# Patient Record
Sex: Male | Born: 1940 | Race: White | Hispanic: No | Marital: Married | Smoking: Former smoker
Health system: Southern US, Community
[De-identification: ages and names within clinical notes are randomized; demographics above are authoritative.]

## PROBLEM LIST (undated history)

## (undated) DIAGNOSIS — I739 Peripheral vascular disease, unspecified: Secondary | ICD-10-CM

## (undated) DIAGNOSIS — G8929 Other chronic pain: Secondary | ICD-10-CM

## (undated) DIAGNOSIS — K219 Gastro-esophageal reflux disease without esophagitis: Secondary | ICD-10-CM

## (undated) DIAGNOSIS — I10 Essential (primary) hypertension: Secondary | ICD-10-CM

## (undated) DIAGNOSIS — R7303 Prediabetes: Secondary | ICD-10-CM

## (undated) DIAGNOSIS — E7800 Pure hypercholesterolemia, unspecified: Secondary | ICD-10-CM

## (undated) DIAGNOSIS — C679 Malignant neoplasm of bladder, unspecified: Secondary | ICD-10-CM

## (undated) DIAGNOSIS — T50905A Adverse effect of unspecified drugs, medicaments and biological substances, initial encounter: Secondary | ICD-10-CM

## (undated) DIAGNOSIS — K5909 Other constipation: Secondary | ICD-10-CM

## (undated) DIAGNOSIS — F419 Anxiety disorder, unspecified: Secondary | ICD-10-CM

## (undated) DIAGNOSIS — M159 Polyosteoarthritis, unspecified: Secondary | ICD-10-CM

## (undated) DIAGNOSIS — H269 Unspecified cataract: Secondary | ICD-10-CM

## (undated) DIAGNOSIS — D509 Iron deficiency anemia, unspecified: Secondary | ICD-10-CM

## (undated) DIAGNOSIS — M51369 Other intervertebral disc degeneration, lumbar region without mention of lumbar back pain or lower extremity pain: Secondary | ICD-10-CM

## (undated) DIAGNOSIS — J929 Pleural plaque without asbestos: Secondary | ICD-10-CM

## (undated) DIAGNOSIS — I2510 Atherosclerotic heart disease of native coronary artery without angina pectoris: Secondary | ICD-10-CM

## (undated) DIAGNOSIS — Z860101 Personal history of adenomatous and serrated colon polyps: Secondary | ICD-10-CM

## (undated) DIAGNOSIS — Z86010 Personal history of colonic polyps: Secondary | ICD-10-CM

## (undated) DIAGNOSIS — M5136 Other intervertebral disc degeneration, lumbar region: Secondary | ICD-10-CM

## (undated) DIAGNOSIS — M545 Low back pain, unspecified: Secondary | ICD-10-CM

## (undated) DIAGNOSIS — M79606 Pain in leg, unspecified: Secondary | ICD-10-CM

## (undated) DIAGNOSIS — R001 Bradycardia, unspecified: Secondary | ICD-10-CM

## (undated) DIAGNOSIS — R062 Wheezing: Secondary | ICD-10-CM

## (undated) DIAGNOSIS — C4490 Unspecified malignant neoplasm of skin, unspecified: Secondary | ICD-10-CM

## (undated) DIAGNOSIS — Z85828 Personal history of other malignant neoplasm of skin: Secondary | ICD-10-CM

## (undated) HISTORY — DX: Polyosteoarthritis, unspecified: M15.9

## (undated) HISTORY — DX: Anxiety disorder, unspecified: F41.9

## (undated) HISTORY — DX: Pleural plaque without asbestos: J92.9

## (undated) HISTORY — DX: Low back pain, unspecified: M54.50

## (undated) HISTORY — DX: Adverse effect of unspecified drugs, medicaments and biological substances, initial encounter: T50.905A

## (undated) HISTORY — DX: Atherosclerotic heart disease of native coronary artery without angina pectoris: I25.10

## (undated) HISTORY — DX: Personal history of other malignant neoplasm of skin: Z85.828

## (undated) HISTORY — PX: CARDIOVASCULAR STRESS TEST: SHX262

## (undated) HISTORY — PX: OTHER SURGICAL HISTORY: SHX169

## (undated) HISTORY — PX: COLONOSCOPY: SHX174

## (undated) HISTORY — DX: Other intervertebral disc degeneration, lumbar region without mention of lumbar back pain or lower extremity pain: M51.369

## (undated) HISTORY — DX: Iron deficiency anemia, unspecified: D50.9

## (undated) HISTORY — DX: Peripheral vascular disease, unspecified: I73.9

## (undated) HISTORY — DX: Wheezing: R06.2

## (undated) HISTORY — DX: Pure hypercholesterolemia, unspecified: E78.00

## (undated) HISTORY — DX: Other intervertebral disc degeneration, lumbar region: M51.36

## (undated) HISTORY — DX: Prediabetes: R73.03

## (undated) HISTORY — DX: Essential (primary) hypertension: I10

## (undated) HISTORY — DX: Bradycardia, unspecified: R00.1

## (undated) HISTORY — PX: CATARACT EXTRACTION, BILATERAL: SHX1313

## (undated) HISTORY — DX: Other constipation: K59.09

## (undated) HISTORY — DX: Unspecified malignant neoplasm of skin, unspecified: C44.90

## (undated) HISTORY — DX: Gastro-esophageal reflux disease without esophagitis: K21.9

## (undated) HISTORY — DX: Pain in leg, unspecified: M79.606

## (undated) HISTORY — DX: Low back pain: M54.5

## (undated) HISTORY — DX: Personal history of adenomatous and serrated colon polyps: Z86.0101

## (undated) HISTORY — DX: Other chronic pain: G89.29

## (undated) HISTORY — DX: Personal history of colonic polyps: Z86.010

---
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
## 2003-02-22 ENCOUNTER — Encounter: Admission: RE | Admit: 2003-02-22 | Discharge: 2003-02-22 | Payer: Self-pay | Admitting: Neurosurgery

## 2003-02-22 ENCOUNTER — Encounter: Payer: Self-pay | Admitting: Neurosurgery

## 2003-03-02 ENCOUNTER — Ambulatory Visit (HOSPITAL_COMMUNITY): Admission: RE | Admit: 2003-03-02 | Discharge: 2003-03-02 | Payer: Self-pay | Admitting: Neurosurgery

## 2003-03-02 ENCOUNTER — Encounter: Payer: Self-pay | Admitting: Neurosurgery

## 2003-03-09 ENCOUNTER — Encounter: Admission: RE | Admit: 2003-03-09 | Discharge: 2003-03-09 | Payer: Self-pay | Admitting: Neurosurgery

## 2003-03-09 ENCOUNTER — Encounter: Payer: Self-pay | Admitting: Neurosurgery

## 2003-03-13 ENCOUNTER — Encounter: Admission: RE | Admit: 2003-03-13 | Discharge: 2003-03-30 | Payer: Self-pay | Admitting: Neurosurgery

## 2004-05-05 ENCOUNTER — Emergency Department (HOSPITAL_COMMUNITY): Admission: EM | Admit: 2004-05-05 | Discharge: 2004-05-05 | Payer: Self-pay | Admitting: Emergency Medicine

## 2004-09-05 ENCOUNTER — Ambulatory Visit: Payer: Self-pay | Admitting: Pulmonary Disease

## 2004-11-06 ENCOUNTER — Encounter: Admission: RE | Admit: 2004-11-06 | Discharge: 2004-11-06 | Payer: Self-pay | Admitting: Neurosurgery

## 2005-02-27 ENCOUNTER — Ambulatory Visit: Payer: Self-pay | Admitting: Pulmonary Disease

## 2005-05-07 ENCOUNTER — Ambulatory Visit: Payer: Self-pay | Admitting: Pulmonary Disease

## 2005-05-25 ENCOUNTER — Ambulatory Visit: Payer: Self-pay | Admitting: Pulmonary Disease

## 2005-06-02 ENCOUNTER — Ambulatory Visit: Payer: Self-pay | Admitting: Cardiology

## 2005-12-22 ENCOUNTER — Ambulatory Visit: Payer: Self-pay | Admitting: Pulmonary Disease

## 2006-04-08 ENCOUNTER — Ambulatory Visit (HOSPITAL_COMMUNITY): Admission: RE | Admit: 2006-04-08 | Discharge: 2006-04-08 | Payer: Self-pay | Admitting: Otolaryngology

## 2006-04-08 ENCOUNTER — Encounter: Payer: Self-pay | Admitting: Vascular Surgery

## 2006-04-22 ENCOUNTER — Encounter: Admission: RE | Admit: 2006-04-22 | Discharge: 2006-04-22 | Payer: Self-pay | Admitting: Otolaryngology

## 2006-04-23 ENCOUNTER — Encounter: Admission: RE | Admit: 2006-04-23 | Discharge: 2006-04-23 | Payer: Self-pay | Admitting: Otolaryngology

## 2006-05-26 ENCOUNTER — Ambulatory Visit: Payer: Self-pay | Admitting: Pulmonary Disease

## 2007-05-27 ENCOUNTER — Ambulatory Visit: Payer: Self-pay | Admitting: Pulmonary Disease

## 2007-05-27 LAB — CONVERTED CEMR LAB
ALT: 19 units/L (ref 0–53)
AST: 25 units/L (ref 0–37)
Albumin: 4.6 g/dL (ref 3.5–5.2)
Alkaline Phosphatase: 51 units/L (ref 39–117)
BUN: 9 mg/dL (ref 6–23)
Basophils Absolute: 0 10*3/uL (ref 0.0–0.1)
Basophils Relative: 0.4 % (ref 0.0–1.0)
Bilirubin, Direct: 0.2 mg/dL (ref 0.0–0.3)
CO2: 34 meq/L — ABNORMAL HIGH (ref 19–32)
Calcium: 9.5 mg/dL (ref 8.4–10.5)
Chloride: 109 meq/L (ref 96–112)
Cholesterol: 124 mg/dL (ref 0–200)
Creatinine, Ser: 0.9 mg/dL (ref 0.4–1.5)
Eosinophils Absolute: 0.2 10*3/uL (ref 0.0–0.6)
Eosinophils Relative: 2.3 % (ref 0.0–5.0)
GFR calc Af Amer: 109 mL/min
GFR calc non Af Amer: 90 mL/min
Glucose, Bld: 92 mg/dL (ref 70–99)
HCT: 46.4 % (ref 39.0–52.0)
HDL: 40.8 mg/dL (ref >39.0)
Hemoglobin: 16.4 g/dL (ref 13.0–17.0)
LDL Cholesterol: 52 mg/dL (ref 0–99)
Lymphocytes Relative: 31.2 % (ref 12.0–46.0)
MCHC: 35.3 g/dL (ref 30.0–36.0)
MCV: 90 fL (ref 78.0–100.0)
Monocytes Absolute: 0.6 10*3/uL (ref 0.2–0.7)
Monocytes Relative: 6.7 % (ref 3.0–11.0)
Neutro Abs: 5.1 10*3/uL (ref 1.4–7.7)
Neutrophils Relative %: 59.4 % (ref 43.0–77.0)
PSA: 1.85 ng/mL (ref 0.10–4.00)
Platelets: 179 10*3/uL (ref 150–400)
Potassium: 4.4 meq/L (ref 3.5–5.1)
RBC: 5.15 M/uL (ref 4.22–5.81)
RDW: 12.6 % (ref 11.5–14.6)
Sodium: 148 meq/L — ABNORMAL HIGH (ref 135–145)
TSH: 2.44 microintl units/mL (ref 0.35–5.50)
Total Bilirubin: 1.2 mg/dL (ref 0.3–1.2)
Total CHOL/HDL Ratio: 3
Total Protein: 7 g/dL (ref 6.0–8.3)
Triglycerides: 155 mg/dL — ABNORMAL HIGH (ref 0–149)
VLDL: 31 mg/dL (ref 0–40)
WBC: 8.6 10*3/uL (ref 4.5–10.5)

## 2007-07-10 ENCOUNTER — Encounter: Admission: RE | Admit: 2007-07-10 | Discharge: 2007-07-10 | Payer: Self-pay | Admitting: Neurosurgery

## 2007-08-09 ENCOUNTER — Ambulatory Visit: Payer: Self-pay | Admitting: Pulmonary Disease

## 2007-08-19 ENCOUNTER — Encounter: Payer: Self-pay | Admitting: Pulmonary Disease

## 2007-11-17 ENCOUNTER — Encounter: Payer: Self-pay | Admitting: Pulmonary Disease

## 2008-03-29 DIAGNOSIS — E7800 Pure hypercholesterolemia, unspecified: Secondary | ICD-10-CM | POA: Insufficient documentation

## 2008-03-29 DIAGNOSIS — M545 Low back pain, unspecified: Secondary | ICD-10-CM | POA: Insufficient documentation

## 2008-03-29 DIAGNOSIS — D126 Benign neoplasm of colon, unspecified: Secondary | ICD-10-CM | POA: Insufficient documentation

## 2008-03-29 DIAGNOSIS — M109 Gout, unspecified: Secondary | ICD-10-CM | POA: Insufficient documentation

## 2008-03-29 DIAGNOSIS — F411 Generalized anxiety disorder: Secondary | ICD-10-CM | POA: Insufficient documentation

## 2008-03-29 DIAGNOSIS — I10 Essential (primary) hypertension: Secondary | ICD-10-CM | POA: Insufficient documentation

## 2008-03-29 DIAGNOSIS — IMO0002 Reserved for concepts with insufficient information to code with codable children: Secondary | ICD-10-CM | POA: Insufficient documentation

## 2008-03-29 HISTORY — DX: Benign neoplasm of colon, unspecified: D12.6

## 2008-04-26 ENCOUNTER — Telehealth: Payer: Self-pay | Admitting: Pulmonary Disease

## 2008-05-16 ENCOUNTER — Encounter: Payer: Self-pay | Admitting: Pulmonary Disease

## 2008-06-21 ENCOUNTER — Ambulatory Visit: Payer: Self-pay | Admitting: Pulmonary Disease

## 2008-06-21 DIAGNOSIS — H9190 Unspecified hearing loss, unspecified ear: Secondary | ICD-10-CM | POA: Insufficient documentation

## 2008-06-21 DIAGNOSIS — I739 Peripheral vascular disease, unspecified: Secondary | ICD-10-CM | POA: Insufficient documentation

## 2008-06-21 DIAGNOSIS — Z85828 Personal history of other malignant neoplasm of skin: Secondary | ICD-10-CM | POA: Insufficient documentation

## 2008-06-21 DIAGNOSIS — J309 Allergic rhinitis, unspecified: Secondary | ICD-10-CM | POA: Insufficient documentation

## 2008-06-21 LAB — CONVERTED CEMR LAB
ALT: 33 units/L (ref 0–53)
AST: 29 units/L (ref 0–37)
BUN: 8 mg/dL (ref 6–23)
Basophils Absolute: 0 10*3/uL (ref 0.0–0.1)
Bilirubin, Direct: 0.3 mg/dL (ref 0.0–0.3)
CO2: 28 meq/L (ref 19–32)
Calcium: 9.5 mg/dL (ref 8.4–10.5)
Chloride: 108 meq/L (ref 96–112)
Eosinophils Relative: 2.3 % (ref 0.0–5.0)
GFR calc Af Amer: 109 mL/min
GFR calc non Af Amer: 90 mL/min
HCT: 46.6 % (ref 39.0–52.0)
Lymphocytes Relative: 30.3 % (ref 12.0–46.0)
MCHC: 35.6 g/dL (ref 30.0–36.0)
MCV: 91.9 fL (ref 78.0–100.0)
Monocytes Absolute: 0.4 10*3/uL (ref 0.1–1.0)
Monocytes Relative: 4.8 % (ref 3.0–12.0)
Neutro Abs: 5 10*3/uL (ref 1.4–7.7)
Neutrophils Relative %: 62.1 % (ref 43.0–77.0)
RBC: 5.07 M/uL (ref 4.22–5.81)
TSH: 1.81 microintl units/mL (ref 0.35–5.50)
Total Bilirubin: 1.1 mg/dL (ref 0.3–1.2)
Total CHOL/HDL Ratio: 3.2
Total Protein: 7.1 g/dL (ref 6.0–8.3)
VLDL: 31 mg/dL (ref 0–40)
WBC: 8.1 10*3/uL (ref 4.5–10.5)

## 2008-08-01 ENCOUNTER — Telehealth (INDEPENDENT_AMBULATORY_CARE_PROVIDER_SITE_OTHER): Payer: Self-pay | Admitting: *Deleted

## 2008-08-20 ENCOUNTER — Ambulatory Visit: Payer: Self-pay | Admitting: Gastroenterology

## 2008-09-06 ENCOUNTER — Ambulatory Visit: Payer: Self-pay | Admitting: Gastroenterology

## 2008-11-12 ENCOUNTER — Ambulatory Visit: Payer: Self-pay | Admitting: Pulmonary Disease

## 2008-11-28 ENCOUNTER — Encounter: Payer: Self-pay | Admitting: Pulmonary Disease

## 2009-01-11 LAB — CONVERTED CEMR LAB: Triglycerides: 173 mg/dL — ABNORMAL HIGH (ref 0–149)

## 2009-01-18 ENCOUNTER — Encounter: Payer: Self-pay | Admitting: Pulmonary Disease

## 2009-05-20 ENCOUNTER — Encounter: Payer: Self-pay | Admitting: Pulmonary Disease

## 2009-06-20 ENCOUNTER — Ambulatory Visit: Payer: Self-pay | Admitting: Pulmonary Disease

## 2009-06-20 ENCOUNTER — Encounter: Payer: Self-pay | Admitting: Cardiology

## 2009-06-20 DIAGNOSIS — R079 Chest pain, unspecified: Secondary | ICD-10-CM | POA: Insufficient documentation

## 2009-06-24 ENCOUNTER — Ambulatory Visit: Payer: Self-pay | Admitting: Cardiology

## 2009-06-24 ENCOUNTER — Ambulatory Visit: Payer: Self-pay | Admitting: Pulmonary Disease

## 2009-06-24 ENCOUNTER — Telehealth: Payer: Self-pay | Admitting: Pulmonary Disease

## 2009-06-26 ENCOUNTER — Telehealth (INDEPENDENT_AMBULATORY_CARE_PROVIDER_SITE_OTHER): Payer: Self-pay | Admitting: Radiology

## 2009-06-27 ENCOUNTER — Encounter: Payer: Self-pay | Admitting: Cardiology

## 2009-06-27 ENCOUNTER — Ambulatory Visit: Payer: Self-pay

## 2009-06-27 LAB — CONVERTED CEMR LAB
AST: 20 units/L (ref 0–37)
Albumin: 4.4 g/dL (ref 3.5–5.2)
Basophils Absolute: 0 10*3/uL (ref 0.0–0.1)
Calcium: 9.3 mg/dL (ref 8.4–10.5)
Chloride: 110 meq/L (ref 96–112)
GFR calc non Af Amer: 89.61 mL/min (ref >60)
Glucose, Bld: 83 mg/dL (ref 70–99)
HCT: 46.4 % (ref 39.0–52.0)
HDL: 41.1 mg/dL (ref >39.00)
Hemoglobin: 16 g/dL (ref 13.0–17.0)
Lymphocytes Relative: 31 % (ref 12.0–46.0)
MCHC: 34.6 g/dL (ref 30.0–36.0)
MCV: 93.5 fL (ref 78.0–100.0)
Monocytes Absolute: 0.4 10*3/uL (ref 0.1–1.0)
Monocytes Relative: 5.7 % (ref 3.0–12.0)
Neutrophils Relative %: 61.1 % (ref 43.0–77.0)
PSA: 1.35 ng/mL (ref 0.10–4.00)
RBC: 4.96 M/uL (ref 4.22–5.81)
TSH: 2.01 microintl units/mL (ref 0.35–5.50)
Total Bilirubin: 0.9 mg/dL (ref 0.3–1.2)
Total Protein: 7 g/dL (ref 6.0–8.3)
VLDL: 32 mg/dL (ref 0.0–40.0)
WBC: 7.3 10*3/uL (ref 4.5–10.5)

## 2009-07-08 ENCOUNTER — Ambulatory Visit: Payer: Self-pay | Admitting: Cardiology

## 2009-07-15 LAB — CONVERTED CEMR LAB
CO2: 33 meq/L — ABNORMAL HIGH (ref 19–32)
Calcium: 9.3 mg/dL (ref 8.4–10.5)
Chloride: 103 meq/L (ref 96–112)
Creatinine, Ser: 1 mg/dL (ref 0.4–1.5)
Potassium: 4.2 meq/L (ref 3.5–5.1)

## 2009-07-19 ENCOUNTER — Ambulatory Visit: Payer: Self-pay | Admitting: Pulmonary Disease

## 2009-07-23 ENCOUNTER — Encounter (INDEPENDENT_AMBULATORY_CARE_PROVIDER_SITE_OTHER): Payer: Self-pay | Admitting: *Deleted

## 2009-07-24 ENCOUNTER — Ambulatory Visit: Payer: Self-pay | Admitting: Cardiology

## 2009-07-29 ENCOUNTER — Ambulatory Visit: Payer: Self-pay

## 2009-08-07 ENCOUNTER — Ambulatory Visit: Payer: Self-pay | Admitting: Cardiology

## 2009-08-08 LAB — CONVERTED CEMR LAB
Calcium: 9 mg/dL (ref 8.4–10.5)
Creatinine, Ser: 1 mg/dL (ref 0.4–1.5)
Glucose, Bld: 106 mg/dL — ABNORMAL HIGH (ref 70–99)
Potassium: 3.7 meq/L (ref 3.5–5.1)

## 2009-09-11 ENCOUNTER — Encounter: Payer: Self-pay | Admitting: Pulmonary Disease

## 2009-10-10 ENCOUNTER — Telehealth (INDEPENDENT_AMBULATORY_CARE_PROVIDER_SITE_OTHER): Payer: Self-pay | Admitting: *Deleted

## 2009-10-23 ENCOUNTER — Ambulatory Visit: Payer: Self-pay | Admitting: Pulmonary Disease

## 2009-11-05 ENCOUNTER — Telehealth: Payer: Self-pay | Admitting: Pulmonary Disease

## 2010-02-05 ENCOUNTER — Encounter: Payer: Self-pay | Admitting: Pulmonary Disease

## 2010-02-20 ENCOUNTER — Ambulatory Visit: Payer: Self-pay | Admitting: Pulmonary Disease

## 2010-02-24 LAB — CONVERTED CEMR LAB
AST: 27 units/L (ref 0–37)
Albumin: 4.4 g/dL (ref 3.5–5.2)
BUN: 13 mg/dL (ref 6–23)
CO2: 32 meq/L (ref 19–32)
Chloride: 104 meq/L (ref 96–112)
GFR calc non Af Amer: 61.21 mL/min (ref >60)
Glucose, Bld: 84 mg/dL (ref 70–99)
HDL: 44.7 mg/dL (ref >39.00)
LDL Cholesterol: 74 mg/dL (ref 0–99)
Potassium: 4.2 meq/L (ref 3.5–5.1)
Total Protein: 6.6 g/dL (ref 6.0–8.3)
Triglycerides: 183 mg/dL — ABNORMAL HIGH (ref 0.0–149.0)

## 2010-07-30 ENCOUNTER — Encounter: Payer: Self-pay | Admitting: Pulmonary Disease

## 2010-08-19 ENCOUNTER — Ambulatory Visit: Payer: Self-pay | Admitting: Pulmonary Disease

## 2010-08-19 ENCOUNTER — Telehealth (INDEPENDENT_AMBULATORY_CARE_PROVIDER_SITE_OTHER): Payer: Self-pay | Admitting: *Deleted

## 2010-08-25 LAB — CONVERTED CEMR LAB
ALT: 38 units/L (ref 0–53)
Albumin: 4.9 g/dL (ref 3.5–5.2)
BUN: 23 mg/dL (ref 6–23)
Basophils Absolute: 0.1 10*3/uL (ref 0.0–0.1)
Creatinine, Ser: 1 mg/dL (ref 0.4–1.5)
Eosinophils Absolute: 0 10*3/uL (ref 0.0–0.7)
Eosinophils Relative: 0.1 % (ref 0.0–5.0)
HCT: 46.1 % (ref 39.0–52.0)
Hemoglobin: 15.8 g/dL (ref 13.0–17.0)
Iron: 200 ug/dL — ABNORMAL HIGH (ref 42–165)
Lymphocytes Relative: 24 % (ref 12.0–46.0)
Lymphs Abs: 3.2 10*3/uL (ref 0.7–4.0)
MCHC: 34.3 g/dL (ref 30.0–36.0)
Monocytes Absolute: 0.7 10*3/uL (ref 0.1–1.0)
Neutro Abs: 9.3 10*3/uL — ABNORMAL HIGH (ref 1.4–7.7)
PSA: 2.13 ng/mL (ref 0.10–4.00)
Potassium: 4.2 meq/L (ref 3.5–5.1)
RDW: 13.4 % (ref 11.5–14.6)
Saturation Ratios: 44.7 % (ref 20.0–50.0)
Transferrin: 319.6 mg/dL (ref 212.0–360.0)

## 2010-08-29 ENCOUNTER — Encounter: Payer: Self-pay | Admitting: Pulmonary Disease

## 2010-08-29 DIAGNOSIS — K219 Gastro-esophageal reflux disease without esophagitis: Secondary | ICD-10-CM | POA: Insufficient documentation

## 2010-10-19 ENCOUNTER — Encounter: Payer: Self-pay | Admitting: Otolaryngology

## 2010-10-30 NOTE — Assessment & Plan Note (Signed)
Summary: 3-4 months/.apc   Primary Care Provider:  Alroy Dust, MD  CC:  4 month ROV & review of mult medical problems....  History of Present Illness: 70 y/o WM here for a follow up visit... he has multiple medical problems as noted below...    ~  June 20, 2009:  pt last seen 12yr ago for f/u HBP, Chol, vasc dis- on Atenolol, Diovan, changed to Simvastatin & ASA... his main prob has been LBP- followed by DrHirsh & treated w/ epid shots, Diclofenac, Tramadol, Cyclobenz... pt presents w/ 1d hx of intermittent sharp electric shock-like CP from right to left across his chest... the pain occurs in bursts of 2-3, lasting seconds only & resolves spont... the pain is not activity or motion related, no radiation to jaw or arm, no assoc n/v/diaphoresis, etc... he tried Tums + his other meds noted... he notes some assoc weakness, & sl headache... he denies similar pain in the past & RISK FACTORS include: +male/ age68, +FamHx, +HBP, +Chol, Ex-smoker quit 2000, -DM...  NOTE: known calcif seen prev in coronaries on CTChest 2006, and calcif in the carotid bifurcations on CTNeck in 2007...   ~  July 19, 2009:  he saw [facility] 9[facility]10 w/ atyp CP- myoview done and neg- BP was sl elevated and Diovan increased... BP improved, Chol stable on Simva40, recent CXR/ Labs reviewed... OK Pneumovax.   ~  October 23, 2009:  he had incr LBP- saw DrHirsh w/ epid steroid shot & improved... also has Diclofenac, Vicodin, Flexeril...  saw DrMcLean 10/10 for f/u CP, HBP, Chol- added HCTZ25mg /d...    Current Problem List:  HEARING LOSS (ICD-389.9) - eval by DrBates for ENT...  ALLERGIC RHINITIS (ICD-477.9) - eval by DrESL on allergy shots 2x/wk... & FLONASE as needed.  HYPERTENSION (ICD-401.9) - controlled on ATENOLOL 50mg /d,  DIOVAN 320mg /d, & HCTZ 25mg /d... BP= 128/80 today, takes meds regularly, tolerates well... states BP checks at home are similar to this... denies HA, fatigue, visual changes, CP, palipit, dizziness,  syncope, dyspnea, edema, etc...  ~  note: coronary art calcif seen on prev CT Chest in 2006...  ~  CXR 9/10 showed chr asymmetric pl thickening right apex, calcif Ao, NAD...  CHEST PAIN (ICD-786.50) - Apypical CP eval 9/10...  ~  baseline EKG shows NSR, WNL.Marland Kitchen.  ~  Cards eval DrMcLean 9/10 was neg... BP sl elevated & Diovan increased.  ~  Myoview 9/10 showed no ischemia, no scar, EF= 58%...  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 he has known atherosclerotic changes seen on prev CT Neck 7/07 in the carotids w/o focal stenosis... note: coronary art calcif seen on prev CT Chest in 2006...  HYPERCHOLESTEROLEMIA (ICD-272.0) - prev on Vytorin 10/20 & switched to SIMVASTATIN 40mg /d in 2009 for $$ reasons...   ~  FLP 8/08 on Vytor10-20 showed TChol 124, TG 155, HDL 41, LDL 52  ~  FLP 9/09 on Vytor10-20 showed TChol 132, TG 154, HDL 41, LDL 60  ~  FLP 2/10 on Simva40 showed TChol 146, TG 173, HDL 36, LDL 75  ~  FLP 9/10 on Simva40 showed TChol 137, TG 160, HDL 41, LDL 64  COLONIC POLYPS (ICD-211.3)  ~  colonoscopy 10/04 by Dorris Singh showed several 1-5mm polyps = hyperplastic & one frag adenomatous...   ~  f/u colon 12/09 was neg- no polyps seen & f/u rec 50yrs...  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 he has degen disc disease  at all levels of the lumbar spine... s/p shots, no surgery... currently taking DICLOFENAC 75mg Bid,  VICODIN Tid Prn, & CYCLOBENZAPRINE 10mg Bid Prn...  ~  Fall10:  he has seen DrHirsh every few months & received several epid shots w/ temp relief...  GOUT (ICD-274.9) - on ALLOPURINOL 300mg /d...   ~  labs 9/09 showed UricAcid level= 4.7  ANXIETY (ICD-300.00)  BASAL CELL CARCINOMA, HX OF (ICD-V10.83) - s/p removal of 2 basal cell Ca's from neck and left arm by DrTaffeen...  HEALTH MAINTENANCE:    ~  colon- done by drKaplan 12/09 & neg- f/u 70yrs.  ~  GU- PSA 9/10 was normal at  1.35  ~  Immunization- OK PNEUMOVAX 10/10 age 18... OK 2010 Flu shot today as well.   Allergies (verified): No Known Drug Allergies  Comments:  Nurse/Medical Assistant: The patient's medications and allergies were reviewed with the patient and were updated in the Medication and Allergy Lists.  Past History:  Past Medical History: 1. CHEST PAIN (ICD-786.50): Coronary calcification noted on CT chest in 2006. Adenosine myoview (10/10): EF 58%, no evidence for ischemia or infarction.  2. HYPERTENSION (ICD-401.9) 3. HYPERCHOLESTEROLEMIA (ICD-272.0) 4. ANXIETY (ICD-300.00) 5. HEARING LOSS (ICD-389.9) 6. ALLERGIC RHINITIS (ICD-477.9) 8. COLONIC POLYPS (ICD-211.3) 9. 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 10. 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 BASAL CELL CARCINOMA, HX OF (ICD-V10.83)  Family History: Reviewed history from 06/21/2009 and no changes required. Father died age 65 w/ lung cancer Mother died age 22 w/ CHF 3 Siblings:   2 Brothers- one had CABG age 36 1 Sister- hx breast cancer  Social History: Reviewed history from 06/21/2009 and no changes required. Married 3 children Ex-smoker: quit 2000 after 35 yrs up to 2ppd Quit alcohol 1994 +caffeine 2-3 cups/day Retired  Review of Systems      See HPI       The patient complains of difficulty walking.  The patient denies anorexia, fever, weight loss, weight gain, vision loss, decreased hearing, hoarseness, chest pain, syncope, dyspnea on exertion, peripheral edema, prolonged cough, headaches, hemoptysis, abdominal pain, melena, hematochezia, severe indigestion/heartburn, hematuria, incontinence, muscle weakness, suspicious skin lesions, transient blindness, depression, unusual weight change, abnormal bleeding, enlarged lymph nodes, and angioedema.    Vital Signs:  Patient profile:   70 year old male Height:      74 inches Weight:      230.25 pounds O2 Sat:      96 % on Room air Temp:     98.2 degrees F  oral Pulse rate:   74 / minute BP sitting:   128 / 80  (left arm) Cuff size:   regular  Vitals Entered By: Randell Loop CMA (October 23, 2009 12:02 PM)  O2 Sat at Rest %:  98 O2 Flow:  Room air CC: 4 month ROV & review of mult medical problems... Comments meds updated today   Physical Exam  Additional Exam:  WD, WN, 70 y/o WM in NAD... GENERAL:  Alert & oriented; pleasant & cooperative... HEENT:  [place]/AT, EOM-wnl, PERRLA, EACs-clear, TMs-wnl, NOSE-clear, THROAT-clear & wnl. NECK:  Supple w/ fairROM; no JVD; normal carotid impulses w/o bruits; no thyromegaly or nodules palpated; no lymphadenopathy. CHEST:  Clear to P & A; without wheezes/ rales/ or rhonchi heard... min tender chest wall... HEART:  Regular Rhythm; faint gr1/6 SEM apex, no rubs or gallops detected... ABDOMEN:  Soft & nontender; normal bowel sounds; no organomegaly or masses palpated. EXT: without deformities, mod arthritic changes; no varicose veins/+ venous insuffic/ no edema.  NEURO:  CN's intact; no focal neuro deficits; SLR restricted w/ pain... DERM:  No lesions noted; no rash etc...     Impression & Recommendations:  Problem # 1:  HYPERTENSION (ICD-401.9) Controlled-  continue same meds... His updated medication list for this problem includes:    Atenolol 50 Mg Tabs (Atenolol) .Marland Kitchen... Take 1 tablet by mouth once a day    Diovan 320 Mg Tabs (Valsartan) .Marland Kitchen... Take 1 tab by mouth once daily...    Hydrochlorothiazide 25 Mg Tabs (Hydrochlorothiazide) .Marland Kitchen... Take 1 tab by mouth once daily in am...  Problem # 2:  CHEST PAIN (ICD-786.50) Atypic CP-  eval by Cards- neg...  Problem # 3:  HYPERCHOLESTEROLEMIA (ICD-272.0) Stable on the Simva40... His updated medication list for this problem includes:    Simvastatin 40 Mg Tabs (Simvastatin) .Marland Kitchen... Take 1 tab by mouth at bedtime...  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 continue meds.  Complete Medication List: 1)  Flonase 50  Mcg/act Susp (Fluticasone propionate) .... Use 2 sprays in each nostril once daily 2)  Loratadine 10 Mg Tabs (Loratadine) .Marland Kitchen.. 1-2 tabs by mouth once daily 3)  Aspirin Adult Low Strength 81 Mg Tbec (Aspirin) .... Take 1 tablet by mouth once a day 4)  Atenolol 50 Mg Tabs (Atenolol) .... Take 1 tablet by mouth once a day 5)  Diovan 320 Mg Tabs (Valsartan) .... Take 1 tab by mouth once daily.Marland KitchenMarland Kitchen 6)  Hydrochlorothiazide 25 Mg Tabs (Hydrochlorothiazide) .... Take 1 tab by mouth once daily in am... 7)  Simvastatin 40 Mg Tabs (Simvastatin) .... Take 1 tab by mouth at bedtime.Marland KitchenMarland Kitchen 8)  Omeprazole 20 Mg Cpdr (Omeprazole) .... Take 1 tab by mouth two times a day- 30 min before breakfast & dinner... 9)  Allopurinol 300 Mg Tabs (Allopurinol) .... Take 1 tablet by mouth once a day 10)  Vicodin 5-500 Mg Tabs (Hydrocodone-acetaminophen) .... Take 1 tab by mouth every 6 h as needed for pain... 11)  Diclofenac Sodium 75 Mg Tbec (Diclofenac sodium) .... 2 by mouth once daily 12)  Tramadol Hcl 50 Mg Tabs (Tramadol hcl) .... Take 1 tab by mouth 4 times daily as needed for pain... 13)  Cyclobenzaprine Hcl 10 Mg Tabs (Cyclobenzaprine hcl) .... 2 tabs by mouth once daily 14)  Lotrisone 1-0.05 % Crea (Clotrimazole-betamethasone) .... Apply as directed  Other Orders: Prescription Created Electronically [phone])  Patient Instructions: 1)  Today we updated your med list- see below.... 2)  We wrote a new perscription for your HCTZ... 3)  Continue your current meds the same... 4)  Let's get on a good diet + exercise program... the goal is to lose some weight... 5)  Call for any problems.Marland KitchenMarland Kitchen 6)  Please schedule a follow-up appointment in 4 months, & we will plan FASTING blood work at that time... Prescriptions: HYDROCHLOROTHIAZIDE 25 MG TABS (HYDROCHLOROTHIAZIDE) take 1 tab by mouth once daily in AM...  #100 x prn   Entered and Authorized by:   Michele Mcalpine MD   Signed by:   Michele Mcalpine MD on 10/23/2009   Method used:    Print then Give to Patient   RxID:   [id number]

## 2010-10-30 NOTE — Assessment & Plan Note (Signed)
Summary: rov 6 months///kp   Primary Care Provider:  Alroy Dust, MD  CC:  6 month ROV & review of mult medical problems....  History of Present Illness: 70 y/o WM here for a follow up visit... he has multiple medical problems as noted below...    ~  Sep10:  pt last seen 20yr ago for f/u HBP, Chol, vasc dis- on Atenolol, Diovan, changed to Simvastatin & ASA... his main prob has been LBP- followed by DrHirsh & treated w/ epid shots, Diclofenac, Tramadol, Cyclobenz... pt presents w/ 1d hx of intermittent sharp electric shock-like CP from right to left across his chest... the pain occurs in bursts of 2-3, lasting seconds only & resolves spont... the pain is not activity or motion related, no radiation to jaw or arm, no assoc n/v/diaphoresis, etc... he tried Tums + his other meds noted... he notes some assoc weakness, & sl headache... he denies similar pain in the past & RISK FACTORS include: +male/ age68, +FamHx, +HBP, +Chol, Ex-smoker quit 2000, -DM...  NOTE: known calcif seen prev in coronaries on CTChest 2006, and calcif in the carotid bifurcations on CTNeck in 2007...  ~  Oct10:  he saw DrMcLean 9/10 w/ atyp CP- myoview done and neg- BP was sl elevated and Diovan increased... BP improved, Chol stable on Simva40, recent CXR/ Labs reviewed... OK Pneumovax.   ~  October 23, 2009:  he had incr LBP- saw DrHirsh w/ epid steroid shot & improved... also has Diclofenac, Vicodin, Flexeril...  saw DrMcLean 10/10 for f/u CP, HBP, Chol- added HCTZ25mg /d...   ~  Feb 20, 2010:  improved overall- but still having back problems eval by DrHirsh w/ rec for incr exercise & try to avoid surg since it would be extensive... BP is controlled on meds;  no further chest discomfort noted;  Chol looks good on Simva40;  needs TDAP today...   ~  August 19, 2010:  60mo ROV & stable- persist back discomfort followed by DrHirsh for Neurosurg;  BP controlled on meds;  denies CP, palpit, ch in SOB, etc;  Chol Ok on Simva40 + diet;   GI stable on meds;  he requests med refills today...    Current Problem List:  HEARING LOSS (ICD-389.9) - eval by DrBates for ENT...  ALLERGIC RHINITIS (ICD-477.9) - eval by DrESL on allergy shots 2x/wk... & FLONASE as needed.  HYPERTENSION (ICD-401.9) - controlled on ATENOLOL 50mg /d,  DIOVAN 320mg /d, & HCTZ 25mg /d... BP= 126/82 today, takes meds regularly, tolerates well... states BP checks at home are similar to this... denies HA, fatigue, visual changes, CP, palipit, dizziness, syncope, dyspnea, edema, etc...  ~  note: coronary art calcif seen on prev CT Chest in 2006...  ~  CXR 9/10 showed chr asymmetric pl thickening right apex, calcif Ao, NAD...  CHEST PAIN (ICD-786.50) - Apypical CP eval 9/10...  ~  baseline EKG shows NSR, WNL.Marland Kitchen.  ~  Cards eval DrMcLean 9/10 was neg... BP sl elevated & Diovan increased.  ~  Myoview 9/10 showed no ischemia, no scar, EF= 58%...  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 he has known atherosclerotic changes seen on prev CT Neck 7/07 in the carotids w/o focal stenosis... note: coronary art calcif seen on prev CT Chest in 2006...  HYPERCHOLESTEROLEMIA (ICD-272.0) - prev on Vytorin 10/20 & switched to SIMVASTATIN 40mg /d in 2009 for $$ reasons... he knows to follow a low chol/ low fat diet & work on weight reduction...  ~  FLP 8/08 on Vytor10-20 showed TChol  124, TG 155, HDL 41, LDL 52  ~  FLP 9/09 on Vytor10-20 showed TChol 132, TG 154, HDL 41, LDL 60  ~  FLP 2/10 on Simva40 showed TChol 146, TG 173, HDL 36, LDL 75  ~  FLP 9/10 on Simva40 showed TChol 137, TG 160, HDL 41, LDL 64  ~  FLP 5/11 on Simva40 showed TChol 155, TG 183, HDL 45, LDL 74  GERD (ICD-530.81) - on OMEPRAZOLE 20mg  Bid...  COLONIC POLYPS (ICD-211.3)  ~  colonoscopy 10/04 by Dorris Singh showed several 1-43mm polyps = hyperplastic & one frag adenomatous...   ~  f/u colon 12/09 was neg- no polyps seen & f/u rec 23yrs...  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 s/p shots, no surgery... currently taking DICLOFENAC 75mg Bid,  VICODIN Tid Prn, & CYCLOBENZAPRINE 10mg Bid Prn...  ~  Fall10:  he has seen DrHirsh every few months & received several epid shots w/ temp relief...  ~  11/11:  he's had 2 more epid steroid shots from DrHirsh & reports min relief... he continues to get his pain meds from [place]... he requests TRAMADOL instead of Diclofenac... we discussed poss pain clinic referral.  GOUT (ICD-274.9) - on ALLOPURINOL 300mg /d...   ~  labs 9/09 showed UricAcid level= 4.7  ANXIETY (ICD-300.00)  BASAL CELL CARCINOMA, HX OF (ICD-V10.83) - s/p removal of 2 basal cell Ca's from neck and left arm by DrTaffeen...  HEALTH MAINTENANCE:    ~  colon- done by drKaplan 12/09 & neg- f/u 41yrs.  ~  GU- PSA 9/10 was normal at 1.35  ~  Immunization- had PNEUMOVAX 10/10 age 23... had 2010 Flu shot as well... OK TDAP today 5/11...   Preventive Screening-Counseling & Management  Alcohol-Tobacco     Smoking Status: quit     Packs/Day: 2.0     Year Quit: 2000  Allergies (verified): No Known Drug Allergies  Comments:  Nurse/Medical Assistant: The patient's medications and allergies were reviewed with the patient and were updated in the Medication and Allergy Lists.  Past History:  Past Medical History: 1. CHEST PAIN (ICD-786.50): Coronary calcification noted on CT chest in 2006. Adenosine myoview (10/10): EF 58%, no evidence for ischemia or infarction.  2. HYPERTENSION (ICD-401.9) 3. HYPERCHOLESTEROLEMIA (ICD-272.0)  HEARING LOSS (ICD-389.9) ALLERGIC RHINITIS (ICD-477.9) CHEST PAIN (ICD-786.50) HYPERTENSION (ICD-401.9) PERIPHERAL VASCULAR DISEASE (ICD-443.9) HYPERCHOLESTEROLEMIA (ICD-272.0) GERD (ICD-530.81) COLONIC POLYPS (ICD-211.3) DEGENERATIVE DISC DISEASE (ICD-722.6) GOUT (ICD-274.9) BACK PAIN, LUMBAR (ICD-724.2) ANXIETY  (ICD-300.00) BASAL CELL CARCINOMA, HX OF (ICD-V10.83)  Past Surgical History: back injection oct 2011 and november 2011  Family History: Reviewed history from 06/21/2009 and no changes required. Father died age 6 w/ lung cancer Mother died age 29 w/ CHF 3 Siblings:   2 Brothers- one had CABG age 107 1 Sister- hx breast cancer  Social History: Reviewed history from 06/21/2009 and no changes required. Married 3 children Ex-smoker: quit 2000 after 35 yrs up to 2ppd Quit alcohol 1994 +caffeine 2-3 cups/day Retired Packs/Day:  2.0  Review of Systems      See HPI       The patient complains of decreased hearing, dyspnea on exertion, and difficulty walking.  The patient denies anorexia, fever, weight loss, weight gain, vision loss, hoarseness, chest pain, syncope, peripheral edema, prolonged cough, headaches, hemoptysis, abdominal pain, melena, hematochezia, severe indigestion/heartburn, hematuria, incontinence, muscle weakness, suspicious skin lesions, transient blindness, depression, unusual  weight change, abnormal bleeding, enlarged lymph nodes, and angioedema.    Vital Signs:  Patient profile:   70 year old male Height:      74 inches Weight:      218.50 pounds BMI:     28.16 O2 Sat:      96 % on Room air Temp:     97.8 degrees F oral Pulse rate:   87 / minute BP sitting:   126 / 82  (left arm) Cuff size:   regular  Vitals Entered By: Randell Loop CMA (August 19, 2010 10:53 AM)  O2 Sat at Rest %:  96 O2 Flow:  Room air CC: 6 month ROV & review of mult medical problems... Is Patient Diabetic? No Pain Assessment Patient in pain? yes      Onset of pain  back pain all the time Comments meds updated today with pt    Physical Exam  Additional Exam:  WD, WN, 70 y/o WM in NAD... GENERAL:  Alert & oriented; pleasant & cooperative... HEENT:  [place]/AT, EOM-wnl, PERRLA, EACs-clear, TMs-wnl, NOSE-clear, THROAT-clear & wnl. NECK:  Supple w/ fairROM; no JVD; normal carotid  impulses w/o bruits; no thyromegaly or nodules palpated; no lymphadenopathy. CHEST:  Clear to P & A; without wheezes/ rales/ or rhonchi heard... min tender chest wall... HEART:  Regular Rhythm; faint gr1/6 SEM apex, no rubs or gallops detected... ABDOMEN:  Soft & nontender; normal bowel sounds; no organomegaly or masses palpated. EXT: without deformities, mod arthritic changes; no varicose veins/+ venous insuffic/ no edema. NEURO:  CN's intact; no focal neuro deficits; SLR restricted w/ pain... DERM:  No lesions noted; no rash etc...    MISC. Report  Procedure date:  08/19/2010  Findings:      BMP (METABOL)   Sodium                    140 mEq/L                   135-145   Potassium                 4.2 mEq/L                   3.5-5.1   Chloride                  101 mEq/L                   96-112   Carbon Dioxide            32 mEq/L                    19-32   Glucose                   99 mg/dL                    16-10   BUN                       23 mg/dL                    9-60   Creatinine                1.0 mg/dL                   4.5-4.0   Calcium  10.0 mg/dL                  0.9-32.3   GFR                       77.29 mL/min                >60  Hepatic/Liver Function Panel (HEPATIC)   Total Bilirubin           0.8 mg/dL                   5.5-7.3   Direct Bilirubin          0.1 mg/dL                   2.2-0.2   Alkaline Phosphatase      43 U/L                      39-117   AST                       23 U/L                      0-37   ALT                       38 U/L                      0-53   Total Protein             7.2 g/dL                    5.4-2.7   Albumin                   4.9 g/dL                    0.6-2.3  CBC Platelet w/Diff (CBCD)   White Cell Count     [H]  13.3 K/uL                   4.5-10.5   Red Cell Count            4.76 Mil/uL                 4.22-5.81   Hemoglobin                15.8 g/dL                   76.2-83.1   Hematocrit                 46.1 %                      39.0-52.0   MCV                       96.8 fl                     78.0-100.0   Platelet Count            223.0 K/uL                  150.0-400.0   Neutrophil %  69.8 %                      43.0-77.0   Lymphocyte %              24.0 %                      12.0-46.0   Monocyte %                5.6 %                       3.0-12.0   Eosinophils%              0.1 %                       0.0-5.0   Basophils %               0.5 %                       0.0-3.0  Comments:      IBC Panel (IBC)   Iron                 [H]  200 ug/dL                   81-191   Transferrin               319.6 mg/dL                 478.2-956.2   Iron Saturation           44.7 %                      20.0-50.0   TSH (TSH)   FastTSH                   0.90 uIU/mL                 0.35-5.50  Prostate Specific Antigen (PSA)   PSA-Hyb                   2.13 ng/mL                  0.10-4.00   Impression & Recommendations:  Problem # 1:  HYPERTENSION (ICD-401.9) Controlled>  same meds. His updated medication list for this problem includes:    Atenolol 50 Mg Tabs (Atenolol) .Marland Kitchen... Take 1 tablet by mouth once a day    Diovan 320 Mg Tabs (Valsartan) .Marland Kitchen... Take 1 tab by mouth once daily...    Hydrochlorothiazide 25 Mg Tabs (Hydrochlorothiazide) .Marland Kitchen... Take 1 tab by mouth once daily in am...  Orders: TLB-Hepatic/Liver Function Pnl (80076-HEPATIC) TLB-CBC Platelet - w/Differential (85025-CBCD) TLB-IBC Pnl (Iron/FE;Transferrin) (83550-IBC) TLB-TSH (Thyroid Stimulating Hormone) (84443-TSH) TLB-PSA (Prostate Specific Antigen) (84153-PSA)  Problem # 2:  CHEST PAIN (ICD-786.50) He denies CP, palpit, ch in SOB, etc...  Problem # 3:  HYPERCHOLESTEROLEMIA (ICD-272.0) Stable on Simva40... His updated medication list for this problem includes:    Simvastatin 40 Mg Tabs (Simvastatin) .Marland Kitchen... Take 1 tab by mouth at bedtime...  Problem # 4:  GERD (ICD-530.81) Stable on the OMEP Bid... His  updated medication list for this problem includes:    Omeprazole 20 Mg Cpdr (Omeprazole) .Marland Kitchen... Take 1 tab by mouth two times a day- 30 min  before breakfast & dinner...  Problem # 5:  BACK PAIN, LUMBAR (ICD-724.2) His CC is back pain & followed by DrHirsh as noted... The following medications were removed from the medication list:    Diclofenac Sodium 75 Mg Tbec (Diclofenac sodium) .Marland Kitchen... 2 by mouth once daily His updated medication list for this problem includes:    Aspirin Adult Low Strength 81 Mg Tbec (Aspirin) .Marland Kitchen... Take 1 tablet by mouth once a day    Vicodin 5-500 Mg Tabs (Hydrocodone-acetaminophen) .Marland Kitchen... Take 1 tab by mouth every 6 h as needed for pain...    Tramadol Hcl 50 Mg Tabs (Tramadol hcl) .Marland Kitchen... Take 1 tab by mouth every 6 h as needed for pain...    Cyclobenzaprine Hcl 10 Mg Tabs (Cyclobenzaprine hcl) .Marland Kitchen... Take 1 tab by mouth three times a day as needed muscle spasm...  Problem # 6:  OTHER MEDICAL PROBLEMS AS NOTED>>> OK refills...   Complete Medication List: 1)  Loratadine 10 Mg Tabs (Loratadine) .Marland Kitchen.. 1-2 tabs by mouth once daily 2)  Flonase 50 Mcg/act Susp (Fluticasone propionate) .... Use 2 sprays in each nostril once daily 3)  Aspirin Adult Low Strength 81 Mg Tbec (Aspirin) .... Take 1 tablet by mouth once a day 4)  Atenolol 50 Mg Tabs (Atenolol) .... Take 1 tablet by mouth once a day 5)  Diovan 320 Mg Tabs (Valsartan) .... Take 1 tab by mouth once daily.Marland KitchenMarland Kitchen 6)  Hydrochlorothiazide 25 Mg Tabs (Hydrochlorothiazide) .... Take 1 tab by mouth once daily in am... 7)  Simvastatin 40 Mg Tabs (Simvastatin) .... Take 1 tab by mouth at bedtime.Marland KitchenMarland Kitchen 8)  Omeprazole 20 Mg Cpdr (Omeprazole) .... Take 1 tab by mouth two times a day- 30 min before breakfast & dinner... 9)  Allopurinol 300 Mg Tabs (Allopurinol) .... Take 1 tablet by mouth once a day 10)  Vicodin 5-500 Mg Tabs (Hydrocodone-acetaminophen) .... Take 1 tab by mouth every 6 h as needed for pain... 11)  Tramadol Hcl 50 Mg Tabs  (Tramadol hcl) .... Take 1 tab by mouth every 6 h as needed for pain... 12)  Cyclobenzaprine Hcl 10 Mg Tabs (Cyclobenzaprine hcl) .... Take 1 tab by mouth three times a day as needed muscle spasm... 13)  Lotrisone 1-0.05 % Crea (Clotrimazole-betamethasone) .... Apply as directed  Other Orders: TLB-BMP (Basic Metabolic Panel-BMET) (80048-METABOL)  Patient Instructions: 1)  Today we updated your med list- see below.... 2)  We refilled your meds per request & wrote a new perscription for TRAMADOL.Marland KitchenMarland Kitchen 3)  Today we did your f/u fasting blood work... please call the "phone tree" in a few days for your lab results.Marland KitchenMarland Kitchen 4)  Stay as active as poss... 5)  Call for any questions, or if we may be of service to you... 6)  Please schedule a follow-up appointment in 6 months. Prescriptions: TRAMADOL HCL 50 MG TABS (TRAMADOL HCL) take 1 tab by mouth every 6 H as needed for pain...  #100 x 6   Entered and Authorized by:   Michele Mcalpine MD   Signed by:   Michele Mcalpine MD on 08/19/2010   Method used:   Print then Give to Patient   RxID:   [id number] CYCLOBENZAPRINE HCL 10 MG TABS (CYCLOBENZAPRINE HCL) take 1 tab by mouth three times a day as needed muscle spasm...  #90 x 12   Entered and Authorized by:   Michele Mcalpine MD   Signed by:   Michele Mcalpine MD on 08/19/2010   Method used:  Print then Give to Patient   RxID:   [id number] VICODIN 5-500 MG TABS (HYDROCODONE-ACETAMINOPHEN) take 1 tab by mouth every 6 H as needed for pain...  #100 x 6   Entered and Authorized by:   Michele Mcalpine MD   Signed by:   Michele Mcalpine MD on 08/19/2010   Method used:   Print then Give to Patient   RxID:   [id number] ALLOPURINOL 300 MG TABS (ALLOPURINOL) Take 1 tablet by mouth once a day  #90 x 4   Entered and Authorized by:   Michele Mcalpine MD   Signed by:   Michele Mcalpine MD on 08/19/2010   Method used:   Print then Give to Patient   RxID:   [id number] OMEPRAZOLE 20 MG CPDR (OMEPRAZOLE) take 1 tab by  mouth two times a day- 30 min before breakfast & dinner...  #180 x 4   Entered and Authorized by:   Michele Mcalpine MD   Signed by:   Michele Mcalpine MD on 08/19/2010   Method used:   Print then Give to Patient   RxID:   [id number] SIMVASTATIN 40 MG TABS (SIMVASTATIN) take 1 tab by mouth at bedtime...  #90 x 4   Entered and Authorized by:   Michele Mcalpine MD   Signed by:   Michele Mcalpine MD on 08/19/2010   Method used:   Electronically to        [facility] # [phone]* (retail)       [address]       [place], [place]  [address]       Ph: [phone]       Fax: [phone]   RxID:   [id number] DIOVAN 320 MG TABS (VALSARTAN) take 1 tab by mouth once daily...  #90 x 4   Entered and Authorized by:   Michele Mcalpine MD   Signed by:   Michele Mcalpine MD on 08/19/2010   Method used:   Electronically to        [facility] # [phone]* (retail)       [address]       [place], [place]  [address]       Ph: [phone]       Fax: [phone]   RxID:   [id number] ATENOLOL 50 MG  TABS (ATENOLOL) Take 1 tablet by mouth once a day  #90 x 4   Entered and Authorized by:   Michele Mcalpine MD   Signed by:   Michele Mcalpine MD on 08/19/2010   Method used:   Electronically to        [facility] # [phone]* (retail)       [address]       [place], [place]  [address]       Ph: [phone]       Fax: [phone]   RxID:   [id number]

## 2010-10-30 NOTE — Letter (Signed)
Summary: [facility]  [facility]   Imported By: Lester [place] 10/18/2009 09:42:55  _____________________________________________________________________  External Attachment:    Type:   Image     Comment:   External Document

## 2010-10-30 NOTE — Progress Notes (Signed)
Summary: REFILL  Phone Note Call from Patient   Caller: Patient Call For: NADEL Summary of Call: NEED 90 PRESCRIPT FOR DIOVAN SENT TO [facility] WITH 1 YEAR REFILL FAX TO [phone] Initial call taken by: Rickard Patience,  October 10, 2009 9:37 AM  Follow-up for Phone Call        [facility] Follow-up by: Philipp Deputy CMA,  October 10, 2009 9:53 AM    Prescriptions: DIOVAN 320 MG TABS (VALSARTAN) take 1 tab by mouth once daily...  #90 x 3   Entered by:   Philipp Deputy CMA   Authorized by:   Michele Mcalpine MD   Signed by:   Philipp Deputy CMA on 10/10/2009   Method used:   Electronically to        [facility]* (mail-order)       [address]       [place], [place]  [address]       Ph: [phone]       Fax: [phone]   RxID:   [id number]

## 2010-10-30 NOTE — Progress Notes (Signed)
Summary: rx - LMTCB x 1  Phone Note Call from Patient Call back at Home Phone [phone]   Caller: Patient Call For: nadel Reason for Call: Talk to Nurse Summary of Call: Patient needing 3 month supply rx of simvastation 40mg , divan 320mg , atenolol 50mg  faxed to [facility]. Initial call taken by: Lehman Prom,  August 19, 2010 12:11 PM  Follow-up for Phone Call        Pt had OV today with SN.  Rxs were sent electronically to [facility].  LMOMTCB to see if pt wanted the rxs at [facility] cancelled and faxed to [facility]. Gweneth Dimitri RN  August 19, 2010 12:24 PM   Additional Follow-up for Phone Call Additional follow up Details #1::        Spoke with pt.  He states that he needs the rxs for simvastatin, diovan, and atenolol to be canceled at [facility] and sent to [facility]. I called and spoke with pharmacist at [facility] and canceled these and sent them to [facility]. Additional Follow-up by: Vernie Murders,  August 19, 2010 2:47 PM    Prescriptions: SIMVASTATIN 40 MG TABS (SIMVASTATIN) take 1 tab by mouth at bedtime...  #90 x 4   Entered by:   Vernie Murders   Authorized by:   Michele Mcalpine MD   Signed by:   Vernie Murders on 08/19/2010   Method used:   Electronically to        [facility]* (mail-order)       [address]       [place], [place]  [address]       Ph: [phone]       Fax: [phone]   RxID:   [id number] DIOVAN 320 MG TABS (VALSARTAN) take 1 tab by mouth once daily...  #90 x 4   Entered by:   Vernie Murders   Authorized by:   Michele Mcalpine MD   Signed by:   Vernie Murders on 08/19/2010   Method used:   Electronically to        [facility]* (mail-order)       [address]       [place], [place]  [address]       Ph: [phone]       Fax: [phone]   RxID:   [id number] ATENOLOL 50 MG  TABS (ATENOLOL) Take 1 tablet by mouth once a day  #90 x 4   Entered  by:   Vernie Murders   Authorized by:   Michele Mcalpine MD   Signed by:   Vernie Murders on 08/19/2010   Method used:   Electronically to        [facility]* (mail-order)       [address]       [place],   [address]       Ph: [phone]       Fax: [phone]   RxID:   [id number]

## 2010-10-30 NOTE — Letter (Signed)
Summary: [facility]  [facility]   Imported By: Sherian Rein 03/17/2010 10:13:28  _____________________________________________________________________  External Attachment:    Type:   Image     Comment:   External Document

## 2010-10-30 NOTE — Assessment & Plan Note (Signed)
Summary: rov 4 months///kp   Primary Care Provider:  Alroy Dust, MD  CC:  4 month ROV & review of mult medical problems....  History of Present Illness: 70 y/o WM here for a follow up visit... he has multiple medical problems as noted below...    ~  Sep10:  pt last seen 13yr ago for f/u HBP, Chol, vasc dis- on Atenolol, Diovan, changed to Simvastatin & ASA... his main prob has been LBP- followed by DrHirsh & treated w/ epid shots, Diclofenac, Tramadol, Cyclobenz... pt presents w/ 1d hx of intermittent sharp electric shock-like CP from right to left across his chest... the pain occurs in bursts of 2-3, lasting seconds only & resolves spont... the pain is not activity or motion related, no radiation to jaw or arm, no assoc n/v/diaphoresis, etc... he tried Tums + his other meds noted... he notes some assoc weakness, & sl headache... he denies similar pain in the past & RISK FACTORS include: +male/ age68, +FamHx, +HBP, +Chol, Ex-smoker quit 2000, -DM...  NOTE: known calcif seen prev in coronaries on CTChest 2006, and calcif in the carotid bifurcations on CTNeck in 2007...  ~  Oct10:  he saw DrMcLean 9/10 w/ atyp CP- myoview done and neg- BP was sl elevated and Diovan increased... BP improved, Chol stable on Simva40, recent CXR/ Labs reviewed... OK Pneumovax.   ~  October 23, 2009:  he had incr LBP- saw DrHirsh w/ epid steroid shot & improved... also has Diclofenac, Vicodin, Flexeril...  saw DrMcLean 10/10 for f/u CP, HBP, Chol- added HCTZ25mg /d...   ~  Feb 20, 2010:  improved overall- but still having back problems eval by DrHirsh w/ rec for incr exercise & try to avoid surg since it would be extensive... BP is controlled on meds;  no further chest discomfort noted;  Chol looks good on Simva40;  needs TDAP today...    Current Problem List:  HEARING LOSS (ICD-389.9) - eval by DrBates for ENT...  ALLERGIC RHINITIS (ICD-477.9) - eval by DrESL on allergy shots 2x/wk... & FLONASE as  needed.  HYPERTENSION (ICD-401.9) - controlled on ATENOLOL 50mg /d,  DIOVAN 320mg /d, & HCTZ 25mg /d... BP= 122/84 today, takes meds regularly, tolerates well... states BP checks at home are similar to this... denies HA, fatigue, visual changes, CP, palipit, dizziness, syncope, dyspnea, edema, etc...  ~  note: coronary art calcif seen on prev CT Chest in 2006...  ~  CXR 9/10 showed chr asymmetric pl thickening right apex, calcif Ao, NAD...  CHEST PAIN (ICD-786.50) - Apypical CP eval 9/10...  ~  baseline EKG shows NSR, WNL.Marland Kitchen.  ~  Cards eval DrMcLean 9/10 was neg... BP sl elevated & Diovan increased.  ~  Myoview 9/10 showed no ischemia, no scar, EF= 58%...  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 he has known atherosclerotic changes seen on prev CT Neck 7/07 in the carotids w/o focal stenosis... note: coronary art calcif seen on prev CT Chest in 2006...  HYPERCHOLESTEROLEMIA (ICD-272.0) - prev on Vytorin 10/20 & switched to SIMVASTATIN 40mg /d in 2009 for $$ reasons... he knows to follow a low chol/ low fat diet & work on weight reduction...  ~  FLP 8/08 on Vytor10-20 showed TChol 124, TG 155, HDL 41, LDL 52  ~  FLP 9/09 on Vytor10-20 showed TChol 132, TG 154, HDL 41, LDL 60  ~  FLP 2/10 on Simva40 showed TChol 146, TG 173, HDL 36, LDL 75  ~  FLP 9/10 on Simva40 showed TChol 137, TG 160,  HDL 41, LDL 64  ~  FLP 5/11 on Simva40 showed TChol 155, TG 183, HDL 45, LDL 74  COLONIC POLYPS (ICD-211.3)  ~  colonoscopy 10/04 by Dorris Singh showed several 1-60mm polyps = hyperplastic & one frag adenomatous...   ~  f/u colon 12/09 was neg- no polyps seen & f/u rec 71yrs...  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 s/p shots, no surgery... currently taking DICLOFENAC 75mg Bid,  VICODIN Tid Prn, & CYCLOBENZAPRINE 10mg Bid Prn...  ~  Fall10:  he has seen DrHirsh  every few months & received several epid shots w/ temp relief...  GOUT (ICD-274.9) - on ALLOPURINOL 300mg /d...   ~  labs 9/09 showed UricAcid level= 4.7  ANXIETY (ICD-300.00)  BASAL CELL CARCINOMA, HX OF (ICD-V10.83) - s/p removal of 2 basal cell Ca's from neck and left arm by DrTaffeen...  HEALTH MAINTENANCE:    ~  colon- done by drKaplan 12/09 & neg- f/u 47yrs.  ~  GU- PSA 9/10 was normal at 1.35  ~  Immunization- had PNEUMOVAX 10/10 age 39... had 2010 Flu shot as well... OK TDAP today 5/11...   Allergies (verified): No Known Drug Allergies  Family History: Reviewed history from 06/21/2009 and no changes required. Father died age 52 w/ lung cancer Mother died age 63 w/ CHF 3 Siblings:   2 Brothers- one had CABG age 16 1 Sister- hx breast cancer  Social History: Reviewed history from 06/21/2009 and no changes required. Married 3 children Ex-smoker: quit 2000 after 35 yrs up to 2ppd Quit alcohol 1994 +caffeine 2-3 cups/day Retired  Review of Systems      See HPI       The patient complains of difficulty walking.  The patient denies anorexia, fever, weight loss, weight gain, vision loss, decreased hearing, hoarseness, chest pain, syncope, dyspnea on exertion, peripheral edema, prolonged cough, headaches, hemoptysis, abdominal pain, melena, hematochezia, severe indigestion/heartburn, hematuria, incontinence, muscle weakness, suspicious skin lesions, transient blindness, depression, unusual weight change, abnormal bleeding, enlarged lymph nodes, and angioedema.    Vital Signs:  Patient profile:   70 year old male Height:      74 inches Weight:      223 pounds BMI:     28.73 O2 Sat:      95 % on Room air Temp:     96.3 degrees F oral Pulse rate:   62 / minute BP sitting:   122 / 84  (left arm) Cuff size:   regular  Vitals Entered By: Randell Loop CMA (Feb 20, 2010 8:46 AM)  O2 Sat at Rest %:  95 O2 Flow:  Room air CC: 4 month ROV & review of mult medical  problems... Is Patient Diabetic? No Pain Assessment Patient in pain? yes      Comments meds updated today   Physical Exam  Additional Exam:  WD, WN, 70 y/o WM in NAD... GENERAL:  Alert & oriented; pleasant & cooperative... HEENT:  [place]/AT, EOM-wnl, PERRLA, EACs-clear, TMs-wnl, NOSE-clear, THROAT-clear & wnl. NECK:  Supple w/ fairROM; no JVD; normal carotid impulses w/o bruits; no thyromegaly or nodules palpated; no lymphadenopathy. CHEST:  Clear to P & A; without wheezes/ rales/ or rhonchi heard... min tender chest wall... HEART:  Regular Rhythm; faint gr1/6 SEM apex, no rubs or gallops detected... ABDOMEN:  Soft & nontender; normal bowel sounds; no organomegaly or masses palpated. EXT: without deformities, mod  arthritic changes; no varicose veins/+ venous insuffic/ no edema. NEURO:  CN's intact; no focal neuro deficits; SLR restricted w/ pain... DERM:  No lesions noted; no rash etc...    MISC. Report  Procedure date:  02/20/2010  Findings:      BMP (METABOL)   Sodium                    143 mEq/L                   135-145   Potassium                 4.2 mEq/L                   3.5-5.1   Chloride                  104 mEq/L                   96-112   Carbon Dioxide            32 mEq/L                    19-32   Glucose                   84 mg/dL                    46-96   BUN                       13 mg/dL                    2-95   Creatinine                1.3 mg/dL                   2.8-4.1   Calcium                   9.4 mg/dL                   3.2-44.0   GFR                       61.21 mL/min                >60  Hepatic/Liver Function Panel (HEPATIC)   Total Bilirubin           0.8 mg/dL                   1.0-2.7   Direct Bilirubin          0.1 mg/dL                   2.5-3.6   Alkaline Phosphatase      42 U/L                      39-117   AST                       27 U/L                      0-37   ALT  29 U/L                      0-53   Total  Protein             6.6 g/dL                    7.8-2.9   Albumin                   4.4 g/dL                    5.6-2.1  Lipid Panel (LIPID)   Cholesterol               155 mg/dL                   3-086   Triglycerides        [H]  183.0 mg/dL                 5.7-846.9   HDL                       62.95 mg/dL                 >28.41   LDL Cholesterol           74 mg/dL                    3-24   Impression & Recommendations:  Problem # 1:  HYPERTENSION (ICD-401.9) Controlled>  same meds. His updated medication list for this problem includes:    Atenolol 50 Mg Tabs (Atenolol) .Marland Kitchen... Take 1 tablet by mouth once a day    Diovan 320 Mg Tabs (Valsartan) .Marland Kitchen... Take 1 tab by mouth once daily...    Hydrochlorothiazide 25 Mg Tabs (Hydrochlorothiazide) .Marland Kitchen... Take 1 tab by mouth once daily in am...  Orders: Prescription Created Electronically [phone]) TLB-BMP (Basic Metabolic Panel-BMET) (80048-METABOL) TLB-Hepatic/Liver Function Pnl (80076-HEPATIC) TLB-Lipid Panel (80061-LIPID)  Problem # 2:  CHEST PAIN (ICD-786.50) Hx atyp CP>  neg cards eval by DrMcLean...  Problem # 3:  HYPERCHOLESTEROLEMIA (ICD-272.0) FLP looks OK on the Simva40... reminded to stay on low fat diet + get weight down, try adding Fish Oil... His updated medication list for this problem includes:    Simvastatin 40 Mg Tabs (Simvastatin) .Marland Kitchen... Take 1 tab by mouth at bedtime...  Problem # 4:  COLONIC POLYPS (ICD-211.3) GI is stable and up to date...  Problem # 5:  BACK PAIN, LUMBAR (ICD-724.2) This is his CC & followed by DrHirsh for Neurosurg... The following medications were removed from the medication list:    Tramadol Hcl 50 Mg Tabs (Tramadol hcl) .Marland Kitchen... Take 1 tab by mouth 4 times daily as needed for pain... His updated medication list for this problem includes:    Aspirin Adult Low Strength 81 Mg Tbec (Aspirin) .Marland Kitchen... Take 1 tablet by mouth once a day    Vicodin 5-500 Mg Tabs (Hydrocodone-acetaminophen) .Marland Kitchen... Take 1 tab by  mouth every 6 h as needed for pain...    Diclofenac Sodium 75 Mg Tbec (Diclofenac sodium) .Marland Kitchen... 2 by mouth once daily    Cyclobenzaprine Hcl 10 Mg Tabs (Cyclobenzaprine hcl) .Marland Kitchen... 2 tabs by mouth once daily  Problem # 6:  ANXIETY (ICD-300.00) Aware>  he does not require anxiolytics...  Complete Medication List: 1)  Flonase 50 Mcg/act Susp (Fluticasone propionate) .... Use 2 sprays  in each nostril once daily 2)  Loratadine 10 Mg Tabs (Loratadine) .Marland Kitchen.. 1-2 tabs by mouth once daily 3)  Aspirin Adult Low Strength 81 Mg Tbec (Aspirin) .... Take 1 tablet by mouth once a day 4)  Atenolol 50 Mg Tabs (Atenolol) .... Take 1 tablet by mouth once a day 5)  Diovan 320 Mg Tabs (Valsartan) .... Take 1 tab by mouth once daily.Marland KitchenMarland Kitchen 6)  Hydrochlorothiazide 25 Mg Tabs (Hydrochlorothiazide) .... Take 1 tab by mouth once daily in am... 7)  Simvastatin 40 Mg Tabs (Simvastatin) .... Take 1 tab by mouth at bedtime.Marland KitchenMarland Kitchen 8)  Omeprazole 20 Mg Cpdr (Omeprazole) .... Take 1 tab by mouth two times a day- 30 min before breakfast & dinner... 9)  Allopurinol 300 Mg Tabs (Allopurinol) .... Take 1 tablet by mouth once a day 10)  Vicodin 5-500 Mg Tabs (Hydrocodone-acetaminophen) .... Take 1 tab by mouth every 6 h as needed for pain... 11)  Diclofenac Sodium 75 Mg Tbec (Diclofenac sodium) .... 2 by mouth once daily 12)  Cyclobenzaprine Hcl 10 Mg Tabs (Cyclobenzaprine hcl) .... 2 tabs by mouth once daily 13)  Lotrisone 1-0.05 % Crea (Clotrimazole-betamethasone) .... Apply as directed  Other Orders: Tdap => 38yrs IM (16109) Admin 1st Vaccine (60454)  Patient Instructions: 1)  Today we updated your med list- see below.... 2)  We refilled the HCTZ for #90 as you requested... 3)  Today we did your follow up fasting blood work... please call the "phone tree" in a few days for your lab results.Marland KitchenMarland Kitchen 4)  We also gave you the 58yr tetanus vaccine called the TDAP.Marland KitchenMarland Kitchen 5)  Call for any problems.Marland KitchenMarland Kitchen 6)  Please schedule a follow-up appointment  in 6 months, sooner as needed. Prescriptions: HYDROCHLOROTHIAZIDE 25 MG TABS (HYDROCHLOROTHIAZIDE) take 1 tab by mouth once daily in AM...  #90 x 4   Entered and Authorized by:   Michele Mcalpine MD   Signed by:   Michele Mcalpine MD on 02/20/2010   Method used:   Print then Give to Patient   RxID:   [id number]    Immunizations Administered:  Tetanus Vaccine:    Vaccine Type: Tdap    Site: left deltoid    Mfr: boostrix    Dose: 0.5 ml    Route: IM    Given by: Randell Loop CMA    Exp. Date: 12/21/2011    Lot #: [id number]    VIS given: 08/16/07 version given Feb 20, 2010.

## 2010-10-30 NOTE — Progress Notes (Signed)
Summary: refills  Phone Note Call from Patient Call back at Home Phone [phone]   Caller: Patient Call For: Brandon Haynes Summary of Call: Needs refills on allopurinol 300mg , simvastatin 40 mg, and atenolol 50 mg faxed to [facility]//[phone]. Initial call taken by: Darletta Moll,  November 05, 2009 9:09 AM  Follow-up for Phone Call        rx sent. pt aware. Carron Curie CMA  November 05, 2009 9:38 AM     Prescriptions: ALLOPURINOL 300 MG TABS (ALLOPURINOL) Take 1 tablet by mouth once a day  #90 x 3   Entered by:   Carron Curie CMA   Authorized by:   Michele Mcalpine MD   Signed by:   Carron Curie CMA on 11/05/2009   Method used:   Faxed to ...       [facility]* (mail-order)       [address]       [place], [place]  [address]       Ph: [phone]       Fax: [phone]   RxID:   [id number] SIMVASTATIN 40 MG TABS (SIMVASTATIN) take 1 tab by mouth at bedtime...  #90 x 3   Entered by:   Carron Curie CMA   Authorized by:   Michele Mcalpine MD   Signed by:   Carron Curie CMA on 11/05/2009   Method used:   Faxed to ...       [facility]* (mail-order)       [address]       [place], [place]  [address]       Ph: [phone]       Fax: [phone]   RxID:   [id number] ATENOLOL 50 MG  TABS (ATENOLOL) Take 1 tablet by mouth once a day  #90 x 3   Entered by:   Carron Curie CMA   Authorized by:   Michele Mcalpine MD   Signed by:   Carron Curie CMA on 11/05/2009   Method used:   Faxed to ...       [facility]* (mail-order)       [address]       [place], [place]  [address]       Ph: [phone]       Fax: [phone]   RxID:   [id number]

## 2010-10-30 NOTE — Letter (Signed)
Summary: [facility]  [facility]   Imported By: Lester Swannanoa 09/18/2010 11:01:39  _____________________________________________________________________  External Attachment:    Type:   Image     Comment:   External Document

## 2010-10-31 NOTE — Letter (Signed)
Summary: [facility]  [facility]   Imported By: Lester Sturgis 09/01/2010 09:13:48  _____________________________________________________________________  External Attachment:    Type:   Image     Comment:   External Document

## 2010-12-19 ENCOUNTER — Telehealth: Payer: Self-pay | Admitting: Pulmonary Disease

## 2010-12-19 ENCOUNTER — Encounter: Payer: Self-pay | Admitting: Pulmonary Disease

## 2010-12-19 MED ORDER — ALLOPURINOL 300 MG PO TABS: 300.0000 mg | ORAL_TABLET | Freq: Every day | ORAL | Status: DC

## 2010-12-19 NOTE — Telephone Encounter (Signed)
Rx for allopurinol was sent to [facility].

## 2010-12-23 ENCOUNTER — Encounter: Payer: Self-pay | Admitting: *Deleted

## 2010-12-26 ENCOUNTER — Telehealth: Payer: Self-pay | Admitting: Pulmonary Disease

## 2010-12-26 MED ORDER — ALLOPURINOL 300 MG PO TABS: 300.0000 mg | ORAL_TABLET | Freq: Every day | ORAL | Status: DC

## 2010-12-26 NOTE — Telephone Encounter (Signed)
Spoke with pt and he states needing 90 days supply sent to [facility].  Rx was sent.

## 2011-02-16 ENCOUNTER — Ambulatory Visit (INDEPENDENT_AMBULATORY_CARE_PROVIDER_SITE_OTHER)
Admission: RE | Admit: 2011-02-16 | Discharge: 2011-02-16 | Disposition: A | Discharge status: Home / Self Care | Payer: Medicare Other | Source: Ambulatory Visit | Attending: Pulmonary Disease | Admitting: Pulmonary Disease

## 2011-02-16 ENCOUNTER — Ambulatory Visit (INDEPENDENT_AMBULATORY_CARE_PROVIDER_SITE_OTHER): Payer: Medicare Other | Admitting: Pulmonary Disease

## 2011-02-16 ENCOUNTER — Other Ambulatory Visit (INDEPENDENT_AMBULATORY_CARE_PROVIDER_SITE_OTHER): Payer: Medicare Other

## 2011-02-16 ENCOUNTER — Encounter: Payer: Self-pay | Admitting: Pulmonary Disease

## 2011-02-16 DIAGNOSIS — J309 Allergic rhinitis, unspecified: Secondary | ICD-10-CM

## 2011-02-16 DIAGNOSIS — M545 Low back pain, unspecified: Secondary | ICD-10-CM

## 2011-02-16 DIAGNOSIS — F411 Generalized anxiety disorder: Secondary | ICD-10-CM

## 2011-02-16 DIAGNOSIS — I10 Essential (primary) hypertension: Secondary | ICD-10-CM

## 2011-02-16 DIAGNOSIS — M109 Gout, unspecified: Secondary | ICD-10-CM

## 2011-02-16 DIAGNOSIS — E7800 Pure hypercholesterolemia, unspecified: Secondary | ICD-10-CM

## 2011-02-16 DIAGNOSIS — I739 Peripheral vascular disease, unspecified: Secondary | ICD-10-CM

## 2011-02-16 DIAGNOSIS — IMO0002 Reserved for concepts with insufficient information to code with codable children: Secondary | ICD-10-CM

## 2011-02-16 LAB — HEPATIC FUNCTION PANEL
ALT: 20 U/L (ref 0–53)
AST: 20 U/L (ref 0–37)
Alkaline Phosphatase: 39 U/L (ref 39–117)
Bilirubin, Direct: 0.2 mg/dL (ref 0.0–0.3)
Total Bilirubin: 1.1 mg/dL (ref 0.3–1.2)

## 2011-02-16 LAB — CBC WITH DIFFERENTIAL/PLATELET
Eosinophils Absolute: 0 10*3/uL (ref 0.0–0.7)
MCHC: 34.4 g/dL (ref 30.0–36.0)
MCV: 94.6 fl (ref 78.0–100.0)
Monocytes Absolute: 0.7 10*3/uL (ref 0.1–1.0)
Neutrophils Relative %: 70.9 % (ref 43.0–77.0)
Platelets: 189 10*3/uL (ref 150.0–400.0)

## 2011-02-16 LAB — LIPID PANEL
Cholesterol: 142 mg/dL (ref 0–200)
LDL Cholesterol: 58 mg/dL (ref 0–99)
Total CHOL/HDL Ratio: 3

## 2011-02-16 LAB — BASIC METABOLIC PANEL
Chloride: 101 mEq/L (ref 96–112)
Potassium: 4 mEq/L (ref 3.5–5.1)

## 2011-02-16 LAB — TSH: TSH: 0.81 u[IU]/mL (ref 0.35–5.50)

## 2011-02-16 LAB — URIC ACID: Uric Acid, Serum: 5.1 mg/dL (ref 4.0–7.8)

## 2011-02-16 NOTE — Patient Instructions (Signed)
Today we updated your med list in EPIC...    Continue your current meds the same...  Today we did your follow up CXR & fasting blood work...    Please call the PHONE TREE in a few days for your results...    Dial [id number] & when prompted enter your patient number followed by the # symbol...    Your patient number is:  [id number]#  We wrote a prescription for a Shingles vaccine that you may fill at [facility] etc...  Call for any questions...  Let's plan a follow up visit in 57mo, sooner if needed for problems.Marland KitchenMarland Kitchen

## 2011-02-16 NOTE — Progress Notes (Signed)
Subjective:    Patient ID: Brandon Haynes, male    DOB: [birth year], 70 y.o.   MRN: [id number]  HPI 70 y/o WM here for a follow up visit... he has multiple medical problems as noted below...   ~  October 23, 2009:  he had incr LBP- saw DrHirsh w/ epid steroid shot & improved... also has Diclofenac, Vicodin, Flexeril...  saw DrMcLean 10/10 for f/u CP, HBP, Chol- added HCTZ25mg /d...  ~  Feb 20, 2010:  improved overall- but still having back problems eval by DrHirsh w/ rec for incr exercise & try to avoid surg since it would be extensive... BP is controlled on meds;  no further chest discomfort noted;  Chol looks good on Simva40;  needs TDAP today...  ~  August 19, 2010:  60mo ROV & stable- persist back discomfort followed by DrHirsh for Neurosurg;  BP controlled on meds;  denies CP, palpit, ch in SOB, etc;  Chol Ok on Simva40 + diet;  GI stable on meds;  he requests med refills today...  ~  Feb 16, 2011:  60mo ROV & he continues to receive epid steroid shots per DrHirsh for relief of back & leg pains> still on Tramadol, Vicodin, Cyclobenz... Requesting Rx for Shingles vaccine... Medically stable> see prob list below; fasting labs today> all looks good...          Problem List:  HEARING LOSS (ICD-389.9) - eval by DrBates for ENT...  ALLERGIC RHINITIS (ICD-477.9) - eval by DrESL on allergy shots 2x/wk, CLARITIN, & FLONASE as needed.  HYPERTENSION (ICD-401.9) - controlled on ATENOLOL 50mg /d,  DIOVAN 320mg /d, & HCTZ 25mg /d... BP= 140/90 today & reminded to take meds regularly, tolerates well... states BP checks at home are better than this... denies HA, fatigue, visual changes, CP, palipit, dizziness, syncope, dyspnea, edema, etc... ~  note: coronary art calcif seen on prev CT Chest in 2006... ~  CXR 9/10 showed chr asymmetric pl thickening right apex, calcif Ao, NAD.Marland Kitchen. ~  CXR 5/12 showed upper normal heart size 7 atherosclerotic Ao, clear lungs w/ granuloma left base, NAD...  CHEST PAIN  (ICD-786.50) - Apypical CP eval 9/10... ~  baseline EKG shows NSR, WNL.Marland Kitchen. ~  Cards eval DrMcLean 9/10 was neg... BP sl elevated & Diovan increased. ~  Myoview 9/10 showed no ischemia, no scar, EF= 58%...  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 he has known atherosclerotic changes seen on prev CT Neck 7/07 in the carotids w/o focal stenosis... note: coronary art calcif seen on prev CT Chest in 2006...  HYPERCHOLESTEROLEMIA (ICD-272.0) - prev on Vytorin 10/20 & switched to SIMVASTATIN 40mg /d in 2009 for $$ reasons... he knows to follow a low chol/ low fat diet & work on weight reduction... ~  FLP 8/08 on Vytor10-20 showed TChol 124, TG 155, HDL 41, LDL 52 ~  FLP 9/09 on Vytor10-20 showed TChol 132, TG 154, HDL 41, LDL 60 ~  FLP 2/10 on Simva40 showed TChol 146, TG 173, HDL 36, LDL 75 ~  FLP 9/10 on Simva40 showed TChol 137, TG 160, HDL 41, LDL 64 ~  FLP 5/11 on Simva40 showed TChol 155, TG 183, HDL 45, LDL 74 ~  FLP 5/12 on Simva40 showed TChol 142, TG 178, HDL 48, LDL 58... Needs better low fat diet!  GERD (ICD-530.81) - on OMEPRAZOLE 20mg  Bid...  COLONIC POLYPS (ICD-211.3) ~  colonoscopy 10/04 by Dorris Singh showed several 1-41mm polyps = hyperplastic & one frag adenomatous...  ~  f/u colon 12/09 was neg-  no polyps seen & f/u rec 64yrs...  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 s/p shots, no surgery... currently taking DICLOFENAC 75mg Bid,  VICODIN Tid Prn, & CYCLOBENZAPRINE 10mg Bid Prn... ~  Fall10:  he has seen DrHirsh every few months & received several epid shots w/ temp relief... ~  11/11:  he's had 2 more epid steroid shots from DrHirsh & reports min relief... he continues to get his pain meds from [place]... he requests TRAMADOL instead of Diclofenac... we discussed poss pain clinic referral.  GOUT (ICD-274.9) - on ALLOPURINOL  300mg /d...  ~  labs 9/09 showed UricAcid level= 4.7 ~  Labs 5/12 showed Uric= 5.1  ANXIETY (ICD-300.00)  BASAL CELL CARCINOMA, HX OF (ICD-V10.83) - s/p removal of 2 basal cell Ca's from neck and left arm by DrTaffeen...  HEALTH MAINTENANCE:   ~  colon- done by [facility] 12/09 & neg- f/u 33yrs. ~  GU- PSA 11/11 was 2.13... ~  Immunization- had PNEUMOVAX 10/10 age 58... had 2010 Flu shot as well... OK TDAP today 5/11... Rx written for shingles vacc.   Past Surgical History  Procedure Date  . Back injection oct 2011, nov 2011    Outpatient Encounter Prescriptions as of 02/16/2011  Medication Sig Dispense Refill  . allopurinol (ZYLOPRIM) 300 MG tablet Take 1 tablet (300 mg total) by mouth daily.  90 tablet  3  . aspirin 81 MG tablet Take 81 mg by mouth daily.        Marland Kitchen atenolol (TENORMIN) 50 MG tablet Take 50 mg by mouth daily.        . fluticasone (FLONASE) 50 MCG/ACT nasal spray 2 sprays by Nasal route daily.        . hydrochlorothiazide 25 MG tablet Take 25 mg by mouth daily.        Marland Kitchen HYDROcodone-acetaminophen (VICODIN) 5-500 MG per tablet Take 1 tablet by mouth every 6 (six) hours as needed.        . loratadine (CLARITIN) 10 MG tablet Take 10 mg by mouth daily. As needed       . meloxicam (MOBIC) 7.5 MG tablet Take 7.5 mg by mouth 2 (two) times daily.        . methocarbamol (ROBAXIN) 500 MG tablet Take 500 mg by mouth 2 (two) times daily.        Marland Kitchen omeprazole (PRILOSEC) 20 MG capsule Take 20 mg by mouth 2 (two) times daily before a meal.        . simvastatin (ZOCOR) 40 MG tablet Take 40 mg by mouth at bedtime.        . traMADol (ULTRAM) 50 MG tablet Take 50 mg by mouth every 6 (six) hours as needed.        . valsartan (DIOVAN) 320 MG tablet Take 320 mg by mouth daily.          No Known Allergies   Review of Systems         See HPI - all other systems neg except as noted... The patient complains of decreased hearing, dyspnea on exertion, and difficulty walking.  The patient denies  anorexia, fever, weight loss, weight gain, vision loss, hoarseness, chest pain, syncope, peripheral edema, prolonged cough, headaches, hemoptysis, abdominal pain, melena, hematochezia, severe indigestion/heartburn, hematuria, incontinence, muscle weakness, suspicious skin lesions, transient blindness, depression, unusual weight change, abnormal bleeding, enlarged lymph nodes, and angioedema.  Objective:   Physical Exam     WD, WN, 70 y/o WM in NAD... GENERAL:  Alert & oriented; pleasant & cooperative... HEENT:  [place]/AT, EOM-wnl, PERRLA, EACs-clear, TMs-wnl, NOSE-clear, THROAT-clear & wnl. NECK:  Supple w/ fairROM; no JVD; normal carotid impulses w/o bruits; no thyromegaly or nodules palpated; no lymphadenopathy. CHEST:  Clear to P & A; without wheezes/ rales/ or rhonchi heard... min tender chest wall... HEART:  Regular Rhythm; faint gr1/6 SEM apex, no rubs or gallops detected... ABDOMEN:  Soft & nontender; normal bowel sounds; no organomegaly or masses palpated. EXT: without deformities, mod arthritic changes; no varicose veins/+ venous insuffic/ no edema. NEURO:  CN's intact; no focal neuro deficits; SLR restricted w/ pain... DERM:  No lesions noted; no rash etc...   Assessment & Plan:   HBP>  Controlled on meds> BBlocker, ARB, Diuretic> continue same + better diet, get wt down.  ASPVD>  Need to address risk factors> contine ASA & BP meds/ Statin, get on diet, get wt down...  GI>  Followed by DrKaplan, stable, continue current meds.  DJD, LBP, DDD>  Followed by Carles Collet w/ freq epid sterioid injections; on Vicodin, Mobic, Tramadol, Methocarbamol, GI protection...  Gout>  Uric looks good on allopurinol Rx...  Anxiety>  Aware, not currently on anxiolytic agent.Marland KitchenMarland Kitchen

## 2011-02-23 ENCOUNTER — Encounter: Payer: Self-pay | Admitting: Pulmonary Disease

## 2011-03-16 ENCOUNTER — Other Ambulatory Visit: Payer: Self-pay | Admitting: Pulmonary Disease

## 2011-04-27 ENCOUNTER — Telehealth: Payer: Self-pay | Admitting: Pulmonary Disease

## 2011-04-27 MED ORDER — HYDROCHLOROTHIAZIDE 25 MG PO TABS: 25.0000 mg | ORAL_TABLET | Freq: Every day | ORAL | Status: DC

## 2011-04-27 NOTE — Telephone Encounter (Signed)
Pt last seen by SN 5.21.12.  Called spoke with patient, verified medication requested, dose and pharmacy.  rx sent electronically.

## 2011-05-21 ENCOUNTER — Telehealth: Payer: Self-pay | Admitting: Pulmonary Disease

## 2011-05-21 NOTE — Telephone Encounter (Signed)
[email or web address]  

## 2011-05-22 MED ORDER — ROSUVASTATIN CALCIUM 20 MG PO TABS: 20.0000 mg | ORAL_TABLET | Freq: Every day | ORAL | Status: DC

## 2011-05-22 NOTE — Telephone Encounter (Signed)
Per SN---ok to change to crestor 20mg    #90  Once daily. thanks

## 2011-05-22 NOTE — Telephone Encounter (Signed)
Pt aware SN ok to change simvastatin to crestor 20 mg qd and rx sent to [facility].  He verbalized understanding of this.

## 2011-05-22 NOTE — Telephone Encounter (Signed)
Called, spoke with pt.  He is currently taking simvastatin 40 mg qhs.  He has read of the side effect of muscle damage while on this medication.  Denies any problems "that I know of" with his muscles at this time but is "scared" to continue this medication with this side effect.  He would like to switch to lipitor or crestor if ok with SN.  Requesting 90 day supply - [facility].  Dr. Kriste Basque, pls advise if ok for pt to switch from simvastatin to something else.  Thank you!

## 2011-06-23 ENCOUNTER — Other Ambulatory Visit: Payer: Self-pay | Admitting: Neurosurgery

## 2011-06-23 DIAGNOSIS — IMO0002 Reserved for concepts with insufficient information to code with codable children: Secondary | ICD-10-CM

## 2011-07-01 ENCOUNTER — Ambulatory Visit
Admission: RE | Admit: 2011-07-01 | Discharge: 2011-07-01 | Disposition: A | Discharge status: Home / Self Care | Payer: Medicare Other | Source: Ambulatory Visit | Attending: Neurosurgery | Admitting: Neurosurgery

## 2011-07-01 DIAGNOSIS — IMO0002 Reserved for concepts with insufficient information to code with codable children: Secondary | ICD-10-CM

## 2011-07-27 ENCOUNTER — Other Ambulatory Visit: Payer: Self-pay | Admitting: Dermatology

## 2011-08-24 ENCOUNTER — Other Ambulatory Visit: Payer: Self-pay | Admitting: Dermatology

## 2011-08-28 ENCOUNTER — Ambulatory Visit (INDEPENDENT_AMBULATORY_CARE_PROVIDER_SITE_OTHER): Payer: Medicare Other | Admitting: Pulmonary Disease

## 2011-08-28 ENCOUNTER — Encounter: Payer: Self-pay | Admitting: Pulmonary Disease

## 2011-08-28 ENCOUNTER — Other Ambulatory Visit (INDEPENDENT_AMBULATORY_CARE_PROVIDER_SITE_OTHER): Payer: Medicare Other

## 2011-08-28 DIAGNOSIS — E7800 Pure hypercholesterolemia, unspecified: Secondary | ICD-10-CM

## 2011-08-28 DIAGNOSIS — IMO0002 Reserved for concepts with insufficient information to code with codable children: Secondary | ICD-10-CM

## 2011-08-28 DIAGNOSIS — I10 Essential (primary) hypertension: Secondary | ICD-10-CM

## 2011-08-28 DIAGNOSIS — K219 Gastro-esophageal reflux disease without esophagitis: Secondary | ICD-10-CM

## 2011-08-28 DIAGNOSIS — D126 Benign neoplasm of colon, unspecified: Secondary | ICD-10-CM

## 2011-08-28 DIAGNOSIS — Z85828 Personal history of other malignant neoplasm of skin: Secondary | ICD-10-CM

## 2011-08-28 DIAGNOSIS — M545 Low back pain, unspecified: Secondary | ICD-10-CM

## 2011-08-28 DIAGNOSIS — N139 Obstructive and reflux uropathy, unspecified: Secondary | ICD-10-CM

## 2011-08-28 DIAGNOSIS — F411 Generalized anxiety disorder: Secondary | ICD-10-CM

## 2011-08-28 DIAGNOSIS — M109 Gout, unspecified: Secondary | ICD-10-CM

## 2011-08-28 LAB — LIPID PANEL
LDL Cholesterol: 77 mg/dL (ref 0–99)
Total CHOL/HDL Ratio: 3
VLDL: 31.2 mg/dL (ref 0.0–40.0)

## 2011-08-28 MED ORDER — ROSUVASTATIN CALCIUM 20 MG PO TABS: 20.0000 mg | ORAL_TABLET | Freq: Every day | ORAL | Status: DC

## 2011-08-28 MED ORDER — ATENOLOL 50 MG PO TABS: 50.0000 mg | ORAL_TABLET | Freq: Every day | ORAL | Status: DC

## 2011-08-28 MED ORDER — VALSARTAN 320 MG PO TABS: 320.0000 mg | ORAL_TABLET | Freq: Every day | ORAL | Status: DC

## 2011-08-28 NOTE — Patient Instructions (Signed)
Today we updated your med list in our EPIC system...    Continue your current medications the same...  Today we did your follow up fasting blood work...    Please call the [facility]ONE [facility]REE in a few days for your results...    Dial [id number] & when prompted enter your patient number followed by the # symbol...    Your patient number is:  [id number]#  Keep up the physical therapy...  Call for any questions...  Let's plan another follow up visit in 6 months.Marland KitchenMarland Kitchen

## 2011-08-28 NOTE — Progress Notes (Signed)
Subjective:    Patient ID: Brandon Haynes, male    DOB: 11-26-1940, 70 y.o.   MRN: [id number]  HPI 70 y/o WM here for a follow up visit... he has multiple medical problems as noted below...   ~  2011:  incr LBP- saw DrHirsh w/ epid steroid shot & improved; rec for incr exercise & try to avoid surg since it would be extensive; also has Diclofenac, Vicodin, Flexeril...  saw DrMcLean 10/10 for f/u CP, HBP, Chol- added HCTZ25mg /d; BP is controlled on meds;  no further chest discomfort noted;  Chol looks good on Simva40;   ~  Feb 16, 2011:  11mo ROV & he continues to receive epid steroid shots per DrHirsh for relief of back & leg pains> now on Tramadol, Vicodin, Cyclobenz... Requesting Rx for Shingles vaccine... Medically stable> see prob list below; fasting labs today> all looks good...  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  BP controlled on Aten, Diovan, HCT & he denies CP, palpit, dizzy, SOB, edema; no cerebral ischemic symptoms on ASA daily; he called worried about what he read regarding Simva40 & we switched him to Crestor20- tol well & FLP today shows           Problem List:  HEARING LOSS (ICD-389.9) - eval by DrBates for ENT...  ALLERGIC RHINITIS (ICD-477.9) - eval by DrESL on allergy shots 2x/wk, CLARITIN, & FLONASE as needed. ~  Allergy re-eval 8/12 by DrESL> on Claritin & Flonase but still uses decongestant spray Qhs & asked to stop this practice...  HYPERTENSION (ICD-401.9) - controlled on ATENOLOL 50mg /d,  DIOVAN 320mg /d, & HCTZ 25mg /d... ~  note: coronary art calcif seen on prev CT Chest in 2006... ~  CXR 9/10 showed chr asymmetric pl thickening right apex, calcif Ao, NAD.Marland Kitchen. ~  CXR 5/12 showed upper normal heart size 7 atherosclerotic Ao, clear lungs w/ granuloma left base,  NAD... ~  11/12: BP= 142/82 today & reminded to take meds regularly> states BP checks at home are better than this; denies HA, fatigue, visual changes, CP, palipit, dizziness, syncope, dyspnea, edema, etc...  CHEST PAIN (ICD-786.50) - Apypical CP eval 9/10... ~  baseline EKG shows NSR, WNL.Marland Kitchen. ~  Cards eval DrMcLean 9/10 was neg... BP sl elevated & Diovan increased. ~  Myoview 9/10 showed no ischemia, no scar, EF= 58%...  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 he has known atherosclerotic changes seen on prev CT Neck 7/07 in the carotids w/o focal stenosis... note: coronary art calcif seen on prev CT Chest in 2006...  HYPERCHOLESTEROLEMIA (ICD-272.0) - prev on Vytorin 10/20; switched to Simva40 in 2009 for $$ reasons; then changed to CRESTOR 20mg /d ~9/12 at his request due to something that he read about the Simvastatin; he knows to follow a low chol/ low fat diet & work on weight reduction... ~  FLP 8/08 on Vytor10-20 showed TChol 124, TG 155, HDL 41, LDL 52 ~  FLP 9/09 on Vytor10-20 showed TChol 132, TG 154, HDL 41, LDL 60 ~  FLP 2/10 on Simva40 showed TChol 146, TG 173, HDL 36, LDL 75 ~  FLP 9/10 on Simva40 showed TChol 137, TG 160, HDL 41, LDL 64 ~  FLP 5/11 on Simva40 showed TChol 155, TG 183, HDL 45, LDL 74 ~  FLP 5/12 on Simva40 showed TChol 142, TG 178, HDL 48, LDL 58... Needs better low fat diet! ~  FLP 11/12 on Cres20 showed TChol 158, TG 156, HDL 50, LDL 77  GERD (ICD-530.81) - on OMEPRAZOLE 20mg  Bid...  COLONIC POLYPS (ICD-211.3) ~  colonoscopy 10/04 by Dorris Singh showed several 1-50mm polyps = hyperplastic & one frag adenomatous...  ~  f/u colon 12/09 was neg- no polyps seen & f/u rec 38yrs...  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 s/p shots, no surgery... currently taking MOBIC 7.5mg  up to 2/d,  VICODIN Tid Prn, & ROBAXIN  4500mg Bid Prn... ~  Fall10:  he has seen DrHirsh every few months & received several epid shots w/ temp relief... ~  11/11:  he's had 2 more epid steroid shots from DrHirsh & reports min relief... he continues to get his pain meds from [place]... he requests TRAMADOL instead of Diclofenac... we discussed poss pain clinic referral. ~  11/12:  Now taking Mobic, Robaxin, Neurontin, and off the Vicodin & Tramadol...  GOUT (ICD-274.9) - on ALLOPURINOL 300mg /d...  ~  labs 9/09 showed UricAcid level= 4.7 ~  Labs 5/12 showed Uric= 5.1  ANXIETY (ICD-300.00)  BASAL CELL CARCINOMA, HX OF (ICD-V10.83) - s/p removal of 2 basal cell Ca's from neck and left arm by DrTaffeen...  HEALTH MAINTENANCE:   ~  colon- done by [facility] 12/09 & neg- f/u 56yrs. ~  GU- PSA 11/11 was 2.13... ~  Immunization- had PNEUMOVAX 10/10 age 60... had 2010 Flu shot as well... had TDAP 5/11... Rx written for shingles vacc.   Past Surgical History  Procedure Date  . Back injection oct 2011, nov 2011    Outpatient Encounter Prescriptions as of 08/28/2011  Medication Sig Dispense Refill  . allopurinol (ZYLOPRIM) 300 MG tablet Take 1 tablet (300 mg total) by mouth daily.  90 tablet  3  . aspirin 81 MG tablet Take 81 mg by mouth daily.        Marland Kitchen atenolol (TENORMIN) 50 MG tablet Take 50 mg by mouth daily.        . fluticasone (FLONASE) 50 MCG/ACT nasal spray 2 sprays by Nasal route daily.        Marland Kitchen gabapentin (NEURONTIN) 300 MG capsule Take 300 mg by mouth 3 (three) times daily.        . hydrochlorothiazide 25 MG tablet Take 1 tablet (25 mg total) by mouth daily.  90 tablet  3  . loratadine (CLARITIN) 10 MG tablet Take 10 mg by mouth daily. As needed       . meloxicam (MOBIC) 7.5 MG tablet Take 7.5 mg by mouth 2 (two) times daily.        . methocarbamol (ROBAXIN) 500 MG tablet Take 500 mg by mouth 2 (two) times daily.        Marland Kitchen omeprazole (PRILOSEC) 20 MG capsule Take 20 mg by mouth 2 (two) times daily before a meal.        .  rosuvastatin (CRESTOR) 20 MG tablet Take 1 tablet (20 mg total) by mouth daily.  90 tablet  0  . valsartan (DIOVAN) 320 MG tablet Take 320 mg by mouth daily.        Marland Kitchen DISCONTD: HYDROcodone-acetaminophen (VICODIN) 5-500 MG per tablet Take 1 tablet by mouth every 6 (six) hours as needed.        Marland Kitchen DISCONTD:  traMADol (ULTRAM) 50 MG tablet TAKE 1 TABLET BY MOUTH EVERY 6 HOURS AS NEEDED FOR PAIN  100 tablet  0    No Known Allergies   Current Medications, Allergies, Past Medical History, Past Surgical History, Family History, and Social History were reviewed in [employer] record.    Review of Systems         See HPI - all other systems neg except as noted... The patient complains of decreased hearing, dyspnea on exertion, and difficulty walking.  The patient denies anorexia, fever, weight loss, weight gain, vision loss, hoarseness, chest pain, syncope, peripheral edema, prolonged cough, headaches, hemoptysis, abdominal pain, melena, hematochezia, severe indigestion/heartburn, hematuria, incontinence, muscle weakness, suspicious skin lesions, transient blindness, depression, unusual weight change, abnormal bleeding, enlarged lymph nodes, and angioedema.     Objective:   Physical Exam     WD, WN, 70 y/o WM in NAD... GENERAL:  Alert & oriented; pleasant & cooperative... HEENT:  [place]/AT, EOM-wnl, PERRLA, EACs-clear, TMs-wnl, NOSE-clear, THROAT-clear & wnl. NECK:  Supple w/ fairROM; no JVD; normal carotid impulses w/o bruits; no thyromegaly or nodules palpated; no lymphadenopathy. CHEST:  Clear to P & A; without wheezes/ rales/ or rhonchi heard... min tender chest wall... HEART:  Regular Rhythm; faint gr1/6 SEM apex, no rubs or gallops detected... ABDOMEN:  Soft & nontender; normal bowel sounds; no organomegaly or masses palpated. EXT: without deformities, mod arthritic changes; no varicose veins/+ venous insuffic/ no edema. NEURO:  CN's intact; no focal neuro deficits; SLR  restricted w/ pain... DERM:  No lesions noted; no rash etc...   Assessment & Plan:   HBP>  Controlled on meds> BBlocker, ARB, Diuretic> continue same + better diet, get wt down.  ASPVD>  Need to address risk factors> contine ASA & BP meds/ Statin, get on diet, get wt down...  CHOL>  FLP looks just as good on the cres20 & he prefers this med...  GI>  Followed by DrKaplan, stable, continue current meds.  DJD, LBP, DDD>  Followed by Carles Collet w/ freq epid sterioid injections; on Vicodin, Mobic, Tramadol, Methocarbamol, GI protection...  Gout>  Uric looks good on allopurinol Rx...  Anxiety>  Aware, not currently on anxiolytic agent.Marland KitchenMarland Kitchen

## 2011-08-31 ENCOUNTER — Other Ambulatory Visit: Payer: Self-pay | Admitting: Pulmonary Disease

## 2011-12-10 ENCOUNTER — Other Ambulatory Visit: Payer: Self-pay | Admitting: *Deleted

## 2011-12-10 ENCOUNTER — Telehealth: Payer: Self-pay | Admitting: Pulmonary Disease

## 2011-12-10 MED ORDER — ALLOPURINOL 300 MG PO TABS: 300.0000 mg | ORAL_TABLET | Freq: Every day | ORAL | Status: DC

## 2011-12-10 NOTE — Telephone Encounter (Signed)
I spoke with pt and rx has been sent. Nothing further was needed

## 2011-12-31 ENCOUNTER — Telehealth: Payer: Self-pay | Admitting: Pulmonary Disease

## 2011-12-31 MED ORDER — SILDENAFIL CITRATE 100 MG PO TABS: 100.0000 mg | ORAL_TABLET | ORAL | Status: DC | PRN

## 2011-12-31 NOTE — Telephone Encounter (Signed)
I spoke with pt and he is requesting a rx for viagra and he wants the strongest dose. Pt stated please tell SN "I want to have a very happy birthday that is coming up next week". Please advise advised SN, thanks  [facility] [address]

## 2011-12-31 NOTE — Telephone Encounter (Signed)
Rx was sent to pharm  Pt aware and states nothing further needed 

## 2011-12-31 NOTE — Telephone Encounter (Signed)
Per SN---ok to give the viagra 100mg    #10  Use as directed and refill prn.  thanks

## 2012-02-25 ENCOUNTER — Encounter: Payer: Self-pay | Admitting: Pulmonary Disease

## 2012-02-25 ENCOUNTER — Ambulatory Visit (INDEPENDENT_AMBULATORY_CARE_PROVIDER_SITE_OTHER): Payer: Medicare Other | Admitting: Pulmonary Disease

## 2012-02-25 ENCOUNTER — Other Ambulatory Visit (INDEPENDENT_AMBULATORY_CARE_PROVIDER_SITE_OTHER): Payer: Medicare Other

## 2012-02-25 VITALS — BP 110/68 | HR 42 | Temp 97.0°F | Ht 74.0 in | Wt 206.6 lb

## 2012-02-25 DIAGNOSIS — I10 Essential (primary) hypertension: Secondary | ICD-10-CM

## 2012-02-25 DIAGNOSIS — E7800 Pure hypercholesterolemia, unspecified: Secondary | ICD-10-CM

## 2012-02-25 DIAGNOSIS — M545 Low back pain, unspecified: Secondary | ICD-10-CM

## 2012-02-25 DIAGNOSIS — N139 Obstructive and reflux uropathy, unspecified: Secondary | ICD-10-CM

## 2012-02-25 DIAGNOSIS — IMO0002 Reserved for concepts with insufficient information to code with codable children: Secondary | ICD-10-CM

## 2012-02-25 DIAGNOSIS — D126 Benign neoplasm of colon, unspecified: Secondary | ICD-10-CM

## 2012-02-25 DIAGNOSIS — K219 Gastro-esophageal reflux disease without esophagitis: Secondary | ICD-10-CM

## 2012-02-25 DIAGNOSIS — F411 Generalized anxiety disorder: Secondary | ICD-10-CM

## 2012-02-25 LAB — LIPID PANEL
Cholesterol: 135 mg/dL (ref 0–200)
Total CHOL/HDL Ratio: 2
Triglycerides: 81 mg/dL (ref 0.0–149.0)

## 2012-02-25 LAB — CBC WITH DIFFERENTIAL/PLATELET
Basophils Absolute: 0 10*3/uL (ref 0.0–0.1)
Basophils Relative: 0.6 % (ref 0.0–3.0)
Eosinophils Absolute: 0.2 10*3/uL (ref 0.0–0.7)
Lymphocytes Relative: 33.5 % (ref 12.0–46.0)
MCHC: 33 g/dL (ref 30.0–36.0)
Monocytes Absolute: 0.6 10*3/uL (ref 0.1–1.0)
Neutrophils Relative %: 56.6 % (ref 43.0–77.0)
Platelets: 173 10*3/uL (ref 150.0–400.0)
RBC: 4.98 Mil/uL (ref 4.22–5.81)

## 2012-02-25 LAB — BASIC METABOLIC PANEL
BUN: 17 mg/dL (ref 6–23)
CO2: 33 mEq/L — ABNORMAL HIGH (ref 19–32)
Chloride: 106 mEq/L (ref 96–112)
Creatinine, Ser: 1 mg/dL (ref 0.4–1.5)
Potassium: 4.5 mEq/L (ref 3.5–5.1)

## 2012-02-25 LAB — HEPATIC FUNCTION PANEL
ALT: 19 U/L (ref 0–53)
AST: 25 U/L (ref 0–37)
Bilirubin, Direct: 0.2 mg/dL (ref 0.0–0.3)
Total Protein: 7.1 g/dL (ref 6.0–8.3)

## 2012-02-25 LAB — PSA: PSA: 1.9 ng/mL (ref 0.10–4.00)

## 2012-02-25 LAB — TSH: TSH: 1.14 u[IU]/mL (ref 0.35–5.50)

## 2012-02-25 MED ORDER — TRAMADOL HCL 50 MG PO TABS: 50.0000 mg | ORAL_TABLET | Freq: Three times a day (TID) | ORAL | Status: DC | PRN

## 2012-02-25 MED ORDER — HYDROCHLOROTHIAZIDE 25 MG PO TABS: 25.0000 mg | ORAL_TABLET | Freq: Every day | ORAL | Status: DC

## 2012-02-25 NOTE — Progress Notes (Signed)
Subjective:    Patient ID: Brandon Haynes, male    DOB: [birth year], 71 y.o.   MRN: [id number]  HPI 71 y/o WM here for a follow up visit... he has multiple medical problems as noted below...   ~  2011:  incr LBP- saw DrHirsh w/ epid steroid shot & improved; rec for incr exercise & try to avoid surg since it would be extensive; also has Diclofenac, Vicodin, Flexeril...  saw DrMcLean 10/10 for f/u CP, HBP, Chol- added HCTZ25mg /d; BP is controlled on meds;  no further chest discomfort noted;  Chol looks good on Simva40;   ~  Feb 16, 2011:  60mo ROV & he continues to receive epid steroid shots per DrHirsh for relief of back & leg pains> now on Tramadol, Vicodin, Cyclobenz... Requesting Rx for Shingles vaccine... Medically stable> see prob list below; fasting labs today> all looks good...  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  BP controlled on Aten, Diovan, HCT & he denies CP, palpit, dizzy, SOB, edema; no cerebral ischemic symptoms on ASA daily; he called worried about what he read regarding Simva40 & we switched him to Crestor20- tol well & FLP today is improved...   ~  Feb 25, 2012:  60mo ROV & Brandon Haynes has been stable- no new complaints or concerns; working in his garden & feeling ok x for his back & leg pain- managed by Carles Collet & stable w/ epidural steroid shot 4/13 + Tramadol as needed;  Brandon Haynes has lost 12#on diet consisting of smaller portions & "eating right", his appetite remains good...    We reviewed prob list, meds, xrays and labs> see below>> LABS 5/13:  FLP- all parameters at goal on Cres20;  Chems- wnl;  CBC- wnl;  TSH=1.14;  PSA=1.90          Problem List:  HEARING LOSS (ICD-389.9) - eval by DrBates for ENT...  ALLERGIC RHINITIS (ICD-477.9) - eval by DrESL on allergy shots 2x/wk,  CLARITIN, & FLONASE as needed. ~  Allergy re-eval 8/12 by DrESL> on Claritin & Flonase but still uses decongestant spray Qhs & asked to stop this practice...  HYPERTENSION (ICD-401.9) - controlled on ATENOLOL 50mg /d,  DIOVAN 320mg /d, & HCTZ 25mg /d... ~  note: coronary art calcif seen on prev CT Chest in 2006... ~  CXR 9/10 showed chr asymmetric pl thickening right apex, calcif Ao, NAD.Marland Kitchen. ~  CXR 5/12 showed upper normal heart size 7 atherosclerotic Ao, clear lungs w/ granuloma left base, NAD... ~  11/12: BP= 142/82 today & reminded to take meds regularly> states BP checks at home are better than this; denies HA, fatigue, visual changes, CP, palipit, dizziness, syncope, dyspnea, edema, etc...  CHEST PAIN (ICD-786.50) - Apypical CP eval 9/10... ~  baseline EKG shows NSR, WNL.Marland Kitchen. ~  Cards eval DrMcLean 9/10 was neg... BP sl elevated & Diovan increased. ~  Myoview 9/10 showed no ischemia, no scar, EF= 58%...  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 he has known atherosclerotic changes seen on prev CT Neck 7/07 in the carotids w/o focal stenosis... note: coronary art calcif seen on prev CT Chest in 2006...  HYPERCHOLESTEROLEMIA (ICD-272.0) - prev on Vytorin 10/20; switched to Simva40 in 2009 for $$ reasons; then changed to CRESTOR 20mg /d ~9/12 at his request due to  something that he read about the Simvastatin; he knows to follow a low chol/ low fat diet & work on weight reduction... ~  FLP 8/08 on Vytor10-20 showed TChol 124, TG 155, HDL 41, LDL 52 ~  FLP 9/09 on Vytor10-20 showed TChol 132, TG 154, HDL 41, LDL 60 ~  FLP 2/10 on Simva40 showed TChol 146, TG 173, HDL 36, LDL 75 ~  FLP 9/10 on Simva40 showed TChol 137, TG 160, HDL 41, LDL 64 ~  FLP 5/11 on Simva40 showed TChol 155, TG 183, HDL 45, LDL 74 ~  FLP 5/12 on Simva40 showed TChol 142, TG 178, HDL 48, LDL 58... Needs better low fat diet! ~  FLP 11/12 on Cres20 showed TChol 158, TG 156, HDL 50, LDL 77 ~  FLP 5/13 on Cres20  showed TChol 135, TG 81, HDL 61, LDL 57  GERD (ICD-530.81) - on OMEPRAZOLE 20mg  Bid...  COLONIC POLYPS (ICD-211.3) ~  colonoscopy 10/04 by Dorris Singh showed several 1-53mm polyps = hyperplastic & one frag adenomatous...  ~  f/u colon 12/09 was neg- no polyps seen & f/u rec 41yrs...  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 ~  Fall10:  he has seen DrHirsh every few months & received several epid shots w/ temp relief... ~  11/11:  he's had 2 more epid steroid shots from DrHirsh & reports min relief... he continues to get his pain meds from [place]... he requests Tramadol instead of Diclofenac... we discussed poss pain clinic referral. ~  11/12:  Now taking Mobic, Robaxin, Neurontin, and off the Vicodin & Tramadol... ~  5/13:  He reports same pain med regimen per DrHirsh w/ epid steroid injections...   GOUT (ICD-274.9) - on ALLOPURINOL 300mg /d...  ~  labs 9/09 showed UricAcid level= 4.7 ~  Labs 5/12 showed Uric= 5.1  ANXIETY (ICD-300.00)  BASAL CELL CARCINOMA, HX OF (ICD-V10.83) - s/p removal of 2 basal cell Ca's from neck and left arm by DrTaffeen...  HEALTH MAINTENANCE:   ~  colon- done by [facility] 12/09 & neg- f/u 7yrs. ~  GU- PSA 11/11 was 2.13... ~  Immunization- had PNEUMOVAX 10/10 age 80... had 2010 Flu shot as well... had TDAP 5/11... Rx written for shingles vacc.   Past Surgical History  Procedure Date  . Back injection oct 2011, nov 2011    Outpatient Encounter Prescriptions as of 02/25/2012  Medication Sig Dispense Refill  . allopurinol (ZYLOPRIM) 300 MG tablet Take 1 tablet (300 mg total) by mouth daily.  90 tablet  3  . aspirin 81 MG tablet Take 81 mg by mouth daily.        Marland Kitchen atenolol (TENORMIN) 50 MG tablet Take 1  tablet (50 mg total) by mouth daily.  90 tablet  3  . fluticasone (FLONASE) 50 MCG/ACT nasal spray 2 sprays by Nasal route daily.        Marland Kitchen gabapentin (NEURONTIN) 300 MG capsule Take 300 mg by mouth 3 (three) times daily.        . hydrochlorothiazide 25 MG tablet Take 1 tablet (25 mg total) by mouth daily.  90 tablet  3  . loratadine (CLARITIN) 10 MG tablet Take 10 mg by mouth daily. As needed       . meloxicam (  MOBIC) 7.5 MG tablet Take 7.5 mg by mouth 2 (two) times daily.        . methocarbamol (ROBAXIN) 500 MG tablet Take 500 mg by mouth 2 (two) times daily.        Marland Kitchen omeprazole (PRILOSEC) 20 MG capsule TAKE 1 CAPSULE BY MOUTH TWICE DAILY A DAY 30 MINUTES BEFORE BREAKFAST AND DINNER  180 capsule  3  . rosuvastatin (CRESTOR) 20 MG tablet Take 1 tablet (20 mg total) by mouth daily.  90 tablet  3  . sildenafil (VIAGRA) 100 MG tablet Take 1 tablet (100 mg total) by mouth as needed for erectile dysfunction.  10 tablet  0  . valsartan (DIOVAN) 320 MG tablet Take 1 tablet (320 mg total) by mouth daily.  90 tablet  3    No Known Allergies   Current Medications, Allergies, Past Medical History, Past Surgical History, Family History, and Social History were reviewed in [employer] record.    Review of Systems         See HPI - all other systems neg except as noted... The patient complains of decreased hearing, dyspnea on exertion, and difficulty walking.  The patient denies anorexia, fever, weight loss, weight gain, vision loss, hoarseness, chest pain, syncope, peripheral edema, prolonged cough, headaches, hemoptysis, abdominal pain, melena, hematochezia, severe indigestion/heartburn, hematuria, incontinence, muscle weakness, suspicious skin lesions, transient blindness, depression, unusual weight change, abnormal bleeding, enlarged lymph nodes, and angioedema.     Objective:   Physical Exam     WD, WN, 71 y/o WM in NAD... GENERAL:  Alert & oriented; pleasant &  cooperative... HEENT:  [place]/AT, EOM-wnl, PERRLA, EACs-clear, TMs-wnl, NOSE-clear, THROAT-clear & wnl. NECK:  Supple w/ fairROM; no JVD; normal carotid impulses w/o bruits; no thyromegaly or nodules palpated; no lymphadenopathy. CHEST:  Clear to P & A; without wheezes/ rales/ or rhonchi heard... min tender chest wall... HEART:  Regular Rhythm; faint gr1/6 SEM apex, no rubs or gallops detected... ABDOMEN:  Soft & nontender; normal bowel sounds; no organomegaly or masses palpated. EXT: without deformities, mod arthritic changes; no varicose veins/+ venous insuffic/ no edema. NEURO:  CN's intact; no focal neuro deficits; SLR restricted w/ pain... DERM:  No lesions noted; no rash etc...  RADIOLOGY DATA:  Reviewed in the EPIC EMR & discussed w/ the patient...  LABORATORY DATA:  Reviewed in the EPIC EMR & discussed w/ the patient...   Assessment & Plan:   HBP>  Controlled on meds> BBlocker, ARB, Diuretic> continue same + better diet, get wt down.  ASPVD>  Need to address risk factors> contine ASA & BP meds/ Statin, get on diet, get wt down...  CHOL>  FLP looks just as good on the Cres20 & he prefers this med...  GI>  Followed by DrKaplan, stable, continue current meds.  DJD, LBP, DDD>  Followed by Carles Collet w/ freq epid sterioid injections; on Mobic, Methocarbamol, Neurontin...  Gout>  Uric looks good on Allopurinol Rx...  Anxiety>  Aware, not currently on anxiolytic agent...   Patient's Medications  New Prescriptions   TRAMADOL (ULTRAM) 50 MG TABLET    Take 1 tablet (50 mg total) by mouth 3 (three) times daily as needed for pain.  Previous Medications   ALLOPURINOL (ZYLOPRIM) 300 MG TABLET    Take 1 tablet (300 mg total) by mouth daily.   ASPIRIN 81 MG TABLET    Take 81 mg by mouth daily.     ATENOLOL (TENORMIN) 50 MG TABLET    Take 1  tablet (50 mg total) by mouth daily.   FLUTICASONE (FLONASE) 50 MCG/ACT NASAL SPRAY    2 sprays by Nasal route daily.     GABAPENTIN (NEURONTIN) 300 MG  CAPSULE    Take 300 mg by mouth 3 (three) times daily.     LORATADINE (CLARITIN) 10 MG TABLET    Take 10 mg by mouth daily. As needed    MELOXICAM (MOBIC) 7.5 MG TABLET    Take 7.5 mg by mouth 2 (two) times daily.     METHOCARBAMOL (ROBAXIN) 500 MG TABLET    Take 500 mg by mouth 2 (two) times daily.     OMEPRAZOLE (PRILOSEC) 20 MG CAPSULE    TAKE 1 CAPSULE BY MOUTH TWICE DAILY A DAY 30 MINUTES BEFORE BREAKFAST AND DINNER   ROSUVASTATIN (CRESTOR) 20 MG TABLET    Take 1 tablet (20 mg total) by mouth daily.   SILDENAFIL (VIAGRA) 100 MG TABLET    Take 1 tablet (100 mg total) by mouth as needed for erectile dysfunction.   VALSARTAN (DIOVAN) 320 MG TABLET    Take 1 tablet (320 mg total) by mouth daily.  Modified Medications   Modified Medication Previous Medication   HYDROCHLOROTHIAZIDE (HYDRODIURIL) 25 MG TABLET hydrochlorothiazide 25 MG tablet      Take 1 tablet (25 mg total) by mouth daily.    Take 1 tablet (25 mg total) by mouth daily.  Discontinued Medications   No medications on file

## 2012-02-25 NOTE — Patient Instructions (Signed)
Today we updated your med list in our EPIC system...    Continue your current medications the same...    We refilled your TRAMADOL per request...  Today we did your follow up FASTING blood work...  Call for any problems...  Stay as active as possible...  Let's plan a follow up visit in 6 months.Marland KitchenMarland Kitchen

## 2012-08-22 ENCOUNTER — Encounter: Payer: Self-pay | Admitting: *Deleted

## 2012-08-23 ENCOUNTER — Encounter: Payer: Self-pay | Admitting: Pulmonary Disease

## 2012-08-23 ENCOUNTER — Ambulatory Visit (INDEPENDENT_AMBULATORY_CARE_PROVIDER_SITE_OTHER): Payer: Medicare Other | Admitting: Pulmonary Disease

## 2012-08-23 VITALS — BP 114/78 | HR 56 | Temp 96.9°F | Ht 74.0 in | Wt 209.0 lb

## 2012-08-23 DIAGNOSIS — E7800 Pure hypercholesterolemia, unspecified: Secondary | ICD-10-CM

## 2012-08-23 DIAGNOSIS — IMO0002 Reserved for concepts with insufficient information to code with codable children: Secondary | ICD-10-CM

## 2012-08-23 DIAGNOSIS — I10 Essential (primary) hypertension: Secondary | ICD-10-CM

## 2012-08-23 DIAGNOSIS — J309 Allergic rhinitis, unspecified: Secondary | ICD-10-CM

## 2012-08-23 DIAGNOSIS — F411 Generalized anxiety disorder: Secondary | ICD-10-CM

## 2012-08-23 DIAGNOSIS — M545 Low back pain, unspecified: Secondary | ICD-10-CM

## 2012-08-23 DIAGNOSIS — K219 Gastro-esophageal reflux disease without esophagitis: Secondary | ICD-10-CM

## 2012-08-23 DIAGNOSIS — D126 Benign neoplasm of colon, unspecified: Secondary | ICD-10-CM

## 2012-08-23 DIAGNOSIS — I739 Peripheral vascular disease, unspecified: Secondary | ICD-10-CM

## 2012-08-23 NOTE — Patient Instructions (Addendum)
Today we updated your med list in our EPIC system...    Continue your current medications the same...  Stay as active as possible...  Call for any questions...  Let's plan a follow up visit in 6 months w/ FASTING blood work at that time.Brandon KitchenMarland Haynes

## 2012-08-23 NOTE — Progress Notes (Signed)
Subjective:     Patient ID: Brandon Haynes, male   DOB: December 30, 1940, 71 y.o.   MRN: [id number]  HPI Review of Systems   Physical Exam       Subjective:    Patient ID: Brandon Haynes, male    DOB: [birth year], 71 y.o.   MRN: [id number]  HPI 71 y/o WM here for a follow up visit... he has multiple medical problems as noted below...   ~  2011:  incr LBP- saw DrHirsh w/ epid steroid shot & improved; rec for incr exercise & try to avoid surg since it would be extensive; also has Diclofenac, Vicodin, Flexeril...  saw DrMcLean 10/10 for f/u CP, HBP, Chol- added HCTZ25mg /d; BP is controlled on meds;  no further chest discomfort noted;  Chol looks good on Simva40;   ~  Feb 16, 2011:  69mo ROV & he continues to receive epid steroid shots per DrHirsh for relief of back & leg pains> now on Tramadol, Vicodin, Cyclobenz... Requesting Rx for Shingles vaccine... Medically stable> see prob list below; fasting labs today> all looks good...  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  BP controlled on Aten, Diovan, HCT & he denies CP, palpit, dizzy, SOB, edema; no cerebral ischemic symptoms on ASA daily; he called worried about what he read regarding Simva40 & we switched him to Crestor20- tol well & FLP today is improved...   ~  Feb 25, 2012:  69mo ROV & Brandon Haynes has been stable- no new complaints or concerns; working in his garden & feeling ok x for his back & leg pain- managed by Carles Collet & stable w/ epidural steroid shot 4/13 + Tramadol as needed;  Brandon Haynes has lost 12#on diet consisting of smaller portions & "eating right", his appetite remains good...    We reviewed prob list, meds, xrays and labs> see below>> LABS 5/13:  FLP- all parameters at goal on Cres20;  Chems- wnl;  CBC- wnl;  TSH=1.14;  PSA=1.90  ~  August 23, 2012:  69mo ROV & Brandon Haynes has been stable, but notes incr left shoulder pain w/ eval by DrHilts, Ortho- Rx Voltaren Gel & Vicodin...     <AR> DrVanWinkle increased his allergy shots to twice/wk; +Claritin, Saline, Flonase, Patanase, etc...    <HBP> on Aten50, [id number]; Hct25; BP= 114/78; he denies CP, palpit, SOB, edema...    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    <Chol> on Cres20; last FLP 5/13 showed TChol 135, TG 81, HDL 61, LDL 57    <GERD> on Omep20Bid; he notes that this really helps his epig discomfort & he denies abd pain, n/v, c/d, or blood...    <DJD, LBP, Shoulder pain> on Allopurinol300, Neurontin300Tid, Mobic7.5, Tramadol50, Vicodin, Robaxin500; followed by DrHilts for Ortho...    <Anxiety> not currently on meds...    <Skin Ca> he's had several basal cells removed... We reviewed prob list, meds, xrays and labs> see below for updates >>           Problem List:  HEARING LOSS (ICD-389.9) - eval by DrBates for ENT...  ALLERGIC RHINITIS (ICD-477.9) - eval by DrESL/ VanWinkle on allergy shots 2x/wk, CLARITIN, & FLONASE as needed. ~  Allergy re-eval 8/12 by DrESL> on Claritin & Flonase but still uses  decongestant spray Qhs & asked to stop this practice...  HYPERTENSION (ICD-401.9) - controlled on ATENOLOL 50mg /d,  DIOVAN 320mg /d, & HCTZ 25mg /d... ~  note: coronary art calcif seen on prev CT Chest in 2006... ~  CXR 9/10 showed chr asymmetric pl thickening right apex, calcif Ao, NAD.Marland Kitchen. ~  CXR 5/12 showed upper normal heart size 7 atherosclerotic Ao, clear lungs w/ granuloma left base, NAD... ~  11/12: BP= 142/82 today & reminded to take meds regularly> states BP checks at home are better than this; denies HA, fatigue, visual changes, CP, palipit, dizziness, syncope, dyspnea, edema, etc... ~  11/13:  BP= 114/78 & he denies CP, palpit, SOB, edema...  CHEST PAIN (ICD-786.50) - Apypical CP eval 9/10... ~  baseline EKG shows NSR, WNL.Marland Kitchen. ~  Cards eval  DrMcLean 9/10 was neg... BP sl elevated & Diovan increased. ~  Myoview 9/10 showed no ischemia, no scar, EF= 58%...  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 he has known atherosclerotic changes seen on prev CT Neck 7/07 in the carotids w/o focal stenosis... note: coronary art calcif seen on prev CT Chest in 2006...  HYPERCHOLESTEROLEMIA (ICD-272.0) - prev on Vytorin 10/20; switched to Simva40 in 2009 for $$ reasons; then changed to CRESTOR 20mg /d ~9/12 at his request due to something that he read about the Simvastatin; he knows to follow a low chol/ low fat diet & work on weight reduction... ~  FLP 8/08 on Vytor10-20 showed TChol 124, TG 155, HDL 41, LDL 52 ~  FLP 9/09 on Vytor10-20 showed TChol 132, TG 154, HDL 41, LDL 60 ~  FLP 2/10 on Simva40 showed TChol 146, TG 173, HDL 36, LDL 75 ~  FLP 9/10 on Simva40 showed TChol 137, TG 160, HDL 41, LDL 64 ~  FLP 5/11 on Simva40 showed TChol 155, TG 183, HDL 45, LDL 74 ~  FLP 5/12 on Simva40 showed TChol 142, TG 178, HDL 48, LDL 58... Needs better low fat diet! ~  FLP 11/12 on Cres20 showed TChol 158, TG 156, HDL 50, LDL 77 ~  FLP 5/13 on Cres20 showed TChol 135, TG 81, HDL 61, LDL 57  GERD (ICD-530.81) - on OMEPRAZOLE 20mg  Bid...  COLONIC POLYPS (ICD-211.3) ~  colonoscopy 10/04 by Dorris Singh showed several 1-38mm polyps = hyperplastic & one frag adenomatous...  ~  f/u colon 12/09 was neg- no polyps seen & f/u rec 20yrs...  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 ~  Fall10:  he has seen DrHirsh every few months & received several epid shots w/ temp relief... ~  11/11:  he's had 2 more epid steroid shots from DrHirsh & reports min relief... he continues to get his pain  meds from [place]... he requests Tramadol instead of Diclofenac... we discussed poss pain clinic referral. ~  11/12:  Now taking Mobic, Robaxin, Neurontin, and off the Vicodin & Tramadol... ~  5/13:  He reports same pain med regimen per DrHirsh w/ epid steroid injections...   GOUT (ICD-274.9) - on ALLOPURINOL 300mg /d...  ~  labs 9/09 showed UricAcid level= 4.7 ~  Labs 5/12 showed Uric= 5.1  ANXIETY (ICD-300.00)  BASAL CELL CARCINOMA, HX OF (ICD-V10.83) - s/p removal of 2 basal cell Ca's from neck and left arm by DrTaffeen.Marland KitchenMarland Kitchen  HEALTH MAINTENANCE:   ~  colon- done by [facility] 12/09 & neg- f/u 80yrs. ~  GU- PSA 11/11 was 2.13... ~  Immunization- had PNEUMOVAX 10/10 age 45... had 2010 Flu shot as well... had TDAP 5/11... Rx written for shingles vacc.   Past Surgical History  Procedure Date  . Back injection oct 2011, nov 2011    Outpatient Encounter Prescriptions as of 08/23/2012  Medication Sig Dispense Refill  . allopurinol (ZYLOPRIM) 300 MG tablet Take 1 tablet (300 mg total) by mouth daily.  90 tablet  3  . aspirin 81 MG tablet Take 81 mg by mouth daily.        Marland Kitchen atenolol (TENORMIN) 50 MG tablet Take 1 tablet (50 mg total) by mouth daily.  90 tablet  3  . diclofenac sodium (VOLTAREN) 1 % GEL Apply to shoulder as needed      . gabapentin (NEURONTIN) 300 MG capsule Take 300 mg by mouth 3 (three) times daily.        . hydrochlorothiazide (HYDRODIURIL) 25 MG tablet Take 1 tablet (25 mg total) by mouth daily.  90 tablet  3  . loratadine (CLARITIN) 10 MG tablet Take 10 mg by mouth daily. As needed       . meloxicam (MOBIC) 7.5 MG tablet Take 7.5 mg by mouth 2 (two) times daily.        . methocarbamol (ROBAXIN) 500 MG tablet Take 500 mg by mouth 2 (two) times daily.        . Olopatadine HCl (PATANASE) 0.6 % SOLN 2 sprays in each nostil two times daily      . omeprazole (PRILOSEC) 20 MG capsule TAKE 1 CAPSULE BY MOUTH TWICE DAILY A DAY 30 MINUTES BEFORE BREAKFAST AND DINNER  180 capsule  3  .  rosuvastatin (CRESTOR) 20 MG tablet Take 1 tablet (20 mg total) by mouth daily.  90 tablet  3  . sildenafil (VIAGRA) 100 MG tablet Take 1 tablet (100 mg total) by mouth as needed for erectile dysfunction.  10 tablet  0  . traMADol (ULTRAM) 50 MG tablet Take 50 mg by mouth 3 (three) times daily as needed.      . valsartan (DIOVAN) 320 MG tablet Take 1 tablet (320 mg total) by mouth daily.  90 tablet  3  . [DISCONTINUED] fluticasone (FLONASE) 50 MCG/ACT nasal spray 2 sprays by Nasal route daily.          No Known Allergies   Current Medications, Allergies, Past Medical History, Past Surgical History, Family History, and Social History were reviewed in [employer] record.    Review of Systems         See HPI - all other systems neg except as noted... The patient complains of decreased hearing, dyspnea on exertion, and difficulty walking.  The patient denies anorexia, fever, weight loss, weight gain, vision loss, hoarseness, chest pain, syncope, peripheral edema, prolonged cough, headaches, hemoptysis, abdominal pain, melena, hematochezia, severe indigestion/heartburn, hematuria, incontinence, muscle weakness, suspicious skin lesions, transient blindness, depression, unusual weight change, abnormal bleeding, enlarged lymph nodes, and angioedema.     Objective:   Physical Exam     WD, WN, 71 y/o WM in NAD... GENERAL:  Alert & oriented; pleasant & cooperative... HEENT:  [place]/AT, EOM-wnl, PERRLA, EACs-clear, TMs-wnl, NOSE-clear, THROAT-clear & wnl. NECK:  Supple w/ fairROM; no JVD; normal carotid impulses w/o bruits; no thyromegaly or nodules palpated; no lymphadenopathy. CHEST:  Clear to P & A; without wheezes/ rales/ or rhonchi  heard... min tender chest wall... HEART:  Regular Rhythm; faint gr1/6 SEM apex, no rubs or gallops detected... ABDOMEN:  Soft & nontender; normal bowel sounds; no organomegaly or masses palpated. EXT: without deformities, mod arthritic changes; no  varicose veins/+ venous insuffic/ no edema. NEURO:  CN's intact; no focal neuro deficits; SLR restricted w/ pain... DERM:  No lesions noted; no rash etc...  RADIOLOGY DATA:  Reviewed in the EPIC EMR & discussed w/ the patient...  LABORATORY DATA:  Reviewed in the EPIC EMR & discussed w/ the patient...   Assessment & Plan:    HBP>  Controlled on meds> BBlocker, ARB, Diuretic> continue same + better diet, get wt down.  ASPVD>  Need to address risk factors> contine ASA & BP meds/ Statin, get on diet, get wt down...  CHOL>  FLP looks just as good on the Cres20 & he prefers this med...  GI>  Followed by DrKaplan, stable, continue current meds.  DJD, LBP, DDD>  Followed by Carles Collet w/ freq epid sterioid injections; on Mobic, Methocarbamol, Neurontin...  Gout>  Uric looks good on Allopurinol Rx...  Anxiety>  Aware, not currently on anxiolytic agent...   Patient's Medications  New Prescriptions   No medications on file  Previous Medications   ALLOPURINOL (ZYLOPRIM) 300 MG TABLET    Take 1 tablet (300 mg total) by mouth daily.   ASPIRIN 81 MG TABLET    Take 81 mg by mouth daily.     ATENOLOL (TENORMIN) 50 MG TABLET    Take 1 tablet (50 mg total) by mouth daily.   DICLOFENAC SODIUM (VOLTAREN) 1 % GEL    Apply to shoulder as needed   GABAPENTIN (NEURONTIN) 300 MG CAPSULE    Take 300 mg by mouth 3 (three) times daily.     HYDROCHLOROTHIAZIDE (HYDRODIURIL) 25 MG TABLET    Take 1 tablet (25 mg total) by mouth daily.   LORATADINE (CLARITIN) 10 MG TABLET    Take 10 mg by mouth daily. As needed    MELOXICAM (MOBIC) 7.5 MG TABLET    Take 7.5 mg by mouth 2 (two) times daily.     METHOCARBAMOL (ROBAXIN) 500 MG TABLET    Take 500 mg by mouth 2 (two) times daily.     OLOPATADINE HCL (PATANASE) 0.6 % SOLN    2 sprays in each nostil two times daily   OMEPRAZOLE (PRILOSEC) 20 MG CAPSULE    TAKE 1 CAPSULE BY MOUTH TWICE DAILY A DAY 30 MINUTES BEFORE BREAKFAST AND DINNER   ROSUVASTATIN (CRESTOR) 20 MG  TABLET    Take 1 tablet (20 mg total) by mouth daily.   SILDENAFIL (VIAGRA) 100 MG TABLET    Take 1 tablet (100 mg total) by mouth as needed for erectile dysfunction.   TRAMADOL (ULTRAM) 50 MG TABLET    Take 50 mg by mouth 3 (three) times daily as needed.   VALSARTAN (DIOVAN) 320 MG TABLET    Take 1 tablet (320 mg total) by mouth daily.  Modified Medications   No medications on file  Discontinued Medications   FLUTICASONE (FLONASE) 50 MCG/ACT NASAL SPRAY    2 sprays by Nasal route daily.

## 2012-09-23 ENCOUNTER — Telehealth: Payer: Self-pay | Admitting: Pulmonary Disease

## 2012-09-23 MED ORDER — ROSUVASTATIN CALCIUM 20 MG PO TABS: 20.0000 mg | ORAL_TABLET | Freq: Every day | ORAL | Status: DC

## 2012-09-23 MED ORDER — VALSARTAN 320 MG PO TABS: 320.0000 mg | ORAL_TABLET | Freq: Every day | ORAL | Status: DC

## 2012-09-23 NOTE — Telephone Encounter (Signed)
Spoke with patient, patient needing Diovan and Crestor sent into [facility].  Med list is current and patient to continue meds (last ov:08/23/12) Rx sent in, patient aware and nothing further needed at this time.

## 2012-09-27 ENCOUNTER — Other Ambulatory Visit: Payer: Self-pay | Admitting: *Deleted

## 2012-09-27 MED ORDER — ATENOLOL 50 MG PO TABS: 50.0000 mg | ORAL_TABLET | Freq: Every day | ORAL | Status: DC

## 2012-10-08 ENCOUNTER — Other Ambulatory Visit: Payer: Self-pay | Admitting: Pulmonary Disease

## 2012-10-29 HISTORY — PX: SHOULDER INJECTION: SHX5048

## 2012-11-07 ENCOUNTER — Other Ambulatory Visit: Payer: Self-pay | Admitting: Pulmonary Disease

## 2012-12-20 ENCOUNTER — Other Ambulatory Visit: Payer: Self-pay | Admitting: Pulmonary Disease

## 2012-12-23 ENCOUNTER — Other Ambulatory Visit: Payer: Self-pay | Admitting: Pulmonary Disease

## 2013-01-04 ENCOUNTER — Telehealth: Payer: Self-pay | Admitting: Pulmonary Disease

## 2013-01-04 MED ORDER — ALLOPURINOL 300 MG PO TABS: 300.0000 mg | ORAL_TABLET | Freq: Every day | ORAL | Status: DC

## 2013-01-04 NOTE — Telephone Encounter (Signed)
Rx has been sent in. 

## 2013-02-21 ENCOUNTER — Ambulatory Visit (INDEPENDENT_AMBULATORY_CARE_PROVIDER_SITE_OTHER): Payer: Medicare Other | Admitting: Pulmonary Disease

## 2013-02-21 ENCOUNTER — Ambulatory Visit (INDEPENDENT_AMBULATORY_CARE_PROVIDER_SITE_OTHER)
Admission: RE | Admit: 2013-02-21 | Discharge: 2013-02-21 | Disposition: A | Discharge status: Home / Self Care | Payer: Medicare Other | Source: Ambulatory Visit | Attending: Pulmonary Disease | Admitting: Pulmonary Disease

## 2013-02-21 ENCOUNTER — Encounter: Payer: Self-pay | Admitting: Pulmonary Disease

## 2013-02-21 ENCOUNTER — Other Ambulatory Visit (INDEPENDENT_AMBULATORY_CARE_PROVIDER_SITE_OTHER): Payer: Medicare Other

## 2013-02-21 VITALS — BP 122/76 | HR 60 | Temp 98.5°F | Ht 74.0 in | Wt 214.0 lb

## 2013-02-21 DIAGNOSIS — M545 Low back pain, unspecified: Secondary | ICD-10-CM

## 2013-02-21 DIAGNOSIS — D126 Benign neoplasm of colon, unspecified: Secondary | ICD-10-CM

## 2013-02-21 DIAGNOSIS — IMO0002 Reserved for concepts with insufficient information to code with codable children: Secondary | ICD-10-CM

## 2013-02-21 DIAGNOSIS — M109 Gout, unspecified: Secondary | ICD-10-CM

## 2013-02-21 DIAGNOSIS — I10 Essential (primary) hypertension: Secondary | ICD-10-CM

## 2013-02-21 DIAGNOSIS — J309 Allergic rhinitis, unspecified: Secondary | ICD-10-CM

## 2013-02-21 DIAGNOSIS — F411 Generalized anxiety disorder: Secondary | ICD-10-CM

## 2013-02-21 DIAGNOSIS — N320 Bladder-neck obstruction: Secondary | ICD-10-CM

## 2013-02-21 DIAGNOSIS — I739 Peripheral vascular disease, unspecified: Secondary | ICD-10-CM

## 2013-02-21 DIAGNOSIS — E7800 Pure hypercholesterolemia, unspecified: Secondary | ICD-10-CM

## 2013-02-21 DIAGNOSIS — Z85828 Personal history of other malignant neoplasm of skin: Secondary | ICD-10-CM

## 2013-02-21 DIAGNOSIS — K219 Gastro-esophageal reflux disease without esophagitis: Secondary | ICD-10-CM

## 2013-02-21 LAB — BASIC METABOLIC PANEL
BUN: 17 mg/dL (ref 6–23)
CO2: 28 mEq/L (ref 19–32)
GFR: 78.49 mL/min (ref >60.00)
Glucose, Bld: 85 mg/dL (ref 70–99)
Potassium: 4.2 mEq/L (ref 3.5–5.1)

## 2013-02-21 LAB — LIPID PANEL
Cholesterol: 136 mg/dL (ref 0–200)
VLDL: 16.8 mg/dL (ref 0.0–40.0)

## 2013-02-21 LAB — CBC WITH DIFFERENTIAL/PLATELET
Basophils Absolute: 0 10*3/uL (ref 0.0–0.1)
Eosinophils Relative: 1.3 % (ref 0.0–5.0)
HCT: 39.9 % (ref 39.0–52.0)
Lymphocytes Relative: 35 % (ref 12.0–46.0)
Monocytes Relative: 7 % (ref 3.0–12.0)
Neutrophils Relative %: 56.4 % (ref 43.0–77.0)
Platelets: 173 10*3/uL (ref 150.0–400.0)
RDW: 14.7 % — ABNORMAL HIGH (ref 11.5–14.6)
WBC: 7.2 10*3/uL (ref 4.5–10.5)

## 2013-02-21 LAB — HEPATIC FUNCTION PANEL
ALT: 18 U/L (ref 0–53)
AST: 26 U/L (ref 0–37)
Albumin: 4.5 g/dL (ref 3.5–5.2)
Alkaline Phosphatase: 40 U/L (ref 39–117)
Total Protein: 6.9 g/dL (ref 6.0–8.3)

## 2013-02-21 LAB — URIC ACID: Uric Acid, Serum: 4.3 mg/dL (ref 4.0–7.8)

## 2013-02-21 LAB — PSA: PSA: 1.77 ng/mL (ref 0.10–4.00)

## 2013-02-21 LAB — TSH: TSH: 1.08 u[IU]/mL (ref 0.35–5.50)

## 2013-02-21 NOTE — Patient Instructions (Addendum)
Today we updated your med list in our EPIC system...    Continue your current medications the same...  Today we did your follow up CXR & FASTING blood work...    We will contact you w/ the results when available...   Keep up the good work w/ diet & exercise as possible...  Call for any questions...  Marland KitchenLet's plan a follow up visit in 7mo, sooner if needed for problems.Marland KitchenMarland Kitchen

## 2013-02-21 NOTE — Progress Notes (Signed)
Subjective:     Patient ID: Brandon Haynes, male   DOB: May 18, 1941, 72 y.o.   MRN: [id number]  HPI  Review of Systems  Physical Exam  Subjective:    Patient ID: Brandon Haynes, male    DOB: 04/09/1941, 72 y.o.   MRN: [id number]  HPI 72 y/o WM here for a follow up visit... he has multiple medical problems as noted below...   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  BP controlled on Aten, Diovan, HCT & he denies CP, palpit, dizzy, SOB, edema; no cerebral ischemic symptoms on ASA daily; he called worried about what he read regarding Simva40 & we switched him to Crestor20- tol well & FLP today is improved...   ~  Feb 25, 2012:  70mo ROV & Brandon Haynes has been stable- no new complaints or concerns; working in his garden & feeling ok x for his back & leg pain- managed by Brandon Haynes & stable w/ epidural steroid shot 4/13 + Tramadol as needed;  [name] has lost 12#on diet consisting of smaller portions & "eating right", his appetite remains good...    We reviewed prob list, meds, xrays and labs> see below>> LABS 5/13:  FLP- all parameters at goal on Cres20;  Chems- wnl;  CBC- wnl;  TSH=1.14;  PSA=1.90  ~  August 23, 2012:  70mo ROV & Brandon Haynes has been stable, but notes incr left shoulder pain w/ eval by Brandon Haynes, Ortho- Rx Voltaren Gel & Vicodin...     AR> Brandon Haynes increased his allergy shots to twice/wk; +Claritin, Saline, Flonase, Patanase, etc...    HBP> on Aten50, [id number]; Hct25; BP= 114/78; he denies CP, palpit, SOB, edema...    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    Chol> on Cres20; last FLP 5/13 showed TChol 135, TG 81, HDL 61, LDL 57    GERD> on Omep20Bid; he notes that this really helps his epig discomfort & he denies abd pain, n/v, c/d, or blood...    DJD, LBP, Shoulder  pain> on Allopurinol300, Neurontin300Tid, Mobic7.5, Tramadol50, Vicodin, Robaxin500; followed by Brandon Haynes for Ortho...    Anxiety> not currently on meds...    Skin Ca> he's had several basal cells removed... We reviewed prob list, meds, xrays and labs> see below for updates >>   ~  Feb 21, 2013:  70mo ROV & Brandon Haynes has been stable overall- states he occas feels dizzy & weak in the legs; he runs out of energy late in the afternoon... We reviewed the following medical problems during today's office visit >>     AR> Brandon Haynes increased his allergy shots to twice/wk; +Claritin, Saline, Flonase, Patanase, etc...    HBP> on Aten50, [id number]; Hct25; BP= 1122/76; he denies CP, palpit, SOB, edema...    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    Chol> on Cres20; last FLP 5/14 shows TChol 136, TG 84, HDL 57, LDL 62    GERD> on Omep20Bid; he notes that this really helps his epig discomfort & he denies abd pain, n/v, c/d, or blood...    DJD, Gout, LBP, Shoulder pain> on Allopurinol300, Neurontin300Tid, Mobic7.5, Tramadol50, Vicodin, Robaxin500; followed by Brandon Haynes for Ortho & Brandon Haynes for NS (last ESI was 4/14).Marland KitchenMarland Kitchen  Anxiety> not currently on meds...    Skin Ca> he's had several basal cells removed... We reviewed prob list, meds, xrays and labs> see below for updates >>  CXR 5/14 showed normal heart size, clear lungs w/ biapical pleural thickening, NAD... LABS 5/14:  FLP- wnl;  CBC- wnl;  TSH=1.08;  Uric=4.3 on Allopurinol300;  PSA=1.77...           Problem List:  HEARING LOSS (ICD-389.9) - eval by Brandon Haynes for ENT...  ALLERGIC RHINITIS (ICD-477.9) - eval by Brandon Haynes/ [place] on allergy shots 2x/wk, CLARITIN, & FLONASE as needed. ~  Allergy re-eval 8/12 by Brandon Haynes> on Claritin & Flonase but still uses decongestant spray Qhs & asked to stop this practice...  HYPERTENSION (ICD-401.9) - controlled on ATENOLOL 50mg /d,  DIOVAN 320mg /d, & HCTZ 25mg /d... ~  note:  coronary art calcif seen on prev CT Chest in 2006... ~  CXR 9/10 showed chr asymmetric pl thickening right apex, calcif Ao, NAD.Marland Kitchen. ~  CXR 5/12 showed upper normal heart size & atherosclerotic Ao, clear lungs w/ granuloma left base, NAD... ~  11/12: BP= 142/82 today & reminded to take meds regularly> states BP checks at home are better than this; denies HA, fatigue, visual changes, CP, palipit, dizziness, syncope, dyspnea, edema, etc... ~  11/13:  BP= 114/78 & he denies CP, palpit, SOB, edema... ~  CXR 5/14 showed normal heart size, clear lungs w/ biapical pleural thickening, NAD;  on Aten50, [id number]; Hct25; BP= 1122/76; he denies CP, palpit, SOB, edema.  CHEST PAIN (ICD-786.50) - Apypical CP eval 9/10... ~  baseline EKG shows NSR, WNL.Marland Kitchen. ~  Cards eval Brandon Haynes 9/10 was neg... BP sl elevated & Diovan increased. ~  Myoview 9/10 showed no ischemia, no scar, EF= 58%...  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 he has known atherosclerotic changes seen on prev CT Neck 7/07 in the carotids w/o focal stenosis... note: coronary art calcif seen on prev CT Chest in 2006...  HYPERCHOLESTEROLEMIA (ICD-272.0) - prev on Vytorin 10/20; switched to Simva40 in 2009 for $$ reasons; then changed to CRESTOR 20mg /d ~9/12 at his request due to something that he read about the Simvastatin; he knows to follow a low chol/ low fat diet & work on weight reduction... ~  FLP 8/08 on Vytor10-20 showed TChol 124, TG 155, HDL 41, LDL 52 ~  FLP 9/09 on Vytor10-20 showed TChol 132, TG 154, HDL 41, LDL 60 ~  FLP 2/10 on Simva40 showed TChol 146, TG 173, HDL 36, LDL 75 ~  FLP 9/10 on Simva40 showed TChol 137, TG 160, HDL 41, LDL 64 ~  FLP 5/11 on Simva40 showed TChol 155, TG 183, HDL 45, LDL 74 ~  FLP 5/12 on Simva40 showed TChol 142, TG 178, HDL 48, LDL 58... Needs better low fat diet! ~  FLP 11/12 on Cres20 showed TChol 158, TG 156, HDL 50, LDL 77 ~  FLP 5/13 on Cres20 showed TChol 135, TG 81, HDL 61, LDL  57 ~  FLP 5/14 on Cres20 showed TChol 136, TG 84, HDL 57, LDL 62   GERD (ICD-530.81) - on OMEPRAZOLE 20mg  Bid...  COLONIC POLYPS (ICD-211.3) ~  colonoscopy 10/04 by Dorris Singh showed several 1-16mm polyps = hyperplastic & one frag adenomatous...  ~  f/u colon 12/09 was neg- no polyps seen & f/u rec 36yrs...  DEGENERATIVE DISC DISEASE (ICD-722.6) BACK PAIN, LUMBAR (ICD-724.2)  LEG PAIN (Neuropathy) He has a long-term relationship w/ Brandon Haynes; he has degen disc disease at all levels of the lumbar  spine> s/p shots, no surgery; currently taking MOBIC 7.5mg  up to 2/d, & ROBAXIN 500mg Bid Prn & NEURONTIN 300mg  tid... ~  Fall10:  he has seen Brandon Haynes every few months & received several epid shots w/ temp relief... ~  11/11:  he's had 2 more epid steroid shots from Brandon Haynes & reports min relief... he continues to get his pain meds from [place]... he requests Tramadol instead of Diclofenac... we discussed poss pain clinic referral. ~  MRI Lumbar spine 10/12 showed new disc herniation at L4-5 compressing L4 nerve root, the rest of the study similar to 2008... ~  11/12:  Now taking Mobic, Robaxin, Neurontin, and off the Vicodin & Tramadol... ~  5/13:  He reports same pain med regimen per Brandon Haynes w/ epid steroid injections...  ~  3/14:  He had f/u Brandon Haynes> stable on meds & exercise program=> he received another ESI injection 4/14...  GOUT (ICD-274.9) - on ALLOPURINOL 300mg /d...  ~  labs 9/09 showed UricAcid level= 4.7 ~  Labs 5/12 showed Uric= 5.1 ~  Labs 5/14 on Allopurinol300showed Uric= 4.3  ANXIETY (ICD-300.00)  BASAL CELL CARCINOMA, HX OF (ICD-V10.83) - s/p removal of 2 basal cell Ca's from neck and left arm by DrTaffeen...  HEALTH MAINTENANCE:   ~  colon- done by [facility] 12/09 & neg- f/u 62yrs. ~  GU- PSA 11/11 was 2.13... ~  Immunization- had PNEUMOVAX 10/10 age 41... had 2010 Flu shot as well... had TDAP 5/11... Rx written for shingles vacc.   Past Surgical History  Procedure Date  . Back  injection oct 2011, nov 2011    Outpatient Encounter Prescriptions as of 02/21/2013  Medication Sig Dispense Refill  . allopurinol (ZYLOPRIM) 300 MG tablet Take 1 tablet (300 mg total) by mouth daily.  90 tablet  3  . aspirin 81 MG tablet Take 81 mg by mouth daily.        Marland Kitchen atenolol (TENORMIN) 50 MG tablet Take 1 tablet (50 mg total) by mouth daily.  90 tablet  3  . diclofenac sodium (VOLTAREN) 1 % GEL Apply to shoulder as needed      . gabapentin (NEURONTIN) 300 MG capsule Take 300 mg by mouth 4 (four) times daily.       . hydrochlorothiazide (HYDRODIURIL) 25 MG tablet Take 1 tablet (25 mg total) by mouth daily.  90 tablet  3  . loratadine (CLARITIN) 10 MG tablet Take 10 mg by mouth daily. As needed       . meloxicam (MOBIC) 7.5 MG tablet Take 7.5 mg by mouth 2 (two) times daily.        . methocarbamol (ROBAXIN) 500 MG tablet Take 500 mg by mouth 2 (two) times daily.        . Olopatadine HCl (PATANASE) 0.6 % SOLN 2 sprays in each nostil two times daily      . omeprazole (PRILOSEC) 20 MG capsule TAKE 1 CAPSULE BY MOUTH TWICE DAILY A DAY 30 MINUTES BEFORE BREAKFAST AND DINNER  180 capsule  0  . rosuvastatin (CRESTOR) 20 MG tablet Take 1 tablet (20 mg total) by mouth daily.  90 tablet  3  . sildenafil (VIAGRA) 100 MG tablet Take 1 tablet (100 mg total) by mouth as needed for erectile dysfunction.  10 tablet  0  . traMADol (ULTRAM) 50 MG tablet TAKE 1 TABLET BY MOUTH THREE TIMES DAILY AS NEEDED FOR PAIN  90 tablet  2  . valsartan (DIOVAN) 320 MG tablet Take 1 tablet (320 mg total) by mouth  daily.  90 tablet  3  . [DISCONTINUED] traMADol (ULTRAM) 50 MG tablet Take 50 mg by mouth 3 (three) times daily as needed.       No facility-administered encounter medications on file as of 02/21/2013.    No Known Allergies   Current Medications, Allergies, Past Medical History, Past Surgical History, Family History, and Social History were reviewed in [employer] record.    Review  of Systems         See HPI - all other systems neg except as noted... The patient complains of decreased hearing, dyspnea on exertion, and difficulty walking.  The patient denies anorexia, fever, weight loss, weight gain, vision loss, hoarseness, chest pain, syncope, peripheral edema, prolonged cough, headaches, hemoptysis, abdominal pain, melena, hematochezia, severe indigestion/heartburn, hematuria, incontinence, muscle weakness, suspicious skin lesions, transient blindness, depression, unusual weight change, abnormal bleeding, enlarged lymph nodes, and angioedema.     Objective:   Physical Exam     WD, WN, 72 y/o WM in NAD... GENERAL:  Alert & oriented; pleasant & cooperative... HEENT:  [place]/AT, EOM-wnl, PERRLA, EACs-clear, TMs-wnl, NOSE-clear, THROAT-clear & wnl. NECK:  Supple w/ fairROM; no JVD; normal carotid impulses w/o bruits; no thyromegaly or nodules palpated; no lymphadenopathy. CHEST:  Clear to P & A; without wheezes/ rales/ or rhonchi heard... min tender chest wall... HEART:  Regular Rhythm; faint gr1/6 SEM apex, no rubs or gallops detected... ABDOMEN:  Soft & nontender; normal bowel sounds; no organomegaly or masses palpated. EXT: without deformities, mod arthritic changes; no varicose veins/+ venous insuffic/ no edema. NEURO:  CN's intact; no focal neuro deficits; SLR restricted w/ pain... DERM:  No lesions noted; no rash etc...  RADIOLOGY DATA:  Reviewed in the EPIC EMR & discussed w/ the patient...  LABORATORY DATA:  Reviewed in the EPIC EMR & discussed w/ the patient...   Assessment & Plan:    HBP>  Controlled on meds> BBlocker, ARB, Diuretic> continue same + better diet, get wt down.  ASPVD>  Need to address risk factors> contine ASA & BP meds/ Statin, get on diet, get wt down...  CHOL>  FLP looks just as good on the Cres20 & he prefers this med...  GI>  Followed by DrKaplan, stable, continue current meds.  DJD, LBP, DDD>  Followed by Brandon Haynes w/ freq epid  sterioid injections; on Mobic, Methocarbamol, Neurontin...  Gout>  Uric looks good on Allopurinol Rx...  Anxiety>  Aware, not currently on anxiolytic agent...   Patient's Medications  New Prescriptions   No medications on file  Previous Medications   ALLOPURINOL (ZYLOPRIM) 300 MG TABLET    Take 1 tablet (300 mg total) by mouth daily.   ASPIRIN 81 MG TABLET    Take 81 mg by mouth daily.     ATENOLOL (TENORMIN) 50 MG TABLET    Take 1 tablet (50 mg total) by mouth daily.   DICLOFENAC SODIUM (VOLTAREN) 1 % GEL    Apply to shoulder as needed   GABAPENTIN (NEURONTIN) 300 MG CAPSULE    Take 300 mg by mouth 4 (four) times daily.    HYDROCHLOROTHIAZIDE (HYDRODIURIL) 25 MG TABLET    Take 1 tablet (25 mg total) by mouth daily.   LORATADINE (CLARITIN) 10 MG TABLET    Take 10 mg by mouth daily. As needed    MELOXICAM (MOBIC) 7.5 MG TABLET    Take 7.5 mg by mouth 2 (two) times daily.     METHOCARBAMOL (ROBAXIN) 500 MG TABLET    Take  500 mg by mouth 2 (two) times daily.     OLOPATADINE HCL (PATANASE) 0.6 % SOLN    2 sprays in each nostil two times daily   OMEPRAZOLE (PRILOSEC) 20 MG CAPSULE    TAKE 1 CAPSULE BY MOUTH TWICE DAILY A DAY 30 MINUTES BEFORE BREAKFAST AND DINNER   ROSUVASTATIN (CRESTOR) 20 MG TABLET    Take 1 tablet (20 mg total) by mouth daily.   SILDENAFIL (VIAGRA) 100 MG TABLET    Take 1 tablet (100 mg total) by mouth as needed for erectile dysfunction.   TRAMADOL (ULTRAM) 50 MG TABLET    TAKE 1 TABLET BY MOUTH THREE TIMES DAILY AS NEEDED FOR PAIN   VALSARTAN (DIOVAN) 320 MG TABLET    Take 1 tablet (320 mg total) by mouth daily.  Modified Medications   No medications on file  Discontinued Medications   TRAMADOL (ULTRAM) 50 MG TABLET    Take 50 mg by mouth 3 (three) times daily as needed.

## 2013-02-22 ENCOUNTER — Telehealth: Payer: Self-pay | Admitting: Pulmonary Disease

## 2013-02-22 NOTE — Telephone Encounter (Signed)
Please notify patient>    FLP looks good on Cres20- continue same...   Chems, LFTs, CBC, Thyroid, PSA, & Uric> ALL WNL & looking good...      Attached to   Spoke with pt and notified of results per Dr. Kriste Basque. Pt verbalized understanding and denied any questions.

## 2013-02-22 NOTE — Progress Notes (Signed)
Quick Note:  Pt aware ______ 

## 2013-02-22 NOTE — Telephone Encounter (Signed)
Pt returned call. Brandon Haynes °

## 2013-02-22 NOTE — Telephone Encounter (Signed)
Called and lmomtcb for the pt to discuss his lab results.

## 2013-03-02 ENCOUNTER — Telehealth: Payer: Self-pay | Admitting: Pulmonary Disease

## 2013-03-02 NOTE — Telephone Encounter (Signed)
Called and spoke with pt and he is aware of lab results per SN and pt voiced his understanding and nothing further is needed.

## 2013-03-20 ENCOUNTER — Other Ambulatory Visit: Payer: Self-pay | Admitting: Pulmonary Disease

## 2013-04-02 ENCOUNTER — Other Ambulatory Visit: Payer: Self-pay | Admitting: Pulmonary Disease

## 2013-04-04 ENCOUNTER — Telehealth: Payer: Self-pay | Admitting: Pulmonary Disease

## 2013-04-04 MED ORDER — HYDROCHLOROTHIAZIDE 25 MG PO TABS: 25.0000 mg | ORAL_TABLET | Freq: Every day | ORAL | Status: DC

## 2013-04-04 NOTE — Telephone Encounter (Signed)
Called and spoke with pt and he requested that rx for the hctz [facility]ills of this medication.  Pt is aware that this rx has been sent and nothing further is needed.

## 2013-04-29 ENCOUNTER — Other Ambulatory Visit: Payer: Self-pay | Admitting: Pulmonary Disease

## 2013-06-19 ENCOUNTER — Other Ambulatory Visit: Payer: Self-pay | Admitting: Family Medicine

## 2013-06-19 DIAGNOSIS — M25512 Pain in left shoulder: Secondary | ICD-10-CM

## 2013-06-25 ENCOUNTER — Ambulatory Visit
Admission: RE | Admit: 2013-06-25 | Discharge: 2013-06-25 | Disposition: A | Discharge status: Home / Self Care | Payer: Medicare Other | Source: Ambulatory Visit | Attending: Family Medicine | Admitting: Family Medicine

## 2013-06-25 DIAGNOSIS — M25512 Pain in left shoulder: Secondary | ICD-10-CM

## 2013-07-04 ENCOUNTER — Ambulatory Visit: Payer: Self-pay

## 2013-08-01 ENCOUNTER — Other Ambulatory Visit: Payer: Self-pay | Admitting: Pulmonary Disease

## 2013-08-04 ENCOUNTER — Ambulatory Visit (INDEPENDENT_AMBULATORY_CARE_PROVIDER_SITE_OTHER): Payer: Medicare Other

## 2013-08-04 VITALS — BP 116/72 | HR 72 | Resp 18

## 2013-08-04 DIAGNOSIS — B351 Tinea unguium: Secondary | ICD-10-CM

## 2013-08-04 NOTE — Patient Instructions (Signed)

## 2013-08-04 NOTE — Progress Notes (Signed)
  Subjective:    Patient ID: Brandon Haynes, male    DOB: Apr 15, 1941, 72 y.o.   MRN: [id number]  HPI trim nails and check this 4th toe on left that the doctor took wart off due to i am doing a lot of walking over the Thanksgiving Holiday    Review of Systems  Constitutional: Negative.   HENT: Negative.   Eyes: Negative.   Respiratory: Negative.   Cardiovascular: Negative.   Gastrointestinal: Negative.   Endocrine: Negative.   Genitourinary: Negative.   Musculoskeletal: Positive for back pain.  Skin: Negative.   Allergic/Immunologic: Negative.   Neurological: Negative.   Hematological: Bruises/bleeds easily.  Psychiatric/Behavioral: Negative.        Objective:   Physical Exam Neurovascular status is intact pedal pulses palpable DP postal for PT posterior were for. No recurrence of keratoses fourth toe left foot at this time. Nails thick brittle crumbly friable nonpainful or symptomatic. Patient been applying topical formula 3 as instructed for the past month or more. Remainder of exam unremarkable mild semirigid digital contractures noted mild varicosities noted no edema no secondary infection is noted. No changes medication her health status noted       Assessment & Plan:  Resolve her carport keratoses fourth toe left foot no signs of infection noted.  Onychomycosis toes one through 5 bilateral nonpainful or symptomatic brittle crumbly discolored texture per patient request at this time nails debrided x10 Neosporin applied to the first left following debridement. At this time patient advised to continue with topical formula 3 application twice daily for 86-12 months duration. May return in an as-needed basis for palliative care. Is advised nail care may be an uncovered service.  Alvan Dame DPM

## 2013-08-28 ENCOUNTER — Encounter: Payer: Self-pay | Admitting: Pulmonary Disease

## 2013-08-28 ENCOUNTER — Ambulatory Visit (INDEPENDENT_AMBULATORY_CARE_PROVIDER_SITE_OTHER): Payer: Medicare Other | Admitting: Pulmonary Disease

## 2013-08-28 VITALS — BP 106/60 | HR 55 | Temp 97.7°F | Ht 74.0 in | Wt 222.2 lb

## 2013-08-28 DIAGNOSIS — M75102 Unspecified rotator cuff tear or rupture of left shoulder, not specified as traumatic: Secondary | ICD-10-CM

## 2013-08-28 DIAGNOSIS — I10 Essential (primary) hypertension: Secondary | ICD-10-CM

## 2013-08-28 DIAGNOSIS — IMO0002 Reserved for concepts with insufficient information to code with codable children: Secondary | ICD-10-CM

## 2013-08-28 DIAGNOSIS — M75100 Unspecified rotator cuff tear or rupture of unspecified shoulder, not specified as traumatic: Secondary | ICD-10-CM | POA: Insufficient documentation

## 2013-08-28 DIAGNOSIS — M109 Gout, unspecified: Secondary | ICD-10-CM

## 2013-08-28 DIAGNOSIS — K219 Gastro-esophageal reflux disease without esophagitis: Secondary | ICD-10-CM

## 2013-08-28 DIAGNOSIS — M545 Low back pain, unspecified: Secondary | ICD-10-CM

## 2013-08-28 DIAGNOSIS — F411 Generalized anxiety disorder: Secondary | ICD-10-CM

## 2013-08-28 DIAGNOSIS — D126 Benign neoplasm of colon, unspecified: Secondary | ICD-10-CM

## 2013-08-28 DIAGNOSIS — E7800 Pure hypercholesterolemia, unspecified: Secondary | ICD-10-CM

## 2013-08-28 DIAGNOSIS — J309 Allergic rhinitis, unspecified: Secondary | ICD-10-CM

## 2013-08-28 DIAGNOSIS — I739 Peripheral vascular disease, unspecified: Secondary | ICD-10-CM

## 2013-08-28 HISTORY — DX: Unspecified rotator cuff tear or rupture of unspecified shoulder, not specified as traumatic: M75.100

## 2013-08-28 NOTE — Progress Notes (Signed)
Subjective:     Patient ID: Brandon Haynes, male   DOB: Nov 06, 1940, 72 y.o.   MRN: [id number]  HPI  Review of Systems  Physical Exam  Subjective:    Patient ID: Brandon Haynes, male    DOB: [birth year], 72 y.o.   MRN: [id number]  HPI 72 y/o WM here for a follow up visit... he has multiple medical problems as noted below...   ~  Feb 25, 2012:  24mo ROV & [place] has been stable- no new complaints or concerns; working in his garden & feeling ok x for his back & leg pain- managed by Carles Collet & stable w/ epidural steroid shot 4/13 + Tramadol as needed;  Brandon Haynes has lost 12#on diet consisting of smaller portions & "eating right", his appetite remains good...    We reviewed prob list, meds, xrays and labs> see below>> LABS 5/13:  FLP- all parameters at goal on Cres20;  Chems- wnl;  CBC- wnl;  TSH=1.14;  PSA=1.90  ~  August 23, 2012:  24mo ROV & Cosme has been stable, but notes incr left shoulder pain w/ eval by DrHilts, Ortho- Rx Voltaren Gel & Vicodin...     AR> DrVanWinkle increased his allergy shots to twice/wk; +Claritin, Saline, Flonase, Patanase, etc...    HBP> on Aten50, [id number]; Hct25; BP= 114/78; he denies CP, palpit, SOB, edema...    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    Chol> on Cres20; last FLP 5/13 showed TChol 135, TG 81, HDL 61, LDL 57    GERD> on Omep20Bid; he notes that this really helps his epig discomfort & he denies abd pain, n/v, c/d, or blood...    DJD, LBP, Shoulder pain> on Allopurinol300, Neurontin300Tid, Mobic7.5, Tramadol50, Vicodin, Robaxin500; followed by DrHilts for Ortho...    Anxiety> not currently on meds...    Skin Ca> he's had several basal cells removed... We reviewed prob list, meds, xrays and labs> see below for updates >>   ~  Feb 21, 2013:  24mo ROV & Brandon Haynes has been stable overall- states he occas feels dizzy & weak in the legs; he runs out of energy late in the afternoon... We reviewed the following medical problems during today's  office visit >>     AR> DrVanWinkle increased his allergy shots to twice/wk; +Claritin, Saline, Flonase, Patanase, etc...    HBP> on Aten50, [id number]; Hct25; BP= 1122/76; he denies CP, palpit, SOB, edema...    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    Chol> on Cres20; last FLP 5/14 shows TChol 136, TG 84, HDL 57, LDL 62    GERD> on Omep20Bid; he notes that this really helps his epig discomfort & he denies abd pain, n/v, c/d, or blood...    DJD, Gout, LBP, Shoulder pain> on Allopurinol300, Neurontin300Tid, Mobic7.5, Tramadol50, Vicodin, Robaxin500; followed by DrHilts for Ortho & DrHirsh for NS (last ESI was 4/14)...    Anxiety> not currently on meds...    Skin Ca> he's had several basal cells removed... We reviewed prob list, meds, xrays and labs> see below for updates >>  CXR 5/14 showed normal heart size, clear lungs w/ biapical pleural thickening, NAD... LABS 5/14:  FLP- wnl;  CBC- wnl;  TSH=1.08;  Uric=4.3 on Allopurinol300;  PSA=1.77...  ~  August 28, 2013:  24mo ROV & Brandon Haynes tells me he needs shoulder surgery- he has seen DrBlackmon but hasn't sched the surg yet; he had an MRI of  the left shoulder done by [facility] 9/14 showing a full-thickness retracted rotator cuff tear involving the  infraspinatus and supraspinatus tendons, plus long head biceps tendinopathy w/o tear, and advanced AC joint arthropathy; he is taking Mobic7.5, Tramadol, Robaxin, VoltarenGel, etc...    AR> DrVanWinkle prev increased his allergy shots to twice/wk; +Claritin, Saline, Flonase, Patanase, etc and he is improved...    HBP> on Aten50, [id number]; Hct25; BP= 106/60; he denies CP, palpit, SOB, edema...    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    Chol> on Cres20; last FLP 5/14 shows TChol 136, TG 84, HDL 57, LDL 62    GERD> on Omep20Bid; he notes that this really helps his epig discomfort & he denies abd pain, n/v, c/d,  or blood...    DJD, Gout, LBP, Shoulder pain> on Allopurinol300, Neurontin300Tid, Mobic7.5, Tramadol50, Robaxin500; followed by DrHilts for Ortho & DrHirsh for NS (last ESI for LBP was 4/14)...    Anxiety> not currently on meds...    Skin Ca> he's had several basal cells removed... We reviewed prob list, meds, xrays and labs> see below for updates >>  Pt indicates that they will do CXR, EKG, & lab work prior to the planned surg on his left shoulder...           Problem List:  HEARING LOSS (ICD-389.9) - eval by DrBates for ENT...  ALLERGIC RHINITIS (ICD-477.9) - eval by DrESL/ VanWinkle on allergy shots 2x/wk, CLARITIN, & FLONASE as needed. ~  Allergy re-eval 8/12 by DrESL> on Claritin & Flonase but still uses decongestant spray Qhs & asked to stop this practice...  HYPERTENSION (ICD-401.9) - controlled on ATENOLOL 50mg /d,  DIOVAN 320mg /d, & HCTZ 25mg /d... ~  note: coronary art calcif seen on prev CT Chest in 2006... ~  CXR 9/10 showed chr asymmetric pl thickening right apex, calcif Ao, NAD.Marland Kitchen. ~  CXR 5/12 showed upper normal heart size & atherosclerotic Ao, clear lungs w/ granuloma left base, NAD... ~  11/12: BP= 142/82 today & reminded to take meds regularly> states BP checks at home are better than this; denies HA, fatigue, visual changes, CP, palipit, dizziness, syncope, dyspnea, edema, etc... ~  11/13:  BP= 114/78 & he denies CP, palpit, SOB, edema... ~  CXR 5/14 showed normal heart size, clear lungs w/ biapical pleural thickening, NAD;  on Aten50, [id number]; Hct25; BP= 1122/76; he denies CP, palpit, SOB, edema. ~  12/14: on Aten50, [id number]; Hct25; BP= 106/60; he denies CP, palpit, SOB, edema.  CHEST PAIN (ICD-786.50) - Apypical CP eval 9/10... ~  baseline EKG shows NSR, WNL.Marland Kitchen. ~  Cards eval DrMcLean 9/10 was neg... BP sl elevated & Diovan increased. ~  Myoview 9/10 showed no ischemia, no scar, EF= 58%...  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 he has known  atherosclerotic changes seen on prev CT Neck 7/07 in the carotids w/o focal stenosis... note: coronary art calcif seen on prev CT Chest in 2006...  HYPERCHOLESTEROLEMIA (ICD-272.0) - prev on Vytorin 10/20; switched to Simva40 in 2009 for $$ reasons; then changed to CRESTOR 20mg /d ~9/12 at his request due to something that he read about the Simvastatin; he knows to follow a low chol/ low fat diet & work on weight reduction... ~  FLP 8/08 on Vytor10-20 showed TChol 124, TG 155, HDL 41, LDL 52 ~  FLP 9/09 on Vytor10-20 showed TChol 132, TG 154, HDL 41, LDL 60 ~  FLP 2/10 on Simva40 showed TChol 146,  TG 173, HDL 36, LDL 75 ~  FLP 9/10 on Simva40 showed TChol 137, TG 160, HDL 41, LDL 64 ~  FLP 5/11 on Simva40 showed TChol 155, TG 183, HDL 45, LDL 74 ~  FLP 5/12 on Simva40 showed TChol 142, TG 178, HDL 48, LDL 58... Needs better low fat diet! ~  FLP 11/12 on Cres20 showed TChol 158, TG 156, HDL 50, LDL 77 ~  FLP 5/13 on Cres20 showed TChol 135, TG 81, HDL 61, LDL 57 ~  FLP 5/14 on Cres20 showed TChol 136, TG 84, HDL 57, LDL 62   GERD (ICD-530.81) - on OMEPRAZOLE 20mg  Bid...  COLONIC POLYPS (ICD-211.3) ~  colonoscopy 10/04 by Dorris Singh showed several 1-64mm polyps = hyperplastic & one frag adenomatous...  ~  f/u colon 12/09 was neg- no polyps seen & f/u rec 45yrs...  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 ~  Fall10:  he has seen DrHirsh every few months & received several epid shots w/ temp relief... ~  11/11:  he's had 2 more epid steroid shots from DrHirsh & reports min relief... he continues to get his pain meds from [place]... he requests Tramadol instead of Diclofenac... we discussed poss pain clinic referral. 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 ~  11/12:  Now taking Mobic, Robaxin, Neurontin, and off the Vicodin & Tramadol... ~  5/13:  He reports same pain med regimen per DrHirsh w/ epid steroid injections...  ~  3/14:  He had f/u DrHirsh> stable on meds & exercise program=> he received another ESI injection 4/14... ~  He has developed left shoulder pain & MRI left shoulder by DrHilts (in Epic) 9/14 showed a full-thickness retracted rotator cuff tear involving the infraspinatus and supraspinatus tendons, plus long head biceps tendinopathy w/o tear, and advanced AC joint arthropathy; we do not have notes from Ortho to review, pt indicates that the surg has not yet been sched...  GOUT (ICD-274.9) - on ALLOPURINOL 300mg /d...  ~  labs 9/09 showed UricAcid level= 4.7 ~  Labs 5/12 showed Uric= 5.1 ~  Labs 5/14 on Allopurinol300showed Uric= 4.3  ANXIETY (ICD-300.00)  BASAL CELL CARCINOMA, HX OF (ICD-V10.83) - s/p removal of 2 basal cell Ca's from neck and left arm by DrTaffeen...  HEALTH MAINTENANCE:   ~  colon- done by [facility] 12/09 & neg- f/u 15yrs. ~  GU- PSA 11/11 was 2.13... ~  Immunization- had PNEUMOVAX 10/10 age 74... had 2010 Flu shot as well... had TDAP 5/11... Rx written for shingles vacc.   Past Surgical History  Procedure Date  . Back injection oct 2011, nov 2011    Outpatient Encounter Prescriptions as of 08/28/2013  Medication Sig  . allopurinol (ZYLOPRIM) 300 MG tablet Take 1 tablet (300 mg total) by mouth daily.  Marland Kitchen aspirin 81 MG tablet Take 81 mg by mouth daily.    Marland Kitchen atenolol (TENORMIN) 50 MG tablet Take 1 tablet (50 mg total) by mouth daily.  . diclofenac sodium (VOLTAREN) 1 % GEL Apply to shoulder as needed  . fluticasone (FLONASE) 50 MCG/ACT nasal spray Place 1 spray into both nostrils daily.  Marland Kitchen gabapentin (NEURONTIN) 300 MG  capsule Take 300 mg by mouth 4 (four) times daily.   . hydrochlorothiazide  (HYDRODIURIL) 25 MG tablet Take 1 tablet by mouth  daily  . loratadine (CLARITIN) 10 MG tablet Take 10 mg by mouth daily. As needed   . meloxicam (MOBIC) 7.5 MG tablet Take 7.5 mg by mouth 2 (two) times daily.    . methocarbamol (ROBAXIN) 500 MG tablet Take 500 mg by mouth 2 (two) times daily.    Marland Kitchen omeprazole (PRILOSEC) 20 MG capsule TAKE 1 CAPSULE BY MOUTH TWICE DAILY 30 MINUTES BEFORE BREAKFAST AND DINNER  . rosuvastatin (CRESTOR) 20 MG tablet Take 1 tablet (20 mg total) by mouth daily.  . sildenafil (VIAGRA) 100 MG tablet Take 1 tablet (100 mg total) by mouth as needed for erectile dysfunction.  . traMADol (ULTRAM) 50 MG tablet TAKE 1 TABLET BY MOUTH THREE TIMES DAILY AS NEEDED FOR PAIN  . valsartan (DIOVAN) 320 MG tablet Take 1 tablet (320 mg total) by mouth daily.  . Olopatadine HCl (PATANASE) 0.6 % SOLN 2 sprays in each nostil two times daily    No Known Allergies   Current Medications, Allergies, Past Medical History, Past Surgical History, Family History, and Social History were reviewed in [employer] record.    Review of Systems         See HPI - all other systems neg except as noted... The patient complains of decreased hearing, dyspnea on exertion, and difficulty walking.  The patient denies anorexia, fever, weight loss, weight gain, vision loss, hoarseness, chest pain, syncope, peripheral edema, prolonged cough, headaches, hemoptysis, abdominal pain, melena, hematochezia, severe indigestion/heartburn, hematuria, incontinence, muscle weakness, suspicious skin lesions, transient blindness, depression, unusual weight change, abnormal bleeding, enlarged lymph nodes, and angioedema.     Objective:   Physical Exam     WD, WN, 72 y/o WM in NAD... GENERAL:  Alert & oriented; pleasant & cooperative... HEENT:  Driscoll/AT, EOM-wnl, PERRLA, EACs-clear, TMs-wnl, NOSE-clear, THROAT-clear & wnl. NECK:  Supple w/ fairROM; no JVD; normal carotid impulses w/o bruits; no  thyromegaly or nodules palpated; no lymphadenopathy. CHEST:  Clear to P & A; without wheezes/ rales/ or rhonchi heard... min tender chest wall... HEART:  Regular Rhythm; faint gr1/6 SEM apex, no rubs or gallops detected... ABDOMEN:  Soft & nontender; normal bowel sounds; no organomegaly or masses palpated. EXT: without deformities, mod arthritic changes; no varicose veins/+ venous insuffic/ no edema. NEURO:  CN's intact; no focal neuro deficits; SLR restricted w/ pain... DERM:  No lesions noted; no rash etc...  RADIOLOGY DATA:  Reviewed in the EPIC EMR & discussed w/ the patient...  LABORATORY DATA:  Reviewed in the EPIC EMR & discussed w/ the patient...   Assessment & Plan:    LEFT SHOULDER PAIN w/ Rotator cuff tear>> he is taking pain meds listed, he tells me he is going to sched the surg; he will need pre-op CXR, EKG, and blood work...   HBP>  Controlled on meds> BBlocker, ARB, Diuretic> continue same + better diet, get wt down.  ASPVD>  Need to address risk factors> contine ASA & BP meds/ Statin, get on diet, get wt down...  CHOL>  FLP looks just as good on the Cres20 & he prefers this med...  GI>  Followed by DrKaplan, stable, continue current meds.  DJD, LBP, DDD>  Followed by Carles Collet w/ freq epid sterioid injections; on Mobic, Methocarbamol, Neurontin...  Gout>  Uric looks good on Allopurinol Rx...  Anxiety>  Aware, not currently on anxiolytic  agent...   Patient's Medications  New Prescriptions   No medications on file  Previous Medications   ALLOPURINOL (ZYLOPRIM) 300 MG TABLET    Take 1 tablet (300 mg total) by mouth daily.   ASPIRIN 81 MG TABLET    Take 81 mg by mouth daily.     DICLOFENAC SODIUM (VOLTAREN) 1 % GEL    Apply to shoulder as needed   FLUTICASONE (FLONASE) 50 MCG/ACT NASAL SPRAY    Place 1 spray into both nostrils daily.   GABAPENTIN (NEURONTIN) 300 MG CAPSULE    Take 300 mg by mouth 4 (four) times daily.    HYDROCHLOROTHIAZIDE (HYDRODIURIL) 25 MG  TABLET    Take 1 tablet by mouth  daily   LORATADINE (CLARITIN) 10 MG TABLET    Take 10 mg by mouth daily. As needed    MELOXICAM (MOBIC) 7.5 MG TABLET    Take 7.5 mg by mouth 2 (two) times daily.     METHOCARBAMOL (ROBAXIN) 500 MG TABLET    Take 500 mg by mouth 2 (two) times daily.     OLOPATADINE HCL (PATANASE) 0.6 % SOLN    2 sprays in each nostil two times daily   OMEPRAZOLE (PRILOSEC) 20 MG CAPSULE    TAKE 1 CAPSULE BY MOUTH TWICE DAILY 30 MINUTES BEFORE BREAKFAST AND DINNER   SILDENAFIL (VIAGRA) 100 MG TABLET    Take 1 tablet (100 mg total) by mouth as needed for erectile dysfunction.   TRAMADOL (ULTRAM) 50 MG TABLET    TAKE 1 TABLET BY MOUTH THREE TIMES DAILY AS NEEDED FOR PAIN  Modified Medications   Modified Medication Previous Medication   ATENOLOL (TENORMIN) 50 MG TABLET atenolol (TENORMIN) 50 MG tablet      Take 1 tablet (50 mg total) by mouth daily.    Take 1 tablet (50 mg total) by mouth daily.   ROSUVASTATIN (CRESTOR) 20 MG TABLET rosuvastatin (CRESTOR) 20 MG tablet      Take 1 tablet (20 mg total) by mouth daily.    Take 1 tablet (20 mg total) by mouth daily.   VALSARTAN (DIOVAN) 320 MG TABLET valsartan (DIOVAN) 320 MG tablet      Take 1 tablet (320 mg total) by mouth daily.    Take 1 tablet (320 mg total) by mouth daily.  Discontinued Medications   No medications on file

## 2013-08-28 NOTE — Patient Instructions (Signed)
Today we updated your med list in our EPIC system...    Continue your current medications the same...  Good luck w/ the up coming shoulder surgery...  Call for any questions...  Let's plan a follow up visit in 74mo with FASTING blood work at that time.Marland KitchenMarland Kitchen

## 2013-09-25 ENCOUNTER — Telehealth: Payer: Self-pay | Admitting: Pulmonary Disease

## 2013-09-25 MED ORDER — VALSARTAN 320 MG PO TABS: 320.0000 mg | ORAL_TABLET | Freq: Every day | ORAL | Status: DC

## 2013-09-25 MED ORDER — ROSUVASTATIN CALCIUM 20 MG PO TABS: 20.0000 mg | ORAL_TABLET | Freq: Every day | ORAL | Status: DC

## 2013-09-25 MED ORDER — ATENOLOL 50 MG PO TABS: 50.0000 mg | ORAL_TABLET | Freq: Every day | ORAL | Status: DC

## 2013-09-25 NOTE — Telephone Encounter (Signed)
Pt advised. Jennifer Castillo, CMA  

## 2013-09-25 NOTE — Telephone Encounter (Signed)
Atenolol, Valsartan and Crestor all called in 90-day supply x 1 refill [facility]. Pt d/t follow up June 2015.  ATC x 2 -- line busy [facility]

## 2013-09-28 HISTORY — PX: ROTATOR CUFF REPAIR: SHX139

## 2013-10-10 ENCOUNTER — Other Ambulatory Visit: Payer: Self-pay | Admitting: Pulmonary Disease

## 2013-10-11 DIAGNOSIS — B351 Tinea unguium: Secondary | ICD-10-CM

## 2013-10-13 ENCOUNTER — Other Ambulatory Visit: Payer: Self-pay | Admitting: Pulmonary Disease

## 2013-11-08 ENCOUNTER — Ambulatory Visit (INDEPENDENT_AMBULATORY_CARE_PROVIDER_SITE_OTHER): Payer: Medicare Other

## 2013-11-08 VITALS — BP 141/91 | HR 63 | Resp 18

## 2013-11-08 DIAGNOSIS — M79609 Pain in unspecified limb: Secondary | ICD-10-CM

## 2013-11-08 DIAGNOSIS — B351 Tinea unguium: Secondary | ICD-10-CM

## 2013-11-08 NOTE — Progress Notes (Signed)
   Subjective:    Patient ID: Brandon Haynes, male    DOB: Mar 11, 1941, 73 y.o.   MRN: [id number]  HPI I am here so he can look at my nails that were fungal and the 4th toe on the left toe is better and I had to use an emery board to file it down    Review of Systems no new changes or findings at this     Objective:   Physical Exam Neurovascular status is intact with pedal pulses palpable DP postal for PT plus one over 4 bilateral Refill time 3 seconds. Nails show some thickening yellowing and brittleness and distal one third portion patient been applying topical formula 3 as instructed there is proximal clearing the nail although still some yellowing and thickening and tenderness on palpation. Patient also using an Warren Lacy board to help keep the nails smooth and file them at home. No open wounds or ulcerations noted no secondary infection is noted orthopedic biomechanical exam unremarkable mild digital contractures rectus foot otherwise noted       Assessment & Plan:  Assessment onychomycosis improving with topical antifungal treatment utilizing formula 3. Continue with topical applications once or twice daily reappointed 3-4 months for continued followup and palliative nail care in the future as needed  Harriet Masson DPM

## 2013-11-08 NOTE — Patient Instructions (Signed)

## 2013-11-20 ENCOUNTER — Telehealth: Payer: Self-pay | Admitting: Pulmonary Disease

## 2013-11-20 MED ORDER — HYDROCHLOROTHIAZIDE 25 MG PO TABS: ORAL_TABLET | ORAL | Status: DC

## 2013-11-20 NOTE — Telephone Encounter (Signed)
I called and spoke w/ pt. RX sent for hctz. Nothing further needed

## 2013-12-07 ENCOUNTER — Other Ambulatory Visit: Payer: Self-pay | Admitting: Pulmonary Disease

## 2013-12-14 NOTE — Telephone Encounter (Signed)
Called spoke with pt. Aware SN is retiring from [facility] as of 12/27/13. He has been giving # to the [facility] and will be calling for appt this AM. He is aware will refill his medication.

## 2013-12-20 ENCOUNTER — Telehealth: Payer: Self-pay | Admitting: Pulmonary Disease

## 2013-12-20 MED ORDER — ALLOPURINOL 300 MG PO TABS: 300.0000 mg | ORAL_TABLET | Freq: Every day | ORAL | Status: DC

## 2013-12-20 NOTE — Telephone Encounter (Signed)
Called and spoke with pt and he is requesting a refill of the allopurinol sent to [facility].  This has been done and nothing further is needed.

## 2014-02-06 ENCOUNTER — Ambulatory Visit (INDEPENDENT_AMBULATORY_CARE_PROVIDER_SITE_OTHER): Payer: Medicare Other

## 2014-02-06 VITALS — BP 139/81 | HR 70 | Resp 15 | Ht 74.0 in | Wt 210.0 lb

## 2014-02-06 DIAGNOSIS — B351 Tinea unguium: Secondary | ICD-10-CM

## 2014-02-06 DIAGNOSIS — M79609 Pain in unspecified limb: Secondary | ICD-10-CM

## 2014-02-06 DIAGNOSIS — Q828 Other specified congenital malformations of skin: Secondary | ICD-10-CM

## 2014-02-06 NOTE — Patient Instructions (Signed)

## 2014-02-06 NOTE — Progress Notes (Signed)
   Subjective:    Patient ID: Brandon Haynes, male    DOB: [birth year], 73 y.o.   MRN: [id number]  HPI Comments: Pt states he is pleased with the improvement of the toenails while using Formula 3 twice daily.  Debridement 1 - 10 toenails and checking left 4th distal toe.     Review of Systems no new systemic changes or findings noted     Objective:   Physical Exam Lower extremity objective findings as follows vascular status DP +2/4 PT plus one over 4 bilateral Refill time 3 seconds nails thick and brittle yellow criptotic and discolored there is also slight distal clavus or poor keratotic lesion or verruca lesion plantar lateral fourth toe left foot which is painful and symptomatic at this time appear to be more verrucoid for support keratoses there is no open wounds ulcerations no secondary infection is noted decreased sensation confirmed on Semmes Weinstein testing to forefoot digits is normal plantar response and DTRs noted.       Assessment & Plan:  Assessment this time is onychomycosis for mycotic nails 1 through 5 bilateral are debrided patient using formula 3 there is continued consistent improvements in the discoloration and friability the nails maintain topical formula 3 application nails debrided 1 through 5 bilateral also single poor keratotic lesion fourth left is debrided Neosporin and Band-Aid applied recheck in 3 months or as needed for future followup  Harriet Masson DPM

## 2014-02-26 ENCOUNTER — Ambulatory Visit (INDEPENDENT_AMBULATORY_CARE_PROVIDER_SITE_OTHER): Payer: Medicare Other | Admitting: Family Medicine

## 2014-02-26 ENCOUNTER — Encounter: Payer: Self-pay | Admitting: Family Medicine

## 2014-02-26 ENCOUNTER — Ambulatory Visit: Payer: Medicare Other | Admitting: Pulmonary Disease

## 2014-02-26 VITALS — BP 117/77 | HR 63 | Temp 98.1°F | Ht 74.0 in | Wt 210.0 lb

## 2014-02-26 DIAGNOSIS — Z7189 Other specified counseling: Secondary | ICD-10-CM

## 2014-02-26 DIAGNOSIS — I10 Essential (primary) hypertension: Secondary | ICD-10-CM

## 2014-02-26 DIAGNOSIS — Z7689 Persons encountering health services in other specified circumstances: Secondary | ICD-10-CM | POA: Insufficient documentation

## 2014-02-26 MED ORDER — ATENOLOL 50 MG PO TABS: ORAL_TABLET | ORAL | Status: DC

## 2014-02-26 NOTE — Assessment & Plan Note (Signed)
Possibly starting to have some autonomic dysfunction--some low afternoon bp's. Encouraged adequate fluid and food intake.  Will cut back to 1/2 of 50mg  atenolol per day, continue other bp meds at current dosing for now.  Continue to monitor bps. No labs or new meds today.

## 2014-02-26 NOTE — Assessment & Plan Note (Signed)
Collected/reviewed history.

## 2014-02-26 NOTE — Progress Notes (Signed)
Office Note 02/26/2014  CC: No chief complaint on file.   HPI:  Brandon Haynes is a 73 y.o. White male who is here to establish care, transfer from Dr. Milderd Meager. Old records in EPIC/HL reviewed prior to or during today's visit.  Reviewed hx today.  Lately having low afternoon bp's: down to 33H systolic, he doesn't know the HR during these times. Takes atenolol and hctz in morning, takes diovan about 5 pm. He has been gradually purposefully losing wt over the last few years.  Some days skips a meal, sounds like he doesn't drink fluids the best.  Past Medical History  Diagnosis Date  . Chest pain 2010    stress testing neg; GERD likely  . Hypertension   . Hypercholesteremia   . Hearing loss   . Allergic rhinitis     allergy shots via [place]  . Peripheral vascular disease   . GERD (gastroesophageal reflux disease)   . Hx of colonic polyps   . DDD (degenerative disc disease), lumbar   . Gout   . Chronic low back pain   . Anxiety   . History  of basal cell carcinoma   . Pleural thickening     chronic    Past Surgical History  Procedure Laterality Date  . Back injection  oct 2011, nov 2011    no surgery  . Shoulder injection  feb 2014  . Back injection  01/11/2013  . Rotator cuff repair  09/2013    left  . Cardiovascular stress test  05/2009    negative/normal  . Colonoscopy  08/2008    repeat 7 yrs    No family history on file.  History   Social History  . Marital Status: Married    Spouse Name: Vaughan Basta    Number of Children: 3  . Years of Education: N/A   Occupational History  . retired    Social History Main Topics  . Smoking status: Former Smoker -- 2.00 packs/day for 35 years    Types: Cigarettes    Quit date: 09/28/1998  . Smokeless tobacco: Former Systems developer     Comment: as a teenager  . Alcohol Use: No     Comment: quit 1994  . Drug Use: No  . Sexual Activity: Not on file   Other Topics Concern  . Not on file   Social History Narrative    Married, 3 children.   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   Orig from [place], lived on same farm all his life.   Former smoker: quit approx around [place].  80 pack-yr hx.   Alcohol-none, used to drink a lot of beer on weekends.  No drugs.   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   Diet: fair    Outpatient Encounter Prescriptions as of 02/26/2014  Medication Sig  . acetaminophen-codeine (TYLENOL #3) 300-30 MG per tablet   . allopurinol (ZYLOPRIM) 300 MG tablet Take 1 tablet (300 mg total) by mouth daily.  Marland Kitchen aspirin 81 MG tablet Take 81 mg by mouth daily.    Marland Kitchen atenolol (TENORMIN) 50 MG tablet 1/2 tab po qd  . fluticasone (FLONASE) 50 MCG/ACT nasal spray Place 1 spray into both nostrils daily.  Marland Kitchen gabapentin (NEURONTIN) 300 MG capsule Take 300 mg by mouth 4 (four) times daily.   . hydrochlorothiazide (HYDRODIURIL) 25 MG tablet Take 1 tablet by mouth  daily  . loratadine (CLARITIN) 10 MG tablet Take 10 mg by mouth daily. As  needed   . omeprazole (PRILOSEC) 20 MG capsule TAKE 1 CAPSULE BY MOUTH TWICE DAILY 30 MINUTES BEFORE BREAKFAST AND DINNER  . rosuvastatin (CRESTOR) 20 MG tablet Take 1 tablet (20 mg total) by mouth daily.  . traMADol (ULTRAM) 50 MG tablet TAKE 1 TABLET BY MOUTH THREE TIMES DAILY AS NEEDED FOR FOR PAIN  . valsartan (DIOVAN) 320 MG tablet Take 1 tablet (320 mg total) by mouth daily.  . [DISCONTINUED] atenolol (TENORMIN) 50 MG tablet Take 1 tablet (50 mg total) by mouth daily.  . diclofenac sodium (VOLTAREN) 1 % GEL Apply to shoulder as needed  . sildenafil (VIAGRA) 100 MG tablet Take 1 tablet (100 mg total) by mouth as needed for erectile dysfunction.  . [DISCONTINUED] meloxicam (MOBIC) 7.5 MG tablet Take 7.5 mg by mouth 2 (two) times daily.    . [DISCONTINUED] methocarbamol (ROBAXIN) 500 MG tablet Take 500 mg by mouth 2 (two) times daily.    . [DISCONTINUED] Olopatadine HCl (PATANASE) 0.6 % SOLN 2 sprays in each nostil two times daily     No Known Allergies  ROS Review of Systems  Constitutional: Negative for fever and fatigue.  HENT: Negative for congestion and sore throat.   Eyes: Negative for visual disturbance.  Respiratory: Negative for cough.   Cardiovascular: Negative for chest pain.  Gastrointestinal: Negative for nausea and abdominal pain.  Genitourinary: Negative for dysuria.  Musculoskeletal: Negative for back pain and joint swelling.  Skin: Negative for rash.  Neurological: Negative for weakness and headaches.  Hematological: Negative for adenopathy.    PE; Blood pressure 117/77, pulse 63, temperature 98.1 F (36.7 C), temperature source Temporal, height 6\' 2"  (1.88 m), weight 210 lb (95.255 kg), SpO2 98.00%. Gen: Alert, well appearing.  Patient is oriented to person, place, time, and situation. AFFECT: pleasant, lucid thought and speech. Neck: supple/nontender.  No LAD, mass, or TM.  Carotid pulses 2+ bilaterally, without bruits. CV: RRR, no m/r/g.   LUNGS: CTA bilat, nonlabored resps, good aeration in all lung fields. ABD: nondistended, soft EXT: no clubbing, cyanosis, or edema.   Pertinent labs:  None today Reviewed results of past labs from 02/21/13.  ASSESSMENT AND PLAN:   Transfer pt from Dr. Lenna Gilford.  HYPERTENSION Possibly starting to have some autonomic dysfunction--some low afternoon bp's. Encouraged adequate fluid and food intake.  Will cut back to 1/2 of 50mg  atenolol per day, continue other bp meds at current dosing for now.  Continue to monitor bps. No labs or new meds today.  Encounter to establish care with new doctor Collected/reviewed history.  Changed med list to reflect what he is currently taking: no NSAIDs, no robaxin,no eye drops. An After Visit Summary was printed and given to the patient.  Spent 25 min with pt today, with >50% of this time spent in counseling and care coordination regarding the above problems.  Return for annual CPE (fasting)--at patient's  convenience sometime in next few months.

## 2014-03-22 ENCOUNTER — Encounter: Payer: Self-pay | Admitting: Family Medicine

## 2014-03-22 ENCOUNTER — Ambulatory Visit (INDEPENDENT_AMBULATORY_CARE_PROVIDER_SITE_OTHER): Payer: Medicare Other | Admitting: Family Medicine

## 2014-03-22 VITALS — BP 111/74 | HR 66 | Temp 98.2°F | Resp 18 | Ht 74.0 in | Wt 211.0 lb

## 2014-03-22 DIAGNOSIS — Z7189 Other specified counseling: Secondary | ICD-10-CM | POA: Insufficient documentation

## 2014-03-22 DIAGNOSIS — Z23 Encounter for immunization: Secondary | ICD-10-CM

## 2014-03-22 DIAGNOSIS — I951 Orthostatic hypotension: Secondary | ICD-10-CM

## 2014-03-22 DIAGNOSIS — Z125 Encounter for screening for malignant neoplasm of prostate: Secondary | ICD-10-CM

## 2014-03-22 DIAGNOSIS — I959 Hypotension, unspecified: Secondary | ICD-10-CM | POA: Insufficient documentation

## 2014-03-22 DIAGNOSIS — I9589 Other hypotension: Secondary | ICD-10-CM

## 2014-03-22 DIAGNOSIS — I10 Essential (primary) hypertension: Secondary | ICD-10-CM

## 2014-03-22 DIAGNOSIS — Z0389 Encounter for observation for other suspected diseases and conditions ruled out: Secondary | ICD-10-CM

## 2014-03-22 DIAGNOSIS — Z0000 Encounter for general adult medical examination without abnormal findings: Secondary | ICD-10-CM

## 2014-03-22 LAB — CBC WITH DIFFERENTIAL/PLATELET
Basophils Absolute: 0 10*3/uL (ref 0.0–0.1)
Basophils Relative: 0.6 % (ref 0.0–3.0)
EOS ABS: 0.2 10*3/uL (ref 0.0–0.7)
Eosinophils Relative: 3.3 % (ref 0.0–5.0)
HCT: 42 % (ref 39.0–52.0)
Hemoglobin: 14.1 g/dL (ref 13.0–17.0)
Lymphocytes Relative: 29.2 % (ref 12.0–46.0)
Lymphs Abs: 2.1 10*3/uL (ref 0.7–4.0)
MCHC: 33.4 g/dL (ref 30.0–36.0)
MCV: 92.3 fl (ref 78.0–100.0)
MONO ABS: 0.4 10*3/uL (ref 0.1–1.0)
Monocytes Relative: 6.1 % (ref 3.0–12.0)
NEUTROS PCT: 60.8 % (ref 43.0–77.0)
Neutro Abs: 4.4 10*3/uL (ref 1.4–7.7)
Platelets: 171 10*3/uL (ref 150.0–400.0)
RBC: 4.55 Mil/uL (ref 4.22–5.81)
RDW: 15.3 % (ref 11.5–15.5)
WBC: 7.3 10*3/uL (ref 4.0–10.5)

## 2014-03-22 LAB — COMPREHENSIVE METABOLIC PANEL
ALK PHOS: 45 U/L (ref 39–117)
ALT: 24 U/L (ref 0–53)
AST: 51 U/L — AB (ref 0–37)
Albumin: 4.7 g/dL (ref 3.5–5.2)
BUN: 12 mg/dL (ref 6–23)
CO2: 29 mEq/L (ref 19–32)
Calcium: 9.6 mg/dL (ref 8.4–10.5)
Chloride: 103 mEq/L (ref 96–112)
Creatinine, Ser: 0.9 mg/dL (ref 0.4–1.5)
GFR: 84.04 mL/min (ref >60.00)
Glucose, Bld: 94 mg/dL (ref 70–99)
POTASSIUM: 3.6 meq/L (ref 3.5–5.1)
Sodium: 140 mEq/L (ref 135–145)
Total Bilirubin: 0.9 mg/dL (ref 0.2–1.2)
Total Protein: 7.2 g/dL (ref 6.0–8.3)

## 2014-03-22 LAB — LIPID PANEL
CHOLESTEROL: 155 mg/dL (ref 0–200)
HDL: 57.8 mg/dL (ref >39.00)
LDL Cholesterol: 77 mg/dL (ref 0–99)
NonHDL: 97.2
TRIGLYCERIDES: 103 mg/dL (ref 0.0–149.0)
Total CHOL/HDL Ratio: 3
VLDL: 20.6 mg/dL (ref 0.0–40.0)

## 2014-03-22 LAB — PSA: PSA: 2.13 ng/mL (ref 0.10–4.00)

## 2014-03-22 LAB — TSH: TSH: 1.29 u[IU]/mL (ref 0.35–4.50)

## 2014-03-22 NOTE — Assessment & Plan Note (Signed)
Due to bp's being well into normal range + some occ symptomatic low bps and consistent HR in 60s--will d/c atenolol completely.  Encouraged close attention to good oral hydration habits. Recheck in office in 1 mo to review bps. Cr/lytes today.

## 2014-03-22 NOTE — Assessment & Plan Note (Signed)
Reviewed age and gender appropriate health maintenance issues (prudent diet, regular exercise, health risks of tobacco and excessive alcohol, use of seatbelts, fire alarms in home, use of sunscreen).  Also reviewed age and gender appropriate health screening as well as vaccine recommendations. HP labs + PSA today. DRE normal. Next colonoscopy due in 1 yr. Prevnar 13 IM today.

## 2014-03-22 NOTE — Progress Notes (Signed)
Pre visit review using our clinic review tool, if applicable. No additional management support is needed unless otherwise documented below in the visit note. 

## 2014-03-22 NOTE — Progress Notes (Signed)
Office Note 03/22/2014  CC:  Chief Complaint  Patient presents with  . Annual Exam    fasting   HPI:  Brandon Haynes is a 73 y.o. White male who is here for fasting CPE.   Since cutting atenolol in half last visit he still has low normal bp's and occ symptomatic low bp, HR consistently in 60s. He admits he works in the heat a lot and doesn't hydrate well.  He is overdue for routine eye exam.  [facility]es [facility] [place]. Gets dental visits regularly.  Past Medical History  Diagnosis Date  . Chest pain 2010    stress testing neg; GERD likely  . Hypertension   . Hypercholesteremia   . Hearing loss   . Allergic rhinitis     allergy shots via [place]  . Peripheral vascular disease   . GERD (gastroesophageal reflux disease)   . Hx of colonic polyps   . DDD (degenerative disc disease), lumbar   . Gout   . Chronic low back pain   . Anxiety   . History  of basal cell carcinoma   . Pleural thickening     chronic    Past Surgical History  Procedure Laterality Date  . Back injection  oct 2011, nov 2011    no surgery  . Shoulder injection  feb 2014  . Back injection  01/11/2013  . Rotator cuff repair  09/2013    left  . Cardiovascular stress test  05/2009    negative/normal  . Colonoscopy  08/2008    repeat 7 yrs    No family history on file.  History   Social History  . Marital Status: Married    Spouse Name: Vaughan Basta    Number of Children: 3  . Years of Education: N/A   Occupational History  . retired    Social History Main Topics  . Smoking status: Former Smoker -- 2.00 packs/day for 35 years    Types: Cigarettes    Quit date: 09/28/1998  . Smokeless tobacco: Former Systems developer     Comment: as a teenager  . Alcohol Use: No     Comment: quit 1994  . Drug Use: No  . Sexual Activity: Not on file   Other Topics Concern  . Not on file   Social History Narrative   Married, 3 children.   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   Orig  from [place], lived on same farm all his life.   Former smoker: quit approx around [place].  80 pack-yr hx.   Alcohol-none, used to drink a lot of beer on weekends.  No drugs.   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   Diet: fair    Outpatient Prescriptions Prior to Visit  Medication Sig Dispense Refill  . allopurinol (ZYLOPRIM) 300 MG tablet Take 1 tablet (300 mg total) by mouth daily.  90 tablet  1  . aspirin 81 MG tablet Take 81 mg by mouth daily.        Marland Kitchen atenolol (TENORMIN) 50 MG tablet 1/2 tab po qd  90 tablet  1  . fluticasone (FLONASE) 50 MCG/ACT nasal spray Place 1 spray into both nostrils daily.      Marland Kitchen gabapentin (NEURONTIN) 300 MG capsule Take 300 mg by mouth 4 (four) times daily.       . hydrochlorothiazide (HYDRODIURIL) 25 MG tablet Take 1 tablet by mouth  daily  90 tablet  3  . loratadine (CLARITIN) 10  MG tablet Take 10 mg by mouth daily. As needed       . omeprazole (PRILOSEC) 20 MG capsule TAKE 1 CAPSULE BY MOUTH TWICE DAILY 30 MINUTES BEFORE BREAKFAST AND DINNER  180 capsule  2  . rosuvastatin (CRESTOR) 20 MG tablet Take 1 tablet (20 mg total) by mouth daily.  90 tablet  1  . traMADol (ULTRAM) 50 MG tablet TAKE 1 TABLET BY MOUTH THREE TIMES DAILY AS NEEDED FOR FOR PAIN  90 tablet  3  . valsartan (DIOVAN) 320 MG tablet Take 1 tablet (320 mg total) by mouth daily.  90 tablet  1  . sildenafil (VIAGRA) 100 MG tablet Take 1 tablet (100 mg total) by mouth as needed for erectile dysfunction.  10 tablet  0  . acetaminophen-codeine (TYLENOL #3) 300-30 MG per tablet       . diclofenac sodium (VOLTAREN) 1 % GEL Apply to shoulder as needed       No facility-administered medications prior to visit.    No Known Allergies  ROS Review of Systems  Constitutional: Negative for fever, chills, appetite change and fatigue.  HENT: Negative for congestion, dental problem, ear pain and sore throat.   Eyes: Negative for discharge, redness and visual disturbance.  Respiratory:  Negative for cough, chest tightness, shortness of breath and wheezing.   Cardiovascular: Negative for chest pain, palpitations and leg swelling.  Gastrointestinal: Negative for nausea, vomiting, abdominal pain, diarrhea and blood in stool.  Genitourinary: Negative for dysuria, urgency, frequency, hematuria, flank pain and difficulty urinating.  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 Negative for arthralgias, joint swelling, myalgias and neck stiffness.  Skin: Negative for pallor and rash.  Neurological: Negative for dizziness, speech difficulty, weakness and headaches.  Hematological: Negative for adenopathy. Does not bruise/bleed easily.  Psychiatric/Behavioral: Negative for confusion and sleep disturbance. The patient is not nervous/anxious.     PE; Blood pressure 111/74, pulse 66, temperature 98.2 F (36.8 C), temperature source Temporal, resp. rate 18, height 6\' 2"  (1.88 m), weight 211 lb (95.709 kg), SpO2 95.00%. Gen: Alert, well appearing.  Patient is oriented to person, place, time, and situation. AFFECT: pleasant, lucid thought and speech. ENT: Ears: EACs clear, normal epithelium.  TMs with good light reflex and landmarks bilaterally.  Eyes: no injection, icteris, swelling, or exudate.  EOMI, PERRLA. Nose: no drainage or turbinate edema/swelling.  No injection or focal lesion.  Mouth: lips without lesion/swelling.  Oral mucosa pink and moist.  Dentition intact and without obvious caries or gingival swelling.  Oropharynx without erythema, exudate, or swelling.  Neck: supple/nontender.  No LAD, mass, or TM.  Carotid pulses 1+ bilaterally, without bruits. CV: RRR, no m/r/g.   LUNGS: CTA bilat, nonlabored resps, good aeration in all lung fields. ABD: soft, NT, ND, BS normal.  No hepatospenomegaly or mass.  No bruits. EXT: no clubbing, cyanosis, or edema.  Musculoskeletal: no joint swelling, erythema, warmth, or tenderness.  ROM of all joints  intact. Skin - no sores or suspicious lesions or rashes or color changes Rectal exam: negative without mass, lesions or tenderness, PROSTATE EXAM: smooth and symmetric without nodules or tenderness.  Pertinent labs:  None today  ASSESSMENT AND PLAN:   HYPERTENSION Due to bp's being well into normal range + some occ symptomatic low bps and consistent HR in 60s--will d/c atenolol completely.  Encouraged close attention to good oral hydration habits. Recheck in office in 1 mo to review bps. Cr/lytes today.  Health maintenance examination  Reviewed age and gender appropriate health maintenance issues (prudent diet, regular exercise, health risks of tobacco and excessive alcohol, use of seatbelts, fire alarms in home, use of sunscreen).  Also reviewed age and gender appropriate health screening as well as vaccine recommendations. HP labs + PSA today. DRE normal. Next colonoscopy due in 1 yr. Prevnar 13 IM today.  An After Visit Summary was printed and given to the patient.  FOLLOW UP:  Return in about 4 weeks (around 04/19/2014) for f/u HTN/symptomatic hypotension.

## 2014-03-23 ENCOUNTER — Telehealth: Payer: Self-pay | Admitting: Family Medicine

## 2014-03-23 NOTE — Telephone Encounter (Signed)
Relevant patient education assigned to patient using Emmi. ° °

## 2014-04-05 ENCOUNTER — Telehealth: Payer: Self-pay | Admitting: Family Medicine

## 2014-04-05 MED ORDER — ALLOPURINOL 300 MG PO TABS: 300.0000 mg | ORAL_TABLET | Freq: Every day | ORAL | Status: DC

## 2014-04-05 MED ORDER — ROSUVASTATIN CALCIUM 20 MG PO TABS: 20.0000 mg | ORAL_TABLET | Freq: Every day | ORAL | Status: DC

## 2014-04-05 MED ORDER — VALSARTAN 320 MG PO TABS: 320.0000 mg | ORAL_TABLET | Freq: Every day | ORAL | Status: DC

## 2014-04-05 MED ORDER — HYDROCHLOROTHIAZIDE 25 MG PO TABS
ORAL_TABLET | ORAL | Status: DC
Start: 2014-04-05 — End: 2014-07-02

## 2014-04-05 NOTE — Telephone Encounter (Signed)
Patient is out of Valsartan please send in 10 day supply to [facility]. Please send 90 day supply to [facility] for Valsartan, Crestor, Allopurinol, Hydrochlorothiazide.

## 2014-04-09 ENCOUNTER — Other Ambulatory Visit: Payer: Self-pay | Admitting: Pulmonary Disease

## 2014-04-19 ENCOUNTER — Encounter: Payer: Self-pay | Admitting: Family Medicine

## 2014-04-19 ENCOUNTER — Ambulatory Visit (INDEPENDENT_AMBULATORY_CARE_PROVIDER_SITE_OTHER): Payer: Medicare Other | Admitting: Family Medicine

## 2014-04-19 VITALS — BP 118/77 | HR 60 | Temp 98.2°F | Resp 18 | Ht 74.0 in | Wt 215.0 lb

## 2014-04-19 DIAGNOSIS — I9589 Other hypotension: Secondary | ICD-10-CM

## 2014-04-19 DIAGNOSIS — I10 Essential (primary) hypertension: Secondary | ICD-10-CM

## 2014-04-19 DIAGNOSIS — I951 Orthostatic hypotension: Secondary | ICD-10-CM

## 2014-04-19 DIAGNOSIS — I959 Hypotension, unspecified: Secondary | ICD-10-CM

## 2014-04-19 MED ORDER — TRAMADOL HCL 50 MG PO TABS: ORAL_TABLET | ORAL | Status: DC

## 2014-04-19 NOTE — Progress Notes (Signed)
Pre visit review using our clinic review tool, if applicable. No additional management support is needed unless otherwise documented below in the visit note. 

## 2014-04-19 NOTE — Progress Notes (Signed)
OFFICE NOTE  04/19/2014  CC:  Chief Complaint  Patient presents with  . Follow-up  . Medication Refill    tramadol      HPI: Patient is a 73 y.o. Caucasian male who is here for 1 mo f/u HTN, was having some episodes of symptomatic hypotension so I d/c'd his atenolol b/c cutting it in half had not made much difference.  HP labs at that time were all normal. In the interim, he says his bp stayed ok but HR was going up to just over 100 and he felt a bit of chest discomfort and "smothered-feeling", nauseated and a bit dizzy, so he restarted 50mg  tab atenolol and felt better.  He then switched to 1/2 tab last night.  He feels well on this 1/2 tab dosing.    Pertinent PMH:  Past surgical, social, and family history reviewed and no changes noted since last office visit.  MEDS:  Outpatient Prescriptions Prior to Visit  Medication Sig Dispense Refill  . allopurinol (ZYLOPRIM) 300 MG tablet Take 1 tablet (300 mg total) by mouth daily.  90 tablet  1  . aspirin 81 MG tablet Take 81 mg by mouth daily.        Marland Kitchen atenolol (TENORMIN) 50 MG tablet 1/2 tab po qd  90 tablet  1  . fluticasone (FLONASE) 50 MCG/ACT nasal spray Place 1 spray into both nostrils daily.      Marland Kitchen gabapentin (NEURONTIN) 300 MG capsule Take 300 mg by mouth 4 (four) times daily.       . hydrochlorothiazide (HYDRODIURIL) 25 MG tablet Take 1 tablet by mouth  daily  90 tablet  1  . loratadine (CLARITIN) 10 MG tablet Take 10 mg by mouth daily. As needed       . Olopatadine HCl 0.6 % SOLN Place into the nose 2 (two) times daily.      Marland Kitchen omeprazole (PRILOSEC) 20 MG capsule TAKE 1 CAPSULE BY MOUTH TWICE DAILY 30 MINUTES BEFORE BREAKFAST AND DINNER  180 capsule  2  . rosuvastatin (CRESTOR) 20 MG tablet Take 1 tablet (20 mg total) by mouth daily.  90 tablet  1  . traMADol (ULTRAM) 50 MG tablet TAKE 1 TABLET BY MOUTH THREE TIMES DAILY AS NEEDED FOR FOR PAIN  90 tablet  3  . valsartan (DIOVAN) 320 MG tablet Take 1 tablet (320 mg total) by mouth  daily.  90 tablet  1  . sildenafil (VIAGRA) 100 MG tablet Take 1 tablet (100 mg total) by mouth as needed for erectile dysfunction.  10 tablet  0   No facility-administered medications prior to visit.    PE: Blood pressure 118/77, pulse 60, temperature 98.2 F (36.8 C), temperature source Temporal, resp. rate 18, height 6\' 2"  (1.88 m), weight 215 lb (97.523 kg), SpO2 96.00%. Gen: Alert, well appearing.  Patient is oriented to person, place, time, and situation. CV: RRR, no m/r/g (rate 60).   Chest is clear, no wheezing or rales. Normal symmetric air entry throughout both lung fields. No chest wall deformities or tenderness.   IMPRESSION AND PLAN:  1) HTN: doing fine on 1/2 atenolol 50 mg qd now. He'll call or return if any further issues.  RF'd tramadol today: #90, RF x 5.  An After Visit Summary was printed and given to the patient.  FOLLOW UP: 6 mo

## 2014-04-30 ENCOUNTER — Other Ambulatory Visit: Payer: Self-pay | Admitting: Family Medicine

## 2014-04-30 MED ORDER — ATENOLOL 50 MG PO TABS: ORAL_TABLET | ORAL | Status: DC

## 2014-05-02 ENCOUNTER — Other Ambulatory Visit: Payer: Self-pay | Admitting: Family Medicine

## 2014-05-02 MED ORDER — ATENOLOL 50 MG PO TABS: ORAL_TABLET | ORAL | Status: DC

## 2014-05-15 ENCOUNTER — Ambulatory Visit (INDEPENDENT_AMBULATORY_CARE_PROVIDER_SITE_OTHER): Payer: Medicare Other

## 2014-05-15 DIAGNOSIS — M79609 Pain in unspecified limb: Secondary | ICD-10-CM

## 2014-05-15 DIAGNOSIS — M79676 Pain in unspecified toe(s): Secondary | ICD-10-CM

## 2014-05-15 DIAGNOSIS — B351 Tinea unguium: Secondary | ICD-10-CM

## 2014-05-15 NOTE — Patient Instructions (Signed)
Onychomycosis/Fungal Toenails  WHAT IS IT? An infection that lies within the keratin of your nail plate that is caused by a fungus.  WHY ME? Fungal infections affect all ages, sexes, races, and creeds.  There may be many factors that predispose you to a fungal infection such as age, coexisting medical conditions such as diabetes, or an autoimmune disease; stress, medications, fatigue, genetics, etc.  Bottom line: fungus thrives in a warm, moist environment and your shoes offer such a location.  IS IT CONTAGIOUS? Theoretically, yes.  You do not want to share shoes, nail clippers or files with someone who has fungal toenails.  Walking around barefoot in the same room or sleeping in the same bed is unlikely to transfer the organism.  It is important to realize, however, that fungus can spread easily from one nail to the next on the same foot.  HOW DO WE TREAT THIS?  There are several ways to treat this condition.  Treatment may depend on many factors such as age, medications, pregnancy, liver and kidney conditions, etc.  It is best to ask your doctor which options are available to you.  1. No treatment.   Unlike many other medical concerns, you can live with this condition.  However for many people this can be a painful condition and may lead to ingrown toenails or a bacterial infection.  It is recommended that you keep the nails cut short to help reduce the amount of fungal nail. 2. Topical treatment.  These range from herbal remedies to prescription strength nail lacquers.  About 40-50% effective, topicals require twice daily application for approximately 9 to 12 months or until an entirely new nail has grown out.  The most effective topicals are medical grade medications available through physicians offices. 3. Oral antifungal medications.  With an 80-90% cure rate, the most common oral medication requires 3 to 4 months of therapy and stays in your system for a year as the new nail grows out.  Oral  antifungal medications do require blood work to make sure it is a safe drug for you.  A liver function panel will be performed prior to starting the medication and after the first month of treatment.  It is important to have the blood work performed to avoid any harmful side effects.  In general, this medication safe but blood work is required. 4. Laser Therapy.  This treatment is performed by applying a specialized laser to the affected nail plate.  This therapy is noninvasive, fast, and non-painful.  It is not covered by insurance and is therefore, out of pocket.  The results have been very good with a 80-95% cure rate.  [facility] is the only practice in the area to offer this therapy. 5. Permanent Nail Avulsion.  Removing the entire nail so that a new nail will not grow back.  Continue with application of topical formula 3 antifungal as instructed

## 2014-05-15 NOTE — Progress Notes (Signed)
   Subjective:    Patient ID: Brandon Haynes, male    DOB: December 25, 1940, 73 y.o.   MRN: [id number]  HPI Comments: Pt request check of his feet and trimming the toenails 10.  He states the left 4th toe Dr. Blenda Mounts trimmed last time is not bothering him.     Review of Systems no new findings or systemic changes noted     Objective:   Physical Exam Lower extremity objective findings reveal pedal pulses palpable DP +2/4 bilateral PT one over 4 bilateral catheter refill time 3 seconds. Nails yellow brittle thickened although improving with topical formula 3 application there is some still keratoses over [facility] collision plantar lateral fourth digit patient has no open wounds ulcerations no secondary infections multiple thick dystrophic friable criptotic nails again no secondary infections biomechanically rectus foot type rectus digits no fractures or osseous abnormalities no       Assessment & Plan:  Assessment onychomycosis mycotic nails improving continue with daily formula 3 application eventually switched often maintenance levels once a month and tender to touch or palpation with enclosed shoe wear debrided 1 through 5 bilateral this time continue with topical treatment and continued improvement of nail fungus 3 months to 4 month followup for nail check recommend

## 2014-05-25 ENCOUNTER — Other Ambulatory Visit: Payer: Self-pay | Admitting: Family Medicine

## 2014-05-25 MED ORDER — ATENOLOL 50 MG PO TABS: ORAL_TABLET | ORAL | Status: DC

## 2014-06-05 ENCOUNTER — Other Ambulatory Visit: Payer: Self-pay | Admitting: Family Medicine

## 2014-06-05 MED ORDER — ATENOLOL 50 MG PO TABS: ORAL_TABLET | ORAL | Status: DC

## 2014-07-02 ENCOUNTER — Other Ambulatory Visit: Payer: Self-pay | Admitting: Family Medicine

## 2014-07-19 ENCOUNTER — Other Ambulatory Visit: Payer: Self-pay | Admitting: Pulmonary Disease

## 2014-08-08 ENCOUNTER — Other Ambulatory Visit: Payer: Self-pay

## 2014-08-08 ENCOUNTER — Other Ambulatory Visit: Payer: Self-pay | Admitting: Internal Medicine

## 2014-08-08 NOTE — Telephone Encounter (Signed)
Pt called to refill Omeprazole but we have never prescribed this medication before. Can we refill? Dr Lenna Gilford patient.

## 2014-08-09 MED ORDER — OMEPRAZOLE 20 MG PO CPDR: DELAYED_RELEASE_CAPSULE | ORAL | Status: DC

## 2014-09-11 ENCOUNTER — Ambulatory Visit: Payer: Medicare Other

## 2014-09-18 ENCOUNTER — Ambulatory Visit (INDEPENDENT_AMBULATORY_CARE_PROVIDER_SITE_OTHER): Payer: Medicare Other

## 2014-09-18 DIAGNOSIS — B351 Tinea unguium: Secondary | ICD-10-CM

## 2014-09-18 DIAGNOSIS — M79673 Pain in unspecified foot: Secondary | ICD-10-CM

## 2014-09-18 NOTE — Progress Notes (Signed)
   Subjective:    Patient ID: Brandon Haynes, male    DOB: September 03, 1941, 73 y.o.   MRN: [id number]  HPI Pt presents for nail debridement  Review of Systems No new findings or systemic changes noted    Objective:   Physical Exam  Vascular status is intact DP +2 PT plus one over 4 Refill timed 3-4 seconds. Thick brittle criptotic nails 123 and 4 bilateral debrided they're painful symptomatically both with enclosed shoe wear and ambulation. No open wounds no ulcers no secondary infection is noted mild digital contractures are identified.      Assessment & Plan:  Assessment onychomycosis criptotic incurvated nails 1 through 4 bilateral debrided at this time no open wounds no ulcers no secondary infections return in 3 months for follow-up and continued palliative nail care is needed  Harriet Masson DPM

## 2014-10-18 ENCOUNTER — Encounter: Payer: Self-pay | Admitting: Family Medicine

## 2014-10-18 ENCOUNTER — Ambulatory Visit (INDEPENDENT_AMBULATORY_CARE_PROVIDER_SITE_OTHER): Payer: Medicare Other | Admitting: Family Medicine

## 2014-10-18 VITALS — BP 118/80 | HR 78 | Temp 97.2°F | Resp 18 | Ht 74.0 in | Wt 214.0 lb

## 2014-10-18 DIAGNOSIS — E7800 Pure hypercholesterolemia, unspecified: Secondary | ICD-10-CM

## 2014-10-18 DIAGNOSIS — M8949 Other hypertrophic osteoarthropathy, multiple sites: Secondary | ICD-10-CM

## 2014-10-18 DIAGNOSIS — M159 Polyosteoarthritis, unspecified: Secondary | ICD-10-CM | POA: Insufficient documentation

## 2014-10-18 DIAGNOSIS — M150 Primary generalized (osteo)arthritis: Secondary | ICD-10-CM

## 2014-10-18 DIAGNOSIS — M5441 Lumbago with sciatica, right side: Secondary | ICD-10-CM

## 2014-10-18 DIAGNOSIS — I10 Essential (primary) hypertension: Secondary | ICD-10-CM

## 2014-10-18 HISTORY — DX: Polyosteoarthritis, unspecified: M15.9

## 2014-10-18 MED ORDER — TRAMADOL HCL 50 MG PO TABS: ORAL_TABLET | ORAL | Status: DC

## 2014-10-18 NOTE — Progress Notes (Signed)
Pre visit review using our clinic review tool, if applicable. No additional management support is needed unless otherwise documented below in the visit note. 

## 2014-10-18 NOTE — Progress Notes (Signed)
OFFICE NOTE  10/18/2014  CC:  Chief Complaint  Patient presents with  . Follow-up    fasting   HPI: Patient is a 74 y.o. Caucasian male who is here for 6 mo f/u HTN, Hyperlipidemia, chronic low back pain/DDD (arthritis in both big toes, too). Occasional BP monitoring at home shows mostly normal, rarely low. Compliant with meds. Takes occ NSAIDs.  Tramadol use: 3-4 per day.  Says he has occ LBP that radiates down right leg and keeps him from sleeping. Last imaging showed he was not at the point of surgery yet (Dr. Durene Cal).  Chronic pain in both big toes.  No redness or swelling in these joints.  ROS: no abd pain, no jaundice, no loss of appetite.  No chest pain or SOB or palpitations or heart racing.    Pertinent PMH:  Past medical, surgical, social, and family history reviewed and no changes are noted since last office visit.  MEDS:  Outpatient Prescriptions Prior to Visit  Medication Sig Dispense Refill  . allopurinol (ZYLOPRIM) 300 MG tablet Take 1 tablet by mouth  daily 90 tablet 1  . aspirin 81 MG tablet Take 81 mg by mouth daily.      Marland Kitchen atenolol (TENORMIN) 50 MG tablet 1/2 tab po qd 90 tablet 1  . CRESTOR 20 MG tablet Take 1 tablet by mouth  daily 90 tablet 1  . fluticasone (FLONASE) 50 MCG/ACT nasal spray Place 1 spray into both nostrils daily.    Marland Kitchen gabapentin (NEURONTIN) 300 MG capsule Take 300 mg by mouth 4 (four) times daily.     . hydrochlorothiazide (HYDRODIURIL) 25 MG tablet Take 1 tablet by mouth  daily 90 tablet 1  . loratadine (CLARITIN) 10 MG tablet Take 10 mg by mouth daily. As needed     . Olopatadine HCl 0.6 % SOLN Place into the nose 2 (two) times daily.    Marland Kitchen omeprazole (PRILOSEC) 20 MG capsule TAKE 1 CAPSULE BY MOUTH TWICE DAILY 30 MINUTES BEFORE BREAKFAST AND DINNER 180 capsule 2  . traMADol (ULTRAM) 50 MG tablet TAKE 1 TABLET BY MOUTH THREE TIMES DAILY AS NEEDED FOR FOR PAIN 90 tablet 5  . valsartan (DIOVAN) 320 MG tablet Take 1 tablet by mouth  daily 90  tablet 1  . sildenafil (VIAGRA) 100 MG tablet Take 1 tablet (100 mg total) by mouth as needed for erectile dysfunction. 10 tablet 0   No facility-administered medications prior to visit.    PE: Blood pressure 118/80, pulse 78, temperature 97.2 F (36.2 C), temperature source Temporal, resp. rate 18, height 6\' 2"  (1.88 m), weight 214 lb (97.07 kg), SpO2 100 %. Gen: Alert, well appearing.  Patient is oriented to person, place, time, and situation. CV: RRR, no m/r/g.   LUNGS: CTA bilat, nonlabored resps, good aeration in all lung fields. EXT: no clubbing, cyanosis, or edema.    LAB: none today RECENT:  Lab Results  Component Value Date   WBC 7.3 03/22/2014   HGB 14.1 03/22/2014   HCT 42.0 03/22/2014   MCV 92.3 03/22/2014   PLT 171.0 03/22/2014     Chemistry      Component Value Date/Time   NA 140 03/22/2014 1024   K 3.6 03/22/2014 1024   CL 103 03/22/2014 1024   CO2 29 03/22/2014 1024   BUN 12 03/22/2014 1024   CREATININE 0.9 03/22/2014 1024      Component Value Date/Time   CALCIUM 9.6 03/22/2014 1024   ALKPHOS 45 03/22/2014 1024  AST 51* 03/22/2014 1024   ALT 24 03/22/2014 1024   BILITOT 0.9 03/22/2014 1024     Lab Results  Component Value Date   TSH 1.29 03/22/2014   Lab Results  Component Value Date   CHOL 155 03/22/2014   HDL 57.80 03/22/2014   LDLCALC 77 03/22/2014   TRIG 103.0 03/22/2014   CHOLHDL 3 03/22/2014    IMPRESSION AND PLAN:  1) HTN: The current medical regimen is effective;  continue present plan and medications. He'll think about my suggestion of possibly decreasing his HCTZ to 12.5mg  qd in order to diminish chances of the occ low bp that he has (attempts to go off atenolol in the past have resulted in too much HR >100 that pt was not comfortable with).   Repeat labs at next f/u in 6 mo.  2) Hyperlipidemia: tolerating statin.  Lipids good 02/2014. Repeat FLP and AST/ALT at next f/u in 6 mo.  3) Chronic osteoarthritic pain: patient had  habit of using excessive NSAIDs in the past.  He currently uses NSAIDs sparingly and adds 3-4 tramadol pills throughout each day and is functional.  His use of this med has not escalated. I printed him a new Rx for the tramadol today.  An After Visit Summary was printed and given to the patient.  FOLLOW UP: 11mo (30 min f/u visit, repeat labs)

## 2014-11-05 ENCOUNTER — Telehealth: Payer: Self-pay | Admitting: Family Medicine

## 2014-11-05 MED ORDER — SILDENAFIL CITRATE 100 MG PO TABS: 100.0000 mg | ORAL_TABLET | ORAL | Status: DC | PRN

## 2014-11-05 NOTE — Telephone Encounter (Signed)
Patient aware.  He voiced understanding when I told him to NOT take more than 100mg  at a time.

## 2014-11-05 NOTE — Telephone Encounter (Signed)
100 mg is the max dose.  He should not take more than 100mg  dose (NO TAKING 2 AT A TIME!). I sent in new rx for the 100 mg tabs.

## 2014-11-05 NOTE — Telephone Encounter (Signed)
Pt requesting rf of viagra.  He wants to know if there's anything stronger than the 100 mg tabs, he says he sometimes has to take two tabs to get them to work.  Please advise.

## 2014-11-27 HISTORY — PX: OTHER SURGICAL HISTORY: SHX169

## 2014-11-27 HISTORY — PX: TRANSTHORACIC ECHOCARDIOGRAM: SHX275

## 2014-11-30 ENCOUNTER — Telehealth: Payer: Self-pay | Admitting: Family Medicine

## 2014-11-30 DIAGNOSIS — R5383 Other fatigue: Secondary | ICD-10-CM

## 2014-11-30 DIAGNOSIS — R0602 Shortness of breath: Secondary | ICD-10-CM

## 2014-11-30 DIAGNOSIS — R0789 Other chest pain: Secondary | ICD-10-CM

## 2014-11-30 NOTE — Telephone Encounter (Signed)
Please advise 

## 2014-11-30 NOTE — Telephone Encounter (Signed)
Pt. Called and would like a referral to a cardiologist. He is having tight chest, low energy and shortness of breath and would like to go to a heart doctor./dh

## 2014-12-02 NOTE — Telephone Encounter (Signed)
I placed order for referral to Dr. Aundra Dubin, whom he has seen in the past (2010). However, tell pt he can come see me first if he wants--if he thinks he can't wait to see the heart MD. Also, if patient is having episodes of chest pain brought on by exertion and relieved by rest, it is also appropriate to go to the nearest emergency department the next time he has an episode (or call 911). -thx

## 2014-12-03 ENCOUNTER — Encounter (HOSPITAL_COMMUNITY): Payer: Self-pay | Admitting: Emergency Medicine

## 2014-12-03 ENCOUNTER — Telehealth: Payer: Self-pay | Admitting: *Deleted

## 2014-12-03 ENCOUNTER — Inpatient Hospital Stay (HOSPITAL_COMMUNITY)
Admission: EM | Admit: 2014-12-03 | Discharge: 2014-12-05 | DRG: 247 | Disposition: A | Discharge status: Home / Self Care | Payer: Medicare Other | Attending: Cardiology | Admitting: Cardiology

## 2014-12-03 ENCOUNTER — Emergency Department (HOSPITAL_COMMUNITY): Payer: Medicare Other

## 2014-12-03 DIAGNOSIS — Z87891 Personal history of nicotine dependence: Secondary | ICD-10-CM

## 2014-12-03 DIAGNOSIS — R0789 Other chest pain: Secondary | ICD-10-CM | POA: Diagnosis not present

## 2014-12-03 DIAGNOSIS — I200 Unstable angina: Secondary | ICD-10-CM | POA: Diagnosis present

## 2014-12-03 DIAGNOSIS — M545 Low back pain: Secondary | ICD-10-CM | POA: Diagnosis present

## 2014-12-03 DIAGNOSIS — I25110 Atherosclerotic heart disease of native coronary artery with unstable angina pectoris: Secondary | ICD-10-CM | POA: Diagnosis not present

## 2014-12-03 DIAGNOSIS — E7800 Pure hypercholesterolemia, unspecified: Secondary | ICD-10-CM

## 2014-12-03 DIAGNOSIS — M109 Gout, unspecified: Secondary | ICD-10-CM | POA: Diagnosis present

## 2014-12-03 DIAGNOSIS — Q245 Malformation of coronary vessels: Secondary | ICD-10-CM

## 2014-12-03 DIAGNOSIS — G8929 Other chronic pain: Secondary | ICD-10-CM | POA: Diagnosis present

## 2014-12-03 DIAGNOSIS — I2582 Chronic total occlusion of coronary artery: Secondary | ICD-10-CM | POA: Diagnosis present

## 2014-12-03 DIAGNOSIS — Z85828 Personal history of other malignant neoplasm of skin: Secondary | ICD-10-CM

## 2014-12-03 DIAGNOSIS — M159 Polyosteoarthritis, unspecified: Secondary | ICD-10-CM | POA: Diagnosis present

## 2014-12-03 DIAGNOSIS — Z7982 Long term (current) use of aspirin: Secondary | ICD-10-CM

## 2014-12-03 DIAGNOSIS — Z79899 Other long term (current) drug therapy: Secondary | ICD-10-CM

## 2014-12-03 DIAGNOSIS — W1830XA Fall on same level, unspecified, initial encounter: Secondary | ICD-10-CM | POA: Diagnosis present

## 2014-12-03 DIAGNOSIS — R55 Syncope and collapse: Secondary | ICD-10-CM | POA: Diagnosis present

## 2014-12-03 DIAGNOSIS — H9190 Unspecified hearing loss, unspecified ear: Secondary | ICD-10-CM | POA: Diagnosis present

## 2014-12-03 DIAGNOSIS — I2510 Atherosclerotic heart disease of native coronary artery without angina pectoris: Secondary | ICD-10-CM | POA: Insufficient documentation

## 2014-12-03 DIAGNOSIS — R739 Hyperglycemia, unspecified: Secondary | ICD-10-CM | POA: Diagnosis present

## 2014-12-03 DIAGNOSIS — E876 Hypokalemia: Secondary | ICD-10-CM | POA: Diagnosis present

## 2014-12-03 DIAGNOSIS — K219 Gastro-esophageal reflux disease without esophagitis: Secondary | ICD-10-CM | POA: Diagnosis present

## 2014-12-03 DIAGNOSIS — I10 Essential (primary) hypertension: Secondary | ICD-10-CM | POA: Diagnosis present

## 2014-12-03 DIAGNOSIS — E785 Hyperlipidemia, unspecified: Secondary | ICD-10-CM | POA: Diagnosis present

## 2014-12-03 LAB — BASIC METABOLIC PANEL
Anion gap: 8 (ref 5–15)
BUN: 9 mg/dL (ref 6–23)
CALCIUM: 9 mg/dL (ref 8.4–10.5)
CO2: 25 mmol/L (ref 19–32)
Chloride: 101 mmol/L (ref 96–112)
Creatinine, Ser: 1.16 mg/dL (ref 0.50–1.35)
GFR calc Af Amer: 71 mL/min — ABNORMAL LOW (ref >90)
GFR calc non Af Amer: 62 mL/min — ABNORMAL LOW (ref >90)
Glucose, Bld: 189 mg/dL — ABNORMAL HIGH (ref 70–99)
POTASSIUM: 3.4 mmol/L — AB (ref 3.5–5.1)
Sodium: 134 mmol/L — ABNORMAL LOW (ref 135–145)

## 2014-12-03 LAB — CBC
HCT: 39.6 % (ref 39.0–52.0)
HEMOGLOBIN: 13.5 g/dL (ref 13.0–17.0)
MCH: 28.5 pg (ref 26.0–34.0)
MCHC: 34.1 g/dL (ref 30.0–36.0)
MCV: 83.5 fL (ref 78.0–100.0)
Platelets: 223 10*3/uL (ref 150–400)
RBC: 4.74 MIL/uL (ref 4.22–5.81)
RDW: 15.1 % (ref 11.5–15.5)
WBC: 9.5 10*3/uL (ref 4.0–10.5)

## 2014-12-03 LAB — I-STAT TROPONIN, ED: Troponin i, poc: 0.02 ng/mL (ref 0.00–0.08)

## 2014-12-03 LAB — TROPONIN I: TROPONIN I: 0.03 ng/mL (ref <0.031)

## 2014-12-03 MED ORDER — ASPIRIN EC 81 MG PO TBEC
81.0000 mg | DELAYED_RELEASE_TABLET | Freq: Every day | ORAL | Status: DC
  Administered 2014-12-04: 81 mg via ORAL
  Filled 2014-12-03 (×2): qty 1

## 2014-12-03 MED ORDER — SODIUM CHLORIDE 0.9 % IV BOLUS (SEPSIS)
1000.0000 mL | Freq: Once | INTRAVENOUS | Status: AC
  Administered 2014-12-03: 1000 mL via INTRAVENOUS

## 2014-12-03 MED ORDER — OLOPATADINE HCL 0.6 % NA SOLN: Freq: Two times a day (BID) | NASAL | Status: DC

## 2014-12-03 MED ORDER — GABAPENTIN 300 MG PO CAPS
300.0000 mg | ORAL_CAPSULE | Freq: Four times a day (QID) | ORAL | Status: DC
  Administered 2014-12-03 – 2014-12-05 (×6): 300 mg via ORAL
  Filled 2014-12-03 (×9): qty 1

## 2014-12-03 MED ORDER — ONDANSETRON HCL 4 MG/2ML IJ SOLN: 4.0000 mg | Freq: Four times a day (QID) | INTRAMUSCULAR | Status: DC | PRN

## 2014-12-03 MED ORDER — ACETAMINOPHEN 325 MG PO TABS
650.0000 mg | ORAL_TABLET | ORAL | Status: DC | PRN
  Administered 2014-12-03 – 2014-12-04 (×2): 650 mg via ORAL
  Filled 2014-12-03 (×2): qty 2

## 2014-12-03 MED ORDER — ROSUVASTATIN CALCIUM 20 MG PO TABS
20.0000 mg | ORAL_TABLET | Freq: Every day | ORAL | Status: DC
  Administered 2014-12-04 – 2014-12-05 (×2): 20 mg via ORAL
  Filled 2014-12-03: qty 2
  Filled 2014-12-03: qty 1

## 2014-12-03 MED ORDER — AZELASTINE HCL 0.1 % NA SOLN
1.0000 | Freq: Two times a day (BID) | NASAL | Status: DC
  Administered 2014-12-04 – 2014-12-05 (×2): 1 via NASAL
  Filled 2014-12-03 (×2): qty 30

## 2014-12-03 MED ORDER — GI COCKTAIL ~~LOC~~
30.0000 mL | Freq: Four times a day (QID) | ORAL | Status: DC | PRN
  Filled 2014-12-03: qty 30

## 2014-12-03 MED ORDER — PANTOPRAZOLE SODIUM 40 MG PO TBEC
40.0000 mg | DELAYED_RELEASE_TABLET | Freq: Every day | ORAL | Status: DC
  Administered 2014-12-04 – 2014-12-05 (×2): 40 mg via ORAL
  Filled 2014-12-03 (×2): qty 1

## 2014-12-03 MED ORDER — LORATADINE 10 MG PO TABS
10.0000 mg | ORAL_TABLET | Freq: Every day | ORAL | Status: DC
  Administered 2014-12-04 – 2014-12-05 (×2): 10 mg via ORAL
  Filled 2014-12-03 (×2): qty 1

## 2014-12-03 MED ORDER — FLUTICASONE PROPIONATE 50 MCG/ACT NA SUSP
1.0000 | Freq: Every day | NASAL | Status: DC
  Administered 2014-12-04 – 2014-12-05 (×2): 1 via NASAL
  Filled 2014-12-03 (×2): qty 16

## 2014-12-03 MED ORDER — MORPHINE SULFATE 2 MG/ML IJ SOLN: 2.0000 mg | INTRAMUSCULAR | Status: DC | PRN

## 2014-12-03 MED ORDER — SODIUM CHLORIDE 0.9 % IV SOLN
INTRAVENOUS | Status: DC
  Administered 2014-12-03: 75 mL/h via INTRAVENOUS

## 2014-12-03 MED ORDER — INSULIN ASPART 100 UNIT/ML ~~LOC~~ SOLN: 0.0000 [IU] | Freq: Three times a day (TID) | SUBCUTANEOUS | Status: DC

## 2014-12-03 MED ORDER — ZOLPIDEM TARTRATE 5 MG PO TABS
5.0000 mg | ORAL_TABLET | Freq: Every evening | ORAL | Status: DC | PRN
  Administered 2014-12-03: 5 mg via ORAL
  Filled 2014-12-03: qty 1

## 2014-12-03 MED ORDER — ALLOPURINOL 300 MG PO TABS
300.0000 mg | ORAL_TABLET | Freq: Every day | ORAL | Status: DC
  Administered 2014-12-04 – 2014-12-05 (×2): 300 mg via ORAL
  Filled 2014-12-03 (×2): qty 1

## 2014-12-03 MED ORDER — POTASSIUM CHLORIDE 20 MEQ/15ML (10%) PO SOLN
40.0000 meq | Freq: Once | ORAL | Status: AC
  Administered 2014-12-03: 40 meq via ORAL
  Filled 2014-12-03: qty 30

## 2014-12-03 MED ORDER — HEPARIN SODIUM (PORCINE) 5000 UNIT/ML IJ SOLN
5000.0000 [IU] | Freq: Three times a day (TID) | INTRAMUSCULAR | Status: DC
  Administered 2014-12-04: 5000 [IU] via SUBCUTANEOUS
  Filled 2014-12-03: qty 1

## 2014-12-03 NOTE — Telephone Encounter (Signed)
Tied to contact pt but no answer. Will try again later.

## 2014-12-03 NOTE — ED Notes (Signed)
Attempted to call report

## 2014-12-03 NOTE — Telephone Encounter (Signed)
Patient notified. Patient stated that he took bp again after lunch and after walking. His Bp was 154/78 pulse 101.

## 2014-12-03 NOTE — ED Notes (Signed)
bp in R arm 108/66 and 117/66 in L

## 2014-12-03 NOTE — Telephone Encounter (Signed)
Patient notified. Patient stated that he is going to ER.

## 2014-12-03 NOTE — Telephone Encounter (Signed)
Pt needs to go to the nearest emergency department for further evaluation b/c of the CP and other sx's he is describing.  He does not have to cancel appt with Dr. Duffy Bruce

## 2014-12-03 NOTE — H&P (Addendum)
[facility] History and Physical  Brandon Haynes:[id number] DOB: 04-Apr-1941 DOA: 12/03/2014  Referring physician: Sherwood Gambler, MD PCP: Tammi Sou, MD   Chief Complaint: Chest Tightness  HPI: Brandon Haynes is a 74 y.o. male presents with syncope. He states that he has been having dizziness for at least a year. He states that he has noted it is getting worse over time. He states that he had an episode today with tightness in his chest. Patient states that he has noted this with activity,. He states that he was noted to have a low blood pressure also. He states that he has had noted an increased heart rate. He states that he also felt like he was going to pass out yesterday and around 8 PM he states that he actually did and fell to the ground. He states that he has seen a cardiologist over 5 years ago. He had a stress test which was fine at that time. He states he has never had a heart cath done. His symptoms now have been getting worse over the last few months with the chest tightness.   Review of Systems:  Constitutional:  No weight loss, night sweats, Fevers, chills, fatigue.  HEENT:  No headaches, No sneezing, itching, ear ache, nasal congestion, post nasal drip,  Cardio-vascular:  ++ chest tightness, no Orthopnea, PND, swelling in lower extremities +dizziness  GI:  No heartburn, indigestion, abdominal pain, nausea, vomiting, diarrhea  Resp:  No shortness of breath with exertion or at rest. No non-productive cough, No coughing up of blood.No change in color of mucus Skin:  no rash or lesions.  GU:  no dysuria, change in color of urine, no urgency or frequency Musculoskeletal:  No joint pain or swelling. No decreased range of motion Psych:  No change in mood or affect. No depression or anxiety  Past Medical History  Diagnosis Date  . Chest pain 2010    stress testing neg; GERD likely  . Hypertension   . Hypercholesteremia   . Hearing loss   . Allergic rhinitis      allergy shots via [place]  . Peripheral vascular disease   . GERD (gastroesophageal reflux disease)   . Hx of colonic polyps   . DDD (degenerative disc disease), lumbar   . Gout   . Chronic low back pain   . Anxiety   . History  of basal cell carcinoma   . Pleural thickening     chronic  . Osteoarthritis of multiple joints 10/18/2014   Past Surgical History  Procedure Laterality Date  . Back injection  oct 2011, nov 2011    no surgery  . Shoulder injection  feb 2014  . Back injection  01/11/2013  . Rotator cuff repair  09/2013    left  . Cardiovascular stress test  05/2009    negative/normal  . Colonoscopy  08/2008    repeat 7 yrs   Social History:  reports that he quit smoking about 16 years ago. His smoking use included Cigarettes. He has a 70 pack-year smoking history. He has quit using smokeless tobacco. He reports that he does not drink alcohol or use illicit drugs.  No Known Allergies  No family history on file.   Prior to Admission medications   Medication Sig Start Date End Date Taking? Authorizing Provider  allopurinol (ZYLOPRIM) 300 MG tablet Take 1 tablet by mouth  daily 07/02/14  Yes Tammi Sou, MD  aspirin 81 MG tablet Take 81 mg by  mouth daily.     Yes Historical Provider, MD  atenolol (TENORMIN) 50 MG tablet 1/2 tab po qd 06/05/14  Yes Tammi Sou, MD  CRESTOR 20 MG tablet Take 1 tablet by mouth  daily 07/02/14  Yes Tammi Sou, MD  fluticasone (FLONASE) 50 MCG/ACT nasal spray Place 1 spray into both nostrils daily.   Yes Historical Provider, MD  gabapentin (NEURONTIN) 300 MG capsule Take 300 mg by mouth 4 (four) times daily.    Yes Historical Provider, MD  hydrochlorothiazide (HYDRODIURIL) 25 MG tablet Take 1 tablet by mouth  daily 07/02/14  Yes Tammi Sou, MD  loratadine (CLARITIN) 10 MG tablet Take 10 mg by mouth daily. As needed    Yes Historical Provider, MD  Olopatadine HCl 0.6 % SOLN Place into the nose 2 (two) times daily.   Yes  Historical Provider, MD  omeprazole (PRILOSEC) 20 MG capsule TAKE 1 CAPSULE BY MOUTH TWICE DAILY 30 MINUTES BEFORE BREAKFAST AND DINNER 08/09/14  Yes Tammi Sou, MD  sildenafil (VIAGRA) 100 MG tablet Take 1 tablet (100 mg total) by mouth as needed for erectile dysfunction. 11/05/14 07/03/16 Yes Tammi Sou, MD  traMADol (ULTRAM) 50 MG tablet TAKE 1 TABLET BY MOUTH THREE TIMES DAILY AS NEEDED FOR FOR PAIN 10/18/14  Yes Tammi Sou, MD  valsartan (DIOVAN) 320 MG tablet Take 1 tablet by mouth  daily 07/02/14  Yes Tammi Sou, MD   Physical Exam: Filed Vitals:   12/03/14 2000 12/03/14 2015 12/03/14 2030 12/03/14 2044  BP: 93/55 94/57 105/59   Pulse: 72 74 69   Temp:    98.2 F (36.8 C)  TempSrc:    Oral  Resp: 9 26 12    Height:      Weight:      SpO2: 94% 98% 94%     Wt Readings from Last 3 Encounters:  12/03/14 95.879 kg (211 lb 6 oz)  10/18/14 97.07 kg (214 lb)  04/19/14 97.523 kg (215 lb)    General:  Appears calm and comfortable Eyes: PERRL, normal lids, irises & conjunctiva ENT: grossly normal hearing, lips & tongue Neck: no LAD, masses or thyromegaly Cardiovascular: RRR, no m/r/g. No LE edema. Respiratory: CTA bilaterally, no w/r/r. Normal respiratory effort. Abdomen: soft, ntnd Skin: no rash or induration seen on limited exam Musculoskeletal: grossly normal tone BUE/BLE Psychiatric: grossly normal mood and affect, speech fluent and appropriate Neurologic: grossly non-focal.          Labs on Admission:  Basic Metabolic Panel:  Recent Labs Lab 12/03/14 1827  NA 134*  K 3.4*  CL 101  CO2 25  GLUCOSE 189*  BUN 9  CREATININE 1.16  CALCIUM 9.0   Liver Function Tests: No results for input(s): AST, ALT, ALKPHOS, BILITOT, PROT, ALBUMIN in the last 168 hours. No results for input(s): LIPASE, AMYLASE in the last 168 hours. No results for input(s): AMMONIA in the last 168 hours. CBC:  Recent Labs Lab 12/03/14 1827  WBC 9.5  HGB 13.5  HCT 39.6    MCV 83.5  PLT 223   Cardiac Enzymes: No results for input(s): CKTOTAL, CKMB, CKMBINDEX, TROPONINI in the last 168 hours.  BNP (last 3 results) No results for input(s): BNP in the last 8760 hours.  ProBNP (last 3 results) No results for input(s): PROBNP in the last 8760 hours.  CBG: No results for input(s): GLUCAP in the last 168 hours.  Radiological Exams on Admission: Dg Chest 2 View  12/03/2014  CLINICAL DATA:  Intermittent dizziness and chest tightness for 1 month. Patient passed out on Sunday night. No injury. Shortness of breath and leg tingling. Previous smoker.  EXAM: CHEST  2 VIEW  COMPARISON:  02/21/2013  FINDINGS: Normal heart size and pulmonary vascularity. No focal airspace disease or consolidation in the lungs. No blunting of costophrenic angles. No pneumothorax. Mediastinal contours appear intact. Calcified granuloma in the left lung base. Calcified aorta.  IMPRESSION: No active cardiopulmonary disease.   Electronically Signed   By: Lucienne Capers M.D.   On: 12/03/2014 18:46     Assessment/Plan Principal Problem:   Chest tightness Active Problems:   HYPERCHOLESTEROLEMIA   Essential hypertension   Syncope   1. Chest Tightness -will admit for observation -will get serial enzymes -ASA given -will get echo may need stress test  2. Syncope -echo ordered -will hold blood pressure medications as his pressures were on the low side -monitor pressures -will also get carotid doppler  3. Hyperlipidemia -continue with statins  4. HTN -pressures were on low side -will monitor  -hold antihypertensives for now  5. Hypokalemia -will give one dose of [place]  6. Hyperglycemia -will check A1C -monitor FSBS    Code Status: Full Code (must indicate code status--if unknown or must be presumed, indicate so) DVT Prophylaxis:Heparin Family Communication: None (indicate person spoken with, if applicable, with phone number if by telephone) Disposition Plan: Home  (indicate anticipated LOS)  Time spent: 76min  Legend Tumminello A [facility] Pager [phone]

## 2014-12-03 NOTE — ED Notes (Signed)
Patient states he has had episodes for several weeks causing shortness of breath and low blood pressure. Anytime he exerts himself he experiences the issue. On Sunday he actually lost consciousness and fell on to carpet, he did hit his head but had no sign of injury per patient, denies headaches since fall. Experiences dizzy spells two to three times a day. Some leg weakness associated with symptoms.

## 2014-12-03 NOTE — Telephone Encounter (Signed)
Patient got an appt with Dr. Harrington Challenger 12/14/14. When patient was talking to the cardiovascular triage nurse she advised patient to call PCP to let us know that his bp last night at 8:30PM 84/48, when he stood up he was weak he drank a 16 oz coke zero which didn't help much. This morning his bp readings were 117/70 at 8:30AM and at 10:30AM. His pulse was 79 at 8:30AM. He took a shower and felt a tightness in his chest & noticed his pulse was 100 at 10:30AM. He has not taken his diuretic this morning but will take his atenolol & diavan this afternoon. Patient requests CB.

## 2014-12-03 NOTE — ED Notes (Signed)
MD at bedside. Admitting MD.

## 2014-12-03 NOTE — ED Provider Notes (Signed)
CSN: [id number]     Arrival date & time 12/03/14  1814 History   First MD Initiated Contact with Patient 12/03/14 1853     Chief Complaint  Patient presents with  . Chest Pain  . Loss of Consciousness     (Consider location/radiation/quality/duration/timing/severity/associated sxs/prior Treatment) HPI  74 year old male presents with loss of consciousness that occurred last night around 8 PM. He states that he was sitting in his recliner and when he set up started to feel little bit lightheaded but continue walking to the kitchen. After a few more steps he felt even more dizzy and passed out and fell to the ground. He denies any headache. He did note chest tightness right before he passed out. No chest tightness right after. This tightness is similar to the pain is felt after doing about 10-15 minutes of exertion. His exertional chest tightness has been progressively worsening over last couple months. He does associate with shortness of breath. Patient denies any symptoms now. Does not feel lightheaded. Does have a history of orthostatic dizziness but feels like this is a little bit different. Never has the chest tightness. Had a stress test a couple years ago and was told it was normal. Patient denies feeling ill otherwise.  Past Medical History  Diagnosis Date  . Chest pain 2010    stress testing neg; GERD likely  . Hypertension   . Hypercholesteremia   . Hearing loss   . Allergic rhinitis     allergy shots via [place]  . Peripheral vascular disease   . GERD (gastroesophageal reflux disease)   . Hx of colonic polyps   . DDD (degenerative disc disease), lumbar   . Gout   . Chronic low back pain   . Anxiety   . History  of basal cell carcinoma   . Pleural thickening     chronic  . Osteoarthritis of multiple joints 10/18/2014   Past Surgical History  Procedure Laterality Date  . Back injection  oct 2011, nov 2011    no surgery  . Shoulder injection  feb 2014  . Back injection   01/11/2013  . Rotator cuff repair  09/2013    left  . Cardiovascular stress test  05/2009    negative/normal  . Colonoscopy  08/2008    repeat 7 yrs   No family history on file. History  Substance Use Topics  . Smoking status: Former Smoker -- 2.00 packs/day for 35 years    Types: Cigarettes    Quit date: 09/28/1998  . Smokeless tobacco: Former Systems developer     Comment: as a teenager  . Alcohol Use: No     Comment: quit 1994    Review of Systems  Constitutional: Negative for fever and chills.  Respiratory: Positive for chest tightness and shortness of breath.   Cardiovascular: Positive for chest pain.  Gastrointestinal: Negative for vomiting and diarrhea.  Neurological: Positive for dizziness and syncope.  All other systems reviewed and are negative.     Allergies  Review of patient's allergies indicates no known allergies.  Home Medications   Prior to Admission medications   Medication Sig Start Date End Date Taking? Authorizing Provider  allopurinol (ZYLOPRIM) 300 MG tablet Take 1 tablet by mouth  daily 07/02/14  Yes Tammi Sou, MD  aspirin 81 MG tablet Take 81 mg by mouth daily.     Yes Historical Provider, MD  atenolol (TENORMIN) 50 MG tablet 1/2 tab po qd 06/05/14  Yes Tammi Sou, MD  CRESTOR 20 MG tablet Take 1 tablet by mouth  daily 07/02/14  Yes Tammi Sou, MD  fluticasone (FLONASE) 50 MCG/ACT nasal spray Place 1 spray into both nostrils daily.   Yes Historical Provider, MD  gabapentin (NEURONTIN) 300 MG capsule Take 300 mg by mouth 4 (four) times daily.    Yes Historical Provider, MD  hydrochlorothiazide (HYDRODIURIL) 25 MG tablet Take 1 tablet by mouth  daily 07/02/14  Yes Tammi Sou, MD  loratadine (CLARITIN) 10 MG tablet Take 10 mg by mouth daily. As needed    Yes Historical Provider, MD  Olopatadine HCl 0.6 % SOLN Place into the nose 2 (two) times daily.   Yes Historical Provider, MD  omeprazole (PRILOSEC) 20 MG capsule TAKE 1 CAPSULE BY MOUTH TWICE  DAILY 30 MINUTES BEFORE BREAKFAST AND DINNER 08/09/14  Yes Tammi Sou, MD  sildenafil (VIAGRA) 100 MG tablet Take 1 tablet (100 mg total) by mouth as needed for erectile dysfunction. 11/05/14 07/03/16 Yes Tammi Sou, MD  traMADol (ULTRAM) 50 MG tablet TAKE 1 TABLET BY MOUTH THREE TIMES DAILY AS NEEDED FOR FOR PAIN 10/18/14  Yes Tammi Sou, MD  valsartan (DIOVAN) 320 MG tablet Take 1 tablet by mouth  daily 07/02/14  Yes Tammi Sou, MD   BP 108/66 mmHg  Pulse 79  Temp(Src) 97.6 F (36.4 C) (Oral)  Resp 18  Ht 6\' 2"  (1.88 m)  Wt 211 lb 6 oz (95.879 kg)  BMI 27.13 kg/m2  SpO2 97% Physical Exam  Constitutional: He is oriented to person, place, and time. He appears well-developed and well-nourished.  HENT:  Head: Normocephalic and atraumatic.  Right Ear: External ear normal.  Left Ear: External ear normal.  Nose: Nose normal.  Eyes: Right eye exhibits no discharge. Left eye exhibits no discharge.  Neck: Neck supple.  Cardiovascular: Normal rate, regular rhythm, normal heart sounds and intact distal pulses.   Pulmonary/Chest: Effort normal and breath sounds normal.  Abdominal: Soft. He exhibits no distension. There is no tenderness.  Musculoskeletal: He exhibits no edema.  Neurological: He is alert and oriented to person, place, and time.  Skin: Skin is warm and dry.  Nursing note and vitals reviewed.   ED Course  Procedures (including critical care time) Labs Review Labs Reviewed  BASIC METABOLIC PANEL - Abnormal; Notable for the following:    Sodium 134 (*)    Potassium 3.4 (*)    Glucose, Bld 189 (*)    GFR calc non Af Amer 62 (*)    GFR calc Af Amer 71 (*)    All other components within normal limits  CBC  I-STAT TROPOININ, ED    Imaging Review Dg Chest 2 View  12/03/2014   CLINICAL DATA:  Intermittent dizziness and chest tightness for 1 month. Patient passed out on Sunday night. No injury. Shortness of breath and leg tingling. Previous smoker.  EXAM:  CHEST  2 VIEW  COMPARISON:  02/21/2013  FINDINGS: Normal heart size and pulmonary vascularity. No focal airspace disease or consolidation in the lungs. No blunting of costophrenic angles. No pneumothorax. Mediastinal contours appear intact. Calcified granuloma in the left lung base. Calcified aorta.  IMPRESSION: No active cardiopulmonary disease.   Electronically Signed   By: Lucienne Capers M.D.   On: 12/03/2014 18:46     EKG Interpretation   Date/Time:  Monday December 03 2014 18:17:10 EST Ventricular Rate:  79 PR Interval:  180 QRS Duration: 86 QT Interval:  376 QTC Calculation: 431  R Axis:   49 Text Interpretation:  Normal sinus rhythm Normal ECG No old tracing to  compare Confirmed by [place] ([id number]) on 12/03/2014 6:53:40 PM      MDM   Final diagnoses:  Chest tightness  Syncope, unspecified syncope type    Patient with worsening exertional chest tightness over the past 1-2 months. Now had chest tightness just prior to syncope. Uncertain if this is ACS as he has no pain currently. However with his risk factors and worsening exertional angina will admit to the hospital for serial enzymes and ACS rule out.   Sherwood Gambler, MD 12/03/14 2351

## 2014-12-03 NOTE — ED Notes (Addendum)
Pt reports intermittent dizziness and chest tightness x 1 month. Pt sts Sunday night he passed out, falling on carpet. No injury sustained. Pt reports sob, and leg tingling as well. sts on Sunday when he fell his bp was 80/48.

## 2014-12-03 NOTE — Telephone Encounter (Signed)
Spoke with pt who was transferred to me by scheduling to evaluate his recent s/s.  He was last seen here by Dr Aundra Dubin 07/24/2009.  His PCP is referring him back for the evaluation of chest tightness and SOB with activity.  He reports last night while standing in the kitchen he became lightheaded and dizzy.  He reports he took 2 steps and "blacked up"  When he was able, he made it the the couch and took his BP which was 84/48 with HR of 75.  He reports feeling "bad" and then drank a 16 oz Coke Zero (for the NA).  This AM his BP was 117/74 with HR 79.  He took a shower this morning and developed chest tightness - BP 117/70 HR 100 - chest tightness resolved with rest.  He walked to mailbox and had CP again.  No chest pain now.  Pt was given a New Pt appt with Dr Harrington Challenger 3/18 but advised pt he needs to report hypotension to his PCP to possibly adjust medications.  He reports understanding.  Also advised to go to ED for further eval if CP returns or if he passes out again but that he shouldn't drive himself.  Pt states understanding.  Will attempt to find an earlier New Pt appt.

## 2014-12-04 ENCOUNTER — Encounter (HOSPITAL_COMMUNITY)
Admission: EM | Disposition: A | Discharge status: Home / Self Care | Payer: Medicare Other | Source: Home / Self Care | Attending: Cardiology

## 2014-12-04 DIAGNOSIS — G8929 Other chronic pain: Secondary | ICD-10-CM | POA: Diagnosis present

## 2014-12-04 DIAGNOSIS — I200 Unstable angina: Secondary | ICD-10-CM | POA: Diagnosis present

## 2014-12-04 DIAGNOSIS — Z87891 Personal history of nicotine dependence: Secondary | ICD-10-CM | POA: Diagnosis not present

## 2014-12-04 DIAGNOSIS — I25110 Atherosclerotic heart disease of native coronary artery with unstable angina pectoris: Principal | ICD-10-CM

## 2014-12-04 DIAGNOSIS — K219 Gastro-esophageal reflux disease without esophagitis: Secondary | ICD-10-CM | POA: Diagnosis not present

## 2014-12-04 DIAGNOSIS — R55 Syncope and collapse: Secondary | ICD-10-CM

## 2014-12-04 DIAGNOSIS — E876 Hypokalemia: Secondary | ICD-10-CM | POA: Diagnosis present

## 2014-12-04 DIAGNOSIS — Z79899 Other long term (current) drug therapy: Secondary | ICD-10-CM | POA: Diagnosis not present

## 2014-12-04 DIAGNOSIS — I2582 Chronic total occlusion of coronary artery: Secondary | ICD-10-CM | POA: Diagnosis not present

## 2014-12-04 DIAGNOSIS — I10 Essential (primary) hypertension: Secondary | ICD-10-CM

## 2014-12-04 DIAGNOSIS — R0789 Other chest pain: Secondary | ICD-10-CM

## 2014-12-04 DIAGNOSIS — E780 Pure hypercholesterolemia: Secondary | ICD-10-CM

## 2014-12-04 DIAGNOSIS — Z7982 Long term (current) use of aspirin: Secondary | ICD-10-CM | POA: Diagnosis not present

## 2014-12-04 DIAGNOSIS — W1830XA Fall on same level, unspecified, initial encounter: Secondary | ICD-10-CM | POA: Diagnosis present

## 2014-12-04 DIAGNOSIS — Q245 Malformation of coronary vessels: Secondary | ICD-10-CM | POA: Diagnosis not present

## 2014-12-04 DIAGNOSIS — M109 Gout, unspecified: Secondary | ICD-10-CM | POA: Diagnosis present

## 2014-12-04 DIAGNOSIS — M159 Polyosteoarthritis, unspecified: Secondary | ICD-10-CM | POA: Diagnosis present

## 2014-12-04 DIAGNOSIS — Z85828 Personal history of other malignant neoplasm of skin: Secondary | ICD-10-CM | POA: Diagnosis not present

## 2014-12-04 DIAGNOSIS — M545 Low back pain: Secondary | ICD-10-CM | POA: Diagnosis present

## 2014-12-04 DIAGNOSIS — H9190 Unspecified hearing loss, unspecified ear: Secondary | ICD-10-CM | POA: Diagnosis present

## 2014-12-04 DIAGNOSIS — R739 Hyperglycemia, unspecified: Secondary | ICD-10-CM | POA: Diagnosis present

## 2014-12-04 DIAGNOSIS — E785 Hyperlipidemia, unspecified: Secondary | ICD-10-CM | POA: Diagnosis present

## 2014-12-04 HISTORY — PX: LEFT HEART CATHETERIZATION WITH CORONARY ANGIOGRAM: SHX5451

## 2014-12-04 HISTORY — PX: CORONARY ANGIOPLASTY WITH STENT PLACEMENT: SHX49

## 2014-12-04 LAB — BASIC METABOLIC PANEL
Anion gap: 3 — ABNORMAL LOW (ref 5–15)
BUN: 6 mg/dL (ref 6–23)
CHLORIDE: 109 mmol/L (ref 96–112)
CO2: 25 mmol/L (ref 19–32)
Calcium: 8.6 mg/dL (ref 8.4–10.5)
Creatinine, Ser: 0.94 mg/dL (ref 0.50–1.35)
GFR calc Af Amer: >90 mL/min (ref >90)
GFR calc non Af Amer: 82 mL/min — ABNORMAL LOW (ref >90)
Glucose, Bld: 128 mg/dL — ABNORMAL HIGH (ref 70–99)
POTASSIUM: 3.6 mmol/L (ref 3.5–5.1)
Sodium: 137 mmol/L (ref 135–145)

## 2014-12-04 LAB — CBC
HCT: 36.4 % — ABNORMAL LOW (ref 39.0–52.0)
Hemoglobin: 12.3 g/dL — ABNORMAL LOW (ref 13.0–17.0)
MCH: 28.1 pg (ref 26.0–34.0)
MCHC: 33.8 g/dL (ref 30.0–36.0)
MCV: 83.1 fL (ref 78.0–100.0)
Platelets: 177 10*3/uL (ref 150–400)
RBC: 4.38 MIL/uL (ref 4.22–5.81)
RDW: 15.2 % (ref 11.5–15.5)
WBC: 4.8 10*3/uL (ref 4.0–10.5)

## 2014-12-04 LAB — GLUCOSE, CAPILLARY
Glucose-Capillary: 105 mg/dL — ABNORMAL HIGH (ref 70–99)
Glucose-Capillary: 123 mg/dL — ABNORMAL HIGH (ref 70–99)
Glucose-Capillary: 85 mg/dL (ref 70–99)
Glucose-Capillary: 89 mg/dL (ref 70–99)

## 2014-12-04 LAB — TROPONIN I: Troponin I: 0.03 ng/mL (ref <0.031)

## 2014-12-04 LAB — POCT ACTIVATED CLOTTING TIME: Activated Clotting Time: 546 seconds

## 2014-12-04 SURGERY — LEFT HEART CATHETERIZATION WITH CORONARY ANGIOGRAM
Anesthesia: LOCAL

## 2014-12-04 MED ORDER — NITROGLYCERIN 1 MG/10 ML FOR IR/CATH LAB
INTRA_ARTERIAL | Status: AC
  Filled 2014-12-04: qty 10

## 2014-12-04 MED ORDER — MIDAZOLAM HCL 2 MG/2ML IJ SOLN
INTRAMUSCULAR | Status: AC
  Filled 2014-12-04: qty 2

## 2014-12-04 MED ORDER — TICAGRELOR 90 MG PO TABS
ORAL_TABLET | ORAL | Status: AC
  Filled 2014-12-04: qty 1

## 2014-12-04 MED ORDER — ASPIRIN 81 MG PO CHEW
81.0000 mg | CHEWABLE_TABLET | Freq: Every day | ORAL | Status: DC
  Administered 2014-12-05: 81 mg via ORAL
  Filled 2014-12-04: qty 1

## 2014-12-04 MED ORDER — TRAMADOL HCL 50 MG PO TABS
50.0000 mg | ORAL_TABLET | Freq: Every day | ORAL | Status: DC | PRN
  Administered 2014-12-04: 50 mg via ORAL
  Filled 2014-12-04 (×2): qty 1

## 2014-12-04 MED ORDER — SODIUM CHLORIDE 0.9 % IJ SOLN: 3.0000 mL | Freq: Two times a day (BID) | INTRAMUSCULAR | Status: DC

## 2014-12-04 MED ORDER — SODIUM CHLORIDE 0.9 % IJ SOLN: 3.0000 mL | INTRAMUSCULAR | Status: DC | PRN

## 2014-12-04 MED ORDER — FENTANYL CITRATE 0.05 MG/ML IJ SOLN
INTRAMUSCULAR | Status: AC
  Filled 2014-12-04: qty 2

## 2014-12-04 MED ORDER — SODIUM CHLORIDE 0.9 % IV SOLN
1.0000 mL/kg/h | INTRAVENOUS | Status: AC
  Administered 2014-12-04: 18:00:00 1 mL/kg/h via INTRAVENOUS

## 2014-12-04 MED ORDER — SODIUM CHLORIDE 0.9 % IV SOLN: 250.0000 mL | INTRAVENOUS | Status: DC | PRN

## 2014-12-04 MED ORDER — LIDOCAINE HCL (PF) 1 % IJ SOLN
INTRAMUSCULAR | Status: AC
  Filled 2014-12-04: qty 30

## 2014-12-04 MED ORDER — SODIUM CHLORIDE 0.9 % IV SOLN
INTRAVENOUS | Status: DC
  Administered 2014-12-04: 1000 mL via INTRAVENOUS

## 2014-12-04 MED ORDER — ASPIRIN 81 MG PO CHEW: 81.0000 mg | CHEWABLE_TABLET | ORAL | Status: DC

## 2014-12-04 MED ORDER — BIVALIRUDIN 250 MG IV SOLR
INTRAVENOUS | Status: AC
  Filled 2014-12-04: qty 250

## 2014-12-04 MED ORDER — HEPARIN (PORCINE) IN NACL 2-0.9 UNIT/ML-% IJ SOLN
INTRAMUSCULAR | Status: AC
  Filled 2014-12-04: qty 1000

## 2014-12-04 MED ORDER — VERAPAMIL HCL 2.5 MG/ML IV SOLN
INTRAVENOUS | Status: AC
  Filled 2014-12-04: qty 2

## 2014-12-04 MED ORDER — HEPARIN SODIUM (PORCINE) 1000 UNIT/ML IJ SOLN
INTRAMUSCULAR | Status: AC
  Filled 2014-12-04: qty 1

## 2014-12-04 MED ORDER — TRAMADOL HCL 50 MG PO TABS
50.0000 mg | ORAL_TABLET | Freq: Three times a day (TID) | ORAL | Status: DC | PRN
Start: 2014-12-04 — End: 2014-12-05
  Administered 2014-12-04: 22:00:00 50 mg via ORAL

## 2014-12-04 MED ORDER — TICAGRELOR 90 MG PO TABS
90.0000 mg | ORAL_TABLET | Freq: Two times a day (BID) | ORAL | Status: DC
  Administered 2014-12-05: 02:00:00 90 mg via ORAL
  Filled 2014-12-04 (×2): qty 1

## 2014-12-04 NOTE — Progress Notes (Signed)
TR BAND REMOVAL  LOCATION:    right radial  DEFLATED PER PROTOCOL:    Yes.    TIME BAND OFF / DRESSING APPLIED:    1930   SITE UPON ARRIVAL:    Level 0  SITE AFTER BAND REMOVAL:    Level 0  REVERSE ALLEN'S TEST:     positive  CIRCULATION SENSATION AND MOVEMENT:    Within Normal Limits   Yes.    COMMENTS:   Tolerated procedure well

## 2014-12-04 NOTE — Progress Notes (Addendum)
VASCULAR LAB PRELIMINARY  PRELIMINARY  PRELIMINARY  PRELIMINARY  Carotid duplex completed.    Preliminary report:  Bilateral:  1-39% ICA stenosis.  Vertebral artery flow is antegrade.  Celine Ahr rule out a possible chronic thrombus of the internal jugular vein.   Kariah Loredo, RVS 12/04/2014, 12:12 PM

## 2014-12-04 NOTE — Consult Note (Signed)
Admit date: 12/03/2014 Referring Physician  Dr. Hartford Poli Primary Physician  Dr. Shawnie Dapper Primary Cardiologist  None Reason for Consultation  Chest pain and syncope  HPI: Brandon Haynes is a 74 y.o. male who presented to the ER with syncope. He states that he has been having dizziness for at least a year. He states that he has noted it is getting worse over time. He  had an episode yesterday with tightness in his chest. Patient states that he has noted this with activity as well.  He has noted an increased heart rate. He  felt like he was going to pass out yesterday and around 8 PM he actually did and fell to the ground. He says that he saw a cardiologist over 5 years ago for a stress test which was fine at that time. He has never had a heart cath done. His symptoms now have been getting worse over the last few months with the chest tightness.      PMH:   Past Medical History  Diagnosis Date  . Chest pain 2010    stress testing neg; GERD likely  . Hypertension   . Hypercholesteremia   . Hearing loss   . Allergic rhinitis     allergy shots via [place]  . Peripheral vascular disease   . GERD (gastroesophageal reflux disease)   . Hx of colonic polyps   . DDD (degenerative disc disease), lumbar   . Gout   . Chronic low back pain   . Anxiety   . History  of basal cell carcinoma   . Pleural thickening     chronic  . Osteoarthritis of multiple joints 10/18/2014     PSH:   Past Surgical History  Procedure Laterality Date  . Back injection  oct 2011, nov 2011    no surgery  . Shoulder injection  feb 2014  . Back injection  01/11/2013  . Rotator cuff repair  09/2013    left  . Cardiovascular stress test  05/2009    negative/normal  . Colonoscopy  08/2008    repeat 7 yrs    Allergies:  Review of patient's allergies indicates no known allergies. Prior to Admit Meds:   Prescriptions prior to admission  Medication Sig Dispense Refill Last Dose  . allopurinol (ZYLOPRIM) 300 MG  tablet Take 1 tablet by mouth  daily 90 tablet 1 12/02/2014 at Unknown time  . aspirin 81 MG tablet Take 81 mg by mouth daily.     12/03/2014 at Unknown time  . atenolol (TENORMIN) 50 MG tablet 1/2 tab po qd 90 tablet 1 12/03/2014 at 400 pm  . CRESTOR 20 MG tablet Take 1 tablet by mouth  daily 90 tablet 1 12/02/2014 at Unknown time  . fluticasone (FLONASE) 50 MCG/ACT nasal spray Place 1 spray into both nostrils daily.   12/03/2014 at Unknown time  . gabapentin (NEURONTIN) 300 MG capsule Take 300 mg by mouth 4 (four) times daily.    12/03/2014 at Unknown time  . hydrochlorothiazide (HYDRODIURIL) 25 MG tablet Take 1 tablet by mouth  daily 90 tablet 1 12/03/2014 at Unknown time  . loratadine (CLARITIN) 10 MG tablet Take 10 mg by mouth daily. As needed    12/03/2014 at Unknown time  . Olopatadine HCl 0.6 % SOLN Place into the nose 2 (two) times daily.   12/02/2014 at Unknown time  . omeprazole (PRILOSEC) 20 MG capsule TAKE 1 CAPSULE BY MOUTH TWICE DAILY 30 MINUTES BEFORE BREAKFAST AND DINNER  180 capsule 2 12/03/2014 at Unknown time  . sildenafil (VIAGRA) 100 MG tablet Take 1 tablet (100 mg total) by mouth as needed for erectile dysfunction. 10 tablet 6 unknown  . traMADol (ULTRAM) 50 MG tablet TAKE 1 TABLET BY MOUTH THREE TIMES DAILY AS NEEDED FOR FOR PAIN 90 tablet 5 unknown  . valsartan (DIOVAN) 320 MG tablet Take 1 tablet by mouth  daily 90 tablet 1 12/03/2014 at Unknown time   Fam HX:   No family history on file. Social HX:    History   Social History  . Marital Status: Married    Spouse Name: Vaughan Basta  . Number of Children: 3  . Years of Education: N/A   Occupational History  . retired    Social History Main Topics  . Smoking status: Former Smoker -- 2.00 packs/day for 35 years    Types: Cigarettes    Quit date: 09/28/1998  . Smokeless tobacco: Former Systems developer     Comment: as a teenager  . Alcohol Use: No     Comment: quit 1994  . Drug Use: No  . Sexual Activity: Not on file   Other Topics Concern  . Not  on file   Social History Narrative   Married, 3 children.   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   Orig from [place], lived [place] same farm all his life.   Former smoker: quit approx around [place].  80 pack-yr hx.   Alcohol-none, used to drink a lot of beer on weekends.  No drugs.   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   Diet: fair     ROS:  All 11 ROS were addressed and are negative except what is stated in the HPI  Physical Exam: Blood pressure 118/72, pulse 65, temperature 97.7 F (36.5 C), temperature source Oral, resp. rate 16, height 6\' 2"  (1.88 m), weight 216 lb 8 oz (98.204 kg), SpO2 96 %.    General: Well developed, well nourished, in no acute distress Head: Eyes PERRLA, No xanthomas.   Normal cephalic and atramatic  Lungs:   Clear bilaterally to auscultation and percussion. Heart:   HRRR S1 S2 Pulses are 2+ & equal.            No carotid bruit. No JVD.  No abdominal bruits. No femoral bruits. Abdomen: Bowel sounds are positive, abdomen soft and non-tender without masses Extremities:   No clubbing, cyanosis or edema.  DP +1 Neuro: Alert and oriented X 3. Psych:  Good affect, responds appropriately    Labs:   Lab Results  Component Value Date   WBC 9.5 12/03/2014   HGB 13.5 12/03/2014   HCT 39.6 12/03/2014   MCV 83.5 12/03/2014   PLT 223 12/03/2014    Recent Labs Lab 12/03/14 1827  NA 134*  K 3.4*  CL 101  CO2 25  BUN 9  CREATININE 1.16  CALCIUM 9.0  GLUCOSE 189*   No results found for: PTT No results found for: INR, PROTIME Lab Results  Component Value Date   TROPONINI 0.03 12/04/2014     Lab Results  Component Value Date   CHOL 155 03/22/2014   CHOL 136 02/21/2013   CHOL 135 02/25/2012   Lab Results  Component Value Date   HDL 57.80 03/22/2014   HDL 57.10 02/21/2013   HDL 61.40 02/25/2012   Lab Results  Component Value Date   LDLCALC 77 03/22/2014   LDLCALC 62 02/21/2013   LDLCALC 57 02/25/2012  Lab  Results  Component Value Date   TRIG 103.0 03/22/2014   TRIG 84.0 02/21/2013   TRIG 81.0 02/25/2012   Lab Results  Component Value Date   CHOLHDL 3 03/22/2014   CHOLHDL 2 02/21/2013   CHOLHDL 2 02/25/2012   No results found for: LDLDIRECT    Radiology:  Dg Chest 2 View  12/03/2014   CLINICAL DATA:  Intermittent dizziness and chest tightness for 1 month. Patient passed out on Sunday night. No injury. Shortness of breath and leg tingling. Previous smoker.  EXAM: CHEST  2 VIEW  COMPARISON:  02/21/2013  FINDINGS: Normal heart size and pulmonary vascularity. No focal airspace disease or consolidation in the lungs. No blunting of costophrenic angles. No pneumothorax. Mediastinal contours appear intact. Calcified granuloma in the left lung base. Calcified aorta.  IMPRESSION: No active cardiopulmonary disease.   Electronically Signed   By: Lucienne Capers M.D.   On: 12/03/2014 18:46    EKG:  NSR with no ST changes  ASSESSMENT/PLAN:  1.  Unstable angina - he gives classic symptoms of accelerating angina.  His symptoms of exertional chest pain have been occurring for over a year and now occur with minimal exertion.  EKG is non ischemic and cardiac enzymes are normal.  He had evidence of coronary artery calcifications on chest CT in 2006.  He gets very weak and dizzy and then had chest pressure with SOB with his episodes.  He has a 35 year history of smoking 1.5 ppd.  I suspect he has significant underlying CAD and possible 3 vessel and therefore nuclear stress test could be normal in setting of 3 vessel disease.  I have recommended that we proceed with cardiac catheterization.  Cardiac catheterization was discussed with the patient fully including risks on myocardial infarction, death, stroke, bleeding, arrhythmia, dye allergy, renal insufficiency or bleeding.  All patient questions and concerns were discussed and the patient understands and is willing to proceed.   2.  Syncope - possible related to  coronary ischemia with transient LV dysfunction and hypotension but could also be due to orthostatic hypotension since he does not drink much when he is outside working. 3.  HTN with soft BP - BP meds now on hold    Sueanne Margarita, MD  12/04/2014   9:22 AM

## 2014-12-04 NOTE — CV Procedure (Signed)
Cardiac Cath Procedure Note:  Indication: [place]  Procedures performed:  1) Selective coronary angiography 2) Left heart catheterization 3) Left ventriculogram  Description of procedure:   The risks and indication of the procedure were explained. Consent was signed and placed on the chart. An appropriate timeout was taken prior to the procedure. After a normal Allen's test was confirmed, the right wrist was prepped and draped in the routine sterile fashion and anesthetized with 1% local lidocaine.   A 5 FR arterial sheath was then placed in the right radial artery using a modified Seldinger technique. Systemic heparin was administered. 3mg  IV verapamil was given through the sheath. Standard catheters including a JL 3.5, JR4 and straight pigtail were used. All catheter exchanges were made over a wire.  Complications:  None apparent  Findings:  Ao Pressure: 105/72 (88) LV Pressure:  107/22 There was no signficant gradient across the aortic valve on pullback.  Left main: Large. Normal.   LAD: Very large vessel wrapping apex. Gives off 2 diagonals. Apical LAD bifurcates into a "whale tail". Mild plaque throughout. In the mid vessel there is a 99% stenosis immediately followed by an aneurysmal segment. There is a 40% stenosis after this.   LCX: Large non-dominant vessel. Gives off 2 small Ramus branches. Very large OM-1.  40% stenosis in mid OM-1 with myocardial bridging segment. Distal AV groove LCX with 40-50% stenosis with robust L -> R collaterals. Small PL.   RCA: Dominant vessel. Mild to moderate plaque throughout. 30-40% proximal lesion. 30% mid. 50% hazy lesion around distal bend. The RCA is occluded after the take-off of a large RV marginal. The distal RCA system fills well from L-> R collaterals.   LV-gram done in the RAO projection: Ejection fraction = 55% no regional wall motion abnormalities  Assessment: 1. 2v CAD as described above 2. Normal EF  Plan/Discussion:  PCI LAD  with Dr. [place]. The distal RCA is totally occluded but well collateralized from the left system. Will need aggressive risk factor management.   Glori Bickers MD 2:48 PM

## 2014-12-04 NOTE — Progress Notes (Signed)
UR completed 

## 2014-12-04 NOTE — H&P (View-Only) (Signed)
Admit date: 12/03/2014 Referring Physician  Dr. Hartford Poli Primary Physician  Dr. Shawnie Dapper Primary Cardiologist  None Reason for Consultation  Chest pain and syncope  HPI: Brandon Haynes is a 74 y.o. male who presented to the ER with syncope. He states that he has been having dizziness for at least a year. He states that he has noted it is getting worse over time. He  had an episode yesterday with tightness in his chest. Patient states that he has noted this with activity as well.  He has noted an increased heart rate. He  felt like he was going to pass out yesterday and around 8 PM he actually did and fell to the ground. He says that he saw a cardiologist over 5 years ago for a stress test which was fine at that time. He has never had a heart cath done. His symptoms now have been getting worse over the last few months with the chest tightness.      PMH:   Past Medical History  Diagnosis Date  . Chest pain 2010    stress testing neg; GERD likely  . Hypertension   . Hypercholesteremia   . Hearing loss   . Allergic rhinitis     allergy shots via [place]  . Peripheral vascular disease   . GERD (gastroesophageal reflux disease)   . Hx of colonic polyps   . DDD (degenerative disc disease), lumbar   . Gout   . Chronic low back pain   . Anxiety   . History  of basal cell carcinoma   . Pleural thickening     chronic  . Osteoarthritis of multiple joints 10/18/2014     PSH:   Past Surgical History  Procedure Laterality Date  . Back injection  oct 2011, nov 2011    no surgery  . Shoulder injection  feb 2014  . Back injection  01/11/2013  . Rotator cuff repair  09/2013    left  . Cardiovascular stress test  05/2009    negative/normal  . Colonoscopy  08/2008    repeat 7 yrs    Allergies:  Review of patient's allergies indicates no known allergies. Prior to Admit Meds:   Prescriptions prior to admission  Medication Sig Dispense Refill Last Dose  . allopurinol (ZYLOPRIM) 300 MG  tablet Take 1 tablet by mouth  daily 90 tablet 1 12/02/2014 at Unknown time  . aspirin 81 MG tablet Take 81 mg by mouth daily.     12/03/2014 at Unknown time  . atenolol (TENORMIN) 50 MG tablet 1/2 tab po qd 90 tablet 1 12/03/2014 at 400 pm  . CRESTOR 20 MG tablet Take 1 tablet by mouth  daily 90 tablet 1 12/02/2014 at Unknown time  . fluticasone (FLONASE) 50 MCG/ACT nasal spray Place 1 spray into both nostrils daily.   12/03/2014 at Unknown time  . gabapentin (NEURONTIN) 300 MG capsule Take 300 mg by mouth 4 (four) times daily.    12/03/2014 at Unknown time  . hydrochlorothiazide (HYDRODIURIL) 25 MG tablet Take 1 tablet by mouth  daily 90 tablet 1 12/03/2014 at Unknown time  . loratadine (CLARITIN) 10 MG tablet Take 10 mg by mouth daily. As needed    12/03/2014 at Unknown time  . Olopatadine HCl 0.6 % SOLN Place into the nose 2 (two) times daily.   12/02/2014 at Unknown time  . omeprazole (PRILOSEC) 20 MG capsule TAKE 1 CAPSULE BY MOUTH TWICE DAILY 30 MINUTES BEFORE BREAKFAST AND DINNER  180 capsule 2 12/03/2014 at Unknown time  . sildenafil (VIAGRA) 100 MG tablet Take 1 tablet (100 mg total) by mouth as needed for erectile dysfunction. 10 tablet 6 unknown  . traMADol (ULTRAM) 50 MG tablet TAKE 1 TABLET BY MOUTH THREE TIMES DAILY AS NEEDED FOR FOR PAIN 90 tablet 5 unknown  . valsartan (DIOVAN) 320 MG tablet Take 1 tablet by mouth  daily 90 tablet 1 12/03/2014 at Unknown time   Fam HX:   No family history on file. Social HX:    History   Social History  . Marital Status: Married    Spouse Name: Vaughan Basta  . Number of Children: 3  . Years of Education: N/A   Occupational History  . retired    Social History Main Topics  . Smoking status: Former Smoker -- 2.00 packs/day for 35 years    Types: Cigarettes    Quit date: 09/28/1998  . Smokeless tobacco: Former Systems developer     Comment: as a teenager  . Alcohol Use: No     Comment: quit 1994  . Drug Use: No  . Sexual Activity: Not on file   Other Topics Concern  . Not  on file   Social History Narrative   Married, 3 children.   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   Former smoker: quit approx around [place].  80 pack-yr hx.   Alcohol-none, used to drink a lot of beer on weekends.  No drugs.   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   Diet: fair     ROS:  All 11 ROS were addressed and are negative except what is stated in the HPI  Physical Exam: Blood pressure 118/72, pulse 65, temperature 97.7 F (36.5 C), temperature source Oral, resp. rate 16, height 6\' 2"  (1.88 m), weight 216 lb 8 oz (98.204 kg), SpO2 96 %.    General: Well developed, well nourished, in no acute distress Head: Eyes PERRLA, No xanthomas.   Normal cephalic and atramatic  Lungs:   Clear bilaterally to auscultation and percussion. Heart:   HRRR S1 S2 Pulses are 2+ & equal.            No carotid bruit. No JVD.  No abdominal bruits. No femoral bruits. Abdomen: Bowel sounds are positive, abdomen soft and non-tender without masses Extremities:   No clubbing, cyanosis or edema.  DP +1 Neuro: Alert and oriented X 3. Psych:  Good affect, responds appropriately    Labs:   Lab Results  Component Value Date   WBC 9.5 12/03/2014   HGB 13.5 12/03/2014   HCT 39.6 12/03/2014   MCV 83.5 12/03/2014   PLT 223 12/03/2014    Recent Labs Lab 12/03/14 1827  NA 134*  K 3.4*  CL 101  CO2 25  BUN 9  CREATININE 1.16  CALCIUM 9.0  GLUCOSE 189*   No results found for: PTT No results found for: INR, PROTIME Lab Results  Component Value Date   TROPONINI 0.03 12/04/2014     Lab Results  Component Value Date   CHOL 155 03/22/2014   CHOL 136 02/21/2013   CHOL 135 02/25/2012   Lab Results  Component Value Date   HDL 57.80 03/22/2014   HDL 57.10 02/21/2013   HDL 61.40 02/25/2012   Lab Results  Component Value Date   LDLCALC 77 03/22/2014   LDLCALC 62 02/21/2013   LDLCALC 57 02/25/2012  Lab  Results  Component Value Date   TRIG 103.0 03/22/2014   TRIG 84.0 02/21/2013   TRIG 81.0 02/25/2012   Lab Results  Component Value Date   CHOLHDL 3 03/22/2014   CHOLHDL 2 02/21/2013   CHOLHDL 2 02/25/2012   No results found for: LDLDIRECT    Radiology:  Dg Chest 2 View  12/03/2014   CLINICAL DATA:  Intermittent dizziness and chest tightness for 1 month. Patient passed out on Sunday night. No injury. Shortness of breath and leg tingling. Previous smoker.  EXAM: CHEST  2 VIEW  COMPARISON:  02/21/2013  FINDINGS: Normal heart size and pulmonary vascularity. No focal airspace disease or consolidation in the lungs. No blunting of costophrenic angles. No pneumothorax. Mediastinal contours appear intact. Calcified granuloma in the left lung base. Calcified aorta.  IMPRESSION: No active cardiopulmonary disease.   Electronically Signed   By: Lucienne Capers M.D.   On: 12/03/2014 18:46    EKG:  NSR with no ST changes  ASSESSMENT/PLAN:  1.  Unstable angina - he gives classic symptoms of accelerating angina.  His symptoms of exertional chest pain have been occurring for over a year and now occur with minimal exertion.  EKG is non ischemic and cardiac enzymes are normal.  He had evidence of coronary artery calcifications on chest CT in 2006.  He gets very weak and dizzy and then had chest pressure with SOB with his episodes.  He has a 35 year history of smoking 1.5 ppd.  I suspect he has significant underlying CAD and possible 3 vessel and therefore nuclear stress test could be normal in setting of 3 vessel disease.  I have recommended that we proceed with cardiac catheterization.  Cardiac catheterization was discussed with the patient fully including risks on myocardial infarction, death, stroke, bleeding, arrhythmia, dye allergy, renal insufficiency or bleeding.  All patient questions and concerns were discussed and the patient understands and is willing to proceed.   2.  Syncope - possible related to  coronary ischemia with transient LV dysfunction and hypotension but could also be due to orthostatic hypotension since he does not drink much when he is outside working. 3.  HTN with soft BP - BP meds now on hold    Sueanne Margarita, MD  12/04/2014   9:22 AM

## 2014-12-04 NOTE — Progress Notes (Signed)
  Echocardiogram 2D Echocardiogram has been performed.  Brandon Haynes 12/04/2014, 10:30 AM

## 2014-12-04 NOTE — CV Procedure (Signed)
    CARDIAC CATH NOTE  Name: Brandon Haynes MRN: [id number] DOB: March 07, 1941  Procedure: PTCA and stenting of the mid LAD  Indication: 74 yo WM presents with unstable angina. Diagnostic cardiac cath showed a 99% stenosis in the mid LAD. The distal RCA was occluded with good left to right collaterals. PCI of the LAD was recommended.  Procedural Details: The right wrist radial access was used for PCI.  Weight-based bivalirudin was given for anticoagulation. Brilinta 180 mg was given orally.  Once a therapeutic ACT was achieved, a 6 [place] XBLAD 3.5  guide catheter was inserted.  A prowater coronary guidewire was used to cross the lesion.  The lesion was predilated with a 1.5 mm then 2.5 mm balloon.  The lesion was then stented with a 4.0 x 12 mm Synergy stent.  The stent was postdilated with a 4.5 mm noncompliant balloon.  Following PCI, there was 0% residual stenosis and TIMI-3 flow. Final angiography confirmed an excellent result. The patient tolerated the procedure well. There were no immediate procedural complications. A TR band was used for radial hemostasis. The patient was transferred to the post catheterization recovery area for further monitoring.  Lesion Data: Vessel: LAD Percent stenosis (pre): 99% TIMI-flow (pre):  3 Stent:  4.0 x 12 mm Synergy stent Percent stenosis (post): 0% TIMI-flow (post): 3  Conclusions: Successful stenting of the mid LAD with DES.  Recommendations: DAPT for one year. Will treat residual disease in the RCA medically. Anticipate DC in am.   Makaylen Thieme [place], [place] 12/04/2014, 3:40 PM

## 2014-12-04 NOTE — Progress Notes (Signed)
[facility] PROGRESS NOTE   Brandon Haynes NID:[id number] DOB: March 20, 1941 DOA: 12/03/2014 PCP: Tammi Sou, MD  HPI/Subjective: Seen with wife at bedside, denies any chest pain or shortness of breath.  Assessment/Plan: Principal Problem:   Chest tightness Active Problems:   HYPERCHOLESTEROLEMIA   Essential hypertension   Syncope   Faintness   Chest pain -Had episodic chest pain for the past year, pain very typical comes with exertion and relieved by rest. -12-lead EKG and 3 sets of troponin negative for ischemia. -Seen by cardiology. -Patient for cardiac catheterization per cardiology recommendations.  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  Hyperlipidemia -continue with statins  HTN -hold antihypertensives for now  Hypokalemia -Will give one dose of Klor  Hyperglycemia -Will check A1C -Monitor FSBS  Code Status: Full Code Family Communication: Plan discussed with the patient. Disposition Plan: Remains inpatient Diet: Diet NPO time specified  Consultants:  Cardiology  Procedures:  None  Antibiotics:  None   Objective: Filed Vitals:   12/04/14 0755  BP: 118/72  Pulse: 65  Temp: 97.7 F (36.5 C)  Resp: 16    Intake/Output Summary (Last 24 hours) at 12/04/14 1231 Last data filed at 12/04/14 1000  Gross per 24 hour  Intake    910 ml  Output   1100 ml  Net   -190 ml   Filed Weights   12/03/14 1818 12/03/14 2135  Weight: 95.879 kg (211 lb 6 oz) 98.204 kg (216 lb 8 oz)    Exam: General: Alert and awake, oriented x3, not in any acute distress. HEENT: anicteric sclera, pupils reactive to light and accommodation, EOMI CVS: S1-S2 clear, no murmur rubs or gallops Chest: clear to auscultation bilaterally, no wheezing, rales or rhonchi Abdomen: soft nontender,  nondistended, normal bowel sounds, no organomegaly Extremities: no cyanosis, clubbing or edema noted bilaterally Neuro: Cranial nerves II-XII intact, no focal neurological deficits  Data Reviewed: Basic Metabolic Panel:  Recent Labs Lab 12/03/14 1827  NA 134*  K 3.4*  CL 101  CO2 25  GLUCOSE 189*  BUN 9  CREATININE 1.16  CALCIUM 9.0   Liver Function Tests: No results for input(s): AST, ALT, ALKPHOS, BILITOT, PROT, ALBUMIN in the last 168 hours. No results for input(s): LIPASE, AMYLASE in the last 168 hours. No results for input(s): AMMONIA in the last 168 hours. CBC:  Recent Labs Lab 12/03/14 1827  WBC 9.5  HGB 13.5  HCT 39.6  MCV 83.5  PLT 223   Cardiac Enzymes:  Recent Labs Lab 12/03/14 2235 12/04/14 0140 12/04/14 0331  TROPONINI 0.03 <0.03 0.03   BNP (last 3 results) No results for input(s): BNP in the last 8760 hours.  ProBNP (last 3 results) No results for input(s): PROBNP in the last 8760 hours.  CBG:  Recent Labs Lab 12/04/14 0006 12/04/14 0724 12/04/14 1142  GLUCAP 105* 89 85    Micro No results found for this or any previous visit (from the past 240 hour(s)).   Studies: Dg Chest 2 View  12/03/2014   CLINICAL DATA:  Intermittent dizziness and chest tightness for 1 month. Patient passed out on Sunday night. No injury. Shortness of breath and leg tingling. Previous smoker.  EXAM: CHEST  2 VIEW  COMPARISON:  02/21/2013  FINDINGS: Normal heart size and pulmonary vascularity. No focal airspace disease or consolidation in the lungs. No blunting of  costophrenic angles. No pneumothorax. Mediastinal contours appear intact. Calcified granuloma in the left lung base. Calcified aorta.  IMPRESSION: No active cardiopulmonary disease.   Electronically Signed   By: Lucienne Capers M.D.   On: 12/03/2014 18:46    Scheduled Meds: . allopurinol  300 mg Oral Daily  . aspirin EC  81 mg Oral Daily  . azelastine  1 spray Each Nare BID  . fluticasone  1 spray  Each Nare Daily  . gabapentin  300 mg Oral QID  . heparin  5,000 Units Subcutaneous 3 times per day  . insulin aspart  0-15 Units Subcutaneous TID WC  . loratadine  10 mg Oral Daily  . pantoprazole  40 mg Oral Daily  . rosuvastatin  20 mg Oral Daily  . sodium chloride  3 mL Intravenous Q12H   Continuous Infusions: . sodium chloride 75 mL/hr (12/03/14 2240)  . sodium chloride 1,000 mL (12/04/14 1156)       Time spent: 35 minutes    [facility] A  [facility] Pager [phone] If 7PM-7AM, please contact night-coverage at [email or web address], password [facility] 12/04/2014, 12:31 PM

## 2014-12-04 NOTE — Interval H&P Note (Signed)
History and Physical Interval Note:  12/04/2014 2:18 PM  Brandon Haynes  has presented today for surgery, with the diagnosis of unstable angina  The various methods of treatment have been discussed with the patient and family. After consideration of risks, benefits and other options for treatment, the patient has consented to  Procedure(s): LEFT HEART CATHETERIZATION WITH CORONARY ANGIOGRAM (N/A) and possible angioplasty as a surgical intervention .  The patient's history has been reviewed, patient examined, no change in status, stable for surgery.  I have reviewed the patient's chart and labs.  Questions were answered to the patient's satisfaction.     [name]

## 2014-12-05 ENCOUNTER — Encounter (HOSPITAL_COMMUNITY): Payer: Self-pay | Admitting: Internal Medicine

## 2014-12-05 DIAGNOSIS — I200 Unstable angina: Secondary | ICD-10-CM

## 2014-12-05 DIAGNOSIS — I25119 Atherosclerotic heart disease of native coronary artery with unspecified angina pectoris: Secondary | ICD-10-CM

## 2014-12-05 DIAGNOSIS — I2510 Atherosclerotic heart disease of native coronary artery without angina pectoris: Secondary | ICD-10-CM | POA: Insufficient documentation

## 2014-12-05 LAB — HEMOGLOBIN A1C
Hgb A1c MFr Bld: 5.7 % — ABNORMAL HIGH (ref 4.8–5.6)
MEAN PLASMA GLUCOSE: 117 mg/dL

## 2014-12-05 LAB — BASIC METABOLIC PANEL WITH GFR
Anion gap: 7 (ref 5–15)
BUN: 5 mg/dL — ABNORMAL LOW (ref 6–23)
CO2: 26 mmol/L (ref 19–32)
Calcium: 9.2 mg/dL (ref 8.4–10.5)
Chloride: 109 mmol/L (ref 96–112)
Creatinine, Ser: 0.93 mg/dL (ref 0.50–1.35)
GFR calc Af Amer: >90 mL/min
GFR calc non Af Amer: 83 mL/min — ABNORMAL LOW
Glucose, Bld: 80 mg/dL (ref 70–99)
Potassium: 4.2 mmol/L (ref 3.5–5.1)
Sodium: 142 mmol/L (ref 135–145)

## 2014-12-05 LAB — CBC
HCT: 36.5 % — ABNORMAL LOW (ref 39.0–52.0)
Hemoglobin: 12.1 g/dL — ABNORMAL LOW (ref 13.0–17.0)
MCH: 27.8 pg (ref 26.0–34.0)
MCHC: 33.2 g/dL (ref 30.0–36.0)
MCV: 83.7 fL (ref 78.0–100.0)
Platelets: 179 K/uL (ref 150–400)
RBC: 4.36 MIL/uL (ref 4.22–5.81)
RDW: 15.4 % (ref 11.5–15.5)
WBC: 6.1 K/uL (ref 4.0–10.5)

## 2014-12-05 MED ORDER — NITROGLYCERIN 0.4 MG SL SUBL: 0.4000 mg | SUBLINGUAL_TABLET | SUBLINGUAL | Status: DC | PRN

## 2014-12-05 MED ORDER — TICAGRELOR 90 MG PO TABS: 90.0000 mg | ORAL_TABLET | Freq: Two times a day (BID) | ORAL | Status: DC

## 2014-12-05 MED ORDER — VALSARTAN 160 MG PO TABS: 160.0000 mg | ORAL_TABLET | Freq: Every day | ORAL | Status: DC

## 2014-12-05 MED ORDER — METOPROLOL TARTRATE 25 MG PO TABS: 25.0000 mg | ORAL_TABLET | Freq: Two times a day (BID) | ORAL | Status: DC

## 2014-12-05 MED FILL — Sodium Chloride IV Soln 0.9%: INTRAVENOUS | Qty: 50 | Status: AC

## 2014-12-05 NOTE — Discharge Summary (Signed)
Physician Discharge Summary  Brandon Haynes VQQ:[id number] DOB: [birth year] DOA: 12/03/2014  PCP: Tammi Sou, MD  Admit date: 12/03/2014 Discharge date: 12/05/2014  Recommendations for Outpatient Follow-up:  1. Check CBC and BMP during next appt with PCP 2. Follow up with PCP per sch appt and cardio per sch appt   Discharge Diagnoses:  Principal Problem:   Unstable angina Active Problems:   HYPERCHOLESTEROLEMIA   Essential hypertension   Syncope    Discharge Condition: stable   Diet recommendation: as tolerated   History of present illness:  74 y.o. male with past medical history of hypertension who presented to ED with ongoing dizziness for quite some time and getting worse lately with associated chest tightness in past 24 hours prior to this admission worse with exertion and relieved with rest.  Assessment and Plan:  Chest pain / unstable angina - Patient has had episodic chest pain for the past year, pain very typical comes with exertion and relieved by rest. - 12-lead EKG no signs of acute ischemic changes and 3 sets of troponin negative for ischemia. - Cardio has seen the pt in consultation. 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  - Continue dual antiplatelet therapy for at least 1 year.  - Referral made to cardiac rehab - Continue brilinta   Syncope  - Possibly related to coronary ischemia worsened by hypotension.  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 - feels better this am and ok per cardio that pt is discharged   Essential hypertension - Continue hctz, valsartan, metoprolol   Hyperlipidemia - Continue with statins  Hypokalemia - Supplemented - Secondary to Hctz  Code Status: Full Code Family Communication: Plan discussed with the  patient.   Consultants:  Cardiology  Antibiotics:  None   Signed:  Leisa Lenz, MD  [facility] 12/05/2014, 7:45 AM  Pager #: [phone]  Discharge Exam: Filed Vitals:   12/05/14 0611  BP: 157/94  Pulse: 78  Temp: 97.4 F (36.3 C)  Resp: 20   Filed Vitals:   12/04/14 2022 12/04/14 2100 12/05/14 0020 12/05/14 0611  BP: 131/79 126/76 134/75 157/94  Pulse: 76 69 79 78  Temp: 97.6 F (36.4 C)  97.5 F (36.4 C) 97.4 F (36.3 C)  TempSrc: Oral  Oral Oral  Resp: 11 9 24 20   Height:      Weight:   98.8 kg (217 lb 13 oz)   SpO2: 99% 97% 94% 97%    General: Pt is alert, follows commands appropriately, not in acute distress Cardiovascular: Regular rate and rhythm, S1/S2 +, no murmurs Respiratory: Clear to auscultation bilaterally, no wheezing, no crackles, no rhonchi Abdominal: Soft, non tender, non distended, bowel sounds +, no guarding Extremities: no edema, no cyanosis, pulses palpable bilaterally DP and PT Neuro: Grossly nonfocal  Discharge Instructions  Discharge Instructions    Call MD for:  difficulty breathing, headache or visual disturbances    Complete by:  As directed      Call MD for:  persistant dizziness or light-headedness    Complete by:  As directed      Call MD for:  persistant nausea and vomiting    Complete by:  As directed      Call MD for:  severe uncontrolled pain    Complete by:  As directed  Diet - low sodium heart healthy    Complete by:  As directed      Increase activity slowly    Complete by:  As directed      Increase activity slowly    Complete by:  As directed             Medication List    STOP taking these medications        atenolol 50 MG tablet  Commonly known as:  TENORMIN      TAKE these medications        allopurinol 300 MG tablet  Commonly known as:  ZYLOPRIM  Take 1 tablet by mouth  daily     aspirin 81 MG tablet  Take 81 mg by mouth daily.     CRESTOR 20 MG tablet  Generic drug:   rosuvastatin  Take 1 tablet by mouth  daily     fluticasone 50 MCG/ACT nasal spray  Commonly known as:  FLONASE  Place 1 spray into both nostrils daily.     gabapentin 300 MG capsule  Commonly known as:  NEURONTIN  Take 300 mg by mouth 4 (four) times daily.     hydrochlorothiazide 25 MG tablet  Commonly known as:  HYDRODIURIL  Take 1 tablet by mouth  daily     loratadine 10 MG tablet  Commonly known as:  CLARITIN  Take 10 mg by mouth daily. As needed     metoprolol tartrate 25 MG tablet  Commonly known as:  LOPRESSOR  Take 1 tablet (25 mg total) by mouth 2 (two) times daily.     nitroGLYCERIN 0.4 MG SL tablet  Commonly known as:  NITROSTAT  Place 1 tablet (0.4 mg total) under the tongue every 5 (five) minutes as needed for chest pain.     Olopatadine HCl 0.6 % Soln  Place into the nose 2 (two) times daily.     omeprazole 20 MG capsule  Commonly known as:  PRILOSEC  TAKE 1 CAPSULE BY MOUTH TWICE DAILY 30 MINUTES BEFORE BREAKFAST AND DINNER     sildenafil 100 MG tablet  Commonly known as:  VIAGRA  Take 1 tablet (100 mg total) by mouth as needed for erectile dysfunction.     ticagrelor 90 MG Tabs tablet  Commonly known as:  BRILINTA  Take 1 tablet (90 mg total) by mouth 2 (two) times daily.     traMADol 50 MG tablet  Commonly known as:  ULTRAM  TAKE 1 TABLET BY MOUTH THREE TIMES DAILY AS NEEDED FOR FOR PAIN     valsartan 160 MG tablet  Commonly known as:  DIOVAN  Take 1 tablet (160 mg total) by mouth daily.           Follow-up Information    Follow up with Sueanne Margarita, MD.   Specialty:  Cardiology   Contact information:   [address] [place] [address] [phone]       Follow up with Tammi Sou, MD. Schedule an appointment as soon as possible for a visit in 2 weeks.   Specialty:  Family Medicine   Why:  Follow up appt after recent hospitalization   Contact information:   [address] [place]   [phone]        The results of significant diagnostics from this hospitalization (including imaging, microbiology, ancillary and laboratory) are listed below for reference.    Significant Diagnostic Studies: Dg Chest 2 View 12/03/2014  No active cardiopulmonary disease.   Electronically Signed   By: Lucienne Capers M.D.   On: 12/03/2014 18:46    Microbiology: No results found for this or any previous visit (from the past 240 hour(s)).   Labs: Basic Metabolic Panel:  Recent Labs Lab 12/03/14 1827 12/04/14 1849  NA 134* 137  K 3.4* 3.6  CL 101 109  CO2 25 25  GLUCOSE 189* 128*  BUN 9 6  CREATININE 1.16 0.94  CALCIUM 9.0 8.6   Liver Function Tests: No results for input(s): AST, ALT, ALKPHOS, BILITOT, PROT, ALBUMIN in the last 168 hours. No results for input(s): LIPASE, AMYLASE in the last 168 hours. No results for input(s): AMMONIA in the last 168 hours. CBC:  Recent Labs Lab 12/03/14 1827 12/04/14 1849 12/05/14 0501  WBC 9.5 4.8 6.1  HGB 13.5 12.3* 12.1*  HCT 39.6 36.4* 36.5*  MCV 83.5 83.1 83.7  PLT 223 177 179   Cardiac Enzymes:  Recent Labs Lab 12/03/14 2235 12/04/14 0140 12/04/14 0331  TROPONINI 0.03 <0.03 0.03   BNP: BNP (last 3 results) No results for input(s): BNP in the last 8760 hours.  ProBNP (last 3 results) No results for input(s): PROBNP in the last 8760 hours.  CBG:  Recent Labs Lab 12/04/14 0006 12/04/14 0724 12/04/14 1142 12/04/14 1811  GLUCAP 105* 89 85 123*    Time coordinating discharge: Over 30 minutes

## 2014-12-05 NOTE — Discharge Instructions (Addendum)
Coronary Angiogram °A coronary angiogram, also called coronary angiography, is an X-ray procedure used to look at the arteries in the heart. In this procedure, a dye (contrast dye) is injected through a long, hollow tube (catheter). The catheter is about the size of a piece of cooked spaghetti and is inserted through your groin, wrist, or arm. The dye is injected into each artery, and X-rays are then taken to show if there is a blockage in the arteries of your heart. °LET YOUR HEALTH CARE PROVIDER KNOW ABOUT: °· Any allergies you have, including allergies to shellfish or contrast dye.   °· All medicines you are taking, including vitamins, herbs, eye drops, creams, and over-the-counter medicines.   °· Previous problems you or members of your family have had with the use of anesthetics.   °· Any blood disorders you have.   °· Previous surgeries you have had. °· History of kidney problems or failure.   °· Other medical conditions you have. °RISKS AND COMPLICATIONS  °Generally, a coronary angiogram is a safe procedure. However, problems can occur and include: °· Allergic reaction to the dye. °· Bleeding from the access site or other locations. °· Kidney injury, especially in people with impaired kidney function.  °· Stroke (rare). °· Heart attack (rare). °BEFORE THE PROCEDURE  °· Do not eat or drink anything after midnight the night before the procedure or as directed by your health care provider.   °· Ask your health care provider about changing or stopping your regular medicines. This is especially important if you are taking diabetes medicines or blood thinners. °PROCEDURE °· You may be given a medicine to help you relax (sedative) before the procedure. This medicine is given through an intravenous (IV) access tube that is inserted into one of your veins.   °· The area where the catheter will be inserted will be washed and shaved. This is usually done in the groin but may be done in the fold of your arm (near your  elbow) or in the wrist.    °· A medicine will be given to numb the area where the catheter will be inserted (local anesthetic).   °· The health care provider will insert the catheter into an artery. The catheter will be guided by using a special type of X-ray (fluoroscopy) of the blood vessel being examined.   °· A special dye will then be injected into the catheter, and X-rays will be taken. The dye will help to show where any narrowing or blockages are located in the heart arteries.   °AFTER THE PROCEDURE  °· If the procedure is done through the leg, you will be kept in bed lying flat for several hours. You will be instructed to not bend or cross your legs. °· The insertion site will be checked frequently.   °· The pulse in your feet or wrist will be checked frequently.   °· Additional blood tests, X-rays, and an electrocardiogram may be done.   °Document Released: 03/21/2003 Document Revised: 01/29/2014 Document Reviewed: 02/06/2013 °[employer]® Patient Information ©2015 [employer]. This information is not intended to replace advice given to you by your health care provider. Make sure you discuss any questions you have with your health care provider. ° °

## 2014-12-05 NOTE — Progress Notes (Signed)
   SUBJECTIVE: no chest pain , dizziness or SOB post LAD PCI. BP is going up.    Filed Vitals:   12/04/14 2022 12/04/14 2100 12/05/14 0020 12/05/14 0611  BP: 131/79 126/76 134/75 157/94  Pulse: 76 69 79 78  Temp: 97.6 F (36.4 C)  97.5 F (36.4 C) 97.4 F (36.3 C)  TempSrc: Oral  Oral Oral  Resp: 11 9 24 20   Height:      Weight:   217 lb 13 oz (98.8 kg)   SpO2: 99% 97% 94% 97%    Intake/Output Summary (Last 24 hours) at 12/05/14 0745 Last data filed at 12/05/14 0612  Gross per 24 hour  Intake 759.41 ml  Output   3050 ml  Net -2290.59 ml    LABS: Basic Metabolic Panel:  Recent Labs  12/03/14 1827 12/04/14 1849  NA 134* 137  K 3.4* 3.6  CL 101 109  CO2 25 25  GLUCOSE 189* 128*  BUN 9 6  CREATININE 1.16 0.94  CALCIUM 9.0 8.6   Liver Function Tests: No results for input(s): AST, ALT, ALKPHOS, BILITOT, PROT, ALBUMIN in the last 72 hours. No results for input(s): LIPASE, AMYLASE in the last 72 hours. CBC:  Recent Labs  12/04/14 1849 12/05/14 0501  WBC 4.8 6.1  HGB 12.3* 12.1*  HCT 36.4* 36.5*  MCV 83.1 83.7  PLT 177 179   Cardiac Enzymes:  Recent Labs  12/03/14 2235 12/04/14 0140 12/04/14 0331  TROPONINI 0.03 <0.03 0.03   BNP: Invalid input(s): POCBNP D-Dimer: No results for input(s): DDIMER in the last 72 hours. Hemoglobin A1C:  Recent Labs  12/03/14 1827  HGBA1C 5.7*   Fasting Lipid Panel: No results for input(s): CHOL, HDL, LDLCALC, TRIG, CHOLHDL, LDLDIRECT in the last 72 hours. Thyroid Function Tests: No results for input(s): TSH, T4TOTAL, T3FREE, THYROIDAB in the last 72 hours.  Invalid input(s): FREET3 Anemia Panel: No results for input(s): VITAMINB12, FOLATE, FERRITIN, TIBC, IRON, RETICCTPCT in the last 72 hours.   PHYSICAL EXAM General: Well developed, well nourished, in no acute distress HEENT:  Normocephalic and atramatic Neck:  No JVD.  Lungs: Clear bilaterally to auscultation and percussion. Heart: HRRR . Normal S1 and  S2 without gallops or murmurs.  Abdomen: Bowel sounds are positive, abdomen soft and non-tender  Msk:  Back normal, normal gait. Normal strength and tone for age. Extremities: No clubbing, cyanosis or edema.   Neuro: Alert and oriented X 3. Psych:  Good affect, responds appropriately Right radial pulse is normal with no hematoma.   TELEMETRY: Reviewed telemetry pt in NSR  ASSESSMENT AND PLAN:  1. 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  Continue dual antiplatelet therapy for at least 1 year.  Ok to discharge.  Refer to cardiac rehab.   . 2. Syncope - possible related to coronary ischemia worsened by hypotension.   3. HTN: resume Diovan at a smaller dose of 160 mg daily and switch Atenolol to Metoprolol 25 mg bid.   Kathlyn Sacramento, MD, [facility] 12/05/2014 7:45 AM

## 2014-12-05 NOTE — Progress Notes (Signed)
CARDIAC REHAB PHASE I   PRE:  Rate/Rhythm: 90 SR  BP:  Supine: 154/89  Sitting:   Standing:    SaO2:   MODE:  Ambulation: 800 ft   POST:  Rate/Rhythm: 100 SR  BP:  Supine:   Sitting:   Standing: 172/95   SaO2:  0855-0950 Pt walked 800 ft with steady gait. No CP. Tolerated well. No dizziness. Education completed with pt and wife who voiced understanding. Discussed CRP2 and left brochure. Pt declines at this time as he wants to just ex on his own. Let pt know that with HGA1C at 5.7 that he needs to watch sugars and carbs and continue to see Medical Doctor to keep check of this. Gave heart healthy diet and discussed watching carbs. Reviewed NTG use and stressed importance of brilinta with stent. Has brilinta card. Encouraged pt to call CRP 2 if he changes his mind. Brochure with our number on front.   Graylon Good, RN BSN  12/05/2014 9:46 AM

## 2014-12-05 NOTE — Telephone Encounter (Signed)
Pt was seen in the hospital (went to ED for eval as recommended for continuing s/s).  Had cardiac cath with PCI of CAD.

## 2014-12-05 NOTE — Discharge Summary (Signed)
CARDIOLOGY DISCHARGE SUMMARY   Patient ID: Brandon Haynes MRN: [id number] DOB/AGE: 72-Oct-1942 72 y.o.  Admit date: 12/03/2014 Discharge date: 12/05/2014  PCP: Tammi Sou, MD Primary Cardiologist: Dr. Radford Pax  Primary Discharge Diagnosis:  Unstable anginal pain Secondary Discharge Diagnosis:    HYPERCHOLESTEROLEMIA   Essential hypertension   Syncope  Procedures: Cardiac catheterization, coronary arteriogram, left ventriculogram, PTCA and stenting to the LAD, 2-D echocardiogram, carotid Dopplers  Hospital Course: Brandon Haynes is a 74 y.o. male with no history of CAD. He had chest tightness and came to the hospital after syncopal episode. He was admitted for further evaluation and treatment.  His cardiac enzymes were negative for MI. His symptoms were concerning for unstable anginal pain and he was taken to the cath lab on 12/04/2014.  Cardiac catheterization results are below. He had 2 vessel disease with a 99% LAD and an occluded RCA. The RCA was felt well collateralized and medical therapy was recommended. Dr. Martinique to percutaneous intervention to the LAD and he tolerated the procedure well.  Because of hypotension on admission, his blood pressure medications were held. His medications were restarted with the exception of HCTZ, but his beta blocker was changed to metoprolol and Diovan was decreased.  On 12/05/2014, Mr. Proch was seen by Dr. Fletcher Anon and all data were reviewed. His post procedure labs were stable. He was having no chest pain or shortness of breath. He was seen by cardiac rehabilitation and educated on heart-healthy lifestyle modifications, stent restrictions and exercise guidelines. No further inpatient workup is indicated and he is considered stable for discharge, to follow up as an outpatient.  BP 154/89 mmHg  Pulse 86  Temp(Src) 97.7 F (36.5 C) (Oral)  Resp 20  Ht 6\' 2"  (1.88 m)  Wt 217 lb 13 oz (98.8 kg)  BMI 27.95 kg/m2  SpO2 99% General: Well  developed, well nourished, male in no acute distress Head: Eyes PERRLA, No xanthomas.   Normocephalic and atraumatic  Lungs: Clear bilaterally to auscultation. Heart: HRRR S1 S2, without MRG.  Pulses are 2+ & equal. No carotid bruit. No JVD.  Abdomen: Bowel sounds are present, abdomen soft and non-tender without masses or  hernias noted. Msk: Normal strength and tone for age. Extremities: No clubbing, cyanosis or edema. Right radial cath site without ecchymosis or hematoma  Skin:  No rashes or lesions noted. Neuro: Alert and oriented X 3. Psych:  Good affect, responds appropriately  Labs:   Lab Results  Component Value Date   WBC 6.1 12/05/2014   HGB 12.1* 12/05/2014   HCT 36.5* 12/05/2014   MCV 83.7 12/05/2014   PLT 179 12/05/2014     Recent Labs Lab 12/05/14 0501  NA 142  K 4.2  CL 109  CO2 26  BUN 5*  CREATININE 0.93  CALCIUM 9.2  GLUCOSE 80    Recent Labs  12/03/14 2235 12/04/14 0140 12/04/14 0331  TROPONINI 0.03 <0.03 0.03      Radiology: Dg Chest 2 View  12/03/2014   CLINICAL DATA:  Intermittent dizziness and chest tightness for 1 month. Patient passed out on Sunday night. No injury. Shortness of breath and leg tingling. Previous smoker.  EXAM: CHEST  2 VIEW  COMPARISON:  02/21/2013  FINDINGS: Normal heart size and pulmonary vascularity. No focal airspace disease or consolidation in the lungs. No blunting of costophrenic angles. No pneumothorax. Mediastinal contours appear intact. Calcified granuloma in the left lung base. Calcified aorta.  IMPRESSION: No active cardiopulmonary disease.  Electronically Signed   By: Lucienne Capers M.D.   On: 12/03/2014 18:46    Cardiac Cath: 12/04/2014 Left main: Large. Normal.  LAD: Very large vessel wrapping apex. Gives off 2 diagonals. Apical LAD bifurcates into a "whale tail". Mild plaque throughout. In the mid vessel there is a 99% stenosis immediately followed by an aneurysmal segment. There is a 40% stenosis after this.    LCX: Large non-dominant vessel. Gives off 2 small Ramus branches. Very large OM-1. 40% stenosis in mid OM-1 with myocardial bridging segment. Distal AV groove LCX with 40-50% stenosis with robust L -> R collaterals. Small PL.  RCA: Dominant vessel. Mild to moderate plaque throughout. 30-40% proximal lesion. 30% mid. 50% hazy lesion around distal bend. The RCA is occluded after the take-off of a large RV marginal. The distal RCA system fills well from L-> R collaterals.  LV-gram done in the RAO projection: Ejection fraction = 55% no regional wall motion abnormalities Assessment: 1. 2v CAD as described above 2. Normal EF Plan/Discussion: PCI LAD with Dr. Martinique. The distal RCA is totally occluded but well collateralized from the left system. Will need aggressive risk factor management.   12/04/2014 Lesion Data: Vessel: LAD Percent stenosis (pre): 99% TIMI-flow (pre): 3 Stent: 4.0 x 12 mm Synergy stent Percent stenosis (post): 0% TIMI-flow (post): 3 Conclusions: Successful stenting of the mid LAD with DES. Recommendations: DAPT for one year. Will treat residual disease in the RCA medically. Anticipate DC in am.   EKG: 12/05/2014 Sinus rhythm  Carotid Dopplers 12/04/2014 Summary: - Technically difficult due to high bifurcation and tortuosity. - Bilateral - 1% to 9% ICA stenosis. Vertebral artery flow is antegrade. - Cannot rule out a possible chronic trombus of the internal jugular vein.  Echo: 12/02/2014 Conclusions - Left ventricle: The cavity size was normal. There was mild focal basal hypertrophy. Systolic function was normal. The estimated ejection fraction was in the range of 50% to 55%. There is akinesis of the basal-midinferoseptal myocardium. There is akinesis of the mid-apicalanteroseptal myocardium. There was an increased relative contribution of atrial contraction to ventricular filling. Doppler parameters are consistent with abnormal left  ventricular relaxation (grade 1 diastolic dysfunction). - Aortic valve: There was trivial regurgitation. - Mitral valve: Calcified annulus.  FOLLOW UP PLANS AND APPOINTMENTS No Known Allergies   Medication List    STOP taking these medications        atenolol 50 MG tablet  Commonly known as:  TENORMIN     hydrochlorothiazide 25 MG tablet  Commonly known as:  HYDRODIURIL      TAKE these medications        allopurinol 300 MG tablet  Commonly known as:  ZYLOPRIM  Take 1 tablet by mouth  daily     aspirin 81 MG tablet  Take 81 mg by mouth daily.     CRESTOR 20 MG tablet  Generic drug:  rosuvastatin  Take 1 tablet by mouth  daily     fluticasone 50 MCG/ACT nasal spray  Commonly known as:  FLONASE  Place 1 spray into both nostrils daily.     gabapentin 300 MG capsule  Commonly known as:  NEURONTIN  Take 300 mg by mouth 4 (four) times daily.     loratadine 10 MG tablet  Commonly known as:  CLARITIN  Take 10 mg by mouth daily. As needed     metoprolol tartrate 25 MG tablet  Commonly known as:  LOPRESSOR  Take 1 tablet (25 mg total) by mouth  2 (two) times daily.     nitroGLYCERIN 0.4 MG SL tablet  Commonly known as:  NITROSTAT  Place 1 tablet (0.4 mg total) under the tongue every 5 (five) minutes as needed for chest pain.     Olopatadine HCl 0.6 % Soln  Place into the nose 2 (two) times daily.     omeprazole 20 MG capsule  Commonly known as:  PRILOSEC  TAKE 1 CAPSULE BY MOUTH TWICE DAILY 30 MINUTES BEFORE BREAKFAST AND DINNER     sildenafil 100 MG tablet  Commonly known as:  VIAGRA  Take 1 tablet (100 mg total) by mouth as needed for erectile dysfunction.     ticagrelor 90 MG Tabs tablet  Commonly known as:  BRILINTA  Take 1 tablet (90 mg total) by mouth 2 (two) times daily.     traMADol 50 MG tablet  Commonly known as:  ULTRAM  TAKE 1 TABLET BY MOUTH THREE TIMES DAILY AS NEEDED FOR FOR PAIN     valsartan 160 MG tablet  Commonly known as:  DIOVAN    Take 1 tablet (160 mg total) by mouth daily.        Discharge Instructions    Call MD for:  difficulty breathing, headache or visual disturbances    Complete by:  As directed      Call MD for:  persistant dizziness or light-headedness    Complete by:  As directed      Call MD for:  persistant nausea and vomiting    Complete by:  As directed      Call MD for:  severe uncontrolled pain    Complete by:  As directed      Diet - low sodium heart healthy    Complete by:  As directed      Increase activity slowly    Complete by:  As directed      Increase activity slowly    Complete by:  As directed           Follow-up Information    Follow up with Sueanne Margarita, MD.   Specialty:  Cardiology   Why:  The office will call.    Contact information:   [address] [place] [address] [phone]       Follow up with Tammi Sou, MD. Schedule an appointment as soon as possible for a visit in 2 weeks.   Specialty:  Family Medicine   Why:  Follow up appt after recent hospitalization   Contact information:   [address] [place] [address] [phone]       BRING ALL MEDICATIONS WITH YOU TO FOLLOW UP APPOINTMENTS  Time spent with patient to include physician time: 42 min Signed: Rosaria Ferries, PA-C 12/05/2014, 11:27 AM Co-Sign MD

## 2014-12-06 ENCOUNTER — Telehealth: Payer: Self-pay | Admitting: Physician Assistant

## 2014-12-06 NOTE — Care Management Note (Signed)
    Page 1 of 1   12/06/2014     3:36:21 PM CARE MANAGEMENT NOTE 12/06/2014  Patient:  Brandon Haynes, Brandon Haynes   Account Number:  [id number]  Date Initiated:  12/05/2014  Documentation initiated by:  Tashona Calk  Subjective/Objective Assessment:   Pt s/p PCI  of LAD on 3/8     Action/Plan:   Pt to dc on Brilinta.  Pt given Brilinta booklet with 30 day free trial card.   Anticipated DC Date:  12/05/2014   Anticipated DC Plan:  [place]  CM consult  Medication Assistance      Choice offered to / List presented to:             Status of service:  Completed, signed off Medicare Important Message given?  NA - LOS <3 / Initial given by admissions (If response is "NO", the following Medicare IM given date fields will be blank) Date Medicare IM given:   Medicare IM given by:   Date Additional Medicare IM given:   Additional Medicare IM given by:    Discharge Disposition:  HOME/SELF CARE  Per UR Regulation:  Reviewed for med. necessity/level of care/duration of stay  If discussed at [place] of Stay Meetings, dates discussed:    Comments:  12/05/14 Ellan Lambert, RN, BSN [phone]  Called [employer] at [phone]. Talked to [place]. BRILINTA covered as a TIER 2 Medication. No Prior Authorization required. $25.00 Co-Payment for a 30 Day Supply and $50.00 Co-Payment for a 90 Day Supply. Available at [employer].

## 2014-12-06 NOTE — Telephone Encounter (Signed)
Patient contacted regarding discharge from [facility] 3/9  Patient understands to follow up with provider ? 3/17 @ 9:30am wit Tenny Craw, PA Patient understands discharge instructions? Yes, pt has no c/o of pain or swelling at cath site.  Pt stated been "taking it easy" since being home.  Patient understands medications and regiment? Yes  Patient understands to bring all medications to this visit? Yes

## 2014-12-06 NOTE — Telephone Encounter (Signed)
New message       TCM appt on 12-13-14 per [facility].

## 2014-12-07 ENCOUNTER — Encounter: Payer: Self-pay | Admitting: Family Medicine

## 2014-12-13 ENCOUNTER — Ambulatory Visit (INDEPENDENT_AMBULATORY_CARE_PROVIDER_SITE_OTHER): Payer: Medicare Other | Admitting: Physician Assistant

## 2014-12-13 ENCOUNTER — Encounter: Payer: Self-pay | Admitting: Physician Assistant

## 2014-12-13 VITALS — BP 138/80 | HR 49 | Ht 74.0 in | Wt 217.0 lb

## 2014-12-13 DIAGNOSIS — I10 Essential (primary) hypertension: Secondary | ICD-10-CM

## 2014-12-13 DIAGNOSIS — I2510 Atherosclerotic heart disease of native coronary artery without angina pectoris: Secondary | ICD-10-CM

## 2014-12-13 DIAGNOSIS — E7800 Pure hypercholesterolemia, unspecified: Secondary | ICD-10-CM

## 2014-12-13 DIAGNOSIS — I25119 Atherosclerotic heart disease of native coronary artery with unspecified angina pectoris: Secondary | ICD-10-CM

## 2014-12-13 MED ORDER — VALSARTAN 160 MG PO TABS: 240.0000 mg | ORAL_TABLET | Freq: Every day | ORAL | Status: DC

## 2014-12-13 MED ORDER — TICAGRELOR 90 MG PO TABS: 90.0000 mg | ORAL_TABLET | Freq: Two times a day (BID) | ORAL | Status: DC

## 2014-12-13 MED ORDER — METOPROLOL TARTRATE 25 MG PO TABS: 25.0000 mg | ORAL_TABLET | Freq: Two times a day (BID) | ORAL | Status: DC

## 2014-12-13 NOTE — Assessment & Plan Note (Signed)
Continue statin. 

## 2014-12-13 NOTE — Progress Notes (Signed)
Patient ID: PINCHUS WECKWERTH, male   DOB: 07/05/1941, 74 y.o.   MRN: [id number]    Date:  12/13/2014   ID:  DEKLYN GIBBON, DOB 24-May-1941, MRN [id number]  PCP:  Tammi Sou, MD  Primary Cardiologist:  Radford Pax  Chief complaint: Posthospital follow-up   History of Present Illness: ABOU STERKEL is a 74 y.o. male with no history of CAD. He was admitted from 12/03/2014 and March 9 with chest tightness and syncopal episode.  His cardiac enzymes were negative for MI. His symptoms were concerning for unstable anginal pain and he was taken to the cath lab on 12/04/2014.   Cardiac catheterization revealed 2 vessel disease with a 99% LAD and an occluded RCA. The RCA was felt well collateralized and medical therapy was recommended. Dr. Martinique completed percutaneous intervention to the LAD and he tolerated the procedure well.  LV function was 55%.    Because of hypotension on admission, his blood pressure medications were held. His medications were restarted with the exception of HCTZ, but his beta blocker was changed to metoprolol and Diovan was decreased.  Echocardiogram revealed there isakinesis of the basal-midinferoseptal myocardium. There isakinesis of the mid-apicalanteroseptal myocardium. Rate 1 diastolic dysfunction.  Patient presents today for posthospital follow-up.  He reports doing quite well. He's been walking up to 1/2 mile per day without any problems. He is taking Brilinta as directed. Blood pressure has been ranging 122-152/72-91 home  The patient currently denies nausea, vomiting, fever, chest pain, shortness of breath, orthopnea, dizziness, PND, cough, congestion, abdominal pain, hematochezia, melena, lower extremity edema, claudication.  Wt Readings from Last 3 Encounters:  12/13/14 217 lb (98.431 kg)  12/05/14 217 lb 13 oz (98.8 kg)  10/18/14 214 lb (97.07 kg)     Past Medical History  Diagnosis Date  . Chest pain 2010    stress testing neg; GERD likely  . Hypertension   .  Hypercholesteremia   . Hearing loss   . Allergic rhinitis     allergy shots via [place]  . Peripheral vascular disease   . GERD (gastroesophageal reflux disease)   . Hx of colonic polyps   . DDD (degenerative disc disease), lumbar   . Gout   . Chronic low back pain   . Anxiety   . History  of basal cell carcinoma   . Pleural thickening     chronic  . 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  RCA dz present but good collateral flow present: DAPT x 1 yr + aggressive medical mgmt of residual dz was recommended by cardiology.    Current Outpatient Prescriptions  Medication Sig Dispense Refill  . allopurinol (ZYLOPRIM) 300 MG tablet Take 1 tablet by mouth  daily 90 tablet 1  . aspirin 81 MG tablet Take 81 mg by mouth daily.      . CRESTOR 20 MG tablet Take 1 tablet by mouth  daily 90 tablet 1  . fluticasone (FLONASE) 50 MCG/ACT nasal spray Place 1 spray into both nostrils daily.    Marland Kitchen gabapentin (NEURONTIN) 300 MG capsule Take 300 mg by mouth 4 (four) times daily.     Marland Kitchen loratadine (CLARITIN) 10 MG tablet Take 10 mg by mouth daily. As needed     . metoprolol tartrate (LOPRESSOR) 25 MG tablet Take 1 tablet (25 mg total) by mouth 2 (two) times daily. 60 tablet 11  .  nitroGLYCERIN (NITROSTAT) 0.4 MG SL tablet Place 1 tablet (0.4 mg total) under the tongue every 5 (five) minutes as needed for chest pain. 25 tablet 3  . Olopatadine HCl 0.6 % SOLN Place into the nose 2 (two) times daily.    Marland Kitchen omeprazole (PRILOSEC) 20 MG capsule TAKE 1 CAPSULE BY MOUTH TWICE DAILY 30 MINUTES BEFORE BREAKFAST AND DINNER 180 capsule 2  . PREVIDENT 5000 BOOSTER PLUS 1.1 % PSTE Take by mouth at bedtime. APPLY TO TEETH  1  . sildenafil (VIAGRA) 100 MG tablet Take 1 tablet (100 mg total) by mouth as needed for erectile dysfunction. 10 tablet 6  . ticagrelor (BRILINTA) 90 MG TABS tablet Take 1 tablet (90 mg total) by mouth 2  (two) times daily. 60 tablet 11  . traMADol (ULTRAM) 50 MG tablet TAKE 1 TABLET BY MOUTH THREE TIMES DAILY AS NEEDED FOR FOR PAIN 90 tablet 5  . valsartan (DIOVAN) 160 MG tablet Take 1.5 tablets (240 mg total) by mouth daily. 45 tablet 5   No current facility-administered medications for this visit.    Allergies:   No Known Allergies  Social History:  The patient  reports that he quit smoking about 16 years ago. His smoking use included Cigarettes. He has a 70 pack-year smoking history. He has quit using smokeless tobacco. He reports that he does not drink alcohol or use illicit drugs.   Family history:   Family History  Problem Relation Age of Onset  . Lung cancer Father   . Heart disease Father   . Breast cancer Mother   . Congestive Heart Failure Mother   . Diabetes Mother   . Breast cancer Sister   . Heart disease Brother   . COPD Brother     ROS:  Please see the history of present illness.  All other systems reviewed and negative.   PHYSICAL EXAM: VS:  BP 138/80 mmHg  Pulse 49  Ht 6\' 2"  (1.88 m)  Wt 217 lb (98.431 kg)  BMI 27.85 kg/m2 Well nourished, well developed, in no acute distress HEENT: Pupils are equal round react to light accommodation extraocular movements are intact.  Neck: no JVDNo cervical lymphadenopathy. Cardiac: Regular rate and rhythm without murmurs rubs or gallops. Lungs:  clear to auscultation bilaterally, no wheezing, rhonchi or rales Abd: soft, nontender, positive bowel sounds all quadrants, no hepatosplenomegaly Ext: Trace left greater than right lower extremity edema.  2+ radial and dorsalis pedis pulses. Skin: warm and dry Neuro:  Grossly normal  EKG:  Sinus bradycardia rate 49 bpm. T-wave inversions V1 through 4    ASSESSMENT AND PLAN:  Problem List Items Addressed This Visit    HYPERCHOLESTEROLEMIA    Continue statin      Relevant Medications   valsartan (DIOVAN) tablet   Essential hypertension    Patient's blood pressures ranging  122-152/72-91. He is consistently in the 140s. I'm going to increase the Diovan to 240.  He will continue to monitor his blood pressure. Heart rate today is 49       Relevant Medications   valsartan (DIOVAN) tablet   Coronary artery disease involving native coronary artery of native heart without angina pectoris - Primary    Patient reports doing quite well. His had no further chest pain. He is walking up to half mile every day. He has decided not to do phase II cardiac rehabilitation. I recommended he increase his activity every couple of weeks about a half mile, to a point where he is  walking 2-3 miles a day.  He taking the Brilinta as directed.      Relevant Medications   valsartan (DIOVAN) tablet   Other Relevant Orders   EKG 12-Lead

## 2014-12-13 NOTE — Patient Instructions (Addendum)
Your physician has recommended you make the following change in your medication:  1) INCREASE Diovan to 240mg  (1 and 1/2 tablet) daily. An Rx has been sent to your pharmacy.   Take all other medications as prescribed  Increase exercise slowly  Your physician recommends that you schedule a follow-up appointment in: 1-2 months with Dr.Turner

## 2014-12-13 NOTE — Assessment & Plan Note (Signed)
Patient's blood pressures ranging 122-152/72-91. He is consistently in the 140s. I'm going to increase the Diovan to 240.  He will continue to monitor his blood pressure. Heart rate today is 49

## 2014-12-13 NOTE — Assessment & Plan Note (Signed)
Patient reports doing quite well. His had no further chest pain. He is walking up to half mile every day. He has decided not to do phase II cardiac rehabilitation. I recommended he increase his activity every couple of weeks about a half mile, to a point where he is walking 2-3 miles a day.  He taking the Brilinta as directed.

## 2014-12-14 ENCOUNTER — Institutional Professional Consult (permissible substitution): Payer: Medicare Other | Admitting: Internal Medicine

## 2014-12-20 ENCOUNTER — Ambulatory Visit (INDEPENDENT_AMBULATORY_CARE_PROVIDER_SITE_OTHER): Payer: Medicare Other | Admitting: Family Medicine

## 2014-12-20 ENCOUNTER — Encounter: Payer: Self-pay | Admitting: Family Medicine

## 2014-12-20 VITALS — BP 120/80 | HR 62 | Temp 98.6°F | Ht 74.0 in | Wt 216.0 lb

## 2014-12-20 DIAGNOSIS — J069 Acute upper respiratory infection, unspecified: Secondary | ICD-10-CM

## 2014-12-20 DIAGNOSIS — J208 Acute bronchitis due to other specified organisms: Secondary | ICD-10-CM

## 2014-12-20 NOTE — Progress Notes (Signed)
OFFICE NOTE  12/20/2014  CC:  Chief Complaint  Patient presents with  . Cough    HPI: Patient is a 74 y.o. Caucasian male who is here for onset of cough, nasal congestion, scratchy throat 4-5d ago. Malaise+ but no fever.  Some of his upper resps sx's have improved.  Cough is starting to get productive now since starting mucinex.  Cough worse at night, also worse with moving around some. Tylenol and tramadol for HA that he is having with this.  No SOB or wheezing.  Appetite is poor with this, but no signif n/v.  Has had some loose stools last couple of days.   +He is an ex-smoker 50 pack-yr hx, quit about 20 yrs ago.  Pertinent PMH:  Past medical, surgical, social, and family history reviewed and no changes are noted since last office visit.  MEDS:  Outpatient Prescriptions Prior to Visit  Medication Sig Dispense Refill  . allopurinol (ZYLOPRIM) 300 MG tablet Take 1 tablet by mouth  daily 90 tablet 1  . aspirin 81 MG tablet Take 81 mg by mouth daily.      . CRESTOR 20 MG tablet Take 1 tablet by mouth  daily 90 tablet 1  . fluticasone (FLONASE) 50 MCG/ACT nasal spray Place 1 spray into both nostrils daily.    Marland Kitchen gabapentin (NEURONTIN) 300 MG capsule Take 300 mg by mouth 4 (four) times daily.     Marland Kitchen loratadine (CLARITIN) 10 MG tablet Take 10 mg by mouth daily. As needed     . metoprolol tartrate (LOPRESSOR) 25 MG tablet Take 1 tablet (25 mg total) by mouth 2 (two) times daily. 180 tablet 1  . nitroGLYCERIN (NITROSTAT) 0.4 MG SL tablet Place 1 tablet (0.4 mg total) under the tongue every 5 (five) minutes as needed for chest pain. 25 tablet 3  . Olopatadine HCl 0.6 % SOLN Place into the nose 2 (two) times daily.    Marland Kitchen omeprazole (PRILOSEC) 20 MG capsule TAKE 1 CAPSULE BY MOUTH TWICE DAILY 30 MINUTES BEFORE BREAKFAST AND DINNER 180 capsule 2  . PREVIDENT 5000 BOOSTER PLUS 1.1 % PSTE Take by mouth at bedtime. APPLY TO TEETH  1  . sildenafil (VIAGRA) 100 MG tablet Take 1 tablet (100 mg total) by  mouth as needed for erectile dysfunction. 10 tablet 6  . ticagrelor (BRILINTA) 90 MG TABS tablet Take 1 tablet (90 mg total) by mouth 2 (two) times daily. 180 tablet 1  . traMADol (ULTRAM) 50 MG tablet TAKE 1 TABLET BY MOUTH THREE TIMES DAILY AS NEEDED FOR FOR PAIN 90 tablet 5  . valsartan (DIOVAN) 160 MG tablet Take 1.5 tablets (240 mg total) by mouth daily. 135 tablet 1   No facility-administered medications prior to visit.    PE: Blood pressure 120/80, pulse 62, temperature 98.6 F (37 C), temperature source Oral, height 6\' 2"  (1.88 m), weight 216 lb (97.977 kg), SpO2 96 %. VS: noted--normal. Gen: alert, NAD, NONTOXIC APPEARING. HEENT: eyes without injection, drainage, or swelling.  Ears: EACs clear, TMs with normal light reflex and landmarks.  Nose: Clear rhinorrhea, with some dried, crusty exudate adherent to mildly injected mucosa.  No purulent d/c.  No paranasal sinus TTP.  No facial swelling.  Throat and mouth without focal lesion.  No pharyngial swelling, erythema, or exudate.   Neck: supple, no LAD.   LUNGS: CTA bilat, nonlabored resps.   CV: RRR, no m/r/g. EXT: no c/c/e SKIN: no rash    IMPRESSION AND PLAN:  Viral URI/bronchitis,  no sign of RAD. Reassured pt. Encouraged rest, hydration, continue mucinex DM and prn tylenol and/or tramadol for HA.  An After Visit Summary was printed and given to the patient.   FOLLOW UP: prn

## 2014-12-20 NOTE — Patient Instructions (Signed)
Continue mucinex DM for cough as needed. Drink plenty of clear liquids. REST!!

## 2014-12-20 NOTE — Progress Notes (Signed)
Pre visit review using our clinic review tool, if applicable. No additional management support is needed unless otherwise documented below in the visit note. 

## 2014-12-25 ENCOUNTER — Ambulatory Visit: Payer: Medicare Other

## 2015-01-08 ENCOUNTER — Ambulatory Visit (INDEPENDENT_AMBULATORY_CARE_PROVIDER_SITE_OTHER): Payer: Medicare Other | Admitting: Podiatry

## 2015-01-08 DIAGNOSIS — B351 Tinea unguium: Secondary | ICD-10-CM | POA: Diagnosis not present

## 2015-01-08 DIAGNOSIS — M79673 Pain in unspecified foot: Secondary | ICD-10-CM | POA: Diagnosis not present

## 2015-01-08 NOTE — Progress Notes (Signed)
Subjective:     Patient ID: Brandon Haynes, male   DOB: November 02, 1940, 74 y.o.   MRN: [id number]  HPI Presents today chief complaint of painful elongated toenails.  Objective: Pulses are palpable bilateral nails are thick, yellow dystrophic onychomycosis and painful palpation.   Assessment: Onychomycosis with pain in limb.  Plan: Treatment of nails in thickness and length as covered service secondary to pain.   Review of Systems     Objective:   Physical Exam     Assessment:     Chronic nail infection with pain 1-5 both feet    Plan:     Debride painful nailbeds 1-5 both feet with no iatrogenic bleeding noted

## 2015-02-25 ENCOUNTER — Encounter: Payer: Self-pay | Admitting: Cardiology

## 2015-02-25 NOTE — Progress Notes (Signed)
Cardiology Office Note   Date:  02/26/2015   ID:  Rustin, Erhart [birth year], MRN [id number]  PCP:  Tammi Sou, MD    Chief Complaint  Patient presents with  . Follow-up    essential hypertension, CAD, hyperlipidemia      History of Present Illness:Brandon Haynes is a 74 y.o. male with no history of CAD. He was admitted from 12/03/2014 and March 9 with chest tightness and syncopal episode. His cardiac enzymes were negative for MI. His symptoms were concerning for unstable anginal pain and he was taken to the cath lab on 12/04/2014. Cardiac catheterization revealed 2 vessel disease with a 99% LAD and an occluded RCA. The RCA was felt well collateralized and medical therapy was recommended. Dr. Martinique completed percutaneous intervention to the LAD and he tolerated the procedure well. LV function was 55%.   Because of hypotension on admission, his blood pressure medications were held. His medications were restarted with the exception of HCTZ, but his beta blocker was changed to metoprolol and Diovan was decreased. Echocardiogram revealed akinesis of the basal-midinferoseptal myocardium. There wasakinesis of the mid-apicalanteroseptal myocardium.   Patient presents today for  follow-up. He reports doing quite well. He's been walking up to 1/2 mile per day without any problems. He is taking Brilinta as directed.   The patient currently denies nausea, shortness of breath, orthopnea, dizziness, PND, lower extremity edema, claudication.  He has had only 1 episode of very light CP that he thought was indigestion but took a SL NTG and it resolved.  He has not had any further discomfort.      Past Medical History  Diagnosis Date  . Hypertension   . Hypercholesteremia   . Hearing loss   . Allergic rhinitis     allergy shots via   . Peripheral vascular disease   . GERD (gastroesophageal reflux disease)   . Hx of colonic polyps   . DDD (degenerative  disc disease), lumbar   . Gout   . Chronic low back pain   . Anxiety   . History  of basal cell carcinoma   . Pleural thickening     chronic  . 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  RCA dz present but good collateral flow present: DAPT x 1 yr + aggressive medical mgmt of residual dz was recommended by cardiology.    Past Surgical History  Procedure Laterality Date  . Back injection  oct 2011, nov 2011    no surgery  . Shoulder injection  feb 2014  . Back injection  01/11/2013  . Rotator cuff repair  09/2013    left  . Cardiovascular stress test  05/2009    negative/normal  . Colonoscopy  08/2008    repeat 7 yrs  . Left heart catheterization with coronary angiogram N/A 12/04/2014    Procedure: LEFT HEART CATHETERIZATION WITH CORONARY ANGIOGRAM;  Surgeon: Jolaine Artist, MD; LAD 99% then aneurysmal, OM1 40%, CFX 50%, RCA 40%, 50% then occluded  . Coronary angioplasty with stent placement  12/04/2014    4.0 x 12 mm Synergy stent to the mid LAD  . Carotid dopplers  11/2014    Bilateral - 1% to 9% ICA stenosis. Vertebral artery flow is  . Transthoracic echocardiogram  11/2014  EF 50-55%, some wall motion abnormalities noted, grade I diast dysfxn     Current Outpatient Prescriptions  Medication Sig Dispense Refill  . allopurinol (ZYLOPRIM) 300 MG tablet Take 1 tablet by mouth  daily 90 tablet 0  . aspirin 81 MG tablet Take 81 mg by mouth daily.      . clotrimazole-betamethasone (LOTRISONE) cream Apply 1 application topically daily.  1  . CRESTOR 20 MG tablet Take 1 tablet by mouth  daily 90 tablet 0  . desonide (DESOWEN) 0.05 % cream Apply 1 application topically daily.  1  . fluticasone (FLONASE) 50 MCG/ACT nasal spray Place 1 spray into both nostrils daily.    Marland Kitchen gabapentin (NEURONTIN) 300 MG capsule Take 300 mg by mouth 4 (four) times daily.     Marland Kitchen loratadine (CLARITIN) 10 MG  tablet Take 10 mg by mouth daily. As needed     . metoprolol tartrate (LOPRESSOR) 25 MG tablet Take 1 tablet (25 mg total) by mouth 2 (two) times daily. 180 tablet 1  . nitroGLYCERIN (NITROSTAT) 0.4 MG SL tablet Place 1 tablet (0.4 mg total) under the tongue every 5 (five) minutes as needed for chest pain. 25 tablet 3  . Olopatadine HCl 0.6 % SOLN Place into the nose 2 (two) times daily.    Marland Kitchen omeprazole (PRILOSEC) 20 MG capsule TAKE 1 CAPSULE BY MOUTH TWICE DAILY 30 MINUTES BEFORE BREAKFAST AND DINNER 180 capsule 2  . PREVIDENT 5000 BOOSTER PLUS 1.1 % PSTE Take by mouth at bedtime. APPLY TO TEETH  1  . sildenafil (VIAGRA) 100 MG tablet Take 1 tablet (100 mg total) by mouth as needed for erectile dysfunction. 10 tablet 6  . ticagrelor (BRILINTA) 90 MG TABS tablet Take 1 tablet (90 mg total) by mouth 2 (two) times daily. 180 tablet 1  . traMADol (ULTRAM) 50 MG tablet TAKE 1 TABLET BY MOUTH THREE TIMES DAILY AS NEEDED FOR FOR PAIN 90 tablet 5  . valsartan (DIOVAN) 160 MG tablet Take 1.5 tablets (240 mg total) by mouth daily. 135 tablet 1   No current facility-administered medications for this visit.    Allergies:   Review of patient's allergies indicates no known allergies.    Social History:  The patient  reports that he quit smoking about 16 years ago. His smoking use included Cigarettes. He has a 70 pack-year smoking history. He has quit using smokeless tobacco. He reports that he does not drink alcohol or use illicit drugs.   Family History:  The patient's family history includes Breast cancer in his mother and sister; COPD in his brother; Congestive Heart Failure in his mother; Diabetes in his mother; Heart disease in his brother and father; Lung cancer in his father.    ROS:  Please see the history of present illness.   Otherwise, review of systems are positive for none.   All other systems are reviewed and negative.    PHYSICAL EXAM: VS:  BP 130/76 mmHg  Pulse 61  Ht 6\' 2"  (1.88 m)   Wt 215 lb 6.4 oz (97.705 kg)  BMI 27.64 kg/m2  SpO2 94% , BMI Body mass index is 27.64 kg/(m^2). GEN: Well nourished, well developed, in no acute distress HEENT: normal Neck: no JVD, carotid bruits, or masses Cardiac: RRR; no murmurs, rubs, or gallops,no edema  Respiratory:  clear to auscultation bilaterally, normal work of breathing GI: soft, nontender, nondistended, + BS MS: no deformity or atrophy Skin: warm and dry, no rash Neuro:  Strength and sensation are  intact Psych: euthymic mood, full affect   EKG:  EKG is not ordered today.    Recent Labs: 03/22/2014: ALT 24; TSH 1.29 12/05/2014: BUN 5*; Creatinine 0.93; Hemoglobin 12.1*; Platelets 179; Potassium 4.2; Sodium 142    Lipid Panel    Component Value Date/Time   CHOL 155 03/22/2014 1024   TRIG 103.0 03/22/2014 1024   HDL 57.80 03/22/2014 1024   CHOLHDL 3 03/22/2014 1024   VLDL 20.6 03/22/2014 1024   LDLCALC 77 03/22/2014 1024      Wt Readings from Last 3 Encounters:  02/26/15 215 lb 6.4 oz (97.705 kg)  12/20/14 216 lb (97.977 kg)  12/13/14 217 lb (98.431 kg)       ASSESSMENT AND PLAN:  1.  ASCAD with PCI of 99% stenosed LAD and chronically occluded RCA with collateral flow on DAPT with ASA and Brilinta 2.  HTN - controlled on Diovan/BB 3.  Dyslipidemia - continue statin.  Recheck FLP and ALT   Current medicines are reviewed at length with the patient today.  The patient does not have concerns regarding medicines.  The following changes have been made:  no change  Labs/ tests ordered today: See above Assessment and Plan No orders of the defined types were placed in this encounter.     Disposition:   FU with me in 6 months  Signed, Sueanne Margarita, MD  02/26/2015 2:34 PM    [facility] [place], [place], [place]  [address] Phone: [phone]; Fax: [phone]

## 2015-02-26 ENCOUNTER — Encounter: Payer: Self-pay | Admitting: Cardiology

## 2015-02-26 ENCOUNTER — Other Ambulatory Visit: Payer: Self-pay | Admitting: Physician Assistant

## 2015-02-26 ENCOUNTER — Ambulatory Visit (INDEPENDENT_AMBULATORY_CARE_PROVIDER_SITE_OTHER): Payer: Medicare Other | Admitting: Cardiology

## 2015-02-26 ENCOUNTER — Other Ambulatory Visit: Payer: Self-pay | Admitting: Family Medicine

## 2015-02-26 VITALS — BP 130/76 | HR 61 | Ht 74.0 in | Wt 215.4 lb

## 2015-02-26 DIAGNOSIS — E7800 Pure hypercholesterolemia, unspecified: Secondary | ICD-10-CM

## 2015-02-26 DIAGNOSIS — I10 Essential (primary) hypertension: Secondary | ICD-10-CM | POA: Diagnosis not present

## 2015-02-26 DIAGNOSIS — I2510 Atherosclerotic heart disease of native coronary artery without angina pectoris: Secondary | ICD-10-CM

## 2015-02-26 NOTE — Patient Instructions (Signed)
Medication Instructions:  Your physician recommends that you continue on your current medications as directed. Please refer to the Current Medication list given to you today.   Labwork: Your physician recommends that you return for FASTING lab work in: 1 week. You may come ANYTIME between 7:30 AM and 5:00 PM.   Testing/Procedures: None  Follow-Up: Your physician wants you to follow-up in: 6 months with Dr. Radford Pax. You will receive a reminder letter in the mail two months in advance. If you don't receive a letter, please call our office to schedule the follow-up appointment.   Any Other Special Instructions Will Be Listed Below (If Applicable).

## 2015-02-27 ENCOUNTER — Other Ambulatory Visit: Payer: Medicare Other

## 2015-03-06 ENCOUNTER — Other Ambulatory Visit (INDEPENDENT_AMBULATORY_CARE_PROVIDER_SITE_OTHER): Payer: Medicare Other | Admitting: *Deleted

## 2015-03-06 DIAGNOSIS — E7800 Pure hypercholesterolemia, unspecified: Secondary | ICD-10-CM

## 2015-03-06 DIAGNOSIS — I10 Essential (primary) hypertension: Secondary | ICD-10-CM

## 2015-03-06 LAB — LIPID PANEL
Cholesterol: 117 mg/dL (ref 0–200)
HDL: 48.3 mg/dL (ref >39.00)
LDL CALC: 47 mg/dL (ref 0–99)
NonHDL: 68.7
TRIGLYCERIDES: 110 mg/dL (ref 0.0–149.0)
Total CHOL/HDL Ratio: 2
VLDL: 22 mg/dL (ref 0.0–40.0)

## 2015-03-06 LAB — HEPATIC FUNCTION PANEL
ALBUMIN: 4.6 g/dL (ref 3.5–5.2)
ALT: 12 U/L (ref 0–53)
AST: 19 U/L (ref 0–37)
Alkaline Phosphatase: 50 U/L (ref 39–117)
BILIRUBIN DIRECT: 0.1 mg/dL (ref 0.0–0.3)
Total Bilirubin: 0.7 mg/dL (ref 0.2–1.2)
Total Protein: 7 g/dL (ref 6.0–8.3)

## 2015-03-06 NOTE — Addendum Note (Signed)
Addended by: Eulis Foster on: 03/06/2015 08:10 AM   Modules accepted: Orders

## 2015-03-06 NOTE — Addendum Note (Signed)
Addended by: Eulis Foster on: 03/06/2015 08:11 AM   Modules accepted: Orders

## 2015-04-15 ENCOUNTER — Other Ambulatory Visit: Payer: Self-pay | Admitting: Family Medicine

## 2015-04-15 NOTE — Telephone Encounter (Signed)
RF request for Tramadol. LOV: 12/20/14 acute ov, 10/18/14 f/u ov Next ov: 04/18/15 Last written: 10/18/14 #90 w/ 5RF

## 2015-04-18 ENCOUNTER — Ambulatory Visit (INDEPENDENT_AMBULATORY_CARE_PROVIDER_SITE_OTHER): Payer: Medicare Other | Admitting: Family Medicine

## 2015-04-18 ENCOUNTER — Ambulatory Visit: Payer: Medicare Other | Admitting: Family Medicine

## 2015-04-18 ENCOUNTER — Encounter: Payer: Self-pay | Admitting: Family Medicine

## 2015-04-18 VITALS — BP 118/72 | HR 49 | Temp 97.9°F | Resp 16 | Ht 74.0 in | Wt 212.0 lb

## 2015-04-18 DIAGNOSIS — I10 Essential (primary) hypertension: Secondary | ICD-10-CM

## 2015-04-18 DIAGNOSIS — Z86010 Personal history of colonic polyps: Secondary | ICD-10-CM | POA: Diagnosis not present

## 2015-04-18 DIAGNOSIS — D649 Anemia, unspecified: Secondary | ICD-10-CM

## 2015-04-18 DIAGNOSIS — E7800 Pure hypercholesterolemia, unspecified: Secondary | ICD-10-CM

## 2015-04-18 DIAGNOSIS — I2510 Atherosclerotic heart disease of native coronary artery without angina pectoris: Secondary | ICD-10-CM | POA: Diagnosis not present

## 2015-04-18 NOTE — Progress Notes (Signed)
OFFICE VISIT  04/18/2015   CC:  Chief Complaint  Patient presents with  . Follow-up    6 month f/u, pt is not fasting.    HPI:    Patient is a 74 y.o. Caucasian male who presents for 6 mo f/u HTN, hyperlipidemia, CAD, and chronic low back pain/DDD (arthritis in both big toes, too). Recent cardiology f/u was good: FLP and hepatic panel normal.  No med changes were made. He needs to be on DAPT until at least 11/2015.  No chest pains.  He doesn't feel the energy level that he felt after getting his stent placed 11/2014. He is walking daily, active on his property/gardening daily.  No exertional SOB/CP. He checks his bp 2-3 times a week: normal. Compliant with crestor.  Back: "I need an injection" but due to being on DAPT his neurosurgeon said he can't have injection until he has been off Brylinta for at least 2 weeks.   Says he has chronic pain with intermittent radiation of pain down R leg.  Taking tramadol for pain, also sometimes tylenol.  Takes avg of 3 tramadol tabs per day.  Past Medical History  Diagnosis Date  . Hypertension   . Hypercholesteremia   . Hearing loss   . Allergic rhinitis     allergy shots via [place]  . Peripheral vascular disease   . GERD (gastroesophageal reflux disease)   . Hx of colonic polyps   . DDD (degenerative disc disease), lumbar   . Gout   . Chronic low back pain   . Anxiety   . History  of basal cell carcinoma   . Pleural thickening     chronic  . 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  RCA dz present but good collateral flow present: DAPT x 1 yr + aggressive medical mgmt of residual dz was recommended by cardiology.    Past Surgical History  Procedure Laterality Date  . Back injection  oct 2011, 07/2010, 12/2012    no surgery  . Shoulder injection  feb 2014  . Rotator cuff repair  09/2013    left  . Cardiovascular stress test  05/2009   negative/normal  . Colonoscopy  08/2008    repeat 7 yrs  . Left heart catheterization with coronary angiogram N/A 12/04/2014    Procedure: LEFT HEART CATHETERIZATION WITH CORONARY ANGIOGRAM;  Surgeon: Jolaine Artist, MD; LAD 99% then aneurysmal, OM1 40%, CFX 50%, RCA 40%, 50% then occluded  . Coronary angioplasty with stent placement  12/04/2014    4.0 x 12 mm Synergy stent to the mid LAD  . Carotid dopplers  11/2014    Bilateral - 1% to 9% ICA stenosis. Vertebral artery flow is  . Transthoracic echocardiogram  11/2014    EF 50-55%, some wall motion abnormalities noted, grade I diast dysfxn    Outpatient Prescriptions Prior to Visit  Medication Sig Dispense Refill  . allopurinol (ZYLOPRIM) 300 MG tablet Take 1 tablet by mouth  daily 90 tablet 0  . aspirin 81 MG tablet Take 81 mg by mouth daily.      Marland Kitchen BRILINTA 90 MG TABS tablet Take 1 tablet by mouth two  times daily 180 tablet 1  . clotrimazole-betamethasone (LOTRISONE) cream Apply 1 application topically daily.  1  . CRESTOR 20 MG tablet Take 1 tablet by mouth  daily 90 tablet 0  . desonide (  DESOWEN) 0.05 % cream Apply 1 application topically daily.  1  . fluticasone (FLONASE) 50 MCG/ACT nasal spray Place 1 spray into both nostrils daily.    Marland Kitchen gabapentin (NEURONTIN) 300 MG capsule Take 300 mg by mouth 4 (four) times daily.     Marland Kitchen loratadine (CLARITIN) 10 MG tablet Take 10 mg by mouth daily. As needed     . metoprolol tartrate (LOPRESSOR) 25 MG tablet Take 1 tablet (25 mg total) by mouth 2 (two) times daily. 180 tablet 1  . nitroGLYCERIN (NITROSTAT) 0.4 MG SL tablet Place 1 tablet (0.4 mg total) under the tongue every 5 (five) minutes as needed for chest pain. 25 tablet 3  . Olopatadine HCl 0.6 % SOLN Place into the nose 2 (two) times daily.    Marland Kitchen omeprazole (PRILOSEC) 20 MG capsule TAKE 1 CAPSULE BY MOUTH TWICE DAILY 30 MINUTES BEFORE BREAKFAST AND DINNER 180 capsule 2  . PREVIDENT 5000 BOOSTER PLUS 1.1 % PSTE Take by mouth at bedtime.  APPLY TO TEETH  1  . sildenafil (VIAGRA) 100 MG tablet Take 1 tablet (100 mg total) by mouth as needed for erectile dysfunction. 10 tablet 6  . traMADol (ULTRAM) 50 MG tablet TAKE 1 TABLET BY MOUTH THREE TIMES DAILY AS NEEDED FOR PAIN 90 tablet 5  . valsartan (DIOVAN) 160 MG tablet Take 1 and 1/2 tablets by  mouth daily 135 tablet 5   No facility-administered medications prior to visit.    No Known Allergies  ROS As per HPI  PE: Blood pressure 118/72, pulse 49, temperature 97.9 F (36.6 C), temperature source Oral, resp. rate 16, height 6\' 2"  (1.88 m), weight 212 lb (96.163 kg), SpO2 96 %. Gen: Alert, well appearing.  Patient is oriented to person, place, time, and situation. AFFECT: pleasant, lucid thought and speech. No further exam today.  LABS:  Lab Results  Component Value Date   HGBA1C 5.7* 12/03/2014    Lab Results  Component Value Date   TSH 1.29 03/22/2014   Lab Results  Component Value Date   WBC 6.1 12/05/2014   HGB 12.1* 12/05/2014   HCT 36.5* 12/05/2014   MCV 83.7 12/05/2014   PLT 179 12/05/2014   Lab Results  Component Value Date   CREATININE 0.93 12/05/2014   BUN 5* 12/05/2014   NA 142 12/05/2014   K 4.2 12/05/2014   CL 109 12/05/2014   CO2 26 12/05/2014   Lab Results  Component Value Date   ALT 12 03/06/2015   AST 19 03/06/2015   ALKPHOS 50 03/06/2015   BILITOT 0.7 03/06/2015   Lab Results  Component Value Date   CHOL 117 03/06/2015   Lab Results  Component Value Date   HDL 48.30 03/06/2015   Lab Results  Component Value Date   LDLCALC 47 03/06/2015   Lab Results  Component Value Date   TRIG 110.0 03/06/2015   Lab Results  Component Value Date   CHOLHDL 2 03/06/2015   Lab Results  Component Value Date   PSA 2.13 03/22/2014   PSA 1.77 02/21/2013   PSA 1.90 02/25/2012   IMPRESSION AND PLAN:  1) HTN; The current medical regimen is effective;  continue present plan and medications.  2) Hyperlipidemia: The current medical  regimen is effective;  continue present plan and medications. FLP and hepatic panel normal last month.  3) CAD; stent in LAD.  DAPT until at least 11/2015. Given his mild dip in energy level over the last few months and being on  DAPT, will check CBC to f/u his mildly low Hb when in hospital for his stent placement 11/2014.  If Hb still low, will add iron labs and potentially check hemoccults x 3.  4) Chronic LBP/DDD with radiculopathy: neurosurgeon following him.  Continue tramadol at current usage: 3 tabs a day. Neurosurgeon may proceed with steroid injection AFTER pt gets off of DAPT.  5) Hx of adenomatous colon polyps: he is due for repeat colonoscopy: Instructions: Call [place] office and schedule colonoscopy b/c you are due for repeat of this b/c of your history of colon polyps.  An After Visit Summary was printed and given to the patient.  FOLLOW UP: Return in about 6 months (around 10/19/2015) for annual medicare wellness visit.

## 2015-04-18 NOTE — Patient Instructions (Signed)
Call [place] office and schedule colonoscopy b/c you are due for repeat of this b/c of your history of colon polyps.

## 2015-04-18 NOTE — Progress Notes (Signed)
Pre visit review using our clinic review tool, if applicable. No additional management support is needed unless otherwise documented below in the visit note. 

## 2015-04-19 LAB — CBC, NO DIFFERENTIAL/PLATELET
HEMOGLOBIN: 11.8 g/dL — AB (ref 12.6–17.7)
Hematocrit: 36.8 % — ABNORMAL LOW (ref 37.5–51.0)
MCH: 26.2 pg — ABNORMAL LOW (ref 26.6–33.0)
MCHC: 32.1 g/dL (ref 31.5–35.7)
MCV: 82 fL (ref 79–97)
RBC: 4.5 x10E6/uL (ref 4.14–5.80)
RDW: 15.6 % — ABNORMAL HIGH (ref 12.3–15.4)
WBC: 6.8 10*3/uL (ref 3.4–10.8)

## 2015-04-22 ENCOUNTER — Ambulatory Visit: Payer: Medicare Other

## 2015-04-26 ENCOUNTER — Ambulatory Visit: Payer: Medicare Other | Admitting: Podiatry

## 2015-04-29 ENCOUNTER — Other Ambulatory Visit: Payer: Medicare Other

## 2015-04-30 ENCOUNTER — Other Ambulatory Visit: Payer: Medicare Other

## 2015-04-30 ENCOUNTER — Telehealth: Payer: Self-pay | Admitting: Family Medicine

## 2015-04-30 DIAGNOSIS — D509 Iron deficiency anemia, unspecified: Secondary | ICD-10-CM

## 2015-04-30 LAB — HEMOCCULT SLIDES (X 3 CARDS)
Fecal Occult Blood: NEGATIVE
OCCULT 1: NEGATIVE
OCCULT 2: NEGATIVE
OCCULT 3: NEGATIVE
OCCULT 4: NEGATIVE
OCCULT 5: NEGATIVE

## 2015-04-30 NOTE — Telephone Encounter (Signed)
Order entered

## 2015-04-30 NOTE — Telephone Encounter (Signed)
[facility] called and needs an order for a Hemacult for Brandon Haynes. Please enter asap.

## 2015-05-02 ENCOUNTER — Encounter: Payer: Self-pay | Admitting: Podiatry

## 2015-05-02 ENCOUNTER — Ambulatory Visit (INDEPENDENT_AMBULATORY_CARE_PROVIDER_SITE_OTHER): Payer: Medicare Other | Admitting: Podiatry

## 2015-05-02 DIAGNOSIS — B351 Tinea unguium: Secondary | ICD-10-CM | POA: Diagnosis not present

## 2015-05-02 DIAGNOSIS — M79673 Pain in unspecified foot: Secondary | ICD-10-CM | POA: Diagnosis not present

## 2015-05-02 NOTE — Progress Notes (Signed)
Patient ID: Brandon Haynes, male   DOB: 1941/02/16, 74 y.o.   MRN: [id number] Complaint:  Visit Type: Patient returns to my office for continued preventative foot care services. Complaint: Patient states" my nails have grown long and thick and become painful to walk and wear shoes" . The patient presents for preventative foot care services. No changes to ROS.  He has been applying topical fungal medicine and he says his toes are better.  Podiatric Exam: Vascular: dorsalis pedis and posterior tibial pulses are palpable bilateral. Capillary return is immediate. Temperature gradient is WNL. Skin turgor WNL  Sensorium: Normal Semmes Weinstein monofilament test. Normal tactile sensation bilaterally. Nail Exam: Pt has thick disfigured discolored nails with subungual debris noted bilateral entire nail hallux through fifth toenails Ulcer Exam: There is no evidence of ulcer or pre-ulcerative changes or infection. Orthopedic Exam: Muscle tone and strength are WNL. No limitations in general ROM. No crepitus or effusions noted. Foot type and digits show no abnormalities. Bony prominences are unremarkable.Severe arthritis 1st MPJ B/L and hammer toe second right foot. Skin: No Porokeratosis. No infection or ulcers  Diagnosis:  Onychomycosis, , Pain in right toe, pain in left toes  Treatment & Plan Procedures and Treatment: Consent by patient was obtained for treatment procedures. The patient understood the discussion of treatment and procedures well. All questions were answered thoroughly reviewed. Debridement of mycotic and hypertrophic toenails, 1 through 5 bilateral and clearing of subungual debris. No ulceration, no infection noted.  Return Visit-Office Procedure: Patient instructed to return to the office for a follow up visit 3 months for continued evaluation and treatment.

## 2015-05-29 ENCOUNTER — Other Ambulatory Visit: Payer: Self-pay | Admitting: Family Medicine

## 2015-05-29 NOTE — Telephone Encounter (Signed)
RF request for omeprazole LOV: 04/18/15 Next ov: 10/18/15 Last written: 08/09/14 #180 w/ 2RF

## 2015-06-07 ENCOUNTER — Other Ambulatory Visit: Payer: Self-pay | Admitting: Family Medicine

## 2015-06-07 ENCOUNTER — Other Ambulatory Visit: Payer: Self-pay | Admitting: Physician Assistant

## 2015-06-07 NOTE — Telephone Encounter (Signed)
LOV: 04/18/15 NOV: 10/18/15  RF request for allopurinol Last written: 02/26/15 #90 w/ 0RF  RF request for crestor Last written: 02/26/15 #90 w/ 0RF

## 2015-07-15 ENCOUNTER — Other Ambulatory Visit: Payer: Self-pay | Admitting: Dermatology

## 2015-07-26 ENCOUNTER — Other Ambulatory Visit: Payer: Self-pay | Admitting: Cardiology

## 2015-08-01 ENCOUNTER — Ambulatory Visit (INDEPENDENT_AMBULATORY_CARE_PROVIDER_SITE_OTHER): Payer: Medicare Other | Admitting: Podiatry

## 2015-08-01 DIAGNOSIS — B351 Tinea unguium: Secondary | ICD-10-CM

## 2015-08-01 DIAGNOSIS — M79673 Pain in unspecified foot: Secondary | ICD-10-CM

## 2015-08-01 NOTE — Progress Notes (Signed)
Patient ID: Brandon Haynes, male   DOB: 08/07/1941, 74 y.o.   MRN: [id number] Complaint:  Visit Type: Patient returns to my office for continued preventative foot care services. Complaint: Patient states" my nails have grown long and thick and become painful to walk and wear shoes" . The patient presents for preventative foot care services. No changes to ROS.  He has been applying topical fungal medicine and he says his toes are better.  Podiatric Exam: Vascular: dorsalis pedis and posterior tibial pulses are palpable bilateral. Capillary return is immediate. Temperature gradient is WNL. Skin turgor WNL  Sensorium: Normal Semmes Weinstein monofilament test. Normal tactile sensation bilaterally. Nail Exam: Pt has thick disfigured discolored nails with subungual debris noted bilateral entire nail hallux through fifth toenails Ulcer Exam: There is no evidence of ulcer or pre-ulcerative changes or infection. Orthopedic Exam: Muscle tone and strength are WNL. No limitations in general ROM. No crepitus or effusions noted. Foot type and digits show no abnormalities. Bony prominences are unremarkable.Severe arthritis 1st MPJ B/L and hammer toe second right foot. Skin: No Porokeratosis. No infection or ulcers  Diagnosis:  Onychomycosis, , Pain in right toe, pain in left toes  Treatment & Plan Procedures and Treatment: Consent by patient was obtained for treatment procedures. The patient understood the discussion of treatment and procedures well. All questions were answered thoroughly reviewed. Debridement of mycotic and hypertrophic toenails, 1 through 5 bilateral and clearing of subungual debris. No ulceration, no infection noted.  Return Visit-Office Procedure: Patient instructed to return to the office for a follow up visit 3 months for continued evaluation and treatment. 

## 2015-08-15 ENCOUNTER — Telehealth: Payer: Self-pay | Admitting: *Deleted

## 2015-08-15 NOTE — Telephone Encounter (Signed)
Pt LMOM on 08/15/15 at 2:29pm he was requesting dates of his Colon CA Screening and Prostate CA Screening. I advised pt that he had hemoccult cards done on 04/30/15 and that his last PSA was 03/22/14 and that Dr. Anitra Lauth will discuss both of these with him at his next f/u in January 2017. Pt voiced understanding.

## 2015-08-23 ENCOUNTER — Encounter (HOSPITAL_COMMUNITY): Payer: Self-pay | Admitting: *Deleted

## 2015-08-23 ENCOUNTER — Emergency Department (HOSPITAL_COMMUNITY): Payer: Medicare Other

## 2015-08-23 ENCOUNTER — Telehealth: Payer: Self-pay | Admitting: Nurse Practitioner

## 2015-08-23 ENCOUNTER — Observation Stay (HOSPITAL_COMMUNITY)
Admission: EM | Admit: 2015-08-23 | Discharge: 2015-08-24 | Disposition: A | Discharge status: Home / Self Care | Payer: Medicare Other | Attending: Cardiovascular Disease | Admitting: Cardiovascular Disease

## 2015-08-23 ENCOUNTER — Other Ambulatory Visit: Payer: Self-pay

## 2015-08-23 DIAGNOSIS — E7800 Pure hypercholesterolemia, unspecified: Secondary | ICD-10-CM | POA: Diagnosis not present

## 2015-08-23 DIAGNOSIS — R21 Rash and other nonspecific skin eruption: Secondary | ICD-10-CM

## 2015-08-23 DIAGNOSIS — R42 Dizziness and giddiness: Secondary | ICD-10-CM

## 2015-08-23 DIAGNOSIS — R079 Chest pain, unspecified: Secondary | ICD-10-CM

## 2015-08-23 DIAGNOSIS — Z87891 Personal history of nicotine dependence: Secondary | ICD-10-CM | POA: Insufficient documentation

## 2015-08-23 DIAGNOSIS — H9190 Unspecified hearing loss, unspecified ear: Secondary | ICD-10-CM | POA: Insufficient documentation

## 2015-08-23 DIAGNOSIS — R0600 Dyspnea, unspecified: Secondary | ICD-10-CM | POA: Diagnosis not present

## 2015-08-23 DIAGNOSIS — R0789 Other chest pain: Secondary | ICD-10-CM

## 2015-08-23 DIAGNOSIS — G8929 Other chronic pain: Secondary | ICD-10-CM | POA: Diagnosis not present

## 2015-08-23 DIAGNOSIS — I2510 Atherosclerotic heart disease of native coronary artery without angina pectoris: Secondary | ICD-10-CM | POA: Diagnosis not present

## 2015-08-23 DIAGNOSIS — Z79899 Other long term (current) drug therapy: Secondary | ICD-10-CM | POA: Insufficient documentation

## 2015-08-23 DIAGNOSIS — R55 Syncope and collapse: Secondary | ICD-10-CM | POA: Diagnosis not present

## 2015-08-23 DIAGNOSIS — Z86010 Personal history of colonic polyps: Secondary | ICD-10-CM | POA: Diagnosis not present

## 2015-08-23 DIAGNOSIS — Z7951 Long term (current) use of inhaled steroids: Secondary | ICD-10-CM | POA: Insufficient documentation

## 2015-08-23 DIAGNOSIS — I10 Essential (primary) hypertension: Secondary | ICD-10-CM | POA: Diagnosis not present

## 2015-08-23 DIAGNOSIS — Z9861 Coronary angioplasty status: Secondary | ICD-10-CM | POA: Diagnosis not present

## 2015-08-23 DIAGNOSIS — M159 Polyosteoarthritis, unspecified: Secondary | ICD-10-CM | POA: Diagnosis not present

## 2015-08-23 DIAGNOSIS — K219 Gastro-esophageal reflux disease without esophagitis: Secondary | ICD-10-CM | POA: Diagnosis present

## 2015-08-23 DIAGNOSIS — M109 Gout, unspecified: Secondary | ICD-10-CM | POA: Diagnosis present

## 2015-08-23 DIAGNOSIS — F419 Anxiety disorder, unspecified: Secondary | ICD-10-CM | POA: Diagnosis not present

## 2015-08-23 DIAGNOSIS — I208 Other forms of angina pectoris: Secondary | ICD-10-CM

## 2015-08-23 DIAGNOSIS — R0609 Other forms of dyspnea: Secondary | ICD-10-CM

## 2015-08-23 DIAGNOSIS — R531 Weakness: Secondary | ICD-10-CM | POA: Diagnosis present

## 2015-08-23 HISTORY — DX: Dizziness and giddiness: R42

## 2015-08-23 HISTORY — DX: Rash and other nonspecific skin eruption: R21

## 2015-08-23 LAB — URINALYSIS, ROUTINE W REFLEX MICROSCOPIC
BILIRUBIN URINE: NEGATIVE
GLUCOSE, UA: NEGATIVE mg/dL
HGB URINE DIPSTICK: NEGATIVE
Ketones, ur: NEGATIVE mg/dL
Leukocytes, UA: NEGATIVE
Nitrite: NEGATIVE
Protein, ur: NEGATIVE mg/dL
SPECIFIC GRAVITY, URINE: 1.012 (ref 1.005–1.030)
pH: 6.5 (ref 5.0–8.0)

## 2015-08-23 LAB — BASIC METABOLIC PANEL
Anion gap: 7 (ref 5–15)
BUN: 11 mg/dL (ref 6–20)
CHLORIDE: 105 mmol/L (ref 101–111)
CO2: 27 mmol/L (ref 22–32)
CREATININE: 1.15 mg/dL (ref 0.61–1.24)
Calcium: 9.5 mg/dL (ref 8.9–10.3)
GFR calc non Af Amer: >60 mL/min (ref >60)
GLUCOSE: 96 mg/dL (ref 65–99)
Potassium: 4.2 mmol/L (ref 3.5–5.1)
Sodium: 139 mmol/L (ref 135–145)

## 2015-08-23 LAB — CBC
HCT: 42.2 % (ref 39.0–52.0)
HEMOGLOBIN: 13.5 g/dL (ref 13.0–17.0)
MCH: 27.1 pg (ref 26.0–34.0)
MCHC: 32 g/dL (ref 30.0–36.0)
MCV: 84.6 fL (ref 78.0–100.0)
Platelets: 178 10*3/uL (ref 150–400)
RBC: 4.99 MIL/uL (ref 4.22–5.81)
RDW: 16.1 % — ABNORMAL HIGH (ref 11.5–15.5)
WBC: 10 10*3/uL (ref 4.0–10.5)

## 2015-08-23 LAB — TROPONIN I

## 2015-08-23 LAB — I-STAT TROPONIN, ED: Troponin i, poc: 0 ng/mL (ref 0.00–0.08)

## 2015-08-23 LAB — CBG MONITORING, ED: Glucose-Capillary: 113 mg/dL — ABNORMAL HIGH (ref 65–99)

## 2015-08-23 MED ORDER — DIPHENHYDRAMINE HCL 25 MG PO CAPS: 50.0000 mg | ORAL_CAPSULE | Freq: Three times a day (TID) | ORAL | Status: DC | PRN

## 2015-08-23 MED ORDER — SODIUM FLUORIDE 1.1 % DT PSTE: 1.0000 "application " | PASTE | Freq: Every day | DENTAL | Status: DC

## 2015-08-23 MED ORDER — ALLOPURINOL 300 MG PO TABS
300.0000 mg | ORAL_TABLET | Freq: Every day | ORAL | Status: DC
  Administered 2015-08-24: 300 mg via ORAL
  Filled 2015-08-23: qty 1

## 2015-08-23 MED ORDER — ASPIRIN EC 81 MG PO TBEC
81.0000 mg | DELAYED_RELEASE_TABLET | Freq: Every day | ORAL | Status: DC
  Administered 2015-08-24: 81 mg via ORAL
  Filled 2015-08-23 (×2): qty 1

## 2015-08-23 MED ORDER — ACETAMINOPHEN 325 MG PO TABS: 650.0000 mg | ORAL_TABLET | ORAL | Status: DC | PRN

## 2015-08-23 MED ORDER — TICAGRELOR 90 MG PO TABS
90.0000 mg | ORAL_TABLET | Freq: Two times a day (BID) | ORAL | Status: DC
  Administered 2015-08-24: 90 mg via ORAL
  Filled 2015-08-23: qty 1

## 2015-08-23 MED ORDER — AZELASTINE HCL 0.1 % NA SOLN
1.0000 | Freq: Two times a day (BID) | NASAL | Status: DC
  Administered 2015-08-24: 1 via NASAL
  Filled 2015-08-23: qty 30

## 2015-08-23 MED ORDER — IRBESARTAN 75 MG PO TABS
225.0000 mg | ORAL_TABLET | Freq: Every day | ORAL | Status: DC
  Administered 2015-08-24: 225 mg via ORAL
  Filled 2015-08-23: qty 1

## 2015-08-23 MED ORDER — GABAPENTIN 300 MG PO CAPS
300.0000 mg | ORAL_CAPSULE | Freq: Four times a day (QID) | ORAL | Status: DC
  Administered 2015-08-23 – 2015-08-24 (×2): 300 mg via ORAL
  Filled 2015-08-23 (×2): qty 1

## 2015-08-23 MED ORDER — FLUTICASONE PROPIONATE 50 MCG/ACT NA SUSP
1.0000 | Freq: Every day | NASAL | Status: DC
  Administered 2015-08-24: 1 via NASAL
  Filled 2015-08-23: qty 16

## 2015-08-23 MED ORDER — SODIUM CHLORIDE 0.9 % IV SOLN: 250.0000 mL | INTRAVENOUS | Status: DC | PRN

## 2015-08-23 MED ORDER — TRIAMCINOLONE ACETONIDE 0.1 % EX CREA
1.0000 "application " | TOPICAL_CREAM | Freq: Every day | CUTANEOUS | Status: DC
  Administered 2015-08-24: 1 via TOPICAL
  Filled 2015-08-23: qty 15

## 2015-08-23 MED ORDER — PANTOPRAZOLE SODIUM 40 MG PO TBEC
40.0000 mg | DELAYED_RELEASE_TABLET | Freq: Two times a day (BID) | ORAL | Status: DC
  Administered 2015-08-24: 40 mg via ORAL
  Filled 2015-08-23: qty 1

## 2015-08-23 MED ORDER — SODIUM CHLORIDE 0.9 % IJ SOLN
3.0000 mL | Freq: Two times a day (BID) | INTRAMUSCULAR | Status: DC
  Administered 2015-08-24: 3 mL via INTRAVENOUS

## 2015-08-23 MED ORDER — TRAMADOL HCL 50 MG PO TABS: 50.0000 mg | ORAL_TABLET | Freq: Three times a day (TID) | ORAL | Status: DC | PRN

## 2015-08-23 MED ORDER — LORATADINE 10 MG PO TABS: 10.0000 mg | ORAL_TABLET | Freq: Every day | ORAL | Status: DC | PRN

## 2015-08-23 MED ORDER — HEPARIN SODIUM (PORCINE) 5000 UNIT/ML IJ SOLN
5000.0000 [IU] | Freq: Three times a day (TID) | INTRAMUSCULAR | Status: DC
  Filled 2015-08-23: qty 1

## 2015-08-23 MED ORDER — NITROGLYCERIN 0.4 MG SL SUBL: 0.4000 mg | SUBLINGUAL_TABLET | SUBLINGUAL | Status: DC | PRN

## 2015-08-23 MED ORDER — ONDANSETRON HCL 4 MG/2ML IJ SOLN: 4.0000 mg | Freq: Four times a day (QID) | INTRAMUSCULAR | Status: DC | PRN

## 2015-08-23 MED ORDER — OLOPATADINE HCL 0.6 % NA SOLN: 1.0000 | Freq: Two times a day (BID) | NASAL | Status: DC

## 2015-08-23 MED ORDER — ROSUVASTATIN CALCIUM 10 MG PO TABS
20.0000 mg | ORAL_TABLET | Freq: Every day | ORAL | Status: DC
  Administered 2015-08-24: 20 mg via ORAL
  Filled 2015-08-23: qty 2

## 2015-08-23 MED ORDER — METOPROLOL TARTRATE 25 MG PO TABS
25.0000 mg | ORAL_TABLET | Freq: Two times a day (BID) | ORAL | Status: DC
  Administered 2015-08-23 – 2015-08-24 (×2): 25 mg via ORAL
  Filled 2015-08-23 (×2): qty 1

## 2015-08-23 MED ORDER — SODIUM CHLORIDE 0.9 % IJ SOLN: 3.0000 mL | INTRAMUSCULAR | Status: DC | PRN

## 2015-08-23 NOTE — ED Notes (Signed)
Pt states has not been feeling well for last couple of days and states when working today got dizzy, short of breath and thought he was going to pass out.  Had same symptoms with last stent placement in March.

## 2015-08-23 NOTE — H&P (Signed)
History and Physical  Patient ID: Brandon Haynes MRN: [id number], DOB: [birth year] Date of Encounter: 08/23/2015, 7:16 PM Primary Physician: Tammi Sou, MD Primary Cardiologist: Dr. Radford Pax  Chief Complaint: dizzy, SOB Reason for Admission: possible unstable angina  HPI: Brandon Haynes is a 74 y/o M with history (CP/near-syncope 11/2014 s/p DES to mLAD, occluded RCA after takeoff of RV marginal with collaterals treated medically, otherwise mild nonobstructive disease), EF 55% by cath in 11/2014, HLD, HTN, GERD, DDD who presented to [facility] with the above symptoms. He says he has felt run down for several days. On Wednesday 11/23, he had an episode of dizziness when he went from a seated position to standing. On Thursday 11/24 he had an episode of brief chest pressure relieved with 2 Tums and a large belch. Today he went out for a walk and then began to shovel some dirt when he began to feel very short of breath and dizzy. He also had mild chest pressure. Today's episode reminded him of when he required PCI in 11/2014, although less severe. He called the office and was referred to the ER. He also reports a faint rash on his back for the past 2 weeks. He went to a dermatologist without apparent cause diagnosed, possibly a drug rash, and has been taking benadryl. VSS in ER - not tachypneic, hypoxic, tachycardic. Initial labs benign with normal CBC, BMET, troponin, UA. CXR without acute disease. EKG nonacute. Reports full compliance with meds. Orthostatics were negative in the ER.    Last 2D echo 11/2014: mild focal basal hypertrophy, EF 50-55%, +WMA, grade 1 DD. Carotid duplex 11/2014: bilateral 1-9% (?) ICA stenosis, cannot exclude a possible chronic thrombus of internal jugular vein.    Past Medical History  Diagnosis Date  . Hypertension   . Hypercholesteremia   . Hearing loss   . Allergic rhinitis     allergy shots via [place]  . Peripheral vascular disease ([place])   . GERD (gastroesophageal reflux  disease)   . Hx of colonic polyps   . DDD (degenerative disc disease), lumbar   . Gout   . Chronic low back pain   . Anxiety   . History  of basal cell carcinoma   . Pleural thickening     chronic  . 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 CAD, multiple vessel     a. CP/near-syncope 11/2014 s/p DES to MLAD, occluded RCA after takeoff of RV marginal with collaterals treated medically, otherwise mild nonobstructive disease. EF 55%.     Surgical History:  Past Surgical History  Procedure Laterality Date  . Back injection  oct 2011, 07/2010, 12/2012    no surgery  . Shoulder injection  feb 2014  . Rotator cuff repair  09/2013    left  . Cardiovascular stress test  05/2009    negative/normal  . Colonoscopy  08/2008    repeat 7 yrs  . Left heart catheterization with coronary angiogram N/A 12/04/2014    Procedure: LEFT HEART CATHETERIZATION WITH CORONARY ANGIOGRAM;  Surgeon: Jolaine Artist, MD; LAD 99% then aneurysmal, OM1 40%, CFX 50%, RCA 40%, 50% then occluded  . Coronary angioplasty with stent placement  12/04/2014    4.0 x 12 mm Synergy stent to the mid LAD  . Carotid dopplers  11/2014    Bilateral - 1% to 9% ICA stenosis. Vertebral artery flow is  . Transthoracic echocardiogram  11/2014    EF 50-55%, some wall motion abnormalities noted, grade I diast dysfxn  Home Meds: Prior to Admission medications   Medication Sig Start Date End Date Taking? Authorizing Provider  allopurinol (ZYLOPRIM) 300 MG tablet Take 1 tablet by mouth  daily Patient taking differently: Take 300 mg by mouth  daily 06/07/15  Yes Tammi Sou, MD  aspirin 81 MG tablet Take 81 mg by mouth daily.     Yes Historical Provider, MD  BRILINTA 90 MG TABS tablet Take 1 tablet by mouth two  times daily Patient taking differently: Take 90 mg by mouth two  times daily 02/27/15  Yes Sueanne Margarita, MD  clotrimazole-betamethasone (LOTRISONE) cream Apply 1 application topically daily. 02/12/15  Yes Historical  Provider, MD  CRESTOR 20 MG tablet Take 1 tablet by mouth  daily Patient taking differently: Take 20 mg by mouth  daily 06/07/15  Yes Tammi Sou, MD  desonide (DESOWEN) 0.05 % cream Apply 1 application topically daily. 02/11/15  Yes Historical Provider, MD  diphenhydrAMINE (BENADRYL) 25 MG tablet Take 50 mg by mouth 3 (three) times daily.   Yes Historical Provider, MD  EPIPEN 2-PAK 0.3 MG/0.3ML SOAJ injection USE UTD PRN FOR SYSTEMIC REACTION 05/20/15  Yes Historical Provider, MD  fluticasone (FLONASE) 50 MCG/ACT nasal spray Place 1 spray into both nostrils daily.   Yes Historical Provider, MD  gabapentin (NEURONTIN) 300 MG capsule Take 300 mg by mouth 4 (four) times daily.    Yes Historical Provider, MD  loratadine (CLARITIN) 10 MG tablet Take 10 mg by mouth daily as needed for allergies. As needed   Yes Historical Provider, MD  metoprolol tartrate (LOPRESSOR) 25 MG tablet Take 1 tablet by mouth two  times daily Patient taking differently: Take 25 mg by mouth two  times daily 07/26/15  Yes Sueanne Margarita, MD  nitroGLYCERIN (NITROSTAT) 0.4 MG SL tablet Place 1 tablet (0.4 mg total) under the tongue every 5 (five) minutes as needed for chest pain. 12/05/14  Yes Rhonda G Barrett, PA-C  Olopatadine HCl 0.6 % SOLN Place 1 puff into the nose 2 (two) times daily.    Yes Historical Provider, MD  omeprazole (PRILOSEC) 20 MG capsule TAKE ONE CAPSULE BY MOUTH TWICE DAILY 30 MINUTES BEFORE BREAKFAST AND DINNER Patient taking differently: TAKE 20 MG BY MOUTH TWICE DAILY 30 MINUTES BEFORE BREAKFAST AND DINNER 05/29/15  Yes Tammi Sou, MD  PREVIDENT 5000 BOOSTER PLUS 1.1 % PSTE Take by mouth at bedtime. APPLY TO TEETH 10/27/14  Yes Historical Provider, MD  sildenafil (VIAGRA) 100 MG tablet Take 1 tablet (100 mg total) by mouth as needed for erectile dysfunction. 11/05/14 07/03/16 Yes Tammi Sou, MD  traMADol (ULTRAM) 50 MG tablet TAKE 1 TABLET BY MOUTH THREE TIMES DAILY AS NEEDED FOR PAIN Patient taking  differently: TAKE 50 MG BY MOUTH THREE TIMES DAILY AS NEEDED FOR PAIN 04/15/15  Yes Tammi Sou, MD  triamcinolone cream (KENALOG) 0.1 % Apply 1 application topically daily. 08/18/15  Yes Historical Provider, MD  valsartan (DIOVAN) 160 MG tablet Take 1 and 1/2 tablets by  mouth daily Patient taking differently: Take 240 mg by  mouth daily 02/27/15  Yes Sueanne Margarita, MD    Allergies: No Known Allergies  Social History   Social History  . Marital Status: Married    Spouse Name: Vaughan Basta  . Number of Children: 3  . Years of Education: N/A   Occupational History  . retired    Social History Main Topics  . Smoking status: Former Smoker -- 2.00 packs/day for 35  years    Types: Cigarettes    Quit date: 09/28/1998  . Smokeless tobacco: Former Systems developer     Comment: as a teenager  . Alcohol Use: No     Comment: quit 1994  . Drug Use: No  . Sexual Activity: Not on file   Other Topics Concern  . Not on file   Social History Narrative   Married, 3 children.   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   Orig from [place], lived [place] same farm all his [place].   Former smoker: quit approx around [place].  80 pack-yr hx.   Alcohol-none, used to drink a lot of beer on weekends.  No drugs.   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   Diet: fair     Family History  Problem Relation Age of Onset  . Lung cancer Father   . Heart disease Father   . Breast cancer Mother   . Congestive Heart Failure Mother   . Diabetes Mother   . Breast cancer Sister   . Heart disease Brother   . COPD Brother     Review of Systems: no fevers, chills, weight change, edema, orthopnea, PND. All other systems reviewed and are otherwise negative except as noted above.  Labs:   Lab Results  Component Value Date   WBC 10.0 08/23/2015   HGB 13.5 08/23/2015   HCT 42.2 08/23/2015   MCV 84.6 08/23/2015   PLT 178 08/23/2015    Recent Labs Lab 08/23/15 1555  NA 139  K 4.2  CL 105    CO2 27  BUN 11  CREATININE 1.15  CALCIUM 9.5  GLUCOSE 96    Lab Results  Component Value Date   CHOL 117 03/06/2015   HDL 48.30 03/06/2015   LDLCALC 47 03/06/2015   TRIG 110.0 03/06/2015     Radiology/Studies:  Dg Chest 2 View  08/23/2015  CLINICAL DATA:  Weakness, cough, dyspnea EXAM: CHEST  2 VIEW COMPARISON:  12/03/2014 FINDINGS: Cardiomediastinal silhouette is stable. No acute infiltrate or pleural effusion. No pulmonary edema. Atherosclerotic calcifications of thoracic aorta. IMPRESSION: No active cardiopulmonary disease. Electronically Signed   By: Lahoma Crocker M.D.   On: 08/23/2015 19:08   Wt Readings from Last 3 Encounters:  04/18/15 212 lb (96.163 kg)  02/26/15 215 lb 6.4 oz (97.705 kg)  12/20/14 216 lb (97.977 kg)    EKG: NSR 65bpm without acute ST-T changes  Physical Exam: Blood pressure 134/100, pulse 71, temperature 97.7 F (36.5 C), temperature source Oral, resp. rate 13, SpO2 99 %. General: Well developed, well nourished WM, in no acute distress. Head: Normocephalic, atraumatic, sclera non-icteric, no xanthomas, nares are without discharge.  Neck: Negative for carotid bruits. JVD not elevated. Lungs: Clear bilaterally to auscultation without wheezes, rales, or rhonchi. Breathing is unlabored. Heart: RRR with S1 S2. No murmurs, rubs, or gallops appreciated. Abdomen: Soft, non-tender, non-distended with normoactive bowel sounds. No hepatomegaly. No rebound/guarding. No obvious abdominal masses. Msk:  Strength and tone appear normal for age. Extremities: No clubbing or cyanosis. No edema.  Distal pedal pulses are 2+ and equal bilaterally. Neuro: Alert and oriented X 3. No focal deficit. No facial asymmetry. Moves all extremities spontaneously. Psych:  Responds to questions appropriately with a normal affect. Skin: Very faint erythematous papular rash across back    ASSESSMENT AND PLAN:   1. Dyspnea/dizziness/chest pressure - symptoms are reminiscent of prior  angina although not as severe as 11/2014. No objective evidence of  ischemia thus far. At this time will admit, cycle troponins, and f/u EKG in AM. Follow on tele. Continue home medication regimen. Consider stress testing if he rules out. If he rules in, will need repeat LHC. Since he is presently asymptomatic and troponin is negative, will hold off heparin per pharmacy.  2. Rash - etiology not totally clear, localized to back. Will review with MD.  3. Essential HTN - since orthostatics were negative in ER, will continue current regimen and follow.  4. HLD - continue statin.  Signed, Charlie Pitter PA-C 08/23/2015, 7:16 PM Pager: [phone]  The patient was seen and examined and the case was discussed in detail with Melina Copa PA-C, and I agree with the assessment and plan as documented above. The patient and his family are also in agreement with this plan.   Kate Sable, MD, [facility]  08/23/2015 7:59 PM

## 2015-08-23 NOTE — Telephone Encounter (Signed)
Phone call this pm - reports weakness/dyspnea and chest pressure. Prior stent from 11/2014. Advised to seek medical attention at the [facility] ER. Patient is agreeable.   Burtis Junes, RN, [place] [address] [place], [place]  [phone]

## 2015-08-23 NOTE — Plan of Care (Signed)
Problem: Consults Goal: Chest Pain Patient Education (See Patient Education module for education specifics.) Outcome: Progressing Chest pain education plan initiated. General medical education plan initiated.  Patient is currently chest pain free with no other associative symptoms. He states that the symptomatic events that have been ongoing for some weeks at home, particularly today's event, were similar to his previous symptoms in March when he had a cardiac stent placed. Initial troponin I and other labs are unremarkable and 12-lead EKG done in ED does not show acute ST or T-wave abnormalities. Currently he is hypertensive: Today's Vitals    08/23/15 1945 08/23/15 2015 08/23/15 2136 08/23/15 2140  BP: 136/93 143/92 170/88    Pulse: 68 64 74    Temp:     98 F (36.7 C)    TempSrc:     Oral    Resp: 11 15 12     Height:     6\' 2"  (1.88 m)    Weight:     95.8 kg (211 lb 3.2 oz)    SpO2: 98% 100% 100%    PainSc:       0-No pain   He has been compliant with home medications and is currently due for metoprolol 25 mg PO. Will notify MD on-call for Dr. Bronson Ing should BP not respond to dose after it is given.  Progress per plan of care. Continuing to monitor closely.

## 2015-08-23 NOTE — ED Provider Notes (Signed)
CSN: [id number]     Arrival date & time 08/23/15  1501 History   First MD Initiated Contact with Patient 08/23/15 1649     Chief Complaint  Patient presents with  . Weakness     (Consider location/radiation/quality/duration/timing/severity/associated sxs/prior Treatment) HPI For several days the patient has been getting exertional shortness of breath and weakness. Today he was doing some yard work and became short of breath and weak. He had to sit down. He felt that he was close to passing out but did not lose consciousness. He reports he has been experiencing some chest tightness intermittently over the past several days with exertion. He reports the symptoms are very similar to when he had to get a stent placed last March. He denies fever, cough, nausea or vomiting. Past Medical History  Diagnosis Date  . Hypertension   . Hypercholesteremia   . Hearing loss   . Allergic rhinitis     allergy shots via [place]  . Peripheral vascular disease ([place])   . GERD (gastroesophageal reflux disease)   . Hx of colonic polyps   . DDD (degenerative disc disease), lumbar   . Gout   . Chronic low back pain   . Anxiety   . History  of basal cell carcinoma   . Pleural thickening     chronic  . 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 CAD, multiple vessel     a. CP/near-syncope 11/2014 s/p DES to MLAD, occluded RCA after takeoff of RV marginal with collaterals treated medically, otherwise mild nonobstructive disease. EF 55%.   Past Surgical History  Procedure Laterality Date  . Back injection  oct 2011, 07/2010, 12/2012    no surgery  . Shoulder injection  feb 2014  . Rotator cuff repair  09/2013    left  . Cardiovascular stress test  05/2009    negative/normal  . Colonoscopy  08/2008    repeat 7 yrs  . Left heart catheterization with coronary angiogram N/A 12/04/2014    Procedure: LEFT HEART CATHETERIZATION WITH CORONARY ANGIOGRAM;  Surgeon: Jolaine Artist, MD; LAD 99% then  aneurysmal, OM1 40%, CFX 50%, RCA 40%, 50% then occluded  . Coronary angioplasty with stent placement  12/04/2014    4.0 x 12 mm Synergy stent to the mid LAD  . Carotid dopplers  11/2014    Bilateral - 1% to 9% ICA stenosis. Vertebral artery flow is  . Transthoracic echocardiogram  11/2014    EF 50-55%, some wall motion abnormalities noted, grade I diast dysfxn   Family History  Problem Relation Age of Onset  . Lung cancer Father   . Heart disease Father   . Breast cancer Mother   . Congestive Heart Failure Mother   . Diabetes Mother   . Breast cancer Sister   . Heart disease Brother   . COPD Brother    Social History  Substance Use Topics  . Smoking status: Former Smoker -- 2.00 packs/day for 35 years    Types: Cigarettes    Quit date: 09/28/1998  . Smokeless tobacco: Former Systems developer     Comment: as a teenager  . Alcohol Use: No     Comment: quit 1994    Review of Systems 10 Systems reviewed and are negative for acute change except as noted in the HPI.    Allergies  Review of patient's allergies indicates no known allergies.  Home Medications   Prior to Admission medications   Medication Sig Start Date End Date Taking? Authorizing  Provider  allopurinol (ZYLOPRIM) 300 MG tablet Take 1 tablet by mouth  daily Patient taking differently: Take 300 mg by mouth  daily 06/07/15  Yes Tammi Sou, MD  aspirin 81 MG tablet Take 81 mg by mouth daily.     Yes Historical Provider, MD  BRILINTA 90 MG TABS tablet Take 1 tablet by mouth two  times daily Patient taking differently: Take 90 mg by mouth two  times daily 02/27/15  Yes Sueanne Margarita, MD  clotrimazole-betamethasone (LOTRISONE) cream Apply 1 application topically daily. 02/12/15  Yes Historical Provider, MD  CRESTOR 20 MG tablet Take 1 tablet by mouth  daily Patient taking differently: Take 20 mg by mouth  daily 06/07/15  Yes Tammi Sou, MD  desonide (DESOWEN) 0.05 % cream Apply 1 application topically daily. 02/11/15  Yes  Historical Provider, MD  diphenhydrAMINE (BENADRYL) 25 MG tablet Take 50 mg by mouth 3 (three) times daily.   Yes Historical Provider, MD  EPIPEN 2-PAK 0.3 MG/0.3ML SOAJ injection USE UTD PRN FOR SYSTEMIC REACTION 05/20/15  Yes Historical Provider, MD  fluticasone (FLONASE) 50 MCG/ACT nasal spray Place 1 spray into both nostrils daily.   Yes Historical Provider, MD  gabapentin (NEURONTIN) 300 MG capsule Take 300 mg by mouth 4 (four) times daily.    Yes Historical Provider, MD  loratadine (CLARITIN) 10 MG tablet Take 10 mg by mouth daily as needed for allergies. As needed   Yes Historical Provider, MD  metoprolol tartrate (LOPRESSOR) 25 MG tablet Take 1 tablet by mouth two  times daily Patient taking differently: Take 25 mg by mouth two  times daily 07/26/15  Yes Sueanne Margarita, MD  nitroGLYCERIN (NITROSTAT) 0.4 MG SL tablet Place 1 tablet (0.4 mg total) under the tongue every 5 (five) minutes as needed for chest pain. 12/05/14  Yes Rhonda G Barrett, PA-C  Olopatadine HCl 0.6 % SOLN Place 1 puff into the nose 2 (two) times daily.    Yes Historical Provider, MD  omeprazole (PRILOSEC) 20 MG capsule TAKE ONE CAPSULE BY MOUTH TWICE DAILY 30 MINUTES BEFORE BREAKFAST AND DINNER Patient taking differently: TAKE 20 MG BY MOUTH TWICE DAILY 30 MINUTES BEFORE BREAKFAST AND DINNER 05/29/15  Yes Tammi Sou, MD  PREVIDENT 5000 BOOSTER PLUS 1.1 % PSTE Take by mouth at bedtime. APPLY TO TEETH 10/27/14  Yes Historical Provider, MD  sildenafil (VIAGRA) 100 MG tablet Take 1 tablet (100 mg total) by mouth as needed for erectile dysfunction. 11/05/14 07/03/16 Yes Tammi Sou, MD  traMADol (ULTRAM) 50 MG tablet TAKE 1 TABLET BY MOUTH THREE TIMES DAILY AS NEEDED FOR PAIN Patient taking differently: TAKE 50 MG BY MOUTH THREE TIMES DAILY AS NEEDED FOR PAIN 04/15/15  Yes Tammi Sou, MD  triamcinolone cream (KENALOG) 0.1 % Apply 1 application topically daily. 08/18/15  Yes Historical Provider, MD  valsartan (DIOVAN)  160 MG tablet Take 1 and 1/2 tablets by  mouth daily Patient taking differently: Take 240 mg by  mouth daily 02/27/15  Yes Sueanne Margarita, MD   BP 143/92 mmHg  Pulse 64  Temp(Src) 97.7 F (36.5 C) (Oral)  Resp 15  SpO2 100% Physical Exam  Constitutional: He is oriented to person, place, and time. He appears well-developed and well-nourished.  HENT:  Head: Normocephalic and atraumatic.  Eyes: EOM are normal. Pupils are equal, round, and reactive to light.  Neck: Neck supple.  Cardiovascular: Normal rate, regular rhythm, normal heart sounds and intact distal pulses.   Pulmonary/Chest:  Effort normal and breath sounds normal.  Abdominal: Soft. Bowel sounds are normal. He exhibits no distension. There is no tenderness.  Musculoskeletal: Normal range of motion. He exhibits no edema.  Neurological: He is alert and oriented to person, place, and time. He has normal strength. Coordination normal. GCS eye subscore is 4. GCS verbal subscore is 5. GCS motor subscore is 6.  Skin: Skin is warm, dry and intact.  Psychiatric: He has a normal mood and affect.    ED Course  Procedures (including critical care time) Labs Review Labs Reviewed  CBC - Abnormal; Notable for the following:    RDW 16.1 (*)    All other components within normal limits  BASIC METABOLIC PANEL  URINALYSIS, ROUTINE W REFLEX MICROSCOPIC (NOT AT [facility])  CBG MONITORING, ED  Randolm Idol, ED    Imaging Review Dg Chest 2 View  08/23/2015  CLINICAL DATA:  Weakness, cough, dyspnea EXAM: CHEST  2 VIEW COMPARISON:  12/03/2014 FINDINGS: Cardiomediastinal silhouette is stable. No acute infiltrate or pleural effusion. No pulmonary edema. Atherosclerotic calcifications of thoracic aorta. IMPRESSION: No active cardiopulmonary disease. Electronically Signed   By: Lahoma Crocker M.D.   On: 08/23/2015 19:08   I have personally reviewed and evaluated these images and lab results as part of my medical decision-making.   EKG  Interpretation   Date/Time:  Friday August 23 2015 15:16:30 EST Ventricular Rate:  65 PR Interval:  174 QRS Duration: 78 QT Interval:  408 QTC Calculation: 424 R Axis:   42 Text Interpretation:  Normal sinus rhythm Cannot rule out Anterior infarct  , age undetermined Abnormal ECG ECG not diagnostic for Acute Coronary  Syndrome; consider clinical findings no ischemic change. no change from  old Confirmed by Johnney Killian, MD, Jeannie Done [phone]) on 08/23/2015 5:15:27 PM      MDM   Final diagnoses:  Near syncope  Dyspnea  Anginal equivalent [facility])   Patient presents with symptoms of exertional dyspnea and lightheadedness. He reports a near syncopal episode with exertion today. He reports symptoms are similar to prior ischemic type symptoms when he had to have a stent placed. He is pain-free at this time with stable vital signs. Cardiology has been consult within have made arrangements for patient admission and observation.    Charlesetta Shanks, MD 08/23/15 [phone]

## 2015-08-23 NOTE — ED Notes (Signed)
CBG: 113 °

## 2015-08-24 ENCOUNTER — Observation Stay (HOSPITAL_COMMUNITY): Payer: Medicare Other

## 2015-08-24 ENCOUNTER — Encounter (HOSPITAL_COMMUNITY): Payer: Self-pay | Admitting: Nurse Practitioner

## 2015-08-24 DIAGNOSIS — I2510 Atherosclerotic heart disease of native coronary artery without angina pectoris: Secondary | ICD-10-CM | POA: Diagnosis not present

## 2015-08-24 DIAGNOSIS — R0789 Other chest pain: Secondary | ICD-10-CM | POA: Diagnosis not present

## 2015-08-24 DIAGNOSIS — R079 Chest pain, unspecified: Secondary | ICD-10-CM | POA: Diagnosis not present

## 2015-08-24 DIAGNOSIS — R0609 Other forms of dyspnea: Secondary | ICD-10-CM | POA: Diagnosis not present

## 2015-08-24 DIAGNOSIS — R0600 Dyspnea, unspecified: Secondary | ICD-10-CM | POA: Diagnosis not present

## 2015-08-24 DIAGNOSIS — I10 Essential (primary) hypertension: Secondary | ICD-10-CM | POA: Diagnosis not present

## 2015-08-24 DIAGNOSIS — R55 Syncope and collapse: Secondary | ICD-10-CM | POA: Diagnosis not present

## 2015-08-24 LAB — TROPONIN I: Troponin I: <0.03 ng/mL (ref <0.031)

## 2015-08-24 LAB — BASIC METABOLIC PANEL
ANION GAP: 7 (ref 5–15)
BUN: 9 mg/dL (ref 6–20)
CALCIUM: 9 mg/dL (ref 8.9–10.3)
CO2: 25 mmol/L (ref 22–32)
Chloride: 108 mmol/L (ref 101–111)
Creatinine, Ser: 1.05 mg/dL (ref 0.61–1.24)
Glucose, Bld: 105 mg/dL — ABNORMAL HIGH (ref 65–99)
POTASSIUM: 3.5 mmol/L (ref 3.5–5.1)
Sodium: 140 mmol/L (ref 135–145)

## 2015-08-24 LAB — LIPID PANEL
CHOLESTEROL: 140 mg/dL (ref 0–200)
HDL: 40 mg/dL — ABNORMAL LOW (ref >40)
LDL CALC: 52 mg/dL (ref 0–99)
Total CHOL/HDL Ratio: 3.5 RATIO
Triglycerides: 239 mg/dL — ABNORMAL HIGH (ref <150)
VLDL: 48 mg/dL — AB (ref 0–40)

## 2015-08-24 LAB — CBC
HCT: 36.6 % — ABNORMAL LOW (ref 39.0–52.0)
HEMOGLOBIN: 11.9 g/dL — AB (ref 13.0–17.0)
MCH: 27.2 pg (ref 26.0–34.0)
MCHC: 32.5 g/dL (ref 30.0–36.0)
MCV: 83.8 fL (ref 78.0–100.0)
Platelets: 147 10*3/uL — ABNORMAL LOW (ref 150–400)
RBC: 4.37 MIL/uL (ref 4.22–5.81)
RDW: 16.1 % — AB (ref 11.5–15.5)
WBC: 7.3 10*3/uL (ref 4.0–10.5)

## 2015-08-24 LAB — NM MYOCAR MULTI W/SPECT W/WALL MOTION / EF
CSEPED: 0 min
CSEPEDS: 0 s
CSEPEW: 1 METS
CSEPPHR: 73 {beats}/min
MPHR: 148 {beats}/min
Percent HR: 49 %
Rest HR: 73 {beats}/min

## 2015-08-24 MED ORDER — TECHNETIUM TC 99M SESTAMIBI GENERIC - CARDIOLITE
30.0000 | Freq: Once | INTRAVENOUS | Status: AC | PRN
  Administered 2015-08-24: 30 via INTRAVENOUS

## 2015-08-24 MED ORDER — REGADENOSON 0.4 MG/5ML IV SOLN
INTRAVENOUS | Status: AC
  Administered 2015-08-24: 0.4 mg via INTRAVENOUS
  Filled 2015-08-24: qty 5

## 2015-08-24 MED ORDER — TECHNETIUM TC 99M SESTAMIBI GENERIC - CARDIOLITE
10.0000 | Freq: Once | INTRAVENOUS | Status: AC | PRN
  Administered 2015-08-24: 10 via INTRAVENOUS

## 2015-08-24 MED ORDER — REGADENOSON 0.4 MG/5ML IV SOLN
0.4000 mg | Freq: Once | INTRAVENOUS | Status: AC
  Administered 2015-08-24: 0.4 mg via INTRAVENOUS
  Filled 2015-08-24: qty 5

## 2015-08-24 NOTE — Plan of Care (Signed)
Problem: Education: Goal: Knowledge of [place] Education information/materials will improve Outcome: Completed/Met Date Met:  08/24/15 Pt educated on plan of care, medication, procedures, pain scale, and discharge needs.

## 2015-08-24 NOTE — Plan of Care (Signed)
Problem: Activity: Goal: Risk for activity intolerance will decrease Outcome: Completed/Met Date Met:  08/24/15 Pt tolerating activity well. No complaints from pt at this time.

## 2015-08-24 NOTE — Discharge Instructions (Signed)
**PLEASE REMEMBER TO BRING ALL OF YOUR MEDICATIONS TO EACH OF YOUR FOLLOW-UP OFFICE VISITS.   Heart-Healthy Eating Plan Heart-healthy meal planning includes:  Limiting unhealthy fats.  Increasing healthy fats.  Making other small dietary changes. You may need to talk with your doctor or a diet specialist (dietitian) to create an eating plan that is right for you. WHAT TYPES OF FAT SHOULD I CHOOSE?  Choose healthy fats. These include olive oil and canola oil, flaxseeds, walnuts, almonds, and seeds.  Eat more omega-3 fats. These include salmon, mackerel, sardines, tuna, flaxseed oil, and ground flaxseeds. Try to eat fish at least twice each week.  Limit saturated fats.  Saturated fats are often found in animal products, such as meats, butter, and cream.  Plant sources of saturated fats include palm oil, palm kernel oil, and coconut oil.  Avoid foods with partially hydrogenated oils in them. These include stick margarine, some tub margarines, cookies, crackers, and other baked goods. These contain trans fats. WHAT GENERAL GUIDELINES DO I NEED TO FOLLOW?  Check food labels carefully. Identify foods with trans fats or high amounts of saturated fat.  Fill one half of your plate with vegetables and green salads. Eat 4-5 servings of vegetables per day. A serving of vegetables is:  1 cup of raw leafy vegetables.   cup of raw or cooked cut-up vegetables.   cup of vegetable juice.  Fill one fourth of your plate with whole grains. Look for the word "whole" as the first word in the ingredient list.  Fill one fourth of your plate with lean protein foods.  Eat 4-5 servings of fruit per day. A serving of fruit is:  One medium whole fruit.   cup of dried fruit.   cup of fresh, frozen, or canned fruit.   cup of 100% fruit juice.  Eat more foods that contain soluble fiber. These include apples, broccoli, carrots, beans, peas, and barley. Try to get 20-30 g of fiber per  day.  Eat more home-cooked food. Eat less restaurant, buffet, and fast food.  Limit or avoid alcohol.  Limit foods high in starch and sugar.  Avoid fried foods.  Avoid frying your food. Try baking, boiling, grilling, or broiling it instead. You can also reduce fat by:  Removing the skin from poultry.  Removing all visible fats from meats.  Skimming the fat off of stews, soups, and gravies before serving them.  Steaming vegetables in water or broth.  Lose weight if you are overweight.  Eat 4-5 servings of nuts, legumes, and seeds per week:  One serving of dried beans or legumes equals  cup after being cooked.  One serving of nuts equals 1 ounces.  One serving of seeds equals  ounce or one tablespoon.  You may need to keep track of how much salt or sodium you eat. This is especially true if you have high blood pressure. Talk with your doctor or dietitian to get more information. WHAT FOODS CAN I EAT? Grains Breads, including [place], white, pita, wheat, raisin, rye, oatmeal, and [place]. Tortillas that are neither fried nor made with lard or trans fat. Low-fat rolls, including hotdog and hamburger buns and English muffins. Biscuits. Muffins. Waffles. Pancakes. Light popcorn. Whole-grain cereals. Flatbread. Melba toast. Pretzels. Breadsticks. Rusks. Low-fat snacks. Low-fat crackers, including oyster, saltine, matzo, graham, animal, and rye. Rice and pasta, including brown rice and pastas that are made with whole wheat.  Vegetables All vegetables.  Fruits All fruits, but limit coconut. Meats and Other  Protein Sources Lean, well-trimmed beef, veal, pork, and lamb. Chicken and [place] without skin. All fish and shellfish. Wild duck, rabbit, pheasant, and venison. Egg whites or low-cholesterol egg substitutes. Dried beans, peas, lentils, and tofu. Seeds and most nuts. Dairy Low-fat or nonfat cheeses, including ricotta, string, and mozzarella. Skim or 1% milk that is liquid,  powdered, or evaporated. Buttermilk that is made with low-fat milk. Nonfat or low-fat yogurt. Beverages Mineral water. Diet carbonated beverages. Sweets and Desserts Sherbets and fruit ices. Honey, jam, marmalade, jelly, and syrups. Meringues and gelatins. Pure sugar candy, such as hard candy, jelly beans, gumdrops, mints, marshmallows, and small amounts of dark chocolate. [employer] Eat all sweets and desserts in moderation. Fats and Oils Nonhydrogenated (trans-free) margarines. Vegetable oils, including soybean, sesame, sunflower, olive, peanut, safflower, corn, canola, and cottonseed. Salad dressings or mayonnaise made with a vegetable oil. Limit added fats and oils that you use for cooking, baking, salads, and as spreads. Other Cocoa powder. Coffee and tea. All seasonings and condiments. The items listed above may not be a complete list of recommended foods or beverages. Contact your dietitian for more options. WHAT FOODS ARE NOT RECOMMENDED? Grains Breads that are made with saturated or trans fats, oils, or whole milk. Croissants. Butter rolls. Cheese breads. Sweet rolls. Donuts. Buttered popcorn. Chow mein noodles. High-fat crackers, such as cheese or butter crackers. Meats and Other Protein Sources Fatty meats, such as hotdogs, short ribs, sausage, spareribs, bacon, rib eye roast or steak, and mutton. High-fat deli meats, such as salami and bologna. Caviar. Domestic duck and goose. Organ meats, such as kidney, liver, sweetbreads, and heart. Dairy Cream, sour cream, cream cheese, and creamed cottage cheese. Whole-milk cheeses, including blue (bleu), Monterey Jack, [place], [place], American, [place], Swiss, cheddar, [place], and [place]. Whole or 2% milk that is liquid, evaporated, or condensed. Whole buttermilk. Cream sauce or high-fat cheese sauce. Yogurt that is made from whole milk. Beverages Regular sodas and juice drinks with added sugar. Sweets and Desserts Frosting. Pudding.  Cookies. Cakes other than angel food cake. Candy that has milk chocolate or white chocolate, hydrogenated fat, butter, coconut, or unknown ingredients. Buttered syrups. Full-fat ice cream or ice cream drinks. Fats and Oils Gravy that has suet, meat fat, or shortening. Cocoa butter, hydrogenated oils, palm oil, coconut oil, palm kernel oil. These can often be found in baked products, candy, fried foods, nondairy creamers, and whipped toppings. Solid fats and shortenings, including bacon fat, salt pork, lard, and butter. Nondairy cream substitutes, such as coffee creamers and sour cream substitutes. Salad dressings that are made of unknown oils, cheese, or sour cream. The items listed above may not be a complete list of foods and beverages to avoid. Contact your dietitian for more information.   This information is not intended to replace advice given to you by your health care provider. Make sure you discuss any questions you have with your health care provider.   Document Released: 03/15/2012 Document Revised: 10/05/2014 Document Reviewed: 03/08/2014 Elsevier Interactive Patient Education [employer].

## 2015-08-24 NOTE — Plan of Care (Signed)
Problem: Health Behavior/Discharge Planning: Goal: Ability to manage health-related needs will improve Outcome: Completed/Met Date Met:  08/24/15 Pt educated on importance of compliance with medication and treatment plan. Discharge needs assessed for. No concerns at this time.

## 2015-08-24 NOTE — Progress Notes (Signed)
Patient ID: Brandon Haynes, male   DOB: 1941-04-06, 74 y.o.   MRN: [id number]    Subjective:  Denies SSCP, palpitations or Dyspnea Unable to walk on treadmill due to back pain and coordination issues with arms/legs   Objective:  Filed Vitals:   08/23/15 1945 08/23/15 2015 08/23/15 2136 08/24/15 0445  BP: 136/93 143/92 170/88 139/81  Pulse: 68 64 74 59  Temp:   98 F (36.7 C) 97.9 F (36.6 C)  TempSrc:   Oral Oral  Resp: 11 15 12 16   Height:   6\' 2"  (1.88 m)   Weight:   95.8 kg (211 lb 3.2 oz) 93.895 kg (207 lb)  SpO2: 98% 100% 100% 99%    Intake/Output from previous day:  Intake/Output Summary (Last 24 hours) at 08/24/15 0900 Last data filed at 08/23/15 2258  Gross per 24 hour  Intake      0 ml  Output    300 ml  Net   -300 ml    Physical Exam: Affect appropriate Healthy:  appears stated age HEENT: normal Neck supple with no adenopathy JVP normal no bruits no thyromegaly Lungs clear with no wheezing and good diaphragmatic motion Heart:  S1/S2 no murmur, no rub, gallop or click PMI normal Abdomen: benighn, BS positve, no tenderness, no AAA no bruit.  No HSM or HJR Distal pulses intact with no bruits No edema Neuro non-focal Skin warm and dry No muscular weakness   Lab Results: Basic Metabolic Panel:  Recent Labs  08/23/15 1555 08/24/15 0358  NA 139 140  K 4.2 3.5  CL 105 108  CO2 27 25  GLUCOSE 96 105*  BUN 11 9  CREATININE 1.15 1.05  CALCIUM 9.5 9.0   CBC:  Recent Labs  08/23/15 1555 08/24/15 0358  WBC 10.0 7.3  HGB 13.5 11.9*  HCT 42.2 36.6*  MCV 84.6 83.8  PLT 178 147*   Cardiac Enzymes:  Recent Labs  08/23/15 2121 08/24/15 0358  TROPONINI <0.03 <0.03   Fasting Lipid Panel:  Recent Labs  08/24/15 0358  CHOL 140  HDL 40*  LDLCALC 52  TRIG 239*  CHOLHDL 3.5    Imaging: Dg Chest 2 View  08/23/2015  CLINICAL DATA:  Weakness, cough, dyspnea EXAM: CHEST  2 VIEW COMPARISON:  12/03/2014 FINDINGS: Cardiomediastinal silhouette  is stable. No acute infiltrate or pleural effusion. No pulmonary edema. Atherosclerotic calcifications of thoracic aorta. IMPRESSION: No active cardiopulmonary disease. Electronically Signed   By: Lahoma Crocker M.D.   On: 08/23/2015 19:08    Cardiac Studies:  ECG:  11/26  SR rate 58 normal    Telemetry:  NSR no arrhythmia   Echo:  12/04/14  EF 50-55%   Medications:   . allopurinol  300 mg Oral Daily  . aspirin EC  81 mg Oral Daily  . azelastine  1 spray Each Nare BID  . fluticasone  1 spray Each Nare Daily  . gabapentin  300 mg Oral QID  . heparin  5,000 Units Subcutaneous 3 times per day  . irbesartan  225 mg Oral Daily  . metoprolol tartrate  25 mg Oral BID  . pantoprazole  40 mg Oral BID AC  . regadenoson  0.4 mg Intravenous Once  . rosuvastatin  20 mg Oral Daily  . sodium chloride  3 mL Intravenous Q12H  . ticagrelor  90 mg Oral BID  . triamcinolone cream  1 application Topical Daily       Assessment/Plan:  Chest pain:  Resolved  r/o ECG ok  Have ordered lexiscan myovue for this am continue ASA/Brillinta and beta blocker  History of LAD stent And collateralized RCA.  Would only cath if LAD territory ischemia or marked inferior ischemia suggesting insufficient collaterals  Chol:  On statin   HTN:  Continue ARB   Jenkins Rouge 08/24/2015, 9:00 AM

## 2015-08-24 NOTE — Discharge Summary (Signed)
Discharge Summary   Patient ID: Brandon Haynes,  MRN: [id number], DOB/AGE: 1941/02/01 74 y.o.  Admit date: 08/23/2015 Discharge date: 08/24/2015  Primary Care Provider: MCGOWEN,PHILIP H Primary Cardiologist: T. Turner, MD   Discharge Diagnoses    Principal Problem:   Midsternal chest pain  ** Low-risk, non-ischemic Lexiscan Cardiolite this admission. Active Problems:   CAD S/P percutaneous coronary angioplasty   Essential hypertension   Dyspnea on exertion   HYPERCHOLESTEROLEMIA   GOUT   GERD  Allergies No Known Allergies  Diagnostic Studies/Procedures    Lexiscan Cardiolite 11.26.2016  IMPRESSION: 1. No reversible ischemia or infarction. 2. Mild septal hypokinesia.  No LEFT ventricular dilatation. 3. Left ventricular ejection fraction 56% 4. Low-risk stress test findings. _____________   History of Present Illness  74 y/o male with a h/o CAD s/p prior drug-eluting stent placement to the LAD in 11/2014 with known occlusion of the RCA, HTN, HL, GERD, and degenerative disc dzs, who was in his usual state of health until approximately 2-3 days prior to admission, when he began to feel run down.  On 11/23, he had an episode of dizziness and on 11/24, he had an episode of chest pressure that resolved after taking 2 Tums and belching.  On 11/25, he was walking when he developed dyspnea and dizziness associated with mild chest pressure.  Symptoms were similar to prior angina in March of this year.  He called our answering service and was subsequently referred to the ED for evaluation. There, ECG was non-acute and labs/CXR were normal.  He was admitted for further evaluation.  Hospital Course   Consultants: None   Patient ruled out for MI and had no further chest pressure or dyspnea.  Lexiscan Cardiolite was performed this AM and revealed no evidence of ischemia or infarct and normal LV function.  Brandon Haynes has been ambulating without difficulty and will be discharged home  today in good condition. _____________  Discharge Vitals Blood pressure 130/86, pulse 59, temperature 97.9 F (36.6 C), temperature source Oral, resp. rate 16, height 6\' 2"  (1.88 m), weight 207 lb (93.895 kg), SpO2 99 %.  Filed Weights   08/23/15 2136 08/24/15 0445  Weight: 211 lb 3.2 oz (95.8 kg) 207 lb (93.895 kg)    Labs     CBC  Recent Labs  08/23/15 1555 08/24/15 0358  WBC 10.0 7.3  HGB 13.5 11.9*  HCT 42.2 36.6*  MCV 84.6 83.8  PLT 178 [id number]*   Basic Metabolic Panel  Recent Labs  08/23/15 1555 08/24/15 0358  NA 139 140  K 4.2 3.5  CL 105 108  CO2 27 25  GLUCOSE 96 105*  BUN 11 9  CREATININE 1.15 1.05  CALCIUM 9.5 9.0   Cardiac Enzymes  Recent Labs  08/23/15 2121 08/24/15 0358 08/24/15 1003  TROPONINI <0.03 <0.03 <0.03   Fasting Lipid Panel  Recent Labs  08/24/15 0358  CHOL 140  HDL 40*  LDLCALC 52  TRIG 239*  CHOLHDL 3.5   Disposition   Pt is being discharged home today in good condition.  Follow-up Plans & Appointments    Follow-up Information    Follow up with Sueanne Margarita, MD On 09/04/2015.   Specialty:  Cardiology   Why:  2:30 PM   Contact information:   [address] [place] [address] [phone]       Follow up with Tammi Sou, MD On 10/18/2015.   Specialty:  Family Medicine   Why:  8:15 AM  Contact information:   [address] [place] [address] [phone]       Discharge Medications     Medication List    TAKE these medications        allopurinol 300 MG tablet  Commonly known as:  ZYLOPRIM  Take 1 tablet by mouth  daily     aspirin 81 MG tablet  Take 81 mg by mouth daily.     BRILINTA 90 MG Tabs tablet  Generic drug:  ticagrelor  Take 1 tablet by mouth two  times daily     clotrimazole-betamethasone cream  Commonly known as:  LOTRISONE  Apply 1 application topically daily.     CRESTOR 20 MG tablet  Generic drug:  rosuvastatin  Take 1 tablet by mouth  daily       desonide 0.05 % cream  Commonly known as:  DESOWEN  Apply 1 application topically daily.     diphenhydrAMINE 25 MG tablet  Commonly known as:  BENADRYL  Take 50 mg by mouth 3 (three) times daily.     EPIPEN 2-PAK 0.3 mg/0.3 mL Soaj injection  Generic drug:  EPINEPHrine  USE UTD PRN FOR SYSTEMIC REACTION     fluticasone 50 MCG/ACT nasal spray  Commonly known as:  FLONASE  Place 1 spray into both nostrils daily.     gabapentin 300 MG capsule  Commonly known as:  NEURONTIN  Take 300 mg by mouth 4 (four) times daily.     loratadine 10 MG tablet  Commonly known as:  CLARITIN  Take 10 mg by mouth daily as needed for allergies. As needed     metoprolol tartrate 25 MG tablet  Commonly known as:  LOPRESSOR  Take 1 tablet by mouth two  times daily     nitroGLYCERIN 0.4 MG SL tablet  Commonly known as:  NITROSTAT  Place 1 tablet (0.4 mg total) under the tongue every 5 (five) minutes as needed for chest pain.     Olopatadine HCl 0.6 % Soln  Place 1 puff into the nose 2 (two) times daily.     omeprazole 20 MG capsule  Commonly known as:  PRILOSEC  TAKE ONE CAPSULE BY MOUTH TWICE DAILY 30 MINUTES BEFORE BREAKFAST AND DINNER     PREVIDENT 5000 BOOSTER PLUS 1.1 % Pste  Generic drug:  Sodium Fluoride  Take by mouth at bedtime. APPLY TO TEETH     sildenafil 100 MG tablet  Commonly known as:  VIAGRA  Take 1 tablet (100 mg total) by mouth as needed for erectile dysfunction.     traMADol 50 MG tablet  Commonly known as:  ULTRAM  TAKE 1 TABLET BY MOUTH THREE TIMES DAILY AS NEEDED FOR PAIN     triamcinolone cream 0.1 %  Commonly known as:  KENALOG  Apply 1 application topically daily.     valsartan 160 MG tablet  Commonly known as:  DIOVAN  Take 1 and 1/2 tablets by  mouth daily       Outstanding Labs/Studies   None  Duration of Discharge Encounter   Greater than 30 minutes including physician time.  Signed, Murray Hodgkins NP 08/24/2015, 12:17  PM

## 2015-08-24 NOTE — Plan of Care (Signed)
Problem: Physical Regulation: Goal: Will remain free from infection Outcome: Completed/Met Date Met:  08/24/15 Pt educated on infection prevention. Pt verbalized understanding.

## 2015-08-24 NOTE — Plan of Care (Signed)
Problem: Safety: Goal: Ability to remain free from injury will improve Outcome: Completed/Met Date Met:  08/24/15 Pt educated on safety measures put into place. Pt verbalized understanding.

## 2015-08-26 ENCOUNTER — Encounter (HOSPITAL_COMMUNITY): Payer: Self-pay | Admitting: Family Medicine

## 2015-09-04 ENCOUNTER — Ambulatory Visit (INDEPENDENT_AMBULATORY_CARE_PROVIDER_SITE_OTHER): Payer: Medicare Other | Admitting: Cardiology

## 2015-09-04 ENCOUNTER — Encounter: Payer: Self-pay | Admitting: Cardiology

## 2015-09-04 VITALS — BP 118/52 | HR 64 | Ht 74.0 in | Wt 215.0 lb

## 2015-09-04 DIAGNOSIS — E7800 Pure hypercholesterolemia, unspecified: Secondary | ICD-10-CM

## 2015-09-04 DIAGNOSIS — I10 Essential (primary) hypertension: Secondary | ICD-10-CM

## 2015-09-04 DIAGNOSIS — Z9861 Coronary angioplasty status: Secondary | ICD-10-CM

## 2015-09-04 DIAGNOSIS — I2510 Atherosclerotic heart disease of native coronary artery without angina pectoris: Secondary | ICD-10-CM

## 2015-09-04 NOTE — Patient Instructions (Signed)

## 2015-09-04 NOTE — Addendum Note (Signed)
Addended by: Fransico Him R on: 09/04/2015 06:40 PM   Modules accepted: Miquel Dunn

## 2015-09-04 NOTE — Progress Notes (Signed)
Cardiology Office Note   Date:  09/04/2015   ID:  Brandon, Haynes 08-24-1941, MRN [id number]  PCP:  Brandon Sou, MD    Chief Complaint  Patient presents with  . Coronary Artery Disease  . Hypertension      History of Present Illness: Brandon Haynes is a 74 y.o. male with no history of 2 vessel  ASCAD with a 99% LAD and an occluded RCA. The RCA was felt well collateralized and medical therapy was recommended. Dr. Martinique completed percutaneous intervention to the LAD and he tolerated the procedure well. LV function was 55%. Echocardiogram revealed akinesis of the basal-midinferoseptal myocardium. There wasakinesis of the mid-apicalanteroseptal myocardium.   Patient presents today for follow-up. He was in the hospital late November with some chest discomfort that resolved with TUMS and belching.  He ruled out for MI and nuclear stress test showed no ischemia.  He reports doing quite well since then with no angina.  He has been active at home without any discomfort.  He is taking Brilinta as directed. He denies dizziness, PND, lower extremity edema, claudication. He occasionally has a feeling that he has to take a deep breath.      Past Medical History  Diagnosis Date  . Hypertension   . Hypercholesteremia   . Hearing loss   . Allergic rhinitis     allergy shots via [place]  . Peripheral vascular disease ([place])   . GERD (gastroesophageal reflux disease)   . Hx of colonic polyps   . DDD (degenerative disc disease), lumbar   . Gout   . Chronic low back pain   . Anxiety   . History  of basal cell carcinoma   . Pleural thickening     chronic  . 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 CAD, multiple vessel     a. CP/near-syncope 11/2014 s/p DES to MLAD, occluded RCA after takeoff of RV marginal with collaterals treated medically, otherwise mild nonobstructive disease. EF 55%;  b. 07/2015 Lexiscan CL: EF 56%, no ischemia/infarct.    Past  Surgical History  Procedure Laterality Date  . Back injection  oct 2011, 07/2010, 12/2012    no surgery  . Shoulder injection  feb 2014  . Rotator cuff repair  09/2013    left  . Cardiovascular stress test  05/2009; 07/2015    negative/normal.  Normal LV fxn  . Colonoscopy  08/2008    repeat 7 yrs  . Left heart catheterization with coronary angiogram N/A 12/04/2014    Procedure: LEFT HEART CATHETERIZATION WITH CORONARY ANGIOGRAM;  Surgeon: Jolaine Artist, MD; LAD 99% then aneurysmal, OM1 40%, CFX 50%, RCA 40%, 50% then occluded  . Coronary angioplasty with stent placement  12/04/2014    4.0 x 12 mm Synergy stent to the mid LAD  . Carotid dopplers  11/2014    Bilateral - 1% to 9% ICA stenosis. Vertebral artery flow is  . Transthoracic echocardiogram  11/2014    EF 50-55%, some wall motion abnormalities noted, grade I diast dysfxn     Current Outpatient Prescriptions  Medication Sig Dispense Refill  . allopurinol (ZYLOPRIM) 300 MG tablet Take 1 tablet by mouth  daily (Patient taking differently: Take 300 mg by mouth  daily) 90 tablet 1  . aspirin 81 MG tablet Take 81 mg by mouth daily.      Marland Kitchen BRILINTA 90 MG TABS tablet Take  1 tablet by mouth two  times daily (Patient taking differently: Take 90 mg by mouth two  times daily) 180 tablet 1  . clotrimazole-betamethasone (LOTRISONE) cream Apply 1 application topically daily.  1  . CRESTOR 20 MG tablet Take 1 tablet by mouth  daily (Patient taking differently: Take 20 mg by mouth  daily) 90 tablet 1  . desonide (DESOWEN) 0.05 % cream Apply 1 application topically daily.  1  . diphenhydrAMINE (BENADRYL) 25 MG tablet Take 50 mg by mouth 3 (three) times daily.    . fluticasone (FLONASE) 50 MCG/ACT nasal spray Place 1 spray into both nostrils daily.    Marland Kitchen gabapentin (NEURONTIN) 300 MG capsule Take 300 mg by mouth 4 (four) times daily.     Marland Kitchen loratadine (CLARITIN) 10 MG tablet Take 10 mg by mouth daily as needed for allergies. As needed    .  metoprolol tartrate (LOPRESSOR) 25 MG tablet Take 1 tablet by mouth two  times daily (Patient taking differently: Take 25 mg by mouth two  times daily) 180 tablet 0  . nitroGLYCERIN (NITROSTAT) 0.4 MG SL tablet Place 1 tablet (0.4 mg total) under the tongue every 5 (five) minutes as needed for chest pain. 25 tablet 3  . Olopatadine HCl 0.6 % SOLN Place 1 puff into the nose 2 (two) times daily.     Marland Kitchen omeprazole (PRILOSEC) 20 MG capsule TAKE ONE CAPSULE BY MOUTH TWICE DAILY 30 MINUTES BEFORE BREAKFAST AND DINNER (Patient taking differently: TAKE 20 MG BY MOUTH TWICE DAILY 30 MINUTES BEFORE BREAKFAST AND DINNER) 180 capsule 1  . PREVIDENT 5000 BOOSTER PLUS 1.1 % PSTE Take by mouth at bedtime. APPLY TO TEETH  1  . traMADol (ULTRAM) 50 MG tablet TAKE 1 TABLET BY MOUTH THREE TIMES DAILY AS NEEDED FOR PAIN (Patient taking differently: TAKE 50 MG BY MOUTH THREE TIMES DAILY AS NEEDED FOR PAIN) 90 tablet 5  . triamcinolone cream (KENALOG) 0.1 % Apply 1 application topically daily.  3  . valsartan (DIOVAN) 160 MG tablet Take 1 and 1/2 tablets by  mouth daily (Patient taking differently: Take 240 mg by  mouth daily) 135 tablet 5  . EPIPEN 2-PAK 0.3 MG/0.3ML SOAJ injection USE UTD PRN FOR SYSTEMIC REACTION  1  . sildenafil (VIAGRA) 100 MG tablet Take 1 tablet (100 mg total) by mouth as needed for erectile dysfunction. (Patient not taking: Reported on 09/04/2015) 10 tablet 6   No current facility-administered medications for this visit.    Allergies:   Review of patient's allergies indicates no known allergies.    Social History:  The patient  reports that he quit smoking about 16 years ago. His smoking use included Cigarettes. He has a 70 pack-year smoking history. He has quit using smokeless tobacco. He reports that he does not drink alcohol or use illicit drugs.   Family History:  The patient's family history includes Breast cancer in his mother and sister; COPD in his brother; Congestive Heart Failure in his  mother; Diabetes in his mother; Heart disease in his brother and father; Lung cancer in his father.    ROS:  Please see the history of present illness.   Otherwise, review of systems are positive for none.   All other systems are reviewed and negative.    PHYSICAL EXAM: VS:  BP 118/52 mmHg  Pulse 64  Ht 6\' 2"  (1.88 m)  Wt 215 lb (97.523 kg)  BMI 27.59 kg/m2 , BMI Body mass index is 27.59 kg/(m^2). GEN: Well  nourished, well developed, in no acute distress HEENT: normal Neck: no JVD, carotid bruits, or masses Cardiac: RRR; no murmurs, rubs, or gallops,no edema  Respiratory:  clear to auscultation bilaterally, normal work of breathing GI: soft, nontender, nondistended, + BS MS: no deformity or atrophy Skin: warm and dry, no rash Neuro:  Strength and sensation are intact Psych: euthymic mood, full affect   EKG:  EKG is not ordered today.    Recent Labs: 03/06/2015: ALT 12 08/24/2015: BUN 9; Creatinine, Ser 1.05; Hemoglobin 11.9*; Platelets 147*; Potassium 3.5; Sodium 140    Lipid Panel    Component Value Date/Time   CHOL 140 08/24/2015 0358   TRIG 239* 08/24/2015 0358   HDL 40* 08/24/2015 0358   CHOLHDL 3.5 08/24/2015 0358   VLDL 48* 08/24/2015 0358   LDLCALC 52 08/24/2015 0358      Wt Readings from Last 3 Encounters:  09/04/15 215 lb (97.523 kg)  08/24/15 207 lb (93.895 kg)  04/18/15 212 lb (96.163 kg)    ASSESSMENT AND PLAN:  1. ASCAD with PCI of 99% stenosed LAD and chronically occluded RCA with collateral flow s/p PCI of LAD now on DAPT with ASA and Brilinta.  Recent lexiscan myoview for atypical CP showed no ischemia.  He has had a rash on his back and saw dermatology and was given steroid cream that has dried the rash out with no new rash occurring.  If it reappears may need to consider changing Brilinta to Plavix.  2. HTN - controlled on Diovan/BB 3. Dyslipidemia - continue statin. LDL at goal at 52 (07/2015).  Continue statin.      Current medicines are  reviewed at length with the patient today.  The patient does not have concerns regarding medicines.  The following changes have been made:  no change  Labs/ tests ordered today: See above Assessment and Plan No orders of the defined types were placed in this encounter.     Disposition:   FU with me in 6 months  Signed, Sueanne Margarita, MD  09/04/2015 2:39 PM    [facility] [address], [place], [place]  [address] Phone: [phone]; Fax: [phone]

## 2015-09-29 DIAGNOSIS — D509 Iron deficiency anemia, unspecified: Secondary | ICD-10-CM

## 2015-09-29 HISTORY — DX: Iron deficiency anemia, unspecified: D50.9

## 2015-10-07 ENCOUNTER — Other Ambulatory Visit: Payer: Self-pay | Admitting: Family Medicine

## 2015-10-07 ENCOUNTER — Other Ambulatory Visit: Payer: Self-pay | Admitting: Cardiology

## 2015-10-07 NOTE — Telephone Encounter (Signed)
LOV: 04/18/15 NOV: 10/18/15  RF request for allopurinol Last written: 06/07/15 #90 w/ 1RF  RF request for rosuvastatin Last written: 06/07/15 #90 w/ 1RF

## 2015-10-10 DIAGNOSIS — J3089 Other allergic rhinitis: Secondary | ICD-10-CM | POA: Diagnosis not present

## 2015-10-10 DIAGNOSIS — J301 Allergic rhinitis due to pollen: Secondary | ICD-10-CM | POA: Diagnosis not present

## 2015-10-16 ENCOUNTER — Other Ambulatory Visit: Payer: Self-pay | Admitting: Family Medicine

## 2015-10-16 DIAGNOSIS — J301 Allergic rhinitis due to pollen: Secondary | ICD-10-CM | POA: Diagnosis not present

## 2015-10-16 DIAGNOSIS — J3089 Other allergic rhinitis: Secondary | ICD-10-CM | POA: Diagnosis not present

## 2015-10-16 NOTE — Telephone Encounter (Signed)
RF request for tramadol LOV: 04/18/15 Next ov: 10/18/15 Last written: 04/15/15 #90 w/ 5RF  Please advise. Thanks.

## 2015-10-17 NOTE — Telephone Encounter (Signed)
Rx faxed

## 2015-10-18 ENCOUNTER — Encounter: Payer: Self-pay | Admitting: Gastroenterology

## 2015-10-18 ENCOUNTER — Encounter: Payer: Self-pay | Admitting: Family Medicine

## 2015-10-18 ENCOUNTER — Ambulatory Visit (INDEPENDENT_AMBULATORY_CARE_PROVIDER_SITE_OTHER): Payer: Medicare HMO | Admitting: Family Medicine

## 2015-10-18 VITALS — BP 118/81 | HR 72 | Temp 98.0°F | Resp 16 | Ht 74.25 in | Wt 210.8 lb

## 2015-10-18 DIAGNOSIS — Z0000 Encounter for general adult medical examination without abnormal findings: Secondary | ICD-10-CM | POA: Diagnosis not present

## 2015-10-18 DIAGNOSIS — H9190 Unspecified hearing loss, unspecified ear: Secondary | ICD-10-CM

## 2015-10-18 DIAGNOSIS — R7303 Prediabetes: Secondary | ICD-10-CM | POA: Diagnosis not present

## 2015-10-18 DIAGNOSIS — Z131 Encounter for screening for diabetes mellitus: Secondary | ICD-10-CM | POA: Diagnosis not present

## 2015-10-18 NOTE — Progress Notes (Signed)
The patient is here for annual Medicare wellness examination and management of other chronic and acute problems. Other problems discussed today: none   AWV DATA The risk factors are reflected in the social history.  The roster of all physicians providing medical care to patient is listed in the Snapshot section of the chart.  Activities of daily living:  The patient is 100% independent in all ADLs: dressing, toileting, feeding as well as independent mobility.  Home safety : The patient has smoke detectors in the home. They wear seatbelts.  No firearms at home ( firearms are present in the home, kept in a safe fashion). There is no violence in the home.   There is no risks for hepatitis, STDs or HIV. There is no history of blood transfusion. They have no travel history to infectious disease endemic areas of the world.  The patient has seen their dentist in the last six month (Dr. Ahmed Prima in [place]). They have not seen their eye doctor in the last year. They do have mild hearing difficulty chronically and have not had audiologic testing in the last year.  They do not  have excessive sun exposure. Discussed the need for sun protection: hats, long sleeves and use of sunscreen if there is significant sun exposure.   Diet: the importance of a healthy diet is discussed. They do have a fairly healthy diet.  Minimal beef, lots of fresh vegetables.  The patient has a regular exercise program: some light stretching with light weights in cold weather.  In warm weather he gets exercise working on his property.  The benefits of regular aerobic exercise were discussed.  Depression screen: there are no signs or vegative symptoms of depression- irritability, change in appetite, anhedonia, sadness/tearfullness.  Cognitive assessment: the patient manages all their financial and personal affairs and is actively engaged. They could relate day,date,year and events; recalled 3/3 objects at 3 minutes; performed  clock-face test normally.  The following portions of the patient's history were reviewed and updated as appropriate: allergies, current medications, past family history, past medical history,  past surgical history, past social history and problem list.  Vision, hearing, body mass index were assessed and reviewed.  BMI 27.9. Wears glasses,has no vision complaints. Audiogram done in office today:  Hearing Screening   Method: Audiometry   125Hz  250Hz  500Hz  1000Hz  2000Hz  4000Hz  8000Hz   Right ear:   20 25 50 55   Left ear:   20 25 35 55    During the course of the visit the patient was educated and counseled about appropriate screening and preventive services including :  Annual wellness visit  diabetes screening colorectal cancer screening recommended immunizations (influenza, pneumococcal, Hep B) Bone mass measurement Counseling to prevent tobacco use Depression screening Glaucoma screening Hepatitis C virus screening HIV virus screening Lung cancer screening Medical nutrition therapy Prostate cancer screening Screening mammography Screening pap tests, pelvic exam, and clinical breast exam Ultrasound screening for AAA  A written plan of action regarding the above screening and preventative services was given to the patient today.  He will make appt for eye exam with his eye MD. We will do HbA1c for diabetes screening today. He will be getting a colonoscopy for colon cancer screening approx April this year after he is taken off of his Brilinta. We discussed prostate cancer screening and decided we would do no further DRE's or PSA testing. We did an audiogram today and this showed mild high frequency hearing loss.  He declined audiologist referral  at this time.

## 2015-10-18 NOTE — Progress Notes (Signed)
Pre visit review using our clinic review tool, if applicable. No additional management support is needed unless otherwise documented below in the visit note. 

## 2015-10-18 NOTE — Patient Instructions (Signed)
He will make appt for eye exam with his eye MD. We will do HbA1c for diabetes screening today. He will be getting a colonoscopy for colon cancer screening approx April this year after he is taken off of his Brilinta. We discussed prostate cancer screening and decided we would do no further DRE's or PSA testing. We did an audiogram today and this showed mild high frequency hearing loss.  He declined audiologist referral at this time.

## 2015-10-19 LAB — HEMOGLOBIN A1C
Hgb A1c MFr Bld: 6 % — ABNORMAL HIGH (ref <5.7)
MEAN PLASMA GLUCOSE: 126 mg/dL — AB (ref <117)

## 2015-10-20 ENCOUNTER — Encounter: Payer: Self-pay | Admitting: Family Medicine

## 2015-10-23 DIAGNOSIS — J3089 Other allergic rhinitis: Secondary | ICD-10-CM | POA: Diagnosis not present

## 2015-10-23 DIAGNOSIS — J301 Allergic rhinitis due to pollen: Secondary | ICD-10-CM | POA: Diagnosis not present

## 2015-10-23 DIAGNOSIS — L239 Allergic contact dermatitis, unspecified cause: Secondary | ICD-10-CM | POA: Diagnosis not present

## 2015-10-25 ENCOUNTER — Encounter: Payer: Self-pay | Admitting: Gastroenterology

## 2015-10-30 ENCOUNTER — Encounter: Payer: Self-pay | Admitting: Podiatry

## 2015-10-30 ENCOUNTER — Ambulatory Visit (INDEPENDENT_AMBULATORY_CARE_PROVIDER_SITE_OTHER): Payer: Medicare HMO | Admitting: Podiatry

## 2015-10-30 DIAGNOSIS — M79673 Pain in unspecified foot: Secondary | ICD-10-CM

## 2015-10-30 DIAGNOSIS — B351 Tinea unguium: Secondary | ICD-10-CM | POA: Diagnosis not present

## 2015-10-30 NOTE — Progress Notes (Signed)
Patient ID: Brandon Haynes, male   DOB: 01/27/1941, 74 y.o.   MRN: [id number] Complaint:  Visit Type: Patient returns to my office for continued preventative foot care services. Complaint: Patient states" my nails have grown long and thick and become painful to walk and wear shoes" . The patient presents for preventative foot care services. No changes to ROS.  He has been applying topical fungal medicine and he says his toes are better.  Podiatric Exam: Vascular: dorsalis pedis and posterior tibial pulses are palpable bilateral. Capillary return is immediate. Temperature gradient is WNL. Skin turgor WNL  Sensorium: Normal Semmes Weinstein monofilament test. Normal tactile sensation bilaterally. Nail Exam: Pt has thick disfigured discolored nails with subungual debris noted bilateral entire nail hallux through fifth toenails Ulcer Exam: There is no evidence of ulcer or pre-ulcerative changes or infection. Orthopedic Exam: Muscle tone and strength are WNL. No limitations in general ROM. No crepitus or effusions noted. Foot type and digits show no abnormalities. Bony prominences are unremarkable.Severe arthritis 1st MPJ B/L and hammer toe second right foot. Skin: No Porokeratosis. No infection or ulcers  Diagnosis:  Onychomycosis, , Pain in right toe, pain in left toes  Treatment & Plan Procedures and Treatment: Consent by patient was obtained for treatment procedures. The patient understood the discussion of treatment and procedures well. All questions were answered thoroughly reviewed. Debridement of mycotic and hypertrophic toenails, 1 through 5 bilateral and clearing of subungual debris. No ulceration, no infection noted.  Return Visit-Office Procedure: Patient instructed to return to the office for a follow up visit 3 months for continued evaluation and treatment. 

## 2015-10-31 DIAGNOSIS — J3089 Other allergic rhinitis: Secondary | ICD-10-CM | POA: Diagnosis not present

## 2015-10-31 DIAGNOSIS — J301 Allergic rhinitis due to pollen: Secondary | ICD-10-CM | POA: Diagnosis not present

## 2015-11-01 ENCOUNTER — Telehealth: Payer: Self-pay | Admitting: *Deleted

## 2015-11-01 NOTE — Telephone Encounter (Signed)
Pt LMOM on 11/01/15 at 11:30am requesting results form his A1c done on 10/18/15. I called pts home phone and left detailed message, okay per DPR.

## 2015-11-07 DIAGNOSIS — J301 Allergic rhinitis due to pollen: Secondary | ICD-10-CM | POA: Diagnosis not present

## 2015-11-07 DIAGNOSIS — J3089 Other allergic rhinitis: Secondary | ICD-10-CM | POA: Diagnosis not present

## 2015-11-08 DIAGNOSIS — J301 Allergic rhinitis due to pollen: Secondary | ICD-10-CM | POA: Diagnosis not present

## 2015-11-08 DIAGNOSIS — J3089 Other allergic rhinitis: Secondary | ICD-10-CM | POA: Diagnosis not present

## 2015-11-15 DIAGNOSIS — J301 Allergic rhinitis due to pollen: Secondary | ICD-10-CM | POA: Diagnosis not present

## 2015-11-15 DIAGNOSIS — B351 Tinea unguium: Secondary | ICD-10-CM

## 2015-11-15 DIAGNOSIS — J3089 Other allergic rhinitis: Secondary | ICD-10-CM | POA: Diagnosis not present

## 2015-11-25 ENCOUNTER — Other Ambulatory Visit: Payer: Self-pay | Admitting: Family Medicine

## 2015-11-25 MED ORDER — OMEPRAZOLE 20 MG PO CPDR: DELAYED_RELEASE_CAPSULE | ORAL | Status: DC

## 2015-11-25 MED ORDER — ROSUVASTATIN CALCIUM 20 MG PO TABS: ORAL_TABLET | ORAL | Status: DC

## 2015-11-25 NOTE — Telephone Encounter (Signed)
Patient called to let you know that his all his Rx needs to be faxed to [facility] [phone].

## 2015-11-25 NOTE — Telephone Encounter (Signed)
I gave 90 day supply of allopurinol with 1 additional RF in January of this year already. I gave 1 mo supply of tramadol with 5 additional RFs in January of this year already. I won't be authorizing RF's of these meds again at this time.-thx

## 2015-11-25 NOTE — Telephone Encounter (Signed)
Spoke to pt he gave me the names of the medications he needed sent to [facility] order.   LOV: 10/18/15 NOV: None  RF request for allopurinol - please advise. Thanks.  Last written: 10/07/15 #90 w/ 1RF  RF request for tramadol - please advise. Thanks.  Last written: 10/16/15 #90 w/ 5RF  RF request for crestor- Rx sent Last written: 10/07/15 #90 w/ 3RF  RF request for omeprazole - Rx sent Last written: 05/29/15 #180 w/ 1RF

## 2015-11-26 ENCOUNTER — Other Ambulatory Visit: Payer: Self-pay

## 2015-11-26 ENCOUNTER — Telehealth: Payer: Self-pay | Admitting: Cardiology

## 2015-11-26 DIAGNOSIS — J301 Allergic rhinitis due to pollen: Secondary | ICD-10-CM | POA: Diagnosis not present

## 2015-11-26 DIAGNOSIS — J3089 Other allergic rhinitis: Secondary | ICD-10-CM | POA: Diagnosis not present

## 2015-11-26 MED ORDER — ALLOPURINOL 300 MG PO TABS: ORAL_TABLET | ORAL | Status: DC

## 2015-11-26 MED ORDER — TRAMADOL HCL 50 MG PO TABS: 50.0000 mg | ORAL_TABLET | Freq: Three times a day (TID) | ORAL | Status: DC | PRN

## 2015-11-26 NOTE — Telephone Encounter (Signed)
OK, pls cancel the January rx's. I'll to the [facility] order rx's.

## 2015-11-26 NOTE — Telephone Encounter (Signed)
The refills given in January were sent to [facility]. I can call to cancel those refills if need be. Pt has to use [facility] order pharmacy per insurance request.

## 2015-11-26 NOTE — Telephone Encounter (Signed)
Rx for tramadol faxed to [facility]. Spoke to Sarah at [facility] and she cancelled remaining refills for both allopurinol and tramadol.

## 2015-11-26 NOTE — Telephone Encounter (Signed)
Received walk-in form requesting refills of Metoprolol and Valsartan sent to [facility] . The note also st, "I have enough Brilinta to last through March. I want to get off Brilinta because of skin rash. If I need to stay on a blood thinner, please prescribe another type."  Left message to call back regarding rash.

## 2015-11-26 NOTE — Telephone Encounter (Signed)
Walk in pt form-note left gave to [facility]

## 2015-11-27 NOTE — Telephone Encounter (Signed)
Follow up   returning rn call

## 2015-11-27 NOTE — Telephone Encounter (Signed)
Ok to change to plavix 75mg  daily from [facility]

## 2015-11-27 NOTE — Telephone Encounter (Signed)
Patient st he has had a skin rash for about 3 months and blames it on Brilinta.  The rash is a "bumpy breakout that gets red and itches." He st he has seen dermatology a few times since the breakouts started and was given topical creams.  He st the creams work for a while and then the breakouts start again. He said the dermatologist told him he did not know what it was, but was likely a medication reaction.  Patient believes it is from [place] because it's the last medication he started. He requests a medication change from Brilinta.   To Dr. Radford Pax.

## 2015-11-28 NOTE — Telephone Encounter (Signed)
? 

## 2015-11-28 NOTE — Telephone Encounter (Signed)
Informed pt that Dr. Radford Pax said ok to change to Plavix 75mg  QD from [place]. Verified pharmacy with pt. Pt verbalized understanding and was in agreement with this plan.  Warning came up in regards to pt being on Omeprazole. Spoke with Jinny Blossom, our pharmacist and she states that Protonix is a good replacement for Omeprazole. Will forward to Dr. Radford Pax for review and advisement due to interaction with these meds.

## 2015-11-29 MED ORDER — CLOPIDOGREL BISULFATE 75 MG PO TABS: 75.0000 mg | ORAL_TABLET | Freq: Every day | ORAL | Status: DC

## 2015-11-29 MED ORDER — PANTOPRAZOLE SODIUM 40 MG PO TBEC: 40.0000 mg | DELAYED_RELEASE_TABLET | Freq: Every day | ORAL | Status: DC

## 2015-11-29 NOTE — Telephone Encounter (Signed)
Spoke with Dr. Radford Pax to clarify dose and she said Protonix 40mg  QD.

## 2015-11-29 NOTE — Telephone Encounter (Signed)
Agree Protonix 40mg  daily

## 2015-11-29 NOTE — Telephone Encounter (Signed)
Spoke with pt and informed him that we need to d/c his Omeprazole as well since he will be taking the Plavix. Advised pt to start Protonix 40mg  once daily. Pt verbalized understanding and was in agreement with this plan.

## 2015-12-04 ENCOUNTER — Other Ambulatory Visit: Payer: Self-pay | Admitting: *Deleted

## 2015-12-04 MED ORDER — VALSARTAN 160 MG PO TABS: ORAL_TABLET | ORAL | Status: DC

## 2015-12-04 MED ORDER — METOPROLOL TARTRATE 25 MG PO TABS
ORAL_TABLET | ORAL | Status: DC
Start: 2015-12-04 — End: 2016-11-09

## 2015-12-08 ENCOUNTER — Other Ambulatory Visit: Payer: Self-pay | Admitting: Family Medicine

## 2015-12-11 DIAGNOSIS — J3089 Other allergic rhinitis: Secondary | ICD-10-CM | POA: Diagnosis not present

## 2015-12-11 DIAGNOSIS — J301 Allergic rhinitis due to pollen: Secondary | ICD-10-CM | POA: Diagnosis not present

## 2015-12-17 DIAGNOSIS — J301 Allergic rhinitis due to pollen: Secondary | ICD-10-CM | POA: Diagnosis not present

## 2015-12-17 DIAGNOSIS — J3089 Other allergic rhinitis: Secondary | ICD-10-CM | POA: Diagnosis not present

## 2015-12-18 ENCOUNTER — Encounter: Payer: Self-pay | Admitting: Gastroenterology

## 2015-12-20 DIAGNOSIS — J3089 Other allergic rhinitis: Secondary | ICD-10-CM | POA: Diagnosis not present

## 2015-12-20 DIAGNOSIS — J301 Allergic rhinitis due to pollen: Secondary | ICD-10-CM | POA: Diagnosis not present

## 2016-01-01 DIAGNOSIS — J3089 Other allergic rhinitis: Secondary | ICD-10-CM | POA: Diagnosis not present

## 2016-01-01 DIAGNOSIS — J301 Allergic rhinitis due to pollen: Secondary | ICD-10-CM | POA: Diagnosis not present

## 2016-01-03 DIAGNOSIS — J3089 Other allergic rhinitis: Secondary | ICD-10-CM | POA: Diagnosis not present

## 2016-01-03 DIAGNOSIS — J301 Allergic rhinitis due to pollen: Secondary | ICD-10-CM | POA: Diagnosis not present

## 2016-01-13 DIAGNOSIS — J3089 Other allergic rhinitis: Secondary | ICD-10-CM | POA: Diagnosis not present

## 2016-01-13 DIAGNOSIS — J301 Allergic rhinitis due to pollen: Secondary | ICD-10-CM | POA: Diagnosis not present

## 2016-01-16 DIAGNOSIS — J3089 Other allergic rhinitis: Secondary | ICD-10-CM | POA: Diagnosis not present

## 2016-01-16 DIAGNOSIS — J301 Allergic rhinitis due to pollen: Secondary | ICD-10-CM | POA: Diagnosis not present

## 2016-01-21 DIAGNOSIS — J301 Allergic rhinitis due to pollen: Secondary | ICD-10-CM | POA: Diagnosis not present

## 2016-01-21 DIAGNOSIS — J3089 Other allergic rhinitis: Secondary | ICD-10-CM | POA: Diagnosis not present

## 2016-01-23 ENCOUNTER — Ambulatory Visit (INDEPENDENT_AMBULATORY_CARE_PROVIDER_SITE_OTHER): Payer: Medicare HMO | Admitting: Podiatry

## 2016-01-23 ENCOUNTER — Encounter: Payer: Self-pay | Admitting: Podiatry

## 2016-01-23 DIAGNOSIS — J3089 Other allergic rhinitis: Secondary | ICD-10-CM | POA: Diagnosis not present

## 2016-01-23 DIAGNOSIS — B351 Tinea unguium: Secondary | ICD-10-CM

## 2016-01-23 DIAGNOSIS — M79673 Pain in unspecified foot: Secondary | ICD-10-CM

## 2016-01-23 NOTE — Progress Notes (Signed)
Patient ID: Brandon Haynes, male   DOB: 11/21/1940, 75 y.o.   MRN: [id number] Complaint:  Visit Type: Patient returns to my office for continued preventative foot care services. Complaint: Patient states" my nails have grown long and thick and become painful to walk and wear shoes" . The patient presents for preventative foot care services. No changes to ROS.  He has been applying topical fungal medicine and he says his toes are better.  Podiatric Exam: Vascular: dorsalis pedis and posterior tibial pulses are palpable bilateral. Capillary return is immediate. Temperature gradient is WNL. Skin turgor WNL  Sensorium: Normal Semmes Weinstein monofilament test. Normal tactile sensation bilaterally. Nail Exam: Pt has thick disfigured discolored nails with subungual debris noted bilateral entire nail hallux through fifth toenails Ulcer Exam: There is no evidence of ulcer or pre-ulcerative changes or infection. Orthopedic Exam: Muscle tone and strength are WNL. No limitations in general ROM. No crepitus or effusions noted. Foot type and digits show no abnormalities. Bony prominences are unremarkable.Severe arthritis 1st MPJ B/L and hammer toe second right foot. Skin: No Porokeratosis. No infection or ulcers  Diagnosis:     Onychomycosis, , Pain in right toe, pain in left toes  Treatment & Plan Procedures and Treatment: Consent by patient was obtained for treatment procedures. The patient understood the discussion of treatment and procedures well. All questions were answered thoroughly reviewed. Debridement of mycotic and hypertrophic toenails, 1 through 5 bilateral and clearing of subungual debris. No ulceration, no infection noted.  Return Visit-Office Procedure: Patient instructed to return to the office for a follow up visit 3 months for continued evaluation and treatment.    Samhita Kretsch DPM 

## 2016-01-28 DIAGNOSIS — J3089 Other allergic rhinitis: Secondary | ICD-10-CM | POA: Diagnosis not present

## 2016-01-28 DIAGNOSIS — J301 Allergic rhinitis due to pollen: Secondary | ICD-10-CM | POA: Diagnosis not present

## 2016-01-31 DIAGNOSIS — J3089 Other allergic rhinitis: Secondary | ICD-10-CM | POA: Diagnosis not present

## 2016-01-31 DIAGNOSIS — J301 Allergic rhinitis due to pollen: Secondary | ICD-10-CM | POA: Diagnosis not present

## 2016-02-04 DIAGNOSIS — J301 Allergic rhinitis due to pollen: Secondary | ICD-10-CM | POA: Diagnosis not present

## 2016-02-04 DIAGNOSIS — J3089 Other allergic rhinitis: Secondary | ICD-10-CM | POA: Diagnosis not present

## 2016-02-10 ENCOUNTER — Encounter: Payer: Self-pay | Admitting: Gastroenterology

## 2016-02-10 ENCOUNTER — Ambulatory Visit (INDEPENDENT_AMBULATORY_CARE_PROVIDER_SITE_OTHER): Payer: Medicare HMO | Admitting: Gastroenterology

## 2016-02-10 ENCOUNTER — Other Ambulatory Visit (INDEPENDENT_AMBULATORY_CARE_PROVIDER_SITE_OTHER): Payer: Medicare HMO

## 2016-02-10 VITALS — BP 136/86 | HR 56 | Ht 74.0 in | Wt 211.5 lb

## 2016-02-10 DIAGNOSIS — Z1211 Encounter for screening for malignant neoplasm of colon: Secondary | ICD-10-CM

## 2016-02-10 DIAGNOSIS — D649 Anemia, unspecified: Secondary | ICD-10-CM

## 2016-02-10 DIAGNOSIS — J301 Allergic rhinitis due to pollen: Secondary | ICD-10-CM | POA: Diagnosis not present

## 2016-02-10 DIAGNOSIS — J3089 Other allergic rhinitis: Secondary | ICD-10-CM | POA: Diagnosis not present

## 2016-02-10 DIAGNOSIS — Z1212 Encounter for screening for malignant neoplasm of rectum: Secondary | ICD-10-CM

## 2016-02-10 DIAGNOSIS — K219 Gastro-esophageal reflux disease without esophagitis: Secondary | ICD-10-CM

## 2016-02-10 LAB — RETICULOCYTES
ABS RETIC: 37600 {cells}/uL (ref 25000–90000)
RBC.: 4.7 MIL/uL (ref 4.20–5.80)
RETIC CT PCT: 0.8 %

## 2016-02-10 LAB — IBC PANEL
Iron: 61 ug/dL (ref 42–165)
SATURATION RATIOS: 14.3 % — AB (ref 20.0–50.0)
Transferrin: 304 mg/dL (ref 212.0–360.0)

## 2016-02-10 LAB — FERRITIN: Ferritin: 8.7 ng/mL — ABNORMAL LOW (ref 22.0–322.0)

## 2016-02-10 LAB — CBC WITH DIFFERENTIAL/PLATELET
BASOS ABS: 0 10*3/uL (ref 0.0–0.1)
BASOS PCT: 0.4 % (ref 0.0–3.0)
Eosinophils Absolute: 0.2 10*3/uL (ref 0.0–0.7)
Eosinophils Relative: 3.4 % (ref 0.0–5.0)
HCT: 40.8 % (ref 39.0–52.0)
Hemoglobin: 13.4 g/dL (ref 13.0–17.0)
LYMPHS ABS: 2.1 10*3/uL (ref 0.7–4.0)
Lymphocytes Relative: 28.9 % (ref 12.0–46.0)
MCHC: 32.8 g/dL (ref 30.0–36.0)
MCV: 85.5 fl (ref 78.0–100.0)
MONOS PCT: 8.6 % (ref 3.0–12.0)
Monocytes Absolute: 0.6 10*3/uL (ref 0.1–1.0)
NEUTROS ABS: 4.3 10*3/uL (ref 1.4–7.7)
NEUTROS PCT: 58.7 % (ref 43.0–77.0)
PLATELETS: 179 10*3/uL (ref 150.0–400.0)
RBC: 4.77 Mil/uL (ref 4.22–5.81)
RDW: 16.9 % — AB (ref 11.5–15.5)
WBC: 7.4 10*3/uL (ref 4.0–10.5)

## 2016-02-10 LAB — B12 AND FOLATE PANEL
Folate: 17.4 ng/mL (ref >5.9)
Vitamin B-12: 592 pg/mL (ref 211–911)

## 2016-02-10 LAB — HEMOGLOBIN A1C: HEMOGLOBIN A1C: 5.8 % (ref 4.6–6.5)

## 2016-02-10 MED ORDER — PANTOPRAZOLE SODIUM 20 MG PO TBEC: 20.0000 mg | DELAYED_RELEASE_TABLET | Freq: Every day | ORAL | Status: DC

## 2016-02-10 NOTE — Progress Notes (Signed)
HPI :  75 y/o male here to discuss colon cancer screening. He has not been seen here in several years. He also has a history of anemia and CAD on plavix currently.   No bowel problems - no constipation or diarrhea, no blood in the stools. No abdominal pains. No known FH of colon cancer. Negative FOBT last year. He had a dimunitive adenoma removed in 2004, and then a normal colonoscopy in 2009.  The patient is taking plavix for history of CAD, stent placed in March 2016. No chest pains or shortness of breath routinely. He had a negative / low risk nuclear stress last year.   He has a history of GERD, he takes protonix for this 40mg  once daily. His symptoms have included pyrosis and regurgitation, well controlled on current dosing of protonix. He denies any dysphagia or odynophagia. Symptoms are well controlled on his present regimen.   He otherwise reports a history of mild anemia over the past year. Hgb in the 11s with MCV in 80s. FOBT negative in 2016. Unclear etiology.    Colonoscopy 2009 - normal exam, "excellent prep" Colonoscopy 2004 - one diminutive adenoma   Past Medical History  Diagnosis Date  . Hypertension   . Hypercholesteremia   . Hearing loss   . Allergic rhinitis     allergy shots via [place]  . Peripheral vascular disease ([place])   . GERD (gastroesophageal reflux disease)   . Hx of colonic polyps   . DDD (degenerative disc disease), lumbar   . Gout   . Chronic low back pain   . Anxiety   . History  of basal cell carcinoma   . Pleural thickening     chronic  . 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 CAD, multiple vessel     a. CP/near-syncope 11/2014 s/p DES to MLAD, occluded RCA after takeoff of RV marginal with collaterals treated medically, otherwise mild nonobstructive disease. EF 55%;  b. 07/2015 Lexiscan CL: EF 56%, no ischemia/infarct.  . Prediabetes     HbA1c 6 % 09/2015.     Past Surgical History  Procedure Laterality Date  . Back injection   oct 2011, 07/2010, 12/2012    no surgery  . Shoulder injection  feb 2014  . Rotator cuff repair Left 09/2013    left  . Cardiovascular stress test  05/2009; 07/2015  . Colonoscopy  08/2008    repeat 7 yrs  . Left heart catheterization with coronary angiogram N/A 12/04/2014    Procedure: LEFT HEART CATHETERIZATION WITH CORONARY ANGIOGRAM;  Surgeon: Jolaine Artist, MD; LAD 99% then aneurysmal, OM1 40%, CFX 50%, RCA 40%, 50% then occluded  . Coronary angioplasty with stent placement  12/04/2014    4.0 x 12 mm Synergy stent to the mid LAD  . Carotid dopplers  11/2014    Bilateral - 1% to 9% ICA stenosis. Vertebral artery flow is  . Transthoracic echocardiogram  11/2014    EF 50-55%, some wall motion abnormalities noted, grade I diast dysfxn   Family History  Problem Relation Age of Onset  . Lung cancer Father   . Heart disease Father   . Breast cancer Mother   . Congestive Heart Failure Mother   . Diabetes Mother   . Breast cancer Sister   . Heart disease Brother   . COPD Brother    Social History  Substance Use Topics  . Smoking status: Former Smoker -- 2.00 packs/day for 35 years    Types: Cigarettes  Quit date: 09/28/1998  . Smokeless tobacco: Former Systems developer    Types: Snuff    Quit date: 02/09/1986     Comment: as a teenager  . Alcohol Use: No     Comment: quit 1994   Current Outpatient Prescriptions  Medication Sig Dispense Refill  . allopurinol (ZYLOPRIM) 300 MG tablet Take 1 tablet by mouth  daily 90 tablet 1  . aspirin 81 MG tablet Take 81 mg by mouth daily.      . clopidogrel (PLAVIX) 75 MG tablet Take 1 tablet (75 mg total) by mouth daily. 90 tablet 3  . clotrimazole-betamethasone (LOTRISONE) cream Apply 1 application topically daily.  1  . desonide (DESOWEN) 0.05 % cream Apply 1 application topically daily.  1  . diphenhydrAMINE (BENADRYL) 25 MG tablet Take 50 mg by mouth 3 (three) times daily.    Marland Kitchen EPIPEN 2-PAK 0.3 MG/0.3ML SOAJ injection USE UTD PRN FOR SYSTEMIC  REACTION  1  . fluticasone (FLONASE) 50 MCG/ACT nasal spray Place 1 spray into both nostrils daily.    Marland Kitchen gabapentin (NEURONTIN) 300 MG capsule Take 300 mg by mouth 4 (four) times daily.     Marland Kitchen loratadine (CLARITIN) 10 MG tablet Take 10 mg by mouth daily as needed for allergies. As needed    . metoprolol tartrate (LOPRESSOR) 25 MG tablet Take 1 tablet by mouth two  times daily 180 tablet 3  . nitroGLYCERIN (NITROSTAT) 0.4 MG SL tablet Place 1 tablet (0.4 mg total) under the tongue every 5 (five) minutes as needed for chest pain. 25 tablet 3  . Olopatadine HCl 0.6 % SOLN Place 1 puff into the nose 2 (two) times daily.     . pantoprazole (PROTONIX) 40 MG tablet Take 1 tablet (40 mg total) by mouth daily. 90 tablet 3  . PREVIDENT 5000 BOOSTER PLUS 1.1 % PSTE Take by mouth at bedtime. APPLY TO TEETH  1  . rosuvastatin (CRESTOR) 20 MG tablet Take 1 tablet by mouth  daily 90 tablet 3  . sildenafil (VIAGRA) 100 MG tablet Take 1 tablet (100 mg total) by mouth as needed for erectile dysfunction. 10 tablet 6  . traMADol (ULTRAM) 50 MG tablet Take 1 tablet (50 mg total) by mouth 3 (three) times daily as needed. for pain 270 tablet 1  . triamcinolone cream (KENALOG) 0.1 % Apply 1 application topically daily.  3  . valsartan (DIOVAN) 160 MG tablet Take 1 and 1/2 tablets by  mouth daily 135 tablet 3   No current facility-administered medications for this visit.   No Known Allergies   Review of Systems: All systems reviewed and negative except where noted in HPI.   Lab Results  Component Value Date   WBC 7.3 08/24/2015   HGB 11.9* 08/24/2015   HCT 36.6* 08/24/2015   MCV 83.8 08/24/2015   PLT 147* 08/24/2015    Lab Results  Component Value Date   CREATININE 1.05 08/24/2015   BUN 9 08/24/2015   NA 140 08/24/2015   K 3.5 08/24/2015   CL 108 08/24/2015   CO2 25 08/24/2015    Lab Results  Component Value Date   ALT 12 03/06/2015   AST 19 03/06/2015   ALKPHOS 50 03/06/2015   BILITOT 0.7  03/06/2015     Physical Exam: BP 136/86 mmHg  Pulse 56  Ht 6\' 2"  (1.88 m)  Wt 211 lb 8 oz (95.936 kg)  BMI 27.14 kg/m2 Constitutional: Pleasant,well-developed, male in no acute distress. HEENT: Normocephalic and atraumatic. Conjunctivae are  normal. No scleral icterus. Neck supple.  Cardiovascular: Normal rate, regular rhythm.  Pulmonary/chest: Effort normal and breath sounds normal. No wheezing, rales or rhonchi. Abdominal: Soft, nondistended, nontender. Bowel sounds active throughout. There are no masses palpable. No hepatomegaly. Extremities: no edema Lymphadenopathy: No cervical adenopathy noted. Neurological: Alert and oriented to person place and time. Skin: Skin is warm and dry. No rashes noted. Psychiatric: Normal mood and affect. Behavior is normal.   ASSESSMENT AND PLAN: 75 y/o male with a history of CAD on plavix, here to discuss the following issues:  Colorectal cancer screening - diminutive adenoma in 2004, last colonoscopy in 2009 was normal without polyps, "excellent prep" at that time. He has no FH of colon cancer. For screening purposes, he is not due for a repeat colonoscopy until 2019, based on current ACG guidelines. I discussed this with him and reasons for this recommendation, and he agreed with the plan. If iron deficient, as below, he will have this done sooner.   Anemia - normocytic anemia with Hgb in 11s over the past year, unclear etiology. FOBT last year was normal. Recommend iron studies, B12/folate, retic count. If iron deficient he will warrant both EGD and colonoscopy. If lab workup negative, he will follow up with primary care and consider hematology evaluation.   GERD - well controlled on current dosing of protonix, no breakthrough or alarm symptoms. I discussed the long term risks of PPIs with him. The risks of long term PPIs with current data include increased risk for chronic kidney disease, increased risk of fracture, increased risk of C diff,  increased risk of pneumonia (short term usage), potentially increased risk of B12 / calcium deficiency, and rare risk of hypomagnesemia. Recent studies have also shown an association with increased risk of dementia and cardiovascular outcomes including stroke. These studies have showed an association between PPIs and several of these outcomes but no evidence of causality. The patient was counseled to use the lowest daily use of PPI needed to control symptoms. In this light we will decrease his protonix to 20mg  daily and see how he feels on this dose. If symptoms continue to be controlled can consider switch to zantac over time.    Qulin Cellar, MD [facility] Pager [phone]

## 2016-02-10 NOTE — Patient Instructions (Addendum)
If you are age 73 or older, your body mass index should be between 23-30. Your Body mass index is 27.14 kg/(m^2). If this is out of the aforementioned range listed, please consider follow up with your Primary Care Provider.  If you are age 58 or younger, your body mass index should be between 19-25. Your Body mass index is 27.14 kg/(m^2). If this is out of the aformentioned range listed, please consider follow up with your Primary Care Provider.   We have sent the following medications to your pharmacy for you to pick up at your convenience: Protonix 20mg    Your physician has requested that you go to the basement for the lab work before leaving today.  You will be due for a recall colonoscopy in 2019. We will send you a reminder in the mail when it gets closer to that time.  Thank you for choosing  GI  Dr Chauncey Cruel. [place]

## 2016-02-11 ENCOUNTER — Other Ambulatory Visit: Payer: Self-pay | Admitting: *Deleted

## 2016-02-11 ENCOUNTER — Encounter: Payer: Self-pay | Admitting: *Deleted

## 2016-02-11 ENCOUNTER — Telehealth: Payer: Self-pay | Admitting: *Deleted

## 2016-02-11 DIAGNOSIS — D509 Iron deficiency anemia, unspecified: Secondary | ICD-10-CM

## 2016-02-11 NOTE — Telephone Encounter (Signed)
Patient ok to hold plavix 5-7 days prior to endoscopy.  His last PCI was 11/2014

## 2016-02-11 NOTE — Telephone Encounter (Signed)
Left a message for patient that Dr. Radford Pax has ok'ed holding Plavix prior to procedure and we will give him instructions at the previsit.

## 2016-02-11 NOTE — Telephone Encounter (Signed)
02/11/2016   RE: Brandon Haynes DOB: December 25, 1940 MRN: [id number]   Dear  Dr. Radford Pax,  We have scheduled the above patient for an endoscopic procedure. Our records show that he is on anticoagulation therapy.  Please advise as to whether the patient may hold Plavix 5-7 days prior to the procedure, which is scheduled for 02/27/16.  Please route back your response to Leone Payor, RN   Sincerely, Leone Payor

## 2016-02-11 NOTE — Telephone Encounter (Signed)
To Leone Payor, RN.

## 2016-02-11 NOTE — Telephone Encounter (Signed)
Left a message for patient to call back. 

## 2016-02-18 DIAGNOSIS — J3089 Other allergic rhinitis: Secondary | ICD-10-CM | POA: Diagnosis not present

## 2016-02-18 DIAGNOSIS — J301 Allergic rhinitis due to pollen: Secondary | ICD-10-CM | POA: Diagnosis not present

## 2016-02-20 ENCOUNTER — Encounter: Payer: Self-pay | Admitting: Gastroenterology

## 2016-02-20 ENCOUNTER — Ambulatory Visit (AMBULATORY_SURGERY_CENTER): Payer: Self-pay | Admitting: *Deleted

## 2016-02-20 VITALS — Ht 74.0 in | Wt 212.0 lb

## 2016-02-20 DIAGNOSIS — D509 Iron deficiency anemia, unspecified: Secondary | ICD-10-CM

## 2016-02-20 MED ORDER — NA SULFATE-K SULFATE-MG SULF 17.5-3.13-1.6 GM/177ML PO SOLN: 1.0000 | Freq: Once | ORAL | Status: DC

## 2016-02-20 NOTE — Progress Notes (Signed)
No egg or soy allergy known to patient  No issues with past sedation with any surgeries  or procedures, no intubation problems  No diet pills per patient No home 02 use per patient  Takes blood thinners per patient - pt takes plavix due to stent placement 11-2014-okay to hold x 5 days per Ashok Norris MD -pt instructed to stop 5-27 Saturday  Pt denies issues with constipation

## 2016-02-25 DIAGNOSIS — J301 Allergic rhinitis due to pollen: Secondary | ICD-10-CM | POA: Diagnosis not present

## 2016-02-25 DIAGNOSIS — J3089 Other allergic rhinitis: Secondary | ICD-10-CM | POA: Diagnosis not present

## 2016-02-27 ENCOUNTER — Ambulatory Visit (AMBULATORY_SURGERY_CENTER): Payer: Medicare HMO | Admitting: Gastroenterology

## 2016-02-27 ENCOUNTER — Encounter: Payer: Self-pay | Admitting: Gastroenterology

## 2016-02-27 VITALS — BP 145/89 | HR 66 | Temp 98.0°F | Resp 13 | Ht 74.0 in | Wt 212.0 lb

## 2016-02-27 DIAGNOSIS — D125 Benign neoplasm of sigmoid colon: Secondary | ICD-10-CM | POA: Diagnosis not present

## 2016-02-27 DIAGNOSIS — K219 Gastro-esophageal reflux disease without esophagitis: Secondary | ICD-10-CM | POA: Diagnosis not present

## 2016-02-27 DIAGNOSIS — I739 Peripheral vascular disease, unspecified: Secondary | ICD-10-CM | POA: Diagnosis not present

## 2016-02-27 DIAGNOSIS — D123 Benign neoplasm of transverse colon: Secondary | ICD-10-CM | POA: Diagnosis not present

## 2016-02-27 DIAGNOSIS — E669 Obesity, unspecified: Secondary | ICD-10-CM | POA: Diagnosis not present

## 2016-02-27 DIAGNOSIS — D120 Benign neoplasm of cecum: Secondary | ICD-10-CM

## 2016-02-27 DIAGNOSIS — D124 Benign neoplasm of descending colon: Secondary | ICD-10-CM

## 2016-02-27 DIAGNOSIS — D122 Benign neoplasm of ascending colon: Secondary | ICD-10-CM | POA: Diagnosis not present

## 2016-02-27 DIAGNOSIS — D509 Iron deficiency anemia, unspecified: Secondary | ICD-10-CM

## 2016-02-27 DIAGNOSIS — K2970 Gastritis, unspecified, without bleeding: Secondary | ICD-10-CM | POA: Diagnosis not present

## 2016-02-27 DIAGNOSIS — Z1211 Encounter for screening for malignant neoplasm of colon: Secondary | ICD-10-CM

## 2016-02-27 DIAGNOSIS — I10 Essential (primary) hypertension: Secondary | ICD-10-CM | POA: Diagnosis not present

## 2016-02-27 DIAGNOSIS — I2510 Atherosclerotic heart disease of native coronary artery without angina pectoris: Secondary | ICD-10-CM | POA: Diagnosis not present

## 2016-02-27 DIAGNOSIS — K2950 Unspecified chronic gastritis without bleeding: Secondary | ICD-10-CM | POA: Diagnosis not present

## 2016-02-27 DIAGNOSIS — K317 Polyp of stomach and duodenum: Secondary | ICD-10-CM | POA: Diagnosis not present

## 2016-02-27 HISTORY — PX: ESOPHAGOGASTRODUODENOSCOPY: SHX1529

## 2016-02-27 HISTORY — PX: OTHER SURGICAL HISTORY: SHX169

## 2016-02-27 MED ORDER — SODIUM CHLORIDE 0.9 % IV SOLN: 500.0000 mL | INTRAVENOUS | Status: DC

## 2016-02-27 NOTE — Progress Notes (Signed)
Report to PACU, RN, vss, BBS= Clear.  

## 2016-02-27 NOTE — Progress Notes (Signed)
Called to room to assist during endoscopic procedure.  Patient ID and intended procedure confirmed with present staff. Received instructions for my participation in the procedure from the performing physician.  

## 2016-02-27 NOTE — Op Note (Signed)
[place] Patient Name: Brandon Haynes Procedure Date: 02/27/2016 1:51 PM MRN: [id number] Endoscopist: Remo Lipps P. Havery Moros , MD Age: 75 Referring MD:  Date of Birth: [birth year] Gender: Male Procedure:                Upper GI endoscopy Indications:              Iron deficiency anemia Medicines:                Monitored Anesthesia Care Procedure:                Pre-Anesthesia Assessment:                           - Prior to the procedure, a History and Physical                            was performed, and patient medications and                            allergies were reviewed. The patient's tolerance of                            previous anesthesia was also reviewed. The risks                            and benefits of the procedure and the sedation                            options and risks were discussed with the patient.                            All questions were answered, and informed consent                            was obtained. Prior Anticoagulants: The patient has                            taken Plavix (clopidogrel), last dose was 5 days                            prior to procedure. ASA Grade Assessment: III - A                            patient with severe systemic disease. After                            reviewing the risks and benefits, the patient was                            deemed in satisfactory condition to undergo the                            procedure.  After obtaining informed consent, the endoscope was                            passed under direct vision. Throughout the                            procedure, the patient's blood pressure, pulse, and                            oxygen saturations were monitored continuously. The                            Model GIF-HQ190 [id number]) scope was introduced                            through the mouth, and advanced to the second part                            of  duodenum. The upper GI endoscopy was                            accomplished without difficulty. The patient                            tolerated the procedure well. Scope In: Scope Out: Findings:                 Esophagogastric landmarks were identified: the                            Z-line was found at 43 cm, the gastroesophageal                            junction was found at 43 cm and the upper extent of                            the gastric folds was found at 43 cm from the                            incisors.                           The exam of the esophagus was otherwise normal.                           Patchy mild inflammation characterized by erythema                            was found in the gastric fundus and in the gastric                            body. Biopsies were taken with a cold forceps for  Helicobacter pylori testing.                           A single 3 mm sessile polyp with no stigmata of                            recent bleeding was found in the gastric body. The                            polyp was removed with a cold biopsy forceps.                            Resection and retrieval were complete.                           The exam of the stomach was otherwise normal.                           The duodenal bulb and second portion of the                            duodenum were normal. Complications:            No immediate complications. Estimated blood loss:                            Minimal. Estimated Blood Loss:     Estimated blood loss was minimal. Impression:               - Esophagogastric landmarks identified.                           - Gastritis. Biopsied.                           - A single gastric polyp. Resected and retrieved.                           - Normal duodenal bulb and second portion of the                            duodenum.                           - Overall it is possible gastritis has caused iron                             deficiency, will await H pylori testing Recommendation:           - Patient has a contact number available for                            emergencies. The signs and symptoms of potential                            delayed complications were discussed with the  patient. Return to normal activities tomorrow.                            Written discharge instructions were provided to the                            patient.                           - Resume previous diet.                           - Continue present medications.                           - Resume aspirin                           - Resume plavix in 3 days per colonoscopy report                           - Increase protonix to twice daily dosing                           - Await pathology results.                           - No repeat upper endoscopy. Remo Lipps P. [place], MD 02/27/2016 2:55:26 PM This report has been signed electronically.

## 2016-02-27 NOTE — Op Note (Signed)
[place] Patient Name: Brandon Haynes Procedure Date: 02/27/2016 2:03 PM MRN: [id number] Endoscopist: Remo Lipps P. Havery Brandon Haynes , Brandon Haynes Age: 75 Referring Brandon Haynes:  Date of Birth: [birth year] Gender: Male Procedure:                Colonoscopy Indications:              Iron deficiency anemia Medicines:                Monitored Anesthesia Care Procedure:                Pre-Anesthesia Assessment:                           - Prior to the procedure, a History and Physical                            was performed, and patient medications and                            allergies were reviewed. The patient's tolerance of                            previous anesthesia was also reviewed. The risks                            and benefits of the procedure and the sedation                            options and risks were discussed with the patient.                            All questions were answered, and informed consent                            was obtained. Prior Anticoagulants: The patient has                            taken Plavix (clopidogrel), last dose was 5 days                            prior to procedure. ASA Grade Assessment: III - A                            patient with severe systemic disease. After                            reviewing the risks and benefits, the patient was                            deemed in satisfactory condition to undergo the                            procedure.  After obtaining informed consent, the colonoscope                            was passed under direct vision. Throughout the                            procedure, the patient's blood pressure, pulse, and                            oxygen saturations were monitored continuously. The                            Model CF-HQ190L [id number]) scope was introduced                            through the anus and advanced to the the terminal                            ileum, with  identification of the appendiceal                            orifice and IC valve. The colonoscopy was performed                            without difficulty. The patient tolerated the                            procedure well. The quality of the bowel                            preparation was adequate. The terminal ileum,                            ileocecal valve, appendiceal orifice, and rectum                            were photographed. Scope In: 2:04:13 PM Scope Out: 2:38:49 PM Scope Withdrawal Time: 0 hours 28 minutes 11 seconds  Total Procedure Duration: 0 hours 34 minutes 36 seconds  Findings:                 The perianal and digital rectal examinations were                            normal.                           Three sessile polyps were found in the cecum. The                            polyps were 3 to 8 mm in size. These polyps were                            removed with a cold snare. Resection and retrieval  were complete.                           Two sessile polyps were found in the ascending                            colon. The polyps were 4 to 5 mm in size. These                            polyps were removed with a cold snare. Resection                            and retrieval were complete.                           A 4 mm polyp was found in the hepatic flexure. The                            polyp was sessile. The polyp was removed with a                            cold snare. Resection and retrieval were complete.                           Two sessile polyps were found in the transverse                            colon. The polyps were 4 to 5 mm in size. These                            polyps were removed with a cold snare. Resection                            and retrieval were complete.                           Two sessile polyps were found in the descending                            colon. The polyps were 4 to 6 mm in size.  These                            polyps were removed with a cold snare. Resection                            and retrieval were complete.                           Two sessile polyps were found in the sigmoid colon.                            The polyps were 2 to 6 mm in size. These polyps  were removed with a cold snare. Resection and                            retrieval were complete. At the site of the largest                            polypectomy, a suspected prominent blood clot vs.                            vessel was noted, without active bleeding. Given                            the patient's plavix use, two hemostatic clips were                            successfully placed as a preventative measure to                            close the defect. There was no bleeding at the end                            of the procedure.                           A few medium-mouthed diverticula were found in the                            left colon.                           The terminal ileum appeared normal.                           The exam was otherwise without abnormality on                            direct and retroflexion views. Complications:            No immediate complications. Estimated blood loss:                            Minimal. Estimated Blood Loss:     Estimated blood loss was minimal. Impression:               - Three 3 to 8 mm polyps in the cecum, removed with                            a cold snare. Resected and retrieved.                           - Two 4 to 5 mm polyps in the ascending colon,                            removed with a cold snare. Resected and retrieved.                           -  One 4 mm polyp at the hepatic flexure, removed                            with a cold snare. Resected and retrieved.                           - Two 4 to 5 mm polyps in the transverse colon,                            removed with a cold snare.  Resected and retrieved.                           - Two 4 to 6 mm polyps in the descending colon,                            removed with a cold snare. Resected and retrieved.                           - Two 2 to 6 mm polyps in the sigmoid colon,                            removed with a cold snare. Resected and retrieved.                            Clips were placed.                           - Diverticulosis in the left colon.                           - The examined portion of the ileum was normal.                           - The examination was otherwise normal on direct                            and retroflexion views.                           -Overall no clear pathology to cause iron                            deficiency on these exams. Recommendation:           - Patient has a contact number available for                            emergencies. The signs and symptoms of potential                            delayed complications were discussed with the                            patient. Return  to normal activities tomorrow.                            Written discharge instructions were provided to the                            patient.                           - Resume previous diet.                           - Continue present medications.                           - Resume aspirin today                           - Resume Plavix in 3 DAYS                           - No ibuprofen, naproxen, or other non-steroidal                            anti-inflammatory drugs for 2 weeks after polyp                            removal.                           - Await pathology results.                           - Repeat colonoscopy is recommended for                            surveillance. The colonoscopy date will be                            determined after pathology results from today's                            exam become available for review. Remo Lipps P. [place]qua Coldren, Brandon Haynes 02/27/2016  2:51:37 PM This report has been signed electronically.

## 2016-02-27 NOTE — Patient Instructions (Signed)
Impression/recommendations:  Colonoscopy:  Polyps (handout given) Diverticulosis (handout given) High Fiber Diet (handout given)  Resume aspirin today. Resume Plavix in 3 days. No NSAIDS for 2 weeks. Tylenol only if needed until 03/13/16.  Endoscopy:  Gastritis (handout given)  Increase Protonix to twice daily dosing.  YOU HAD AN ENDOSCOPIC PROCEDURE TODAY AT [facility]:   Refer to the procedure report that was given to you for any specific questions about what was found during the examination.  If the procedure report does not answer your questions, please call your gastroenterologist to clarify.  If you requested that your care partner not be given the details of your procedure findings, then the procedure report has been included in a sealed envelope for you to review at your convenience later.  YOU SHOULD EXPECT: Some feelings of bloating in the abdomen. Passage of more gas than usual.  Walking can help get rid of the air that was put into your GI tract during the procedure and reduce the bloating. If you had a lower endoscopy (such as a colonoscopy or flexible sigmoidoscopy) you may notice spotting of blood in your stool or on the toilet paper. If you underwent a bowel prep for your procedure, you may not have a normal bowel movement for a few days.  Please Note:  You might notice some irritation and congestion in your nose or some drainage.  This is from the oxygen used during your procedure.  There is no need for concern and it should clear up in a day or so.  SYMPTOMS TO REPORT IMMEDIATELY:   Following lower endoscopy (colonoscopy or flexible sigmoidoscopy):  Excessive amounts of blood in the stool  Significant tenderness or worsening of abdominal pains  Swelling of the abdomen that is new, acute  Fever of 100F or higher   Following upper endoscopy (EGD)  Vomiting of blood or coffee ground material  New chest pain or pain under the shoulder  blades  Painful or persistently difficult swallowing  New shortness of breath  Fever of 100F or higher  Black, tarry-looking stools  For urgent or emergent issues, a gastroenterologist can be reached at any hour by calling [phone].   DIET: Your first meal following the procedure should be a small meal and then it is ok to progress to your normal diet. Heavy or fried foods are harder to digest and may make you feel nauseous or bloated.  Likewise, meals heavy in dairy and vegetables can increase bloating.  Drink plenty of fluids but you should avoid alcoholic beverages for 24 hours.  ACTIVITY:  You should plan to take it easy for the rest of today and you should NOT DRIVE or use heavy machinery until tomorrow (because of the sedation medicines used during the test).    FOLLOW UP: Our staff will call the number listed on your records the next business day following your procedure to check on you and address any questions or concerns that you may have regarding the information given to you following your procedure. If we do not reach you, we will leave a message.  However, if you are feeling well and you are not experiencing any problems, there is no need to return our call.  We will assume that you have returned to your regular daily activities without incident.  If any biopsies were taken you will be contacted by phone or by letter within the next 1-3 weeks.  Please call us at [phone] if you have  not heard about the biopsies in 3 weeks.    SIGNATURES/CONFIDENTIALITY: You and/or your care partner have signed paperwork which will be entered into your electronic medical record.  These signatures attest to the fact that that the information above on your After Visit Summary has been reviewed and is understood.  Full responsibility of the confidentiality of this discharge information lies with you and/or your care-partner.

## 2016-02-28 ENCOUNTER — Telehealth: Payer: Self-pay | Admitting: *Deleted

## 2016-02-28 NOTE — Telephone Encounter (Signed)
  Follow up Call-  Call back number 02/27/2016  Post procedure Call Back phone  # [phone] hm  Permission to leave phone message Yes     Patient questions:  Do you have a fever, pain , or abdominal swelling? No. Pain Score  0 *  Have you tolerated food without any problems? Yes.    Have you been able to return to your normal activities? Yes.    Do you have any questions about your discharge instructions: Diet   No. Medications  No. Follow up visit  No.  Do you have questions or concerns about your Care? No.  Actions: * If pain score is 4 or above: No action needed, pain <4.

## 2016-03-03 DIAGNOSIS — M5416 Radiculopathy, lumbar region: Secondary | ICD-10-CM | POA: Diagnosis not present

## 2016-03-03 DIAGNOSIS — M5136 Other intervertebral disc degeneration, lumbar region: Secondary | ICD-10-CM | POA: Diagnosis not present

## 2016-03-03 DIAGNOSIS — Z6826 Body mass index (BMI) 26.0-26.9, adult: Secondary | ICD-10-CM | POA: Diagnosis not present

## 2016-03-03 DIAGNOSIS — M4726 Other spondylosis with radiculopathy, lumbar region: Secondary | ICD-10-CM | POA: Diagnosis not present

## 2016-03-03 DIAGNOSIS — M4806 Spinal stenosis, lumbar region: Secondary | ICD-10-CM | POA: Diagnosis not present

## 2016-03-03 DIAGNOSIS — J3089 Other allergic rhinitis: Secondary | ICD-10-CM | POA: Diagnosis not present

## 2016-03-03 DIAGNOSIS — J301 Allergic rhinitis due to pollen: Secondary | ICD-10-CM | POA: Diagnosis not present

## 2016-03-05 ENCOUNTER — Encounter: Payer: Self-pay | Admitting: *Deleted

## 2016-03-05 ENCOUNTER — Other Ambulatory Visit: Payer: Self-pay | Admitting: *Deleted

## 2016-03-05 DIAGNOSIS — D509 Iron deficiency anemia, unspecified: Secondary | ICD-10-CM

## 2016-03-05 MED ORDER — FERROUS SULFATE 325 (65 FE) MG PO TABS: 325.0000 mg | ORAL_TABLET | Freq: Every day | ORAL | Status: DC

## 2016-03-05 MED ORDER — PANTOPRAZOLE SODIUM 20 MG PO TBEC
DELAYED_RELEASE_TABLET | ORAL | Status: DC
Start: 2016-03-05 — End: 2016-03-06

## 2016-03-06 ENCOUNTER — Encounter: Payer: Self-pay | Admitting: Cardiology

## 2016-03-06 ENCOUNTER — Ambulatory Visit (INDEPENDENT_AMBULATORY_CARE_PROVIDER_SITE_OTHER): Payer: Medicare HMO | Admitting: Cardiology

## 2016-03-06 VITALS — BP 136/98 | HR 54 | Ht 74.0 in | Wt 209.6 lb

## 2016-03-06 DIAGNOSIS — I2510 Atherosclerotic heart disease of native coronary artery without angina pectoris: Secondary | ICD-10-CM

## 2016-03-06 DIAGNOSIS — M79606 Pain in leg, unspecified: Secondary | ICD-10-CM | POA: Diagnosis not present

## 2016-03-06 DIAGNOSIS — I10 Essential (primary) hypertension: Secondary | ICD-10-CM | POA: Diagnosis not present

## 2016-03-06 DIAGNOSIS — E7800 Pure hypercholesterolemia, unspecified: Secondary | ICD-10-CM

## 2016-03-06 DIAGNOSIS — M79609 Pain in unspecified limb: Secondary | ICD-10-CM | POA: Diagnosis not present

## 2016-03-06 HISTORY — DX: Pain in leg, unspecified: M79.606

## 2016-03-06 LAB — BASIC METABOLIC PANEL
BUN: 9 mg/dL (ref 7–25)
CHLORIDE: 104 mmol/L (ref 98–110)
CO2: 27 mmol/L (ref 20–31)
Calcium: 8.9 mg/dL (ref 8.6–10.3)
Creat: 1.07 mg/dL (ref 0.70–1.18)
Glucose, Bld: 87 mg/dL (ref 65–99)
POTASSIUM: 3.9 mmol/L (ref 3.5–5.3)
SODIUM: 141 mmol/L (ref 135–146)

## 2016-03-06 LAB — LIPID PANEL
CHOL/HDL RATIO: 2.6 ratio (ref <=5.0)
CHOLESTEROL: 139 mg/dL (ref 125–200)
HDL: 54 mg/dL (ref >=40)
LDL Cholesterol: 64 mg/dL (ref <130)
Triglycerides: 105 mg/dL (ref <150)
VLDL: 21 mg/dL (ref <30)

## 2016-03-06 LAB — HEPATIC FUNCTION PANEL
ALK PHOS: 46 U/L (ref 40–115)
ALT: 13 U/L (ref 9–46)
AST: 20 U/L (ref 10–35)
Albumin: 4.7 g/dL (ref 3.6–5.1)
BILIRUBIN DIRECT: 0.2 mg/dL (ref <=0.2)
BILIRUBIN TOTAL: 0.8 mg/dL (ref 0.2–1.2)
Indirect Bilirubin: 0.6 mg/dL (ref 0.2–1.2)
Total Protein: 7 g/dL (ref 6.1–8.1)

## 2016-03-06 NOTE — Patient Instructions (Signed)
Medication Instructions:  Your physician recommends that you continue on your current medications as directed. Please refer to the Current Medication list given to you today.   Labwork: TODAY: BMET, LFTs, Lipids  Testing/Procedures: Your physician has requested that you have a lower extremity arterial duplex. During this test, ultrasound is used to evaluate arterial blood flow in the legs. Allow one hour for this exam. There are no restrictions or special instructions.   Follow-Up: Your physician wants you to follow-up in: 6 months with Dr. Radford Pax. You will receive a reminder letter in the mail two months in advance. If you don't receive a letter, please call our office to schedule the follow-up appointment.   Any Other Special Instructions Will Be Listed Below (If Applicable).     If you need a refill on your cardiac medications before your next appointment, please call your pharmacy.

## 2016-03-06 NOTE — Progress Notes (Signed)
Cardiology Office Note    Date:  03/06/2016   ID:  JAKEL CROUCH, [name] [birth year], MRN [id number]  PCP:  Tammi Sou, MD  Cardiologist:  Fransico Him, MD   No chief complaint on file.   History of Present Illness:  Brandon Haynes is a 75 y.o. male with no history of 2 vessel ASCAD with a 99% LAD and an occluded RCA. The RCA was felt well collateralized.  He underwent PCI of the LAD. LV function was 55% at time of cath. Echocardiogram revealed akinesis of the basal-mid inferoseptal myocardium and  mid-apical anteroseptal myocardium.  His last nuclear stress test showed no ischemia. His angina has presented as CP and hypotension in the past.  Patient presents today for follow-up.  He has been active at home without any discomfort. He is taking Brilinta as directed. He denies anginal chest pain, SOB, DOE, dizziness, PND, lower extremity edema.  He has chronic leg pain at night that is relieved with gabapentin, Tylenol and Tramadol.  He has significant DJD of the lumbar spine.  He has no claudication symptoms.     Past Medical History  Diagnosis Date  . Hypertension   . Hypercholesteremia   . Hearing loss   . Allergic rhinitis     allergy shots via [place]  . Peripheral vascular disease ([place])   . GERD (gastroesophageal reflux disease)   . Hx of colonic polyps   . DDD (degenerative disc disease), lumbar   . Gout   . Chronic low back pain   . Anxiety   . History  of basal cell carcinoma   . Pleural thickening     chronic  . 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 CAD, multiple vessel     a. CP/near-syncope 11/2014 s/p DES to MLAD, occluded RCA after takeoff of RV marginal with collaterals treated medically, otherwise mild nonobstructive disease. EF 55%;  b. 07/2015 Lexiscan CL: EF 56%, no ischemia/infarct.  . Prediabetes     HbA1c 6 % 09/2015.  Marland Kitchen Allergy   . Cancer ([place])     skin  . Anemia     IDA  . Leg pain 03/06/2016    Past Surgical History  Procedure  Laterality Date  . Back injection  oct 2011, 07/2010, 12/2012    no surgery  . Shoulder injection  feb 2014  . Rotator cuff repair Left 09/2013    left  . Cardiovascular stress test  05/2009; 07/2015  . Colonoscopy  08/2008    repeat 7 yrs  . Left heart catheterization with coronary angiogram N/A 12/04/2014    Procedure: LEFT HEART CATHETERIZATION WITH CORONARY ANGIOGRAM;  Surgeon: Jolaine Artist, MD; LAD 99% then aneurysmal, OM1 40%, CFX 50%, RCA 40%, 50% then occluded  . Coronary angioplasty with stent placement  12/04/2014    4.0 x 12 mm Synergy stent to the mid LAD  . Carotid dopplers  11/2014    Bilateral - 1% to 9% ICA stenosis. Vertebral artery flow is  . Transthoracic echocardiogram  11/2014    EF 50-55%, some wall motion abnormalities noted, grade I diast dysfxn    Current Medications: Outpatient Prescriptions Prior to Visit  Medication Sig Dispense Refill  . allopurinol (ZYLOPRIM) 300 MG tablet Take 1 tablet by mouth  daily 90 tablet 1  . aspirin 81 MG tablet Take 81 mg by mouth daily.      . clopidogrel (PLAVIX) 75 MG tablet Take 1 tablet (75 mg total) by mouth daily. [place]  tablet 3  . clotrimazole-betamethasone (LOTRISONE) cream Apply 1 application topically daily.  1  . desonide (DESOWEN) 0.05 % cream Apply 1 application topically daily. Reported on 02/27/2016  1  . diphenhydrAMINE (BENADRYL) 25 MG tablet Take 50 mg by mouth 3 (three) times daily as needed for allergies.     Marland Kitchen EPIPEN 2-PAK 0.3 MG/0.3ML SOAJ injection Use as directed  1  . fluticasone (FLONASE) 50 MCG/ACT nasal spray Place 1 spray into both nostrils daily.    Marland Kitchen gabapentin (NEURONTIN) 300 MG capsule Take 300 mg by mouth 4 (four) times daily.     Marland Kitchen loratadine (CLARITIN) 10 MG tablet Take 10 mg by mouth daily as needed for allergies. As needed    . metoprolol tartrate (LOPRESSOR) 25 MG tablet Take 1 tablet by mouth two  times daily 180 tablet 3  . nitroGLYCERIN (NITROSTAT) 0.4 MG SL tablet Place 1 tablet (0.4 mg  total) under the tongue every 5 (five) minutes as needed for chest pain. 25 tablet 3  . Olopatadine HCl 0.6 % SOLN Place 1 puff into the nose 2 (two) times daily. Reported on 02/20/2016    . PREVIDENT 5000 BOOSTER PLUS 1.1 % PSTE Take by mouth as directed. APPLY TO TEETH AT BEDTIME  1  . rosuvastatin (CRESTOR) 20 MG tablet Take 1 tablet by mouth  daily 90 tablet 3  . sildenafil (VIAGRA) 100 MG tablet Take 1 tablet (100 mg total) by mouth as needed for erectile dysfunction. 10 tablet 6  . traMADol (ULTRAM) 50 MG tablet Take 1 tablet (50 mg total) by mouth 3 (three) times daily as needed. for pain 270 tablet 1  . valsartan (DIOVAN) 160 MG tablet Take 1 and 1/2 tablets by  mouth daily 135 tablet 3  . triamcinolone cream (KENALOG) 0.1 % Apply 1 application topically daily. Reported on 02/27/2016  3  . ferrous sulfate 325 (65 FE) MG tablet Take 1 tablet (325 mg total) by mouth daily with breakfast. (Patient not taking: Reported on 03/06/2016) 30 tablet 3  . pantoprazole (PROTONIX) 20 MG tablet Take one po BID x 2 weeks then daily 90 tablet 3   No facility-administered medications prior to visit.     Allergies:   Review of patient's allergies indicates no known allergies.   Social History   Social History  . Marital Status: Married    Spouse Name: Vaughan Basta  . Number of Children: 3  . Years of Education: N/A   Occupational History  . retired    Social History Main Topics  . Smoking status: Former Smoker -- 2.00 packs/day for 35 years    Types: Cigarettes    Quit date: 09/28/1998  . Smokeless tobacco: Former Systems developer    Types: Snuff    Quit date: 02/09/1986     Comment: as a teenager  . Alcohol Use: No     Comment: quit 1994  . Drug Use: No  . Sexual Activity: Not Asked   Other Topics Concern  . None   Social History Narrative   Married, 3 children.   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   Orig from [place], [place] same [place] his [place].   Former smoker: quit  approx around [place].  80 pack-yr hx.   Alcohol-none, used to drink a lot of beer on weekends.  No drugs.   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   Diet: fair     Family History:  The patient's family history  includes Breast cancer in his mother and sister; COPD in his brother; Congestive Heart Failure in his mother; Diabetes in his mother; Heart disease in his brother and father; Lung cancer in his father. There is no history of Colon cancer, Esophageal cancer, Pancreatic cancer, Rectal cancer, Stomach cancer, or Prostate cancer.   ROS:   Please see the history of present illness.    Review of Systems  Musculoskeletal: Positive for back pain.   All other systems reviewed and are negative.   PHYSICAL EXAM:   VS:  BP 136/98 mmHg  Pulse 54  Ht 6\' 2"  (1.88 m)  Wt 209 lb 9.6 oz (95.074 kg)  BMI 26.90 kg/m2  SpO2 98%   GEN: Well nourished, well developed, in no acute distress HEENT: normal Neck: no JVD, carotid bruits, or masses Cardiac: RRR; no murmurs, rubs, or gallops,no edema.  Intact distal pulses bilaterally but only 1+ Respiratory:  clear to auscultation bilaterally, normal work of breathing GI: soft, nontender, nondistended, + BS MS: no deformity or atrophy Skin: warm and dry, no rash Neuro:  Alert and Oriented x 3, Strength and sensation are intact Psych: euthymic mood, full affect  Wt Readings from Last 3 Encounters:  03/06/16 209 lb 9.6 oz (95.074 kg)  02/27/16 212 lb (96.163 kg)  02/20/16 212 lb (96.163 kg)      Studies/Labs Reviewed:   EKG:  EKG is not ordered today.    Recent Labs: 08/24/2015: BUN 9; Creatinine, Ser 1.05; Potassium 3.5; Sodium 140 02/10/2016: Hemoglobin 13.4; Platelets 179.0   Lipid Panel    Component Value Date/Time   CHOL 140 08/24/2015 0358   TRIG 239* 08/24/2015 0358   HDL 40* 08/24/2015 0358   CHOLHDL 3.5 08/24/2015 0358   VLDL 48* 08/24/2015 0358   LDLCALC 52 08/24/2015 0358    Additional studies/ records that were  reviewed today include:  none    ASSESSMENT:    1. Coronary artery disease involving native coronary artery of native heart without angina pectoris   2. Essential hypertension   3. HYPERCHOLESTEROLEMIA   4. Pain of lower extremity, unspecified laterality      PLAN:  In order of problems listed above:  1. ASCAD with known chronically occluded RCA with left to right collaterals and 99% LAD s/p PCI of LAD.  He has not had any angina.  He will continue on ASA/Plavix/BB/statin. 2. HTN - BP well controlled.  Continue BB and ARB. 3. Hyperlipidemia - LDL goal < 70.  Continue statin.  LDL in 07/2015 was 52.  Repeat FLP and ALT. 4. Leg pain bilaterally worse at night.  This has been attributed to severe DJD in the back in the past but his pulses are decreased in his Posterior tibial arteries so I will get LE ABIs.      Medication Adjustments/Labs and Tests Ordered: Current medicines are reviewed at length with the patient today.  Concerns regarding medicines are outlined above.  Medication changes, Labs and Tests ordered today are listed in the Patient Instructions below.  There are no Patient Instructions on file for this visit.   Signed, Fransico Him, MD  03/06/2016 11:11 AM    [address], [place], [place]  [address] Phone: [phone]; Fax: [phone]

## 2016-03-06 NOTE — Addendum Note (Signed)
Addended by: Stephannie Peters on: 03/06/2016 11:32 AM   Modules accepted: Orders

## 2016-03-10 DIAGNOSIS — J3089 Other allergic rhinitis: Secondary | ICD-10-CM | POA: Diagnosis not present

## 2016-03-10 DIAGNOSIS — J301 Allergic rhinitis due to pollen: Secondary | ICD-10-CM | POA: Diagnosis not present

## 2016-03-12 ENCOUNTER — Other Ambulatory Visit: Payer: Self-pay | Admitting: Cardiology

## 2016-03-12 DIAGNOSIS — I739 Peripheral vascular disease, unspecified: Secondary | ICD-10-CM

## 2016-03-18 ENCOUNTER — Ambulatory Visit (INDEPENDENT_AMBULATORY_CARE_PROVIDER_SITE_OTHER): Payer: Medicare HMO | Admitting: Gastroenterology

## 2016-03-18 DIAGNOSIS — D509 Iron deficiency anemia, unspecified: Secondary | ICD-10-CM | POA: Diagnosis not present

## 2016-03-18 NOTE — Progress Notes (Signed)
Patient here for capsule endoscopy. Tolerated procedure. Verbalizes understanding of written and verbal instructions. Wife accompanies the patient. Lot [id number] ID # [id number] Exp 2017-04-27

## 2016-03-19 ENCOUNTER — Ambulatory Visit (HOSPITAL_COMMUNITY)
Admission: RE | Admit: 2016-03-19 | Discharge: 2016-03-19 | Disposition: A | Discharge status: Home / Self Care | Payer: Medicare HMO | Source: Ambulatory Visit | Attending: Cardiology | Admitting: Cardiology

## 2016-03-19 ENCOUNTER — Other Ambulatory Visit: Payer: Self-pay | Admitting: Cardiology

## 2016-03-19 DIAGNOSIS — E7800 Pure hypercholesterolemia, unspecified: Secondary | ICD-10-CM | POA: Insufficient documentation

## 2016-03-19 DIAGNOSIS — J301 Allergic rhinitis due to pollen: Secondary | ICD-10-CM | POA: Diagnosis not present

## 2016-03-19 DIAGNOSIS — K219 Gastro-esophageal reflux disease without esophagitis: Secondary | ICD-10-CM | POA: Insufficient documentation

## 2016-03-19 DIAGNOSIS — R69 Illness, unspecified: Secondary | ICD-10-CM | POA: Diagnosis not present

## 2016-03-19 DIAGNOSIS — R0989 Other specified symptoms and signs involving the circulatory and respiratory systems: Secondary | ICD-10-CM | POA: Diagnosis not present

## 2016-03-19 DIAGNOSIS — R7303 Prediabetes: Secondary | ICD-10-CM | POA: Diagnosis not present

## 2016-03-19 DIAGNOSIS — J3089 Other allergic rhinitis: Secondary | ICD-10-CM | POA: Diagnosis not present

## 2016-03-19 DIAGNOSIS — F419 Anxiety disorder, unspecified: Secondary | ICD-10-CM | POA: Diagnosis not present

## 2016-03-19 DIAGNOSIS — I10 Essential (primary) hypertension: Secondary | ICD-10-CM | POA: Insufficient documentation

## 2016-03-19 DIAGNOSIS — I2510 Atherosclerotic heart disease of native coronary artery without angina pectoris: Secondary | ICD-10-CM | POA: Insufficient documentation

## 2016-03-19 DIAGNOSIS — I739 Peripheral vascular disease, unspecified: Secondary | ICD-10-CM | POA: Insufficient documentation

## 2016-03-19 DIAGNOSIS — M79609 Pain in unspecified limb: Secondary | ICD-10-CM | POA: Diagnosis present

## 2016-03-24 ENCOUNTER — Telehealth: Payer: Self-pay | Admitting: Gastroenterology

## 2016-03-24 ENCOUNTER — Other Ambulatory Visit: Payer: Self-pay | Admitting: *Deleted

## 2016-03-24 DIAGNOSIS — D509 Iron deficiency anemia, unspecified: Secondary | ICD-10-CM

## 2016-03-24 DIAGNOSIS — J3089 Other allergic rhinitis: Secondary | ICD-10-CM | POA: Diagnosis not present

## 2016-03-24 DIAGNOSIS — J301 Allergic rhinitis due to pollen: Secondary | ICD-10-CM | POA: Diagnosis not present

## 2016-03-24 NOTE — Telephone Encounter (Signed)
Patient given results and recommendations. He has not seen the capsule pass in stool. Do you want an abdominal xray?

## 2016-03-24 NOTE — Telephone Encounter (Signed)
Labs in [facility] for 04/06/16. Left a message for patient to call back.

## 2016-03-24 NOTE — Telephone Encounter (Signed)
No I don't think so. The capsule safely passed into his colon and likely is already out of his system. Thanks

## 2016-03-24 NOTE — Telephone Encounter (Signed)
Brandon Haynes can you please let this patient know his capsule endoscopy did not show any significant pathology to have caused his iron deficiency. He had some diminutive erosion in the small bowel but no ulcerations. His EGD and colonoscopy otherwise did not reveal a clear source. Iron deficiency was noted in the setting of plavix use, he should have been on iron now for over a month. I would like to repeat a CBC with ferritin and iron panel in 2-3 weeks to see if blood work is stable. I don't think any further evaluation is needed at this time if his blood work has improved on iron, although did not see a clear source for it on this workup. He is in need of a colonoscopy one year from his last exam due to multiple adenomas. Can you please let him know? Thanks

## 2016-03-24 NOTE — Telephone Encounter (Signed)
Patient notified

## 2016-03-25 ENCOUNTER — Encounter: Payer: Self-pay | Admitting: Family Medicine

## 2016-04-02 DIAGNOSIS — J301 Allergic rhinitis due to pollen: Secondary | ICD-10-CM | POA: Diagnosis not present

## 2016-04-02 DIAGNOSIS — J3089 Other allergic rhinitis: Secondary | ICD-10-CM | POA: Diagnosis not present

## 2016-04-06 ENCOUNTER — Other Ambulatory Visit (INDEPENDENT_AMBULATORY_CARE_PROVIDER_SITE_OTHER): Payer: Medicare HMO

## 2016-04-06 DIAGNOSIS — D509 Iron deficiency anemia, unspecified: Secondary | ICD-10-CM

## 2016-04-06 LAB — FERRITIN: FERRITIN: 29.2 ng/mL (ref 22.0–322.0)

## 2016-04-06 LAB — CBC WITH DIFFERENTIAL/PLATELET
BASOS ABS: 0 10*3/uL (ref 0.0–0.1)
BASOS PCT: 0.3 % (ref 0.0–3.0)
EOS ABS: 0.4 10*3/uL (ref 0.0–0.7)
Eosinophils Relative: 4.7 % (ref 0.0–5.0)
HCT: 42.3 % (ref 39.0–52.0)
Hemoglobin: 14.2 g/dL (ref 13.0–17.0)
LYMPHS PCT: 26.6 % (ref 12.0–46.0)
Lymphs Abs: 2.1 10*3/uL (ref 0.7–4.0)
MCHC: 33.6 g/dL (ref 30.0–36.0)
MCV: 87.1 fl (ref 78.0–100.0)
MONO ABS: 0.6 10*3/uL (ref 0.1–1.0)
Monocytes Relative: 7.3 % (ref 3.0–12.0)
NEUTROS ABS: 4.9 10*3/uL (ref 1.4–7.7)
NEUTROS PCT: 61.1 % (ref 43.0–77.0)
PLATELETS: 157 10*3/uL (ref 150.0–400.0)
RBC: 4.85 Mil/uL (ref 4.22–5.81)
RDW: 18.3 % — AB (ref 11.5–15.5)
WBC: 8 10*3/uL (ref 4.0–10.5)

## 2016-04-06 LAB — IBC PANEL
IRON: 146 ug/dL (ref 42–165)
Saturation Ratios: 41.5 % (ref 20.0–50.0)
TRANSFERRIN: 251 mg/dL (ref 212.0–360.0)

## 2016-04-08 ENCOUNTER — Other Ambulatory Visit: Payer: Self-pay

## 2016-04-08 DIAGNOSIS — J301 Allergic rhinitis due to pollen: Secondary | ICD-10-CM | POA: Diagnosis not present

## 2016-04-08 DIAGNOSIS — J3089 Other allergic rhinitis: Secondary | ICD-10-CM | POA: Diagnosis not present

## 2016-04-08 DIAGNOSIS — D509 Iron deficiency anemia, unspecified: Secondary | ICD-10-CM

## 2016-04-15 ENCOUNTER — Ambulatory Visit (INDEPENDENT_AMBULATORY_CARE_PROVIDER_SITE_OTHER): Payer: Medicare HMO | Admitting: Podiatry

## 2016-04-15 ENCOUNTER — Encounter: Payer: Self-pay | Admitting: Podiatry

## 2016-04-15 DIAGNOSIS — B351 Tinea unguium: Secondary | ICD-10-CM

## 2016-04-15 DIAGNOSIS — M79676 Pain in unspecified toe(s): Secondary | ICD-10-CM

## 2016-04-15 NOTE — Progress Notes (Signed)
Patient ID: Brandon Haynes, male   DOB: [birth year], 75 y.o.   MRN: [id number] Complaint:  Visit Type: Patient returns to my office for continued preventative foot care services. Complaint: Patient states" my nails have grown long and thick and become painful to walk and wear shoes" . The patient presents for preventative foot care services. No changes to ROS.  He has been applying topical fungal medicine and he says his toes are better. He says he is no longer ashamed of his toenails.  Podiatric Exam: Vascular: dorsalis pedis and posterior tibial pulses are palpable bilateral. Capillary return is immediate. Temperature gradient is WNL. Skin turgor WNL  Sensorium: Normal Semmes Weinstein monofilament test. Normal tactile sensation bilaterally. Nail Exam: Pt has thick disfigured discolored nails with subungual debris noted bilateral entire nail hallux through fifth toenails Ulcer Exam: There is no evidence of ulcer or pre-ulcerative changes or infection. Orthopedic Exam: Muscle tone and strength are WNL. No limitations in general ROM. No crepitus or effusions noted. Foot type and digits show no abnormalities. Bony prominences are unremarkable.Severe arthritis 1st MPJ B/L and hammer toe second right foot. Skin: No Porokeratosis. No infection or ulcers  Diagnosis:     Onychomycosis, , Pain in right toe, pain in left toes  Treatment & Plan Procedures and Treatment: Consent by patient was obtained for treatment procedures. The patient understood the discussion of treatment and procedures well. All questions were answered thoroughly reviewed. Debridement of mycotic and hypertrophic toenails, 1 through 5 bilateral and clearing of subungual debris. No ulceration, no infection noted.  Return Visit-Office Procedure: Patient instructed to return to the office for a follow up visit 3 months for continued evaluation and treatment.    Gardiner Barefoot DPM

## 2016-04-16 ENCOUNTER — Encounter: Payer: Self-pay | Admitting: Family Medicine

## 2016-04-16 ENCOUNTER — Ambulatory Visit (INDEPENDENT_AMBULATORY_CARE_PROVIDER_SITE_OTHER): Payer: Medicare HMO | Admitting: Family Medicine

## 2016-04-16 VITALS — BP 114/73 | HR 47 | Temp 97.9°F | Resp 16 | Ht 74.0 in | Wt 209.5 lb

## 2016-04-16 DIAGNOSIS — J3089 Other allergic rhinitis: Secondary | ICD-10-CM | POA: Diagnosis not present

## 2016-04-16 DIAGNOSIS — E7800 Pure hypercholesterolemia, unspecified: Secondary | ICD-10-CM | POA: Diagnosis not present

## 2016-04-16 DIAGNOSIS — J301 Allergic rhinitis due to pollen: Secondary | ICD-10-CM | POA: Diagnosis not present

## 2016-04-16 DIAGNOSIS — G8929 Other chronic pain: Secondary | ICD-10-CM

## 2016-04-16 DIAGNOSIS — M545 Low back pain, unspecified: Secondary | ICD-10-CM

## 2016-04-16 DIAGNOSIS — R7303 Prediabetes: Secondary | ICD-10-CM

## 2016-04-16 DIAGNOSIS — I10 Essential (primary) hypertension: Secondary | ICD-10-CM

## 2016-04-16 DIAGNOSIS — D509 Iron deficiency anemia, unspecified: Secondary | ICD-10-CM

## 2016-04-16 MED ORDER — TRAMADOL HCL 50 MG PO TABS: 50.0000 mg | ORAL_TABLET | Freq: Three times a day (TID) | ORAL | Status: DC | PRN

## 2016-04-16 NOTE — Progress Notes (Signed)
Pre visit review using our clinic review tool, if applicable. No additional management support is needed unless otherwise documented below in the visit note. 

## 2016-04-16 NOTE — Progress Notes (Signed)
OFFICE VISIT  04/16/2016   CC:  Chief Complaint  Patient presents with  . Follow-up    Pt is not fasting.   HPI:    Patient is a 75 y.o. Caucasian male who presents for 6 mo f/u HTN, hyperlipidemia, prediabetes. Recent full eval for source of GI bleeding was unrevealing (pt has IDA in the setting of chronic ASA and plavix use).  Iron PO helping raise levels.  BP: no home bps to report today.  He is compliant with all bp meds + low Na diet.  Hyperlip: tolerating daily crestor 20mg  w/out side effect.  He saw cardiologist recently as well.  Lipids/electrolytes/renal function/hepatic panel all good. He is to stay on ASA and plavix indefinitely, and his protonix was increased to 20mg  BID after EGD showed some gastritis recently.  He is trying to change diet due to his recent dx of prediabetes.  Last Hba1c 01/2016 was improved compared to 09/2015.    He has chronic LB pain/DDD, can no longer get injections b/c of being on ASA and plavix. No radicular pain as long as he uses gabapentin as prescribed.  Uses tramadol, 2-3 tabs a day as needed and this helps significantly.  He tries to keep busy/active during his day but he has no formal exercise or stretching regimen.  Past Medical History  Diagnosis Date  . Hypertension   . Hypercholesteremia   . Hearing loss   . Allergic rhinitis     allergy shots via [place]  . Peripheral vascular disease ([place])   . GERD (gastroesophageal reflux disease)   . Hx of colonic polyps   . DDD (degenerative disc disease), lumbar   . Gout   . Chronic low back pain   . Anxiety   . History  of basal cell carcinoma   . Pleural thickening     chronic  . 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 CAD, multiple vessel     a. CP/near-syncope 11/2014 s/p DES to MLAD, occluded RCA after takeoff of RV marginal with collaterals treated medically, otherwise mild nonobstructive disease. EF 55%;  b. 07/2015 Lexiscan CL: EF 56%, no ischemia/infarct.  . Prediabetes      HbA1c 6 % 09/2015.  Marland Kitchen Allergy   . Cancer ([place])     skin  . Iron deficiency anemia 2017    Colonoscopy, EGD, and capsule endoscopy w/out obvious cause/source found.  Oral iron helping as of 03/2016.  . Leg pain 03/06/2016    Past Surgical History  Procedure Laterality Date  . Back injection  oct 2011, 07/2010, 12/2012    no surgery  . Shoulder injection  feb 2014  . Rotator cuff repair Left 09/2013    left  . Cardiovascular stress test  05/2009; 07/2015  . Colonoscopy  08/2008; 02/2016    2017: 12 polyps (adenomatous)-recall 1 yr per Dr. Raynelle Highland.  Marland Kitchen Left heart catheterization with coronary angiogram N/A 12/04/2014    Procedure: LEFT HEART CATHETERIZATION WITH CORONARY ANGIOGRAM;  Surgeon: Jolaine Artist, MD; LAD 99% then aneurysmal, OM1 40%, CFX 50%, RCA 40%, 50% then occluded  . Coronary angioplasty with stent placement  12/04/2014    4.0 x 12 mm Synergy stent to the mid LAD  . Carotid dopplers  11/2014    Bilateral - 1% to 9% ICA stenosis. Vertebral artery flow is  . Transthoracic echocardiogram  11/2014    EF 50-55%, some wall motion abnormalities noted, grade I diast dysfxn  . Le arterial vascular study  02/2016  Per Dr. Radford Pax, no evidence of peripheral vascular disease  . Esophagogastroduodenoscopy  02/2016    Gastritis.  NEG h pylori.    . Capsule endoscopy  02/2016    mild duodenitis, o/w NEG    Outpatient Prescriptions Prior to Visit  Medication Sig Dispense Refill  . allopurinol (ZYLOPRIM) 300 MG tablet Take 1 tablet by mouth  daily 90 tablet 1  . aspirin 81 MG tablet Take 81 mg by mouth daily.      . clopidogrel (PLAVIX) 75 MG tablet Take 1 tablet (75 mg total) by mouth daily. 90 tablet 3  . clotrimazole-betamethasone (LOTRISONE) cream Apply 1 application topically daily.  1  . desonide (DESOWEN) 0.05 % cream Apply 1 application topically daily. Reported on 02/27/2016  1  . diphenhydrAMINE (BENADRYL) 25 MG tablet Take 50 mg by mouth 3 (three) times daily as needed  for allergies.     Marland Kitchen EPIPEN 2-PAK 0.3 MG/0.3ML SOAJ injection Use as directed  1  . ferrous sulfate 325 (65 FE) MG tablet Take 1 tablet (325 mg total) by mouth daily with breakfast. 30 tablet 3  . fluticasone (FLONASE) 50 MCG/ACT nasal spray Place 1 spray into both nostrils daily.    Marland Kitchen gabapentin (NEURONTIN) 300 MG capsule Take 300 mg by mouth 4 (four) times daily.     Marland Kitchen loratadine (CLARITIN) 10 MG tablet Take 10 mg by mouth daily as needed for allergies. As needed    . metoprolol tartrate (LOPRESSOR) 25 MG tablet Take 1 tablet by mouth two  times daily 180 tablet 3  . nitroGLYCERIN (NITROSTAT) 0.4 MG SL tablet Place 1 tablet (0.4 mg total) under the tongue every 5 (five) minutes as needed for chest pain. 25 tablet 3  . pantoprazole (PROTONIX) 20 MG tablet Take one tablet by mouth twice daily x 2 weeks then once daily    . PREVIDENT 5000 BOOSTER PLUS 1.1 % PSTE Take by mouth as directed. APPLY TO TEETH AT BEDTIME  1  . rosuvastatin (CRESTOR) 20 MG tablet Take 1 tablet by mouth  daily 90 tablet 3  . sildenafil (VIAGRA) 100 MG tablet Take 1 tablet (100 mg total) by mouth as needed for erectile dysfunction. 10 tablet 6  . valsartan (DIOVAN) 160 MG tablet Take 1 and 1/2 tablets by  mouth daily 135 tablet 3  . traMADol (ULTRAM) 50 MG tablet Take 1 tablet (50 mg total) by mouth 3 (three) times daily as needed. for pain 270 tablet 1  . Olopatadine HCl 0.6 % SOLN Place 1 puff into the nose 2 (two) times daily. Reported on 04/16/2016     No facility-administered medications prior to visit.    No Known Allergies  ROS As per HPI  PE: Blood pressure 114/73, pulse 47, temperature 97.9 F (36.6 C), temperature source Oral, resp. rate 16, height 6\' 2"  (1.88 m), weight 209 lb 8 oz (95.029 kg), SpO2 98 %. Gen: Alert, well appearing.  Patient is oriented to person, place, time, and situation. [id number]: no injection, icteris, swelling, or exudate.  EOMI, PERRLA. Mouth: lips without lesion/swelling.  Oral  mucosa pink and moist. Oropharynx without erythema, exudate, or swelling.  CV: Regular, bradycardic, distant S1 and S2, no m/r/g Chest is clear, no wheezing or rales. Normal symmetric air entry throughout both lung fields. No chest wall deformities or tenderness. EXT: no clubbing, cyanosis, or edema.   LABS:  Lab Results  Component Value Date   TSH 1.29 03/22/2014   Lab Results  Component Value Date  WBC 8.0 04/06/2016   HGB 14.2 04/06/2016   HCT 42.3 04/06/2016   MCV 87.1 04/06/2016   PLT 157.0 04/06/2016   Lab Results  Component Value Date   IRON 146 04/06/2016   FERRITIN 29.2 04/06/2016    Lab Results  Component Value Date   CREATININE 1.07 03/06/2016   BUN 9 03/06/2016   NA 141 03/06/2016   K 3.9 03/06/2016   CL 104 03/06/2016   CO2 27 03/06/2016   Lab Results  Component Value Date   ALT 13 03/06/2016   AST 20 03/06/2016   ALKPHOS 46 03/06/2016   BILITOT 0.8 03/06/2016   Lab Results  Component Value Date   CHOL 139 03/06/2016   Lab Results  Component Value Date   HDL 54 03/06/2016   Lab Results  Component Value Date   LDLCALC 64 03/06/2016   Lab Results  Component Value Date   TRIG 105 03/06/2016   Lab Results  Component Value Date   CHOLHDL 2.6 03/06/2016   Lab Results  Component Value Date   HGBA1C 5.8 02/10/2016    IMPRESSION AND PLAN:  1) Chronic low back pain/DDD: doing well on neurontin 300 mg qid + tramadol 50mg  tid prn. No NSAIDs due to being on ASA and plavix. RF'd tramadol today.  2) Prediabetes: latest A1c improved. Continue low carb diet.  3) Iron def anemia: improving with oral iron replacement.  No obvious GI source of bleeding was found on EGD/capsule endoscopy/colonoscopy.  His hemoccults prior to these procedures had been negative.  His GI MD has plans to recheck iron/cbc in a few months.  4) HTN: The current medical regimen is effective;  continue present plan and medications. Lytes/cr stable last month.  5)  Hyperlipidemia: tolerating statin.  Lipids excellent, hepatic panel normal last month.  An After Visit Summary was printed and given to the patient.  FOLLOW UP: Return in about 6 months (around 10/17/2016) for annual CPE (fasting).  Signed:  Crissie Sickles, MD           04/16/2016

## 2016-04-20 DIAGNOSIS — J301 Allergic rhinitis due to pollen: Secondary | ICD-10-CM | POA: Diagnosis not present

## 2016-04-22 ENCOUNTER — Other Ambulatory Visit: Payer: Self-pay | Admitting: Family Medicine

## 2016-04-22 DIAGNOSIS — J3089 Other allergic rhinitis: Secondary | ICD-10-CM | POA: Diagnosis not present

## 2016-04-22 DIAGNOSIS — J301 Allergic rhinitis due to pollen: Secondary | ICD-10-CM | POA: Diagnosis not present

## 2016-04-22 NOTE — Telephone Encounter (Signed)
RF request for allouprinol LOV: 04/16/16 Next ov: 10/20/16 Last written: 11/26/15 #90 w/ 1RF  Please advise. Thanks.

## 2016-04-27 DIAGNOSIS — J3089 Other allergic rhinitis: Secondary | ICD-10-CM | POA: Diagnosis not present

## 2016-04-27 DIAGNOSIS — J301 Allergic rhinitis due to pollen: Secondary | ICD-10-CM | POA: Diagnosis not present

## 2016-05-04 DIAGNOSIS — J3089 Other allergic rhinitis: Secondary | ICD-10-CM | POA: Diagnosis not present

## 2016-05-04 DIAGNOSIS — J301 Allergic rhinitis due to pollen: Secondary | ICD-10-CM | POA: Diagnosis not present

## 2016-05-12 DIAGNOSIS — J3089 Other allergic rhinitis: Secondary | ICD-10-CM | POA: Diagnosis not present

## 2016-05-13 DIAGNOSIS — J301 Allergic rhinitis due to pollen: Secondary | ICD-10-CM | POA: Diagnosis not present

## 2016-05-13 DIAGNOSIS — J3089 Other allergic rhinitis: Secondary | ICD-10-CM | POA: Diagnosis not present

## 2016-05-15 DIAGNOSIS — J301 Allergic rhinitis due to pollen: Secondary | ICD-10-CM | POA: Diagnosis not present

## 2016-05-20 DIAGNOSIS — J3089 Other allergic rhinitis: Secondary | ICD-10-CM | POA: Diagnosis not present

## 2016-05-20 DIAGNOSIS — J301 Allergic rhinitis due to pollen: Secondary | ICD-10-CM | POA: Diagnosis not present

## 2016-05-22 DIAGNOSIS — J301 Allergic rhinitis due to pollen: Secondary | ICD-10-CM | POA: Diagnosis not present

## 2016-05-22 DIAGNOSIS — J3089 Other allergic rhinitis: Secondary | ICD-10-CM | POA: Diagnosis not present

## 2016-05-26 DIAGNOSIS — J3089 Other allergic rhinitis: Secondary | ICD-10-CM | POA: Diagnosis not present

## 2016-05-26 DIAGNOSIS — J301 Allergic rhinitis due to pollen: Secondary | ICD-10-CM | POA: Diagnosis not present

## 2016-06-03 DIAGNOSIS — J3089 Other allergic rhinitis: Secondary | ICD-10-CM | POA: Diagnosis not present

## 2016-06-03 DIAGNOSIS — J301 Allergic rhinitis due to pollen: Secondary | ICD-10-CM | POA: Diagnosis not present

## 2016-06-04 DIAGNOSIS — J3089 Other allergic rhinitis: Secondary | ICD-10-CM | POA: Diagnosis not present

## 2016-06-04 DIAGNOSIS — K219 Gastro-esophageal reflux disease without esophagitis: Secondary | ICD-10-CM | POA: Diagnosis not present

## 2016-06-04 DIAGNOSIS — R21 Rash and other nonspecific skin eruption: Secondary | ICD-10-CM | POA: Diagnosis not present

## 2016-06-04 DIAGNOSIS — J301 Allergic rhinitis due to pollen: Secondary | ICD-10-CM | POA: Diagnosis not present

## 2016-06-05 DIAGNOSIS — J301 Allergic rhinitis due to pollen: Secondary | ICD-10-CM | POA: Diagnosis not present

## 2016-06-05 DIAGNOSIS — J3089 Other allergic rhinitis: Secondary | ICD-10-CM | POA: Diagnosis not present

## 2016-06-10 DIAGNOSIS — J3089 Other allergic rhinitis: Secondary | ICD-10-CM | POA: Diagnosis not present

## 2016-06-10 DIAGNOSIS — J301 Allergic rhinitis due to pollen: Secondary | ICD-10-CM | POA: Diagnosis not present

## 2016-06-12 DIAGNOSIS — J3089 Other allergic rhinitis: Secondary | ICD-10-CM | POA: Diagnosis not present

## 2016-06-12 DIAGNOSIS — J301 Allergic rhinitis due to pollen: Secondary | ICD-10-CM | POA: Diagnosis not present

## 2016-06-15 DIAGNOSIS — J3089 Other allergic rhinitis: Secondary | ICD-10-CM | POA: Diagnosis not present

## 2016-06-15 DIAGNOSIS — J301 Allergic rhinitis due to pollen: Secondary | ICD-10-CM | POA: Diagnosis not present

## 2016-06-16 DIAGNOSIS — Z23 Encounter for immunization: Secondary | ICD-10-CM | POA: Diagnosis not present

## 2016-06-23 ENCOUNTER — Telehealth: Payer: Self-pay | Admitting: Family Medicine

## 2016-06-23 DIAGNOSIS — H9313 Tinnitus, bilateral: Secondary | ICD-10-CM

## 2016-06-23 NOTE — Telephone Encounter (Signed)
Patient requesting referral for ENT for ringing in his ears.  Please advise.

## 2016-06-23 NOTE — Telephone Encounter (Signed)
Referral to ENT ordered as per pt request.

## 2016-06-24 DIAGNOSIS — J3089 Other allergic rhinitis: Secondary | ICD-10-CM | POA: Diagnosis not present

## 2016-06-24 DIAGNOSIS — J301 Allergic rhinitis due to pollen: Secondary | ICD-10-CM | POA: Diagnosis not present

## 2016-06-30 DIAGNOSIS — H9313 Tinnitus, bilateral: Secondary | ICD-10-CM | POA: Insufficient documentation

## 2016-06-30 DIAGNOSIS — H903 Sensorineural hearing loss, bilateral: Secondary | ICD-10-CM | POA: Insufficient documentation

## 2016-06-30 DIAGNOSIS — J3089 Other allergic rhinitis: Secondary | ICD-10-CM | POA: Diagnosis not present

## 2016-06-30 DIAGNOSIS — H93A3 Pulsatile tinnitus, bilateral: Secondary | ICD-10-CM | POA: Diagnosis not present

## 2016-06-30 DIAGNOSIS — J301 Allergic rhinitis due to pollen: Secondary | ICD-10-CM | POA: Diagnosis not present

## 2016-07-06 DIAGNOSIS — J301 Allergic rhinitis due to pollen: Secondary | ICD-10-CM | POA: Diagnosis not present

## 2016-07-06 DIAGNOSIS — J3089 Other allergic rhinitis: Secondary | ICD-10-CM | POA: Diagnosis not present

## 2016-07-08 ENCOUNTER — Encounter: Payer: Self-pay | Admitting: Podiatry

## 2016-07-08 ENCOUNTER — Ambulatory Visit (INDEPENDENT_AMBULATORY_CARE_PROVIDER_SITE_OTHER): Payer: Medicare HMO | Admitting: Podiatry

## 2016-07-08 DIAGNOSIS — M79676 Pain in unspecified toe(s): Secondary | ICD-10-CM

## 2016-07-08 DIAGNOSIS — B351 Tinea unguium: Secondary | ICD-10-CM

## 2016-07-08 NOTE — Progress Notes (Signed)
Patient ID: Brandon Haynes, male   DOB: 09/28/1941, 75 y.o.   MRN: [id number] Complaint:  Visit Type: Patient returns to my office for continued preventative foot care services. Complaint: Patient states" my nails have grown long and thick and become painful to walk and wear shoes" . The patient presents for preventative foot care services. No changes to ROS.  He has been applying topical fungal medicine and he says his toes are better.  Podiatric Exam: Vascular: dorsalis pedis and posterior tibial pulses are palpable bilateral. Capillary return is immediate. Temperature gradient is WNL. Skin turgor WNL  Sensorium: Normal Semmes Weinstein monofilament test. Normal tactile sensation bilaterally. Nail Exam: Pt has thick disfigured discolored nails with subungual debris noted bilateral entire nail hallux through fifth toenails Ulcer Exam: There is no evidence of ulcer or pre-ulcerative changes or infection. Orthopedic Exam: Muscle tone and strength are WNL. No limitations in general ROM. No crepitus or effusions noted. Foot type and digits show no abnormalities. Bony prominences are unremarkable.Severe arthritis 1st MPJ B/L and hammer toe second right foot. Skin: No Porokeratosis. No infection or ulcers  Diagnosis:     Onychomycosis, , Pain in right toe, pain in left toes  Treatment & Plan Procedures and Treatment: Consent by patient was obtained for treatment procedures. The patient understood the discussion of treatment and procedures well. All questions were answered thoroughly reviewed. Debridement of mycotic and hypertrophic toenails, 1 through 5 bilateral and clearing of subungual debris. No ulceration, no infection noted.  Return Visit-Office Procedure: Patient instructed to return to the office for a follow up visit 3 months for continued evaluation and treatment.    Yunique Dearcos DPM 

## 2016-07-15 ENCOUNTER — Other Ambulatory Visit: Payer: Self-pay | Admitting: Gastroenterology

## 2016-07-15 DIAGNOSIS — J301 Allergic rhinitis due to pollen: Secondary | ICD-10-CM | POA: Diagnosis not present

## 2016-07-15 DIAGNOSIS — J3089 Other allergic rhinitis: Secondary | ICD-10-CM | POA: Diagnosis not present

## 2016-07-21 DIAGNOSIS — J3089 Other allergic rhinitis: Secondary | ICD-10-CM | POA: Diagnosis not present

## 2016-07-21 DIAGNOSIS — J301 Allergic rhinitis due to pollen: Secondary | ICD-10-CM | POA: Diagnosis not present

## 2016-07-26 DIAGNOSIS — C4492 Squamous cell carcinoma of skin, unspecified: Secondary | ICD-10-CM

## 2016-07-26 HISTORY — DX: Squamous cell carcinoma of skin, unspecified: C44.92

## 2016-07-28 DIAGNOSIS — J301 Allergic rhinitis due to pollen: Secondary | ICD-10-CM | POA: Diagnosis not present

## 2016-07-28 DIAGNOSIS — J3089 Other allergic rhinitis: Secondary | ICD-10-CM | POA: Diagnosis not present

## 2016-08-04 DIAGNOSIS — J3089 Other allergic rhinitis: Secondary | ICD-10-CM | POA: Diagnosis not present

## 2016-08-04 DIAGNOSIS — J301 Allergic rhinitis due to pollen: Secondary | ICD-10-CM | POA: Diagnosis not present

## 2016-08-10 DIAGNOSIS — J3089 Other allergic rhinitis: Secondary | ICD-10-CM | POA: Diagnosis not present

## 2016-08-10 DIAGNOSIS — J301 Allergic rhinitis due to pollen: Secondary | ICD-10-CM | POA: Diagnosis not present

## 2016-08-14 ENCOUNTER — Other Ambulatory Visit: Payer: Self-pay | Admitting: Gastroenterology

## 2016-08-14 NOTE — Telephone Encounter (Signed)
Pt requesting refills on his Iron 325 mg tabs one a day. Last seen 02-2016 and has no current follow up. Please advise.

## 2016-08-14 NOTE — Telephone Encounter (Signed)
Thanks Brandon Haynes. I would like him to have a repeat CBC and iron levels / ferritin if you can help coordinate. Otherwise okay to refill iron. thanks

## 2016-08-17 ENCOUNTER — Other Ambulatory Visit: Payer: Self-pay

## 2016-08-17 DIAGNOSIS — J3089 Other allergic rhinitis: Secondary | ICD-10-CM | POA: Diagnosis not present

## 2016-08-17 DIAGNOSIS — J301 Allergic rhinitis due to pollen: Secondary | ICD-10-CM | POA: Diagnosis not present

## 2016-08-17 DIAGNOSIS — D509 Iron deficiency anemia, unspecified: Secondary | ICD-10-CM

## 2016-08-17 NOTE — Telephone Encounter (Signed)
Pt returned call. He states understanding and aware to have labs drawn.

## 2016-08-17 NOTE — Telephone Encounter (Signed)
Left a detailed message for pt on voicemail with information about having labs. Informed to call if he had any questions.

## 2016-08-18 ENCOUNTER — Other Ambulatory Visit (INDEPENDENT_AMBULATORY_CARE_PROVIDER_SITE_OTHER): Payer: Medicare HMO

## 2016-08-18 DIAGNOSIS — D509 Iron deficiency anemia, unspecified: Secondary | ICD-10-CM

## 2016-08-18 LAB — CBC WITH DIFFERENTIAL/PLATELET
BASOS PCT: 0.7 % (ref 0.0–3.0)
Basophils Absolute: 0.1 10*3/uL (ref 0.0–0.1)
Eosinophils Absolute: 0.3 10*3/uL (ref 0.0–0.7)
Eosinophils Relative: 3.5 % (ref 0.0–5.0)
HEMATOCRIT: 46.3 % (ref 39.0–52.0)
HEMOGLOBIN: 16 g/dL (ref 13.0–17.0)
LYMPHS PCT: 31.9 % (ref 12.0–46.0)
Lymphs Abs: 2.6 10*3/uL (ref 0.7–4.0)
MCHC: 34.6 g/dL (ref 30.0–36.0)
MCV: 91.9 fl (ref 78.0–100.0)
MONOS PCT: 7 % (ref 3.0–12.0)
Monocytes Absolute: 0.6 10*3/uL (ref 0.1–1.0)
NEUTROS ABS: 4.6 10*3/uL (ref 1.4–7.7)
Neutrophils Relative %: 56.9 % (ref 43.0–77.0)
PLATELETS: 186 10*3/uL (ref 150.0–400.0)
RBC: 5.04 Mil/uL (ref 4.22–5.81)
RDW: 13.8 % (ref 11.5–15.5)
WBC: 8.1 10*3/uL (ref 4.0–10.5)

## 2016-08-18 LAB — IBC PANEL
IRON: 201 ug/dL — AB (ref 42–165)
Saturation Ratios: 66.5 % — ABNORMAL HIGH (ref 20.0–50.0)
TRANSFERRIN: 216 mg/dL (ref 212.0–360.0)

## 2016-08-18 LAB — FERRITIN: Ferritin: 74.6 ng/mL (ref 22.0–322.0)

## 2016-08-24 ENCOUNTER — Other Ambulatory Visit: Payer: Self-pay

## 2016-08-24 ENCOUNTER — Telehealth: Payer: Self-pay

## 2016-08-24 DIAGNOSIS — D509 Iron deficiency anemia, unspecified: Secondary | ICD-10-CM

## 2016-08-24 NOTE — Telephone Encounter (Signed)
Left message for pt to return call.

## 2016-08-24 NOTE — Telephone Encounter (Signed)
Pt informed of results. Orders placed for CBC w/ Diff and pt will return in 4-6 months for repeat labs.

## 2016-08-24 NOTE — Telephone Encounter (Signed)
-----   Message from Manus Gunning, MD sent at 08/20/2016 12:40 PM EST ----- Caryl Pina can you please let this patient that is iron stores and Hgb is normal. At this time he can reduce the amount of iron he takes, can take it every other day, or a few days per week. He can repeat a CBC in 4-6 months or so to ensure stable. Can you please let him know? Thanks

## 2016-08-24 NOTE — Telephone Encounter (Signed)
Pt informed of results and instructed to return in 4-6 months to repeat labs. Pt understood and order for CBC w/diff placed.

## 2016-08-25 DIAGNOSIS — J301 Allergic rhinitis due to pollen: Secondary | ICD-10-CM | POA: Diagnosis not present

## 2016-08-25 DIAGNOSIS — J3089 Other allergic rhinitis: Secondary | ICD-10-CM | POA: Diagnosis not present

## 2016-09-01 DIAGNOSIS — J301 Allergic rhinitis due to pollen: Secondary | ICD-10-CM | POA: Diagnosis not present

## 2016-09-01 DIAGNOSIS — J3089 Other allergic rhinitis: Secondary | ICD-10-CM | POA: Diagnosis not present

## 2016-09-08 ENCOUNTER — Ambulatory Visit (INDEPENDENT_AMBULATORY_CARE_PROVIDER_SITE_OTHER): Payer: Medicare HMO | Admitting: Cardiology

## 2016-09-08 ENCOUNTER — Encounter (INDEPENDENT_AMBULATORY_CARE_PROVIDER_SITE_OTHER): Payer: Self-pay

## 2016-09-08 ENCOUNTER — Encounter: Payer: Self-pay | Admitting: Cardiology

## 2016-09-08 VITALS — BP 132/84 | HR 56 | Ht 74.0 in | Wt 211.0 lb

## 2016-09-08 DIAGNOSIS — I2510 Atherosclerotic heart disease of native coronary artery without angina pectoris: Secondary | ICD-10-CM

## 2016-09-08 DIAGNOSIS — I10 Essential (primary) hypertension: Secondary | ICD-10-CM | POA: Diagnosis not present

## 2016-09-08 DIAGNOSIS — T50905A Adverse effect of unspecified drugs, medicaments and biological substances, initial encounter: Secondary | ICD-10-CM

## 2016-09-08 DIAGNOSIS — J301 Allergic rhinitis due to pollen: Secondary | ICD-10-CM | POA: Diagnosis not present

## 2016-09-08 DIAGNOSIS — E7800 Pure hypercholesterolemia, unspecified: Secondary | ICD-10-CM

## 2016-09-08 DIAGNOSIS — R001 Bradycardia, unspecified: Secondary | ICD-10-CM | POA: Diagnosis not present

## 2016-09-08 DIAGNOSIS — J3089 Other allergic rhinitis: Secondary | ICD-10-CM | POA: Diagnosis not present

## 2016-09-08 HISTORY — DX: Adverse effect of unspecified drugs, medicaments and biological substances, initial encounter: T50.905A

## 2016-09-08 HISTORY — DX: Bradycardia, unspecified: R00.1

## 2016-09-08 NOTE — Progress Notes (Signed)
Cardiology Office Note    Date:  09/08/2016   ID:  Brandon Haynes, Brandon Haynes [birth year], MRN [id number]  PCP:  Tammi Sou, MD  Cardiologist:  Fransico Him, MD   Chief Complaint  Patient presents with  . Coronary Artery Disease  . Hypertension  . Hyperlipidemia    History of Present Illness:  Brandon Haynes is a 75 y.o. male  with history of 2 vessel ASCAD with a 99% LAD and an occluded RCA. The RCA was felt well collateralized.  He underwent PCI of the LAD. LV function was 55% at time of cath. Echocardiogram revealed akinesis of the basal-mid inferoseptal myocardium and  mid-apical anteroseptal myocardium.  His last nuclear stress test showed no ischemia. His angina has presented as CP and hypotension in the past.  Patient presents today for follow-up.  He has been active at home without any discomfort.  He denies anginal chest pain, SOB, DOE, dizziness, PND, orthopnea,  lower extremity edema.  He has chronic leg pain at night that is relieved with gabapentin, Tylenol and Tramadol.  He has significant DJD of the lumbar spine.  He has no claudication symptoms.    Past Medical History:  Diagnosis Date  . Allergic rhinitis    allergy shots via [place]  . Allergy   . Anxiety   . 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 CAD, multiple vessel    a. CP/near-syncope 11/2014 s/p DES to MLAD, occluded RCA after takeoff of RV marginal with collaterals treated medically, otherwise mild nonobstructive disease. EF 55%;  b. 07/2015 Lexiscan CL: EF 56%, no ischemia/infarct.  . Cancer ([place])    skin  . Chronic low back pain   . DDD (degenerative disc disease), lumbar   . GERD (gastroesophageal reflux disease)   . Gout   . Hearing loss   . History  of basal cell carcinoma   . Hx of colonic polyps   . Hypercholesteremia   . Hypertension   . Iron deficiency anemia 2017   Colonoscopy, EGD, and capsule endoscopy w/out obvious cause/source found.  Oral iron helping as of 03/2016.  . Leg pain  03/06/2016  . 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 Peripheral vascular disease ([place])   . Pleural thickening    chronic  . Prediabetes    HbA1c 6 % 09/2015.    Past Surgical History:  Procedure Laterality Date  . back injection  oct 2011, 07/2010, 12/2012   no surgery  . Capsule endoscopy  02/2016   mild duodenitis, o/w NEG  . CARDIOVASCULAR STRESS TEST  05/2009; 07/2015  . carotid dopplers  11/2014   Bilateral - 1% to 9% ICA stenosis. Vertebral artery flow is  . COLONOSCOPY  08/2008; 02/2016   2017: 12 polyps (adenomatous)-recall 1 yr per Dr. Raynelle Highland.  Marland Kitchen CORONARY ANGIOPLASTY WITH STENT PLACEMENT  12/04/2014   4.0 x 12 mm Synergy stent to the mid LAD  . ESOPHAGOGASTRODUODENOSCOPY  02/2016   Gastritis.  NEG h pylori.    . LE arterial vascular study  02/2016   Per Dr. Radford Pax, no evidence of peripheral vascular disease  . LEFT HEART CATHETERIZATION WITH CORONARY ANGIOGRAM N/A 12/04/2014   Procedure: LEFT HEART CATHETERIZATION WITH CORONARY ANGIOGRAM;  Surgeon: Jolaine Artist, MD; LAD 99% then aneurysmal, OM1 40%, CFX 50%, RCA 40%, 50% then occluded  . ROTATOR CUFF REPAIR Left 09/2013   left  . SHOULDER INJECTION  feb 2014  . TRANSTHORACIC ECHOCARDIOGRAM  11/2014   EF 50-55%, some wall motion  abnormalities noted, grade I diast dysfxn    Current Medications: Outpatient Medications Prior to Visit  Medication Sig Dispense Refill  . aspirin 81 MG tablet Take 81 mg by mouth daily.      . clopidogrel (PLAVIX) 75 MG tablet Take 1 tablet (75 mg total) by mouth daily. 90 tablet 3  . clotrimazole-betamethasone (LOTRISONE) cream Apply 1 application topically daily.  1  . desonide (DESOWEN) 0.05 % cream Apply 1 application topically daily. Reported on 02/27/2016  1  . diphenhydrAMINE (BENADRYL) 25 MG tablet Take 50 mg by mouth 3 (three) times daily as needed for allergies.     Marland Kitchen EPIPEN 2-PAK 0.3 MG/0.3ML SOAJ injection Use as directed  1  . fluticasone (FLONASE) 50 MCG/ACT nasal spray  Place 1 spray into both nostrils daily.    Marland Kitchen gabapentin (NEURONTIN) 300 MG capsule Take 300 mg by mouth 4 (four) times daily.     Marland Kitchen loratadine (CLARITIN) 10 MG tablet Take 10 mg by mouth daily as needed for allergies. As needed    . metoprolol tartrate (LOPRESSOR) 25 MG tablet Take 1 tablet by mouth two  times daily 180 tablet 3  . nitroGLYCERIN (NITROSTAT) 0.4 MG SL tablet Place 1 tablet (0.4 mg total) under the tongue every 5 (five) minutes as needed for chest pain. 25 tablet 3  . PREVIDENT 5000 BOOSTER PLUS 1.1 % PSTE Take by mouth as directed. APPLY TO TEETH AT BEDTIME  1  . rosuvastatin (CRESTOR) 20 MG tablet Take 1 tablet by mouth  daily 90 tablet 3  . traMADol (ULTRAM) 50 MG tablet Take 1 tablet (50 mg total) by mouth 3 (three) times daily as needed. for pain 270 tablet 1  . valsartan (DIOVAN) 160 MG tablet Take 1 and 1/2 tablets by  mouth daily 135 tablet 3  . allopurinol (ZYLOPRIM) 300 MG tablet TAKE 1 TABLET DAILY (Patient not taking: Reported on 09/08/2016) 90 tablet 3  . ferrous sulfate 325 (65 FE) MG tablet TAKE 1 TABLET(325 MG) BY MOUTH DAILY WITH BREAKFAST (Patient not taking: Reported on 09/08/2016) 30 tablet 0  . pantoprazole (PROTONIX) 20 MG tablet Take one tablet by mouth twice daily x 2 weeks then once daily    . sildenafil (VIAGRA) 100 MG tablet Take 1 tablet (100 mg total) by mouth as needed for erectile dysfunction. 10 tablet 6   No facility-administered medications prior to visit.      Allergies:   Patient has no known allergies.   Social History   Social History  . Marital status: Married    Spouse name: Vaughan Basta  . Number of children: 3  . Years of education: N/A   Occupational History  . retired    Social History Main Topics  . Smoking status: Former Smoker    Packs/day: 2.00    Years: 35.00    Types: Cigarettes    Quit date: 09/28/1998  . Smokeless tobacco: Former Systems developer    Types: Snuff    Quit date: 02/09/1986     Comment: as a teenager  . Alcohol use No       Comment: quit 1994  . Drug use: No  . Sexual activity: Not Asked   Other Topics Concern  . None   Social History Narrative   Married, 3 children.   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   Orig from [place], lived [place] same farm all his life.   Former smoker: quit approx around [place].  80 pack-yr hx.  Alcohol-none, used to drink a lot of beer on weekends.  No drugs.   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   Diet: fair     Family History:  The patient's family history includes Breast cancer in his mother and sister; COPD in his brother; Congestive Heart Failure in his mother; Diabetes in his mother; Heart disease in his brother and father; Lung cancer in his father.   ROS:   Please see the history of present illness.    ROS All other systems reviewed and are negative.  No flowsheet data found.     PHYSICAL EXAM:   VS:  BP 132/84 (BP Location: Right Arm)   Pulse (!) 56   Ht 6\' 2"  (1.88 m)   Wt 211 lb (95.7 kg)   BMI 27.09 kg/m    GEN: Well nourished, well developed, in no acute distress  HEENT: normal  Neck: no JVD, carotid bruits, or masses Cardiac: RRR; no murmurs, rubs, or gallops,no edema.  Intact distal pulses bilaterally.  Respiratory:  clear to auscultation bilaterally, normal work of breathing GI: soft, nontender, nondistended, + BS MS: no deformity or atrophy  Skin: warm and dry, no rash Neuro:  Alert and Oriented x 3, Strength and sensation are intact Psych: euthymic mood, full affect  Wt Readings from Last 3 Encounters:  09/08/16 211 lb (95.7 kg)  04/16/16 209 lb 8 oz (95 kg)  03/06/16 209 lb 9.6 oz (95.1 kg)      Studies/Labs Reviewed:   EKG:  EKG is ordered today.  The ekg ordered today demonstrates sinus bradycardia at 56bpm with no ST changes  Recent Labs: 03/06/2016: ALT 13; BUN 9; Creat 1.07; Potassium 3.9; Sodium 141 08/18/2016: Hemoglobin 16.0; Platelets 186.0   Lipid Panel    Component Value Date/Time    CHOL 139 03/06/2016 1130   TRIG 105 03/06/2016 1130   HDL 54 03/06/2016 1130   CHOLHDL 2.6 03/06/2016 1130   VLDL 21 03/06/2016 1130   LDLCALC 64 03/06/2016 1130    Additional studies/ records that were reviewed today include:  none    ASSESSMENT:    1. Coronary artery disease involving native coronary artery of native heart without angina pectoris   2. Essential hypertension   3. HYPERCHOLESTEROLEMIA   4. Bradycardia, drug induced      PLAN:  In order of problems listed above:  1. 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 The RCA was felt well collateralized.  He underwent PCI of the LAD and nuclear stress test a year ago showed no ischemia.  He has not had any CP.  Continue ASA/Plavix/BB/statin. 2. HTN - BP controlled on current meds. Continue ARB/BB. 3. Hyperlipidemia - LDL goal < 70. Continue statin.  LDL at goal. 4. Drug induced bradycardia - he is asymptomatic.     Medication Adjustments/Labs and Tests Ordered: Current medicines are reviewed at length with the patient today.  Concerns regarding medicines are outlined above.  Medication changes, Labs and Tests ordered today are listed in the Patient Instructions below.  There are no Patient Instructions on file for this visit.   Signed, Fransico Him, MD  09/08/2016 11:15 AM    [address], [place], [place]  [address] Phone: [phone]; Fax: [phone]

## 2016-09-08 NOTE — Patient Instructions (Signed)

## 2016-09-14 ENCOUNTER — Other Ambulatory Visit: Payer: Self-pay | Admitting: *Deleted

## 2016-09-14 MED ORDER — NITROGLYCERIN 0.4 MG SL SUBL: 0.4000 mg | SUBLINGUAL_TABLET | SUBLINGUAL | 3 refills | Status: DC | PRN

## 2016-09-18 DIAGNOSIS — J3089 Other allergic rhinitis: Secondary | ICD-10-CM | POA: Diagnosis not present

## 2016-09-18 DIAGNOSIS — J301 Allergic rhinitis due to pollen: Secondary | ICD-10-CM | POA: Diagnosis not present

## 2016-09-24 DIAGNOSIS — J3089 Other allergic rhinitis: Secondary | ICD-10-CM | POA: Diagnosis not present

## 2016-09-24 DIAGNOSIS — J301 Allergic rhinitis due to pollen: Secondary | ICD-10-CM | POA: Diagnosis not present

## 2016-09-30 ENCOUNTER — Ambulatory Visit (INDEPENDENT_AMBULATORY_CARE_PROVIDER_SITE_OTHER): Payer: Self-pay | Admitting: Podiatry

## 2016-09-30 ENCOUNTER — Encounter: Payer: Self-pay | Admitting: Podiatry

## 2016-09-30 VITALS — Ht 74.0 in | Wt 211.0 lb

## 2016-09-30 DIAGNOSIS — M79676 Pain in unspecified toe(s): Secondary | ICD-10-CM

## 2016-09-30 DIAGNOSIS — J301 Allergic rhinitis due to pollen: Secondary | ICD-10-CM | POA: Diagnosis not present

## 2016-09-30 DIAGNOSIS — B351 Tinea unguium: Secondary | ICD-10-CM

## 2016-09-30 DIAGNOSIS — J3089 Other allergic rhinitis: Secondary | ICD-10-CM | POA: Diagnosis not present

## 2016-09-30 NOTE — Progress Notes (Signed)
Patient ID: Brandon Haynes, male   DOB: 03/28/1941, 75 y.o.   MRN: [id number] Complaint:  Visit Type: Patient returns to my office for continued preventative foot care services. Complaint: Patient states" my nails have grown long and thick and become painful to walk and wear shoes" . The patient presents for preventative foot care services. No changes to ROS.  He has been applying topical fungal medicine and he says his toes are better.  Podiatric Exam: Vascular: dorsalis pedis and posterior tibial pulses are palpable bilateral. Capillary return is immediate. Temperature gradient is WNL. Skin turgor WNL  Sensorium: Normal Semmes Weinstein monofilament test. Normal tactile sensation bilaterally. Nail Exam: Pt has thick disfigured discolored nails with subungual debris noted bilateral entire nail hallux through fifth toenails Ulcer Exam: There is no evidence of ulcer or pre-ulcerative changes or infection. Orthopedic Exam: Muscle tone and strength are WNL. No limitations in general ROM. No crepitus or effusions noted. Foot type and digits show no abnormalities. Bony prominences are unremarkable.Severe arthritis 1st MPJ B/L and hammer toe second right foot. Skin: No Porokeratosis. No infection or ulcers  Diagnosis:     Onychomycosis, , Pain in right toe, pain in left toes  Treatment & Plan Procedures and Treatment: Consent by patient was obtained for treatment procedures. The patient understood the discussion of treatment and procedures well. All questions were answered thoroughly reviewed. Debridement of mycotic and hypertrophic toenails, 1 through 5 bilateral and clearing of subungual debris. No ulceration, no infection noted.  Return Visit-Office Procedure: Patient instructed to return to the office for a follow up visit 3 months for continued evaluation and treatment.    Shirin Echeverry DPM 

## 2016-10-07 DIAGNOSIS — J301 Allergic rhinitis due to pollen: Secondary | ICD-10-CM | POA: Diagnosis not present

## 2016-10-07 DIAGNOSIS — J3089 Other allergic rhinitis: Secondary | ICD-10-CM | POA: Diagnosis not present

## 2016-10-13 DIAGNOSIS — J301 Allergic rhinitis due to pollen: Secondary | ICD-10-CM | POA: Diagnosis not present

## 2016-10-13 DIAGNOSIS — J3089 Other allergic rhinitis: Secondary | ICD-10-CM | POA: Diagnosis not present

## 2016-10-16 ENCOUNTER — Other Ambulatory Visit: Payer: Self-pay | Admitting: Cardiology

## 2016-10-19 DIAGNOSIS — J301 Allergic rhinitis due to pollen: Secondary | ICD-10-CM | POA: Diagnosis not present

## 2016-10-19 DIAGNOSIS — J3089 Other allergic rhinitis: Secondary | ICD-10-CM | POA: Diagnosis not present

## 2016-10-20 ENCOUNTER — Encounter: Payer: Self-pay | Admitting: Family Medicine

## 2016-10-20 ENCOUNTER — Ambulatory Visit (INDEPENDENT_AMBULATORY_CARE_PROVIDER_SITE_OTHER): Payer: Medicare HMO | Admitting: Family Medicine

## 2016-10-20 VITALS — BP 138/82 | HR 54 | Temp 97.5°F | Resp 16 | Ht 74.0 in | Wt 212.0 lb

## 2016-10-20 DIAGNOSIS — Z0000 Encounter for general adult medical examination without abnormal findings: Secondary | ICD-10-CM | POA: Diagnosis not present

## 2016-10-20 DIAGNOSIS — R7303 Prediabetes: Secondary | ICD-10-CM | POA: Diagnosis not present

## 2016-10-20 DIAGNOSIS — E7800 Pure hypercholesterolemia, unspecified: Secondary | ICD-10-CM | POA: Diagnosis not present

## 2016-10-20 LAB — CBC WITH DIFFERENTIAL/PLATELET
BASOS PCT: 1 %
Basophils Absolute: 76 cells/uL (ref 0–200)
EOS PCT: 4 %
Eosinophils Absolute: 304 cells/uL (ref 15–500)
HCT: 47.5 % (ref 38.5–50.0)
HEMOGLOBIN: 16.3 g/dL (ref 13.2–17.1)
LYMPHS ABS: 2204 {cells}/uL (ref 850–3900)
Lymphocytes Relative: 29 %
MCH: 31.9 pg (ref 27.0–33.0)
MCHC: 34.3 g/dL (ref 32.0–36.0)
MCV: 93 fL (ref 80.0–100.0)
MONOS PCT: 8 %
MPV: 10.7 fL (ref 7.5–12.5)
Monocytes Absolute: 608 cells/uL (ref 200–950)
Neutro Abs: 4408 cells/uL (ref 1500–7800)
Neutrophils Relative %: 58 %
PLATELETS: 167 10*3/uL (ref 140–400)
RBC: 5.11 MIL/uL (ref 4.20–5.80)
RDW: 13.9 % (ref 11.0–15.0)
WBC: 7.6 10*3/uL (ref 3.8–10.8)

## 2016-10-20 LAB — COMPREHENSIVE METABOLIC PANEL
ALBUMIN: 4.3 g/dL (ref 3.6–5.1)
ALK PHOS: 42 U/L (ref 40–115)
ALT: 12 U/L (ref 9–46)
AST: 17 U/L (ref 10–35)
BUN: 12 mg/dL (ref 7–25)
CO2: 29 mmol/L (ref 20–31)
CREATININE: 1.08 mg/dL (ref 0.70–1.18)
Calcium: 9.7 mg/dL (ref 8.6–10.3)
Chloride: 105 mmol/L (ref 98–110)
Glucose, Bld: 101 mg/dL — ABNORMAL HIGH (ref 65–99)
Potassium: 4.7 mmol/L (ref 3.5–5.3)
Sodium: 143 mmol/L (ref 135–146)
TOTAL PROTEIN: 6.6 g/dL (ref 6.1–8.1)
Total Bilirubin: 0.7 mg/dL (ref 0.2–1.2)

## 2016-10-20 LAB — LIPID PANEL
Cholesterol: 129 mg/dL (ref <200)
HDL: 51 mg/dL (ref >40)
LDL CALC: 54 mg/dL (ref <100)
Total CHOL/HDL Ratio: 2.5 Ratio (ref <5.0)
Triglycerides: 122 mg/dL (ref <150)
VLDL: 24 mg/dL (ref <30)

## 2016-10-20 NOTE — Progress Notes (Signed)
Pre visit review using our clinic review tool, if applicable. No additional management support is needed unless otherwise documented below in the visit note. 

## 2016-10-20 NOTE — Progress Notes (Signed)
Office Note 10/20/2016  CC:  Chief Complaint  Patient presents with  . Annual Exam    CPE    HPI:  Brandon Haynes is a 76 y.o. White male who is here for annual health maintenance exam. Eye exam UTD. Dental visits q 56mo. Exercise: minimal, but stays active.  Past Medical History:  Diagnosis Date  . Allergic rhinitis    allergy shots via [place]  . Allergy   . Anxiety   . 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 CAD, multiple vessel    a. CP/near-syncope 11/2014 s/p DES to MLAD, occluded RCA after takeoff of RV marginal with collaterals treated medically, otherwise mild nonobstructive disease. EF 55%;  b. 07/2015 Lexiscan CL: EF 56%, no ischemia/infarct.  . Cancer ([place])    skin  . Chronic low back pain   . DDD (degenerative disc disease), lumbar   . GERD (gastroesophageal reflux disease)   . Gout   . Hearing loss   . History  of basal cell carcinoma   . Hx of colonic polyps   . Hypercholesteremia   . Hypertension   . Iron deficiency anemia 2017   Colonoscopy, EGD, and capsule endoscopy w/out obvious cause/source found.  Oral iron helping as of 03/2016.  . Leg pain 03/06/2016  . 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 Peripheral vascular disease ([place])   . Pleural thickening    chronic  . Prediabetes    HbA1c 6 % 09/2015.    Past Surgical History:  Procedure Laterality Date  . back injection  oct 2011, 07/2010, 12/2012   no surgery  . Capsule endoscopy  02/2016   mild duodenitis, o/w NEG  . CARDIOVASCULAR STRESS TEST  05/2009; 07/2015  . carotid dopplers  11/2014   Bilateral - 1% to 9% ICA stenosis. Vertebral artery flow is  . COLONOSCOPY  08/2008; 02/2016   2017: 12 polyps (adenomatous)-recall 1 yr per Dr. Raynelle Highland.  Marland Kitchen CORONARY ANGIOPLASTY WITH STENT PLACEMENT  12/04/2014   4.0 x 12 mm Synergy stent to the mid LAD  . ESOPHAGOGASTRODUODENOSCOPY  02/2016   Gastritis.  NEG h pylori.    . LE arterial vascular study  02/2016   Per Dr. Radford Pax, no evidence of  peripheral vascular disease  . LEFT HEART CATHETERIZATION WITH CORONARY ANGIOGRAM N/A 12/04/2014   Procedure: LEFT HEART CATHETERIZATION WITH CORONARY ANGIOGRAM;  Surgeon: Jolaine Artist, MD; LAD 99% then aneurysmal, OM1 40%, CFX 50%, RCA 40%, 50% then occluded  . ROTATOR CUFF REPAIR Left 09/2013   left  . SHOULDER INJECTION  feb 2014  . TRANSTHORACIC ECHOCARDIOGRAM  11/2014   EF 50-55%, some wall motion abnormalities noted, grade I diast dysfxn    Family History  Problem Relation Age of Onset  . Lung cancer Father   . Heart disease Father   . Breast cancer Mother   . Congestive Heart Failure Mother   . Diabetes Mother   . Breast cancer Sister   . Heart disease Brother   . COPD Brother   . Colon cancer Neg Hx   . Esophageal cancer Neg Hx   . Pancreatic cancer Neg Hx   . Rectal cancer Neg Hx   . Stomach cancer Neg Hx   . Prostate cancer Neg Hx     Social History   Social History  . Marital status: Married    Spouse name: Vaughan Basta  . Number of children: 3  . Years of education: N/A   Occupational History  . retired  Social History Main Topics  . Smoking status: Former Smoker    Packs/day: 2.00    Years: 35.00    Types: Cigarettes    Quit date: 09/28/1998  . Smokeless tobacco: Former Systems developer    Types: Snuff    Quit date: 02/09/1986     Comment: as a teenager  . Alcohol use No     Comment: quit 1994  . Drug use: No  . Sexual activity: Not on file   Other Topics Concern  . Not on file   Social History Narrative   Married, 3 children.   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   Orig from [place], lived [place] same farm all his life.   Former smoker: quit approx around [place].  80 pack-yr hx.   Alcohol-none, used to drink a lot of beer on weekends.  No drugs.   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   Diet: fair    Outpatient Medications Prior to Visit  Medication Sig Dispense Refill  . allopurinol (ZYLOPRIM) 300 MG tablet Take 300 mg by  mouth daily.    Marland Kitchen aspirin 81 MG tablet Take 81 mg by mouth daily.      . clopidogrel (PLAVIX) 75 MG tablet Take 1 tablet (75 mg total) by mouth daily. 90 tablet 3  . clotrimazole-betamethasone (LOTRISONE) cream Apply 1 application topically daily.  1  . desonide (DESOWEN) 0.05 % cream Apply 1 application topically daily. Reported on 02/27/2016  1  . diphenhydrAMINE (BENADRYL) 25 MG tablet Take 50 mg by mouth 3 (three) times daily as needed for allergies.     Marland Kitchen EPIPEN 2-PAK 0.3 MG/0.3ML SOAJ injection Use as directed  1  . ferrous sulfate 325 (65 FE) MG tablet Take 1 tablet by mouth 3 (three) times a week.  0  . fluticasone (FLONASE) 50 MCG/ACT nasal spray Place 1 spray into both nostrils daily.    Marland Kitchen gabapentin (NEURONTIN) 300 MG capsule Take 300 mg by mouth 4 (four) times daily.     Marland Kitchen loratadine (CLARITIN) 10 MG tablet Take 10 mg by mouth daily as needed for allergies. As needed    . metoprolol tartrate (LOPRESSOR) 25 MG tablet Take 1 tablet by mouth two  times daily 180 tablet 3  . nitroGLYCERIN (NITROSTAT) 0.4 MG SL tablet Place 1 tablet (0.4 mg total) under the tongue every 5 (five) minutes as needed for chest pain. 25 tablet 3  . pantoprazole (PROTONIX) 40 MG tablet Take 40 mg by mouth daily.    Marland Kitchen PREVIDENT 5000 BOOSTER PLUS 1.1 % PSTE Take by mouth as directed. APPLY TO TEETH AT BEDTIME  1  . rosuvastatin (CRESTOR) 20 MG tablet Take 1 tablet by mouth  daily 90 tablet 3  . sildenafil (VIAGRA) 100 MG tablet Take 100 mg by mouth daily as needed for erectile dysfunction.    . traMADol (ULTRAM) 50 MG tablet Take 1 tablet (50 mg total) by mouth 3 (three) times daily as needed. for pain 270 tablet 1  . valsartan (DIOVAN) 160 MG tablet Take 1 and 1/2 tablets by  mouth daily 135 tablet 3   No facility-administered medications prior to visit.     No Known Allergies  ROS Review of Systems  Constitutional: Negative for appetite change, chills, fatigue and fever.  HENT: Negative for congestion,  dental problem, ear pain and sore throat.   Eyes: Negative for discharge, redness and visual disturbance.  Respiratory: Negative for cough, chest tightness, shortness of  breath and wheezing.   Cardiovascular: Negative for chest pain, palpitations and leg swelling.  Gastrointestinal: Negative for abdominal pain, blood in stool, diarrhea, nausea and vomiting.  Genitourinary: Negative for difficulty urinating, dysuria, flank pain, frequency, hematuria and urgency.  Musculoskeletal: Negative for arthralgias, back pain, joint swelling, myalgias and neck stiffness.  Skin: Negative for pallor and rash.  Neurological: Negative for dizziness, speech difficulty, weakness and headaches.  Hematological: Negative for adenopathy. Does not bruise/bleed easily.  Psychiatric/Behavioral: Negative for confusion and sleep disturbance. The patient is not nervous/anxious.     PE; Blood pressure 138/82, pulse (!) 54, temperature 97.5 F (36.4 C), temperature source Oral, resp. rate 16, height 6\' 2"  (1.88 m), weight 212 lb (96.2 kg), SpO2 97 %. Gen: Alert, well appearing.  Patient is oriented to person, place, time, and situation. AFFECT: pleasant, lucid thought and speech. ENT: Ears: EACs clear, normal epithelium.  TMs with good light reflex and landmarks bilaterally.  Eyes: no injection, icteris, swelling, or exudate.  EOMI, PERRLA. Nose: no drainage or turbinate edema/swelling.  No injection or focal lesion.  Mouth: lips without lesion/swelling.  Oral mucosa pink and moist.  Dentition intact and without obvious caries or gingival swelling.  Oropharynx without erythema, exudate, or swelling.  Neck: supple/nontender.  No LAD, mass, or TM.  Carotid pulses 2+ bilaterally, without bruits. CV: RRR, no m/r/g.   LUNGS: CTA bilat, nonlabored resps, good aeration in all lung fields. ABD: soft, NT, ND, BS normal.  No hepatospenomegaly or mass.  No bruits. EXT: no clubbing, cyanosis, or edema.  Musculoskeletal: no joint  swelling, erythema, warmth, or tenderness.  ROM of all joints intact. Skin - no sores or suspicious lesions or rashes or color changes  Pertinent labs:  Lab Results  Component Value Date   TSH 1.29 03/22/2014   Lab Results  Component Value Date   WBC 8.1 08/18/2016   HGB 16.0 08/18/2016   HCT 46.3 08/18/2016   MCV 91.9 08/18/2016   PLT 186.0 08/18/2016   Lab Results  Component Value Date   CREATININE 1.07 03/06/2016   BUN 9 03/06/2016   NA 141 03/06/2016   K 3.9 03/06/2016   CL 104 03/06/2016   CO2 27 03/06/2016   Lab Results  Component Value Date   ALT 13 03/06/2016   AST 20 03/06/2016   ALKPHOS 46 03/06/2016   BILITOT 0.8 03/06/2016   Lab Results  Component Value Date   CHOL 139 03/06/2016   Lab Results  Component Value Date   HDL 54 03/06/2016   Lab Results  Component Value Date   LDLCALC 64 03/06/2016   Lab Results  Component Value Date   TRIG 105 03/06/2016   Lab Results  Component Value Date   CHOLHDL 2.6 03/06/2016   Lab Results  Component Value Date   PSA 2.13 03/22/2014   PSA 1.77 02/21/2013   PSA 1.90 02/25/2012   Lab Results  Component Value Date   HGBA1C 5.8 02/10/2016    ASSESSMENT AND PLAN:   Health maintenance exam: Reviewed age and gender appropriate health maintenance issues (prudent diet, regular exercise, health risks of tobacco and excessive alcohol, use of seatbelts, fire alarms in home, use of sunscreen).  Also reviewed age and gender appropriate health screening as well as vaccine recommendations. Vaccines are all UTD. Colon ca screening: last colonoscopy was 03/2016 and was done in the context of IDA workup: recall for another colonoscopy THIS YEAR per pt-?- for hx of tubular adenoma. He may simply mean  he was told to have GI f/u this year.  I cannot discern from EMR records when he was told to get his next colonoscopy.  Prostate ca screening: pt defers due to age and lack of any abnormal screenings in the past.   Fasting  lipids, CMET, and CBC drawn today, with Hba1c as well due to his of prediabetes.  An After Visit Summary was printed and given to the patient.  FOLLOW UP:  Return in about 6 months (around 04/19/2017) for routine chronic illness f/u.  Signed:  Crissie Sickles, MD           10/20/2016

## 2016-10-21 LAB — HEMOGLOBIN A1C
HEMOGLOBIN A1C: 5 % (ref <5.7)
MEAN PLASMA GLUCOSE: 97 mg/dL

## 2016-10-27 ENCOUNTER — Encounter: Payer: Self-pay | Admitting: Family Medicine

## 2016-11-03 DIAGNOSIS — J3089 Other allergic rhinitis: Secondary | ICD-10-CM | POA: Diagnosis not present

## 2016-11-03 DIAGNOSIS — J301 Allergic rhinitis due to pollen: Secondary | ICD-10-CM | POA: Diagnosis not present

## 2016-11-09 ENCOUNTER — Other Ambulatory Visit: Payer: Self-pay | Admitting: *Deleted

## 2016-11-09 MED ORDER — METOPROLOL TARTRATE 25 MG PO TABS: ORAL_TABLET | ORAL | 2 refills | Status: DC

## 2016-11-09 MED ORDER — VALSARTAN 160 MG PO TABS
ORAL_TABLET | ORAL | 2 refills | Status: DC
Start: 2016-11-09 — End: 2017-04-26

## 2016-11-10 DIAGNOSIS — J3089 Other allergic rhinitis: Secondary | ICD-10-CM | POA: Diagnosis not present

## 2016-11-10 DIAGNOSIS — J301 Allergic rhinitis due to pollen: Secondary | ICD-10-CM | POA: Diagnosis not present

## 2016-11-17 DIAGNOSIS — J3089 Other allergic rhinitis: Secondary | ICD-10-CM | POA: Diagnosis not present

## 2016-11-17 DIAGNOSIS — J301 Allergic rhinitis due to pollen: Secondary | ICD-10-CM | POA: Diagnosis not present

## 2016-11-17 MED ORDER — CLOPIDOGREL BISULFATE 75 MG PO TABS: 75.0000 mg | ORAL_TABLET | Freq: Every day | ORAL | 3 refills | Status: DC

## 2016-11-17 NOTE — Telephone Encounter (Signed)
Patient states he no longer wishes to switch medications. Confirmed with patient he is not having any problem with the Plavix. Rx called in. Patient was grateful for help.

## 2016-11-24 DIAGNOSIS — J3089 Other allergic rhinitis: Secondary | ICD-10-CM | POA: Diagnosis not present

## 2016-11-24 DIAGNOSIS — J301 Allergic rhinitis due to pollen: Secondary | ICD-10-CM | POA: Diagnosis not present

## 2016-11-26 ENCOUNTER — Telehealth: Payer: Self-pay | Admitting: Family Medicine

## 2016-11-26 ENCOUNTER — Other Ambulatory Visit: Payer: Self-pay | Admitting: Family Medicine

## 2016-11-26 MED ORDER — TRAMADOL HCL 50 MG PO TABS: 50.0000 mg | ORAL_TABLET | Freq: Three times a day (TID) | ORAL | 1 refills | Status: DC | PRN

## 2016-11-26 NOTE — Telephone Encounter (Signed)
Patient is requesting that we send a renewal of traMADol (ULTRAM) 50 MG tablet [id number] x90 day supply to [facility].

## 2016-11-26 NOTE — Telephone Encounter (Signed)
Tramadol rx printed.

## 2016-11-27 DIAGNOSIS — J3089 Other allergic rhinitis: Secondary | ICD-10-CM | POA: Diagnosis not present

## 2016-11-27 NOTE — Telephone Encounter (Signed)
Patient notified via message on voice mail. Rx faxed to pharmacy requested.

## 2016-12-03 DIAGNOSIS — J301 Allergic rhinitis due to pollen: Secondary | ICD-10-CM | POA: Diagnosis not present

## 2016-12-03 DIAGNOSIS — J3089 Other allergic rhinitis: Secondary | ICD-10-CM | POA: Diagnosis not present

## 2016-12-09 ENCOUNTER — Other Ambulatory Visit: Payer: Self-pay | Admitting: Dermatology

## 2016-12-09 DIAGNOSIS — L858 Other specified epidermal thickening: Secondary | ICD-10-CM | POA: Diagnosis not present

## 2016-12-09 DIAGNOSIS — C44622 Squamous cell carcinoma of skin of right upper limb, including shoulder: Secondary | ICD-10-CM | POA: Diagnosis not present

## 2016-12-09 DIAGNOSIS — J3089 Other allergic rhinitis: Secondary | ICD-10-CM | POA: Diagnosis not present

## 2016-12-09 DIAGNOSIS — J301 Allergic rhinitis due to pollen: Secondary | ICD-10-CM | POA: Diagnosis not present

## 2016-12-09 DIAGNOSIS — D492 Neoplasm of unspecified behavior of bone, soft tissue, and skin: Secondary | ICD-10-CM | POA: Diagnosis not present

## 2016-12-09 DIAGNOSIS — L570 Actinic keratosis: Secondary | ICD-10-CM | POA: Diagnosis not present

## 2016-12-09 DIAGNOSIS — D692 Other nonthrombocytopenic purpura: Secondary | ICD-10-CM | POA: Diagnosis not present

## 2016-12-10 ENCOUNTER — Telehealth: Payer: Self-pay | Admitting: Family Medicine

## 2016-12-10 MED ORDER — ROSUVASTATIN CALCIUM 20 MG PO TABS: ORAL_TABLET | ORAL | 2 refills | Status: DC

## 2016-12-10 MED ORDER — ROSUVASTATIN CALCIUM 20 MG PO TABS: ORAL_TABLET | ORAL | 0 refills | Status: DC

## 2016-12-10 NOTE — Telephone Encounter (Signed)
Jan 23rd shipment was not shipped. Patient needs a refill on rosuvastatin (CRESTOR) 20 MG tablet. Patient has 3 tablets left. Requesting a 30 day supply sent to: [facility] [place], [place] AT [address] [phone] (Phone) [phone] (Fax)   Requesting a 90 day sent to: [place], [place] [phone] (Phone) [phone] (Fax)   Please call patient to advise once rx has been filled.

## 2016-12-10 NOTE — Telephone Encounter (Signed)
Patient advised.

## 2016-12-10 NOTE — Telephone Encounter (Signed)
Crestor # 90 x 2 rfs sent to [facility].  Also sent in crestor # 30 no refill to [facility] per patient request.

## 2016-12-17 DIAGNOSIS — J3089 Other allergic rhinitis: Secondary | ICD-10-CM | POA: Diagnosis not present

## 2016-12-17 DIAGNOSIS — J301 Allergic rhinitis due to pollen: Secondary | ICD-10-CM | POA: Diagnosis not present

## 2016-12-21 ENCOUNTER — Other Ambulatory Visit (INDEPENDENT_AMBULATORY_CARE_PROVIDER_SITE_OTHER): Payer: Medicare HMO

## 2016-12-21 DIAGNOSIS — D509 Iron deficiency anemia, unspecified: Secondary | ICD-10-CM

## 2016-12-21 DIAGNOSIS — J3089 Other allergic rhinitis: Secondary | ICD-10-CM | POA: Diagnosis not present

## 2016-12-21 DIAGNOSIS — J301 Allergic rhinitis due to pollen: Secondary | ICD-10-CM | POA: Diagnosis not present

## 2016-12-21 LAB — CBC WITH DIFFERENTIAL/PLATELET
BASOS ABS: 0.1 10*3/uL (ref 0.0–0.1)
Basophils Relative: 0.9 % (ref 0.0–3.0)
EOS ABS: 0.2 10*3/uL (ref 0.0–0.7)
Eosinophils Relative: 2.7 % (ref 0.0–5.0)
HCT: 47.6 % (ref 39.0–52.0)
Hemoglobin: 16 g/dL (ref 13.0–17.0)
LYMPHS ABS: 2.6 10*3/uL (ref 0.7–4.0)
Lymphocytes Relative: 31 % (ref 12.0–46.0)
MCHC: 33.7 g/dL (ref 30.0–36.0)
MCV: 93.1 fl (ref 78.0–100.0)
MONOS PCT: 6.4 % (ref 3.0–12.0)
Monocytes Absolute: 0.5 10*3/uL (ref 0.1–1.0)
NEUTROS ABS: 4.9 10*3/uL (ref 1.4–7.7)
NEUTROS PCT: 59 % (ref 43.0–77.0)
PLATELETS: 196 10*3/uL (ref 150.0–400.0)
RBC: 5.12 Mil/uL (ref 4.22–5.81)
RDW: 14 % (ref 11.5–15.5)
WBC: 8.4 10*3/uL (ref 4.0–10.5)

## 2016-12-22 ENCOUNTER — Other Ambulatory Visit: Payer: Self-pay

## 2016-12-22 ENCOUNTER — Telehealth: Payer: Self-pay

## 2016-12-22 DIAGNOSIS — Z86010 Personal history of colonic polyps: Secondary | ICD-10-CM

## 2016-12-22 MED ORDER — NA SULFATE-K SULFATE-MG SULF 17.5-3.13-1.6 GM/177ML PO SOLN: 1.0000 | Freq: Once | ORAL | 0 refills | Status: AC

## 2016-12-22 NOTE — Telephone Encounter (Signed)
See previous note

## 2016-12-23 ENCOUNTER — Telehealth: Payer: Self-pay

## 2016-12-23 NOTE — Telephone Encounter (Signed)
Per Dr. Fransico Him pt may hold his Plavix 1 week prior to his colonoscopy. Pt informed.

## 2016-12-24 ENCOUNTER — Ambulatory Visit: Payer: Medicare HMO | Admitting: Podiatry

## 2016-12-30 DIAGNOSIS — J3089 Other allergic rhinitis: Secondary | ICD-10-CM | POA: Diagnosis not present

## 2016-12-30 DIAGNOSIS — J301 Allergic rhinitis due to pollen: Secondary | ICD-10-CM | POA: Diagnosis not present

## 2016-12-31 ENCOUNTER — Ambulatory Visit (INDEPENDENT_AMBULATORY_CARE_PROVIDER_SITE_OTHER): Payer: Medicare HMO | Admitting: Podiatry

## 2016-12-31 DIAGNOSIS — B351 Tinea unguium: Secondary | ICD-10-CM

## 2016-12-31 DIAGNOSIS — M79676 Pain in unspecified toe(s): Secondary | ICD-10-CM

## 2016-12-31 NOTE — Progress Notes (Signed)
Patient ID: Brandon Haynes, male   DOB: 1941/05/18, 76 y.o.   MRN: [id number] Complaint:  Visit Type: Patient returns to my office for continued preventative foot care services. Complaint: Patient states" my nails have grown long and thick and become painful to walk and wear shoes" . The patient presents for preventative foot care services. No changes to ROS.  He has been applying topical fungal medicine and he says his toes are better.  Podiatric Exam: Vascular: dorsalis pedis and posterior tibial pulses are palpable bilateral. Capillary return is immediate. Temperature gradient is WNL. Skin turgor WNL  Sensorium: Normal Semmes Weinstein monofilament test. Normal tactile sensation bilaterally. Nail Exam: Pt has thick disfigured discolored nails with subungual debris noted bilateral entire nail hallux through fifth toenails Ulcer Exam: There is no evidence of ulcer or pre-ulcerative changes or infection. Orthopedic Exam: Muscle tone and strength are WNL. No limitations in general ROM. No crepitus or effusions noted. Foot type and digits show no abnormalities. Bony prominences are unremarkable.Severe arthritis 1st MPJ B/L and hammer toe second right foot. Skin: No Porokeratosis. No infection or ulcers  Diagnosis:     Onychomycosis, , Pain in right toe, pain in left toes  Treatment & Plan Procedures and Treatment: Consent by patient was obtained for treatment procedures. The patient understood the discussion of treatment and procedures well. All questions were answered thoroughly reviewed. Debridement of mycotic and hypertrophic toenails, 1 through 5 bilateral and clearing of subungual debris. No ulceration, no infection noted.  Return Visit-Office Procedure: Patient instructed to return to the office for a follow up visit 3 months for continued evaluation and treatment.    Gardiner Barefoot DPM

## 2017-01-06 DIAGNOSIS — J301 Allergic rhinitis due to pollen: Secondary | ICD-10-CM | POA: Diagnosis not present

## 2017-01-06 DIAGNOSIS — J3089 Other allergic rhinitis: Secondary | ICD-10-CM | POA: Diagnosis not present

## 2017-01-13 DIAGNOSIS — J3089 Other allergic rhinitis: Secondary | ICD-10-CM | POA: Diagnosis not present

## 2017-01-13 DIAGNOSIS — J301 Allergic rhinitis due to pollen: Secondary | ICD-10-CM | POA: Diagnosis not present

## 2017-01-24 ENCOUNTER — Other Ambulatory Visit: Payer: Self-pay | Admitting: Family Medicine

## 2017-01-27 DIAGNOSIS — J3089 Other allergic rhinitis: Secondary | ICD-10-CM | POA: Diagnosis not present

## 2017-01-27 DIAGNOSIS — J301 Allergic rhinitis due to pollen: Secondary | ICD-10-CM | POA: Diagnosis not present

## 2017-02-05 DIAGNOSIS — J3089 Other allergic rhinitis: Secondary | ICD-10-CM | POA: Diagnosis not present

## 2017-02-05 DIAGNOSIS — J301 Allergic rhinitis due to pollen: Secondary | ICD-10-CM | POA: Diagnosis not present

## 2017-02-10 ENCOUNTER — Encounter: Payer: Self-pay | Admitting: Gastroenterology

## 2017-02-10 DIAGNOSIS — J301 Allergic rhinitis due to pollen: Secondary | ICD-10-CM | POA: Diagnosis not present

## 2017-02-10 DIAGNOSIS — J3089 Other allergic rhinitis: Secondary | ICD-10-CM | POA: Diagnosis not present

## 2017-02-15 DIAGNOSIS — J3089 Other allergic rhinitis: Secondary | ICD-10-CM | POA: Diagnosis not present

## 2017-02-15 DIAGNOSIS — J301 Allergic rhinitis due to pollen: Secondary | ICD-10-CM | POA: Diagnosis not present

## 2017-02-25 ENCOUNTER — Encounter: Payer: Self-pay | Admitting: Gastroenterology

## 2017-03-04 DIAGNOSIS — J3089 Other allergic rhinitis: Secondary | ICD-10-CM | POA: Diagnosis not present

## 2017-03-04 DIAGNOSIS — J301 Allergic rhinitis due to pollen: Secondary | ICD-10-CM | POA: Diagnosis not present

## 2017-03-05 DIAGNOSIS — M4726 Other spondylosis with radiculopathy, lumbar region: Secondary | ICD-10-CM | POA: Diagnosis not present

## 2017-03-05 DIAGNOSIS — M5136 Other intervertebral disc degeneration, lumbar region: Secondary | ICD-10-CM | POA: Diagnosis not present

## 2017-03-05 DIAGNOSIS — Z6826 Body mass index (BMI) 26.0-26.9, adult: Secondary | ICD-10-CM | POA: Diagnosis not present

## 2017-03-05 DIAGNOSIS — I10 Essential (primary) hypertension: Secondary | ICD-10-CM | POA: Diagnosis not present

## 2017-03-05 DIAGNOSIS — M48062 Spinal stenosis, lumbar region with neurogenic claudication: Secondary | ICD-10-CM | POA: Diagnosis not present

## 2017-03-05 DIAGNOSIS — M5416 Radiculopathy, lumbar region: Secondary | ICD-10-CM | POA: Diagnosis not present

## 2017-03-11 ENCOUNTER — Encounter: Payer: Medicare HMO | Admitting: Gastroenterology

## 2017-03-11 ENCOUNTER — Encounter: Payer: Self-pay | Admitting: Gastroenterology

## 2017-03-11 ENCOUNTER — Ambulatory Visit (AMBULATORY_SURGERY_CENTER): Payer: Medicare HMO | Admitting: Gastroenterology

## 2017-03-11 VITALS — BP 139/89 | HR 61 | Temp 98.0°F | Resp 14 | Ht 74.0 in | Wt 212.0 lb

## 2017-03-11 DIAGNOSIS — D122 Benign neoplasm of ascending colon: Secondary | ICD-10-CM

## 2017-03-11 DIAGNOSIS — Z86010 Personal history of colonic polyps: Secondary | ICD-10-CM | POA: Diagnosis present

## 2017-03-11 DIAGNOSIS — I10 Essential (primary) hypertension: Secondary | ICD-10-CM | POA: Diagnosis not present

## 2017-03-11 DIAGNOSIS — D120 Benign neoplasm of cecum: Secondary | ICD-10-CM | POA: Diagnosis not present

## 2017-03-11 DIAGNOSIS — I2510 Atherosclerotic heart disease of native coronary artery without angina pectoris: Secondary | ICD-10-CM | POA: Diagnosis not present

## 2017-03-11 DIAGNOSIS — D123 Benign neoplasm of transverse colon: Secondary | ICD-10-CM | POA: Diagnosis not present

## 2017-03-11 DIAGNOSIS — D125 Benign neoplasm of sigmoid colon: Secondary | ICD-10-CM | POA: Diagnosis not present

## 2017-03-11 MED ORDER — SODIUM CHLORIDE 0.9 % IV SOLN: 500.0000 mL | INTRAVENOUS | Status: DC

## 2017-03-11 NOTE — Progress Notes (Signed)
Called to room to assist during endoscopic procedure.  Patient ID and intended procedure confirmed with present staff. Received instructions for my participation in the procedure from the performing physician.  

## 2017-03-11 NOTE — Progress Notes (Signed)
Report given to PACU, vss 

## 2017-03-11 NOTE — Op Note (Signed)
[facility] Patient Name: Brandon Haynes Procedure Date: 03/11/2017 10:03 AM MRN: [id number] Endoscopist: Remo Lipps P.  MD, MD Age: 76 Referring MD:  Date of Birth: 15-Oct-1940 Gender: Male Account #: [id number] Procedure:                Colonoscopy Indications:              Surveillance: History of numerous (> 10) adenomas                            on last colonoscopy done 1 year ago Medicines:                Monitored Anesthesia Care Procedure:                Pre-Anesthesia Assessment:                           - Prior to the procedure, a History and Physical                            was performed, and patient medications and                            allergies were reviewed. The patient's tolerance of                            previous anesthesia was also reviewed. The risks                            and benefits of the procedure and the sedation                            options and risks were discussed with the patient.                            All questions were answered, and informed consent                            was obtained. Prior Anticoagulants: The patient has                            taken Plavix (clopidogrel), last dose was 5 days                            prior to procedure. ASA Grade Assessment: III - A                            patient with severe systemic disease. After                            reviewing the risks and benefits, the patient was                            deemed in satisfactory condition to undergo the  procedure.                           After obtaining informed consent, the colonoscope                            was passed under direct vision. Throughout the                            procedure, the patient's blood pressure, pulse, and                            oxygen saturations were monitored continuously. The                            Colonoscope was introduced through the anus and                         advanced to the the cecum, identified by                            appendiceal orifice and ileocecal valve. The                            colonoscopy was performed without difficulty. The                            patient tolerated the procedure well. The quality                            of the bowel preparation was good. The ileocecal                            valve, appendiceal orifice, and rectum were                            photographed. Scope In: 10:17:27 AM Scope Out: 10:40:22 AM Scope Withdrawal Time: 0 hours 19 minutes 56 seconds  Total Procedure Duration: 0 hours 22 minutes 55 seconds  Findings:                 The perianal and digital rectal examinations were                            normal.                           A 4 mm polyp was found in the cecum. The polyp was                            flat. The polyp was removed with a cold snare.                            Resection and retrieval were complete.  Two sessile polyps were found in the ascending                            colon. The polyps were 3 to 4 mm in size. These                            polyps were removed with a cold snare. Resection                            and retrieval were complete.                           Three sessile polyps were found in the transverse                            colon. The polyps were 3 to 4 mm in size. These                            polyps were removed with a cold snare. Resection                            and retrieval were complete.                           A 4 mm polyp was found in the sigmoid colon. The                            polyp was sessile. The polyp was removed with a                            cold snare. Resection and retrieval were complete.                           A few medium-mouthed diverticula were found in the                            left colon.                           Internal hemorrhoids were  found during retroflexion.                           The exam was otherwise without abnormality. Complications:            No immediate complications. Estimated blood loss:                            Minimal. Estimated Blood Loss:     Estimated blood loss was minimal. Impression:               - One 4 mm polyp in the cecum, removed with a cold                            snare. Resected and  retrieved.                           - Two 3 to 4 mm polyps in the ascending colon,                            removed with a cold snare. Resected and retrieved.                           - Three 3 to 4 mm polyps in the transverse colon,                            removed with a cold snare. Resected and retrieved.                           - One 4 mm polyp in the sigmoid colon, removed with                            a cold snare. Resected and retrieved.                           - Diverticulosis in the left colon.                           - Internal hemorrhoids.                           - The examination was otherwise normal. Recommendation:           - Patient has a contact number available for                            emergencies. The signs and symptoms of potential                            delayed complications were discussed with the                            patient. Return to normal activities tomorrow.                            Written discharge instructions were provided to the                            patient.                           - Resume previous diet.                           - Continue present medications.                           - Resume Plavix in 3 days                           -  Await pathology results.                           - Repeat colonoscopy is recommended for                            surveillance in 3 years regardless of pathology                            results given > 10 adenomas removed one year ago                           - No ibuprofen, naproxen,  or other non-steroidal                            anti-inflammatory drugs for 2 weeks after polyp                            removal. Remo Lipps P.  MD, MD 03/11/2017 10:45:12 AM This report has been signed electronically.

## 2017-03-11 NOTE — Patient Instructions (Signed)
YOU HAD AN ENDOSCOPIC PROCEDURE TODAY AT [facility]:   Refer to the procedure report that was given to you for any specific questions about what was found during the examination.  If the procedure report does not answer your questions, please call your gastroenterologist to clarify.  If you requested that your care partner not be given the details of your procedure findings, then the procedure report has been included in a sealed envelope for you to review at your convenience later.  YOU SHOULD EXPECT: Some feelings of bloating in the abdomen. Passage of more gas than usual.  Walking can help get rid of the air that was put into your GI tract during the procedure and reduce the bloating. If you had a lower endoscopy (such as a colonoscopy or flexible sigmoidoscopy) you may notice spotting of blood in your stool or on the toilet paper. If you underwent a bowel prep for your procedure, you may not have a normal bowel movement for a few days.  Please Note:  You might notice some irritation and congestion in your nose or some drainage.  This is from the oxygen used during your procedure.  There is no need for concern and it should clear up in a day or so.  SYMPTOMS TO REPORT IMMEDIATELY:   Following lower endoscopy (colonoscopy or flexible sigmoidoscopy):  Excessive amounts of blood in the stool  Significant tenderness or worsening of abdominal pains  Swelling of the abdomen that is new, acute  Fever of 100F or higher   For urgent or emergent issues, a gastroenterologist can be reached at any hour by calling [phone].   DIET:  We do recommend a small meal at first, but then you may proceed to your regular diet.  Drink plenty of fluids but you should avoid alcoholic beverages for 24 hours. Try to increase the fiber in your diet, and drink plenty of water.  ACTIVITY:  You should plan to take it easy for the rest of today and you should NOT DRIVE or use heavy machinery until  tomorrow (because of the sedation medicines used during the test).    FOLLOW UP: Our staff will call the number listed on your records the next business day following your procedure to check on you and address any questions or concerns that you may have regarding the information given to you following your procedure. If we do not reach you, we will leave a message.  However, if you are feeling well and you are not experiencing any problems, there is no need to return our call.  We will assume that you have returned to your regular daily activities without incident.  If any biopsies were taken you will be contacted by phone or by letter within the next 1-3 weeks.  Please call us at [phone] if you have not heard about the biopsies in 3 weeks.    SIGNATURES/CONFIDENTIALITY: You and/or your care partner have signed paperwork which will be entered into your electronic medical record.  These signatures attest to the fact that that the information above on your After Visit Summary has been reviewed and is understood.  Full responsibility of the confidentiality of this discharge information lies with you and/or your care-partner.  Stay off your plavix for 3 more days. No NSAIDS: ie aleve, aspirin, ibuprofen for two weeks per Dr. Havery Moros.

## 2017-03-11 NOTE — Progress Notes (Signed)
Pt. Reports no change in his surgical or medical history since pre-visit on 02/12/2017.

## 2017-03-12 ENCOUNTER — Telehealth: Payer: Self-pay | Admitting: *Deleted

## 2017-03-12 NOTE — Telephone Encounter (Signed)
  Follow up Call-  Call back number 03/11/2017 02/27/2016  Post procedure Call Back phone  # [phone] hm  Permission to leave phone message Yes Yes  Some recent data might be hidden     Patient questions:  Do you have a fever, pain , or abdominal swelling? No. Pain Score  0 *  Have you tolerated food without any problems? Yes.    Have you been able to return to your normal activities? Yes.    Do you have any questions about your discharge instructions: Diet   No. Medications  No. Follow up visit  No.  Do you have questions or concerns about your Care? No.  Actions: * If pain score is 4 or above: No action needed, pain <4.

## 2017-03-17 DIAGNOSIS — J3089 Other allergic rhinitis: Secondary | ICD-10-CM | POA: Diagnosis not present

## 2017-03-17 DIAGNOSIS — J301 Allergic rhinitis due to pollen: Secondary | ICD-10-CM | POA: Diagnosis not present

## 2017-03-18 ENCOUNTER — Encounter: Payer: Self-pay | Admitting: Family Medicine

## 2017-03-18 ENCOUNTER — Other Ambulatory Visit: Payer: Self-pay | Admitting: Neurosurgery

## 2017-03-18 ENCOUNTER — Encounter: Payer: Self-pay | Admitting: Gastroenterology

## 2017-03-18 DIAGNOSIS — M48062 Spinal stenosis, lumbar region with neurogenic claudication: Secondary | ICD-10-CM

## 2017-03-19 ENCOUNTER — Encounter: Payer: Self-pay | Admitting: Cardiology

## 2017-03-19 ENCOUNTER — Ambulatory Visit (INDEPENDENT_AMBULATORY_CARE_PROVIDER_SITE_OTHER): Payer: Medicare HMO | Admitting: Cardiology

## 2017-03-19 VITALS — BP 126/72 | HR 54 | Ht 74.0 in | Wt 212.4 lb

## 2017-03-19 DIAGNOSIS — T50905A Adverse effect of unspecified drugs, medicaments and biological substances, initial encounter: Secondary | ICD-10-CM

## 2017-03-19 DIAGNOSIS — R001 Bradycardia, unspecified: Secondary | ICD-10-CM | POA: Diagnosis not present

## 2017-03-19 DIAGNOSIS — I10 Essential (primary) hypertension: Secondary | ICD-10-CM

## 2017-03-19 DIAGNOSIS — E7800 Pure hypercholesterolemia, unspecified: Secondary | ICD-10-CM

## 2017-03-19 DIAGNOSIS — I2510 Atherosclerotic heart disease of native coronary artery without angina pectoris: Secondary | ICD-10-CM | POA: Diagnosis not present

## 2017-03-19 LAB — HEPATIC FUNCTION PANEL
ALT: 11 IU/L (ref 0–44)
AST: 16 IU/L (ref 0–40)
Albumin: 4.3 g/dL (ref 3.5–4.8)
Alkaline Phosphatase: 45 IU/L (ref 39–117)
Bilirubin Total: 0.7 mg/dL (ref 0.0–1.2)
Bilirubin, Direct: 0.21 mg/dL (ref 0.00–0.40)
TOTAL PROTEIN: 6 g/dL (ref 6.0–8.5)

## 2017-03-19 LAB — LIPID PANEL
CHOLESTEROL TOTAL: 105 mg/dL (ref 100–199)
Chol/HDL Ratio: 2.1 ratio (ref 0.0–5.0)
HDL: 50 mg/dL (ref >39)
LDL CALC: 37 mg/dL (ref 0–99)
TRIGLYCERIDES: 90 mg/dL (ref 0–149)
VLDL CHOLESTEROL CAL: 18 mg/dL (ref 5–40)

## 2017-03-19 LAB — BASIC METABOLIC PANEL
BUN / CREAT RATIO: 9 — AB (ref 10–24)
BUN: 8 mg/dL (ref 8–27)
CALCIUM: 8.9 mg/dL (ref 8.6–10.2)
CO2: 26 mmol/L (ref 20–29)
Chloride: 106 mmol/L (ref 96–106)
Creatinine, Ser: 0.93 mg/dL (ref 0.76–1.27)
GFR, EST AFRICAN AMERICAN: 93 mL/min/{1.73_m2} (ref >59)
GFR, EST NON AFRICAN AMERICAN: 81 mL/min/{1.73_m2} (ref >59)
Glucose: 88 mg/dL (ref 65–99)
POTASSIUM: 4.1 mmol/L (ref 3.5–5.2)
SODIUM: 146 mmol/L — AB (ref 134–144)

## 2017-03-19 NOTE — Patient Instructions (Signed)
Medication Instructions:  Your physician recommends that you continue on your current medications as directed. Please refer to the Current Medication list given to you today.   Labwork: Your physician recommends that you return for lab work in: TODAY (lipid/liver; bmet)   Testing/Procedures: none  Follow-Up: Your physician wants you to follow-up in: 6 months with Dr. Radford Pax. You will receive a reminder letter in the mail two months in advance. If you don't receive a letter, please call our office to schedule the follow-up appointment.   Any Other Special Instructions Will Be Listed Below (If Applicable).     If you need a refill on your cardiac medications before your next appointment, please call your pharmacy.

## 2017-03-19 NOTE — Progress Notes (Signed)
Cardiology Office Note    Date:  03/19/2017   ID:  Brandon, Haynes 1941-04-17, MRN [id number]  PCP:  Brandon Sou, MD  Cardiologist:  Brandon Him, MD   Chief Complaint  Patient presents with  . Coronary Artery Disease  . Hypertension  . Hyperlipidemia    History of Present Illness:  Brandon Haynes is a 76 y.o. 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 The RCA was felt well collateralized and he underwent PCI of the LAD. LV function was 55% at time of cath. Echocardiogram revealed akinesis of the basal-mid inferoseptal myocardium and mid-apical anteroseptal myocardium. His last nuclear stress test showed no ischemia. His angina has presented as CP and hypotension in the past.  He is here today for followup.  He denies anginal chest pain or pressure, SOB, DOE, dizziness, PND, orthopnea, lower extremity edema. Once in a while he will have a skipped heart beat.  He has chronic leg pain at night that is relieved with gabapentin, Tylenol and Tramadol but has no claudication symptoms. He is getting an MRI of his back soon.    Past Medical History:  Diagnosis Date  . Allergic rhinitis    allergy shots via Brandon Haynes  . Allergy   . Anxiety   . 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 CAD, multiple vessel    a. CP/near-syncope 11/2014 s/p DES to MLAD, occluded RCA after takeoff of RV marginal with collaterals treated medically, otherwise mild nonobstructive disease. EF 55%;  b. 07/2015 Lexiscan CL: EF 56%, no ischemia/infarct.  . Cancer ([place])    skin  . Chronic low back pain   . DDD (degenerative disc disease), lumbar   . GERD (gastroesophageal reflux disease)   . Gout   . Hearing loss   . History  of basal cell carcinoma   . History of adenomatous polyp of colon   . Hypercholesteremia   . Hypertension   . Iron deficiency anemia 2017   Colonoscopy, EGD, and capsule endoscopy w/out obvious cause/source found.  Oral iron helping as of 03/2016.   . Leg pain 03/06/2016  . 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 Peripheral vascular disease ([place])   . Pleural thickening    chronic  . Prediabetes    HbA1c 6 % 09/2015.    Past Surgical History:  Procedure Laterality Date  . back injection  oct 2011, 07/2010, 12/2012   no surgery  . Capsule endoscopy  02/2016   mild duodenitis, o/w NEG  . CARDIOVASCULAR STRESS TEST  05/2009; 07/2015  . carotid dopplers  11/2014   Bilateral - 1% to 9% ICA stenosis. Vertebral artery flow is  . COLONOSCOPY  08/2008; 02/2016; 02/2017   2017: 12 polyps (adenomatous).  02/2017 repeat TCS--multiple polyps: recall 3 yrs.  . CORONARY ANGIOPLASTY WITH STENT PLACEMENT  12/04/2014   4.0 x 12 mm Synergy stent to the mid LAD  . ESOPHAGOGASTRODUODENOSCOPY  02/2016   Gastritis.  NEG h pylori.    . LE arterial vascular study  02/2016   Per Dr. Radford Haynes, no evidence of peripheral vascular disease  . LEFT HEART CATHETERIZATION WITH CORONARY ANGIOGRAM N/A 12/04/2014   Procedure: LEFT HEART CATHETERIZATION WITH CORONARY ANGIOGRAM;  Surgeon: Brandon Artist, MD; LAD 99% then aneurysmal, OM1 40%, CFX 50%, RCA 40%, 50% then occluded  . ROTATOR CUFF REPAIR Left 09/2013   left  . SHOULDER INJECTION  feb 2014  . TRANSTHORACIC ECHOCARDIOGRAM  11/2014  EF 50-55%, some wall motion abnormalities noted, grade I diast dysfxn    Current Medications: Current Meds  Medication Sig  . allopurinol (ZYLOPRIM) 300 MG tablet Take 300 mg by mouth daily.  Marland Kitchen aspirin 81 MG tablet Take 81 mg by mouth daily.    . clopidogrel (PLAVIX) 75 MG tablet Take 1 tablet (75 mg total) by mouth daily.  . clotrimazole-betamethasone (LOTRISONE) cream Apply 1 application topically daily.  Marland Kitchen desonide (DESOWEN) 0.05 % cream Apply 1 application topically daily. Reported on 02/27/2016  . diphenhydrAMINE (BENADRYL) 25 MG tablet Take 50 mg by mouth 3 (three) times daily as needed for allergies.   . fluticasone (FLONASE) 50 MCG/ACT nasal spray Place 1 spray into  both nostrils daily.  Marland Kitchen gabapentin (NEURONTIN) 300 MG capsule Take 300 mg by mouth 4 (four) times daily.   Marland Kitchen loratadine (CLARITIN) 10 MG tablet Take 10 mg by mouth daily as needed for allergies. As needed  . metoprolol tartrate (LOPRESSOR) 25 MG tablet Take 1 tablet by mouth two  times daily  . nitroGLYCERIN (NITROSTAT) 0.4 MG SL tablet Place 1 tablet (0.4 mg total) under the tongue every 5 (five) minutes as needed for chest pain.  . pantoprazole (PROTONIX) 20 MG tablet Take 20 mg by mouth daily.  Marland Kitchen PREVIDENT 5000 BOOSTER PLUS 1.1 % PSTE Take by mouth as directed. APPLY TO TEETH AT BEDTIME  . rosuvastatin (CRESTOR) 20 MG tablet TAKE 1 TABLET BY MOUTH EVERY DAY  . sildenafil (VIAGRA) 100 MG tablet Take 100 mg by mouth daily as needed for erectile dysfunction.  . traMADol (ULTRAM) 50 MG tablet Take 1 tablet (50 mg total) by mouth 3 (three) times daily as needed. for pain  . valsartan (DIOVAN) 160 MG tablet Take 1 and 1/2 tablets by  mouth daily  . [DISCONTINUED] EPIPEN 2-PAK 0.3 MG/0.3ML SOAJ injection Use as directed   Current Facility-Administered Medications for the 03/19/17 encounter (Office Visit) with Brandon Margarita, MD  Medication  . 0.9 %  sodium chloride infusion    Allergies:   Patient has no known allergies.   Social History   Social History  . Marital status: Married    Spouse name: Brandon Haynes  . Number of children: 3  . Years of education: N/A   Occupational History  . retired    Social History Main Topics  . Smoking status: Former Smoker    Packs/day: 2.00    Years: 35.00    Types: Cigarettes    Quit date: 09/28/1998  . Smokeless tobacco: Former Systems developer    Types: Snuff    Quit date: 02/09/1986     Comment: as a teenager  . Alcohol use No     Comment: quit 1994  . Drug use: No  . Sexual activity: Not Asked   Other Topics Concern  . None   Social History Narrative   Married, 3 children.   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   Orig from  [place], lived [place] same farm all his life.   Former smoker: quit approx around [place].  80 pack-yr hx.   Alcohol-none, used to drink a lot of beer on weekends.  No drugs.   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   Diet: fair     Family History:  The patient's family history includes Breast cancer in his mother and sister; COPD in his brother; Congestive Heart Failure in his mother; Diabetes in his mother; Heart disease in his brother and father;  Lung cancer in his father.   ROS:   Please see the history of present illness.    Review of Systems  Musculoskeletal: Positive for back pain.   All other systems reviewed and are negative.  No flowsheet data found.     PHYSICAL EXAM:   VS:  BP 126/72   Pulse (!) 54   Ht 6\' 2"  (1.88 m)   Wt 212 lb 6.4 oz (96.3 kg)   SpO2 96%   BMI 27.27 kg/m    GEN: Well nourished, well developed, in no acute distress  HEENT: normal  Neck: no JVD, carotid bruits, or masses Cardiac: RRR; no murmurs, rubs, or gallops,no edema.  Intact distal pulses bilaterally.  Respiratory:  clear to auscultation bilaterally, normal work of breathing GI: soft, nontender, nondistended, + BS MS: no deformity or atrophy  Skin: warm and dry, no rash Neuro:  Alert and Oriented x 3, Strength and sensation are intact Psych: euthymic mood, full affect  Wt Readings from Last 3 Encounters:  03/19/17 212 lb 6.4 oz (96.3 kg)  03/11/17 212 lb (96.2 kg)  10/20/16 212 lb (96.2 kg)      Studies/Labs Reviewed:   EKG:  EKG is not ordered today.    Recent Labs: 10/20/2016: ALT 12; BUN 12; Creat 1.08; Potassium 4.7; Sodium 143 12/21/2016: Hemoglobin 16.0; Platelets 196.0   Lipid Panel    Component Value Date/Time   CHOL 129 10/20/2016 1017   TRIG 122 10/20/2016 1017   HDL 51 10/20/2016 1017   CHOLHDL 2.5 10/20/2016 1017   VLDL 24 10/20/2016 1017   LDLCALC 54 10/20/2016 1017    Additional studies/ records that were reviewed today include:   none    ASSESSMENT:    1. Coronary artery disease involving native coronary artery of native heart without angina pectoris   2. Essential hypertension   3. HYPERCHOLESTEROLEMIA   4. Bradycardia, drug induced      PLAN:  In order of problems listed above:  1. 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 The RCA was felt well collateralized. He underwent PCI of the LAD and nuclear stress test 2016 showed no ischemia.  He has not had any anginal CP.  He will continue ASA 81mg  daily, Plavix 75mg  daily, lopressor 25mg  BID and crestor 20mg  daily.    2. HTN - BP controlled on current meds. Continue valsartan 240mg  daily, Lopressor 25mg  BID.  3. Hyperlipidemia - LDL goal < 70. Continue crestor 20mg  daily.  LDL at goal at 51 in January.  I will check an LDL and ALT.  4. Drug induced bradycardia - he is asymptomatic with HR 54.    Medication Adjustments/Labs and Tests Ordered: Current medicines are reviewed at length with the patient today.  Concerns regarding medicines are outlined above.  Medication changes, Labs and Tests ordered today are listed in the Patient Instructions below.  There are no Patient Instructions on file for this visit.   Signed, Brandon Him, MD  03/19/2017 9:42 AM    [facility] [address], [place], [place]  [address] Phone: [phone]; Fax: [phone]

## 2017-03-22 ENCOUNTER — Inpatient Hospital Stay
Admission: RE | Admit: 2017-03-22 | Discharge: 2017-03-22 | Disposition: A | Discharge status: Home / Self Care | Payer: Medicare HMO | Source: Ambulatory Visit | Attending: Neurosurgery | Admitting: Neurosurgery

## 2017-03-24 DIAGNOSIS — J301 Allergic rhinitis due to pollen: Secondary | ICD-10-CM | POA: Diagnosis not present

## 2017-03-24 DIAGNOSIS — J3089 Other allergic rhinitis: Secondary | ICD-10-CM | POA: Diagnosis not present

## 2017-03-26 DIAGNOSIS — J301 Allergic rhinitis due to pollen: Secondary | ICD-10-CM | POA: Diagnosis not present

## 2017-04-02 ENCOUNTER — Encounter: Payer: Self-pay | Admitting: Podiatry

## 2017-04-02 ENCOUNTER — Ambulatory Visit (INDEPENDENT_AMBULATORY_CARE_PROVIDER_SITE_OTHER): Payer: Medicare HMO | Admitting: Podiatry

## 2017-04-02 DIAGNOSIS — B351 Tinea unguium: Secondary | ICD-10-CM | POA: Diagnosis not present

## 2017-04-02 DIAGNOSIS — J3089 Other allergic rhinitis: Secondary | ICD-10-CM | POA: Diagnosis not present

## 2017-04-02 DIAGNOSIS — M79676 Pain in unspecified toe(s): Secondary | ICD-10-CM

## 2017-04-02 DIAGNOSIS — J301 Allergic rhinitis due to pollen: Secondary | ICD-10-CM | POA: Diagnosis not present

## 2017-04-02 NOTE — Progress Notes (Signed)
Patient ID: Brandon Haynes, male   DOB: 1941-07-21, 77 y.o.   MRN: [id number] Complaint:  Visit Type: Patient returns to my office for continued preventative foot care services. Complaint: Patient states" my nails have grown long and thick and become painful to walk and wear shoes" . The patient presents for preventative foot care services. No changes to ROS.  He has been applying topical fungal medicine and he says his toes are better.  Podiatric Exam: Vascular: dorsalis pedis and posterior tibial pulses are palpable bilateral. Capillary return is immediate. Temperature gradient is WNL. Skin turgor WNL  Sensorium: Normal Semmes Weinstein monofilament test. Normal tactile sensation bilaterally. Nail Exam: Pt has thick disfigured discolored nails with subungual debris noted bilateral entire nail hallux through fifth toenails Ulcer Exam: There is no evidence of ulcer or pre-ulcerative changes or infection. Orthopedic Exam: Muscle tone and strength are WNL. No limitations in general ROM. No crepitus or effusions noted. Foot type and digits show no abnormalities. Bony prominences are unremarkable.Severe arthritis 1st MPJ B/L and hammer toe second right foot. Skin: No Porokeratosis. No infection or ulcers  Diagnosis:     Onychomycosis, , Pain in right toe, pain in left toes  Treatment & Plan Procedures and Treatment: Consent by patient was obtained for treatment procedures. The patient understood the discussion of treatment and procedures well. All questions were answered thoroughly reviewed. Debridement of mycotic and hypertrophic toenails, 1 through 5 bilateral and clearing of subungual debris. No ulceration, no infection noted.  Return Visit-Office Procedure: Patient instructed to return to the office for a follow up visit 3 months for continued evaluation and treatment.    Gardiner Barefoot DPM

## 2017-04-03 ENCOUNTER — Ambulatory Visit
Admission: RE | Admit: 2017-04-03 | Discharge: 2017-04-03 | Disposition: A | Discharge status: Home / Self Care | Payer: Medicare HMO | Source: Ambulatory Visit | Attending: Neurosurgery | Admitting: Neurosurgery

## 2017-04-03 DIAGNOSIS — M48062 Spinal stenosis, lumbar region with neurogenic claudication: Secondary | ICD-10-CM

## 2017-04-03 DIAGNOSIS — M5126 Other intervertebral disc displacement, lumbar region: Secondary | ICD-10-CM | POA: Diagnosis not present

## 2017-04-07 DIAGNOSIS — J301 Allergic rhinitis due to pollen: Secondary | ICD-10-CM | POA: Diagnosis not present

## 2017-04-07 DIAGNOSIS — J3089 Other allergic rhinitis: Secondary | ICD-10-CM | POA: Diagnosis not present

## 2017-04-09 DIAGNOSIS — J3089 Other allergic rhinitis: Secondary | ICD-10-CM | POA: Diagnosis not present

## 2017-04-14 DIAGNOSIS — M48062 Spinal stenosis, lumbar region with neurogenic claudication: Secondary | ICD-10-CM | POA: Diagnosis not present

## 2017-04-14 DIAGNOSIS — J301 Allergic rhinitis due to pollen: Secondary | ICD-10-CM | POA: Diagnosis not present

## 2017-04-14 DIAGNOSIS — J3089 Other allergic rhinitis: Secondary | ICD-10-CM | POA: Diagnosis not present

## 2017-04-14 DIAGNOSIS — M5136 Other intervertebral disc degeneration, lumbar region: Secondary | ICD-10-CM | POA: Diagnosis not present

## 2017-04-14 DIAGNOSIS — Z6826 Body mass index (BMI) 26.0-26.9, adult: Secondary | ICD-10-CM | POA: Diagnosis not present

## 2017-04-14 DIAGNOSIS — M5416 Radiculopathy, lumbar region: Secondary | ICD-10-CM | POA: Diagnosis not present

## 2017-04-14 DIAGNOSIS — M4726 Other spondylosis with radiculopathy, lumbar region: Secondary | ICD-10-CM | POA: Diagnosis not present

## 2017-04-20 ENCOUNTER — Ambulatory Visit: Payer: Medicare HMO | Admitting: Family Medicine

## 2017-04-21 ENCOUNTER — Ambulatory Visit (INDEPENDENT_AMBULATORY_CARE_PROVIDER_SITE_OTHER): Payer: Medicare HMO | Admitting: Family Medicine

## 2017-04-21 ENCOUNTER — Encounter: Payer: Self-pay | Admitting: Family Medicine

## 2017-04-21 VITALS — BP 107/68 | HR 49 | Temp 98.3°F | Resp 16 | Ht 74.0 in | Wt 209.2 lb

## 2017-04-21 DIAGNOSIS — E7800 Pure hypercholesterolemia, unspecified: Secondary | ICD-10-CM

## 2017-04-21 DIAGNOSIS — J301 Allergic rhinitis due to pollen: Secondary | ICD-10-CM | POA: Diagnosis not present

## 2017-04-21 DIAGNOSIS — I10 Essential (primary) hypertension: Secondary | ICD-10-CM

## 2017-04-21 DIAGNOSIS — R7303 Prediabetes: Secondary | ICD-10-CM | POA: Diagnosis not present

## 2017-04-21 DIAGNOSIS — J3089 Other allergic rhinitis: Secondary | ICD-10-CM | POA: Diagnosis not present

## 2017-04-21 DIAGNOSIS — Z862 Personal history of diseases of the blood and blood-forming organs and certain disorders involving the immune mechanism: Secondary | ICD-10-CM

## 2017-04-21 NOTE — Progress Notes (Signed)
OFFICE VISIT  04/21/2017   CC:  Chief Complaint  Patient presents with  . Follow-up    RCI, pt is fasting.     HPI:    Patient is a 76 y.o. Caucasian male who presents for 6 mo f/u HTN, HLD, and prediabetes. Doing well.  Works in [employer].  HTN: seldom monitors bp but when he checks it is <120/80. HLD and prediabetes: takes statin daily, no side effects.  Not eating a low chol/low carb diet.  Tries to limit red meat.   Eats fresh veggies/fruits.  Limiting fried foods.  Tries to limit sodium.  Had MRI of low back last week for chronic low back pain with R leg radiculopathy--showed a nerve compression but stable spondylosis.  He is going to get a ESI next month.  ROS: no CP, SOB, palpitations, dizziness, or LE swelling.  No HAs, no vision changes, no polyuria or polydipsia. No myalgias.  Past Medical History:  Diagnosis Date  . Allergic rhinitis    allergy shots via [place]  . Allergy   . Anxiety   . 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 CAD, multiple vessel    a. CP/near-syncope 11/2014 s/p DES to MLAD, occluded RCA after takeoff of RV marginal with collaterals treated medically, otherwise mild nonobstructive disease. EF 55%;  b. 07/2015 Lexiscan CL: EF 56%, no ischemia/infarct.  . Cancer ([place])    skin  . Chronic low back pain   . DDD (degenerative disc disease), lumbar   . GERD (gastroesophageal reflux disease)   . Gout   . Hearing loss   . History  of basal cell carcinoma   . History of adenomatous polyp of colon   . Hypercholesteremia   . Hypertension   . Iron deficiency anemia 2017   Colonoscopy, EGD, and capsule endoscopy w/out obvious cause/source found.  Oral iron helping as of 03/2016.  . Leg pain 03/06/2016  . 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 Peripheral vascular disease ([place])   . Pleural thickening    chronic  . Prediabetes    HbA1c 6 % 09/2015.    Past Surgical History:  Procedure Laterality Date  . back injection  oct 2011, 07/2010,  12/2012   no surgery  . Capsule endoscopy  02/2016   mild duodenitis, o/w NEG  . CARDIOVASCULAR STRESS TEST  05/2009; 07/2015  . carotid dopplers  11/2014   Bilateral - 1% to 9% ICA stenosis. Vertebral artery flow is  . COLONOSCOPY  08/2008; 02/2016; 02/2017   2017: 12 polyps (adenomatous).  02/2017 repeat TCS--multiple polyps: recall 3 yrs.  . CORONARY ANGIOPLASTY WITH STENT PLACEMENT  12/04/2014   4.0 x 12 mm Synergy stent to the mid LAD  . ESOPHAGOGASTRODUODENOSCOPY  02/2016   Gastritis.  NEG h pylori.    . LE arterial vascular study  02/2016   Per Dr. Radford Pax, no evidence of peripheral vascular disease  . LEFT HEART CATHETERIZATION WITH CORONARY ANGIOGRAM N/A 12/04/2014   Procedure: LEFT HEART CATHETERIZATION WITH CORONARY ANGIOGRAM;  Surgeon: Jolaine Artist, MD; LAD 99% then aneurysmal, OM1 40%, CFX 50%, RCA 40%, 50% then occluded  . ROTATOR CUFF REPAIR Left 09/2013   left  . SHOULDER INJECTION  feb 2014  . TRANSTHORACIC ECHOCARDIOGRAM  11/2014   EF 50-55%, some wall motion abnormalities noted, grade I diast dysfxn    Outpatient Medications Prior to Visit  Medication Sig Dispense Refill  . allopurinol (ZYLOPRIM) 300 MG tablet Take 300 mg by mouth daily.    Marland Kitchen  aspirin 81 MG tablet Take 81 mg by mouth daily.      . clopidogrel (PLAVIX) 75 MG tablet Take 1 tablet (75 mg total) by mouth daily. 90 tablet 3  . clotrimazole-betamethasone (LOTRISONE) cream Apply 1 application topically daily.  1  . desonide (DESOWEN) 0.05 % cream Apply 1 application topically daily. Reported on 02/27/2016  1  . diphenhydrAMINE (BENADRYL) 25 MG tablet Take 50 mg by mouth 3 (three) times daily as needed for allergies.     . fluticasone (FLONASE) 50 MCG/ACT nasal spray Place 1 spray into both nostrils daily.    Marland Kitchen gabapentin (NEURONTIN) 300 MG capsule Take 300 mg by mouth 4 (four) times daily.     Marland Kitchen loratadine (CLARITIN) 10 MG tablet Take 10 mg by mouth daily as needed for allergies. As needed    . metoprolol tartrate  (LOPRESSOR) 25 MG tablet Take 1 tablet by mouth two  times daily 180 tablet 2  . nitroGLYCERIN (NITROSTAT) 0.4 MG SL tablet Place 1 tablet (0.4 mg total) under the tongue every 5 (five) minutes as needed for chest pain. 25 tablet 3  . pantoprazole (PROTONIX) 20 MG tablet Take 20 mg by mouth daily.    Marland Kitchen PREVIDENT 5000 BOOSTER PLUS 1.1 % PSTE Take by mouth as directed. APPLY TO TEETH AT BEDTIME  1  . rosuvastatin (CRESTOR) 20 MG tablet TAKE 1 TABLET BY MOUTH EVERY DAY 90 tablet 0  . sildenafil (VIAGRA) 100 MG tablet Take 100 mg by mouth daily as needed for erectile dysfunction.    . traMADol (ULTRAM) 50 MG tablet Take 1 tablet (50 mg total) by mouth 3 (three) times daily as needed. for pain 270 tablet 1  . valsartan (DIOVAN) 160 MG tablet Take 1 and 1/2 tablets by  mouth daily 135 tablet 2  . 0.9 %  sodium chloride infusion      No facility-administered medications prior to visit.     No Known Allergies  ROS As per HPI  PE: Blood pressure 107/68, pulse (!) 49, temperature 98.3 F (36.8 C), temperature source Oral, resp. rate 16, height 6\' 2"  (1.88 m), weight 209 lb 4 oz (94.9 kg), SpO2 97 %. Gen: Alert, well appearing.  Patient is oriented to person, place, time, and situation. AFFECT: pleasant, lucid thought and speech. CV: RRR, no m/r/g.   LUNGS: CTA bilat, nonlabored resps, good aeration in all lung fields. EXT: no clubbing, cyanosis, or edema.   LABS:    Chemistry      Component Value Date/Time   NA 146 (H) 03/19/2017 1022   K 4.1 03/19/2017 1022   CL 106 03/19/2017 1022   CO2 26 03/19/2017 1022   BUN 8 03/19/2017 1022   CREATININE 0.93 03/19/2017 1022   CREATININE 1.08 10/20/2016 1017      Component Value Date/Time   CALCIUM 8.9 03/19/2017 1022   ALKPHOS 45 03/19/2017 1022   AST 16 03/19/2017 1022   ALT 11 03/19/2017 1022   BILITOT 0.7 03/19/2017 1022     Lab Results  Component Value Date   CHOL 105 03/19/2017   HDL 50 03/19/2017   LDLCALC 37 03/19/2017   TRIG  90 03/19/2017   CHOLHDL 2.1 03/19/2017   Lab Results  Component Value Date   TSH 1.29 03/22/2014   Lab Results  Component Value Date   WBC 8.4 12/21/2016   HGB 16.0 12/21/2016   HCT 47.6 12/21/2016   MCV 93.1 12/21/2016   PLT 196.0 12/21/2016   Lab Results  Component Value Date   IRON 201 (H) 08/18/2016   FERRITIN 74.6 08/18/2016   Lab Results  Component Value Date   HGBA1C 5.0 10/20/2016    IMPRESSION AND PLAN:  1) HTN; The current medical regimen is effective;  continue present plan and medications. Lytes/Cr via his cardiologist were great 02/2017.  2) HLD: tolerating statin.  FLP excellent 02/2017.  3) Prediabetes: he has been MUCH improved---A1c down to 5.0 at CPE 6 mo ago.   We'll recheck this at his CPE in 6 mo. Continue working on TLC.  4) Hx of IDA of possible GI origin (gastritis and duodenitis on w/u 2017. Most recent GI f/u he had normal CBC, iron studies normal as well.  He has been off iron now. Last colonoscopy 02/2017 had a couple of polyps--recall 3 years.  No labs indicated today.  An After Visit Summary was printed and given to the patient.  FOLLOW UP: Return in about 6 months (around 10/22/2017) for annual CPE (fasting).  Signed:  Crissie Sickles, MD           04/21/2017

## 2017-04-23 ENCOUNTER — Other Ambulatory Visit: Payer: Self-pay | Admitting: Gastroenterology

## 2017-04-23 DIAGNOSIS — J3089 Other allergic rhinitis: Secondary | ICD-10-CM | POA: Diagnosis not present

## 2017-04-23 DIAGNOSIS — J301 Allergic rhinitis due to pollen: Secondary | ICD-10-CM | POA: Diagnosis not present

## 2017-04-23 NOTE — Telephone Encounter (Signed)
Pt is requesting refill of Protonix. His last visit was in May of 2017. Looking at the last note you wanted him to consider Zantac. Just want to make sure you are ok to refill.

## 2017-04-23 NOTE — Telephone Encounter (Signed)
Yes we can refill it at 20mg  dose, he was decreased from prior dosing of 40mg . We can consider transitioning to Zantac moving forward, he can see me in routine clinic visit to discuss

## 2017-04-26 ENCOUNTER — Telehealth: Payer: Self-pay | Admitting: Pharmacist

## 2017-04-26 MED ORDER — IRBESARTAN 150 MG PO TABS: ORAL_TABLET | ORAL | 3 refills | Status: DC

## 2017-04-26 NOTE — Telephone Encounter (Signed)
Pt called clinic stating he was affected by valsartan recall. He currently takes valsartan 160mg  - 1.5 tablets daily. Will switch pt to equivalent dose of irbesartan 150mg  - take 1.5 tablets daily.

## 2017-04-28 ENCOUNTER — Encounter: Payer: Self-pay | Admitting: Family Medicine

## 2017-04-28 DIAGNOSIS — J3089 Other allergic rhinitis: Secondary | ICD-10-CM | POA: Diagnosis not present

## 2017-04-28 DIAGNOSIS — J301 Allergic rhinitis due to pollen: Secondary | ICD-10-CM | POA: Diagnosis not present

## 2017-04-29 MED ORDER — LOSARTAN POTASSIUM 50 MG PO TABS: 75.0000 mg | ORAL_TABLET | Freq: Every day | ORAL | 3 refills | Status: DC

## 2017-04-29 NOTE — Addendum Note (Signed)
Addended by: Tell Rozelle E on: 04/29/2017 02:50 PM   Modules accepted: Orders

## 2017-04-29 NOTE — Telephone Encounter (Signed)
Received notice from [employer] that pt's insurance will not cover more than 1 tablet of irbesartan per day. Will switch pt to losartan 75mg  daily instead. Pt is aware of change in med. Advised pt to monitor his BP over the next few weeks.

## 2017-04-30 DIAGNOSIS — J301 Allergic rhinitis due to pollen: Secondary | ICD-10-CM | POA: Diagnosis not present

## 2017-04-30 DIAGNOSIS — J3089 Other allergic rhinitis: Secondary | ICD-10-CM | POA: Diagnosis not present

## 2017-05-05 DIAGNOSIS — J301 Allergic rhinitis due to pollen: Secondary | ICD-10-CM | POA: Diagnosis not present

## 2017-05-05 DIAGNOSIS — J3089 Other allergic rhinitis: Secondary | ICD-10-CM | POA: Diagnosis not present

## 2017-05-10 DIAGNOSIS — J3089 Other allergic rhinitis: Secondary | ICD-10-CM | POA: Diagnosis not present

## 2017-05-10 DIAGNOSIS — J301 Allergic rhinitis due to pollen: Secondary | ICD-10-CM | POA: Diagnosis not present

## 2017-05-17 DIAGNOSIS — J3089 Other allergic rhinitis: Secondary | ICD-10-CM | POA: Diagnosis not present

## 2017-05-17 DIAGNOSIS — J301 Allergic rhinitis due to pollen: Secondary | ICD-10-CM | POA: Diagnosis not present

## 2017-05-20 DIAGNOSIS — M5136 Other intervertebral disc degeneration, lumbar region: Secondary | ICD-10-CM | POA: Diagnosis not present

## 2017-05-20 DIAGNOSIS — M4726 Other spondylosis with radiculopathy, lumbar region: Secondary | ICD-10-CM | POA: Diagnosis not present

## 2017-05-20 DIAGNOSIS — M48061 Spinal stenosis, lumbar region without neurogenic claudication: Secondary | ICD-10-CM | POA: Diagnosis not present

## 2017-06-02 DIAGNOSIS — J301 Allergic rhinitis due to pollen: Secondary | ICD-10-CM | POA: Diagnosis not present

## 2017-06-02 DIAGNOSIS — J3089 Other allergic rhinitis: Secondary | ICD-10-CM | POA: Diagnosis not present

## 2017-06-04 DIAGNOSIS — J3089 Other allergic rhinitis: Secondary | ICD-10-CM | POA: Diagnosis not present

## 2017-06-04 DIAGNOSIS — J301 Allergic rhinitis due to pollen: Secondary | ICD-10-CM | POA: Diagnosis not present

## 2017-06-04 DIAGNOSIS — R21 Rash and other nonspecific skin eruption: Secondary | ICD-10-CM | POA: Diagnosis not present

## 2017-06-04 DIAGNOSIS — K219 Gastro-esophageal reflux disease without esophagitis: Secondary | ICD-10-CM | POA: Diagnosis not present

## 2017-06-04 DIAGNOSIS — R69 Illness, unspecified: Secondary | ICD-10-CM | POA: Diagnosis not present

## 2017-06-15 DIAGNOSIS — J301 Allergic rhinitis due to pollen: Secondary | ICD-10-CM | POA: Diagnosis not present

## 2017-06-15 DIAGNOSIS — J3089 Other allergic rhinitis: Secondary | ICD-10-CM | POA: Diagnosis not present

## 2017-06-25 ENCOUNTER — Other Ambulatory Visit: Payer: Self-pay | Admitting: Family Medicine

## 2017-06-25 MED ORDER — TRAMADOL HCL 50 MG PO TABS: 50.0000 mg | ORAL_TABLET | Freq: Three times a day (TID) | ORAL | 0 refills | Status: DC | PRN

## 2017-06-25 NOTE — Telephone Encounter (Signed)
I can approve 90 day supply but no RF. I need to see him for office f/u of pain due to latest monitoring guidelines for pt's on prescription pain meds--need to see every 3 months in the office.  Needs to be seen next around 07/22/17.

## 2017-06-25 NOTE — Telephone Encounter (Signed)
RF request for tramadol LOV: 04/21/17 Next ov: 10/22/17 Last written: 11/26/16 #270 w/ 1RF  Please advise. Thanks.

## 2017-06-25 NOTE — Telephone Encounter (Signed)
Pt advised and voiced understanding. Pt will call back to schedule apt, needs to look at calendar.

## 2017-06-25 NOTE — Telephone Encounter (Signed)
Patient requesting 90 day of Tramadol sent to [place]

## 2017-06-29 DIAGNOSIS — J3089 Other allergic rhinitis: Secondary | ICD-10-CM | POA: Diagnosis not present

## 2017-06-29 DIAGNOSIS — J301 Allergic rhinitis due to pollen: Secondary | ICD-10-CM | POA: Diagnosis not present

## 2017-07-02 ENCOUNTER — Ambulatory Visit (INDEPENDENT_AMBULATORY_CARE_PROVIDER_SITE_OTHER): Payer: Medicare HMO | Admitting: Podiatry

## 2017-07-02 ENCOUNTER — Encounter: Payer: Self-pay | Admitting: Podiatry

## 2017-07-02 DIAGNOSIS — D689 Coagulation defect, unspecified: Secondary | ICD-10-CM

## 2017-07-02 DIAGNOSIS — M79676 Pain in unspecified toe(s): Secondary | ICD-10-CM

## 2017-07-02 DIAGNOSIS — B351 Tinea unguium: Secondary | ICD-10-CM

## 2017-07-02 NOTE — Progress Notes (Signed)
Patient ID: Brandon Haynes, male   DOB: December 11, 1940, 76 y.o.   MRN: [id number] Complaint:  Visit Type: Patient returns to my office for continued preventative foot care services. Complaint: Patient states" my nails have grown long and thick and become painful to walk and wear shoes" . The patient presents for preventative foot care services. No changes to ROS.  He has been applying topical fungal medicine and he says his toes are better.  Podiatric Exam: Vascular: dorsalis pedis and posterior tibial pulses are palpable bilateral. Capillary return is immediate. Temperature gradient is WNL. Skin turgor WNL  Sensorium: Normal Semmes Weinstein monofilament test. Normal tactile sensation bilaterally. Nail Exam: Pt has thick disfigured discolored nails with subungual debris noted bilateral entire nail hallux through fifth toenails Ulcer Exam: There is no evidence of ulcer or pre-ulcerative changes or infection. Orthopedic Exam: Muscle tone and strength are WNL. No limitations in general ROM. No crepitus or effusions noted. Foot type and digits show no abnormalities. Bony prominences are unremarkable.Severe arthritis 1st MPJ B/L and hammer toe second right foot. Skin: No Porokeratosis. No infection or ulcers      Onychomycosis, , Pain in right toe, pain in left toes  Treatment & Plan Procedures and Treatment: Consent by patient was obtained for treatment procedures. The patient understood the discussion of treatment and procedures well. All questions were answered thoroughly reviewed. Debridement of mycotic and hypertrophic toenails, 1 through 5 bilateral and clearing of subungual debris. No ulceration, no infection noted.  Return Visit-Office Procedure: Patient instructed to return to the office for a follow up visit 3 months for continued evaluation and treatment.    Gardiner Barefoot DPM

## 2017-07-05 ENCOUNTER — Telehealth: Payer: Self-pay | Admitting: Gastroenterology

## 2017-07-06 ENCOUNTER — Other Ambulatory Visit: Payer: Self-pay

## 2017-07-06 ENCOUNTER — Telehealth: Payer: Self-pay | Admitting: Gastroenterology

## 2017-07-06 MED ORDER — PANTOPRAZOLE SODIUM 20 MG PO TBEC: 20.0000 mg | DELAYED_RELEASE_TABLET | Freq: Every day | ORAL | 1 refills | Status: DC

## 2017-07-06 NOTE — Telephone Encounter (Signed)
Called pt and left message on home ans machine to call the office. His last OV was May 2017.  He needs to schedule an appt with Dr. Havery Moros and then we can fill his Rx of pantoprazole 20 mg to [facility] for 90 day supply.

## 2017-07-13 DIAGNOSIS — J301 Allergic rhinitis due to pollen: Secondary | ICD-10-CM | POA: Diagnosis not present

## 2017-07-13 DIAGNOSIS — J3089 Other allergic rhinitis: Secondary | ICD-10-CM | POA: Diagnosis not present

## 2017-07-22 ENCOUNTER — Encounter: Payer: Self-pay | Admitting: *Deleted

## 2017-07-22 ENCOUNTER — Encounter: Payer: Self-pay | Admitting: Family Medicine

## 2017-07-22 ENCOUNTER — Ambulatory Visit (INDEPENDENT_AMBULATORY_CARE_PROVIDER_SITE_OTHER): Payer: Medicare HMO | Admitting: Family Medicine

## 2017-07-22 VITALS — BP 91/55 | HR 56 | Temp 98.1°F | Resp 16 | Ht 74.0 in | Wt 212.5 lb

## 2017-07-22 DIAGNOSIS — I10 Essential (primary) hypertension: Secondary | ICD-10-CM | POA: Diagnosis not present

## 2017-07-22 DIAGNOSIS — Z79899 Other long term (current) drug therapy: Secondary | ICD-10-CM

## 2017-07-22 DIAGNOSIS — I952 Hypotension due to drugs: Secondary | ICD-10-CM | POA: Diagnosis not present

## 2017-07-22 DIAGNOSIS — G894 Chronic pain syndrome: Secondary | ICD-10-CM

## 2017-07-22 NOTE — Patient Instructions (Signed)
Decrease your losartan to 1 tab per day. If bp not 110 over 60 or greater OR if dizziness upon standing does not resolve with this dose change, decrease losartan to 1/2 tab daily.

## 2017-07-22 NOTE — Progress Notes (Signed)
OFFICE VISIT  07/22/2017   CC:  Chief Complaint  Patient presents with  . Follow-up    pain management   HPI:    Patient is a 76 y.o.  male who presents for chronic pain mgmt.  Indication for chronic opioid: chronic low back pain (DDD) and arthritis of multiple joints--feet, knees.  Opioids rx'd in order to maximize functioning and quality of life.  He has gotten periodic ESI but no hx of back surgery.  Medication and dose: tramadol 50mg , 1 tab tid prn. # pills per month: 90 Last UDS date: none Pain contract signed (Y/N):  N.  Will do today. Date narcotic database last reviewed (include red flags): today, no red flags.  Says pain control is certainly adequate on tramadol: takes anywhere from 0 to 3 tabs per day. No side effects from the tramadol.  Checks bp 1-2 times per week: normal usually , occ low 778'E systolic.  Orthostatic dizziness occurs daily, esp after sitting for an hour or so.  Walks 100 ft dizzy and then it resolves.  No falls.   He is working on getting a cardiology appt set up in Dec 2018.  Past Medical History:  Diagnosis Date  . Allergic rhinitis    allergy shots via [place]  . Allergy   . Anxiety   . 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 CAD, multiple vessel    a. CP/near-syncope 11/2014 s/p DES to MLAD, occluded RCA after takeoff of RV marginal with collaterals treated medically, otherwise mild nonobstructive disease. EF 55%;  b. 07/2015 Lexiscan CL: EF 56%, no ischemia/infarct.  Remains on DAPT as of 03/2017 cards f/u.  Marland Kitchen Cancer ([place])    skin  . Chronic low back pain   . DDD (degenerative disc disease), lumbar   . GERD (gastroesophageal reflux disease)   . Gout   . Hearing loss   . History  of basal cell carcinoma   . History of adenomatous polyp of colon   . Hypercholesteremia   . Hypertension   . Iron deficiency anemia 2017   Colonoscopy, EGD, and capsule endoscopy w/out obvious cause/source found.  Oral iron helping as of 03/2016.  . Leg pain  03/06/2016  . 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 Peripheral vascular disease ([place])   . Pleural thickening    chronic  . Prediabetes    HbA1c 6 % 09/2015.    Past Surgical History:  Procedure Laterality Date  . back injection  oct 2011, 07/2010, 12/2012   no surgery  . Capsule endoscopy  02/2016   mild duodenitis, o/w NEG  . CARDIOVASCULAR STRESS TEST  05/2009; 07/2015  . carotid dopplers  11/2014   Bilateral - 1% to 9% ICA stenosis. Vertebral artery flow is  . COLONOSCOPY  08/2008; 02/2016; 02/2017   2017: 12 polyps (adenomatous).  02/2017 repeat TCS--multiple polyps: recall 3 yrs.  . CORONARY ANGIOPLASTY WITH STENT PLACEMENT  12/04/2014   4.0 x 12 mm Synergy stent to the mid LAD  . ESOPHAGOGASTRODUODENOSCOPY  02/2016   Gastritis.  NEG h pylori.    . LE arterial vascular study  02/2016   Per Dr. Radford Pax, no evidence of peripheral vascular disease  . LEFT HEART CATHETERIZATION WITH CORONARY ANGIOGRAM N/A 12/04/2014   Procedure: LEFT HEART CATHETERIZATION WITH CORONARY ANGIOGRAM;  Surgeon: Jolaine Artist, MD; LAD 99% then aneurysmal, OM1 40%, CFX 50%, RCA 40%, 50% then occluded  . ROTATOR CUFF REPAIR Left 09/2013   left  . SHOULDER INJECTION  feb  2014  . TRANSTHORACIC ECHOCARDIOGRAM  11/2014   EF 50-55%, some wall motion abnormalities noted, grade I diast dysfxn    Outpatient Medications Prior to Visit  Medication Sig Dispense Refill  . allopurinol (ZYLOPRIM) 300 MG tablet Take 300 mg by mouth daily.    Marland Kitchen aspirin 81 MG tablet Take 81 mg by mouth daily.      . clopidogrel (PLAVIX) 75 MG tablet Take 1 tablet (75 mg total) by mouth daily. 90 tablet 3  . clotrimazole-betamethasone (LOTRISONE) cream Apply 1 application topically daily.  1  . desonide (DESOWEN) 0.05 % cream Apply 1 application topically daily. Reported on 02/27/2016  1  . diphenhydrAMINE (BENADRYL) 25 MG tablet Take 50 mg by mouth 3 (three) times daily as needed for allergies.     . fluticasone (FLONASE) 50 MCG/ACT  nasal spray Place 1 spray into both nostrils daily.    Marland Kitchen gabapentin (NEURONTIN) 300 MG capsule Take 300 mg by mouth 4 (four) times daily.     Marland Kitchen loratadine (CLARITIN) 10 MG tablet Take 10 mg by mouth daily as needed for allergies. As needed    . losartan (COZAAR) 50 MG tablet Take 1.5 tablets (75 mg total) by mouth daily. 135 tablet 3  . metoprolol tartrate (LOPRESSOR) 25 MG tablet Take 1 tablet by mouth two  times daily 180 tablet 2  . nitroGLYCERIN (NITROSTAT) 0.4 MG SL tablet Place 1 tablet (0.4 mg total) under the tongue every 5 (five) minutes as needed for chest pain. 25 tablet 3  . pantoprazole (PROTONIX) 20 MG tablet Take 1 tablet (20 mg total) by mouth daily. 90 tablet 1  . PREVIDENT 5000 BOOSTER PLUS 1.1 % PSTE Take by mouth as directed. APPLY TO TEETH AT BEDTIME  1  . rosuvastatin (CRESTOR) 20 MG tablet TAKE 1 TABLET BY MOUTH EVERY DAY 90 tablet 0  . sildenafil (VIAGRA) 100 MG tablet Take 100 mg by mouth daily as needed for erectile dysfunction.    . traMADol (ULTRAM) 50 MG tablet Take 1 tablet (50 mg total) by mouth 3 (three) times daily as needed. for pain 270 tablet 0   No facility-administered medications prior to visit.     No Known Allergies  ROS As per HPI  PE: Blood pressure (!) 91/55, pulse (!) 56, temperature 98.1 F (36.7 C), temperature source Oral, resp. rate 16, height 6\' 2"  (1.88 m), weight 212 lb 8 oz (96.4 kg), SpO2 96 %. Gen: Alert, well appearing.  Patient is oriented to person, place, time, and situation. AFFECT: pleasant, lucid thought and speech. No further exam today.  LABS:    Chemistry      Component Value Date/Time   NA 146 (H) 03/19/2017 1022   K 4.1 03/19/2017 1022   CL 106 03/19/2017 1022   CO2 26 03/19/2017 1022   BUN 8 03/19/2017 1022   CREATININE 0.93 03/19/2017 1022   CREATININE 1.08 10/20/2016 1017      Component Value Date/Time   CALCIUM 8.9 03/19/2017 1022   ALKPHOS 45 03/19/2017 1022   AST 16 03/19/2017 1022   ALT 11 03/19/2017  1022   BILITOT 0.7 03/19/2017 1022     Lab Results  Component Value Date   HGBA1C 5.0 10/20/2016    IMPRESSION AND PLAN:  1) Chronic pain syndrome: The current medical regimen is effective;  continue present plan and medications. No new rx needed today.  Most recent tramadol was taken yesterday. Urine tox today. CSC reviewed and signed today.  2) HTN:  bp low normal to low range lately, with orthostatic dizziness noted at least daily. Advised pt to cut back losartan to 1 tab per day, and if bp does not go up consistently above  An After Visit Summary was printed and given to the patient.  An After Visit Summary was printed and given to the patient.  FOLLOW UP: Return in about 4 months (around 11/22/2017) for annual CPE (fasting).  Signed:  Crissie Sickles, MD           07/22/2017

## 2017-07-26 DIAGNOSIS — J301 Allergic rhinitis due to pollen: Secondary | ICD-10-CM | POA: Diagnosis not present

## 2017-07-26 DIAGNOSIS — J3089 Other allergic rhinitis: Secondary | ICD-10-CM | POA: Diagnosis not present

## 2017-07-27 LAB — PAIN MGMT, PROFILE 8 W/CONF, U
6 Acetylmorphine: NEGATIVE ng/mL (ref <10)
ALCOHOL METABOLITES: NEGATIVE ng/mL (ref <500)
ALPHAHYDROXYALPRAZOLAM: NEGATIVE ng/mL (ref <25)
AMINOCLONAZEPAM: NEGATIVE ng/mL (ref <25)
Alphahydroxymidazolam: NEGATIVE ng/mL (ref <50)
Alphahydroxytriazolam: NEGATIVE ng/mL (ref <50)
Amphetamines: NEGATIVE ng/mL (ref <500)
BUPRENORPHINE, URINE: NEGATIVE ng/mL (ref <5)
Benzodiazepines: NEGATIVE ng/mL (ref <100)
COCAINE METABOLITE: NEGATIVE ng/mL (ref <150)
CREATININE: 143.6 mg/dL
Hydroxyethylflurazepam: NEGATIVE ng/mL (ref <50)
LORAZEPAM: NEGATIVE ng/mL (ref <50)
MARIJUANA METABOLITE: NEGATIVE ng/mL (ref <20)
MDMA: NEGATIVE ng/mL (ref <500)
Nordiazepam: NEGATIVE ng/mL (ref <50)
Opiates: NEGATIVE ng/mL (ref <100)
Oxazepam: NEGATIVE ng/mL (ref <50)
Oxidant: NEGATIVE ug/mL (ref <200)
Oxycodone: NEGATIVE ng/mL (ref <100)
TEMAZEPAM: NEGATIVE ng/mL (ref <50)
pH: 6.83 (ref 4.5–9.0)

## 2017-08-02 ENCOUNTER — Other Ambulatory Visit: Payer: Self-pay | Admitting: Family Medicine

## 2017-08-02 ENCOUNTER — Other Ambulatory Visit: Payer: Self-pay | Admitting: *Deleted

## 2017-08-02 MED ORDER — ALLOPURINOL 300 MG PO TABS: 300.0000 mg | ORAL_TABLET | Freq: Every day | ORAL | 0 refills | Status: DC

## 2017-08-02 MED ORDER — ALLOPURINOL 300 MG PO TABS: 300.0000 mg | ORAL_TABLET | Freq: Every day | ORAL | 1 refills | Status: DC

## 2017-08-02 NOTE — Telephone Encounter (Signed)
Rx sent as requested. Pt advised and voiced understanding.

## 2017-08-02 NOTE — Telephone Encounter (Signed)
Pt called stating that he is out of allopurinol. He is requesting Rx for 30 day supply be sent to [facility] and a 90 day supply be sent to his mail order. Please advise. Thanks.

## 2017-08-10 DIAGNOSIS — J3089 Other allergic rhinitis: Secondary | ICD-10-CM | POA: Diagnosis not present

## 2017-08-10 DIAGNOSIS — J301 Allergic rhinitis due to pollen: Secondary | ICD-10-CM | POA: Diagnosis not present

## 2017-08-13 DIAGNOSIS — M5416 Radiculopathy, lumbar region: Secondary | ICD-10-CM | POA: Diagnosis not present

## 2017-08-13 DIAGNOSIS — M4726 Other spondylosis with radiculopathy, lumbar region: Secondary | ICD-10-CM | POA: Diagnosis not present

## 2017-08-13 DIAGNOSIS — M5136 Other intervertebral disc degeneration, lumbar region: Secondary | ICD-10-CM | POA: Diagnosis not present

## 2017-08-13 DIAGNOSIS — M48062 Spinal stenosis, lumbar region with neurogenic claudication: Secondary | ICD-10-CM | POA: Diagnosis not present

## 2017-08-16 ENCOUNTER — Other Ambulatory Visit: Payer: Self-pay

## 2017-08-24 DIAGNOSIS — J3089 Other allergic rhinitis: Secondary | ICD-10-CM | POA: Diagnosis not present

## 2017-08-24 DIAGNOSIS — J3081 Allergic rhinitis due to animal (cat) (dog) hair and dander: Secondary | ICD-10-CM | POA: Diagnosis not present

## 2017-08-24 DIAGNOSIS — J301 Allergic rhinitis due to pollen: Secondary | ICD-10-CM | POA: Diagnosis not present

## 2017-08-27 ENCOUNTER — Encounter (HOSPITAL_COMMUNITY): Payer: Self-pay

## 2017-08-27 ENCOUNTER — Emergency Department (HOSPITAL_COMMUNITY)
Admission: EM | Admit: 2017-08-27 | Discharge: 2017-08-27 | Disposition: A | Discharge status: Home / Self Care | Payer: Medicare HMO | Attending: Emergency Medicine | Admitting: Emergency Medicine

## 2017-08-27 DIAGNOSIS — R040 Epistaxis: Secondary | ICD-10-CM | POA: Diagnosis not present

## 2017-08-27 DIAGNOSIS — Z7982 Long term (current) use of aspirin: Secondary | ICD-10-CM | POA: Insufficient documentation

## 2017-08-27 DIAGNOSIS — Z7902 Long term (current) use of antithrombotics/antiplatelets: Secondary | ICD-10-CM | POA: Diagnosis not present

## 2017-08-27 DIAGNOSIS — I2510 Atherosclerotic heart disease of native coronary artery without angina pectoris: Secondary | ICD-10-CM | POA: Diagnosis not present

## 2017-08-27 DIAGNOSIS — Z85828 Personal history of other malignant neoplasm of skin: Secondary | ICD-10-CM | POA: Diagnosis not present

## 2017-08-27 DIAGNOSIS — I10 Essential (primary) hypertension: Secondary | ICD-10-CM | POA: Diagnosis not present

## 2017-08-27 DIAGNOSIS — Z87891 Personal history of nicotine dependence: Secondary | ICD-10-CM | POA: Insufficient documentation

## 2017-08-27 DIAGNOSIS — R030 Elevated blood-pressure reading, without diagnosis of hypertension: Secondary | ICD-10-CM | POA: Diagnosis not present

## 2017-08-27 NOTE — ED Provider Notes (Signed)
[facility] EMERGENCY DEPARTMENT Provider Note   CSN: [id number] Arrival date & time: 08/27/17  1146     History   Chief Complaint Chief Complaint  Patient presents with  . Epistaxis    HPI Brandon Haynes is a 76 y.o. male.  HPI  76 year old male presents with epistaxis.  He is currently on aspirin and Plavix for coronary disease with a stent.  Patient has chronic nasal congestion and this morning he did saline sprays like typical.  After he blew his nose he noticed bleeding out of the right nostril.  Since then has had continuous bleeding.  He has chronic lightheadedness that has been going on for months but states he is not lightheaded currently and has not had increased lightheadedness today.  No chest pain or shortness of breath.  EMS noted the blood pressure to be 200/120 on arrival.  The patient has waxing and waning blood pressure and sometimes has had hypotension in the past, none today.  EMS sprayed multiple doses of Afrin in his right nose and currently the patient has no bleeding.  Patient denies any trauma to his nose.  Past Medical History:  Diagnosis Date  . Allergic rhinitis    allergy shots via [place]  . Allergy   . Anxiety   . 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 CAD, multiple vessel    a. CP/near-syncope 11/2014 s/p DES to MLAD, occluded RCA after takeoff of RV marginal with collaterals treated medically, otherwise mild nonobstructive disease. EF 55%;  b. 07/2015 Lexiscan CL: EF 56%, no ischemia/infarct.  Remains on DAPT as of 03/2017 cards f/u.  Marland Kitchen Cancer ([place])    skin  . Chronic low back pain   . DDD (degenerative disc disease), lumbar   . GERD (gastroesophageal reflux disease)   . Gout   . Hearing loss   . History  of basal cell carcinoma   . History of adenomatous polyp of colon   . Hypercholesteremia   . Hypertension   . Iron deficiency anemia 2017   Colonoscopy, EGD, and capsule endoscopy w/out obvious cause/source found.  Oral iron helping as of  03/2016.  . Leg pain 03/06/2016  . 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 Peripheral vascular disease ([place])   . Pleural thickening    chronic  . Prediabetes    HbA1c 6 % 09/2015.    Patient Active Problem List   Diagnosis Date Noted  . 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 Prediabetes 04/16/2016  . Leg pain 03/06/2016  . Dyspnea on exertion 08/23/2015  . Dizziness 08/23/2015  . CAD S/P percutaneous coronary angioplasty 08/23/2015  . Rash 08/23/2015  . Coronary artery disease involving native coronary artery of native heart without angina pectoris   . Syncope 12/03/2014  . 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 Symptomatic hypotension 03/22/2014  . Health maintenance examination 03/22/2014  . Encounter to establish care with new doctor 02/26/2014  . Rotator cuff tear 08/28/2013  . GERD 08/29/2010  . HEARING LOSS 06/21/2008  . PERIPHERAL VASCULAR DISEASE 06/21/2008  . ALLERGIC RHINITIS 06/21/2008  . BASAL CELL CARCINOMA, HX OF 06/21/2008  . COLONIC POLYPS 03/29/2008  . HYPERCHOLESTEROLEMIA 03/29/2008  . GOUT 03/29/2008  . ANXIETY 03/29/2008  . Essential hypertension 03/29/2008  . DEGENERATIVE DISC DISEASE 03/29/2008  . BACK PAIN, LUMBAR 03/29/2008    Past Surgical History:  Procedure Laterality Date  . back injection  oct 2011, 07/2010, 12/2012   no surgery  . Capsule endoscopy  02/2016  mild duodenitis, o/w NEG  . CARDIOVASCULAR STRESS TEST  05/2009; 07/2015  . carotid dopplers  11/2014   Bilateral - 1% to 9% ICA stenosis. Vertebral artery flow is  . COLONOSCOPY  08/2008; 02/2016; 02/2017   2017: 12 polyps (adenomatous).  02/2017 repeat TCS--multiple polyps: recall 3 yrs.  . CORONARY ANGIOPLASTY WITH STENT PLACEMENT  12/04/2014   4.0 x 12 mm Synergy stent to the mid LAD  . ESOPHAGOGASTRODUODENOSCOPY  02/2016   Gastritis.  NEG h pylori.    . LE arterial vascular study  02/2016   Per Dr. Radford Pax, no evidence of peripheral vascular disease  . LEFT HEART  CATHETERIZATION WITH CORONARY ANGIOGRAM N/A 12/04/2014   Procedure: LEFT HEART CATHETERIZATION WITH CORONARY ANGIOGRAM;  Surgeon: Jolaine Artist, MD; LAD 99% then aneurysmal, OM1 40%, CFX 50%, RCA 40%, 50% then occluded  . ROTATOR CUFF REPAIR Left 09/2013   left  . SHOULDER INJECTION  feb 2014  . TRANSTHORACIC ECHOCARDIOGRAM  11/2014   EF 50-55%, some wall motion abnormalities noted, grade I diast dysfxn       Home Medications    Prior to Admission medications   Medication Sig Start Date End Date Taking? Authorizing Provider  acetaminophen (TYLENOL) 500 MG tablet Take 500 mg by mouth every 6 (six) hours as needed for moderate pain.   Yes [provider]  allopurinol (ZYLOPRIM) 300 MG tablet Take 1 tablet (300 mg total) daily by mouth. Patient taking differently: Take 300 mg by mouth at bedtime.  08/02/17  Yes McGowen, [name], MD  aspirin 81 MG tablet Take 81 mg by mouth daily.     Yes [provider]  clopidogrel (PLAVIX) 75 MG tablet Take 1 tablet (75 mg total) by mouth daily. Patient taking differently: Take 75 mg by mouth at bedtime.  11/17/16  Yes Sueanne Margarita, MD  clotrimazole-betamethasone (LOTRISONE) cream Apply 1 application topically daily. 02/12/15  Yes [provider]  desonide (DESOWEN) 0.05 % cream Apply 1 application topically daily. Reported on 02/27/2016 02/11/15  Yes [provider]  diphenhydrAMINE (BENADRYL) 25 MG tablet Take 50 mg by mouth 3 (three) times daily as needed for itching or allergies.    Yes [provider]  fluticasone (FLONASE) 50 MCG/ACT nasal spray Place 1 spray into both nostrils daily.   Yes [provider]  gabapentin (NEURONTIN) 300 MG capsule Take 600 mg by mouth 2 (two) times daily.    Yes [provider]  loratadine (CLARITIN) 10 MG tablet Take 20 mg by mouth daily. As needed    Yes [provider]  losartan (COZAAR) 50 MG tablet Take 1.5 tablets (75 mg total) by mouth  daily. Patient taking differently: Take 50 mg by mouth daily.  04/29/17 08/27/17 Yes Turner, Eber Hong, MD  metoprolol tartrate (LOPRESSOR) 25 MG tablet Take 1 tablet by mouth two  times daily 11/09/16  Yes Turner, Eber Hong, MD  nitroGLYCERIN (NITROSTAT) 0.4 MG SL tablet Place 1 tablet (0.4 mg total) under the tongue every 5 (five) minutes as needed for chest pain. 09/14/16  Yes Turner, Eber Hong, MD  pantoprazole (PROTONIX) 20 MG tablet Take 1 tablet (20 mg total) by mouth daily. 07/06/17  Yes Armbruster, Carlota Raspberry, MD  PREVIDENT 5000 BOOSTER PLUS 1.1 % PSTE Take by mouth as directed. APPLY TO TEETH AT BEDTIME 10/27/14  Yes [provider]  rosuvastatin (CRESTOR) 20 MG tablet TAKE 1 TABLET BY MOUTH EVERY DAY 01/25/17  Yes McGowen, [name], MD  sildenafil (  VIAGRA) 100 MG tablet Take 100 mg by mouth daily as needed for erectile dysfunction.   Yes [provider]  traMADol (ULTRAM) 50 MG tablet Take 1 tablet (50 mg total) by mouth 3 (three) times daily as needed. for pain 06/25/17  Yes McGowen, [name], MD    Family History Family History  Problem Relation Age of Onset  . Lung cancer Father   . Heart disease Father   . Breast cancer Mother   . Congestive Heart Failure Mother   . Diabetes Mother   . Breast cancer Sister   . Heart disease Brother   . COPD Brother   . Colon cancer Neg Hx   . Esophageal cancer Neg Hx   . Pancreatic cancer Neg Hx   . Rectal cancer Neg Hx   . Stomach cancer Neg Hx   . Prostate cancer Neg Hx     Social History Social History   Tobacco Use  . Smoking status: Former Smoker    Packs/day: 2.00    Years: 35.00    Pack years: 70.00    Types: Cigarettes    Last attempt to quit: 09/28/1998    Years since quitting: 18.9  . Smokeless tobacco: Former Systems developer    Types: Snuff    Quit date: 02/09/1986  . Tobacco comment: as a teenager  Substance Use Topics  . Alcohol use: No    Alcohol/week: 0.0 oz    Comment: quit 1994  . Drug use: No     Allergies    Patient has no known allergies.   Review of Systems Review of Systems  HENT: Positive for congestion and nosebleeds. Negative for sore throat.   Respiratory: Negative for shortness of breath.   Cardiovascular: Negative for chest pain.  Gastrointestinal: Negative for vomiting.  Neurological: Negative for dizziness and light-headedness.  All other systems reviewed and are negative.    Physical Exam Updated Vital Signs BP (!) 154/91   Pulse (!) 59   Temp 97.7 F (36.5 C) (Oral)   Ht 6\' 2"  (1.88 m)   Wt 95.3 kg (210 lb)   SpO2 96%   BMI 26.96 kg/m   Physical Exam  Constitutional: He is oriented to person, place, and time. He appears well-developed and well-nourished. No distress.  HENT:  Head: Normocephalic and atraumatic.  Right Ear: External ear normal.  Left Ear: External ear normal.  Nose: Nose normal. No epistaxis.  Mouth/Throat: Oropharynx is clear and moist.  Bilateral nares are clear with no clot or obvious source of bleeding.  No obvious disturbance of Kiesselbach plexus  Eyes: Right eye exhibits no discharge. Left eye exhibits no discharge.  Neck: Neck supple.  Cardiovascular: Normal rate, regular rhythm and normal heart sounds.  Pulmonary/Chest: Effort normal and breath sounds normal.  Abdominal: Soft. There is no tenderness.  Musculoskeletal: He exhibits no edema.  Neurological: He is alert and oriented to person, place, and time.  Skin: Skin is warm and dry. He is not diaphoretic.  Nursing note and vitals reviewed.    ED Treatments / Results  Labs (all labs ordered are listed, but only abnormal results are displayed) Labs Reviewed - No data to display  EKG  EKG Interpretation None       Radiology No results found.  Procedures Procedures (including critical care time)  Medications Ordered in ED Medications - No data to display   Initial Impression / Assessment and Plan / ED Course  I have reviewed the triage vital signs and the  nursing  notes.  Pertinent labs & imaging results that were available during my care of the patient were reviewed by me and considered in my medical decision making (see chart for details).     Patient has had no further bleeding since being given Afrin by EMS.  He has had moderate hypertension but nowhere near the level he had with EMS.  Unclear if this is reactive or a causative factor.  He was observed in the ED for over 30 minutes and at this point is asking to be discharged.  Given no recurrence or obvious source, I think this is reasonable.  Discussed strategies to help stop nosebleeds should it recur.  He has no new or current dizziness/lightheadedness or any other concerning symptoms that would suggest significant blood loss.  Has seen Dr. Redmond Baseman in the past for his ears, will have him follow-up with him.  Return precautions.  Final Clinical Impressions(s) / ED Diagnoses   Final diagnoses:  Right-sided epistaxis    ED Discharge Orders    None       Sherwood Gambler, MD 08/27/17 1250

## 2017-08-27 NOTE — ED Triage Notes (Signed)
Pt has a nose bleed that started 2 hours ago. Per [facility], pt was given Afrin to stop bleeding. Has had 1 big clot come out and lots of bleeding. Was applied pressure and nose has slowed up bleeding.

## 2017-09-02 ENCOUNTER — Ambulatory Visit: Payer: Medicare HMO | Admitting: Gastroenterology

## 2017-09-02 ENCOUNTER — Other Ambulatory Visit (INDEPENDENT_AMBULATORY_CARE_PROVIDER_SITE_OTHER): Payer: Medicare HMO

## 2017-09-02 ENCOUNTER — Encounter: Payer: Self-pay | Admitting: Gastroenterology

## 2017-09-02 VITALS — BP 136/88 | HR 68 | Ht 74.0 in | Wt 213.4 lb

## 2017-09-02 DIAGNOSIS — D509 Iron deficiency anemia, unspecified: Secondary | ICD-10-CM | POA: Diagnosis not present

## 2017-09-02 DIAGNOSIS — K219 Gastro-esophageal reflux disease without esophagitis: Secondary | ICD-10-CM

## 2017-09-02 DIAGNOSIS — Z86010 Personal history of colonic polyps: Secondary | ICD-10-CM

## 2017-09-02 LAB — CBC WITH DIFFERENTIAL/PLATELET
BASOS PCT: 0.9 % (ref 0.0–3.0)
Basophils Absolute: 0.1 10*3/uL (ref 0.0–0.1)
EOS PCT: 2.3 % (ref 0.0–5.0)
Eosinophils Absolute: 0.2 10*3/uL (ref 0.0–0.7)
HEMATOCRIT: 43.4 % (ref 39.0–52.0)
HEMOGLOBIN: 14.9 g/dL (ref 13.0–17.0)
Lymphocytes Relative: 28.5 % (ref 12.0–46.0)
Lymphs Abs: 2.6 10*3/uL (ref 0.7–4.0)
MCHC: 34.3 g/dL (ref 30.0–36.0)
MCV: 95.3 fl (ref 78.0–100.0)
MONO ABS: 0.7 10*3/uL (ref 0.1–1.0)
Monocytes Relative: 7.6 % (ref 3.0–12.0)
NEUTROS PCT: 60.7 % (ref 43.0–77.0)
Neutro Abs: 5.6 10*3/uL (ref 1.4–7.7)
Platelets: 190 10*3/uL (ref 150.0–400.0)
RBC: 4.56 Mil/uL (ref 4.22–5.81)
RDW: 13.8 % (ref 11.5–15.5)
WBC: 9.2 10*3/uL (ref 4.0–10.5)

## 2017-09-02 MED ORDER — PANTOPRAZOLE SODIUM 40 MG PO TBEC: 40.0000 mg | DELAYED_RELEASE_TABLET | Freq: Every day | ORAL | 3 refills | Status: DC

## 2017-09-02 NOTE — Progress Notes (Signed)
HPI :  76 year old male here for follow-up visit.   Since his last visit he was noted to have an iron deficiency anemia. This led to EGD colonoscopy and eventually capsule endoscopy to further evaluate. Results of these test results outlined below. He did not have a clear etiology for his iron deficiency anemia noted. He was placed on oral iron supplementation and his iron stores have normalized and anemia resolved. He continues to take Plavix. He denies any NSAID use. He denies any blood in the stools. He denies any bowel habit changes. He denies any abdominal pain. He is generally feeling well without complaints today. He is off oral iron at this time.  Of note on colonoscopy he initially had 12 adenomas removed in 2017, and another few adenomas and sessile serrated polyps on his follow-up colonoscopy in June 2018. He denies any family history of colon cancer. He has not seen genetics yet.   He otherwise has a history of heartburn. His EGD showed no evidence of Barrett's esophagus. He is taking Protonix 20 mg once a day, despite this he is having recurrent heartburn and regurgitation that bothers him. No significant dysphagia.Marland Kitchen He takes Protonix twice daily at times for breakthrough and states this works well for him, he is asking for a higher dose at baseline.   Endoscopic history Colonoscopy 2009 - normal exam, "excellent prep" Colonoscopy 2004 - one diminutive adenoma Colonoscopy 02/27/2016 - 12 adenomatous polyps, diverticulosis, normal ileum EGD 02/27/2016 - normal esophagus, mild gastritis, gastric polyp - HP negative Capsule endoscopy - 03/18/2016 - small erosion, duodenitis, otherwise normal Colonoscopy 03/11/2017 - 7 small polyps, diverticulosis, adneomas / sessile serrated / hyperplastic - recall in 3 years  Past Medical History:  Diagnosis Date  . Allergic rhinitis    allergy shots via [place]  . Allergy   . Anxiety   . 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 CAD, multiple vessel     a. CP/near-syncope 11/2014 s/p DES to MLAD, occluded RCA after takeoff of RV marginal with collaterals treated medically, otherwise mild nonobstructive disease. EF 55%;  b. 07/2015 Lexiscan CL: EF 56%, no ischemia/infarct.  Remains on DAPT as of 03/2017 cards f/u.  Marland Kitchen Cancer ([place])    skin  . Chronic low back pain   . DDD (degenerative disc disease), lumbar   . GERD (gastroesophageal reflux disease)   . Gout   . Hearing loss   . History  of basal cell carcinoma   . History of adenomatous polyp of colon   . Hypercholesteremia   . Hypertension   . Iron deficiency anemia 2017   Colonoscopy, EGD, and capsule endoscopy w/out obvious cause/source found.  Oral iron helping as of 03/2016.  . Leg pain 03/06/2016  . 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 Peripheral vascular disease ([place])   . Pleural thickening    chronic  . Prediabetes    HbA1c 6 % 09/2015.     Past Surgical History:  Procedure Laterality Date  . back injection  oct 2011, 07/2010, 12/2012   no surgery  . Capsule endoscopy  02/2016   mild duodenitis, o/w NEG  . CARDIOVASCULAR STRESS TEST  05/2009; 07/2015  . carotid dopplers  11/2014   Bilateral - 1% to 9% ICA stenosis. Vertebral artery flow is  . COLONOSCOPY  08/2008; 02/2016; 02/2017   2017: 12 polyps (adenomatous).  02/2017 repeat TCS--multiple polyps: recall 3 yrs.  . CORONARY ANGIOPLASTY WITH STENT PLACEMENT  12/04/2014   4.0 x 12 mm Synergy  stent to the mid LAD  . ESOPHAGOGASTRODUODENOSCOPY  02/2016   Gastritis.  NEG h pylori.    . LE arterial vascular study  02/2016   Per Dr. Radford Pax, no evidence of peripheral vascular disease  . LEFT HEART CATHETERIZATION WITH CORONARY ANGIOGRAM N/A 12/04/2014   Procedure: LEFT HEART CATHETERIZATION WITH CORONARY ANGIOGRAM;  Surgeon: Jolaine Artist, MD; LAD 99% then aneurysmal, OM1 40%, CFX 50%, RCA 40%, 50% then occluded  . ROTATOR CUFF REPAIR Left 09/2013   left  . SHOULDER INJECTION  feb 2014  . TRANSTHORACIC ECHOCARDIOGRAM   11/2014   EF 50-55%, some wall motion abnormalities noted, grade I diast dysfxn   Family History  Problem Relation Age of Onset  . Lung cancer Father   . Heart disease Father   . Breast cancer Mother   . Congestive Heart Failure Mother   . Diabetes Mother   . Breast cancer Sister   . Heart disease Brother   . COPD Brother   . Colon cancer Neg Hx   . Esophageal cancer Neg Hx   . Pancreatic cancer Neg Hx   . Rectal cancer Neg Hx   . Stomach cancer Neg Hx   . Prostate cancer Neg Hx    Social History   Tobacco Use  . Smoking status: Former Smoker    Packs/day: 2.00    Years: 35.00    Pack years: 70.00    Types: Cigarettes    Last attempt to quit: 09/28/1998    Years since quitting: 18.9  . Smokeless tobacco: Former Systems developer    Types: Snuff    Quit date: 02/09/1986  . Tobacco comment: as a teenager  Substance Use Topics  . Alcohol use: No    Alcohol/week: 0.0 oz    Comment: quit 1994  . Drug use: No   Current Outpatient Medications  Medication Sig Dispense Refill  . acetaminophen (TYLENOL) 500 MG tablet Take 500 mg by mouth every 6 (six) hours as needed for moderate pain.    Marland Kitchen allopurinol (ZYLOPRIM) 300 MG tablet Take 1 tablet (300 mg total) daily by mouth. (Patient taking differently: Take 300 mg by mouth at bedtime. ) 90 tablet 1  . aspirin 81 MG tablet Take 81 mg by mouth daily.      . clopidogrel (PLAVIX) 75 MG tablet Take 1 tablet (75 mg total) by mouth daily. (Patient taking differently: Take 75 mg by mouth at bedtime. ) 90 tablet 3  . clotrimazole-betamethasone (LOTRISONE) cream Apply 1 application topically daily.  1  . desonide (DESOWEN) 0.05 % cream Apply 1 application topically daily. Reported on 02/27/2016  1  . diphenhydrAMINE (BENADRYL) 25 MG tablet Take 50 mg by mouth 3 (three) times daily as needed for itching or allergies.     . fluticasone (FLONASE) 50 MCG/ACT nasal spray Place 1 spray into both nostrils daily.    Marland Kitchen gabapentin (NEURONTIN) 300 MG capsule Take 600  mg by mouth 2 (two) times daily.     Marland Kitchen loratadine (CLARITIN) 10 MG tablet Take 20 mg by mouth daily. As needed     . metoprolol tartrate (LOPRESSOR) 25 MG tablet Take 1 tablet by mouth two  times daily 180 tablet 2  . nitroGLYCERIN (NITROSTAT) 0.4 MG SL tablet Place 1 tablet (0.4 mg total) under the tongue every 5 (five) minutes as needed for chest pain. 25 tablet 3  . pantoprazole (PROTONIX) 20 MG tablet Take 1 tablet (20 mg total) by mouth daily. 90 tablet 1  .  PREVIDENT 5000 BOOSTER PLUS 1.1 % PSTE Take by mouth as directed. APPLY TO TEETH AT BEDTIME  1  . rosuvastatin (CRESTOR) 20 MG tablet TAKE 1 TABLET BY MOUTH EVERY DAY 90 tablet 0  . sildenafil (VIAGRA) 100 MG tablet Take 100 mg by mouth daily as needed for erectile dysfunction.    . traMADol (ULTRAM) 50 MG tablet Take 1 tablet (50 mg total) by mouth 3 (three) times daily as needed. for pain 270 tablet 0  . losartan (COZAAR) 50 MG tablet Take 1.5 tablets (75 mg total) by mouth daily. (Patient taking differently: Take 50 mg by mouth daily. ) 135 tablet 3   No current facility-administered medications for this visit.    No Known Allergies   Review of Systems: All systems reviewed and negative except where noted in HPI.   Lab Results  Component Value Date   IRON 201 (H) 08/18/2016   FERRITIN 74.6 08/18/2016    CBC Latest Ref Rng & Units 09/02/2017 12/21/2016 10/20/2016  WBC 4.0 - 10.5 K/uL 9.2 8.4 7.6  Hemoglobin 13.0 - 17.0 g/dL 14.9 16.0 16.3  Hematocrit 39.0 - 52.0 % 43.4 47.6 47.5  Platelets 150.0 - 400.0 K/uL 190.0 196.0 167   Lab Results  Component Value Date   ALT 11 03/19/2017   AST 16 03/19/2017   ALKPHOS 45 03/19/2017   BILITOT 0.7 03/19/2017    Lab Results  Component Value Date   CREATININE 0.93 03/19/2017   BUN 8 03/19/2017   NA 146 (H) 03/19/2017   K 4.1 03/19/2017   CL 106 03/19/2017   CO2 26 03/19/2017     Physical Exam: BP 136/88   Pulse 68   Ht 6\' 2"  (1.88 m)   Wt 213 lb 6.4 oz (96.8 kg)   BMI  27.40 kg/m  Constitutional: Pleasant,well-developed, male in no acute distress. HEENT: Normocephalic and atraumatic. Conjunctivae are normal. No scleral icterus. Neck supple.  Cardiovascular: Normal rate, regular rhythm.  Pulmonary/chest: Effort normal and breath sounds normal. No wheezing, rales or rhonchi. Abdominal: Soft, nondistended, nontender.  There are no masses palpable. No hepatomegaly. Extremities: no edema Lymphadenopathy: No cervical adenopathy noted. Neurological: Alert and oriented to person place and time. Skin: Skin is warm and dry. No rashes noted. Psychiatric: Normal mood and affect. Behavior is normal.   ASSESSMENT AND PLAN: 76 year old male here for reassessment of the following issues:  Iron deficiency anemia - noted in 2017, workup with EGD, colonoscopy, capsule endoscopy failed to find a clear etiology. This was noted in the setting of aspirin and Plavix use. He responded well to oral iron supplementation with normalization of iron stores. He has since been off oral iron for the past several months. I repeat his CBC today and shows no anemia, hemoglobin stable at 14. Would recommend checking CBC yearly at this point, resume oral iron if needed in the future.  GERD - prior endoscopy without evidence of Barrett's. He is asking for higher dose of daily Protonix to control symptoms, will order 40 mg once a day. I have discussed risks and benefits of long-term PPI use with him, he understands and wishes to have higher dosing if this works better for him. Follow up as needed for this issue.  Histology of colon adenomas - around 15 adenomas over the past year. Due for recall colonoscopy 02/2020. I offered him genetic testing given he's had more than 10 adenomas. We discussed what this entailed and potential implications for additional screening tests for him and his  children. Following this discussion he declined. His kids have all had colonoscopies without any high risk polyps  or polyp burden, if he changes his mind he will let me know.   Carleton Cellar, MD [facility] Pager [phone]

## 2017-09-02 NOTE — Patient Instructions (Addendum)
If you are age 74 or older, your body mass index should be between 23-30. Your Body mass index is 27.4 kg/m. If this is out of the aforementioned range listed, please consider follow up with your Primary Care Provider.  If you are age 45 or younger, your body mass index should be between 19-25. Your Body mass index is 27.4 kg/m. If this is out of the aformentioned range listed, please consider follow up with your Primary Care Provider.   Your physician has requested that you go to the basement for the following lab work before leaving today: CBC  We have sent the following medications to your pharmacy for you to pick up at your convenience: Protonix 40mg  - take once daily  You will be due for a recall colonoscopy in 02-2019.Marland Kitchen We will send you a reminder in the mail when it gets closer to that time.  Thank you for entrusting me with your care, Dr. Woodbury Cellar

## 2017-09-08 DIAGNOSIS — J3089 Other allergic rhinitis: Secondary | ICD-10-CM | POA: Diagnosis not present

## 2017-09-08 DIAGNOSIS — J301 Allergic rhinitis due to pollen: Secondary | ICD-10-CM | POA: Diagnosis not present

## 2017-09-20 ENCOUNTER — Other Ambulatory Visit: Payer: Self-pay | Admitting: Cardiology

## 2017-09-20 MED ORDER — METOPROLOL TARTRATE 25 MG PO TABS: ORAL_TABLET | ORAL | 1 refills | Status: DC

## 2017-09-20 NOTE — Telephone Encounter (Signed)
Pt's medication was sent to pt's pharmacy as requested. Confirmation received.  °

## 2017-09-22 ENCOUNTER — Encounter: Payer: Self-pay | Admitting: Family Medicine

## 2017-09-22 DIAGNOSIS — J301 Allergic rhinitis due to pollen: Secondary | ICD-10-CM | POA: Diagnosis not present

## 2017-09-22 DIAGNOSIS — J3089 Other allergic rhinitis: Secondary | ICD-10-CM | POA: Diagnosis not present

## 2017-10-04 ENCOUNTER — Telehealth: Payer: Self-pay | Admitting: Cardiology

## 2017-10-04 DIAGNOSIS — J301 Allergic rhinitis due to pollen: Secondary | ICD-10-CM | POA: Diagnosis not present

## 2017-10-04 DIAGNOSIS — J3089 Other allergic rhinitis: Secondary | ICD-10-CM | POA: Diagnosis not present

## 2017-10-04 NOTE — Telephone Encounter (Signed)
Returned call to patient. Patient c/o elevated BP saturday night and early Sunday morning along with headache. Patient states Saturday morning BP 166/104, Saturday afternoon BP 124/74. This morning BP 178/107, 11 AM BP 124/75 HR 61, 2:15PM BP 154/96 HR 60.  Patient states he is taking losartan 75 mg once a day and metoprolol 25 mg BID.   Patient stated on 10/25 Dr. Ernestine Conrad decreased losartan to 50 mg once a day due to low BP's and dizziness. Patient was admitted to ER for right sided epistaxis on 08/27/17 and BP was 200/120. Patient was discharged with losartan 75 mg once a day and metoprolol 25 mg BID.  Patient denies, headache, blurred vision, dizziness, or lightheadedness. Advised patient to monitor sodium intake and if he has elevated BP along with symptoms of blurred vision, HA, weakness he should be evaluated immediately. Patient scheduled with Dr. Radford Pax on 10/06/17. Patient in agreement with plan, verbalized understanding and thanked me for the call.

## 2017-10-04 NOTE — Telephone Encounter (Signed)
New Message   Pt c/o BP issue:  1. What are your last 5 BP readings? 190/115 154/96  2. Are you having any other symptoms (ex. Dizziness, headache, blurred vision, passed out)? headaches 3. What is your medication issue? None  Patient has been experiencing nose bleeds as well.

## 2017-10-05 NOTE — Progress Notes (Signed)
Cardiology Office Note:    Date:  10/06/2017   ID:  Brandon Haynes, DOB 04-28-1941, MRN [id number]  PCP:  Tammi Sou, MD  Cardiologist:  Fransico Him, MD   Referring MD: Tammi Sou, MD   Chief Complaint  Patient presents with  . Coronary Artery Disease  . Hypertension  . Hyperlipidemia    History of Present Illness:    Brandon Haynes is a 77 y.o. male with a hx of with history of 2 vessel ASCAD with a 99% LAD and an occluded RCA. The RCA was felt well collateralized and he underwent PCI of the LAD. LV function was 55% at time of cath. Echocardiogram revealed akinesis of the basal-mid inferoseptal myocardium and mid-apical anteroseptal myocardium. His last nuclear stress test showed no ischemia. His angina has presented as CP and hypotension in the past.  Recently he has had some problems with epistaxis and HTN and was seen in the ER on 08/27/2017 with epistaxis and a BP of 200/145mmHg.  He was referred to ENT.  He also was found to have iron deficiency anemia and EGD and capsule endoscopy 02/2017 did not show any clear etiology.  He was placed on iron supp and his anemia has resolved.  Of note, OV followup with GI 09/02/2017 showed normal BP.   He is here today for followup and is doing well but he is still having some problems with elevated BP.  He brought in his BP readings today and they range from 116-192/70-53mmHg.  Most of the elevated BPs are early in the am.   He denies any anginal chest pain or pressure but has had some minor sharp shooting pains but they are very rare in occurrence and short lived.  No CP with exertion.  He denies any SOB, DOE, PND, orthopnea, LE edema, dizziness (except when BP is low), palpitations or syncope. He has had a few nosebleeds since the ER visit and did not followup with ENT. He is compliant with his meds and is tolerating meds with no SE.    Past Medical History:  Diagnosis Date  . Allergic rhinitis    allergy shots via [place]  .  Allergy   . Anxiety   . 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 CAD, multiple vessel    a. CP/near-syncope 11/2014 s/p DES to MLAD, occluded RCA after takeoff of RV marginal with collaterals treated medically, otherwise mild nonobstructive disease. EF 55%;  b. 07/2015 Lexiscan CL: EF 56%, no ischemia/infarct.  Remains on DAPT as of 03/2017 cards f/u.  Marland Kitchen Cancer ([place])    skin  . Chronic low back pain   . DDD (degenerative disc disease), lumbar   . GERD (gastroesophageal reflux disease)    PPI bid needed to control sx's  . Gout   . Hearing loss   . History  of basal cell carcinoma   . History of adenomatous polyp of colon   . Hypercholesteremia   . Hypertension   . Iron deficiency anemia 2017   Colonoscopy, EGD, and capsule endoscopy w/out obvious cause/source found.  Oral iron helping as of 03/2016.  . Leg pain 03/06/2016  . 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 Peripheral vascular disease ([place])   . Pleural thickening    chronic  . Prediabetes    HbA1c 6 % 09/2015.    Past Surgical History:  Procedure Laterality Date  . back injection  oct 2011, 07/2010, 12/2012   no surgery  . Capsule endoscopy  02/2016  mild duodenitis, o/w NEG  . CARDIOVASCULAR STRESS TEST  05/2009; 07/2015  . carotid dopplers  11/2014   Bilateral - 1% to 9% ICA stenosis. Vertebral artery flow is  . COLONOSCOPY  08/2008; 02/2016; 02/2017   2017: 12 polyps (adenomatous).  02/2017 repeat TCS--multiple polyps: recall 3 yrs.  . CORONARY ANGIOPLASTY WITH STENT PLACEMENT  12/04/2014   4.0 x 12 mm Synergy stent to the mid LAD  . ESOPHAGOGASTRODUODENOSCOPY  02/2016   Gastritis.  NEG h pylori.    . LE arterial vascular study  02/2016   Per Dr. Radford Pax, no evidence of peripheral vascular disease  . LEFT HEART CATHETERIZATION WITH CORONARY ANGIOGRAM N/A 12/04/2014   Procedure: LEFT HEART CATHETERIZATION WITH CORONARY ANGIOGRAM;  Surgeon: Jolaine Artist, MD; LAD 99% then aneurysmal, OM1 40%, CFX 50%, RCA 40%, 50% then  occluded  . ROTATOR CUFF REPAIR Left 09/2013   left  . SHOULDER INJECTION  feb 2014  . TRANSTHORACIC ECHOCARDIOGRAM  11/2014   EF 50-55%, some wall motion abnormalities noted, grade I diast dysfxn    Current Medications: Current Meds  Medication Sig  . acetaminophen (TYLENOL) 500 MG tablet Take 500 mg by mouth every 6 (six) hours as needed for moderate pain.  Marland Kitchen allopurinol (ZYLOPRIM) 300 MG tablet Take 1 tablet (300 mg total) daily by mouth.  Marland Kitchen aspirin 81 MG tablet Take 81 mg by mouth daily.    . clopidogrel (PLAVIX) 75 MG tablet Take 1 tablet (75 mg total) by mouth daily.  . clotrimazole-betamethasone (LOTRISONE) cream Apply 1 application topically daily.  Marland Kitchen desonide (DESOWEN) 0.05 % cream Apply 1 application topically daily. Reported on 02/27/2016  . diphenhydrAMINE (BENADRYL) 25 MG tablet Take 50 mg by mouth 3 (three) times daily as needed for itching or allergies.   . fluticasone (FLONASE) 50 MCG/ACT nasal spray Place 1 spray into both nostrils daily.  Marland Kitchen gabapentin (NEURONTIN) 300 MG capsule Take 600 mg by mouth 2 (two) times daily.   Marland Kitchen loratadine (CLARITIN) 10 MG tablet Take 20 mg by mouth daily. As needed for allergy  . metoprolol tartrate (LOPRESSOR) 25 MG tablet Take 1 tablet by mouth two  times daily  . nitroGLYCERIN (NITROSTAT) 0.4 MG SL tablet Place 1 tablet (0.4 mg total) under the tongue every 5 (five) minutes as needed for chest pain.  . pantoprazole (PROTONIX) 40 MG tablet Take 1 tablet (40 mg total) by mouth daily.  Marland Kitchen PREVIDENT 5000 BOOSTER PLUS 1.1 % PSTE Take by mouth as directed. APPLY TO TEETH AT BEDTIME  . rosuvastatin (CRESTOR) 20 MG tablet TAKE 1 TABLET BY MOUTH EVERY DAY  . sildenafil (VIAGRA) 100 MG tablet Take 100 mg by mouth daily as needed for erectile dysfunction.  . traMADol (ULTRAM) 50 MG tablet Take 1 tablet (50 mg total) by mouth 3 (three) times daily as needed. for pain     Allergies:   Patient has no known allergies.   Social History   Socioeconomic  History  . Marital status: Married    Spouse name: Vaughan Basta  . Number of children: 3  . Years of education: None  . Highest education level: None  Social Needs  . Financial resource strain: None  . Food insecurity - worry: None  . Food insecurity - inability: None  . Transportation needs - medical: None  . Transportation needs - non-medical: None  Occupational History  . Occupation: retired  Tobacco Use  . Smoking status: Former Smoker    Packs/day: 2.00    Years:  35.00    Pack years: 70.00    Types: Cigarettes    Last attempt to quit: 09/28/1998    Years since quitting: 19.0  . Smokeless tobacco: Former Systems developer    Types: Snuff    Quit date: 02/09/1986  . Tobacco comment: as a teenager  Substance and Sexual Activity  . Alcohol use: No    Alcohol/week: 0.0 oz    Comment: quit 1994  . Drug use: No  . Sexual activity: None  Other Topics Concern  . None  Social History Narrative   Married, 3 children.   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   Orig from [place], lived [place] same farm all his life.   Former smoker: quit approx around [place].  80 pack-yr hx.   Alcohol-none, used to drink a lot of beer on weekends.  No drugs.   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   Diet: fair     Family History: The patient's family history includes Breast cancer in his mother and sister; COPD in his brother; Congestive Heart Failure in his mother; Diabetes in his mother; Heart disease in his brother and father; Lung cancer in his father. There is no history of Colon cancer, Esophageal cancer, Pancreatic cancer, Rectal cancer, Stomach cancer, or Prostate cancer.  ROS:   Please see the history of present illness.    ROS  All other systems reviewed and negative.   EKGs/Labs/Other Studies Reviewed:    The following studies were reviewed today: none  EKG:  EKG is ordered today showing NSR at 62bpm  Recent Labs: 03/19/2017: ALT 11; BUN 8; Creatinine, Ser 0.93;  Potassium 4.1; Sodium 146 09/02/2017: Hemoglobin 14.9; Platelets 190.0   Recent Lipid Panel    Component Value Date/Time   CHOL 105 03/19/2017 1022   TRIG 90 03/19/2017 1022   HDL 50 03/19/2017 1022   CHOLHDL 2.1 03/19/2017 1022   CHOLHDL 2.5 10/20/2016 1017   VLDL 24 10/20/2016 1017   LDLCALC 37 03/19/2017 1022    Physical Exam:    VS:  BP 122/88   Pulse 62   Ht 6\' 2"  (1.88 m)   Wt 213 lb 12.8 oz (97 kg)   BMI 27.45 kg/m     Wt Readings from Last 3 Encounters:  10/06/17 213 lb 12.8 oz (97 kg)  09/02/17 213 lb 6.4 oz (96.8 kg)  08/27/17 210 lb (95.3 kg)     GEN:  Well nourished, well developed in no acute distress HEENT: Normal NECK: No JVD; No carotid bruits LYMPHATICS: No lymphadenopathy CARDIAC: RRR, no murmurs, rubs, gallops RESPIRATORY:  Clear to auscultation without rales, wheezing or rhonchi  ABDOMEN: Soft, non-tender, non-distended MUSCULOSKELETAL:  No edema; No deformity  SKIN: Warm and dry NEUROLOGIC:  Alert and oriented x 3 PSYCHIATRIC:  Normal affect   ASSESSMENT:    1. Coronary artery disease involving native coronary artery of native heart without angina pectoris   2. Essential hypertension   3. HYPERCHOLESTEROLEMIA    PLAN:    In order of problems listed above:  1.  ASCAD -  2 vessel ASCAD with a 99% LAD and an occluded RCA after takeoff of large RB marginal with good collaterals by cath 11/2014 s/p DES PCI to the mid LAD. Lexiscan myoview 07/2015 with no ischemia.  He denies any anginal symptoms.  As he has been having problems with epistaxis and PCI was over 2 years ago, I have instructed him to stop Plavix and just continue  on ASA 81mg  daily as well as statin.  2.  HTN - he presented with hypertensive urgency to the ER in November in the setting of epistaxis and was felt that his BP could have been related to the stress of the event.  His BP at GI last month was normal.  His BP today is controlled but has continued to run high in the am quite  freqently.  He will continue on Lopressor 25mg  BID and I will change his Losartan to 50mg  qam and 25mg  qpm.  I will have him continue to keep a log of his BPs and followup in HTN clinic in 1 week.    We discussed following a strict low sodium diet as well which he already does.   3.  Hyperlipidemia with LDL goal < 70.  He will continue on Crestor 20mg  daily.  His LDL was at goal at 37 on labs 02/2017.   Medication Adjustments/Labs and Tests Ordered: Current medicines are reviewed at length with the patient today.  Concerns regarding medicines are outlined above.  No orders of the defined types were placed in this encounter.  No orders of the defined types were placed in this encounter.   Signed, Fransico Him, MD  10/06/2017 8:19 AM    [place]

## 2017-10-06 ENCOUNTER — Ambulatory Visit: Payer: Medicare HMO | Admitting: Cardiology

## 2017-10-06 ENCOUNTER — Encounter: Payer: Self-pay | Admitting: Cardiology

## 2017-10-06 VITALS — BP 122/88 | HR 62 | Ht 74.0 in | Wt 213.8 lb

## 2017-10-06 DIAGNOSIS — I2510 Atherosclerotic heart disease of native coronary artery without angina pectoris: Secondary | ICD-10-CM | POA: Diagnosis not present

## 2017-10-06 DIAGNOSIS — E7800 Pure hypercholesterolemia, unspecified: Secondary | ICD-10-CM

## 2017-10-06 DIAGNOSIS — R040 Epistaxis: Secondary | ICD-10-CM

## 2017-10-06 DIAGNOSIS — I10 Essential (primary) hypertension: Secondary | ICD-10-CM

## 2017-10-06 LAB — BASIC METABOLIC PANEL
BUN / CREAT RATIO: 10 (ref 10–24)
BUN: 10 mg/dL (ref 8–27)
CHLORIDE: 99 mmol/L (ref 96–106)
CO2: 22 mmol/L (ref 20–29)
CREATININE: 1.03 mg/dL (ref 0.76–1.27)
Calcium: 9.2 mg/dL (ref 8.6–10.2)
GFR calc Af Amer: 82 mL/min/{1.73_m2} (ref >59)
GFR calc non Af Amer: 71 mL/min/{1.73_m2} (ref >59)
GLUCOSE: 100 mg/dL — AB (ref 65–99)
Potassium: 4.4 mmol/L (ref 3.5–5.2)
SODIUM: 140 mmol/L (ref 134–144)

## 2017-10-06 LAB — LIPID PANEL
CHOL/HDL RATIO: 2.3 ratio (ref 0.0–5.0)
CHOLESTEROL TOTAL: 125 mg/dL (ref 100–199)
HDL: 55 mg/dL (ref >39)
LDL CALC: 45 mg/dL (ref 0–99)
TRIGLYCERIDES: 123 mg/dL (ref 0–149)
VLDL CHOLESTEROL CAL: 25 mg/dL (ref 5–40)

## 2017-10-06 LAB — HEPATIC FUNCTION PANEL
ALBUMIN: 4.7 g/dL (ref 3.5–4.8)
ALK PHOS: 57 IU/L (ref 39–117)
ALT: 15 IU/L (ref 0–44)
AST: 19 IU/L (ref 0–40)
Bilirubin Total: 0.8 mg/dL (ref 0.0–1.2)
Bilirubin, Direct: 0.24 mg/dL (ref 0.00–0.40)
TOTAL PROTEIN: 7 g/dL (ref 6.0–8.5)

## 2017-10-06 MED ORDER — LOSARTAN POTASSIUM 50 MG PO TABS: ORAL_TABLET | ORAL | 3 refills | Status: DC

## 2017-10-06 NOTE — Addendum Note (Signed)
Addended by: [name] on: 10/06/2017 01:38 PM   Modules accepted: Orders

## 2017-10-06 NOTE — Patient Instructions (Signed)
Medication Instructions:  Your physician has recommended you make the following change in your medication:   STOP: PLAVIX  INCREASE: losartan to 50 mg(1 tablet) in the morning and 25mg (0.5 tab) in the eveving  Labwork: Today for kidney function, liver function and fasting lipids  Testing/Procedures: None ordered   Follow-Up: Your physician recommends that you schedule a follow-up appointment in the hypertension clinic next week   Your physician recommends that you schedule a follow-up appointment in: 3 month with an APP  Your physician wants you to follow-up in: 1 year with Dr. Radford Pax. You will receive a reminder letter in the mail two months in advance. If you don't receive a letter, please call our office to schedule the follow-up appointment.   Any Other Special Instructions Will Be Listed Below (If Applicable).  Referral to ENT, Dr. Redmond Baseman for nose bleeds    If you need a refill on your cardiac medications before your next appointment, please call your pharmacy.

## 2017-10-08 ENCOUNTER — Ambulatory Visit: Payer: Medicare HMO | Admitting: Podiatry

## 2017-10-11 ENCOUNTER — Telehealth: Payer: Self-pay | Admitting: Family Medicine

## 2017-10-11 MED ORDER — ROSUVASTATIN CALCIUM 20 MG PO TABS: 20.0000 mg | ORAL_TABLET | Freq: Every day | ORAL | 0 refills | Status: DC

## 2017-10-11 NOTE — Telephone Encounter (Signed)
Copied from [place] [phone]. Topic: Quick Communication - Rx Refill/Question >> Oct 11, 2017  1:26 PM Synthia Innocent wrote: Medication: rosuvastatin (CRESTOR) 20 MG tablet   Has the patient contacted their pharmacy? Yes, need new script, last filled 06/05/2017, New pharmacy   (Agent: If no, request that the patient contact the pharmacy for the refill.)   Preferred [facility] (with phone [facility] [place]: Please be advised that RX refills may take up to 3 business days. We ask that you follow-up with your pharmacy.

## 2017-10-11 NOTE — Telephone Encounter (Signed)
Called patient to verify pharmacy to send in refill of Crestor, [facility] is where to send in refill.

## 2017-10-11 NOTE — Addendum Note (Signed)
Addended by: Matilde Sprang on: 10/11/2017 05:33 PM   Modules accepted: Orders

## 2017-10-14 ENCOUNTER — Ambulatory Visit (INDEPENDENT_AMBULATORY_CARE_PROVIDER_SITE_OTHER): Payer: Medicare HMO | Admitting: Pharmacist

## 2017-10-14 VITALS — BP 138/84 | HR 69

## 2017-10-14 DIAGNOSIS — I10 Essential (primary) hypertension: Secondary | ICD-10-CM | POA: Diagnosis not present

## 2017-10-14 MED ORDER — LOSARTAN POTASSIUM 50 MG PO TABS: ORAL_TABLET | ORAL | 3 refills | Status: DC

## 2017-10-14 NOTE — Patient Instructions (Addendum)
It was nice to see you today  Increase your losartan to 50mg  in the morning and 50mg  in the evening.  Continue taking your metoprolol 25mg  twice daily  Move your evening doses from 4pm to 7-8pm to space out your doses a bit better  Check your blood pressure 2 hours after you take your medication  Follow up in clinic in 2-3 weeks. Please bring in your home blood pressure cuff to your next visit

## 2017-10-14 NOTE — Progress Notes (Signed)
Patient ID: Brandon Haynes                 DOB: [birth year]                      MRN: [id number]     HPI: Brandon Haynes is a 77 y.o. male referred by Dr. Radford Pax to HTN clinic. 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 His angina has presented as CP and hypotension in the past. He presented with hypertensive urgency to the ER in November in the setting of epistaxis and it was thought that his BP was related to the stress of the event. He was seen in the office 1 week ago and reported elevated BP readings at home ranging 116-192/70-105mmHg. Most elevated readings were early in the AM. His losartan dose was increased to 50mg  AM and 25mg  PM. He presents today for follow up.  Pt presents today in good spirits. He is tolerating his dose increase of losartan well. His BP readings at home remain labile. Low reading of 127/77, high reading of 168/95, averaging 130-150/60-90s. He uses a bicep cuff that he has had for 4-5 years. Most of his readings are checked just prior to taking his medications. He takes his AM doses between 6:30-8am but takes his evening doses around 4pm. He has headaches and ringing in his ears with slightly blurry vision when his BP is elevated in the upper end of this range.   He states he tends to gain 6-8 lbs in the winter but loses it during the summer. His labile BP readings began in November, however pt does not think his higher BP readings are correlated with slight seasonal weight gain.  Current HTN meds: losartan 50mg  AM + 25mg  PM, Lopressor 25mg  BID BP goal: <130/16mmHg  Family History: CHF and DM in his mother. Heart disease in his brother and father.  Social History: Former smoker 2 PPD for 35 years. Quit in 2000. Stopped drinking alcohol in 1994. Denies illicit drug use.  Diet: Follows a low sodium diet. Drinks 3 cups of coffee each day and 1 Coke Zero. Likes sugar-free cookies. Eats cheerios in the  morning. No sugar-added ice cream.   Exercise: Walks and lifts some weights at home.  Wt Readings from Last 3 Encounters:  10/06/17 213 lb 12.8 oz (97 kg)  09/02/17 213 lb 6.4 oz (96.8 kg)  08/27/17 210 lb (95.3 kg)   BP Readings from Last 3 Encounters:  10/06/17 122/88  09/02/17 136/88  08/27/17 (!) 163/92   Pulse Readings from Last 3 Encounters:  10/06/17 62  09/02/17 68  08/27/17 62    Renal function: Estimated Creatinine Clearance: 73.2 mL/min (by C-G formula based on SCr of 1.03 mg/dL).  Past Medical History:  Diagnosis Date  . Allergic rhinitis    allergy shots via [place]  . Allergy   . Anxiety   . 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 CAD, multiple vessel    a. CP/near-syncope 11/2014 s/p DES to MLAD, occluded RCA after takeoff of RV marginal with collaterals treated medically, otherwise mild nonobstructive disease. EF 55%;  b. 07/2015 Lexiscan CL: EF 56%, no ischemia/infarct.  Remains on DAPT as of 03/2017 cards f/u.  Marland Kitchen Cancer ([place])    skin  . Chronic low back pain   . DDD (degenerative disc disease), lumbar   . GERD (gastroesophageal reflux disease)    PPI  bid needed to control sx's  . Gout   . Hearing loss   . History  of basal cell carcinoma   . History of adenomatous polyp of colon   . Hypercholesteremia   . Hypertension   . Iron deficiency anemia 2017   Colonoscopy, EGD, and capsule endoscopy w/out obvious cause/source found.  Oral iron helping as of 03/2016.  . Leg pain 03/06/2016  . 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 Peripheral vascular disease ([place])   . Pleural thickening    chronic  . Prediabetes    HbA1c 6 % 09/2015.    Current Outpatient Medications on File Prior to Visit  Medication Sig Dispense Refill  . acetaminophen (TYLENOL) 500 MG tablet Take 500 mg by mouth every 6 (six) hours as needed for moderate pain.    Marland Kitchen allopurinol (ZYLOPRIM) 300 MG tablet Take 1 tablet (300 mg total) daily by mouth. 90 tablet 1  . aspirin 81 MG  tablet Take 81 mg by mouth daily.      . clotrimazole-betamethasone (LOTRISONE) cream Apply 1 application topically daily.  1  . desonide (DESOWEN) 0.05 % cream Apply 1 application topically daily. Reported on 02/27/2016  1  . diphenhydrAMINE (BENADRYL) 25 MG tablet Take 50 mg by mouth 3 (three) times daily as needed for itching or allergies.     . fluticasone (FLONASE) 50 MCG/ACT nasal spray Place 1 spray into both nostrils daily.    Marland Kitchen gabapentin (NEURONTIN) 300 MG capsule Take 600 mg by mouth 2 (two) times daily.     Marland Kitchen loratadine (CLARITIN) 10 MG tablet Take 20 mg by mouth daily. As needed for allergy    . losartan (COZAAR) 50 MG tablet Take 50 mg(1 tab) in the morning and 25mg (0.5 tabs) in the evening 180 tablet 3  . metoprolol tartrate (LOPRESSOR) 25 MG tablet Take 1 tablet by mouth two  times daily 180 tablet 1  . nitroGLYCERIN (NITROSTAT) 0.4 MG SL tablet Place 1 tablet (0.4 mg total) under the tongue every 5 (five) minutes as needed for chest pain. 25 tablet 3  . pantoprazole (PROTONIX) 40 MG tablet Take 1 tablet (40 mg total) by mouth daily. 90 tablet 3  . PREVIDENT 5000 BOOSTER PLUS 1.1 % PSTE Take by mouth as directed. APPLY TO TEETH AT BEDTIME  1  . rosuvastatin (CRESTOR) 20 MG tablet Take 1 tablet (20 mg total) by mouth daily. 90 tablet 0  . sildenafil (VIAGRA) 100 MG tablet Take 100 mg by mouth daily as needed for erectile dysfunction.    . traMADol (ULTRAM) 50 MG tablet Take 1 tablet (50 mg total) by mouth 3 (three) times daily as needed. for pain 270 tablet 0   No current facility-administered medications on file prior to visit.     No Known Allergies   Assessment/Plan:  1. Hypertension - BP close to goal <130/46mmHg in clinic however home BP readings remain labile. Pt has been taking his second doses of metoprolol and losartan ~8 hours after his morning doses. Advised him to separate his BID dosing by 12 hours to help steady his BP readings. Will also increase afternoon dose of  losartan to 50mg  and keep AM dose at 50mg . Will continue metoprolol tartrate 25mg  BID. Encouraged pt to check his BP 2 hours after taking his medications rather than immediately beforehand. F/u in HTN clinic in 2 weeks for BP check and BMET. Advised pt to bring his home cuff into clinic for calibration as well.   Vyolet Sakuma E.  Berania Peedin, PharmD, CPP, [place] [address], [place], [place] [address] Phone: [phone]; Fax: [phone] 10/14/2017 4:13 PM

## 2017-10-19 DIAGNOSIS — J3089 Other allergic rhinitis: Secondary | ICD-10-CM | POA: Diagnosis not present

## 2017-10-19 DIAGNOSIS — J301 Allergic rhinitis due to pollen: Secondary | ICD-10-CM | POA: Diagnosis not present

## 2017-10-19 DIAGNOSIS — R040 Epistaxis: Secondary | ICD-10-CM | POA: Insufficient documentation

## 2017-10-21 NOTE — Progress Notes (Signed)
Subjective:   Brandon Haynes is a 77 y.o. male who presents for Medicare Annual/Subsequent preventive examination.  Review of Systems:  No ROS.  Medicare Wellness Visit. Additional risk factors are reflected in the social history.  Cardiac Risk Factors include: advanced age (>54men, >72 women);dyslipidemia;hypertension;male gender;family history of premature cardiovascular disease;sedentary lifestyle   Sleep patterns: Sleeps 6 hours, up to void x 2.  Home Safety/Smoke Alarms: Feels safe in home. Smoke alarms in place.  Living environment; residence and Firearm Safety: Lives with wife in 1 story home.  Seat Belt Safety/Bike Helmet: Wears seat belt.    Male:   CCS-Colonoscopy 03/11/2017, polyps. Recall 3 years.       PSA-  Lab Results  Component Value Date   PSA 2.13 03/22/2014   PSA 1.77 02/21/2013   PSA 1.90 02/25/2012       Objective:    Vitals: BP 138/90 (BP Location: Left Arm, Patient Position: Sitting, Cuff Size: Normal)   Pulse 76   Temp 98 F (36.7 C) (Oral)   Resp 18   Ht 6\' 2"  (1.88 m)   Wt 215 lb 6.4 oz (97.7 kg)   SpO2 98%   BMI 27.66 kg/m   Body mass index is 27.66 kg/m.  Advanced Directives 10/22/2017 08/27/2017 02/20/2016 08/23/2015 08/23/2015 12/03/2014 12/03/2014  Does Patient Have a Medical Advance Directive? No No No No No No No  Would patient like information on creating a medical advance directive? Yes (MAU/Ambulatory/Procedural Areas - Information given) No - Patient declined - No - patient declined information No - patient declined information No - patient declined information -    Tobacco Social History   Tobacco Use  Smoking Status Former Smoker  . Packs/day: 2.00  . Years: 35.00  . Pack years: 70.00  . Types: Cigarettes  . Last attempt to quit: 09/28/1998  . Years since quitting: 19.0  Smokeless Tobacco Former Systems developer  . Types: Snuff  . Quit date: 02/09/1986  Tobacco Comment   as a teenager     Counseling given: Not Answered Comment: as a  teenager    Past Medical History:  Diagnosis Date  . Allergic rhinitis    allergy shots via [place]  . Allergy   . Anxiety   . 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 CAD, multiple vessel    a. CP/near-syncope 11/2014 s/p DES to MLAD, occluded RCA after takeoff of RV marginal with collaterals treated medically, otherwise mild nonobstructive disease. EF 55%;  b. 07/2015 Lexiscan CL: EF 56%, no ischemia/infarct.  Remains on DAPT as of 03/2017 cards f/u.  Marland Kitchen Cancer ([place])    skin  . Chronic low back pain   . DDD (degenerative disc disease), lumbar   . GERD (gastroesophageal reflux disease)    PPI bid needed to control sx's  . Gout   . Hearing loss   . History  of basal cell carcinoma   . History of adenomatous polyp of colon   . Hypercholesteremia   . Hypertension   . Iron deficiency anemia 2017   Colonoscopy, EGD, and capsule endoscopy w/out obvious cause/source found.  Oral iron helping as of 03/2016.  . Leg pain 03/06/2016  . 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 Peripheral vascular disease ([place])   . Pleural thickening    chronic  . Prediabetes    HbA1c 6 % 09/2015.   Past Surgical History:  Procedure Laterality Date  . back injection  oct 2011, 07/2010, 12/2012   no surgery  .  Capsule endoscopy  02/2016   mild duodenitis, o/w NEG  . CARDIOVASCULAR STRESS TEST  05/2009; 07/2015  . carotid dopplers  11/2014   Bilateral - 1% to 9% ICA stenosis. Vertebral artery flow is  . COLONOSCOPY  08/2008; 02/2016; 02/2017   2017: 12 polyps (adenomatous).  02/2017 repeat TCS--multiple polyps: recall 3 yrs.  . CORONARY ANGIOPLASTY WITH STENT PLACEMENT  12/04/2014   4.0 x 12 mm Synergy stent to the mid LAD  . ESOPHAGOGASTRODUODENOSCOPY  02/2016   Gastritis.  NEG h pylori.    . LE arterial vascular study  02/2016   Per Dr. Radford Pax, no evidence of peripheral vascular disease  . LEFT HEART CATHETERIZATION WITH CORONARY ANGIOGRAM N/A 12/04/2014   Procedure: LEFT HEART CATHETERIZATION WITH  CORONARY ANGIOGRAM;  Surgeon: Jolaine Artist, MD; LAD 99% then aneurysmal, OM1 40%, CFX 50%, RCA 40%, 50% then occluded  . ROTATOR CUFF REPAIR Left 09/2013   left  . SHOULDER INJECTION  feb 2014  . TRANSTHORACIC ECHOCARDIOGRAM  11/2014   EF 50-55%, some wall motion abnormalities noted, grade I diast dysfxn   Family History  Problem Relation Age of Onset  . Lung cancer Father   . Heart disease Father   . Breast cancer Mother   . Congestive Heart Failure Mother   . Diabetes Mother   . Breast cancer Sister   . Heart disease Brother   . COPD Brother   . Colon cancer Neg Hx   . Esophageal cancer Neg Hx   . Pancreatic cancer Neg Hx   . Rectal cancer Neg Hx   . Stomach cancer Neg Hx   . Prostate cancer Neg Hx    Social History   Socioeconomic History  . Marital status: Married    Spouse name: Vaughan Basta  . Number of children: 3  . Years of education: None  . Highest education level: None  Social Needs  . Financial resource strain: None  . Food insecurity - worry: None  . Food insecurity - inability: None  . Transportation needs - medical: None  . Transportation needs - non-medical: None  Occupational History  . Occupation: retired  Tobacco Use  . Smoking status: Former Smoker    Packs/day: 2.00    Years: 35.00    Pack years: 70.00    Types: Cigarettes    Last attempt to quit: 09/28/1998    Years since quitting: 19.0  . Smokeless tobacco: Former Systems developer    Types: Snuff    Quit date: 02/09/1986  . Tobacco comment: as a teenager  Substance and Sexual Activity  . Alcohol use: No    Alcohol/week: 0.0 oz    Comment: quit 1994  . Drug use: No  . Sexual activity: None  Other Topics Concern  . None  Social History Narrative   Married, 3 children.   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   Former smoker: quit approx around [place].  80 pack-yr hx.   Alcohol-none, used to drink a lot of beer on weekends.   No drugs.   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   Diet: fair    Outpatient Encounter Medications as of 10/22/2017  Medication Sig  . acetaminophen (TYLENOL) 500 MG tablet Take 500 mg by mouth every 6 (six) hours as needed for moderate pain.  Marland Kitchen allopurinol (ZYLOPRIM) 300 MG tablet Take 1 tablet (300 mg total) daily by mouth.  Marland Kitchen  aspirin 81 MG tablet Take 81 mg by mouth daily.    . clotrimazole-betamethasone (LOTRISONE) cream Apply 1 application topically daily.  Marland Kitchen desonide (DESOWEN) 0.05 % cream Apply 1 application topically daily. Reported on 02/27/2016  . diphenhydrAMINE (BENADRYL) 25 MG tablet Take 50 mg by mouth 3 (three) times daily as needed for itching or allergies.   Marland Kitchen EPINEPHrine 0.3 mg/0.3 mL IJ SOAJ injection AS DIRECTED AS NEEDED  . fluticasone (FLONASE) 50 MCG/ACT nasal spray Place 1 spray into both nostrils daily.  Marland Kitchen gabapentin (NEURONTIN) 300 MG capsule Take 600 mg by mouth 2 (two) times daily.   Marland Kitchen loratadine (CLARITIN) 10 MG tablet Take 20 mg by mouth daily. As needed for allergy  . losartan (COZAAR) 50 MG tablet Take 1 tablet twice daily  . metoprolol tartrate (LOPRESSOR) 25 MG tablet Take 1 tablet by mouth two  times daily  . nitroGLYCERIN (NITROSTAT) 0.4 MG SL tablet Place 1 tablet (0.4 mg total) under the tongue every 5 (five) minutes as needed for chest pain.  . pantoprazole (PROTONIX) 40 MG tablet Take 1 tablet (40 mg total) by mouth daily.  Marland Kitchen PREVIDENT 5000 BOOSTER PLUS 1.1 % PSTE Take by mouth as directed. APPLY TO TEETH AT BEDTIME  . rosuvastatin (CRESTOR) 20 MG tablet Take 1 tablet (20 mg total) by mouth daily.  . sildenafil (VIAGRA) 100 MG tablet Take 100 mg by mouth daily as needed for erectile dysfunction.  . traMADol (ULTRAM) 50 MG tablet Take 1 tablet (50 mg total) by mouth 3 (three) times daily as needed. for pain  . Zoster Vaccine Adjuvanted [facility]) injection Inject 0.5 mLs into the muscle once for 1 dose.   No facility-administered encounter  medications on file as of 10/22/2017.     Activities of Daily Living In your present state of health, do you have any difficulty performing the following activities: 10/22/2017  Hearing? N  Vision? N  Difficulty concentrating or making decisions? N  Walking or climbing stairs? N  Dressing or bathing? N  Doing errands, shopping? N  Preparing Food and eating ? N  Using the Toilet? N  In the past six months, have you accidently leaked urine? N  Do you have problems with loss of bowel control? N  Managing your Medications? N  Managing your Finances? N  Housekeeping or managing your Housekeeping? N  Some recent data might be hidden    Patient Care Team: Tammi Sou, MD as PCP - General (Family Medicine) Jovita Gamma, MD as Consulting Physician (Neurosurgery) Mcarthur Rossetti, MD as Consulting Physician (Orthopedic Surgery) Lavonna Monarch, MD as Consulting Physician (Dermatology) Sueanne Margarita, MD as Consulting Physician (Cardiology) Gardiner Barefoot, DPM as Consulting Physician (Podiatry) Armbruster, Carlota Raspberry, MD as Consulting Physician (Gastroenterology) Melida Quitter, MD as Consulting Physician (Otolaryngology)   Assessment:   This is a routine wellness examination for Daisy.  Exercise Activities and Dietary recommendations Current Exercise Habits: The patient does not participate in regular exercise at present, Exercise limited by: None identified   Diet (meal preparation, eat out, water intake, caffeinated beverages, dairy products, fruits and vegetables): Drinks coffee, water and Coke Zero.   Eats heart healthy diet, "I still like junk food".   Goals    . Patient Stated     Maintain current health by staying active and eating heart healthy.        Fall Risk Fall Risk  10/22/2017 10/20/2016 10/18/2015 04/18/2015 02/21/2013  Falls in the past year? No No Yes Yes No  Number falls in past yr: - - 2 or more 2 or more -  Injury with Fall? - - No No -  Risk for  fall due to : - - History of fall(s) - -  Follow up - - Falls prevention discussed - -    Depression Screen PHQ 2/9 Scores 10/22/2017 10/20/2016 10/18/2015 04/18/2015  PHQ - 2 Score 0 0 0 0    Cognitive Function MMSE - Mini Mental State Exam 10/22/2017  Orientation to time 5  Orientation to Place 5  Registration 3  Attention/ Calculation 3  Recall 3  Language- name 2 objects 2  Language- repeat 1  Language- follow 3 step command 3  Language- read & follow direction 1  Write a sentence 1  Copy design 1  Total score 28        Immunization History  Administered Date(s) Administered  . Influenza Split 07/08/2011, 05/23/2012, 05/29/2013  . Influenza Whole 07/19/2009  . Influenza-Unspecified 07/04/2014, 06/18/2015, 06/16/2016, 06/04/2017  . Pneumococcal Conjugate-13 03/22/2014  . Pneumococcal Polysaccharide-23 07/19/2009  . Td 02/20/2010  . Zoster 09/28/2009    Screening Tests Health Maintenance  Topic Date Due  . TETANUS/TDAP  02/21/2020  . COLONOSCOPY  03/11/2020  . INFLUENZA VACCINE  Completed  . PNA vac Low Risk Adult  Completed      Plan:     Shingles vaccine at pharmacy.   Bring a copy of your living will and/or healthcare power of attorney to your next office visit.  Continue doing brain stimulating activities (puzzles, reading, adult coloring books, staying active) to keep memory sharp.    I have personally reviewed and noted the following in the patient's chart:   . Medical and social history . Use of alcohol, tobacco or illicit drugs  . Current medications and supplements . Functional ability and status . Nutritional status . Physical activity . Advanced directives . List of other physicians . Hospitalizations, surgeries, and ER visits in previous 12 months . Vitals . Screenings to include cognitive, depression, and falls . Referrals and appointments  In addition, I have reviewed and discussed with patient certain preventive protocols, quality  metrics, and best practice recommendations. A written personalized care plan for preventive services as well as general preventive health recommendations were provided to patient.     Gerilyn Nestle, RN  10/22/2017

## 2017-10-22 ENCOUNTER — Other Ambulatory Visit: Payer: Self-pay

## 2017-10-22 ENCOUNTER — Ambulatory Visit (INDEPENDENT_AMBULATORY_CARE_PROVIDER_SITE_OTHER): Payer: Medicare HMO | Admitting: Family Medicine

## 2017-10-22 ENCOUNTER — Ambulatory Visit (INDEPENDENT_AMBULATORY_CARE_PROVIDER_SITE_OTHER): Payer: Medicare HMO

## 2017-10-22 ENCOUNTER — Encounter: Payer: Self-pay | Admitting: Family Medicine

## 2017-10-22 VITALS — BP 138/90 | HR 76 | Temp 98.0°F | Resp 18 | Wt 215.0 lb

## 2017-10-22 VITALS — BP 138/90 | HR 76 | Temp 98.0°F | Resp 18 | Ht 74.0 in | Wt 215.4 lb

## 2017-10-22 DIAGNOSIS — R7303 Prediabetes: Secondary | ICD-10-CM | POA: Diagnosis not present

## 2017-10-22 DIAGNOSIS — Z23 Encounter for immunization: Secondary | ICD-10-CM | POA: Diagnosis not present

## 2017-10-22 DIAGNOSIS — Z0000 Encounter for general adult medical examination without abnormal findings: Secondary | ICD-10-CM | POA: Diagnosis not present

## 2017-10-22 LAB — POCT GLYCOSYLATED HEMOGLOBIN (HGB A1C): HEMOGLOBIN A1C: 5

## 2017-10-22 MED ORDER — ZOSTER VAC RECOMB ADJUVANTED 50 MCG/0.5ML IM SUSR: 0.5000 mL | Freq: Once | INTRAMUSCULAR | 1 refills | Status: AC

## 2017-10-22 NOTE — Patient Instructions (Signed)

## 2017-10-22 NOTE — Progress Notes (Signed)
Office Note 10/22/2017  CC:  Chief Complaint  Patient presents with  . Annual Exam    HPI:  Brandon Haynes is a 77 y.o. White male who is here for annual health maintenance exam.  Of note, he has not taken his bp med yet today. Recent mild increase in bp med with cardiologist.  Has f/u visit and labs set up with cardiologist soon.  Eyes: last exam 2 yrs ago--he'll schedule soon. Dental preventatives UTD. Exercise: trying to stay active, walking a lot. Diet: fairly healthy  Past Medical History:  Diagnosis Date  . Allergic rhinitis    allergy shots via [place]  . Allergy   . Anxiety   . 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 CAD, multiple vessel    a. CP/near-syncope 11/2014 s/p DES to MLAD, occluded RCA after takeoff of RV marginal with collaterals treated medically, otherwise mild nonobstructive disease. EF 55%;  b. 07/2015 Lexiscan CL: EF 56%, no ischemia/infarct.  Remains on DAPT as of 03/2017 cards f/u.  Marland Kitchen Cancer ([place])    skin  . Chronic low back pain   . DDD (degenerative disc disease), lumbar   . GERD (gastroesophageal reflux disease)    PPI bid needed to control sx's  . Gout   . Hearing loss   . History  of basal cell carcinoma   . History of adenomatous polyp of colon   . Hypercholesteremia   . Hypertension   . Iron deficiency anemia 2017   Colonoscopy, EGD, and capsule endoscopy w/out obvious cause/source found.  Oral iron helping as of 03/2016.  . Leg pain 03/06/2016  . 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 Peripheral vascular disease ([place])   . Pleural thickening    chronic  . Prediabetes    HbA1c 6 % 09/2015.    Past Surgical History:  Procedure Laterality Date  . back injection  oct 2011, 07/2010, 12/2012   no surgery  . Capsule endoscopy  02/2016   mild duodenitis, o/w NEG  . CARDIOVASCULAR STRESS TEST  05/2009; 07/2015  . carotid dopplers  11/2014   Bilateral - 1% to 9% ICA stenosis. Vertebral artery flow is  . COLONOSCOPY  08/2008;  02/2016; 02/2017   2017: 12 polyps (adenomatous).  02/2017 repeat TCS--multiple polyps: recall 3 yrs.  . CORONARY ANGIOPLASTY WITH STENT PLACEMENT  12/04/2014   4.0 x 12 mm Synergy stent to the mid LAD  . ESOPHAGOGASTRODUODENOSCOPY  02/2016   Gastritis.  NEG h pylori.    . LE arterial vascular study  02/2016   Per Dr. Radford Pax, no evidence of peripheral vascular disease  . LEFT HEART CATHETERIZATION WITH CORONARY ANGIOGRAM N/A 12/04/2014   Procedure: LEFT HEART CATHETERIZATION WITH CORONARY ANGIOGRAM;  Surgeon: Jolaine Artist, MD; LAD 99% then aneurysmal, OM1 40%, CFX 50%, RCA 40%, 50% then occluded  . ROTATOR CUFF REPAIR Left 09/2013   left  . SHOULDER INJECTION  feb 2014  . TRANSTHORACIC ECHOCARDIOGRAM  11/2014   EF 50-55%, some wall motion abnormalities noted, grade I diast dysfxn    Family History  Problem Relation Age of Onset  . Lung cancer Father   . Heart disease Father   . Breast cancer Mother   . Congestive Heart Failure Mother   . Diabetes Mother   . Breast cancer Sister   . Heart disease Brother   . COPD Brother   . Colon cancer Neg Hx   . Esophageal cancer Neg Hx   . Pancreatic cancer Neg Hx   .  Rectal cancer Neg Hx   . Stomach cancer Neg Hx   . Prostate cancer Neg Hx     Social History   Socioeconomic History  . Marital status: Married    Spouse name: Vaughan Basta  . Number of children: 3  . Years of education: Not on file  . Highest education level: Not on file  Social Needs  . Financial resource strain: Not on file  . Food insecurity - worry: Not on file  . Food insecurity - inability: Not on file  . Transportation needs - medical: Not on file  . Transportation needs - non-medical: Not on file  Occupational History  . Occupation: retired  Tobacco Use  . Smoking status: Former Smoker    Packs/day: 2.00    Years: 35.00    Pack years: 70.00    Types: Cigarettes    Last attempt to quit: 09/28/1998    Years since quitting: 19.0  . Smokeless tobacco: Former Systems developer     Types: Snuff    Quit date: 02/09/1986  . Tobacco comment: as a teenager  Substance and Sexual Activity  . Alcohol use: No    Alcohol/week: 0.0 oz    Comment: quit 1994  . Drug use: No  . Sexual activity: Not on file  Other Topics Concern  . Not on file  Social History Narrative   Married, 3 children.   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   Orig from [place], [place] same [place] all his [place].   Former smoker: quit approx around [place].  80 pack-yr hx.   Alcohol-none, used to drink a lot of beer on weekends.  No drugs.   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   Diet: fair    Outpatient Medications Prior to Visit  Medication Sig Dispense Refill  . acetaminophen (TYLENOL) 500 MG tablet Take 500 mg by mouth every 6 (six) hours as needed for moderate pain.    Marland Kitchen allopurinol (ZYLOPRIM) 300 MG tablet Take 1 tablet (300 mg total) daily by mouth. 90 tablet 1  . aspirin 81 MG tablet Take 81 mg by mouth daily.      . clotrimazole-betamethasone (LOTRISONE) cream Apply 1 application topically daily.  1  . desonide (DESOWEN) 0.05 % cream Apply 1 application topically daily. Reported on 02/27/2016  1  . diphenhydrAMINE (BENADRYL) 25 MG tablet Take 50 mg by mouth 3 (three) times daily as needed for itching or allergies.     . fluticasone (FLONASE) 50 MCG/ACT nasal spray Place 1 spray into both nostrils daily.    Marland Kitchen gabapentin (NEURONTIN) 300 MG capsule Take 600 mg by mouth 2 (two) times daily.     Marland Kitchen loratadine (CLARITIN) 10 MG tablet Take 20 mg by mouth daily. As needed for allergy    . losartan (COZAAR) 50 MG tablet Take 1 tablet twice daily 180 tablet 3  . metoprolol tartrate (LOPRESSOR) 25 MG tablet Take 1 tablet by mouth two  times daily 180 tablet 1  . nitroGLYCERIN (NITROSTAT) 0.4 MG SL tablet Place 1 tablet (0.4 mg total) under the tongue every 5 (five) minutes as needed for chest pain. 25 tablet 3  . pantoprazole (PROTONIX) 40 MG tablet Take 1 tablet (40 mg  total) by mouth daily. 90 tablet 3  . PREVIDENT 5000 BOOSTER PLUS 1.1 % PSTE Take by mouth as directed. APPLY TO TEETH AT BEDTIME  1  . rosuvastatin (CRESTOR) 20 MG tablet Take 1 tablet (20 mg total) by  mouth daily. 90 tablet 0  . sildenafil (VIAGRA) 100 MG tablet Take 100 mg by mouth daily as needed for erectile dysfunction.    . traMADol (ULTRAM) 50 MG tablet Take 1 tablet (50 mg total) by mouth 3 (three) times daily as needed. for pain 270 tablet 0   No facility-administered medications prior to visit.     No Known Allergies  ROS Review of Systems  Constitutional: Negative for appetite change, chills, fatigue and fever.  HENT: Negative for congestion, dental problem, ear pain and sore throat.   Eyes: Negative for discharge, redness and visual disturbance.  Respiratory: Negative for cough, chest tightness, shortness of breath and wheezing.   Cardiovascular: Negative for chest pain, palpitations and leg swelling.  Gastrointestinal: Negative for abdominal pain, blood in stool, diarrhea, nausea and vomiting.  Genitourinary: Negative for difficulty urinating, dysuria, flank pain, frequency, hematuria and urgency.  Musculoskeletal: Negative for arthralgias, back pain, joint swelling, myalgias and neck stiffness.  Skin: Negative for pallor and rash.  Neurological: Negative for dizziness, speech difficulty, weakness and headaches.  Hematological: Negative for adenopathy. Does not bruise/bleed easily.  Psychiatric/Behavioral: Negative for confusion and sleep disturbance. The patient is not nervous/anxious.     PE; Blood pressure 138/90, pulse 76, temperature 98 F (36.7 C), temperature source Oral, resp. rate 18, weight 215 lb (97.5 kg), SpO2 98 %. Gen: Alert, well appearing.  Patient is oriented to person, place, time, and situation. AFFECT: pleasant, lucid thought and speech. ENT: Ears: EACs clear, normal epithelium.  TMs with good light reflex and landmarks bilaterally.  Eyes: no  injection, icteris, swelling, or exudate.  EOMI, PERRLA. Nose: no drainage or turbinate edema/swelling.  No injection or focal lesion.  Mouth: lips without lesion/swelling.  Oral mucosa pink and moist.  Dentition intact and without obvious caries or gingival swelling.  Oropharynx without erythema, exudate, or swelling.  Neck: supple/nontender.  No LAD, mass, or TM.  Carotid pulses 2+ bilaterally, without bruits. CV: RRR, no m/r/g.   LUNGS: CTA bilat, nonlabored resps, good aeration in all lung fields. ABD: soft, NT, ND, BS normal.  No hepatospenomegaly or mass.  No bruits. EXT: no clubbing, cyanosis, or edema.  Musculoskeletal: no joint swelling, erythema, warmth, or tenderness.  ROM of all joints intact. Skin - no sores or suspicious lesions or rashes or color changes Rectal: deferred  Pertinent labs:  Lab Results  Component Value Date   TSH 1.29 03/22/2014   Lab Results  Component Value Date   WBC 9.2 09/02/2017   HGB 14.9 09/02/2017   HCT 43.4 09/02/2017   MCV 95.3 09/02/2017   PLT 190.0 09/02/2017   Lab Results  Component Value Date   CREATININE 1.03 10/06/2017   BUN 10 10/06/2017   NA 140 10/06/2017   K 4.4 10/06/2017   CL 99 10/06/2017   CO2 22 10/06/2017   Lab Results  Component Value Date   ALT 15 10/06/2017   AST 19 10/06/2017   ALKPHOS 57 10/06/2017   BILITOT 0.8 10/06/2017   Lab Results  Component Value Date   CHOL 125 10/06/2017   Lab Results  Component Value Date   HDL 55 10/06/2017   Lab Results  Component Value Date   LDLCALC 45 10/06/2017   Lab Results  Component Value Date   TRIG 123 10/06/2017   Lab Results  Component Value Date   CHOLHDL 2.3 10/06/2017   Lab Results  Component Value Date   PSA 2.13 03/22/2014   PSA 1.77 02/21/2013  PSA 1.90 02/25/2012   Lab Results  Component Value Date   HGBA1C 5.0 10/22/2017   POC HbA1c today 5.0%  ASSESSMENT AND PLAN:   Health maintenance exam: Reviewed age and gender appropriate health  maintenance issues (prudent diet, regular exercise, health risks of tobacco and excessive alcohol, use of seatbelts, fire alarms in home, use of sunscreen).  Also reviewed age and gender appropriate health screening as well as vaccine recommendations. Vaccines:  All UTD.  Shingrix rx to be sent to his pharmacy. Labs: Labs done 10/06/17 (CMET, FLP) excellent.  CBC 09/02/17 normal.  For hx of prediabetes, checked POC HbA1c today excellent at 5.0% . Prostate ca screening: we have decided he needs no further screening. Colon ca screening: hx of adenomatous polyps, next colonoscopy due 02/2020.  An After Visit Summary was printed and given to the patient.  FOLLOW UP:  Return in about 6 months (around 04/21/2018) for routine chronic illness f/u.  Signed:  Crissie Sickles, MD           10/22/2017

## 2017-10-22 NOTE — Patient Instructions (Addendum)
Shingles vaccine at pharmacy.   Bring a copy of your living will and/or healthcare power of attorney to your next office visit.  Continue doing brain stimulating activities (puzzles, reading, adult coloring books, staying active) to keep memory sharp.   Fall Prevention in the Home Falls can cause injuries. They can happen to people of all ages. There are many things you can do to make your home safe and to help prevent falls. What can I do on the outside of my home?  Regularly fix the edges of walkways and driveways and fix any cracks.  Remove anything that might make you trip as you walk through a door, such as a raised step or threshold.  Trim any bushes or trees on the path to your home.  Use bright outdoor lighting.  Clear any walking paths of anything that might make someone trip, such as rocks or tools.  Regularly check to see if handrails are loose or broken. Make sure that both sides of any steps have handrails.  Any raised decks and porches should have guardrails on the edges.  Have any leaves, snow, or ice cleared regularly.  Use sand or salt on walking paths during winter.  Clean up any spills in your garage right away. This includes oil or grease spills. What can I do in the bathroom?  Use night lights.  Install grab bars by the toilet and in the tub and shower. Do not use towel bars as grab bars.  Use non-skid mats or decals in the tub or shower.  If you need to sit down in the shower, use a plastic, non-slip stool.  Keep the floor dry. Clean up any water that spills on the floor as soon as it happens.  Remove soap buildup in the tub or shower regularly.  Attach bath mats securely with double-sided non-slip rug tape.  Do not have throw rugs and other things on the floor that can make you trip. What can I do in the bedroom?  Use night lights.  Make sure that you have a light by your bed that is easy to reach.  Do not use any sheets or blankets that are  too big for your bed. They should not hang down onto the floor.  Have a firm chair that has side arms. You can use this for support while you get dressed.  Do not have throw rugs and other things on the floor that can make you trip. What can I do in the kitchen?  Clean up any spills right away.  Avoid walking on wet floors.  Keep items that you use a lot in easy-to-reach places.  If you need to reach something above you, use a strong step stool that has a grab bar.  Keep electrical cords out of the way.  Do not use floor polish or wax that makes floors slippery. If you must use wax, use non-skid floor wax.  Do not have throw rugs and other things on the floor that can make you trip. What can I do with my stairs?  Do not leave any items on the stairs.  Make sure that there are handrails on both sides of the stairs and use them. Fix handrails that are broken or loose. Make sure that handrails are as long as the stairways.  Check any carpeting to make sure that it is firmly attached to the stairs. Fix any carpet that is loose or worn.  Avoid having throw rugs at the top or   or bottom of the stairs. If you do have throw rugs, attach them to the floor with carpet tape.  Make sure that you have a light switch at the top of the stairs and the bottom of the stairs. If you do not have them, ask someone to add them for you. What else can I do to help prevent falls?  Wear shoes that: ? Do not have high heels. ? Have rubber bottoms. ? Are comfortable and fit you well. ? Are closed at the toe. Do not wear sandals.  If you use a stepladder: ? Make sure that it is fully opened. Do not climb a closed stepladder. ? Make sure that both sides of the stepladder are locked into place. ? Ask someone to hold it for you, if possible.  Clearly mark and make sure that you can see: ? Any grab bars or handrails. ? First and last steps. ? Where the edge of each step is.  Use tools that help you  move around (mobility aids) if they are needed. These include: ? Canes. ? Walkers. ? Scooters. ? Crutches.  Turn on the lights when you go into a dark area. Replace any light bulbs as soon as they burn out.  Set up your furniture so you have a clear path. Avoid moving your furniture around.  If any of your floors are uneven, fix them.  If there are any pets around you, be aware of where they are.  Review your medicines with your doctor. Some medicines can make you feel dizzy. This can increase your chance of falling. Ask your doctor what other things that you can do to help prevent falls. This information is not intended to replace advice given to you by your health care provider. Make sure you discuss any questions you have with your health care provider. Document Released: 07/11/2009 Document Revised: 02/20/2016 Document Reviewed: 10/19/2014 Elsevier Interactive Patient Education  2018 [place] Maintenance, Male A healthy lifestyle and preventive care is important for your health and wellness. Ask your health care provider about what schedule of regular examinations is right for you. What should I know about weight and diet? Eat a Healthy Diet  Eat plenty of vegetables, fruits, whole grains, low-fat dairy products, and lean protein.  Do not eat a lot of foods high in solid fats, added sugars, or salt.  Maintain a Healthy Weight Regular exercise can help you achieve or maintain a healthy weight. You should:  Do at least 150 minutes of exercise each week. The exercise should increase your heart rate and make you sweat (moderate-intensity exercise).  Do strength-training exercises at least twice a week.  Watch Your Levels of Cholesterol and Blood Lipids  Have your blood tested for lipids and cholesterol every 5 years starting at 75 years of age. If you are at high risk for heart disease, you should start having your blood tested when you are 75 years old. You may  need to have your cholesterol levels checked more often if: ? Your lipid or cholesterol levels are high. ? You are older than 75 years of age. ? You are at high risk for heart disease.  What should I know about cancer screening? Many types of cancers can be detected early and may often be prevented. Lung Cancer  You should be screened every year for lung cancer if: ? You are a current smoker who has smoked for at least 30 years. ? You are a former smoker  who has quit within the past 15 years.  Talk to your health care provider about your screening options, when you should start screening, and how often you should be screened.  Colorectal Cancer  Routine colorectal cancer screening usually begins at 75 years of age and should be repeated every 5-10 years until you are 75 years old. You may need to be screened more often if early forms of precancerous polyps or small growths are found. Your health care provider may recommend screening at an earlier age if you have risk factors for colon cancer.  Your health care provider may recommend using home test kits to check for hidden blood in the stool.  A small camera at the end of a tube can be used to examine your colon (sigmoidoscopy or colonoscopy). This checks for the earliest forms of colorectal cancer.  Prostate and Testicular Cancer  Depending on your age and overall health, your health care provider may do certain tests to screen for prostate and testicular cancer.  Talk to your health care provider about any symptoms or concerns you have about testicular or prostate cancer.  Skin Cancer  Check your skin from head to toe regularly.  Tell your health care provider about any new moles or changes in moles, especially if: ? There is a change in a mole's size, shape, or color. ? You have a mole that is larger than a pencil eraser.  Always use sunscreen. Apply sunscreen liberally and repeat throughout the day.  Protect yourself by  wearing long sleeves, pants, a wide-brimmed hat, and sunglasses when outside.  What should I know about heart disease, diabetes, and high blood pressure?  If you are 16-91 years of age, have your blood pressure checked every 3-5 years. If you are 60 years of age or older, have your blood pressure checked every year. You should have your blood pressure measured twice-once when you are at a hospital or clinic, and once when you are not at a hospital or clinic. Record the average of the two measurements. To check your blood pressure when you are not at a hospital or clinic, you can use: ? An automated blood pressure machine at a pharmacy. ? A home blood pressure monitor.  Talk to your health care provider about your target blood pressure.  If you are between 68-15 years old, ask your health care provider if you should take aspirin to prevent heart disease.  Have regular diabetes screenings by checking your fasting blood sugar level. ? If you are at a normal weight and have a low risk for diabetes, have this test once every three years after the age of 17. ? If you are overweight and have a high risk for diabetes, consider being tested at a younger age or more often.  A one-time screening for abdominal aortic aneurysm (AAA) by ultrasound is recommended for men aged 14-75 years who are current or former smokers. What should I know about preventing infection? Hepatitis B If you have a higher risk for hepatitis B, you should be screened for this virus. Talk with your health care provider to find out if you are at risk for hepatitis B infection. Hepatitis C Blood testing is recommended for:  Everyone born from [birth year] through 1963.  Anyone with known risk factors for hepatitis C.  Sexually Transmitted Diseases (STDs)  You should be screened each year for STDs including gonorrhea and chlamydia if: ? You are sexually active and are younger than 75 years of age. ?  You are older than 75 years of age  and your health care provider tells you that you are at risk for this type of infection. ? Your sexual activity has changed since you were last screened and you are at an increased risk for chlamydia or gonorrhea. Ask your health care provider if you are at risk.  Talk with your health care provider about whether you are at high risk of being infected with HIV. Your health care provider may recommend a prescription medicine to help prevent HIV infection.  What else can I do?  Schedule regular health, dental, and eye exams.  Stay current with your vaccines (immunizations).  Do not use any tobacco products, such as cigarettes, chewing tobacco, and e-cigarettes. If you need help quitting, ask your health care provider.  Limit alcohol intake to no more than 2 drinks per day. One drink equals 12 ounces of beer, 5 ounces of wine, or 1 ounces of hard liquor.  Do not use street drugs.  Do not share needles.  Ask your health care provider for help if you need support or information about quitting drugs.  Tell your health care provider if you often feel depressed.  Tell your health care provider if you have ever been abused or do not feel safe at home. This information is not intended to replace advice given to you by your health care provider. Make sure you discuss any questions you have with your health care provider. Document Released: 03/12/2008 Document Revised: 05/13/2016 Document Reviewed: 06/18/2015 Elsevier Interactive Patient Education  [employer].

## 2017-10-25 ENCOUNTER — Other Ambulatory Visit: Payer: Self-pay | Admitting: Dermatology

## 2017-10-25 DIAGNOSIS — B009 Herpesviral infection, unspecified: Secondary | ICD-10-CM | POA: Diagnosis not present

## 2017-10-25 DIAGNOSIS — L821 Other seborrheic keratosis: Secondary | ICD-10-CM | POA: Diagnosis not present

## 2017-10-25 DIAGNOSIS — D229 Melanocytic nevi, unspecified: Secondary | ICD-10-CM | POA: Diagnosis not present

## 2017-10-25 DIAGNOSIS — D485 Neoplasm of uncertain behavior of skin: Secondary | ICD-10-CM | POA: Diagnosis not present

## 2017-10-25 DIAGNOSIS — D692 Other nonthrombocytopenic purpura: Secondary | ICD-10-CM | POA: Diagnosis not present

## 2017-10-26 ENCOUNTER — Encounter: Payer: Self-pay | Admitting: Podiatry

## 2017-10-26 ENCOUNTER — Ambulatory Visit: Payer: Medicare HMO | Admitting: Podiatry

## 2017-10-26 DIAGNOSIS — B351 Tinea unguium: Secondary | ICD-10-CM

## 2017-10-26 DIAGNOSIS — M79676 Pain in unspecified toe(s): Secondary | ICD-10-CM

## 2017-10-26 DIAGNOSIS — D689 Coagulation defect, unspecified: Secondary | ICD-10-CM | POA: Diagnosis not present

## 2017-10-26 NOTE — Progress Notes (Signed)
AWV reviewed and agree.  Signed:  Crissie Sickles, MD           10/26/2017

## 2017-10-26 NOTE — Progress Notes (Signed)
Patient ID: Brandon Haynes, male   DOB: Feb 20, 1941, 77 y.o.   MRN: [id number] Complaint:  Visit Type: Patient returns to my office for continued preventative foot care services. Complaint: Patient states" my nails have grown long and thick and become painful to walk and wear shoes" . The patient presents for preventative foot care services. No changes to ROS.    Podiatric Exam: Vascular: dorsalis pedis and posterior tibial pulses are palpable bilateral. Capillary return is immediate. Temperature gradient is WNL. Skin turgor WNL  Sensorium: Normal Semmes Weinstein monofilament test. Normal tactile sensation bilaterally. Nail Exam: Pt has thick disfigured discolored nails with subungual debris noted bilateral entire nail hallux through fifth toenails Ulcer Exam: There is no evidence of ulcer or pre-ulcerative changes or infection. Orthopedic Exam: Muscle tone and strength are WNL. No limitations in general ROM. No crepitus or effusions noted. Foot type and digits show no abnormalities. Bony prominences are unremarkable.Severe arthritis 1st MPJ B/L and hammer toe second right foot. Skin: No Porokeratosis. No infection or ulcers      Onychomycosis, , Pain in right toe, pain in left toes  Treatment & Plan Procedures and Treatment: Consent by patient was obtained for treatment procedures. The patient understood the discussion of treatment and procedures well. All questions were answered thoroughly reviewed. Debridement of mycotic and hypertrophic toenails, 1 through 5 bilateral and clearing of subungual debris. No ulceration, no infection noted.  Return Visit-Office Procedure: Patient instructed to return to the office for a follow up visit 3 months for continued evaluation and treatment.    Gardiner Barefoot DPM

## 2017-10-27 NOTE — Progress Notes (Signed)
Patient ID: Brandon Haynes                 DOB: December 02, 1940                      MRN: [id number]     HPI: JERRIT HOREN is a 77 y.o. male referred by Dr. Radford Pax to HTN clinic. 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 His angina has presented as CP and hypotension in the past. He presented with hypertensive urgency to the ER in November in the setting of epistaxis and it was thought that his BP was related to the stress of the event. He has reported labile BP readings at home, usually in the AM. It was noted that elevated readings occurred before pt took his AM blood pressure medications. At hist last follow up visit 2 weeks ago, his evening dose of losartan was increased to 50mg  and he was advised to monitor his BP a few hours after taking his medication. He presents today for blood pressure follow up and BMET.  Pt presents today in good spirits. He reports that his home BP readings have improved. He is tolerating his higher dose of losartan well. He denies blurred vision, headache, falls, or dizziness. He has started to check his BP after he takes his medication rather than beforehand. Home readings are mostly ranging 110-130s/60-80s. High reading of 162/86 however this was taken before pt took his medications. Low reading of 113/63. About 1/2 of home readings are at goal, however home cuff is measuring a bit higher than clinic readings. He uses a home bicep cuff that he has had for 4-5 years.  Home BP reading: 154/92, then 152/86 Clinic BP reading: 142/84, then 134/82   Current HTN meds: losartan 50mg  BID, Lopressor 25mg  BID BP goal: <130/66mmHg  Family History: CHF and DM in his mother. Heart disease in his brother and father.  Social History: Former smoker 2 PPD for 35 years. Quit in 2000. Stopped drinking alcohol in 1994. Denies illicit drug use.  Diet: Follows a low sodium diet. Drinks 3 cups of coffee each day and 1  Coke Zero. Likes sugar-free cookies. Eats cheerios in the morning. No sugar-added ice cream.   Exercise: Walks and lifts some weights at home.  Wt Readings from Last 3 Encounters:  10/22/17 215 lb (97.5 kg)  10/22/17 215 lb 6.4 oz (97.7 kg)  10/06/17 213 lb 12.8 oz (97 kg)   BP Readings from Last 3 Encounters:  10/22/17 138/90  10/22/17 138/90  10/14/17 138/84   Pulse Readings from Last 3 Encounters:  10/22/17 76  10/22/17 76  10/14/17 69    Renal function: CrCl cannot be calculated (Patient's most recent lab result is older than the maximum 21 days allowed.).  Past Medical History:  Diagnosis Date  . Allergic rhinitis    allergy shots via [place]  . Allergy   . Anxiety   . 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 CAD, multiple vessel    a. CP/near-syncope 11/2014 s/p DES to MLAD, occluded RCA after takeoff of RV marginal with collaterals treated medically, otherwise mild nonobstructive disease. EF 55%;  b. 07/2015 Lexiscan CL: EF 56%, no ischemia/infarct.  Remains on DAPT as of 03/2017 cards f/u.  Marland Kitchen Cancer ([place])    skin  . Chronic low back pain   . DDD (degenerative disc disease), lumbar   . GERD (gastroesophageal  reflux disease)    PPI bid needed to control sx's  . Gout   . Hearing loss   . History  of basal cell carcinoma   . History of adenomatous polyp of colon   . Hypercholesteremia   . Hypertension   . Iron deficiency anemia 2017   Colonoscopy, EGD, and capsule endoscopy w/out obvious cause/source found.  Oral iron helping as of 03/2016.  . Leg pain 03/06/2016  . 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 Peripheral vascular disease ([place])   . Pleural thickening    chronic  . Prediabetes    HbA1c 6 % 09/2015.    Current Outpatient Medications on File Prior to Visit  Medication Sig Dispense Refill  . acetaminophen (TYLENOL) 500 MG tablet Take 500 mg by mouth every 6 (six) hours as needed for moderate pain.    Marland Kitchen allopurinol (ZYLOPRIM) 300 MG tablet Take 1  tablet (300 mg total) daily by mouth. 90 tablet 1  . aspirin 81 MG tablet Take 81 mg by mouth daily.      . clotrimazole-betamethasone (LOTRISONE) cream Apply 1 application topically daily.  1  . desonide (DESOWEN) 0.05 % cream Apply 1 application topically daily. Reported on 02/27/2016  1  . diphenhydrAMINE (BENADRYL) 25 MG tablet Take 50 mg by mouth 3 (three) times daily as needed for itching or allergies.     Marland Kitchen EPINEPHrine 0.3 mg/0.3 mL IJ SOAJ injection AS DIRECTED AS NEEDED    . fluticasone (FLONASE) 50 MCG/ACT nasal spray Place 1 spray into both nostrils daily.    Marland Kitchen gabapentin (NEURONTIN) 300 MG capsule Take 600 mg by mouth 2 (two) times daily.     Marland Kitchen loratadine (CLARITIN) 10 MG tablet Take 20 mg by mouth daily. As needed for allergy    . losartan (COZAAR) 50 MG tablet Take 1 tablet twice daily 180 tablet 3  . metoprolol tartrate (LOPRESSOR) 25 MG tablet Take 1 tablet by mouth two  times daily 180 tablet 1  . nitroGLYCERIN (NITROSTAT) 0.4 MG SL tablet Place 1 tablet (0.4 mg total) under the tongue every 5 (five) minutes as needed for chest pain. 25 tablet 3  . pantoprazole (PROTONIX) 40 MG tablet Take 1 tablet (40 mg total) by mouth daily. 90 tablet 3  . PREVIDENT 5000 BOOSTER PLUS 1.1 % PSTE Take by mouth as directed. APPLY TO TEETH AT BEDTIME  1  . rosuvastatin (CRESTOR) 20 MG tablet Take 1 tablet (20 mg total) by mouth daily. 90 tablet 0  . sildenafil (VIAGRA) 100 MG tablet Take 100 mg by mouth daily as needed for erectile dysfunction.    . traMADol (ULTRAM) 50 MG tablet Take 1 tablet (50 mg total) by mouth 3 (three) times daily as needed. for pain 270 tablet 0   No current facility-administered medications on file prior to visit.     No Known Allergies   Assessment/Plan:  1. Hypertension - BP improved and very close to goal <130/29mmHg in clinic. Home readings have improved and ~1/2 of readings are at goal, however home cuff is measuring higher than clinic readings. Will continue  metoprolol tartrate 25mg  BID and losartan 50mg  BID. Advised pt to continue to monitor his BP at home and to call clinic if his systolic BP remains consistently elevated > 150. F/u in HTN clinic as needed.   Megan E. Supple, PharmD, CPP, [place] [address] [place], [place] [address] Phone: [phone]; Fax: [phone] 10/27/2017 2:35 PM

## 2017-10-28 ENCOUNTER — Ambulatory Visit (INDEPENDENT_AMBULATORY_CARE_PROVIDER_SITE_OTHER): Payer: Medicare HMO | Admitting: Pharmacist

## 2017-10-28 ENCOUNTER — Other Ambulatory Visit: Payer: Medicare HMO | Admitting: *Deleted

## 2017-10-28 VITALS — BP 134/82 | HR 60

## 2017-10-28 DIAGNOSIS — I10 Essential (primary) hypertension: Secondary | ICD-10-CM

## 2017-10-28 LAB — BASIC METABOLIC PANEL
BUN/Creatinine Ratio: 8 — ABNORMAL LOW (ref 10–24)
BUN: 8 mg/dL (ref 8–27)
CALCIUM: 9.4 mg/dL (ref 8.6–10.2)
CO2: 26 mmol/L (ref 20–29)
CREATININE: 1.02 mg/dL (ref 0.76–1.27)
Chloride: 102 mmol/L (ref 96–106)
GFR calc Af Amer: 83 mL/min/{1.73_m2} (ref >59)
GFR, EST NON AFRICAN AMERICAN: 72 mL/min/{1.73_m2} (ref >59)
Glucose: 96 mg/dL (ref 65–99)
Potassium: 4 mmol/L (ref 3.5–5.2)
Sodium: 143 mmol/L (ref 134–144)

## 2017-10-28 NOTE — Patient Instructions (Addendum)
It was nice to see you today  Continue taking your losartan and metoprolol twice daily for your blood pressure  Call clinic if you notice your systolic (top) blood pressure number staying elevated above 150 consistently, call clinic

## 2017-10-29 DIAGNOSIS — J301 Allergic rhinitis due to pollen: Secondary | ICD-10-CM | POA: Diagnosis not present

## 2017-10-29 DIAGNOSIS — J3089 Other allergic rhinitis: Secondary | ICD-10-CM | POA: Diagnosis not present

## 2017-11-08 ENCOUNTER — Encounter: Payer: Self-pay | Admitting: Family Medicine

## 2017-11-09 ENCOUNTER — Ambulatory Visit: Payer: Medicare HMO | Admitting: Cardiology

## 2017-11-11 DIAGNOSIS — J3089 Other allergic rhinitis: Secondary | ICD-10-CM | POA: Diagnosis not present

## 2017-11-11 DIAGNOSIS — J301 Allergic rhinitis due to pollen: Secondary | ICD-10-CM | POA: Diagnosis not present

## 2017-11-17 DIAGNOSIS — J3089 Other allergic rhinitis: Secondary | ICD-10-CM | POA: Diagnosis not present

## 2017-11-17 DIAGNOSIS — J301 Allergic rhinitis due to pollen: Secondary | ICD-10-CM | POA: Diagnosis not present

## 2017-11-24 DIAGNOSIS — J301 Allergic rhinitis due to pollen: Secondary | ICD-10-CM | POA: Diagnosis not present

## 2017-11-24 DIAGNOSIS — J3089 Other allergic rhinitis: Secondary | ICD-10-CM | POA: Diagnosis not present

## 2017-12-09 DIAGNOSIS — J301 Allergic rhinitis due to pollen: Secondary | ICD-10-CM | POA: Diagnosis not present

## 2017-12-09 DIAGNOSIS — J3089 Other allergic rhinitis: Secondary | ICD-10-CM | POA: Diagnosis not present

## 2017-12-23 ENCOUNTER — Ambulatory Visit: Payer: Medicare HMO | Admitting: Cardiology

## 2017-12-23 ENCOUNTER — Encounter: Payer: Self-pay | Admitting: Cardiology

## 2017-12-23 VITALS — BP 128/68 | HR 67 | Ht 74.0 in | Wt 219.4 lb

## 2017-12-23 DIAGNOSIS — J3089 Other allergic rhinitis: Secondary | ICD-10-CM | POA: Diagnosis not present

## 2017-12-23 DIAGNOSIS — J301 Allergic rhinitis due to pollen: Secondary | ICD-10-CM | POA: Diagnosis not present

## 2017-12-23 DIAGNOSIS — I10 Essential (primary) hypertension: Secondary | ICD-10-CM

## 2017-12-23 NOTE — Progress Notes (Signed)
12/23/2017 Brandon Haynes   02/14/1941  [id number]  Primary Physician [place], [name], MD Primary Cardiologist: Dr. Radford Pax   Reason for Visit/CC: F/u for HTN  HPI:  Brandon Haynes is a 77 y.o. male who is being seen today for f/u for HTN.  He is followed by Dr. Radford Pax and has a history of 2 vessel ASCAD with a 99% LAD and an occluded RCA. The RCA was felt well collateralized and heunderwent PCI of the LAD. LV function was 55% at time of cath. Echocardiogram revealed akinesis of the basal-mid inferoseptal myocardium and mid-apical anteroseptal myocardium. His last nuclear stress test showed no ischemia.  He also has a history of epistaxis, iron deficiency anemia with normal EGD and capsule endoscopy June 2018.  He is on chronic iron supplementation.  He denies any recurrent epistaxis after discontinuation of Plavix. He also has a history of poorly controlled hypertension and recently required medication dose adjustments. Dr.  Radford Pax changed his losartan regimen to 50 mg BID.  He is also on metoprolol 25 mg twice daily. He was last seen by the pharmacist in the hypertension clinic on October 28, 2017.  Blood Pressure at that visit was 134/82. F/u BMP after increase of Losartan dose showed stable renal function and K. Patient was encouraged to continue his regimen and to continue to monitor BP closely at home and to follow-up in 3 months for repeat evaluation.  He presents back to clinic today for follow-up.  His blood pressure is 128/68.  He denies any symptoms.  He reports full medication compliance.  He has been keeping a log of his home blood pressure readings at home.  For the most part he has had systolic blood pressures in the 130s however on occasion there were a few instances where systolic blood pressure was 150-160. Per log diary, this was also associated with days of higher sodium intake.  Systolic BPs in the last 2-3 weeks have been stable in the 130s.  From a symptom standpoint, he  denies chest pain.  No dyspnea.  He also notes good exercise tolerance.  No limitations with physical activity.  No exertional symptoms. No required use of SL NTG.   Current Meds  Medication Sig  . acetaminophen (TYLENOL) 500 MG tablet Take 500 mg by mouth every 6 (six) hours as needed for moderate pain.  Marland Kitchen allopurinol (ZYLOPRIM) 300 MG tablet Take 1 tablet (300 mg total) daily by mouth.  Marland Kitchen aspirin 81 MG tablet Take 81 mg by mouth daily.    . clotrimazole-betamethasone (LOTRISONE) cream Apply 1 application topically daily.  Marland Kitchen desonide (DESOWEN) 0.05 % cream Apply 1 application topically daily. Reported on 02/27/2016  . diphenhydrAMINE (BENADRYL) 25 MG tablet Take 50 mg by mouth 3 (three) times daily as needed for itching or allergies.   Marland Kitchen EPINEPHrine 0.3 mg/0.3 mL IJ SOAJ injection AS DIRECTED AS NEEDED  . fluticasone (FLONASE) 50 MCG/ACT nasal spray Place 1 spray into both nostrils daily.  Marland Kitchen gabapentin (NEURONTIN) 300 MG capsule Take 600 mg by mouth 2 (two) times daily.   Marland Kitchen loratadine (CLARITIN) 10 MG tablet Take 20 mg by mouth daily. As needed for allergy  . losartan (COZAAR) 50 MG tablet Take 1 tablet twice daily  . metoprolol tartrate (LOPRESSOR) 25 MG tablet Take 1 tablet by mouth two  times daily  . nitroGLYCERIN (NITROSTAT) 0.4 MG SL tablet Place 1 tablet (0.4 mg total) under the tongue every 5 (five) minutes as needed for chest pain.  Marland Kitchen  pantoprazole (PROTONIX) 40 MG tablet Take 1 tablet (40 mg total) by mouth daily.  Marland Kitchen PREVIDENT 5000 BOOSTER PLUS 1.1 % PSTE Take by mouth as directed. APPLY TO TEETH AT BEDTIME  . rosuvastatin (CRESTOR) 20 MG tablet Take 1 tablet (20 mg total) by mouth daily.  . sildenafil (VIAGRA) 100 MG tablet Take 100 mg by mouth daily as needed for erectile dysfunction.  . traMADol (ULTRAM) 50 MG tablet Take 1 tablet (50 mg total) by mouth 3 (three) times daily as needed. for pain   No Known Allergies Past Medical History:  Diagnosis Date  . Allergic rhinitis     allergy shots via [place]  . Allergy   . Anxiety   . 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 CAD, multiple vessel    a. CP/near-syncope 11/2014 s/p DES to MLAD, occluded RCA after takeoff of RV marginal with collaterals treated medically, otherwise mild nonobstructive disease. EF 55%;  b. 07/2015 Lexiscan CL: EF 56%, no ischemia/infarct.  Remains on DAPT as of 03/2017 cards f/u.  Marland Kitchen Cancer ([place])    skin  . Chronic low back pain   . DDD (degenerative disc disease), lumbar   . GERD (gastroesophageal reflux disease)    PPI bid needed to control sx's  . Gout   . Hearing loss   . History  of basal cell carcinoma   . History of adenomatous polyp of colon   . Hypercholesteremia   . Hypertension    Hx labile/uncontrolled.  Improved s/p visit to HTN clinic 2019.  . Iron deficiency anemia 2017   Colonoscopy, EGD, and capsule endoscopy w/out obvious cause/source found.  Oral iron helping as of 03/2016.  . Leg pain 03/06/2016  . 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 Peripheral vascular disease ([place])   . Pleural thickening    chronic  . Prediabetes    HbA1c 6 % 09/2015.   Family History  Problem Relation Age of Onset  . Lung cancer Father   . Heart disease Father   . Breast cancer Mother   . Congestive Heart Failure Mother   . Diabetes Mother   . Breast cancer Sister   . Heart disease Brother   . COPD Brother   . Colon cancer Neg Hx   . Esophageal cancer Neg Hx   . Pancreatic cancer Neg Hx   . Rectal cancer Neg Hx   . Stomach cancer Neg Hx   . Prostate cancer Neg Hx    Past Surgical History:  Procedure Laterality Date  . back injection  oct 2011, 07/2010, 12/2012   no surgery  . Capsule endoscopy  02/2016   mild duodenitis, o/w NEG  . CARDIOVASCULAR STRESS TEST  05/2009; 07/2015  . carotid dopplers  11/2014   Bilateral - 1% to 9% ICA stenosis. Vertebral artery flow is  . COLONOSCOPY  08/2008; 02/2016; 02/2017   2017: 12 polyps (adenomatous).  02/2017 repeat TCS--multiple polyps:  recall 3 yrs.  . CORONARY ANGIOPLASTY WITH STENT PLACEMENT  12/04/2014   4.0 x 12 mm Synergy stent to the mid LAD  . ESOPHAGOGASTRODUODENOSCOPY  02/2016   Gastritis.  NEG h pylori.    . LE arterial vascular study  02/2016   Per Dr. Radford Pax, no evidence of peripheral vascular disease  . LEFT HEART CATHETERIZATION WITH CORONARY ANGIOGRAM N/A 12/04/2014   Procedure: LEFT HEART CATHETERIZATION WITH CORONARY ANGIOGRAM;  Surgeon: Jolaine Artist, MD; LAD 99% then aneurysmal, OM1 40%, CFX 50%, RCA 40%, 50% then occluded  . ROTATOR  CUFF REPAIR Left 09/2013   left  . SHOULDER INJECTION  feb 2014  . TRANSTHORACIC ECHOCARDIOGRAM  11/2014   EF 50-55%, some wall motion abnormalities noted, grade I diast dysfxn   Social History   Socioeconomic History  . Marital status: Married    Spouse name: Vaughan Basta  . Number of children: 3  . Years of education: Not on file  . Highest education level: Not on file  Occupational History  . Occupation: retired  Scientific laboratory technician  . Financial resource strain: Not on file  . Food insecurity:    Worry: Not on file    Inability: Not on file  . Transportation needs:    Medical: Not on file    Non-medical: Not on file  Tobacco Use  . Smoking status: Former Smoker    Packs/day: 2.00    Years: 35.00    Pack years: 70.00    Types: Cigarettes    Last attempt to quit: 09/28/1998    Years since quitting: 19.2  . Smokeless tobacco: Former Systems developer    Types: Snuff    Quit date: 02/09/1986  . Tobacco comment: as a teenager  Substance and Sexual Activity  . Alcohol use: No    Alcohol/week: 0.0 oz    Comment: quit 1994  . Drug use: No  . Sexual activity: Not on file  Lifestyle  . Physical activity:    Days per week: Not on file    Minutes per session: Not on file  . Stress: Not on file  Relationships  . Social connections:    Talks on phone: Not on file    Gets together: Not on file    Attends religious service: Not on file    Active member of club or organization: Not  on file    Attends meetings of clubs or organizations: Not on file    Relationship status: Not on file  . Intimate partner violence:    Fear of current or ex partner: Not on file    Emotionally abused: Not on file    Physically abused: Not on file    Forced sexual activity: Not on file  Other Topics Concern  . Not on file  Social History Narrative   Married, 3 children.   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   Former smoker: quit approx around [place].  80 pack-yr hx.   Alcohol-none, used to drink a lot of beer on weekends.  No drugs.   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   Diet: fair     Review of Systems: General: negative for chills, fever, night sweats or weight changes.  Cardiovascular: negative for chest pain, dyspnea on exertion, edema, orthopnea, palpitations, paroxysmal nocturnal dyspnea or shortness of breath Dermatological: negative for rash Respiratory: negative for cough or wheezing Urologic: negative for hematuria Abdominal: negative for nausea, vomiting, diarrhea, bright red blood per rectum, melena, or hematemesis Neurologic: negative for visual changes, syncope, or dizziness All other systems reviewed and are otherwise negative except as noted above.   Physical Exam:  Blood pressure 128/68, pulse 67, height 6\' 2"  (1.88 m), weight 219 lb 6.4 oz (99.5 kg), SpO2 95 %.  General appearance: alert, cooperative and no distress Neck: no carotid bruit and no JVD Lungs: clear to auscultation bilaterally Heart: regular rate and rhythm, S1, S2 normal, no murmur, click, rub or gallop Extremities: extremities normal, atraumatic, no cyanosis or  edema Pulses: 2+ and symmetric Skin: Skin color, texture, turgor normal. No rashes or lesions Neurologic: Grossly normal  EKG not performed -- personally reviewed   ASSESSMENT AND PLAN:   1. HTN: controlled on current regimen. Continue  metoprolol and losartan. We discussed low sodium diets. He will continue to monitor BP at home.   2. CAD: stable w/o angina.  Continue current regimen, which includes ASA, statin and BB. No longer on Plavix given h/o severe epistaxis (no recurrences).   3. Lipids: good control. Lipid panel 09/2017 showed LDL at 45 mg/dL.   Follow-Up: keep plans to f/u with Dr. Radford Pax in June.   Brittainy Ladoris Gene, MHS [facility] 12/23/2017 12:16 PM

## 2017-12-23 NOTE — Patient Instructions (Addendum)
Medication Instructions:  Your physician recommends that you continue on your current medications as directed. Please refer to the Current Medication list given to you today.  Labwork: NONE  Testing/Procedures: NONE  Follow-Up: Your physician wants you to follow-up in: 3 months with Dr. Radford Pax.   If you need a refill on your cardiac medications before your next appointment, please call your pharmacy.

## 2018-01-05 DIAGNOSIS — J3089 Other allergic rhinitis: Secondary | ICD-10-CM | POA: Diagnosis not present

## 2018-01-05 DIAGNOSIS — J301 Allergic rhinitis due to pollen: Secondary | ICD-10-CM | POA: Diagnosis not present

## 2018-01-07 DIAGNOSIS — J301 Allergic rhinitis due to pollen: Secondary | ICD-10-CM | POA: Diagnosis not present

## 2018-01-07 DIAGNOSIS — J3089 Other allergic rhinitis: Secondary | ICD-10-CM | POA: Diagnosis not present

## 2018-01-08 ENCOUNTER — Other Ambulatory Visit: Payer: Self-pay | Admitting: Family Medicine

## 2018-01-18 DIAGNOSIS — J3089 Other allergic rhinitis: Secondary | ICD-10-CM | POA: Diagnosis not present

## 2018-01-18 DIAGNOSIS — J301 Allergic rhinitis due to pollen: Secondary | ICD-10-CM | POA: Diagnosis not present

## 2018-01-21 DIAGNOSIS — J3089 Other allergic rhinitis: Secondary | ICD-10-CM | POA: Diagnosis not present

## 2018-01-21 DIAGNOSIS — J301 Allergic rhinitis due to pollen: Secondary | ICD-10-CM | POA: Diagnosis not present

## 2018-01-25 DIAGNOSIS — J301 Allergic rhinitis due to pollen: Secondary | ICD-10-CM | POA: Diagnosis not present

## 2018-01-25 DIAGNOSIS — J3089 Other allergic rhinitis: Secondary | ICD-10-CM | POA: Diagnosis not present

## 2018-01-26 ENCOUNTER — Ambulatory Visit: Payer: Medicare HMO | Admitting: Podiatry

## 2018-01-26 ENCOUNTER — Encounter: Payer: Self-pay | Admitting: Podiatry

## 2018-01-26 DIAGNOSIS — M79676 Pain in unspecified toe(s): Secondary | ICD-10-CM | POA: Diagnosis not present

## 2018-01-26 DIAGNOSIS — D689 Coagulation defect, unspecified: Secondary | ICD-10-CM | POA: Diagnosis not present

## 2018-01-26 DIAGNOSIS — B351 Tinea unguium: Secondary | ICD-10-CM | POA: Diagnosis not present

## 2018-01-26 NOTE — Progress Notes (Signed)
Patient ID: Brandon Haynes, male   DOB: 05/28/1941, 77 y.o.   MRN: [id number] Complaint:  Visit Type: Patient returns to my office for continued preventative foot care services. Complaint: Patient states" my nails have grown long and thick and become painful to walk and wear shoes" . The patient presents for preventative foot care services. No changes to ROS.    Podiatric Exam: Vascular: dorsalis pedis and posterior tibial pulses are palpable bilateral. Capillary return is immediate. Temperature gradient is WNL. Skin turgor WNL  Sensorium: Normal Semmes Weinstein monofilament test. Normal tactile sensation bilaterally. Nail Exam: Pt has thick disfigured discolored nails with subungual debris noted bilateral entire nail hallux through fifth toenails Ulcer Exam: There is no evidence of ulcer or pre-ulcerative changes or infection. Orthopedic Exam: Muscle tone and strength are WNL. No limitations in general ROM. No crepitus or effusions noted. Foot type and digits show no abnormalities. Bony prominences are unremarkable.Severe arthritis 1st MPJ B/L and hammer toe second right foot. Skin: No Porokeratosis. No infection or ulcers      Onychomycosis, , Pain in right toe, pain in left toes  Treatment & Plan Procedures and Treatment: Consent by patient was obtained for treatment procedures. The patient understood the discussion of treatment and procedures well. All questions were answered thoroughly reviewed. Debridement of mycotic and hypertrophic toenails, 1 through 5 bilateral and clearing of subungual debris. No ulceration, no infection noted.  Return Visit-Office Procedure: Patient instructed to return to the office for a follow up visit 3 months for continued evaluation and treatment.    Dallon Dacosta DPM 

## 2018-01-28 DIAGNOSIS — J3089 Other allergic rhinitis: Secondary | ICD-10-CM | POA: Diagnosis not present

## 2018-01-28 DIAGNOSIS — J3081 Allergic rhinitis due to animal (cat) (dog) hair and dander: Secondary | ICD-10-CM | POA: Diagnosis not present

## 2018-01-28 DIAGNOSIS — J301 Allergic rhinitis due to pollen: Secondary | ICD-10-CM | POA: Diagnosis not present

## 2018-02-01 ENCOUNTER — Other Ambulatory Visit: Payer: Self-pay | Admitting: Family Medicine

## 2018-02-01 MED ORDER — ALLOPURINOL 300 MG PO TABS: 300.0000 mg | ORAL_TABLET | Freq: Every day | ORAL | 0 refills | Status: DC

## 2018-02-01 NOTE — Telephone Encounter (Signed)
Copied from [place] [id number]. Topic: Quick Communication - Rx Refill/Question >> Feb 01, 2018  8:35 AM Synthia Innocent wrote: Medication: allopurinol (ZYLOPRIM) 300 MG tablet  Has the patient contacted their pharmacy? Yes.  New Pharmacy (Agent: If no, request that the patient contact the pharmacy for the refill.) Preferred Pharmacy (with phone number or street name): [facility] Agent: Please be advised that RX refills may take up to 3 business days. We ask that you follow-up with your pharmacy.

## 2018-02-02 DIAGNOSIS — J301 Allergic rhinitis due to pollen: Secondary | ICD-10-CM | POA: Diagnosis not present

## 2018-02-04 DIAGNOSIS — J301 Allergic rhinitis due to pollen: Secondary | ICD-10-CM | POA: Diagnosis not present

## 2018-02-08 DIAGNOSIS — J301 Allergic rhinitis due to pollen: Secondary | ICD-10-CM | POA: Diagnosis not present

## 2018-02-08 DIAGNOSIS — L821 Other seborrheic keratosis: Secondary | ICD-10-CM | POA: Diagnosis not present

## 2018-02-08 DIAGNOSIS — D229 Melanocytic nevi, unspecified: Secondary | ICD-10-CM | POA: Diagnosis not present

## 2018-02-08 DIAGNOSIS — J3089 Other allergic rhinitis: Secondary | ICD-10-CM | POA: Diagnosis not present

## 2018-02-22 DIAGNOSIS — J301 Allergic rhinitis due to pollen: Secondary | ICD-10-CM | POA: Diagnosis not present

## 2018-02-22 DIAGNOSIS — J3089 Other allergic rhinitis: Secondary | ICD-10-CM | POA: Diagnosis not present

## 2018-02-24 DIAGNOSIS — M48061 Spinal stenosis, lumbar region without neurogenic claudication: Secondary | ICD-10-CM | POA: Diagnosis not present

## 2018-02-24 DIAGNOSIS — M5136 Other intervertebral disc degeneration, lumbar region: Secondary | ICD-10-CM | POA: Diagnosis not present

## 2018-02-24 DIAGNOSIS — M4726 Other spondylosis with radiculopathy, lumbar region: Secondary | ICD-10-CM | POA: Diagnosis not present

## 2018-03-09 DIAGNOSIS — J301 Allergic rhinitis due to pollen: Secondary | ICD-10-CM | POA: Diagnosis not present

## 2018-03-09 DIAGNOSIS — J3089 Other allergic rhinitis: Secondary | ICD-10-CM | POA: Diagnosis not present

## 2018-03-10 ENCOUNTER — Other Ambulatory Visit: Payer: Self-pay | Admitting: Family Medicine

## 2018-03-10 ENCOUNTER — Other Ambulatory Visit: Payer: Self-pay

## 2018-03-10 MED ORDER — LOSARTAN POTASSIUM 50 MG PO TABS: ORAL_TABLET | ORAL | 3 refills | Status: DC

## 2018-03-10 NOTE — Telephone Encounter (Signed)
Copied from [place] [phone]. Topic: Quick Communication - See Telephone Encounter >> Mar 10, 2018  3:54 PM Ivar Drape wrote: CRM for notification. See Telephone encounter for: 03/10/18. Patient would like a refill on his traMADol (ULTRAM) 50 MG tablet medication and sent to [facility] [place], [place].

## 2018-03-11 MED ORDER — TRAMADOL HCL 50 MG PO TABS: 50.0000 mg | ORAL_TABLET | Freq: Three times a day (TID) | ORAL | 0 refills | Status: DC | PRN

## 2018-03-11 NOTE — Telephone Encounter (Signed)
RF request for tramadol LOV: 10/22/17 Next ov: 04/21/18 Last written: 06/25/17 #270 w/ 0RF  Please advise. Thanks.

## 2018-03-11 NOTE — Telephone Encounter (Signed)
Tramadol refill Last Refill:9/28/ #270 Last OV: 10/22/17 PCP: Dr. Anitra Lauth [facility] [place]

## 2018-03-11 NOTE — Telephone Encounter (Signed)
Will limit this rx to #90 --will f/u chronic pain at his next scheduled visit 04/21/18. (eRx'd).

## 2018-03-17 ENCOUNTER — Encounter: Payer: Self-pay | Admitting: Cardiology

## 2018-03-17 ENCOUNTER — Ambulatory Visit: Payer: Medicare HMO | Admitting: Cardiology

## 2018-03-17 VITALS — BP 120/68 | HR 73 | Ht 74.0 in | Wt 207.8 lb

## 2018-03-17 DIAGNOSIS — I2510 Atherosclerotic heart disease of native coronary artery without angina pectoris: Secondary | ICD-10-CM

## 2018-03-17 DIAGNOSIS — E7800 Pure hypercholesterolemia, unspecified: Secondary | ICD-10-CM | POA: Diagnosis not present

## 2018-03-17 DIAGNOSIS — I10 Essential (primary) hypertension: Secondary | ICD-10-CM

## 2018-03-17 NOTE — Patient Instructions (Signed)
Medication Instructions:  Your physician recommends that you continue on your current medications as directed. Please refer to the Current Medication list given to you today.    Labwork: None ordered  Testing/Procedures: None ordered  Follow-Up: Your physician wants you to follow-up in: 1 YEAR WITH DR. TURNER    You will receive a reminder letter in the mail two months in advance. If you don't receive a letter, please call our office to schedule the follow-up appointment.   Any Other Special Instructions Will Be Listed Below (If Applicable).     If you need a refill on your cardiac medications before your next appointment, please call your pharmacy.  . 

## 2018-03-17 NOTE — Progress Notes (Signed)
Cardiology Office Note:    Date:  03/17/2018   ID:  Brandon Haynes, Brandon Haynes 1941-03-13, MRN [id number]  PCP:  Brandon Sou, MD  Cardiologist:  No primary care provider on file.    Referring MD: Brandon Sou, MD   Chief Complaint  Patient presents with  . Coronary Artery Disease  . Hypertension  . Hyperlipidemia    History of Present Illness:    Brandon Haynes is a 77 y.o. male with a hx of 2 vesselASCAD with a 99% LAD and an occluded RCA. The RCA was felt well collateralized and heunderwent PCI of the LAD. LV function was 55% at time of cath. Echocardiogram revealed akinesis of the basal-mid inferoseptal myocardium and mid-apical anteroseptal myocardium. His last nuclear stress test showed no ischemia. His angina has presented as CP and hypotension in the past.    He is here today for followup and is doing well.  He denies any chest pain or pressure, SOB, DOE, PND, orthopnea, LE edema, dizziness, palpitations or syncope. He is compliant with his meds and is tolerating meds with no SE.     Past Medical History:  Diagnosis Date  . Allergic rhinitis    allergy shots via [place]  . Allergy   . Anxiety   . 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 CAD, multiple vessel    a. CP/near-syncope 11/2014 s/p DES to MLAD, occluded RCA after takeoff of RV marginal with collaterals treated medically, otherwise mild nonobstructive disease. EF 55%;  b. 07/2015 Lexiscan CL: EF 56%, no ischemia/infarct.  Remains on DAPT as of 03/2017 cards f/u.  Marland Kitchen Cancer ([place])    skin  . Chronic low back pain   . DDD (degenerative disc disease), lumbar   . GERD (gastroesophageal reflux disease)    PPI bid needed to control sx's  . Gout   . Hearing loss   . History  of basal cell carcinoma   . History of adenomatous polyp of colon   . Hypercholesteremia   . Hypertension    Hx labile/uncontrolled.  Improved s/p visit to HTN clinic 2019.  . Iron deficiency anemia 2017   Colonoscopy, EGD, and capsule  endoscopy w/out obvious cause/source found.  Oral iron helping as of 03/2016.  . Leg pain 03/06/2016  . 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 Peripheral vascular disease ([place])   . Pleural thickening    chronic  . Prediabetes    HbA1c 6 % 09/2015.    Past Surgical History:  Procedure Laterality Date  . back injection  oct 2011, 07/2010, 12/2012   no surgery  . Capsule endoscopy  02/2016   mild duodenitis, o/w NEG  . CARDIOVASCULAR STRESS TEST  05/2009; 07/2015  . carotid dopplers  11/2014   Bilateral - 1% to 9% ICA stenosis. Vertebral artery flow is  . COLONOSCOPY  08/2008; 02/2016; 02/2017   2017: 12 polyps (adenomatous).  02/2017 repeat TCS--multiple polyps: recall 3 yrs.  . CORONARY ANGIOPLASTY WITH STENT PLACEMENT  12/04/2014   4.0 x 12 mm Synergy stent to the mid LAD  . ESOPHAGOGASTRODUODENOSCOPY  02/2016   Gastritis.  NEG h pylori.    . LE arterial vascular study  02/2016   Per Brandon Haynes, no evidence of peripheral vascular disease  . LEFT HEART CATHETERIZATION WITH CORONARY ANGIOGRAM N/A 12/04/2014   Procedure: LEFT HEART CATHETERIZATION WITH CORONARY ANGIOGRAM;  Surgeon: Brandon Artist, MD; LAD 99% then aneurysmal, OM1 40%, CFX 50%, RCA 40%, 50% then occluded  .  ROTATOR CUFF REPAIR Left 09/2013   left  . SHOULDER INJECTION  feb 2014  . TRANSTHORACIC ECHOCARDIOGRAM  11/2014   EF 50-55%, some wall motion abnormalities noted, grade I diast dysfxn    Current Medications: No outpatient medications have been marked as taking for the 03/17/18 encounter (Office Visit) with Brandon Margarita, MD.     Allergies:   Patient has no known allergies.   Social History   Socioeconomic History  . Marital status: Married    Spouse name: Brandon Haynes  . Number of children: 3  . Years of education: Not on file  . Highest education level: Not on file  Occupational History  . Occupation: retired  Scientific laboratory technician  . Financial resource strain: Not on file  . Food insecurity:    Worry: Not on file     Inability: Not on file  . Transportation needs:    Medical: Not on file    Non-medical: Not on file  Tobacco Use  . Smoking status: Former Smoker    Packs/day: 2.00    Years: 35.00    Pack years: 70.00    Types: Cigarettes    Last attempt to quit: 09/28/1998    Years since quitting: 19.4  . Smokeless tobacco: Former Systems developer    Types: Snuff    Quit date: 02/09/1986  . Tobacco comment: as a teenager  Substance and Sexual Activity  . Alcohol use: No    Alcohol/week: 0.0 oz    Comment: quit 1994  . Drug use: No  . Sexual activity: Not on file  Lifestyle  . Physical activity:    Days per week: Not on file    Minutes per session: Not on file  . Stress: Not on file  Relationships  . Social connections:    Talks on phone: Not on file    Gets together: Not on file    Attends religious service: Not on file    Active member of club or organization: Not on file    Attends meetings of clubs or organizations: Not on file    Relationship status: Not on file  Other Topics Concern  . Not on file  Social History Narrative   Married, 3 children.   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   Orig from [place], lived [place] same farm all his life.   Former smoker: quit approx around [place].  80 pack-yr hx.   Alcohol-none, used to drink a lot of beer on weekends.  No drugs.   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   Diet: fair     Family History: The patient's family history includes Breast cancer in his mother and sister; COPD in his brother; Congestive Heart Failure in his mother; Diabetes in his mother; Heart disease in his brother and father; Lung cancer in his father. There is no history of Colon cancer, Esophageal cancer, Pancreatic cancer, Rectal cancer, Stomach cancer, or Prostate cancer.  ROS:   Please see the history of present illness.    ROS  All other systems reviewed and negative.   EKGs/Labs/Other Studies Reviewed:    The following studies were  reviewed today: none  EKG:  EKG is not ordered today.    Recent Labs: 09/02/2017: Hemoglobin 14.9; Platelets 190.0 10/06/2017: ALT 15 10/28/2017: BUN 8; Creatinine, Ser 1.02; Potassium 4.0; Sodium 143   Recent Lipid Panel    Component Value Date/Time   CHOL 125 10/06/2017 0854   TRIG 123 10/06/2017 0854  HDL 55 10/06/2017 0854   CHOLHDL 2.3 10/06/2017 0854   CHOLHDL 2.5 10/20/2016 1017   VLDL 24 10/20/2016 1017   LDLCALC 45 10/06/2017 0854    Physical Exam:    VS:  There were no vitals taken for this visit.    Wt Readings from Last 3 Encounters:  12/23/17 219 lb 6.4 oz (99.5 kg)  10/22/17 215 lb (97.5 kg)  10/22/17 215 lb 6.4 oz (97.7 kg)     GEN:  Well nourished, well developed in no acute distress HEENT: Normal NECK: No JVD; No carotid bruits LYMPHATICS: No lymphadenopathy CARDIAC: RRR, no murmurs, rubs, gallops RESPIRATORY:  Clear to auscultation without rales, wheezing or rhonchi  ABDOMEN: Soft, non-tender, non-distended MUSCULOSKELETAL:  No edema; No deformity  SKIN: Warm and dry NEUROLOGIC:  Alert and oriented x 3 PSYCHIATRIC:  Normal affect   ASSESSMENT:    1. Coronary artery disease involving native coronary artery of native heart without angina pectoris   2. Essential hypertension   3. HYPERCHOLESTEROLEMIA    PLAN:    In order of problems listed above:  1.  ASCAD - cath with 2 vesselCAD with a 99% LAD and an occluded RCA. The RCA was felt well collateralized and heunderwent PCI of the LAD.  He has not had any further anginal symptoms.  He will continue on Lopressor 25 mg twice daily, aspirin 81 mg daily and statin.  2.  HTN -  BP is well controlled on exam today.  He will continue on Losartan 50mg  daily and Lopressor 25 mg twice daily.  Creatinine was normal at 1.02 on 10/28/2017.  3.  Hyperlipidemia - LDL goal < 70.  He will continue on crestor 20mg  daily.  His LDL was at goal at 45 on 10/06/2017 and ALT was normal at that time as well.   Medication  Adjustments/Labs and Tests Ordered: Current medicines are reviewed at length with the patient today.  Concerns regarding medicines are outlined above.  No orders of the defined types were placed in this encounter.  No orders of the defined types were placed in this encounter.   Signed, Fransico Him, MD  03/17/2018 9:54 AM    [place]

## 2018-03-21 DIAGNOSIS — J301 Allergic rhinitis due to pollen: Secondary | ICD-10-CM | POA: Diagnosis not present

## 2018-03-21 DIAGNOSIS — J3089 Other allergic rhinitis: Secondary | ICD-10-CM | POA: Diagnosis not present

## 2018-03-23 ENCOUNTER — Other Ambulatory Visit: Payer: Self-pay | Admitting: Cardiology

## 2018-04-05 DIAGNOSIS — J301 Allergic rhinitis due to pollen: Secondary | ICD-10-CM | POA: Diagnosis not present

## 2018-04-05 DIAGNOSIS — J3089 Other allergic rhinitis: Secondary | ICD-10-CM | POA: Diagnosis not present

## 2018-04-20 DIAGNOSIS — J3089 Other allergic rhinitis: Secondary | ICD-10-CM | POA: Diagnosis not present

## 2018-04-20 DIAGNOSIS — J301 Allergic rhinitis due to pollen: Secondary | ICD-10-CM | POA: Diagnosis not present

## 2018-04-21 ENCOUNTER — Encounter: Payer: Self-pay | Admitting: Family Medicine

## 2018-04-21 ENCOUNTER — Ambulatory Visit: Payer: Medicare HMO | Admitting: Family Medicine

## 2018-04-21 VITALS — BP 93/54 | HR 49 | Temp 98.3°F | Resp 16 | Ht 74.0 in | Wt 206.1 lb

## 2018-04-21 DIAGNOSIS — M545 Low back pain, unspecified: Secondary | ICD-10-CM

## 2018-04-21 DIAGNOSIS — I10 Essential (primary) hypertension: Secondary | ICD-10-CM | POA: Diagnosis not present

## 2018-04-21 DIAGNOSIS — G894 Chronic pain syndrome: Secondary | ICD-10-CM

## 2018-04-21 DIAGNOSIS — M5136 Other intervertebral disc degeneration, lumbar region: Secondary | ICD-10-CM

## 2018-04-21 DIAGNOSIS — E7800 Pure hypercholesterolemia, unspecified: Secondary | ICD-10-CM | POA: Diagnosis not present

## 2018-04-21 DIAGNOSIS — G8929 Other chronic pain: Secondary | ICD-10-CM | POA: Diagnosis not present

## 2018-04-21 DIAGNOSIS — E663 Overweight: Secondary | ICD-10-CM | POA: Diagnosis not present

## 2018-04-21 MED ORDER — TRAMADOL HCL 50 MG PO TABS: 50.0000 mg | ORAL_TABLET | Freq: Three times a day (TID) | ORAL | 2 refills | Status: DC | PRN

## 2018-04-21 NOTE — Progress Notes (Signed)
OFFICE VISIT  04/21/2018   CC:  Chief Complaint  Patient presents with  . Follow-up    RCI, pt is not fasting.    HPI:    Patient is a 77 y.o. Caucasian male who presents for f/u chronic pain syndrome, HTN, HLD.  Indication for chronic opioid: chronic low back pain (DDD) and arthritis of multiple joints, particularly feet and knees.  Opioids r'xd to maximize functioning and quality of life.  Pt has gotten periodic ESI but has no hx of back surgery. Medication and dose: tramadol 50mg , 1 tab tid prn # pills per month: 90 Last UDS date: 07/22/17 Opioid Treatment Agreement signed (Y/N): Y, 07/22/17 Opioid Treatment Agreement last reviewed with patient: today [place] reviewed this encounter (include red flags): today, no red flags.  Pain increased in low back lately b/c insurance limits him to 3 gabapentin per day now, and he was doing better on 4 per day.  He is going to try to f/u with his neurosurgeon about this soon. Takes 1 tramadol bid pretty consistently.  This helps his pain significantly. No side effects from the med.  Checks bp at home a couple times per week: usually low 100s, occ high 61Y systolic.  Diastolics in 07P-71, HR 06Y to low 60s.  He intermittently gets mild orthostatic dizziness, several times per week.  He has not fallen. He pauses before walking to let it resolve.  October 2018 the same scenario was occurring and I cut his losartan back to one 50mg  tab once a day and then his bp went up to 170s-180s over 80s-90s.  His cardiologist then increased his losartan back to 50mg  bid. He occ skips a dose of losartan if he feels "sluggish".  Also occ skips a dose of metoprolol.  Gardens/does lawn care w/out sx's.    ROS: no CP, no SOB, no wheezing, no cough, no dizziness, no HAs, no rashes, no melena/hematochezia.  No polyuria or polydipsia.  No myalgias or arthralgias.   Past Medical History:  Diagnosis Date  . Allergic rhinitis    allergy shots via [place]  . Allergy    . Anxiety   . 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 CAD, multiple vessel    a. CP/near-syncope 11/2014 s/p DES to MLAD, occluded RCA after takeoff of RV marginal with collaterals treated medically, otherwise mild nonobstructive disease. EF 55%;  b. 07/2015 Lexiscan CL: EF 56%, no ischemia/infarct.  Remains on DAPT as of 03/2017 cards f/u.  Marland Kitchen Cancer ([place])    skin  . Chronic low back pain   . DDD (degenerative disc disease), lumbar   . GERD (gastroesophageal reflux disease)    PPI bid needed to control sx's  . Gout   . Hearing loss   . History  of basal cell carcinoma   . History of adenomatous polyp of colon   . Hypercholesteremia   . Hypertension    Hx labile/uncontrolled.  Improved s/p visit to HTN clinic 2019.  . Iron deficiency anemia 2017   Colonoscopy, EGD, and capsule endoscopy w/out obvious cause/source found.  Oral iron helping as of 03/2016.  . Leg pain 03/06/2016  . 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 Peripheral vascular disease ([place])   . Pleural thickening    chronic  . Prediabetes    HbA1c 6 % 09/2015.    Past Surgical History:  Procedure Laterality Date  . back injection  oct 2011, 07/2010, 12/2012   no surgery  . Capsule endoscopy  02/2016  mild duodenitis, o/w NEG  . CARDIOVASCULAR STRESS TEST  05/2009; 07/2015  . carotid dopplers  11/2014   Bilateral - 1% to 9% ICA stenosis. Vertebral artery flow is  . COLONOSCOPY  08/2008; 02/2016; 02/2017   2017: 12 polyps (adenomatous).  02/2017 repeat TCS--multiple polyps: recall 3 yrs.  . CORONARY ANGIOPLASTY WITH STENT PLACEMENT  12/04/2014   4.0 x 12 mm Synergy stent to the mid LAD  . ESOPHAGOGASTRODUODENOSCOPY  02/2016   Gastritis.  NEG h pylori.    . LE arterial vascular study  02/2016   Per Dr. Radford Pax, no evidence of peripheral vascular disease  . LEFT HEART CATHETERIZATION WITH CORONARY ANGIOGRAM N/A 12/04/2014   Procedure: LEFT HEART CATHETERIZATION WITH CORONARY ANGIOGRAM;  Surgeon: Jolaine Artist, MD;  LAD 99% then aneurysmal, OM1 40%, CFX 50%, RCA 40%, 50% then occluded  . ROTATOR CUFF REPAIR Left 09/2013   left  . SHOULDER INJECTION  feb 2014  . TRANSTHORACIC ECHOCARDIOGRAM  11/2014   EF 50-55%, some wall motion abnormalities noted, grade I diast dysfxn    Outpatient Medications Prior to Visit  Medication Sig Dispense Refill  . acetaminophen (TYLENOL) 500 MG tablet Take 500 mg by mouth every 6 (six) hours as needed for moderate pain.    Marland Kitchen allopurinol (ZYLOPRIM) 300 MG tablet Take 1 tablet (300 mg total) by mouth daily. 90 tablet 0  . aspirin 81 MG tablet Take 81 mg by mouth daily.      Marland Kitchen desonide (DESOWEN) 0.05 % cream Apply 1 application topically daily. Reported on 02/27/2016  1  . diphenhydrAMINE (BENADRYL) 25 MG tablet Take 50 mg by mouth 3 (three) times daily as needed for itching or allergies.     Marland Kitchen EPINEPHrine 0.3 mg/0.3 mL IJ SOAJ injection AS DIRECTED AS NEEDED    . fluticasone (FLONASE) 50 MCG/ACT nasal spray Place 1 spray into both nostrils daily.    Marland Kitchen gabapentin (NEURONTIN) 300 MG capsule Take 600 mg by mouth 2 (two) times daily.     Marland Kitchen loratadine (CLARITIN) 10 MG tablet Take 20 mg by mouth daily. As needed for allergy    . losartan (COZAAR) 50 MG tablet Take 1 tablet twice daily 180 tablet 3  . metoprolol tartrate (LOPRESSOR) 25 MG tablet TAKE 1 TABLET BY MOUTH TWO TIMES DAILY 180 tablet 3  . nitroGLYCERIN (NITROSTAT) 0.4 MG SL tablet Place 1 tablet (0.4 mg total) under the tongue every 5 (five) minutes as needed for chest pain. 25 tablet 3  . pantoprazole (PROTONIX) 40 MG tablet Take 1 tablet (40 mg total) by mouth daily. 90 tablet 3  . PREVIDENT 5000 BOOSTER PLUS 1.1 % PSTE Take by mouth as directed. APPLY TO TEETH AT BEDTIME  1  . rosuvastatin (CRESTOR) 20 MG tablet TAKE 1 TABLET BY MOUTH EVERY DAY 90 tablet 1  . sildenafil (VIAGRA) 100 MG tablet Take 100 mg by mouth daily as needed for erectile dysfunction.    . traMADol (ULTRAM) 50 MG tablet Take 1 tablet (50 mg total) by  mouth 3 (three) times daily as needed. for pain 90 tablet 0   No facility-administered medications prior to visit.     No Known Allergies  ROS As per HPI  PE: Blood pressure (!) 93/54, pulse (!) 49, temperature 98.3 F (36.8 C), temperature source Oral, resp. rate 16, height 6\' 2"  (1.88 m), weight 206 lb 2 oz (93.5 kg), SpO2 97 %. Gen: Alert, well appearing.  Patient is oriented to person, place, time, and situation.  AFFECT: pleasant, lucid thought and speech. CV: RRR, no m/r/g.   LUNGS: CTA bilat, nonlabored resps, good aeration in all lung fields. EXT: trace pitting edema bilat LLs.  LABS:    Chemistry      Component Value Date/Time   NA 143 10/28/2017 1129   K 4.0 10/28/2017 1129   CL 102 10/28/2017 1129   CO2 26 10/28/2017 1129   BUN 8 10/28/2017 1129   CREATININE 1.02 10/28/2017 1129   CREATININE 1.08 10/20/2016 1017      Component Value Date/Time   CALCIUM 9.4 10/28/2017 1129   ALKPHOS 57 10/06/2017 0854   AST 19 10/06/2017 0854   ALT 15 10/06/2017 0854   BILITOT 0.8 10/06/2017 0854     Lab Results  Component Value Date   CHOL 125 10/06/2017   HDL 55 10/06/2017   LDLCALC 45 10/06/2017   TRIG 123 10/06/2017   CHOLHDL 2.3 10/06/2017     IMPRESSION AND PLAN:  1) HTN: bp actually remaining on the low-normal range, occ low and associated with mild orthostatic dizziness. Unfortunately, past attempt to cut back on losartan led to unacceptable high bp. Continue current meds at current dosing at this time.  Monitor bp at home more frequently, call if persistently 90/50 or less OR if his orthostatic dizziness becomes more frequent or severe. BMET today.  2) Chronic pain syndrome: The current medical regimen is effective;  continue present plan and medications. Sent in eRx for tramadol 50mg , 1 tid prn, #90, RF x 2. Continue gabapentin and continue with plan for f/u with his neurosurgeon to see if he can get the gabapentin dosing/insurance barrier taken care  of.  3) HLD: tolerating statin.  Lipids excellent 09/2017.  Plan repeat [place] 09/2018.  An After Visit Summary was printed and given to the patient.  FOLLOW UP: Return in about 3 months (around 07/22/2018) for chronic pain.  Signed:  Crissie Sickles, MD           04/21/2018

## 2018-04-22 LAB — BASIC METABOLIC PANEL
BUN: 9 mg/dL (ref 7–25)
CHLORIDE: 105 mmol/L (ref 98–110)
CO2: 28 mmol/L (ref 20–32)
CREATININE: 0.95 mg/dL (ref 0.70–1.18)
Calcium: 9.6 mg/dL (ref 8.6–10.3)
GLUCOSE: 88 mg/dL (ref 65–99)
Potassium: 4.3 mmol/L (ref 3.5–5.3)
Sodium: 143 mmol/L (ref 135–146)

## 2018-04-22 LAB — EXTRA LAV TOP TUBE

## 2018-04-27 ENCOUNTER — Other Ambulatory Visit: Payer: Self-pay | Admitting: Family Medicine

## 2018-04-27 NOTE — Telephone Encounter (Signed)
RF request for allopurinol LOV: 04/21/18 Next ov: 07/22/18 Last written: 02/01/18 #90 w/ 8XK  Please advise. Thanks.

## 2018-04-28 DIAGNOSIS — J301 Allergic rhinitis due to pollen: Secondary | ICD-10-CM | POA: Diagnosis not present

## 2018-04-28 DIAGNOSIS — J3089 Other allergic rhinitis: Secondary | ICD-10-CM | POA: Diagnosis not present

## 2018-04-29 ENCOUNTER — Encounter: Payer: Self-pay | Admitting: Podiatry

## 2018-04-29 ENCOUNTER — Ambulatory Visit: Payer: Medicare HMO | Admitting: Podiatry

## 2018-04-29 DIAGNOSIS — D689 Coagulation defect, unspecified: Secondary | ICD-10-CM

## 2018-04-29 DIAGNOSIS — B351 Tinea unguium: Secondary | ICD-10-CM | POA: Diagnosis not present

## 2018-04-29 DIAGNOSIS — M79676 Pain in unspecified toe(s): Secondary | ICD-10-CM | POA: Diagnosis not present

## 2018-04-29 NOTE — Progress Notes (Signed)
Patient ID: Brandon Haynes, male   DOB: December 20, 1940, 77 y.o.   MRN: [id number] Complaint:  Visit Type: Patient returns to my office for continued preventative foot care services. Complaint: Patient states" my nails have grown long and thick and become painful to walk and wear shoes" . The patient presents for preventative foot care services. No changes to ROS.    Podiatric Exam: Vascular: dorsalis pedis and posterior tibial pulses are palpable bilateral. Capillary return is immediate. Temperature gradient is WNL. Skin turgor WNL  Sensorium: Normal Semmes Weinstein monofilament test. Normal tactile sensation bilaterally. Nail Exam: Pt has thick disfigured discolored nails with subungual debris noted bilateral entire nail hallux through fifth toenails Ulcer Exam: There is no evidence of ulcer or pre-ulcerative changes or infection. Orthopedic Exam: Muscle tone and strength are WNL. No limitations in general ROM. No crepitus or effusions noted. Foot type and digits show no abnormalities. Bony prominences are unremarkable.Severe arthritis 1st MPJ B/L and hammer toe second right foot. Skin: No Porokeratosis. No infection or ulcers      Onychomycosis, , Pain in right toe, pain in left toes  Treatment & Plan Procedures and Treatment: Consent by patient was obtained for treatment procedures. The patient understood the discussion of treatment and procedures well. All questions were answered thoroughly reviewed. Debridement of mycotic and hypertrophic toenails, 1 through 5 bilateral and clearing of subungual debris. No ulceration, no infection noted.  Return Visit-Office Procedure: Patient instructed to return to the office for a follow up visit 3 months for continued evaluation and treatment.    Gardiner Barefoot DPM

## 2018-05-04 ENCOUNTER — Other Ambulatory Visit: Payer: Self-pay | Admitting: Cardiology

## 2018-05-04 DIAGNOSIS — J301 Allergic rhinitis due to pollen: Secondary | ICD-10-CM | POA: Diagnosis not present

## 2018-05-04 DIAGNOSIS — J3089 Other allergic rhinitis: Secondary | ICD-10-CM | POA: Diagnosis not present

## 2018-05-04 MED ORDER — NITROGLYCERIN 0.4 MG SL SUBL: 0.4000 mg | SUBLINGUAL_TABLET | SUBLINGUAL | 6 refills | Status: DC | PRN

## 2018-05-04 NOTE — Telephone Encounter (Signed)
Pt's medication was sent to pt's pharmacy as requested. Confirmation received.  °

## 2018-05-10 DIAGNOSIS — J3089 Other allergic rhinitis: Secondary | ICD-10-CM | POA: Diagnosis not present

## 2018-05-10 DIAGNOSIS — J301 Allergic rhinitis due to pollen: Secondary | ICD-10-CM | POA: Diagnosis not present

## 2018-05-20 DIAGNOSIS — M5136 Other intervertebral disc degeneration, lumbar region: Secondary | ICD-10-CM | POA: Diagnosis not present

## 2018-05-20 DIAGNOSIS — M48062 Spinal stenosis, lumbar region with neurogenic claudication: Secondary | ICD-10-CM | POA: Diagnosis not present

## 2018-05-20 DIAGNOSIS — J3089 Other allergic rhinitis: Secondary | ICD-10-CM | POA: Diagnosis not present

## 2018-05-20 DIAGNOSIS — J301 Allergic rhinitis due to pollen: Secondary | ICD-10-CM | POA: Diagnosis not present

## 2018-05-20 DIAGNOSIS — M4726 Other spondylosis with radiculopathy, lumbar region: Secondary | ICD-10-CM | POA: Diagnosis not present

## 2018-05-24 DIAGNOSIS — R69 Illness, unspecified: Secondary | ICD-10-CM | POA: Diagnosis not present

## 2018-05-25 DIAGNOSIS — J3089 Other allergic rhinitis: Secondary | ICD-10-CM | POA: Diagnosis not present

## 2018-05-25 DIAGNOSIS — J301 Allergic rhinitis due to pollen: Secondary | ICD-10-CM | POA: Diagnosis not present

## 2018-05-31 DIAGNOSIS — J3089 Other allergic rhinitis: Secondary | ICD-10-CM | POA: Diagnosis not present

## 2018-05-31 DIAGNOSIS — J301 Allergic rhinitis due to pollen: Secondary | ICD-10-CM | POA: Diagnosis not present

## 2018-06-07 DIAGNOSIS — J301 Allergic rhinitis due to pollen: Secondary | ICD-10-CM | POA: Diagnosis not present

## 2018-06-07 DIAGNOSIS — J3089 Other allergic rhinitis: Secondary | ICD-10-CM | POA: Diagnosis not present

## 2018-06-09 ENCOUNTER — Telehealth: Payer: Self-pay | Admitting: *Deleted

## 2018-06-09 MED ORDER — SILDENAFIL CITRATE 100 MG PO TABS: 100.0000 mg | ORAL_TABLET | Freq: Every day | ORAL | 6 refills | Status: DC | PRN

## 2018-06-09 NOTE — Telephone Encounter (Signed)
Please advise. Thanks.  

## 2018-06-09 NOTE — Telephone Encounter (Signed)
I eRx'd RF of viagra for him. Pls remind him to NOT use viagra if he has had to use any nitroglycerin at all lately.-thx

## 2018-06-09 NOTE — Telephone Encounter (Signed)
Left detailed message on home vm, okay per DPR.  

## 2018-06-09 NOTE — Telephone Encounter (Signed)
Copied from [place] [id number]. Topic: General - Other >> Jun 09, 2018  9:25 AM Carolyn Stare wrote:  Pt call to ask if Dr Anitra Lauth will RX him the below med    sildenafil (VIAGRA) 100 MG tablet  [facility]

## 2018-06-12 DIAGNOSIS — J181 Lobar pneumonia, unspecified organism: Secondary | ICD-10-CM | POA: Diagnosis not present

## 2018-06-12 DIAGNOSIS — R0602 Shortness of breath: Secondary | ICD-10-CM | POA: Diagnosis not present

## 2018-06-16 ENCOUNTER — Encounter: Payer: Self-pay | Admitting: Family Medicine

## 2018-06-16 ENCOUNTER — Ambulatory Visit: Payer: Medicare HMO | Admitting: Family Medicine

## 2018-06-16 VITALS — BP 123/76 | HR 53 | Temp 98.2°F | Resp 16 | Ht 74.0 in | Wt 207.2 lb

## 2018-06-16 DIAGNOSIS — J180 Bronchopneumonia, unspecified organism: Secondary | ICD-10-CM

## 2018-06-16 MED ORDER — PREDNISONE 20 MG PO TABS: ORAL_TABLET | ORAL | 0 refills | Status: DC

## 2018-06-16 NOTE — Progress Notes (Signed)
OFFICE VISIT  06/16/2018   CC:  Chief Complaint  Patient presents with  . Pneumonia    ? seen at minute clinic     HPI:    Patient is a 77 y.o. Caucasian male who presents for f/u pneumonia. Apparently he was dx'd with this at [facility] a few days ago (06/12/18).  He was put on augmentin and azithromycin. I reviewed the [facility] records today.  Onset of resp illness about 12 d/a, nasal congestion/runny nose, cough, pretty fatigued--sx's progressed pretty quick so he went to [place].  No known fevers.   Cough productive of clear thick mucous.  Using albuterol inhaler and this is helpful for cough and SOB. He feels improved over the last 4d on abx.  Eating and drinking fine now--this is picking up.  No n/v, no LE swelling, no CP.    Past Medical History:  Diagnosis Date  . Allergic rhinitis    allergy shots via [place]  . Allergy   . Anxiety   . 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 CAD, multiple vessel    a. CP/near-syncope 11/2014 s/p DES to MLAD, occluded RCA after takeoff of RV marginal with collaterals treated medically, otherwise mild nonobstructive disease. EF 55%;  b. 07/2015 Lexiscan CL: EF 56%, no ischemia/infarct.  Remains on DAPT as of 03/2017 cards f/u.  Marland Kitchen Cancer ([place])    skin  . Chronic low back pain   . DDD (degenerative disc disease), lumbar   . GERD (gastroesophageal reflux disease)    PPI bid needed to control sx's  . Gout   . Hearing loss   . History  of basal cell carcinoma   . History of adenomatous polyp of colon   . Hypercholesteremia   . Hypertension    Hx labile/uncontrolled.  Improved s/p visit to HTN clinic 2019.  . Iron deficiency anemia 2017   Colonoscopy, EGD, and capsule endoscopy w/out obvious cause/source found.  Oral iron helping as of 03/2016.  . Leg pain 03/06/2016  . 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 Peripheral vascular disease ([place])   . Pleural thickening    chronic  . Prediabetes    HbA1c 6 % 09/2015.     Past Surgical History:  Procedure Laterality Date  . back injection  oct 2011, 07/2010, 12/2012   no surgery  . Capsule endoscopy  02/2016   mild duodenitis, o/w NEG  . CARDIOVASCULAR STRESS TEST  05/2009; 07/2015  . carotid dopplers  11/2014   Bilateral - 1% to 9% ICA stenosis. Vertebral artery flow is  . COLONOSCOPY  08/2008; 02/2016; 02/2017   2017: 12 polyps (adenomatous).  02/2017 repeat TCS--multiple polyps: recall 3 yrs.  . CORONARY ANGIOPLASTY WITH STENT PLACEMENT  12/04/2014   4.0 x 12 mm Synergy stent to the mid LAD  . ESOPHAGOGASTRODUODENOSCOPY  02/2016   Gastritis.  NEG h pylori.    . LE arterial vascular study  02/2016   Per Dr. Radford Pax, no evidence of peripheral vascular disease  . LEFT HEART CATHETERIZATION WITH CORONARY ANGIOGRAM N/A 12/04/2014   Procedure: LEFT HEART CATHETERIZATION WITH CORONARY ANGIOGRAM;  Surgeon: Jolaine Artist, MD; LAD 99% then aneurysmal, OM1 40%, CFX 50%, RCA 40%, 50% then occluded  . ROTATOR CUFF REPAIR Left 09/2013   left  . SHOULDER INJECTION  feb 2014  . TRANSTHORACIC ECHOCARDIOGRAM  11/2014   EF 50-55%, some wall motion abnormalities noted, grade I diast dysfxn    Outpatient Medications Prior to Visit  Medication  Sig Dispense Refill  . acetaminophen (TYLENOL) 500 MG tablet Take 500 mg by mouth every 6 (six) hours as needed for moderate pain.    Marland Kitchen albuterol (VENTOLIN HFA) 108 (90 Base) MCG/ACT inhaler Inhale 1-2 puffs into the lungs every 4 (four) hours as needed.    Marland Kitchen allopurinol (ZYLOPRIM) 300 MG tablet TAKE 1 TABLET BY MOUTH EVERY DAY 90 tablet 3  . amoxicillin-clavulanate (AUGMENTIN XR) 1000-62.5 MG 12 hr tablet Take 2 tablets by mouth 2 (two) times daily.    Marland Kitchen aspirin 81 MG tablet Take 81 mg by mouth daily.      Marland Kitchen azithromycin (ZITHROMAX) 250 MG tablet Take 1 tablet by mouth daily.    Marland Kitchen desonide (DESOWEN) 0.05 % cream Apply 1 application topically daily. Reported on 02/27/2016  1  . diphenhydrAMINE (BENADRYL) 25 MG tablet Take 50 mg by  mouth 3 (three) times daily as needed for itching or allergies.     Marland Kitchen EPINEPHrine 0.3 mg/0.3 mL IJ SOAJ injection AS DIRECTED AS NEEDED    . fluticasone (FLONASE) 50 MCG/ACT nasal spray Place 1 spray into both nostrils daily.    Marland Kitchen gabapentin (NEURONTIN) 300 MG capsule Take 600 mg by mouth 2 (two) times daily.     Marland Kitchen loratadine (CLARITIN) 10 MG tablet Take 20 mg by mouth daily. As needed for allergy    . losartan (COZAAR) 50 MG tablet Take 1 tablet twice daily 180 tablet 3  . metoprolol tartrate (LOPRESSOR) 25 MG tablet TAKE 1 TABLET BY MOUTH TWO TIMES DAILY 180 tablet 3  . nitroGLYCERIN (NITROSTAT) 0.4 MG SL tablet Place 1 tablet (0.4 mg total) under the tongue every 5 (five) minutes as needed for chest pain. 25 tablet 6  . pantoprazole (PROTONIX) 40 MG tablet Take 1 tablet (40 mg total) by mouth daily. 90 tablet 3  . PREVIDENT 5000 BOOSTER PLUS 1.1 % PSTE Take by mouth as directed. APPLY TO TEETH AT BEDTIME  1  . rosuvastatin (CRESTOR) 20 MG tablet TAKE 1 TABLET BY MOUTH EVERY DAY 90 tablet 1  . sildenafil (VIAGRA) 100 MG tablet Take 1 tablet (100 mg total) by mouth daily as needed for erectile dysfunction. 10 tablet 6  . traMADol (ULTRAM) 50 MG tablet Take 1 tablet (50 mg total) by mouth 3 (three) times daily as needed. for pain 90 tablet 2   No facility-administered medications prior to visit.     No Known Allergies  ROS As per HPI  PE: Blood pressure 123/76, pulse (!) 53, temperature 98.2 F (36.8 C), temperature source Oral, resp. rate 16, height 6\' 2"  (1.88 m), weight 207 lb 4 oz (94 kg), SpO2 99 %. VS: noted--normal. Gen: alert, NAD, NONTOXIC APPEARING. HEENT: eyes without injection, drainage, or swelling.  Ears: EACs clear, TMs with normal light reflex and landmarks.  Nose: Clear rhinorrhea, with some dried, crusty exudate adherent to mildly injected mucosa.  No purulent d/c.  No paranasal sinus TTP.  No facial swelling.  Throat and mouth without focal lesion.  No pharyngial  swelling, erythema, or exudate.   Neck: supple, no LAD.   LUNGS: Some mild coarse insp/exp rhonchi but no crackles, wheezes, or areas of diminished breath sounds.  Nonlabored resps.   CV: RRR, no m/r/g. EXT: no c/c/e SKIN: no rash    LABS:    Chemistry      Component Value Date/Time   NA 143 04/21/2018 1036   NA 143 10/28/2017 1129   K 4.3 04/21/2018 1036   CL 105  04/21/2018 1036   CO2 28 04/21/2018 1036   BUN 9 04/21/2018 1036   BUN 8 10/28/2017 1129   CREATININE 0.95 04/21/2018 1036      Component Value Date/Time   CALCIUM 9.6 04/21/2018 1036   ALKPHOS 57 10/06/2017 0854   AST 19 10/06/2017 0854   ALT 15 10/06/2017 0854   BILITOT 0.8 10/06/2017 0854      IMPRESSION AND PLAN:  Acute bronchopneumonia. Improving appropriately after 3d of abx. Continue abx, add prednisone 40mg  qd x 5d, and then 20mg  qd x 5d. Continue albuterol HFA 2p qid prn. Get otc generic robitussin DM OR Mucinex DM and use as directed on the packaging for cough and congestion. Use otc generic saline nasal spray 2-3 times per day to irrigate/moisturize your nasal passages.  An After Visit Summary was printed and given to the patient.  FOLLOW UP: Return for 7-10d, f/u bronchopneumonia.  Signed:  Crissie Sickles, MD           06/16/2018

## 2018-06-17 DIAGNOSIS — K219 Gastro-esophageal reflux disease without esophagitis: Secondary | ICD-10-CM | POA: Diagnosis not present

## 2018-06-17 DIAGNOSIS — J3089 Other allergic rhinitis: Secondary | ICD-10-CM | POA: Diagnosis not present

## 2018-06-17 DIAGNOSIS — J301 Allergic rhinitis due to pollen: Secondary | ICD-10-CM | POA: Diagnosis not present

## 2018-06-17 DIAGNOSIS — R062 Wheezing: Secondary | ICD-10-CM | POA: Diagnosis not present

## 2018-06-21 DIAGNOSIS — J301 Allergic rhinitis due to pollen: Secondary | ICD-10-CM | POA: Diagnosis not present

## 2018-06-21 DIAGNOSIS — J3089 Other allergic rhinitis: Secondary | ICD-10-CM | POA: Diagnosis not present

## 2018-06-23 ENCOUNTER — Encounter: Payer: Self-pay | Admitting: Family Medicine

## 2018-06-23 ENCOUNTER — Ambulatory Visit: Payer: Medicare HMO | Admitting: Family Medicine

## 2018-06-23 VITALS — BP 118/77 | HR 75 | Temp 98.3°F | Resp 16 | Ht 74.0 in | Wt 207.5 lb

## 2018-06-23 DIAGNOSIS — J301 Allergic rhinitis due to pollen: Secondary | ICD-10-CM | POA: Diagnosis not present

## 2018-06-23 DIAGNOSIS — J180 Bronchopneumonia, unspecified organism: Secondary | ICD-10-CM | POA: Diagnosis not present

## 2018-06-23 DIAGNOSIS — J3089 Other allergic rhinitis: Secondary | ICD-10-CM | POA: Diagnosis not present

## 2018-06-23 NOTE — Progress Notes (Signed)
OFFICE VISIT  06/23/2018   CC:  Chief Complaint  Patient presents with  . Follow-up    Bronchopneumonia   HPI:    Patient is a 77 y.o. Caucasian male who presents for 7 day f/u acute bronchopneumonia. Orig dx'd at [facility], rx'd augmentin and azith.  He was improving when I saw him 3 days after starting the abx. I added a 10d prednisone taper.  He has finished his augmentin and zpack. Feeling much better, back to normal schedule. Still with dry cough and some nasal drainage.   No fevers.  No n/v.  Appetite is excellent on the prednisone. No SOB. No CP.   Past Medical History:  Diagnosis Date  . Allergic rhinitis    allergy shots via   . Allergy   . Anxiety   . 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 CAD, multiple vessel    a. CP/near-syncope 11/2014 s/p DES to MLAD, occluded RCA after takeoff of RV marginal with collaterals treated medically, otherwise mild nonobstructive disease. EF 55%;  b. 07/2015 Lexiscan CL: EF 56%, no ischemia/infarct.  Remains on DAPT as of 03/2017 cards f/u.  Marland Kitchen Cancer ([place])    skin  . Chronic low back pain   . DDD (degenerative disc disease), lumbar   . GERD (gastroesophageal reflux disease)    PPI bid needed to control sx's  . Gout   . Hearing loss   . History  of basal cell carcinoma   . History of adenomatous polyp of colon   . Hypercholesteremia   . Hypertension    Hx labile/uncontrolled.  Improved s/p visit to HTN clinic 2019.  . Iron deficiency anemia 2017   Colonoscopy, EGD, and capsule endoscopy w/out obvious cause/source found.  Oral iron helping as of 03/2016.  . Leg pain 03/06/2016  . 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 Peripheral vascular disease ([place])   . Pleural thickening    chronic  . Prediabetes    HbA1c 6 % 09/2015.    Past Surgical History:  Procedure Laterality Date  . back injection  oct 2011, 07/2010, 12/2012   no surgery  . Capsule endoscopy  02/2016   mild duodenitis, o/w NEG  .  CARDIOVASCULAR STRESS TEST  05/2009; 07/2015  . carotid dopplers  11/2014   Bilateral - 1% to 9% ICA stenosis. Vertebral artery flow is  . COLONOSCOPY  08/2008; 02/2016; 02/2017   2017: 12 polyps (adenomatous).  02/2017 repeat TCS--multiple polyps: recall 3 yrs.  . CORONARY ANGIOPLASTY WITH STENT PLACEMENT  12/04/2014   4.0 x 12 mm Synergy stent to the mid LAD  . ESOPHAGOGASTRODUODENOSCOPY  02/2016   Gastritis.  NEG h pylori.    . LE arterial vascular study  02/2016   Per Dr. Radford Pax, no evidence of peripheral vascular disease  . LEFT HEART CATHETERIZATION WITH CORONARY ANGIOGRAM N/A 12/04/2014   Procedure: LEFT HEART CATHETERIZATION WITH CORONARY ANGIOGRAM;  Surgeon: Jolaine Artist, MD; LAD 99% then aneurysmal, OM1 40%, CFX 50%, RCA 40%, 50% then occluded  . ROTATOR CUFF REPAIR Left 09/2013   left  . SHOULDER INJECTION  feb 2014  . TRANSTHORACIC ECHOCARDIOGRAM  11/2014   EF 50-55%, some wall motion abnormalities noted, grade I diast dysfxn    Outpatient Medications Prior to Visit  Medication Sig Dispense Refill  . acetaminophen (TYLENOL) 500 MG tablet Take 500 mg by mouth every 6 (six) hours as needed for moderate pain.    Marland Kitchen albuterol (VENTOLIN HFA) 108 (90 Base) MCG/ACT inhaler  Inhale 1-2 puffs into the lungs every 4 (four) hours as needed.    Marland Kitchen allopurinol (ZYLOPRIM) 300 MG tablet TAKE 1 TABLET BY MOUTH EVERY DAY 90 tablet 3  . aspirin 81 MG tablet Take 81 mg by mouth daily.      Marland Kitchen desonide (DESOWEN) 0.05 % cream Apply 1 application topically daily. Reported on 02/27/2016  1  . diphenhydrAMINE (BENADRYL) 25 MG tablet Take 50 mg by mouth 3 (three) times daily as needed for itching or allergies.     Marland Kitchen EPINEPHrine 0.3 mg/0.3 mL IJ SOAJ injection AS DIRECTED AS NEEDED    . fluticasone (FLONASE) 50 MCG/ACT nasal spray Place 1 spray into both nostrils daily.    Marland Kitchen gabapentin (NEURONTIN) 300 MG capsule Take 600 mg by mouth 2 (two) times daily.     Marland Kitchen loratadine (CLARITIN) 10 MG tablet Take 20 mg by  mouth daily. As needed for allergy    . losartan (COZAAR) 50 MG tablet Take 1 tablet twice daily 180 tablet 3  . metoprolol tartrate (LOPRESSOR) 25 MG tablet TAKE 1 TABLET BY MOUTH TWO TIMES DAILY 180 tablet 3  . nitroGLYCERIN (NITROSTAT) 0.4 MG SL tablet Place 1 tablet (0.4 mg total) under the tongue every 5 (five) minutes as needed for chest pain. 25 tablet 6  . pantoprazole (PROTONIX) 40 MG tablet Take 1 tablet (40 mg total) by mouth daily. 90 tablet 3  . predniSONE (DELTASONE) 20 MG tablet 2 tabs po qd x 5d, then 1 tab po qd x 5d 15 tablet 0  . PREVIDENT 5000 BOOSTER PLUS 1.1 % PSTE Take by mouth as directed. APPLY TO TEETH AT BEDTIME  1  . rosuvastatin (CRESTOR) 20 MG tablet TAKE 1 TABLET BY MOUTH EVERY DAY 90 tablet 1  . sildenafil (VIAGRA) 100 MG tablet Take 1 tablet (100 mg total) by mouth daily as needed for erectile dysfunction. 10 tablet 6  . traMADol (ULTRAM) 50 MG tablet Take 1 tablet (50 mg total) by mouth 3 (three) times daily as needed. for pain 90 tablet 2  . azithromycin (ZITHROMAX) 250 MG tablet Take 1 tablet by mouth daily.     No facility-administered medications prior to visit.     No Known Allergies  ROS As per HPI  PE: Blood pressure 118/77, pulse 75, temperature 98.3 F (36.8 C), temperature source Oral, resp. rate 16, height 6\' 2"  (1.88 m), weight 207 lb 8 oz (94.1 kg), SpO2 95 %. Body mass index is 26.64 kg/m.  Gen: Alert, well appearing.  Patient is oriented to person, place, time, and situation. AFFECT: pleasant, lucid thought and speech. CV: RRR, no m/r/g.   LUNGS: CTA bilat, nonlabored resps, good aeration in all lung fields. EXT: no clubbing or cyanosis.  no edema.    LABS:    Chemistry      Component Value Date/Time   NA 143 04/21/2018 1036   NA 143 10/28/2017 1129   K 4.3 04/21/2018 1036   CL 105 04/21/2018 1036   CO2 28 04/21/2018 1036   BUN 9 04/21/2018 1036   BUN 8 10/28/2017 1129   CREATININE 0.95 04/21/2018 1036      Component Value  Date/Time   CALCIUM 9.6 04/21/2018 1036   ALKPHOS 57 10/06/2017 0854   AST 19 10/06/2017 0854   ALT 15 10/06/2017 0854   BILITOT 0.8 10/06/2017 0854     Lab Results  Component Value Date   WBC 9.2 09/02/2017   HGB 14.9 09/02/2017   HCT 43.4  09/02/2017   MCV 95.3 09/02/2017   PLT 190.0 09/02/2017   Lab Results  Component Value Date   HGBA1C 5.0 10/22/2017    IMPRESSION AND PLAN:  Acute bronchopneumonia: resolving nicely. He is essentially over this, just some residual mild cough that will gradually dissipate/resolve. Finish prednisone.  He can stop trying albuterol since it does not seem to be helping him any. Signs/symptoms to call or return for were reviewed and pt expressed understanding. An After Visit Summary was printed and given to the patient.  FOLLOW UP: Return for keep appt set for 07/22/18.  Signed:  Crissie Sickles, MD           06/23/2018

## 2018-06-28 DIAGNOSIS — J301 Allergic rhinitis due to pollen: Secondary | ICD-10-CM | POA: Diagnosis not present

## 2018-06-28 DIAGNOSIS — J3089 Other allergic rhinitis: Secondary | ICD-10-CM | POA: Diagnosis not present

## 2018-07-06 DIAGNOSIS — J3089 Other allergic rhinitis: Secondary | ICD-10-CM | POA: Diagnosis not present

## 2018-07-06 DIAGNOSIS — J301 Allergic rhinitis due to pollen: Secondary | ICD-10-CM | POA: Diagnosis not present

## 2018-07-19 DIAGNOSIS — J301 Allergic rhinitis due to pollen: Secondary | ICD-10-CM | POA: Diagnosis not present

## 2018-07-19 DIAGNOSIS — J3089 Other allergic rhinitis: Secondary | ICD-10-CM | POA: Diagnosis not present

## 2018-07-22 ENCOUNTER — Ambulatory Visit: Payer: Medicare HMO | Admitting: Family Medicine

## 2018-07-22 ENCOUNTER — Encounter: Payer: Self-pay | Admitting: Family Medicine

## 2018-07-22 ENCOUNTER — Encounter: Payer: Self-pay | Admitting: *Deleted

## 2018-07-22 VITALS — BP 151/83 | HR 57 | Temp 98.1°F | Resp 16 | Ht 74.0 in | Wt 209.1 lb

## 2018-07-22 DIAGNOSIS — G8929 Other chronic pain: Secondary | ICD-10-CM | POA: Diagnosis not present

## 2018-07-22 DIAGNOSIS — M545 Low back pain, unspecified: Secondary | ICD-10-CM

## 2018-07-22 DIAGNOSIS — M159 Polyosteoarthritis, unspecified: Secondary | ICD-10-CM

## 2018-07-22 DIAGNOSIS — I10 Essential (primary) hypertension: Secondary | ICD-10-CM

## 2018-07-22 DIAGNOSIS — M150 Primary generalized (osteo)arthritis: Secondary | ICD-10-CM

## 2018-07-22 DIAGNOSIS — G894 Chronic pain syndrome: Secondary | ICD-10-CM

## 2018-07-22 DIAGNOSIS — M8949 Other hypertrophic osteoarthropathy, multiple sites: Secondary | ICD-10-CM

## 2018-07-22 DIAGNOSIS — E663 Overweight: Secondary | ICD-10-CM | POA: Diagnosis not present

## 2018-07-22 MED ORDER — AMLODIPINE BESYLATE 5 MG PO TABS: 5.0000 mg | ORAL_TABLET | Freq: Every day | ORAL | 0 refills | Status: DC

## 2018-07-22 NOTE — Progress Notes (Signed)
OFFICE VISIT  07/22/2018   CC:  Chief Complaint  Patient presents with  . Follow-up    Chronic Pain, pt is fasting.    HPI:    Patient is a 77 y.o. Caucasian male who presents for 3 mo f/u chronic pain.  Indication for chronic opioid: chronic low back pain (DDD) and arthritis of multiple joints, particularly feet and knees.  Opioids r'xd to maximize functioning and quality of life.  Pt has gotten periodic ESI but has no hx of back surgery Medication and dose: tramadol 50mg , 1 tid prn # pills per month: 90 Last UDS date: 07/22/17 Opioid Treatment Agreement signed (Y/N): 07/22/17 Opioid Treatment Agreement last reviewed with patient: today. [place] reviewed this encounter (include red flags): today, no red flags.  Pain well controlled---using tramadol a couple times per day some days, sometimes none on some days. Occ LBP flares some after physical activity.  BP has been up at home recently--once was 180/110. Has had a URI with cough (no wheezing or SOB) and was taking mucinex DM.  No decongestant. Had a few nosebleeds with lots of nasal mucous.  Yesterday, all of this cleared up quite a bit.   ROS: no CP, no dizziness, no HAs, no LE swelling, no DOE   Past Medical History:  Diagnosis Date  . Allergic rhinitis    allergy shots via [place]  . Allergy   . Anxiety   . 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 CAD, multiple vessel    a. CP/near-syncope 11/2014 s/p DES to MLAD, occluded RCA after takeoff of RV marginal with collaterals treated medically, otherwise mild nonobstructive disease. EF 55%;  b. 07/2015 Lexiscan CL: EF 56%, no ischemia/infarct.  Remains on DAPT as of 03/2017 cards f/u.  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 Chronic low back pain   . DDD (degenerative disc disease), lumbar   . GERD (gastroesophageal reflux disease)    PPI bid needed to control sx's  . Gout   . Hearing loss   . History  of basal cell carcinoma   . History of adenomatous polyp of colon   .  Hypercholesteremia   . Hypertension    Hx labile/uncontrolled.  Improved s/p visit to HTN clinic 2019.  . Iron deficiency anemia 2017   Colonoscopy, EGD, and capsule endoscopy w/out obvious cause/source found.  Oral iron helping as of 03/2016.  . Leg pain 03/06/2016  . 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 Peripheral vascular disease ([place])   . Pleural thickening    chronic  . Prediabetes    HbA1c 6 % 09/2015.    Past Surgical History:  Procedure Laterality Date  . back injection  oct 2011, 07/2010, 12/2012   no surgery  . Capsule endoscopy  02/2016   mild duodenitis, o/w NEG  . CARDIOVASCULAR STRESS TEST  05/2009; 07/2015  . carotid dopplers  11/2014   Bilateral - 1% to 9% ICA stenosis. Vertebral artery flow is  . COLONOSCOPY  08/2008; 02/2016; 02/2017   2017: 12 polyps (adenomatous).  02/2017 repeat TCS--multiple polyps: recall 3 yrs.  . CORONARY ANGIOPLASTY WITH STENT PLACEMENT  12/04/2014   4.0 x 12 mm Synergy stent to the mid LAD  . ESOPHAGOGASTRODUODENOSCOPY  02/2016   Gastritis.  NEG h pylori.    . LE arterial vascular study  02/2016   Per Dr. Radford Pax, no evidence of peripheral vascular disease  . LEFT HEART CATHETERIZATION WITH CORONARY ANGIOGRAM N/A 12/04/2014   Procedure: LEFT HEART CATHETERIZATION WITH  CORONARY ANGIOGRAM;  Surgeon: Jolaine Artist, MD; LAD 99% then aneurysmal, OM1 40%, CFX 50%, RCA 40%, 50% then occluded  . ROTATOR CUFF REPAIR Left 09/2013   left  . SHOULDER INJECTION  feb 2014  . TRANSTHORACIC ECHOCARDIOGRAM  11/2014   EF 50-55%, some wall motion abnormalities noted, grade I diast dysfxn    Outpatient Medications Prior to Visit  Medication Sig Dispense Refill  . acetaminophen (TYLENOL) 500 MG tablet Take 500 mg by mouth every 6 (six) hours as needed for moderate pain.    Marland Kitchen allopurinol (ZYLOPRIM) 300 MG tablet TAKE 1 TABLET BY MOUTH EVERY DAY 90 tablet 3  . aspirin 81 MG tablet Take 81 mg by mouth daily.      Marland Kitchen desonide (DESOWEN) 0.05 % cream Apply 1  application topically daily. Reported on 02/27/2016  1  . diphenhydrAMINE (BENADRYL) 25 MG tablet Take 50 mg by mouth 3 (three) times daily as needed for itching or allergies.     Marland Kitchen EPINEPHrine 0.3 mg/0.3 mL IJ SOAJ injection AS DIRECTED AS NEEDED    . fluticasone (FLONASE) 50 MCG/ACT nasal spray Place 1 spray into both nostrils daily.    Marland Kitchen gabapentin (NEURONTIN) 300 MG capsule Take 600 mg by mouth 2 (two) times daily.     Marland Kitchen loratadine (CLARITIN) 10 MG tablet Take 20 mg by mouth daily. As needed for allergy    . losartan (COZAAR) 50 MG tablet Take 1 tablet twice daily 180 tablet 3  . metoprolol tartrate (LOPRESSOR) 25 MG tablet TAKE 1 TABLET BY MOUTH TWO TIMES DAILY 180 tablet 3  . nitroGLYCERIN (NITROSTAT) 0.4 MG SL tablet Place 1 tablet (0.4 mg total) under the tongue every 5 (five) minutes as needed for chest pain. 25 tablet 6  . pantoprazole (PROTONIX) 40 MG tablet Take 1 tablet (40 mg total) by mouth daily. 90 tablet 3  . PREVIDENT 5000 BOOSTER PLUS 1.1 % PSTE Take by mouth as directed. APPLY TO TEETH AT BEDTIME  1  . rosuvastatin (CRESTOR) 20 MG tablet TAKE 1 TABLET BY MOUTH EVERY DAY 90 tablet 1  . sildenafil (VIAGRA) 100 MG tablet Take 1 tablet (100 mg total) by mouth daily as needed for erectile dysfunction. 10 tablet 6  . traMADol (ULTRAM) 50 MG tablet Take 1 tablet (50 mg total) by mouth 3 (three) times daily as needed. for pain 90 tablet 2  . albuterol (VENTOLIN HFA) 108 (90 Base) MCG/ACT inhaler Inhale 1-2 puffs into the lungs every 4 (four) hours as needed.    Marland Kitchen azithromycin (ZITHROMAX) 250 MG tablet Take 1 tablet by mouth daily.    . predniSONE (DELTASONE) 20 MG tablet 2 tabs po qd x 5d, then 1 tab po qd x 5d (Patient not taking: Reported on 07/22/2018) 15 tablet 0   No facility-administered medications prior to visit.     No Known Allergies  ROS As per HPI  PE: Blood pressure (!) 151/83, pulse (!) 57, temperature 98.1 F (36.7 C), temperature source Oral, resp. rate 16,  height 6\' 2"  (1.88 m), weight 209 lb 2 oz (94.9 kg), SpO2 97 %. Gen: Alert, well appearing.  Patient is oriented to person, place, time, and situation. AFFECT: pleasant, lucid thought and speech. CV: RRR, no m/r/g.   LUNGS: CTA bilat, nonlabored resps, good aeration in all lung fields. EXT: no clubbing or cyanosis.  no edema.    LABS:    Chemistry      Component Value Date/Time   NA 143 04/21/2018 1036  NA 143 10/28/2017 1129   K 4.3 04/21/2018 1036   CL 105 04/21/2018 1036   CO2 28 04/21/2018 1036   BUN 9 04/21/2018 1036   BUN 8 10/28/2017 1129   CREATININE 0.95 04/21/2018 1036      Component Value Date/Time   CALCIUM 9.6 04/21/2018 1036   ALKPHOS 57 10/06/2017 0854   AST 19 10/06/2017 0854   ALT 15 10/06/2017 0854   BILITOT 0.8 10/06/2017 0854      IMPRESSION AND PLAN:  1) Chronic pain syndrome: lumbar spondylosis, osteoarthritis multiple sites. The current medical regimen is effective;  continue present plan and medications. Renewed pt's controlled substance contract today.   Will get UDS next f/u visit. No new rx for tramadol was needed today.  2) Uncontrolled HTN: plan is to add 5mg  amlodipine qd to his current regimen of losartan 50mg  bid and lopressor 25mg  bid. Monitor home bp, f/u to review in 2 wks.  An After Visit Summary was printed and given to the patient.  FOLLOW UP: Return in about 2 weeks (around 08/05/2018) for f/u HTN.  Signed:  Crissie Sickles, MD           07/22/2018

## 2018-07-28 ENCOUNTER — Ambulatory Visit: Payer: Medicare HMO | Admitting: Family Medicine

## 2018-07-28 ENCOUNTER — Ambulatory Visit (INDEPENDENT_AMBULATORY_CARE_PROVIDER_SITE_OTHER): Payer: Medicare HMO

## 2018-07-28 ENCOUNTER — Encounter: Payer: Self-pay | Admitting: Family Medicine

## 2018-07-28 VITALS — BP 118/78 | HR 56 | Temp 98.0°F | Ht 74.0 in | Wt 210.4 lb

## 2018-07-28 DIAGNOSIS — S299XXA Unspecified injury of thorax, initial encounter: Secondary | ICD-10-CM | POA: Diagnosis not present

## 2018-07-28 DIAGNOSIS — S3992XA Unspecified injury of lower back, initial encounter: Secondary | ICD-10-CM

## 2018-07-28 DIAGNOSIS — R079 Chest pain, unspecified: Secondary | ICD-10-CM | POA: Diagnosis not present

## 2018-07-28 DIAGNOSIS — S20229A Contusion of unspecified back wall of thorax, initial encounter: Secondary | ICD-10-CM

## 2018-07-28 DIAGNOSIS — M545 Low back pain: Secondary | ICD-10-CM | POA: Diagnosis not present

## 2018-07-28 NOTE — Progress Notes (Signed)
Patient: Brandon Haynes MRN: [id number] DOB: [birth year] PCP: Tammi Sou, MD     Subjective:  Chief Complaint  Patient presents with  . Back Pain    s/p fall on 10/28    HPI: The patient is a 77 y.o. male who presents today for left sided mid-lower back pain.  S/P fall on 10/28. He has a Building services engineer with a ramp. He was pulling a generator (200pounds) up the ramp and the back wheel ran off the ramp and it pulled him with it. He fell on his side into a door jam with a 2x4 sticking out. He fell against this, not to the ground. He had to lay on the floor due to the pain for a little bit, but then got a lawn mower and mowed for 2 hours. Pain is located on lumbar spine and up into his lower left thoracic spine on lateral side. No bruising. Pain rated as a 2/10 when he is sitting, but if he turns, stands up it feels like a ligthening bolt going through him and pain is a 5/10. Pain is sharp and does not radiate anywhere. He has taken tylenol and tramadol and it has helped some. Doesn't "kill it" but it helps. Pain seems to be worse today then the previous days. Has known DDD in L5 and gets injections for this. This pain is completely different.   Review of Systems  Constitutional: Negative for chills and fever.  Respiratory: Negative for cough and shortness of breath.   Gastrointestinal: Negative for abdominal pain.  Musculoskeletal: Positive for back pain. Negative for myalgias.    Allergies Patient has No Known Allergies.  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  Surgical History Patient  has a past surgical history that includes back injection (oct 2011, 07/2010, 12/2012); Shoulder injection (feb 2014); Rotator cuff repair (Left, 09/2013); Cardiovascular stress test (05/2009; 07/2015); Colonoscopy (08/2008; 02/2016; 02/2017); left heart catheterization with coronary angiogram (N/A, 12/04/2014); Coronary angioplasty with stent (12/04/2014); carotid dopplers (11/2014); transthoracic echocardiogram (11/2014); LE arterial vascular study (02/2016); Esophagogastroduodenoscopy (02/2016); and Capsule endoscopy (02/2016).  Family History Pateint's family history includes Breast cancer in his mother and sister; COPD in his brother; Congestive Heart Failure in his mother; Diabetes in his mother; Heart disease in his brother and father; Lung cancer in his father.  Social History Patient  reports that he quit smoking about 19 years ago. His smoking use included cigarettes. He has a 70.00 pack-year smoking history. He quit smokeless tobacco use about 32 years ago.  His smokeless tobacco use included snuff. He reports that he does not drink alcohol or use drugs.    Objective: Vitals:   07/28/18 1359  BP: 118/78  Pulse: (!) 56  Temp: 98 F (36.7 C)  TempSrc: Oral  SpO2: 96%  Weight: 210 lb 6.4 oz (95.4 kg)  Height: 6\' 2"  (1.88 m)    Body mass index is 27.01 kg/m.  Physical Exam  Constitutional: He is oriented to person, place, and time. He appears well-developed and well-nourished.  Cardiovascular: Normal rate, regular rhythm and normal heart sounds.  Pulmonary/Chest:  Effort normal and breath sounds normal.  Musculoskeletal:  Significant TTP over his posterior thoracic ribs around T10-T12, lateral side. Mild TTP over lower lumbar spine. No erythema or bruising.   Neurological: He is alert and oriented to person, place, and time.  Vitals reviewed.  Rib xray:  No  acute fractures Lumbar xray: no acute findings.     Assessment/plan: 1. Contusion of back wall of thorax, initial encounter Likely bad contusion. Continue with conservative therapy. recommended heating pad, deep breathing and can increase his tramadol to bid-tid if needed. If not getting better in 7-10 days, f/u with pcp.  - DG Lumbar Spine Complete; Future - DG Ribs Unilateral Left; Future    Return if symptoms worsen or fail to improve.     Orma Flaming, MD [place]   07/28/2018

## 2018-07-29 ENCOUNTER — Ambulatory Visit: Payer: Medicare HMO | Admitting: Podiatry

## 2018-07-29 DIAGNOSIS — J301 Allergic rhinitis due to pollen: Secondary | ICD-10-CM | POA: Diagnosis not present

## 2018-07-29 DIAGNOSIS — J3089 Other allergic rhinitis: Secondary | ICD-10-CM | POA: Diagnosis not present

## 2018-08-02 DIAGNOSIS — J301 Allergic rhinitis due to pollen: Secondary | ICD-10-CM | POA: Diagnosis not present

## 2018-08-02 DIAGNOSIS — J3089 Other allergic rhinitis: Secondary | ICD-10-CM | POA: Diagnosis not present

## 2018-08-04 DIAGNOSIS — J3089 Other allergic rhinitis: Secondary | ICD-10-CM | POA: Diagnosis not present

## 2018-08-04 DIAGNOSIS — J301 Allergic rhinitis due to pollen: Secondary | ICD-10-CM | POA: Diagnosis not present

## 2018-08-05 ENCOUNTER — Ambulatory Visit: Payer: Medicare HMO | Admitting: Family Medicine

## 2018-08-05 ENCOUNTER — Encounter: Payer: Self-pay | Admitting: Family Medicine

## 2018-08-05 VITALS — BP 103/66 | HR 56 | Resp 16 | Ht 74.0 in | Wt 209.0 lb

## 2018-08-05 DIAGNOSIS — S300XXD Contusion of lower back and pelvis, subsequent encounter: Secondary | ICD-10-CM

## 2018-08-05 DIAGNOSIS — I10 Essential (primary) hypertension: Secondary | ICD-10-CM

## 2018-08-05 NOTE — Patient Instructions (Signed)
Low Back Sprain Rehab  Ask your health care provider which exercises are safe for you. Do exercises exactly as told by your health care provider and adjust them as directed. It is normal to feel mild stretching, pulling, tightness, or discomfort as you do these exercises, but you should stop right away if you feel sudden pain or your pain gets worse. Do not begin these exercises until told by your health care provider.  Stretching and range of motion exercises  These exercises warm up your muscles and joints and improve the movement and flexibility of your back. These exercises also help to relieve pain, numbness, and tingling.  Exercise A: Lumbar rotation    1. Lie on your back on a firm surface and bend your knees.  2. Straighten your arms out to your sides so each arm forms an "L" shape with a side of your body (a 90 degree angle).  3. Slowly move both of your knees to one side of your body until you feel a stretch in your lower back. Try not to let your shoulders move off of the floor.  4. Hold for __________ seconds.  5. Tense your abdominal muscles and slowly move your knees back to the starting position.  6. Repeat this exercise on the other side of your body.  Repeat __________ times. Complete this exercise __________ times a day.  Exercise B: Prone extension on elbows    1. Lie on your abdomen on a firm surface.  2. Prop yourself up on your elbows.  3. Use your arms to help lift your chest up until you feel a gentle stretch in your abdomen and your lower back.  ? This will place some of your body weight on your elbows. If this is uncomfortable, try stacking pillows under your chest.  ? Your hips should stay down, against the surface that you are lying on. Keep your hip and back muscles relaxed.  4. Hold for __________ seconds.  5. Slowly relax your upper body and return to the starting position.  Repeat __________ times. Complete this exercise __________ times a day.  Strengthening exercises  These  exercises build strength and endurance in your back. Endurance is the ability to use your muscles for a long time, even after they get tired.  Exercise C: Pelvic tilt  1. Lie on your back on a firm surface. Bend your knees and keep your feet flat.  2. Tense your abdominal muscles. Tip your pelvis up toward the ceiling and flatten your lower back into the floor.  ? To help with this exercise, you may place a small towel under your lower back and try to push your back into the towel.  3. Hold for __________ seconds.  4. Let your muscles relax completely before you repeat this exercise.  Repeat __________ times. Complete this exercise __________ times a day.  Exercise D: Alternating arm and leg raises    1. Get on your hands and knees on a firm surface. If you are on a hard floor, you may want to use padding to cushion your knees, such as an exercise mat.  2. Line up your arms and legs. Your hands should be below your shoulders, and your knees should be below your hips.  3. Lift your left leg behind you. At the same time, raise your right arm and straighten it in front of you.  ? Do not lift your leg higher than your hip.  ? Do not lift your arm   higher than your shoulder.  ? Keep your abdominal and back muscles tight.  ? Keep your hips facing the ground.  ? Do not arch your back.  ? Keep your balance carefully, and do not hold your breath.  4. Hold for __________ seconds.  5. Slowly return to the starting position and repeat with your right leg and your left arm.  Repeat __________ times. Complete this exercise __________ times a day.  Exercise E: Abdominal set with straight leg raise    1. Lie on your back on a firm surface.  2. Bend one of your knees and keep your other leg straight.  3. Tense your abdominal muscles and lift your straight leg up, 4-6 inches (10-15 cm) off the ground.  4. Keep your abdominal muscles tight and hold for __________ seconds.  ? Do not hold your breath.  ? Do not arch your back. Keep it  flat against the ground.  5. Keep your abdominal muscles tense as you slowly lower your leg back to the starting position.  6. Repeat with your other leg.  Repeat __________ times. Complete this exercise __________ times a day.  Posture and body mechanics    Body mechanics refers to the movements and positions of your body while you do your daily activities. Posture is part of body mechanics. Good posture and healthy body mechanics can help to relieve stress in your body's tissues and joints. Good posture means that your spine is in its natural S-curve position (your spine is neutral), your shoulders are pulled back slightly, and your head is not tipped forward. The following are general guidelines for applying improved posture and body mechanics to your everyday activities.  Standing    · When standing, keep your spine neutral and your feet about hip-width apart. Keep a slight bend in your knees. Your ears, shoulders, and hips should line up.  · When you do a task in which you stand in one place for a long time, place one foot up on a stable object that is 2-4 inches (5-10 cm) high, such as a footstool. This helps keep your spine neutral.  Sitting    · When sitting, keep your spine neutral and keep your feet flat on the floor. Use a footrest, if necessary, and keep your thighs parallel to the floor. Avoid rounding your shoulders, and avoid tilting your head forward.  · When working at a desk or a computer, keep your desk at a height where your hands are slightly lower than your elbows. Slide your chair under your desk so you are close enough to maintain good posture.  · When working at a computer, place your monitor at a height where you are looking straight ahead and you do not have to tilt your head forward or downward to look at the screen.  Resting    · When lying down and resting, avoid positions that are most painful for you.  · If you have pain with activities such as sitting, bending, stooping, or squatting  (flexion-based activities), lie in a position in which your body does not bend very much. For example, avoid curling up on your side with your arms and knees near your chest (fetal position).  · If you have pain with activities such as standing for a long time or reaching with your arms (extension-based activities), lie with your spine in a neutral position and bend your knees slightly. Try the following positions:  · Lying on your side with a   pillow between your knees.  · Lying on your back with a pillow under your knees.  Lifting    · When lifting objects, keep your feet at least shoulder-width apart and tighten your abdominal muscles.  · Bend your knees and hips and keep your spine neutral. It is important to lift using the strength of your legs, not your back. Do not lock your knees straight out.  · Always ask for help to lift heavy or awkward objects.  This information is not intended to replace advice given to you by your health care provider. Make sure you discuss any questions you have with your health care provider.  Document Released: 09/14/2005 Document Revised: 05/21/2016 Document Reviewed: 06/26/2015  Elsevier Interactive Patient Education © 2018 [employer]

## 2018-08-05 NOTE — Progress Notes (Signed)
OFFICE VISIT  08/05/2018   CC:  Chief Complaint  Patient presents with  . Hypertension    follow up   HPI:    Patient is a 77 y.o. Caucasian male who presents for 2 wk f/u uncontrolled HTN. Last visit I added amlodipine 5mg  to his regimen of lopressor 25mg  bid and losartan 50mg  bid. Home bp's avg 130/65-70 since that time.   No side effects from the med.  Unfortunately he suffered a back/rib contusion about a week ago: x-rays neg for acute bony injury. He is still hurting as bad as he was right after the fall.  Has been taking tylenol and tramadol (3-4 tabs per day). He is not stretching any or applying any ice.  Pain located in L LB/iliac crest region.  Past Medical History:  Diagnosis Date  . Allergic rhinitis    allergy shots via [place]  . Allergy   . Anxiety   . 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 CAD, multiple vessel    a. CP/near-syncope 11/2014 s/p DES to MLAD, occluded RCA after takeoff of RV marginal with collaterals treated medically, otherwise mild nonobstructive disease. EF 55%;  b. 07/2015 Lexiscan CL: EF 56%, no ischemia/infarct.  Remains on DAPT as of 03/2017 cards f/u.  Marland Kitchen Cancer ([place])    skin  . Chronic low back pain   . DDD (degenerative disc disease), lumbar   . GERD (gastroesophageal reflux disease)    PPI bid needed to control sx's  . Gout   . Hearing loss   . History  of basal cell carcinoma   . History of adenomatous polyp of colon   . Hypercholesteremia   . Hypertension    Hx labile/uncontrolled.  Improved s/p visit to HTN clinic 2019.  . Iron deficiency anemia 2017   Colonoscopy, EGD, and capsule endoscopy w/out obvious cause/source found.  Oral iron helping as of 03/2016.  . Leg pain 03/06/2016  . 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 Peripheral vascular disease ([place])   . Pleural thickening    chronic  . Prediabetes    HbA1c 6 % 09/2015.    Past Surgical History:  Procedure Laterality Date  . back injection  oct 2011, 07/2010,  12/2012   no surgery  . Capsule endoscopy  02/2016   mild duodenitis, o/w NEG  . CARDIOVASCULAR STRESS TEST  05/2009; 07/2015  . carotid dopplers  11/2014   Bilateral - 1% to 9% ICA stenosis. Vertebral artery flow is  . COLONOSCOPY  08/2008; 02/2016; 02/2017   2017: 12 polyps (adenomatous).  02/2017 repeat TCS--multiple polyps: recall 3 yrs.  . CORONARY ANGIOPLASTY WITH STENT PLACEMENT  12/04/2014   4.0 x 12 mm Synergy stent to the mid LAD  . ESOPHAGOGASTRODUODENOSCOPY  02/2016   Gastritis.  NEG h pylori.    . LE arterial vascular study  02/2016   Per Dr. Radford Pax, no evidence of peripheral vascular disease  . LEFT HEART CATHETERIZATION WITH CORONARY ANGIOGRAM N/A 12/04/2014   Procedure: LEFT HEART CATHETERIZATION WITH CORONARY ANGIOGRAM;  Surgeon: Jolaine Artist, MD; LAD 99% then aneurysmal, OM1 40%, CFX 50%, RCA 40%, 50% then occluded  . ROTATOR CUFF REPAIR Left 09/2013   left  . SHOULDER INJECTION  feb 2014  . TRANSTHORACIC ECHOCARDIOGRAM  11/2014   EF 50-55%, some wall motion abnormalities noted, grade I diast dysfxn    Outpatient Medications Prior to Visit  Medication Sig Dispense Refill  . acetaminophen (TYLENOL) 500 MG tablet Take 500 mg by mouth every 6 (  six) hours as needed for moderate pain.    Marland Kitchen allopurinol (ZYLOPRIM) 300 MG tablet TAKE 1 TABLET BY MOUTH EVERY DAY 90 tablet 3  . amLODipine (NORVASC) 5 MG tablet Take 1 tablet (5 mg total) by mouth daily. 90 tablet 0  . aspirin 81 MG tablet Take 81 mg by mouth daily.      Marland Kitchen desonide (DESOWEN) 0.05 % cream Apply 1 application topically daily. Reported on 02/27/2016  1  . diphenhydrAMINE (BENADRYL) 25 MG tablet Take 50 mg by mouth 3 (three) times daily as needed for itching or allergies.     Marland Kitchen EPINEPHrine 0.3 mg/0.3 mL IJ SOAJ injection AS DIRECTED AS NEEDED    . fluticasone (FLONASE) 50 MCG/ACT nasal spray Place 1 spray into both nostrils daily.    Marland Kitchen gabapentin (NEURONTIN) 300 MG capsule Take 600 mg by mouth 2 (two) times daily.     Marland Kitchen  loratadine (CLARITIN) 10 MG tablet Take 20 mg by mouth daily. As needed for allergy    . losartan (COZAAR) 50 MG tablet Take 1 tablet twice daily 180 tablet 3  . metoprolol tartrate (LOPRESSOR) 25 MG tablet TAKE 1 TABLET BY MOUTH TWO TIMES DAILY 180 tablet 3  . nitroGLYCERIN (NITROSTAT) 0.4 MG SL tablet Place 1 tablet (0.4 mg total) under the tongue every 5 (five) minutes as needed for chest pain. 25 tablet 6  . pantoprazole (PROTONIX) 40 MG tablet Take 1 tablet (40 mg total) by mouth daily. 90 tablet 3  . PREVIDENT 5000 BOOSTER PLUS 1.1 % PSTE Take by mouth as directed. APPLY TO TEETH AT BEDTIME  1  . rosuvastatin (CRESTOR) 20 MG tablet TAKE 1 TABLET BY MOUTH EVERY DAY 90 tablet 1  . sildenafil (VIAGRA) 100 MG tablet Take 1 tablet (100 mg total) by mouth daily as needed for erectile dysfunction. 10 tablet 6  . traMADol (ULTRAM) 50 MG tablet Take 1 tablet (50 mg total) by mouth 3 (three) times daily as needed. for pain 90 tablet 2   No facility-administered medications prior to visit.     No Known Allergies  ROS As per HPI  PE: Blood pressure 103/66, pulse (!) 56, resp. rate 16, height 6\' 2"  (1.88 m), weight 209 lb (94.8 kg), SpO2 97 %. Gen: Alert, well appearing.  Patient is oriented to person, place, time, and situation. AFFECT: pleasant, lucid thought and speech. CV: RRR, no m/r/g.   LUNGS: CTA bilat, nonlabored resps, good aeration in all lung fields. EXT: no clubbing or cyanosis.  no edema.  Back: mild TTP diffusely in L lumbar region extending over L iliac crest region where there is a large bruise.   No hematoma.  LABS:    Chemistry      Component Value Date/Time   NA 143 04/21/2018 1036   NA 143 10/28/2017 1129   K 4.3 04/21/2018 1036   CL 105 04/21/2018 1036   CO2 28 04/21/2018 1036   BUN 9 04/21/2018 1036   BUN 8 10/28/2017 1129   CREATININE 0.95 04/21/2018 1036      Component Value Date/Time   CALCIUM 9.6 04/21/2018 1036   ALKPHOS 57 10/06/2017 0854   AST 19  10/06/2017 0854   ALT 15 10/06/2017 0854   BILITOT 0.8 10/06/2017 0854      IMPRESSION AND PLAN:  1) HTN: good control now. Continue amlodipine 5mg  qd, lopressor 25mg  bid, and losartan 50mg  bid.  2) L LB/iliac crest contusion: ongoing discomfort, likely not improving any b/c he is not  moving around much at all. Encouraged pt to do home stretching twice per day--handouts reviewed and given to pt today. He'll continue tylenol 1000 mg q6h prn, and tramadol prn. No new rx for tramadol was given today.  An After Visit Summary was printed and given to the patient.  FOLLOW UP: Return in about 10 weeks (around 10/14/2018) for chronic pain f/u.  Signed:  Crissie Sickles, MD           08/05/2018

## 2018-08-09 DIAGNOSIS — J301 Allergic rhinitis due to pollen: Secondary | ICD-10-CM | POA: Diagnosis not present

## 2018-08-09 DIAGNOSIS — J3089 Other allergic rhinitis: Secondary | ICD-10-CM | POA: Diagnosis not present

## 2018-08-11 DIAGNOSIS — J3089 Other allergic rhinitis: Secondary | ICD-10-CM | POA: Diagnosis not present

## 2018-08-11 DIAGNOSIS — J301 Allergic rhinitis due to pollen: Secondary | ICD-10-CM | POA: Diagnosis not present

## 2018-08-17 DIAGNOSIS — J3089 Other allergic rhinitis: Secondary | ICD-10-CM | POA: Diagnosis not present

## 2018-08-17 DIAGNOSIS — J301 Allergic rhinitis due to pollen: Secondary | ICD-10-CM | POA: Diagnosis not present

## 2018-08-22 DIAGNOSIS — J301 Allergic rhinitis due to pollen: Secondary | ICD-10-CM | POA: Diagnosis not present

## 2018-08-22 DIAGNOSIS — J3089 Other allergic rhinitis: Secondary | ICD-10-CM | POA: Diagnosis not present

## 2018-08-29 ENCOUNTER — Other Ambulatory Visit: Payer: Self-pay | Admitting: Gastroenterology

## 2018-08-30 ENCOUNTER — Telehealth: Payer: Self-pay

## 2018-08-30 ENCOUNTER — Other Ambulatory Visit: Payer: Self-pay | Admitting: Gastroenterology

## 2018-08-30 DIAGNOSIS — D509 Iron deficiency anemia, unspecified: Secondary | ICD-10-CM

## 2018-08-30 NOTE — Telephone Encounter (Signed)
-----   Message from Roetta Sessions, [place] sent at 08/16/2018 11:29 AM EST ----- Regarding: needs cbc in December  Needs cbc around 09/02/18. See lab result from 09/02/17

## 2018-08-30 NOTE — Telephone Encounter (Signed)
Called and spoke to patient. He will go to lab one day this week. Cbc order entered.

## 2018-08-31 ENCOUNTER — Other Ambulatory Visit (INDEPENDENT_AMBULATORY_CARE_PROVIDER_SITE_OTHER): Payer: Medicare HMO

## 2018-08-31 DIAGNOSIS — J3089 Other allergic rhinitis: Secondary | ICD-10-CM | POA: Diagnosis not present

## 2018-08-31 DIAGNOSIS — D509 Iron deficiency anemia, unspecified: Secondary | ICD-10-CM

## 2018-08-31 DIAGNOSIS — J301 Allergic rhinitis due to pollen: Secondary | ICD-10-CM | POA: Diagnosis not present

## 2018-08-31 LAB — CBC WITH DIFFERENTIAL/PLATELET
BASOS ABS: 0.1 10*3/uL (ref 0.0–0.1)
Basophils Relative: 0.8 % (ref 0.0–3.0)
Eosinophils Absolute: 0.3 10*3/uL (ref 0.0–0.7)
Eosinophils Relative: 3.2 % (ref 0.0–5.0)
HCT: 44.4 % (ref 39.0–52.0)
Hemoglobin: 15.3 g/dL (ref 13.0–17.0)
Lymphocytes Relative: 35.4 % (ref 12.0–46.0)
Lymphs Abs: 2.9 10*3/uL (ref 0.7–4.0)
MCHC: 34.5 g/dL (ref 30.0–36.0)
MCV: 93.2 fl (ref 78.0–100.0)
MONO ABS: 0.7 10*3/uL (ref 0.1–1.0)
Monocytes Relative: 8.1 % (ref 3.0–12.0)
Neutro Abs: 4.3 10*3/uL (ref 1.4–7.7)
Neutrophils Relative %: 52.5 % (ref 43.0–77.0)
Platelets: 192 10*3/uL (ref 150.0–400.0)
RBC: 4.76 Mil/uL (ref 4.22–5.81)
RDW: 13.7 % (ref 11.5–15.5)
WBC: 8.2 10*3/uL (ref 4.0–10.5)

## 2018-09-01 ENCOUNTER — Encounter: Payer: Self-pay | Admitting: Gastroenterology

## 2018-09-07 DIAGNOSIS — Z0100 Encounter for examination of eyes and vision without abnormal findings: Secondary | ICD-10-CM | POA: Diagnosis not present

## 2018-09-08 DIAGNOSIS — J301 Allergic rhinitis due to pollen: Secondary | ICD-10-CM | POA: Diagnosis not present

## 2018-09-08 DIAGNOSIS — J3089 Other allergic rhinitis: Secondary | ICD-10-CM | POA: Diagnosis not present

## 2018-09-14 DIAGNOSIS — J3089 Other allergic rhinitis: Secondary | ICD-10-CM | POA: Diagnosis not present

## 2018-09-14 DIAGNOSIS — J301 Allergic rhinitis due to pollen: Secondary | ICD-10-CM | POA: Diagnosis not present

## 2018-09-19 DIAGNOSIS — J3089 Other allergic rhinitis: Secondary | ICD-10-CM | POA: Diagnosis not present

## 2018-09-19 DIAGNOSIS — J301 Allergic rhinitis due to pollen: Secondary | ICD-10-CM | POA: Diagnosis not present

## 2018-09-27 ENCOUNTER — Other Ambulatory Visit: Payer: Self-pay | Admitting: Family Medicine

## 2018-09-30 DIAGNOSIS — J3089 Other allergic rhinitis: Secondary | ICD-10-CM | POA: Diagnosis not present

## 2018-09-30 DIAGNOSIS — J301 Allergic rhinitis due to pollen: Secondary | ICD-10-CM | POA: Diagnosis not present

## 2018-10-05 DIAGNOSIS — J301 Allergic rhinitis due to pollen: Secondary | ICD-10-CM | POA: Diagnosis not present

## 2018-10-05 DIAGNOSIS — J3089 Other allergic rhinitis: Secondary | ICD-10-CM | POA: Diagnosis not present

## 2018-10-11 DIAGNOSIS — J3089 Other allergic rhinitis: Secondary | ICD-10-CM | POA: Diagnosis not present

## 2018-10-11 DIAGNOSIS — J301 Allergic rhinitis due to pollen: Secondary | ICD-10-CM | POA: Diagnosis not present

## 2018-10-14 ENCOUNTER — Ambulatory Visit: Payer: Medicare HMO | Admitting: Family Medicine

## 2018-10-14 ENCOUNTER — Encounter: Payer: Self-pay | Admitting: Family Medicine

## 2018-10-14 ENCOUNTER — Other Ambulatory Visit: Payer: Self-pay | Admitting: Family Medicine

## 2018-10-14 VITALS — BP 115/73 | HR 96 | Temp 97.5°F | Resp 16 | Ht 74.0 in | Wt 214.4 lb

## 2018-10-14 DIAGNOSIS — M170 Bilateral primary osteoarthritis of knee: Secondary | ICD-10-CM

## 2018-10-14 DIAGNOSIS — M5441 Lumbago with sciatica, right side: Secondary | ICD-10-CM | POA: Diagnosis not present

## 2018-10-14 DIAGNOSIS — G894 Chronic pain syndrome: Secondary | ICD-10-CM

## 2018-10-14 MED ORDER — TRAMADOL HCL 50 MG PO TABS: 50.0000 mg | ORAL_TABLET | Freq: Three times a day (TID) | ORAL | 2 refills | Status: DC | PRN

## 2018-10-14 NOTE — Progress Notes (Signed)
OFFICE VISIT  10/14/2018   CC:  Chief Complaint  Patient presents with  . Follow-up    Pain, fasting     HPI:    Patient is a 78 y.o. Caucasian male who presents for 3 mo f/u chronic pain syndrome.  Indication for chronic opioid: chronic low back pain (DDD) and arthritis of multiple joints, particularly feet and knees. Opioids r'xd to maximize functioning and quality of life. Pt has gotten periodic ESI but has no hx of back surgery Medication and dose: tramadol 50mg , 1tid prn # pills per month: 90 Last UDS date: 07/22/17 Opioid Treatment Agreement signed (Y/N): Y, 07/22/18 Opioid Treatment Agreement last reviewed with patient:  today [place] reviewed this encounter (include red flags):  today, no red flags. Most recent tramadol fill date is 09/08/18.  Interim hx: doing well.  Pain well controlled with use of tramadol and he sometimes adds some tylenol to it. Typically takes 2 tramadol per day, sometimes 3.  No adverse side effects. Most recent dose of tramadol was yesterday afternoon.  Having a bit of mild/mod pain in R glut that radiates down back of hamstring to about mid femur level. Stretching LB/glut/hamstring helps some. He has gotten ESI for this in the past when it gets severe and it has helped (Dr. Sherwood Gambler).  Past Medical History:  Diagnosis Date  . Allergic rhinitis    allergy shots via [place]  . Allergy   . Anxiety   . 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 CAD, multiple vessel    a. CP/near-syncope 11/2014 s/p DES to MLAD, occluded RCA after takeoff of RV marginal with collaterals treated medically, otherwise mild nonobstructive disease. EF 55%;  b. 07/2015 Lexiscan CL: EF 56%, no ischemia/infarct.  Remains on DAPT as of 03/2017 cards f/u.  Marland Kitchen Cancer ([place])    skin  . Chronic low back pain   . DDD (degenerative disc disease), lumbar   . GERD (gastroesophageal reflux disease)    PPI bid needed to control sx's  . Gout   . Hearing loss   . History  of basal  cell carcinoma   . History of adenomatous polyp of colon   . Hypercholesteremia   . Hypertension    Hx labile/uncontrolled.  Improved s/p visit to HTN clinic 2019.  . Iron deficiency anemia 2017   Colonoscopy, EGD, and capsule endoscopy w/out obvious cause/source found.  Oral iron helping as of 03/2016.  . Leg pain 03/06/2016  . 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 Peripheral vascular disease ([place])   . Pleural thickening    chronic  . Prediabetes    HbA1c 6 % 09/2015.    Past Surgical History:  Procedure Laterality Date  . back injection  oct 2011, 07/2010, 12/2012   no surgery  . Capsule endoscopy  02/2016   mild duodenitis, o/w NEG  . CARDIOVASCULAR STRESS TEST  05/2009; 07/2015  . carotid dopplers  11/2014   Bilateral - 1% to 9% ICA stenosis. Vertebral artery flow is  . COLONOSCOPY  08/2008; 02/2016; 02/2017   2017: 12 polyps (adenomatous).  02/2017 repeat TCS--multiple polyps: recall 3 yrs.  . CORONARY ANGIOPLASTY WITH STENT PLACEMENT  12/04/2014   4.0 x 12 mm Synergy stent to the mid LAD  . ESOPHAGOGASTRODUODENOSCOPY  02/2016   Gastritis.  NEG h pylori.    . LE arterial vascular study  02/2016   Per Dr. Radford Pax, no evidence of peripheral vascular disease  . LEFT HEART CATHETERIZATION WITH CORONARY ANGIOGRAM N/A 12/04/2014  Procedure: LEFT HEART CATHETERIZATION WITH CORONARY ANGIOGRAM;  Surgeon: Jolaine Artist, MD; LAD 99% then aneurysmal, OM1 40%, CFX 50%, RCA 40%, 50% then occluded  . ROTATOR CUFF REPAIR Left 09/2013   left  . SHOULDER INJECTION  feb 2014  . TRANSTHORACIC ECHOCARDIOGRAM  11/2014   EF 50-55%, some wall motion abnormalities noted, grade I diast dysfxn    Outpatient Medications Prior to Visit  Medication Sig Dispense Refill  . acetaminophen (TYLENOL) 500 MG tablet Take 500 mg by mouth every 6 (six) hours as needed for moderate pain.    Marland Kitchen allopurinol (ZYLOPRIM) 300 MG tablet TAKE 1 TABLET BY MOUTH EVERY DAY 90 tablet 3  . amLODipine (NORVASC) 5 MG tablet  TAKE 1 TABLET BY MOUTH EVERY DAY 90 tablet 1  . aspirin 81 MG tablet Take 81 mg by mouth daily.      . clotrimazole-betamethasone (LOTRISONE) cream APPLY TO AFFECTED AREA 1-2 TIMES DAILY  2  . desonide (DESOWEN) 0.05 % cream Apply 1 application topically daily. Reported on 02/27/2016  1  . diphenhydrAMINE (BENADRYL) 25 MG tablet Take 50 mg by mouth 3 (three) times daily as needed for itching or allergies.     Marland Kitchen EPINEPHrine 0.3 mg/0.3 mL IJ SOAJ injection AS DIRECTED AS NEEDED    . fluticasone (FLONASE) 50 MCG/ACT nasal spray Place 1 spray into both nostrils daily.    Marland Kitchen gabapentin (NEURONTIN) 300 MG capsule Take 600 mg by mouth 2 (two) times daily.     Marland Kitchen loratadine (CLARITIN) 10 MG tablet Take 20 mg by mouth daily. As needed for allergy    . losartan (COZAAR) 50 MG tablet Take 1 tablet twice daily 180 tablet 3  . metoprolol tartrate (LOPRESSOR) 25 MG tablet TAKE 1 TABLET BY MOUTH TWO TIMES DAILY 180 tablet 3  . nitroGLYCERIN (NITROSTAT) 0.4 MG SL tablet Place 1 tablet (0.4 mg total) under the tongue every 5 (five) minutes as needed for chest pain. 25 tablet 6  . pantoprazole (PROTONIX) 40 MG tablet TAKE 1 TABLET BY MOUTH EVERY DAY 90 tablet 0  . PREVIDENT 5000 BOOSTER PLUS 1.1 % PSTE Take by mouth as directed. APPLY TO TEETH AT BEDTIME  1  . rosuvastatin (CRESTOR) 20 MG tablet TAKE 1 TABLET BY MOUTH EVERY DAY 90 tablet 1  . sildenafil (VIAGRA) 100 MG tablet Take 1 tablet (100 mg total) by mouth daily as needed for erectile dysfunction. 10 tablet 6  . traMADol (ULTRAM) 50 MG tablet Take 1 tablet (50 mg total) by mouth 3 (three) times daily as needed. for pain 90 tablet 2   No facility-administered medications prior to visit.     No Known Allergies  ROS As per HPI  PE: Blood pressure 115/73, pulse 96, temperature (!) 97.5 F (36.4 C), temperature source Oral, resp. rate 16, height 6\' 2"  (1.88 m), weight 214 lb 6 oz (97.2 kg), SpO2 95 %. Gen: Alert, well appearing.  Patient is oriented to  person, place, time, and situation. AFFECT: pleasant, lucid thought and speech. Sitting SLR + on R-->elicits mild pain in R glut that radiates down hamstring to mid level. DTRs trace in both knees, absent in achilles bilat.  LE strength 5/5 bilat prox/dist.   LABS:  Lab Results  Component Value Date   TSH 1.29 03/22/2014   Lab Results  Component Value Date   WBC 8.2 08/31/2018   HGB 15.3 08/31/2018   HCT 44.4 08/31/2018   MCV 93.2 08/31/2018   PLT 192.0 08/31/2018  Lab Results  Component Value Date   CREATININE 0.95 04/21/2018   BUN 9 04/21/2018   NA 143 04/21/2018   K 4.3 04/21/2018   CL 105 04/21/2018   CO2 28 04/21/2018   Lab Results  Component Value Date   ALT 15 10/06/2017   AST 19 10/06/2017   ALKPHOS 57 10/06/2017   BILITOT 0.8 10/06/2017   Lab Results  Component Value Date   CHOL 125 10/06/2017   Lab Results  Component Value Date   HDL 55 10/06/2017   Lab Results  Component Value Date   LDLCALC 45 10/06/2017   Lab Results  Component Value Date   TRIG 123 10/06/2017   Lab Results  Component Value Date   CHOLHDL 2.3 10/06/2017   Lab Results  Component Value Date   PSA 2.13 03/22/2014   PSA 1.77 02/21/2013   PSA 1.90 02/25/2012   Lab Results  Component Value Date   HGBA1C 5.0 10/22/2017    IMPRESSION AND PLAN:  1) Chronic pain syndrome: chronic LBP and osteoarthritis bilat knees. Tramadol effective and being used responsibly as prescribed. He has a bit of sciatic pain on R.  He says he will set up an appt with Dr. Sherwood Gambler in the future IF it gets worse or if it stays more persistent (for another ESI, which pt states have been very helpful in the past). UDS today. I did electronic rx's for tramadol 50mg , 1 tid prn, #90, RF x 2. CSC is UTD.  An After Visit Summary was printed and given to the patient.  FOLLOW UP: Return in about 3 months (around 01/13/2019) for annual CPE (fasting).  Signed:  Crissie Sickles, MD            10/14/2018

## 2018-10-15 LAB — PAIN MGMT, PROFILE 8 W/CONF, U
6 Acetylmorphine: NEGATIVE ng/mL (ref <10)
Alcohol Metabolites: NEGATIVE ng/mL (ref <500)
Amphetamines: NEGATIVE ng/mL (ref <500)
Benzodiazepines: NEGATIVE ng/mL (ref <100)
Buprenorphine, Urine: NEGATIVE ng/mL (ref <5)
COCAINE METABOLITE: NEGATIVE ng/mL (ref <150)
Creatinine: 110.1 mg/dL
MDMA: NEGATIVE ng/mL (ref <500)
Marijuana Metabolite: NEGATIVE ng/mL (ref <20)
OXYCODONE: NEGATIVE ng/mL (ref <100)
Opiates: NEGATIVE ng/mL (ref <100)
Oxidant: NEGATIVE ug/mL (ref <200)
pH: 6.79 (ref 4.5–9.0)

## 2018-10-19 DIAGNOSIS — J301 Allergic rhinitis due to pollen: Secondary | ICD-10-CM | POA: Diagnosis not present

## 2018-10-19 DIAGNOSIS — J3089 Other allergic rhinitis: Secondary | ICD-10-CM | POA: Diagnosis not present

## 2018-10-27 DIAGNOSIS — J301 Allergic rhinitis due to pollen: Secondary | ICD-10-CM | POA: Diagnosis not present

## 2018-10-27 DIAGNOSIS — J3089 Other allergic rhinitis: Secondary | ICD-10-CM | POA: Diagnosis not present

## 2018-11-02 DIAGNOSIS — J301 Allergic rhinitis due to pollen: Secondary | ICD-10-CM | POA: Diagnosis not present

## 2018-11-02 DIAGNOSIS — J3089 Other allergic rhinitis: Secondary | ICD-10-CM | POA: Diagnosis not present

## 2018-11-08 DIAGNOSIS — J3089 Other allergic rhinitis: Secondary | ICD-10-CM | POA: Diagnosis not present

## 2018-11-08 DIAGNOSIS — J301 Allergic rhinitis due to pollen: Secondary | ICD-10-CM | POA: Diagnosis not present

## 2018-11-14 DIAGNOSIS — J301 Allergic rhinitis due to pollen: Secondary | ICD-10-CM | POA: Diagnosis not present

## 2018-11-14 DIAGNOSIS — J3089 Other allergic rhinitis: Secondary | ICD-10-CM | POA: Diagnosis not present

## 2018-11-16 DIAGNOSIS — J3089 Other allergic rhinitis: Secondary | ICD-10-CM | POA: Diagnosis not present

## 2018-11-16 DIAGNOSIS — J301 Allergic rhinitis due to pollen: Secondary | ICD-10-CM | POA: Diagnosis not present

## 2018-11-21 ENCOUNTER — Other Ambulatory Visit: Payer: Self-pay | Admitting: Internal Medicine

## 2018-11-23 DIAGNOSIS — J301 Allergic rhinitis due to pollen: Secondary | ICD-10-CM | POA: Diagnosis not present

## 2018-11-23 DIAGNOSIS — J3089 Other allergic rhinitis: Secondary | ICD-10-CM | POA: Diagnosis not present

## 2018-11-28 DIAGNOSIS — J3089 Other allergic rhinitis: Secondary | ICD-10-CM | POA: Diagnosis not present

## 2018-11-28 DIAGNOSIS — J301 Allergic rhinitis due to pollen: Secondary | ICD-10-CM | POA: Diagnosis not present

## 2018-12-12 DIAGNOSIS — J301 Allergic rhinitis due to pollen: Secondary | ICD-10-CM | POA: Diagnosis not present

## 2018-12-12 DIAGNOSIS — J3089 Other allergic rhinitis: Secondary | ICD-10-CM | POA: Diagnosis not present

## 2018-12-19 DIAGNOSIS — J301 Allergic rhinitis due to pollen: Secondary | ICD-10-CM | POA: Diagnosis not present

## 2018-12-19 DIAGNOSIS — K219 Gastro-esophageal reflux disease without esophagitis: Secondary | ICD-10-CM | POA: Diagnosis not present

## 2018-12-19 DIAGNOSIS — R21 Rash and other nonspecific skin eruption: Secondary | ICD-10-CM | POA: Diagnosis not present

## 2018-12-19 DIAGNOSIS — J3089 Other allergic rhinitis: Secondary | ICD-10-CM | POA: Diagnosis not present

## 2018-12-22 DIAGNOSIS — J3089 Other allergic rhinitis: Secondary | ICD-10-CM | POA: Diagnosis not present

## 2018-12-22 DIAGNOSIS — J301 Allergic rhinitis due to pollen: Secondary | ICD-10-CM | POA: Diagnosis not present

## 2018-12-27 DIAGNOSIS — J301 Allergic rhinitis due to pollen: Secondary | ICD-10-CM | POA: Diagnosis not present

## 2018-12-27 DIAGNOSIS — J3089 Other allergic rhinitis: Secondary | ICD-10-CM | POA: Diagnosis not present

## 2018-12-30 DIAGNOSIS — J3089 Other allergic rhinitis: Secondary | ICD-10-CM | POA: Diagnosis not present

## 2018-12-30 DIAGNOSIS — J301 Allergic rhinitis due to pollen: Secondary | ICD-10-CM | POA: Diagnosis not present

## 2019-01-09 ENCOUNTER — Encounter: Payer: Self-pay | Admitting: Family Medicine

## 2019-01-09 DIAGNOSIS — J301 Allergic rhinitis due to pollen: Secondary | ICD-10-CM | POA: Diagnosis not present

## 2019-01-09 DIAGNOSIS — J3089 Other allergic rhinitis: Secondary | ICD-10-CM | POA: Diagnosis not present

## 2019-01-12 DIAGNOSIS — J301 Allergic rhinitis due to pollen: Secondary | ICD-10-CM | POA: Diagnosis not present

## 2019-01-12 DIAGNOSIS — J3089 Other allergic rhinitis: Secondary | ICD-10-CM | POA: Diagnosis not present

## 2019-01-13 ENCOUNTER — Encounter: Payer: Medicare HMO | Admitting: Family Medicine

## 2019-01-17 DIAGNOSIS — J3089 Other allergic rhinitis: Secondary | ICD-10-CM | POA: Diagnosis not present

## 2019-01-17 DIAGNOSIS — J301 Allergic rhinitis due to pollen: Secondary | ICD-10-CM | POA: Diagnosis not present

## 2019-01-19 DIAGNOSIS — J3089 Other allergic rhinitis: Secondary | ICD-10-CM | POA: Diagnosis not present

## 2019-01-19 DIAGNOSIS — J301 Allergic rhinitis due to pollen: Secondary | ICD-10-CM | POA: Diagnosis not present

## 2019-01-26 DIAGNOSIS — J3089 Other allergic rhinitis: Secondary | ICD-10-CM | POA: Diagnosis not present

## 2019-01-26 DIAGNOSIS — J301 Allergic rhinitis due to pollen: Secondary | ICD-10-CM | POA: Diagnosis not present

## 2019-01-31 DIAGNOSIS — J301 Allergic rhinitis due to pollen: Secondary | ICD-10-CM | POA: Diagnosis not present

## 2019-01-31 DIAGNOSIS — J3089 Other allergic rhinitis: Secondary | ICD-10-CM | POA: Diagnosis not present

## 2019-02-09 DIAGNOSIS — J301 Allergic rhinitis due to pollen: Secondary | ICD-10-CM | POA: Diagnosis not present

## 2019-02-09 DIAGNOSIS — J3089 Other allergic rhinitis: Secondary | ICD-10-CM | POA: Diagnosis not present

## 2019-02-13 DIAGNOSIS — J3089 Other allergic rhinitis: Secondary | ICD-10-CM | POA: Diagnosis not present

## 2019-02-13 DIAGNOSIS — J301 Allergic rhinitis due to pollen: Secondary | ICD-10-CM | POA: Diagnosis not present

## 2019-02-14 ENCOUNTER — Other Ambulatory Visit: Payer: Self-pay | Admitting: Internal Medicine

## 2019-02-22 DIAGNOSIS — J301 Allergic rhinitis due to pollen: Secondary | ICD-10-CM | POA: Diagnosis not present

## 2019-02-22 DIAGNOSIS — J3089 Other allergic rhinitis: Secondary | ICD-10-CM | POA: Diagnosis not present

## 2019-02-23 ENCOUNTER — Telehealth: Payer: Self-pay | Admitting: Gastroenterology

## 2019-02-23 MED ORDER — PANTOPRAZOLE SODIUM 40 MG PO TBEC: 40.0000 mg | DELAYED_RELEASE_TABLET | Freq: Every day | ORAL | 0 refills | Status: DC

## 2019-02-23 NOTE — Telephone Encounter (Signed)
Script sent  

## 2019-02-23 NOTE — Telephone Encounter (Signed)
Pt just booked appt with Dr. Havery Moros for 6/23 but is requesting rf for protonix [facility] [place].

## 2019-03-02 DIAGNOSIS — J301 Allergic rhinitis due to pollen: Secondary | ICD-10-CM | POA: Diagnosis not present

## 2019-03-02 DIAGNOSIS — J3089 Other allergic rhinitis: Secondary | ICD-10-CM | POA: Diagnosis not present

## 2019-03-03 ENCOUNTER — Other Ambulatory Visit: Payer: Self-pay | Admitting: Cardiology

## 2019-03-06 ENCOUNTER — Telehealth: Payer: Self-pay

## 2019-03-06 NOTE — Telephone Encounter (Signed)
Left message for patient to call back to change upcoming appointment with Dr Radford Pax to virtual visit and find out if he would like video or telephone. Need to get verbal consent.

## 2019-03-08 DIAGNOSIS — J301 Allergic rhinitis due to pollen: Secondary | ICD-10-CM | POA: Diagnosis not present

## 2019-03-08 DIAGNOSIS — J3089 Other allergic rhinitis: Secondary | ICD-10-CM | POA: Diagnosis not present

## 2019-03-08 NOTE — Progress Notes (Signed)
Virtual Visit via Telephone Note   This visit type was conducted due to national recommendations for restrictions regarding the COVID-19 Pandemic (e.g. social distancing) in an effort to limit this patient's exposure and mitigate transmission in our community.  Due to his co-morbid illnesses, this patient is at least at moderate risk for complications without adequate follow up.  This format is felt to be most appropriate for this patient at this time.  The patient did not have access to video technology/had technical difficulties with video requiring transitioning to audio format only (telephone).  All issues noted in this document were discussed and addressed.  No physical exam could be performed with this format.  Please refer to the patient's chart for his  consent to telehealth for [facility].   Evaluation Performed:  Follow-up visit  This visit type was conducted due to national recommendations for restrictions regarding the COVID-19 Pandemic (e.g. social distancing).  This format is felt to be most appropriate for this patient at this time.  All issues noted in this document were discussed and addressed.  No physical exam was performed (except for noted visual exam findings with Video Visits).  Please refer to the patient's chart (MyChart message for video visits and phone note for telephone visits) for the patient's consent to telehealth for [facility].  Date:  03/09/2019   ID:  DRAKO MAESE, DOB 01/30/1941, MRN [id number]  Patient Location:  [facility]  Provider location:   [place]  PCP:  Tammi Sou, MD  Cardiologist:  Fransico Him, MD Electrophysiologist:  None   Chief Complaint:  CAD, HTN, lipids  History of Present Illness:    TODD JELINSKI is a 78 y.o. male who presents via audio/video conferencing for a telehealth visit today.    Brandon Haynes is a 79 y.o. male with a hx of 2 vesselASCAD with a 99% LAD and an occluded RCA. The RCA was felt well collateralized  and heunderwent PCI of the LAD. LV function was 55% at time of cath. Echocardiogram revealed akinesis of the basal-mid inferoseptal myocardium and mid-apical anteroseptal myocardium. His last nuclear stress test showed no ischemia. His angina has presented as CP and hypotension in the past.   He is here today for followup and is doing well.  He denies any significant chest pain or pressure, SOB, DOE, PND, orthopnea, dizziness, palpitations or syncope. He occasionally has some LE edema in his ankles if he sits for a long period of time.  He has only taken 1 SL NTG in the past month but thinks it may have been gas.  He has only take NTG 3 times in the past year. He is compliant with his meds and is tolerating meds with no SE.    The patient does not have symptoms concerning for COVID-19 infection (fever, chills, cough, or new shortness of breath).    Prior CV studies:   The following studies were reviewed today:  none  Past Medical History:  Diagnosis Date  . Allergic rhinitis    allergy shots via [place]  . Anxiety   . 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 CAD, multiple vessel    a. CP/near-syncope 11/2014 s/p DES to MLAD, occluded RCA after takeoff of RV marginal with collaterals treated medically, otherwise mild nonobstructive disease. EF 55%;  b. 07/2015 Lexiscan CL: EF 56%, no ischemia/infarct.  Remains on DAPT as of 03/2017 cards f/u.  Marland Kitchen Chronic low back pain   . DDD (degenerative disc disease), lumbar   .  GERD (gastroesophageal reflux disease)    PPI bid needed to control sx's  . Gout   . Hearing loss   . History  of basal cell carcinoma   . History of adenomatous polyp of colon   . Hypercholesteremia   . Hypertension    Hx labile/uncontrolled.  Improved s/p visit to HTN clinic 2019.  . Iron deficiency anemia 2017   Colonoscopy, EGD, and capsule endoscopy w/out obvious cause/source found.  Oral iron helping as of 03/2016.  . Leg pain 03/06/2016  . Nonmelanoma skin cancer    . 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 Peripheral vascular disease ([place])   . Pleural thickening    chronic  . Prediabetes    HbA1c 6 % 09/2015.  . Wheezing    Allergist has him on prn albuterol HFA   Past Surgical History:  Procedure Laterality Date  . back injection  oct 2011, 07/2010, 12/2012   no surgery  . Capsule endoscopy  02/2016   mild duodenitis, o/w NEG  . CARDIOVASCULAR STRESS TEST  05/2009; 07/2015  . carotid dopplers  11/2014   Bilateral - 1% to 9% ICA stenosis. Vertebral artery flow is  . COLONOSCOPY  08/2008; 02/2016; 02/2017   2017: 12 polyps (adenomatous).  02/2017 repeat TCS--multiple polyps: recall 3 yrs.  . CORONARY ANGIOPLASTY WITH STENT PLACEMENT  12/04/2014   4.0 x 12 mm Synergy stent to the mid LAD  . ESOPHAGOGASTRODUODENOSCOPY  02/2016   Gastritis.  NEG h pylori.    . LE arterial vascular study  02/2016   Per Dr. Radford Pax, no evidence of peripheral vascular disease  . LEFT HEART CATHETERIZATION WITH CORONARY ANGIOGRAM N/A 12/04/2014   Procedure: LEFT HEART CATHETERIZATION WITH CORONARY ANGIOGRAM;  Surgeon: Jolaine Artist, MD; LAD 99% then aneurysmal, OM1 40%, CFX 50%, RCA 40%, 50% then occluded  . ROTATOR CUFF REPAIR Left 09/2013   left  . SHOULDER INJECTION  feb 2014  . TRANSTHORACIC ECHOCARDIOGRAM  11/2014   EF 50-55%, some wall motion abnormalities noted, grade I diast dysfxn     Current Meds  Medication Sig  . acetaminophen (TYLENOL) 500 MG tablet Take 500 mg by mouth every 6 (six) hours as needed for moderate pain.  Marland Kitchen allopurinol (ZYLOPRIM) 300 MG tablet TAKE 1 TABLET BY MOUTH EVERY DAY  . amLODipine (NORVASC) 5 MG tablet TAKE 1 TABLET BY MOUTH EVERY DAY  . aspirin 81 MG tablet Take 81 mg by mouth daily.    . clotrimazole-betamethasone (LOTRISONE) cream APPLY TO AFFECTED AREA 1-2 TIMES DAILY  . desonide (DESOWEN) 0.05 % cream Apply 1 application topically daily. Reported on 02/27/2016  . diphenhydrAMINE (BENADRYL) 25 MG tablet Take 50 mg by mouth  3 (three) times daily as needed for itching or allergies.   Marland Kitchen EPINEPHrine 0.3 mg/0.3 mL IJ SOAJ injection AS DIRECTED AS NEEDED  . fluticasone (FLONASE) 50 MCG/ACT nasal spray Place 1 spray into both nostrils daily.  Marland Kitchen gabapentin (NEURONTIN) 300 MG capsule Take 600 mg by mouth 2 (two) times daily.   Marland Kitchen loratadine (CLARITIN) 10 MG tablet Take 20 mg by mouth daily. As needed for allergy  . losartan (COZAAR) 100 MG tablet Take 0.5 tablets (50 mg total) by mouth 2 (two) times daily. Please keep upcoming appt for future refills. Thank you  . metoprolol tartrate (LOPRESSOR) 25 MG tablet Take 1 tablet (25 mg total) by mouth 2 (two) times daily.  . nitroGLYCERIN (NITROSTAT) 0.4 MG SL tablet Place 1 tablet (0.4 mg total) under the  tongue every 5 (five) minutes as needed for chest pain.  . pantoprazole (PROTONIX) 40 MG tablet Take 1 tablet (40 mg total) by mouth daily.  Marland Kitchen PREVIDENT 5000 BOOSTER PLUS 1.1 % PSTE Take by mouth as directed. APPLY TO TEETH AT BEDTIME  . rosuvastatin (CRESTOR) 20 MG tablet TAKE 1 TABLET BY MOUTH EVERY DAY  . sildenafil (VIAGRA) 100 MG tablet Take 1 tablet (100 mg total) by mouth daily as needed for erectile dysfunction.  . traMADol (ULTRAM) 50 MG tablet Take 1 tablet (50 mg total) by mouth 3 (three) times daily as needed. for pain  . [DISCONTINUED] metoprolol tartrate (LOPRESSOR) 25 MG tablet TAKE 1 TABLET BY MOUTH TWO TIMES DAILY     Allergies:   Patient has no known allergies.   Social History   Tobacco Use  . Smoking status: Former Smoker    Packs/day: 2.00    Years: 35.00    Pack years: 70.00    Types: Cigarettes    Quit date: 09/28/1998    Years since quitting: 20.4  . Smokeless tobacco: Former Systems developer    Types: Snuff    Quit date: 02/09/1986  . Tobacco comment: as a teenager  Substance Use Topics  . Alcohol use: No    Alcohol/week: 0.0 standard drinks    Comment: quit 1994  . Drug use: No     Family Hx: The patient's family history includes Breast cancer in  his mother and sister; COPD in his brother; Congestive Heart Failure in his mother; Diabetes in his mother; Heart disease in his brother and father; Lung cancer in his father. There is no history of Colon cancer, Esophageal cancer, Pancreatic cancer, Rectal cancer, Stomach cancer, or Prostate cancer.  ROS:   Please see the history of present illness.     All other systems reviewed and are negative.   Labs/Other Tests and Data Reviewed:    Recent Labs: 04/21/2018: BUN 9; Creat 0.95; Potassium 4.3; Sodium 143 08/31/2018: Hemoglobin 15.3; Platelets 192.0   Recent Lipid Panel Lab Results  Component Value Date/Time   CHOL 125 10/06/2017 08:54 AM   TRIG 123 10/06/2017 08:54 AM   HDL 55 10/06/2017 08:54 AM   CHOLHDL 2.3 10/06/2017 08:54 AM   CHOLHDL 2.5 10/20/2016 10:17 AM   LDLCALC 45 10/06/2017 08:54 AM    Wt Readings from Last 3 Encounters:  03/09/19 215 lb 12.8 oz (97.9 kg)  10/14/18 214 lb 6 oz (97.2 kg)  08/05/18 209 lb (94.8 kg)     Objective:    Vital Signs:  BP 109/60   Pulse (!) 56   Ht 6\' 2"  (1.88 m)   Wt 215 lb 12.8 oz (97.9 kg)   BMI 27.71 kg/m     ASSESSMENT & PLAN:    1.  ASCAD -  Cath 2016 showed 2 vesselASCAD with a 99% LAD and an occluded RCA. The RCA was felt well collateralized and heunderwent PCI of the LAD.  His anginal equivalent is hypotension and CP in the past.  He will continue on ASA 81mg  daily, statin and BB.  2.  Hypertension - his BP has been controlled.  He will continue on Losartan 50mg  BID, Lopressor 25mg  BID and amlodipine 5mg  daily.  His last creatinine was 0.95 a year ago.  I will repeat a BMET.  3.  Hyperlipidemia - his LDL goal is < 70.  He will continue on Crestor 20mg  daily.  His last LDL was 45 a year ago.  I will check an  FLP and ALT.    COVID-19 Education: The signs and symptoms of COVID-19 were discussed with the patient and how to seek care for testing (follow up with PCP or arrange E-visit).  The importance of social distancing  was discussed today.  Patient Risk:   After full review of this patient's clinical status, I feel that they are at least moderate risk at this time.  Time:   Today, I have spent 12 minutes directly with the patient on telephone discussing medical problems including CAD, HTN, lipids.  We also reviewed the symptoms of COVID 19 and the ways to protect against contracting the virus with telehealth technology.  I spent an additional 5 minutes reviewing patient's chart including labs.  Medication Adjustments/Labs and Tests Ordered: Current medicines are reviewed at length with the patient today.  Concerns regarding medicines are outlined above.  Tests Ordered: No orders of the defined types were placed in this encounter.  Medication Changes: Meds ordered this encounter  Medications  . metoprolol tartrate (LOPRESSOR) 25 MG tablet    Sig: Take 1 tablet (25 mg total) by mouth 2 (two) times daily.    Dispense:  180 tablet    Refill:  3    Disposition:  Follow up in 1 year(s)  Signed, Fransico Him, MD  03/09/2019 9:20 AM    [facility]

## 2019-03-09 ENCOUNTER — Other Ambulatory Visit: Payer: Self-pay

## 2019-03-09 ENCOUNTER — Other Ambulatory Visit: Payer: Self-pay | Admitting: Cardiology

## 2019-03-09 ENCOUNTER — Encounter: Payer: Self-pay | Admitting: Cardiology

## 2019-03-09 ENCOUNTER — Telehealth: Payer: Self-pay

## 2019-03-09 ENCOUNTER — Telehealth (INDEPENDENT_AMBULATORY_CARE_PROVIDER_SITE_OTHER): Payer: Medicare HMO | Admitting: Cardiology

## 2019-03-09 VITALS — BP 109/60 | HR 56 | Ht 74.0 in | Wt 215.8 lb

## 2019-03-09 DIAGNOSIS — I10 Essential (primary) hypertension: Secondary | ICD-10-CM | POA: Diagnosis not present

## 2019-03-09 DIAGNOSIS — E7800 Pure hypercholesterolemia, unspecified: Secondary | ICD-10-CM

## 2019-03-09 DIAGNOSIS — I2510 Atherosclerotic heart disease of native coronary artery without angina pectoris: Secondary | ICD-10-CM | POA: Diagnosis not present

## 2019-03-09 MED ORDER — METOPROLOL TARTRATE 25 MG PO TABS: 25.0000 mg | ORAL_TABLET | Freq: Two times a day (BID) | ORAL | 3 refills | Status: DC

## 2019-03-09 NOTE — Telephone Encounter (Signed)

## 2019-03-09 NOTE — Patient Instructions (Signed)
Medication Instructions:  Your physician recommends that you continue on your current medications as directed. Please refer to the Current Medication list given to you today.  If you need a refill on your cardiac medications before your next appointment, please call your pharmacy.   Lab work: Fasting Labs: BMET, Lipid and Liver, call the office to schedule.  If you have labs (blood work) drawn today and your tests are completely normal, you will receive your results only by: Marland Kitchen MyChart Message (if you have MyChart) OR . A paper copy in the mail If you have any lab test that is abnormal or we need to change your treatment, we will call you to review the results.  Testing/Procedures: None  Follow-Up: At [facility], you and your health needs are our priority.  As part of our continuing mission to provide you with exceptional heart care, we have created designated Provider Care Teams.  These Care Teams include your primary Cardiologist (physician) and Advanced Practice Providers (APPs -  Physician Assistants and Nurse Practitioners) who all work together to provide you with the care you need, when you need it. You will need a follow up appointment in 1 years.  Please call our office 2 months in advance to schedule this appointment.  You may see Dr. Radford Pax or one of the following Advanced Practice Providers on your designated Care Team:   [place], PA-C Melina Copa, PA-C . Ermalinda Barrios, PA-C

## 2019-03-10 ENCOUNTER — Ambulatory Visit: Payer: Medicare HMO | Admitting: Cardiology

## 2019-03-10 ENCOUNTER — Telehealth: Payer: Self-pay

## 2019-03-10 NOTE — Telephone Encounter (Signed)
    COVID-19 Pre-Screening Questions:  . In the past 7 to 10 days have you had a cough,  shortness of breath, headache, congestion, fever (100 or greater) body aches, chills, sore throat, or sudden loss of taste or sense of smell? . Have you been around anyone with known Covid 19. . Have you been around anyone who is awaiting Covid 19 test results in the past 7 to 10 days? . Have you been around anyone who has been exposed to Covid 19, or has mentioned symptoms of Covid 19 within the past 7 to 10 days?  If you have any concerns/questions about symptoms patients report during screening (either on the phone or at threshold). Contact the provider seeing the patient or DOD for further guidance.  If neither are available contact a member of the leadership team.          Pt answered NO to all pre-screening questions. [id number] 03/10/2019

## 2019-03-13 ENCOUNTER — Other Ambulatory Visit: Payer: Medicare HMO | Admitting: *Deleted

## 2019-03-13 ENCOUNTER — Other Ambulatory Visit: Payer: Self-pay

## 2019-03-13 DIAGNOSIS — E7800 Pure hypercholesterolemia, unspecified: Secondary | ICD-10-CM | POA: Diagnosis not present

## 2019-03-13 DIAGNOSIS — I2510 Atherosclerotic heart disease of native coronary artery without angina pectoris: Secondary | ICD-10-CM

## 2019-03-13 DIAGNOSIS — J3089 Other allergic rhinitis: Secondary | ICD-10-CM | POA: Diagnosis not present

## 2019-03-13 DIAGNOSIS — J301 Allergic rhinitis due to pollen: Secondary | ICD-10-CM | POA: Diagnosis not present

## 2019-03-14 LAB — LIPID PANEL
Chol/HDL Ratio: 2.2 ratio (ref 0.0–5.0)
Cholesterol, Total: 119 mg/dL (ref 100–199)
HDL: 54 mg/dL (ref >39)
LDL Calculated: 44 mg/dL (ref 0–99)
Triglycerides: 106 mg/dL (ref 0–149)
VLDL Cholesterol Cal: 21 mg/dL (ref 5–40)

## 2019-03-14 LAB — HEPATIC FUNCTION PANEL
ALT: 12 IU/L (ref 0–44)
AST: 20 IU/L (ref 0–40)
Albumin: 4.7 g/dL (ref 3.7–4.7)
Alkaline Phosphatase: 47 IU/L (ref 39–117)
Bilirubin Total: 0.7 mg/dL (ref 0.0–1.2)
Bilirubin, Direct: 0.19 mg/dL (ref 0.00–0.40)
Total Protein: 6.6 g/dL (ref 6.0–8.5)

## 2019-03-14 LAB — BASIC METABOLIC PANEL
BUN/Creatinine Ratio: 10 (ref 10–24)
BUN: 11 mg/dL (ref 8–27)
CO2: 25 mmol/L (ref 20–29)
Calcium: 9.2 mg/dL (ref 8.6–10.2)
Chloride: 105 mmol/L (ref 96–106)
Creatinine, Ser: 1.1 mg/dL (ref 0.76–1.27)
GFR calc Af Amer: 75 mL/min/{1.73_m2} (ref >59)
GFR calc non Af Amer: 65 mL/min/{1.73_m2} (ref >59)
Glucose: 90 mg/dL (ref 65–99)
Potassium: 4.4 mmol/L (ref 3.5–5.2)
Sodium: 143 mmol/L (ref 134–144)

## 2019-03-17 ENCOUNTER — Other Ambulatory Visit: Payer: Self-pay | Admitting: Cardiology

## 2019-03-20 NOTE — Progress Notes (Signed)
Prescreened pt for 6-23 visit

## 2019-03-21 ENCOUNTER — Encounter: Payer: Self-pay | Admitting: Gastroenterology

## 2019-03-21 ENCOUNTER — Ambulatory Visit (INDEPENDENT_AMBULATORY_CARE_PROVIDER_SITE_OTHER): Payer: Medicare HMO | Admitting: Gastroenterology

## 2019-03-21 VITALS — Ht 74.0 in | Wt 215.0 lb

## 2019-03-21 DIAGNOSIS — Z86010 Personal history of colonic polyps: Secondary | ICD-10-CM | POA: Diagnosis not present

## 2019-03-21 DIAGNOSIS — Z8639 Personal history of other endocrine, nutritional and metabolic disease: Secondary | ICD-10-CM | POA: Diagnosis not present

## 2019-03-21 DIAGNOSIS — K219 Gastro-esophageal reflux disease without esophagitis: Secondary | ICD-10-CM | POA: Diagnosis not present

## 2019-03-21 MED ORDER — PANTOPRAZOLE SODIUM 40 MG PO TBEC: 40.0000 mg | DELAYED_RELEASE_TABLET | Freq: Every day | ORAL | 3 refills | Status: DC

## 2019-03-21 NOTE — Progress Notes (Signed)
THIS ENCOUNTER IS A VIRTUAL VISIT DUE TO COVID-19 - PATIENT WAS NOT SEEN IN THE OFFICE. PATIENT HAS CONSENTED TO VIRTUAL VISIT / TELEMEDICINE VISIT USING TELEPHONE ONLY   Location of patient: home Location of provider: office Persons participating: myself, patient Time spent on call:  15 minutes   HPI :  78 y/o male here for a follow up visit. He previously was seen in 2017 for iron deficiency that occurred in the setting of aspirin and plavix. He underwent workup as outlined below, he had numerous polyps removed, mild gastritis / duodenitis, otherwise nothing significant noted. His iron stores and Hgb normalized on iron. He eventually stopped plavix about a year ago and came off iron as well. He takes aspirin 81mg  / day, his Hgb has been normal for the past few years, last check Dec 2019 and Hgb was 15.3  He has no trouble with his bowel habits. No blood in the stools. He denies NSAID use.  He is takes protonix 40mg , working well for him for the most part for reflux. He takes it once daily. He has had some breakthrough perhaps once per week. He uses TUMS PRN for breakthrough which works well for him. He previously did not have good results at 20mg  / day, prefers to take higher dosing.   Otherwise he was offered genetic testing for the amount of polyps removed previously, which he declined. He is next due for colonoscopy next year.   Endoscopic history Colonoscopy 2009 - normal exam, "excellent prep" Colonoscopy 2004 - one diminutive adenoma Colonoscopy 02/27/2016 - 12 adenomatous polyps, diverticulosis, normal ileum EGD 02/27/2016 - normal esophagus, mild gastritis, gastric polyp - HP negative Capsule endoscopy - 03/18/2016 - small erosion, duodenitis, otherwise normal Colonoscopy 03/11/2017 - 7 small polyps, diverticulosis, adneomas / sessile serrated / hyperplastic - recall in 3 years   Past Medical History:  Diagnosis Date  . Allergic rhinitis    allergy shots via [place]  . Anxiety    . 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 CAD, multiple vessel    a. CP/near-syncope 11/2014 s/p DES to MLAD, occluded RCA after takeoff of RV marginal with collaterals treated medically, otherwise mild nonobstructive disease. EF 55%;  b. 07/2015 Lexiscan CL: EF 56%, no ischemia/infarct.  Remains on DAPT as of 03/2017 cards f/u.  Marland Kitchen Chronic low back pain   . DDD (degenerative disc disease), lumbar   . GERD (gastroesophageal reflux disease)    PPI bid needed to control sx's  . Gout   . Hearing loss   . History  of basal cell carcinoma   . History of adenomatous polyp of colon   . Hypercholesteremia   . Hypertension    Hx labile/uncontrolled.  Improved s/p visit to HTN clinic 2019.  . Iron deficiency anemia 2017   Colonoscopy, EGD, and capsule endoscopy w/out obvious cause/source found.  Oral iron helping as of 03/2016.  . Leg pain 03/06/2016  . Nonmelanoma skin cancer   . 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 Peripheral vascular disease ([place])   . Pleural thickening    chronic  . Prediabetes    HbA1c 6 % 09/2015.  . Wheezing    Allergist has him on prn albuterol HFA     Past Surgical History:  Procedure Laterality Date  . back injection  oct 2011, 07/2010, 12/2012   no surgery  . Capsule endoscopy  02/2016   mild duodenitis, o/w NEG  . CARDIOVASCULAR STRESS TEST  05/2009; 07/2015  . carotid dopplers  11/2014   Bilateral - 1% to 9% ICA stenosis. Vertebral artery flow is  . COLONOSCOPY  08/2008; 02/2016; 02/2017   2017: 12 polyps (adenomatous).  02/2017 repeat TCS--multiple polyps: recall 3 yrs.  . CORONARY ANGIOPLASTY WITH STENT PLACEMENT  12/04/2014   4.0 x 12 mm Synergy stent to the mid LAD  . ESOPHAGOGASTRODUODENOSCOPY  02/2016   Gastritis.  NEG h pylori.    . LE arterial vascular study  02/2016   Per Dr. Radford Pax, no evidence of peripheral vascular disease  . LEFT HEART CATHETERIZATION WITH CORONARY ANGIOGRAM N/A 12/04/2014   Procedure: LEFT HEART CATHETERIZATION WITH CORONARY  ANGIOGRAM;  Surgeon: Jolaine Artist, MD; LAD 99% then aneurysmal, OM1 40%, CFX 50%, RCA 40%, 50% then occluded  . ROTATOR CUFF REPAIR Left 09/2013   left  . SHOULDER INJECTION  feb 2014  . TRANSTHORACIC ECHOCARDIOGRAM  11/2014   EF 50-55%, some wall motion abnormalities noted, grade I diast dysfxn   Family History  Problem Relation Age of Onset  . Lung cancer Father   . Heart disease Father   . Breast cancer Mother   . Congestive Heart Failure Mother   . Diabetes Mother   . Breast cancer Sister   . Heart disease Brother   . COPD Brother   . Colon cancer Neg Hx   . Esophageal cancer Neg Hx   . Pancreatic cancer Neg Hx   . Rectal cancer Neg Hx   . Stomach cancer Neg Hx   . Prostate cancer Neg Hx    Social History   Tobacco Use  . Smoking status: Former Smoker    Packs/day: 2.00    Years: 35.00    Pack years: 70.00    Types: Cigarettes    Quit date: 09/28/1998    Years since quitting: 20.4  . Smokeless tobacco: Former Systems developer    Types: Snuff    Quit date: 02/09/1986  . Tobacco comment: as a teenager  Substance Use Topics  . Alcohol use: No    Alcohol/week: 0.0 standard drinks    Comment: quit 1994  . Drug use: No   Current Outpatient Medications  Medication Sig Dispense Refill  . acetaminophen (TYLENOL) 500 MG tablet Take 500 mg by mouth every 6 (six) hours as needed for moderate pain.    Marland Kitchen allopurinol (ZYLOPRIM) 300 MG tablet TAKE 1 TABLET BY MOUTH EVERY DAY 90 tablet 3  . amLODipine (NORVASC) 5 MG tablet TAKE 1 TABLET BY MOUTH EVERY DAY 90 tablet 1  . aspirin 81 MG tablet Take 81 mg by mouth daily.      Marland Kitchen desonide (DESOWEN) 0.05 % cream Apply 1 application topically daily. Reported on 02/27/2016  1  . diphenhydrAMINE (BENADRYL) 25 MG tablet Take 50 mg by mouth 3 (three) times daily as needed for itching or allergies.     Marland Kitchen EPINEPHrine 0.3 mg/0.3 mL IJ SOAJ injection AS DIRECTED AS NEEDED    . fluticasone (FLONASE) 50 MCG/ACT nasal spray Place 1 spray into both nostrils  daily.    Marland Kitchen gabapentin (NEURONTIN) 300 MG capsule Take 600 mg by mouth 2 (two) times daily.     Marland Kitchen loratadine (CLARITIN) 10 MG tablet Take 20 mg by mouth daily. As needed for allergy    . losartan (COZAAR) 50 MG tablet TAKE 1 TABLET BY MOUTH TWICE A DAY 60 tablet 11  . metoprolol tartrate (LOPRESSOR) 25 MG tablet Take 1 tablet (25 mg total) by mouth 2 (two) times daily. 180 tablet 3  .  nitroGLYCERIN (NITROSTAT) 0.4 MG SL tablet Place 1 tablet (0.4 mg total) under the tongue every 5 (five) minutes as needed for chest pain. 25 tablet 6  . pantoprazole (PROTONIX) 40 MG tablet Take 1 tablet (40 mg total) by mouth daily. 90 tablet 0  . PREVIDENT 5000 BOOSTER PLUS 1.1 % PSTE Take by mouth as directed. APPLY TO TEETH AT BEDTIME  1  . rosuvastatin (CRESTOR) 20 MG tablet TAKE 1 TABLET BY MOUTH EVERY DAY 90 tablet 1  . sildenafil (VIAGRA) 100 MG tablet Take 1 tablet (100 mg total) by mouth daily as needed for erectile dysfunction. 10 tablet 6  . traMADol (ULTRAM) 50 MG tablet Take 1 tablet (50 mg total) by mouth 3 (three) times daily as needed. for pain 90 tablet 2   No current facility-administered medications for this visit.    No Known Allergies   Review of Systems: All systems reviewed and negative except where noted in HPI.   Lab Results  Component Value Date   WBC 8.2 08/31/2018   HGB 15.3 08/31/2018   HCT 44.4 08/31/2018   MCV 93.2 08/31/2018   PLT 192.0 08/31/2018    CBC Latest Ref Rng & Units 08/31/2018 09/02/2017 12/21/2016  WBC 4.0 - 10.5 K/uL 8.2 9.2 8.4  Hemoglobin 13.0 - 17.0 g/dL 15.3 14.9 16.0  Hematocrit 39.0 - 52.0 % 44.4 43.4 47.6  Platelets 150.0 - 400.0 K/uL 192.0 190.0 196.0     Physical Exam: Ht 6\' 2"  (1.88 m) Comment: pt provided over the phone  Wt 215 lb (97.5 kg) Comment: pt provided over the phone  BMI 27.60 kg/m  NA   ASSESSMENT AND PLAN: 78 y/o male here for reassessment of the following issues:  GERD - well controlled at protonix 40mg  / day. He did not  do nearly as well on protonix 20mg  / day. EGD without Barrett's. I have discussed long term risks / benefits of chronic PPI use, he understands and wishes to continue it, his renal function is normal. Refilled protonix  History of iron deficiency - workup as above, nothing too concerning. Normalized with oral iron. He eventually came off plavix and iron and his Hgb has remained normal. Would recheck in December, one year from last draw.   History of colon polyps - numerous pre-cancerous polyps on his last 2 colonoscopies, he has declined genetic testing. He does wish to continue with surveillance, next due 02/2020. If there are no high risk lesions on that exam will likely not pursue further colonoscopy exams.  Boyd Cellar, MD [facility]  Total time spent 15 minutes on call with patient and coordinating care

## 2019-03-21 NOTE — Patient Instructions (Addendum)
If you are age 76 or older, your body mass index should be between 23-30. Your Body mass index is 27.6 kg/m. If this is out of the aforementioned range listed, please consider follow up with your Primary Care Provider.  If you are age 32 or younger, your body mass index should be between 19-25. Your Body mass index is 27.6 kg/m. If this is out of the aformentioned range listed, please consider follow up with your Primary Care Provider.   To help prevent the possible spread of infection to our patients, communities, and staff; we will be implementing the following measures:  As of now we are not allowing any visitors/family members to accompany you to any upcoming appointments with [facility]. If you have any concerns about this please contact our office to discuss prior to the appointment.   Please go to the lab in the basement of our building to have lab work done in December 2020. Hit "B" for basement when you get on the elevator.  When the doors open the lab is on your left.  We will call you with the results. Thank you.  We have sent the following medications to your pharmacy for you to pick up at your convenience: Protonix 40 mg daily.  You will be due for a recall colonoscopy in June 2021. We will send you a reminder in the mail when it gets closer to that time.

## 2019-03-23 DIAGNOSIS — J3089 Other allergic rhinitis: Secondary | ICD-10-CM | POA: Diagnosis not present

## 2019-03-23 DIAGNOSIS — J301 Allergic rhinitis due to pollen: Secondary | ICD-10-CM | POA: Diagnosis not present

## 2019-04-02 ENCOUNTER — Other Ambulatory Visit: Payer: Self-pay | Admitting: Family Medicine

## 2019-04-03 DIAGNOSIS — J3089 Other allergic rhinitis: Secondary | ICD-10-CM | POA: Diagnosis not present

## 2019-04-03 DIAGNOSIS — J301 Allergic rhinitis due to pollen: Secondary | ICD-10-CM | POA: Diagnosis not present

## 2019-04-03 NOTE — Telephone Encounter (Signed)
RF request for tramadol. Last OV 10/14/2018 No upcoming OV Last RX 10/14/2018 # 90 x 2 rfs.  Please advise.

## 2019-04-03 NOTE — Telephone Encounter (Signed)
I'll send in #20 pills of tramadol. He is overdue for follow up--I need to see him in the office every 3 months when he is on this med (as per state medical board rules and the controlled substance contract he signed). Arrange f/u chronic pain before he runs out of the #20 tabs I send in now.-thx

## 2019-04-04 NOTE — Telephone Encounter (Signed)
OK, noted

## 2019-04-04 NOTE — Telephone Encounter (Signed)
Patient has CPE tomorrow.

## 2019-04-05 ENCOUNTER — Ambulatory Visit (INDEPENDENT_AMBULATORY_CARE_PROVIDER_SITE_OTHER): Payer: Medicare HMO | Admitting: Family Medicine

## 2019-04-05 ENCOUNTER — Other Ambulatory Visit: Payer: Self-pay

## 2019-04-05 ENCOUNTER — Encounter: Payer: Self-pay | Admitting: Family Medicine

## 2019-04-05 VITALS — BP 114/70 | HR 50 | Temp 98.0°F | Resp 16 | Ht 74.0 in | Wt 217.4 lb

## 2019-04-05 DIAGNOSIS — M150 Primary generalized (osteo)arthritis: Secondary | ICD-10-CM

## 2019-04-05 DIAGNOSIS — Z862 Personal history of diseases of the blood and blood-forming organs and certain disorders involving the immune mechanism: Secondary | ICD-10-CM | POA: Diagnosis not present

## 2019-04-05 DIAGNOSIS — M545 Low back pain, unspecified: Secondary | ICD-10-CM

## 2019-04-05 DIAGNOSIS — Z0000 Encounter for general adult medical examination without abnormal findings: Secondary | ICD-10-CM | POA: Diagnosis not present

## 2019-04-05 DIAGNOSIS — G894 Chronic pain syndrome: Secondary | ICD-10-CM

## 2019-04-05 DIAGNOSIS — R7303 Prediabetes: Secondary | ICD-10-CM

## 2019-04-05 DIAGNOSIS — G8929 Other chronic pain: Secondary | ICD-10-CM | POA: Diagnosis not present

## 2019-04-05 DIAGNOSIS — M159 Polyosteoarthritis, unspecified: Secondary | ICD-10-CM

## 2019-04-05 DIAGNOSIS — M792 Neuralgia and neuritis, unspecified: Secondary | ICD-10-CM | POA: Diagnosis not present

## 2019-04-05 DIAGNOSIS — M8949 Other hypertrophic osteoarthropathy, multiple sites: Secondary | ICD-10-CM

## 2019-04-05 MED ORDER — AMLODIPINE BESYLATE 5 MG PO TABS: 5.0000 mg | ORAL_TABLET | Freq: Every day | ORAL | 1 refills | Status: DC

## 2019-04-05 MED ORDER — TRAMADOL HCL 50 MG PO TABS: 50.0000 mg | ORAL_TABLET | Freq: Three times a day (TID) | ORAL | 5 refills | Status: DC | PRN

## 2019-04-05 NOTE — Progress Notes (Signed)
Office Note 04/05/2019  CC:  Chief Complaint  Patient presents with  . Annual Exam    pt is fasting    HPI:  Brandon Haynes is a 78 y.o. White male who is here for annual health maintenance exam and f/u chronic pain syndrome. He saw his cardiologist for routine f/u 03/09/19 and all was stable.  He had labs done 03/13/19 (CMET and FLP) and these were normal/at goal).   Indication for chronic opioid: chronic low back pain (DDD) and bilat LL neuropathic pain. Opioids r'xd to maximize functioning and quality of life. Pt has gotten periodic ESI but has no hx of back surgery Medication and dose: tramadol 50mg , 1tid prn # pills per month: 90 Last UDS date: 10/14/18 Opioid Treatment Agreement signed (Y/N): Y, 07/22/18 Opioid Treatment Agreement last reviewed with patient:  today [place] reviewed this encounter (include red flags):  today, no red flags. Most recent tramadol fill date is 02/21/19 (#90 tabs).  LL pain--suspected neuropathy pain-- more bothersome, back pain getting gradually worse and he may be going to get a back injection pretty soon. He takes 1-3 tramadol per day, rarely takes more than 1 at a time.  Takes tylenol sometimes as well. No side effects from them.  He feels well and has no acute complaints today. Staying active as far as his back pain and leg pain will allow. Diet is fair.  Past Medical History:  Diagnosis Date  . Allergic rhinitis    allergy shots via [place]  . Anxiety   . 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 CAD, multiple vessel    a. CP/near-syncope 11/2014 s/p DES to MLAD, occluded RCA after takeoff of RV marginal with collaterals treated medically, otherwise mild nonobstructive disease. EF 55%;  b. 07/2015 Lexiscan CL: EF 56%, no ischemia/infarct.   . Chronic low back pain   . DDD (degenerative disc disease), lumbar   . GERD (gastroesophageal reflux disease)    PPI bid needed to control sx's  . Gout   . Hearing loss   . History  of basal cell  carcinoma   . History of adenomatous polyp of colon   . Hypercholesteremia   . Hypertension    Hx labile/uncontrolled.  Improved s/p visit to HTN clinic 2019.  . Iron deficiency anemia 2017   Colonoscopy, EGD, and capsule endoscopy w/out obvious cause/source found.  Oral iron helping as of 03/2016.  . Leg pain 03/06/2016  . Nonmelanoma skin cancer   . 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 Peripheral vascular disease ([place])   . Pleural thickening    chronic  . Prediabetes    HbA1c 6 % 09/2015.  . Wheezing    Allergist has him on prn albuterol HFA    Past Surgical History:  Procedure Laterality Date  . back injection  oct 2011, 07/2010, 12/2012   no surgery  . Capsule endoscopy  02/2016   mild duodenitis, o/w NEG  . CARDIOVASCULAR STRESS TEST  05/2009; 07/2015  . carotid dopplers  11/2014   Bilateral - 1% to 9% ICA stenosis. Vertebral artery flow is  . COLONOSCOPY  08/2008; 02/2016; 02/2017   2017: 12 polyps (adenomatous).  02/2017 repeat TCS--multiple polyps: recall 3 yrs.  . CORONARY ANGIOPLASTY WITH STENT PLACEMENT  12/04/2014   4.0 x 12 mm Synergy stent to the mid LAD  . ESOPHAGOGASTRODUODENOSCOPY  02/2016   Gastritis.  NEG h pylori.    . LE arterial vascular study  02/2016   Per Dr. Radford Pax, no  evidence of peripheral vascular disease  . LEFT HEART CATHETERIZATION WITH CORONARY ANGIOGRAM N/A 12/04/2014   Procedure: LEFT HEART CATHETERIZATION WITH CORONARY ANGIOGRAM;  Surgeon: Jolaine Artist, MD; LAD 99% then aneurysmal, OM1 40%, CFX 50%, RCA 40%, 50% then occluded  . ROTATOR CUFF REPAIR Left 09/2013   left  . SHOULDER INJECTION  feb 2014  . TRANSTHORACIC ECHOCARDIOGRAM  11/2014   EF 50-55%, some wall motion abnormalities noted, grade I diast dysfxn    Family History  Problem Relation Age of Onset  . Lung cancer Father   . Heart disease Father   . Breast cancer Mother   . Congestive Heart Failure Mother   . Diabetes Mother   . Breast cancer Sister   . Heart disease  Brother   . COPD Brother   . Colon cancer Neg Hx   . Esophageal cancer Neg Hx   . Pancreatic cancer Neg Hx   . Rectal cancer Neg Hx   . Stomach cancer Neg Hx   . Prostate cancer Neg Hx     Social History   Socioeconomic History  . Marital status: Married    Spouse name: Vaughan Basta  . Number of children: 3  . Years of education: Not on file  . Highest education level: Not on file  Occupational History  . Occupation: retired  Scientific laboratory technician  . Financial resource strain: Not on file  . Food insecurity    Worry: Not on file    Inability: Not on file  . Transportation needs    Medical: Not on file    Non-medical: Not on file  Tobacco Use  . Smoking status: Former Smoker    Packs/day: 2.00    Years: 35.00    Pack years: 70.00    Types: Cigarettes    Quit date: 09/28/1998    Years since quitting: 20.5  . Smokeless tobacco: Former Systems developer    Types: Snuff    Quit date: 02/09/1986  . Tobacco comment: as a teenager  Substance and Sexual Activity  . Alcohol use: No    Alcohol/week: 0.0 standard drinks    Comment: quit 1994  . Drug use: No  . Sexual activity: Not on file  Lifestyle  . Physical activity    Days per week: Not on file    Minutes per session: Not on file  . Stress: Not on file  Relationships  . Social Herbalist on phone: Not on file    Gets together: Not on file    Attends religious service: Not on file    Active member of club or organization: Not on file    Attends meetings of clubs or organizations: Not on file    Relationship status: Not on file  . Intimate partner violence    Fear of current or ex partner: Not on file    Emotionally abused: Not on file    Physically abused: Not on file    Forced sexual activity: Not on file  Other Topics Concern  . Not on file  Social History Narrative   Married, 3 children.   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   Orig from [place], lived [place] same farm all his life.   Former smoker:  quit approx around [place].  80 pack-yr hx.   Alcohol-none, used to drink a lot of beer on weekends.  No drugs.   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   Diet: fair  Outpatient Medications Prior to Visit  Medication Sig Dispense Refill  . acetaminophen (TYLENOL) 500 MG tablet Take 500 mg by mouth every 6 (six) hours as needed for moderate pain.    Marland Kitchen allopurinol (ZYLOPRIM) 300 MG tablet TAKE 1 TABLET BY MOUTH EVERY DAY 90 tablet 3  . amLODipine (NORVASC) 5 MG tablet TAKE 1 TABLET BY MOUTH EVERY DAY 90 tablet 1  . aspirin 81 MG tablet Take 81 mg by mouth daily.      Marland Kitchen desonide (DESOWEN) 0.05 % cream Apply 1 application topically daily. Reported on 02/27/2016  1  . diphenhydrAMINE (BENADRYL) 25 MG tablet Take 50 mg by mouth 3 (three) times daily as needed for itching or allergies.     . fluticasone (FLONASE) 50 MCG/ACT nasal spray Place 1 spray into both nostrils daily.    Marland Kitchen gabapentin (NEURONTIN) 300 MG capsule Take 600 mg by mouth 2 (two) times daily.     Marland Kitchen loratadine (CLARITIN) 10 MG tablet Take 20 mg by mouth daily. As needed for allergy    . losartan (COZAAR) 50 MG tablet TAKE 1 TABLET BY MOUTH TWICE A DAY 60 tablet 11  . metoprolol tartrate (LOPRESSOR) 25 MG tablet Take 1 tablet (25 mg total) by mouth 2 (two) times daily. 180 tablet 3  . nitroGLYCERIN (NITROSTAT) 0.4 MG SL tablet Place 1 tablet (0.4 mg total) under the tongue every 5 (five) minutes as needed for chest pain. 25 tablet 6  . pantoprazole (PROTONIX) 40 MG tablet Take 1 tablet (40 mg total) by mouth daily. 90 tablet 3  . PREVIDENT 5000 BOOSTER PLUS 1.1 % PSTE Take by mouth as directed. APPLY TO TEETH AT BEDTIME  1  . rosuvastatin (CRESTOR) 20 MG tablet TAKE 1 TABLET BY MOUTH EVERY DAY 90 tablet 1  . traMADol (ULTRAM) 50 MG tablet TAKE 1 TABLET (50 MG TOTAL) BY MOUTH 3 (THREE) TIMES DAILY AS NEEDED. FOR PAIN 20 tablet 0  . EPINEPHrine 0.3 mg/0.3 mL IJ SOAJ injection AS DIRECTED AS NEEDED    . sildenafil (VIAGRA) 100 MG  tablet Take 1 tablet (100 mg total) by mouth daily as needed for erectile dysfunction. (Patient not taking: Reported on 04/05/2019) 10 tablet 6   No facility-administered medications prior to visit.     No Known Allergies  ROS Review of Systems  Constitutional: Negative for appetite change, chills, fatigue and fever.  HENT: Negative for congestion, dental problem, ear pain and sore throat.   Eyes: Negative for discharge, redness and visual disturbance.  Respiratory: Negative for cough, chest tightness, shortness of breath and wheezing.   Cardiovascular: Negative for chest pain, palpitations and leg swelling.  Gastrointestinal: Negative for abdominal pain, blood in stool, diarrhea, nausea and vomiting.  Genitourinary: Negative for difficulty urinating, dysuria, flank pain, frequency, hematuria and urgency.  Musculoskeletal: Positive for back pain. Negative for arthralgias, joint swelling, myalgias and neck stiffness.       Pain in both LL's (not knees or ankles or feet)  Skin: Negative for pallor and rash.  Neurological: Negative for dizziness, speech difficulty, weakness and headaches.  Hematological: Negative for adenopathy. Does not bruise/bleed easily.  Psychiatric/Behavioral: Negative for confusion and sleep disturbance. The patient is not nervous/anxious.     PE; Blood pressure 114/70, pulse (!) 50, temperature 98 F (36.7 C), temperature source Temporal, resp. rate 16, height 6\' 2"  (1.88 m), weight 217 lb 6.4 oz (98.6 kg), SpO2 97 %. Body mass index is 27.91 kg/m.  Gen: Alert, well appearing.  Patient  is oriented to person, place, time, and situation. AFFECT: pleasant, lucid thought and speech. ENT: Ears: EACs clear, normal epithelium.  TMs with good light reflex and landmarks bilaterally.  Eyes: no injection, icteris, swelling, or exudate.  EOMI, PERRLA. Nose: no drainage or turbinate edema/swelling.  No injection or focal lesion.  Mouth: lips without lesion/swelling.  Oral  mucosa pink and moist.  Dentition intact and without obvious caries or gingival swelling.  Oropharynx without erythema, exudate, or swelling.  Neck: supple/nontender.  No LAD, mass, or TM.  Carotid pulses 2+ bilaterally, without bruits. CV: RRR, no m/r/g.   LUNGS: CTA bilat, nonlabored resps, good aeration in all lung fields. ABD: soft, NT, ND, BS normal.  No hepatospenomegaly or mass.  No bruits. EXT: no clubbing, cyanosis, or edema.  Musculoskeletal: no joint swelling, erythema, warmth, or tenderness.  ROM of all joints intact. Skin - no sores or suspicious lesions or rashes or color changes   Pertinent labs:  Lab Results  Component Value Date   TSH 1.29 03/22/2014   Lab Results  Component Value Date   WBC 8.2 08/31/2018   HGB 15.3 08/31/2018   HCT 44.4 08/31/2018   MCV 93.2 08/31/2018   PLT 192.0 08/31/2018   Lab Results  Component Value Date   CREATININE 1.10 03/13/2019   BUN 11 03/13/2019   NA 143 03/13/2019   K 4.4 03/13/2019   CL 105 03/13/2019   CO2 25 03/13/2019   Lab Results  Component Value Date   ALT 12 03/13/2019   AST 20 03/13/2019   ALKPHOS 47 03/13/2019   BILITOT 0.7 03/13/2019   Lab Results  Component Value Date   CHOL 119 03/13/2019   Lab Results  Component Value Date   HDL 54 03/13/2019   Lab Results  Component Value Date   LDLCALC 44 03/13/2019   Lab Results  Component Value Date   TRIG 106 03/13/2019   Lab Results  Component Value Date   CHOLHDL 2.2 03/13/2019   Lab Results  Component Value Date   PSA 2.13 03/22/2014   PSA 1.77 02/21/2013   PSA 1.90 02/25/2012   Lab Results  Component Value Date   HGBA1C 5.0 10/22/2017    ASSESSMENT AND PLAN:   1) Chronic pain syndrome: The current medical regimen is effective;  continue present plan and medications. He may be going to see his specialist soon for a back injection. Will continue current treatment with tramadol. I did electronic rx for tramadol 50mg , 1 tid prn, #90, RF x 5.    PMP AWARE reviewed--no suspicious activity. UDS and CSC UTD. As per guidelines, a 6 mo office f/u for follow up use of tramadol is appropriate for this pt.  2) Health maintenance exam: Reviewed age and gender appropriate health maintenance issues (prudent diet, regular exercise, health risks of tobacco and excessive alcohol, use of seatbelts, fire alarms in home, use of sunscreen).  Also reviewed age and gender appropriate health screening as well as vaccine recommendations. Vaccines: all UTD, including shingrix. Labs: CBC (hx of IDA of unknown cause), A1c (prediabetes). Prostate ca screening: he declines any further prostate ca screening. Colon ca screening: recall 02/2020.  An After Visit Summary was printed and given to the patient.  FOLLOW UP:  Return in about 6 months (around 10/06/2019) for routine chronic illness f/u.  Signed:  Crissie Sickles, MD           04/05/2019

## 2019-04-05 NOTE — Patient Instructions (Signed)

## 2019-04-06 LAB — CBC
HCT: 44.7 % (ref 38.5–50.0)
Hemoglobin: 15.6 g/dL (ref 13.2–17.1)
MCH: 32.2 pg (ref 27.0–33.0)
MCHC: 34.9 g/dL (ref 32.0–36.0)
MCV: 92.4 fL (ref 80.0–100.0)
MPV: 11.1 fL (ref 7.5–12.5)
Platelets: 181 10*3/uL (ref 140–400)
RBC: 4.84 10*6/uL (ref 4.20–5.80)
RDW: 12.9 % (ref 11.0–15.0)
WBC: 7.3 10*3/uL (ref 3.8–10.8)

## 2019-04-06 LAB — HEMOGLOBIN A1C
Hgb A1c MFr Bld: 5.2 % of total Hgb (ref <5.7)
Mean Plasma Glucose: 103 (calc)
eAG (mmol/L): 5.7 (calc)

## 2019-04-11 ENCOUNTER — Other Ambulatory Visit: Payer: Self-pay | Admitting: Family Medicine

## 2019-04-11 NOTE — Telephone Encounter (Signed)
Pt is calling and needs a refill on rosuvastatin 20 mg. [facility] [place]

## 2019-04-12 MED ORDER — ROSUVASTATIN CALCIUM 20 MG PO TABS: 20.0000 mg | ORAL_TABLET | Freq: Every day | ORAL | 1 refills | Status: DC

## 2019-04-12 NOTE — Telephone Encounter (Signed)
RX sent to [facility] [place].

## 2019-04-13 DIAGNOSIS — J3089 Other allergic rhinitis: Secondary | ICD-10-CM | POA: Diagnosis not present

## 2019-04-13 DIAGNOSIS — J301 Allergic rhinitis due to pollen: Secondary | ICD-10-CM | POA: Diagnosis not present

## 2019-04-17 DIAGNOSIS — J3089 Other allergic rhinitis: Secondary | ICD-10-CM | POA: Diagnosis not present

## 2019-04-17 DIAGNOSIS — J301 Allergic rhinitis due to pollen: Secondary | ICD-10-CM | POA: Diagnosis not present

## 2019-04-24 DIAGNOSIS — J3089 Other allergic rhinitis: Secondary | ICD-10-CM | POA: Diagnosis not present

## 2019-04-24 DIAGNOSIS — J301 Allergic rhinitis due to pollen: Secondary | ICD-10-CM | POA: Diagnosis not present

## 2019-05-02 DIAGNOSIS — J3089 Other allergic rhinitis: Secondary | ICD-10-CM | POA: Diagnosis not present

## 2019-05-02 DIAGNOSIS — J301 Allergic rhinitis due to pollen: Secondary | ICD-10-CM | POA: Diagnosis not present

## 2019-05-04 ENCOUNTER — Ambulatory Visit: Payer: Medicare HMO | Admitting: Family Medicine

## 2019-05-04 ENCOUNTER — Telehealth: Payer: Self-pay | Admitting: Family Medicine

## 2019-05-04 ENCOUNTER — Encounter: Payer: Self-pay | Admitting: Family Medicine

## 2019-05-04 ENCOUNTER — Other Ambulatory Visit: Payer: Self-pay

## 2019-05-04 VITALS — BP 120/76 | HR 67 | Temp 98.2°F | Resp 16 | Ht 74.0 in | Wt 217.4 lb

## 2019-05-04 DIAGNOSIS — R319 Hematuria, unspecified: Secondary | ICD-10-CM

## 2019-05-04 DIAGNOSIS — R310 Gross hematuria: Secondary | ICD-10-CM

## 2019-05-04 DIAGNOSIS — R809 Proteinuria, unspecified: Secondary | ICD-10-CM | POA: Diagnosis not present

## 2019-05-04 LAB — POCT URINALYSIS DIPSTICK
Bilirubin, UA: NEGATIVE
Glucose, UA: NEGATIVE
Ketones, UA: NEGATIVE
Nitrite, UA: NEGATIVE
Protein, UA: POSITIVE — AB
Spec Grav, UA: 1.01 (ref 1.010–1.025)
Urobilinogen, UA: 0.2 E.U./dL
pH, UA: 7.5 (ref 5.0–8.0)

## 2019-05-04 NOTE — Telephone Encounter (Signed)
Pt was contacted and scheduled for 4 pm, in office visit.

## 2019-05-04 NOTE — Telephone Encounter (Signed)
Patient reporting blood in urine x 3 days. Has had kidney stones in past. Blood is clotting and looks like "beet juice". Patient reports it is the worst he has ever seen.  Reporting no other symptoms.   Please contact patient and advise if we need to work patient in today with Dr. Anitra Lauth or send patient to ER  Sending as high priority. Patient can be contacted at [phone]

## 2019-05-04 NOTE — Telephone Encounter (Signed)
Ok to put on this afternoon as a 4 PM acute in office.-thx

## 2019-05-04 NOTE — Progress Notes (Signed)
OFFICE VISIT  05/04/2019   CC:  Chief Complaint  Patient presents with  . blood in urine    x 3 days, lower back pain with soreness    HPI:    Patient is a 78 y.o. Caucasian male who presents for 3d hx of seeing blood in his urine.  About 1 mo ago he had one void that was bloody and then it never happened again until 3d/a. At onset 3 d/a it was deep purplish color, last 24 has been beet juice red color.  Has noted some small clots today. No dysuria.  He has some R upper glut pain but no flank pain, no side pain, no abd pain, no groin pain.  He has never passed a kidney stone but states that when he got an x-ray of his back last year some renal calcifications were noted.  These are not mentioned in the radiologist's report on these films but he says the doctor who saw him in the office pointed them out to him (the pt) at that time.  Past Medical History:  Diagnosis Date  . Allergic rhinitis    allergy shots via [place]  . Anxiety   . 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 CAD, multiple vessel    a. CP/near-syncope 11/2014 s/p DES to MLAD, occluded RCA after takeoff of RV marginal with collaterals treated medically, otherwise mild nonobstructive disease. EF 55%;  b. 07/2015 Lexiscan CL: EF 56%, no ischemia/infarct.   . Chronic low back pain   . DDD (degenerative disc disease), lumbar   . GERD (gastroesophageal reflux disease)    PPI bid needed to control sx's  . Gout   . Hearing loss   . History  of basal cell carcinoma   . History of adenomatous polyp of colon   . Hypercholesteremia   . Hypertension    Hx labile/uncontrolled.  Improved s/p visit to HTN clinic 2019.  . Iron deficiency anemia 2017   Colonoscopy, EGD, and capsule endoscopy w/out obvious cause/source found.  Oral iron helping as of 03/2016.  . Leg pain 03/06/2016  . Nonmelanoma skin cancer   . 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 Peripheral vascular disease ([place])   . Pleural thickening    chronic  .  Prediabetes    HbA1c 6 % 09/2015.  . Wheezing    Allergist has him on prn albuterol HFA    Past Surgical History:  Procedure Laterality Date  . back injection  oct 2011, 07/2010, 12/2012   no surgery  . Capsule endoscopy  02/2016   mild duodenitis, o/w NEG  . CARDIOVASCULAR STRESS TEST  05/2009; 07/2015  . carotid dopplers  11/2014   Bilateral - 1% to 9% ICA stenosis. Vertebral artery flow is  . COLONOSCOPY  08/2008; 02/2016; 02/2017   2017: 12 polyps (adenomatous).  02/2017 repeat TCS--multiple polyps: recall 3 yrs.  . CORONARY ANGIOPLASTY WITH STENT PLACEMENT  12/04/2014   4.0 x 12 mm Synergy stent to the mid LAD  . ESOPHAGOGASTRODUODENOSCOPY  02/2016   Gastritis.  NEG h pylori.    . LE arterial vascular study  02/2016   Per Dr. Radford Pax, no evidence of peripheral vascular disease  . LEFT HEART CATHETERIZATION WITH CORONARY ANGIOGRAM N/A 12/04/2014   Procedure: LEFT HEART CATHETERIZATION WITH CORONARY ANGIOGRAM;  Surgeon: Jolaine Artist, MD; LAD 99% then aneurysmal, OM1 40%, CFX 50%, RCA 40%, 50% then occluded  . ROTATOR CUFF REPAIR Left 09/2013   left  . SHOULDER INJECTION  feb  2014  . TRANSTHORACIC ECHOCARDIOGRAM  11/2014   EF 50-55%, some wall motion abnormalities noted, grade I diast dysfxn    Outpatient Medications Prior to Visit  Medication Sig Dispense Refill  . acetaminophen (TYLENOL) 500 MG tablet Take 500 mg by mouth every 6 (six) hours as needed for moderate pain.    Marland Kitchen allopurinol (ZYLOPRIM) 300 MG tablet TAKE 1 TABLET BY MOUTH EVERY DAY 90 tablet 3  . amLODipine (NORVASC) 5 MG tablet Take 1 tablet (5 mg total) by mouth daily. 90 tablet 1  . aspirin 81 MG tablet Take 81 mg by mouth daily.      Marland Kitchen desonide (DESOWEN) 0.05 % cream Apply 1 application topically daily. Reported on 02/27/2016  1  . diphenhydrAMINE (BENADRYL) 25 MG tablet Take 50 mg by mouth 3 (three) times daily as needed for itching or allergies.     . fluticasone (FLONASE) 50 MCG/ACT nasal spray Place 1 spray into  both nostrils daily.    Marland Kitchen gabapentin (NEURONTIN) 300 MG capsule Take 600 mg by mouth 2 (two) times daily.     Marland Kitchen loratadine (CLARITIN) 10 MG tablet Take 20 mg by mouth daily. As needed for allergy    . losartan (COZAAR) 50 MG tablet TAKE 1 TABLET BY MOUTH TWICE A DAY 60 tablet 11  . metoprolol tartrate (LOPRESSOR) 25 MG tablet Take 1 tablet (25 mg total) by mouth 2 (two) times daily. 180 tablet 3  . nitroGLYCERIN (NITROSTAT) 0.4 MG SL tablet Place 1 tablet (0.4 mg total) under the tongue every 5 (five) minutes as needed for chest pain. 25 tablet 6  . pantoprazole (PROTONIX) 40 MG tablet Take 1 tablet (40 mg total) by mouth daily. 90 tablet 3  . PREVIDENT 5000 BOOSTER PLUS 1.1 % PSTE Take by mouth as directed. APPLY TO TEETH AT BEDTIME  1  . rosuvastatin (CRESTOR) 20 MG tablet Take 1 tablet (20 mg total) by mouth daily. 90 tablet 1  . traMADol (ULTRAM) 50 MG tablet Take 1 tablet (50 mg total) by mouth 3 (three) times daily as needed. for pain 90 tablet 5  . EPINEPHrine 0.3 mg/0.3 mL IJ SOAJ injection AS DIRECTED AS NEEDED    . sildenafil (VIAGRA) 100 MG tablet Take 1 tablet (100 mg total) by mouth daily as needed for erectile dysfunction. (Patient not taking: Reported on 04/05/2019) 10 tablet 6   No facility-administered medications prior to visit.     No Known Allergies  ROS As per HPI  PE: Blood pressure 120/76, pulse 67, temperature 98.2 F (36.8 C), temperature source Temporal, resp. rate 16, height 6\' 2"  (1.88 m), weight 217 lb 6.4 oz (98.6 kg), SpO2 96 %. Gen: Alert, well appearing.  Patient is oriented to person, place, time, and situation. AFFECT: pleasant, lucid thought and speech. ZDG:LOVF: no injection, icteris, swelling, or exudate.  EOMI, PERRLA. Mouth: lips without lesion/swelling.  Oral mucosa pink and moist. Oropharynx without erythema, exudate, or swelling.  CV: RRR, no m/r/g.   LUNGS: CTA bilat, nonlabored resps, good aeration in all lung fields. Back: no CVA  tenderness. ABD: he has some diffuse suprapubic level pain on L and R.  No guarding or rebound.  No tenderness in the sides or other areas of abdomen.  BS normal.  No HSM, mass, or bruit. EXT: no clubbing or cyanosis.  1+  edema.  SKIN; no pallor or jaundice or rash.  LABS:    Chemistry      Component Value Date/Time   NA 143  03/13/2019 1141   K 4.4 03/13/2019 1141   CL 105 03/13/2019 1141   CO2 25 03/13/2019 1141   BUN 11 03/13/2019 1141   CREATININE 1.10 03/13/2019 1141   CREATININE 0.95 04/21/2018 1036      Component Value Date/Time   CALCIUM 9.2 03/13/2019 1141   ALKPHOS 47 03/13/2019 1141   AST 20 03/13/2019 1141   ALT 12 03/13/2019 1141   BILITOT 0.7 03/13/2019 1141     Lab Results  Component Value Date   WBC 7.3 04/05/2019   HGB 15.6 04/05/2019   HCT 44.7 04/05/2019   MCV 92.4 04/05/2019   PLT 181 04/05/2019   POC dipstick UA today: color red and cloudy, 3+ blood, protein positive, trace LEUKs.  IMPRESSION AND PLAN:  Gross hematuria, undetermined etiology. CBC w/diff, CMET. CT abd pelv w and w/out contrast. Urol referral after results are in. No new meds today. No meds d/c'd today.  An After Visit Summary was printed and given to the patient.  FOLLOW UP: Return for f/u to be determined based on results of w/u.  Signed:  Crissie Sickles, MD           05/04/2019

## 2019-05-05 DIAGNOSIS — J301 Allergic rhinitis due to pollen: Secondary | ICD-10-CM | POA: Diagnosis not present

## 2019-05-05 DIAGNOSIS — J3089 Other allergic rhinitis: Secondary | ICD-10-CM | POA: Diagnosis not present

## 2019-05-06 ENCOUNTER — Encounter (HOSPITAL_BASED_OUTPATIENT_CLINIC_OR_DEPARTMENT_OTHER): Payer: Self-pay | Admitting: Emergency Medicine

## 2019-05-06 ENCOUNTER — Encounter (HOSPITAL_BASED_OUTPATIENT_CLINIC_OR_DEPARTMENT_OTHER): Payer: Self-pay

## 2019-05-06 ENCOUNTER — Other Ambulatory Visit: Payer: Self-pay

## 2019-05-06 ENCOUNTER — Emergency Department (HOSPITAL_BASED_OUTPATIENT_CLINIC_OR_DEPARTMENT_OTHER)
Admission: EM | Admit: 2019-05-06 | Discharge: 2019-05-06 | Disposition: A | Discharge status: Home / Self Care | Payer: Medicare HMO

## 2019-05-06 ENCOUNTER — Ambulatory Visit (HOSPITAL_BASED_OUTPATIENT_CLINIC_OR_DEPARTMENT_OTHER)
Admission: RE | Admit: 2019-05-06 | Discharge: 2019-05-06 | Disposition: A | Discharge status: Home / Self Care | Payer: Medicare HMO | Source: Ambulatory Visit | Attending: Family Medicine | Admitting: Family Medicine

## 2019-05-06 DIAGNOSIS — R310 Gross hematuria: Secondary | ICD-10-CM | POA: Diagnosis not present

## 2019-05-06 DIAGNOSIS — N200 Calculus of kidney: Secondary | ICD-10-CM | POA: Diagnosis not present

## 2019-05-06 LAB — BASIC METABOLIC PANEL
BUN: 10 mg/dL (ref 7–25)
CO2: 28 mmol/L (ref 20–32)
Calcium: 9.6 mg/dL (ref 8.6–10.3)
Chloride: 106 mmol/L (ref 98–110)
Creat: 1.03 mg/dL (ref 0.70–1.18)
Glucose, Bld: 84 mg/dL (ref 65–99)
Potassium: 4.6 mmol/L (ref 3.5–5.3)
Sodium: 142 mmol/L (ref 135–146)

## 2019-05-06 LAB — URINE CULTURE
MICRO NUMBER:: 745731
Result:: NO GROWTH
SPECIMEN QUALITY:: ADEQUATE

## 2019-05-06 LAB — CBC WITH DIFFERENTIAL/PLATELET
Absolute Monocytes: 569 cells/uL (ref 200–950)
Basophils Absolute: 43 cells/uL (ref 0–200)
Basophils Relative: 0.6 %
Eosinophils Absolute: 367 cells/uL (ref 15–500)
Eosinophils Relative: 5.1 %
HCT: 43.2 % (ref 38.5–50.0)
Hemoglobin: 15 g/dL (ref 13.2–17.1)
Lymphs Abs: 2549 cells/uL (ref 850–3900)
MCH: 32.2 pg (ref 27.0–33.0)
MCHC: 34.7 g/dL (ref 32.0–36.0)
MCV: 92.7 fL (ref 80.0–100.0)
MPV: 11.5 fL (ref 7.5–12.5)
Monocytes Relative: 7.9 %
Neutro Abs: 3672 cells/uL (ref 1500–7800)
Neutrophils Relative %: 51 %
Platelets: 169 10*3/uL (ref 140–400)
RBC: 4.66 10*6/uL (ref 4.20–5.80)
RDW: 13 % (ref 11.0–15.0)
Total Lymphocyte: 35.4 %
WBC: 7.2 10*3/uL (ref 3.8–10.8)

## 2019-05-06 MED ORDER — IOHEXOL 300 MG/ML  SOLN
100.0000 mL | Freq: Once | INTRAMUSCULAR | Status: AC | PRN
  Administered 2019-05-06: 100 mL via INTRAVENOUS

## 2019-05-06 NOTE — ED Notes (Signed)
It was discovered that pt had an appt for an outpatient CT

## 2019-05-06 NOTE — ED Triage Notes (Signed)
Pt c/o hematuria x 5d; sts he was sent here by PCP "for a CT" and then he will be referred to a urologist; c/o mild lower back pain

## 2019-05-08 ENCOUNTER — Telehealth: Payer: Self-pay | Admitting: Family Medicine

## 2019-05-08 NOTE — Telephone Encounter (Signed)
Returning Dr. Idelle Leech call --- please call patient back

## 2019-05-09 ENCOUNTER — Other Ambulatory Visit: Payer: Self-pay | Admitting: Family Medicine

## 2019-05-09 DIAGNOSIS — N3289 Other specified disorders of bladder: Secondary | ICD-10-CM

## 2019-05-09 DIAGNOSIS — R310 Gross hematuria: Secondary | ICD-10-CM

## 2019-05-09 NOTE — Telephone Encounter (Signed)
Patient stated he was called back by PCP.

## 2019-05-15 DIAGNOSIS — J301 Allergic rhinitis due to pollen: Secondary | ICD-10-CM | POA: Diagnosis not present

## 2019-05-15 DIAGNOSIS — J3089 Other allergic rhinitis: Secondary | ICD-10-CM | POA: Diagnosis not present

## 2019-05-16 ENCOUNTER — Encounter: Payer: Self-pay | Admitting: Family Medicine

## 2019-05-17 DIAGNOSIS — R69 Illness, unspecified: Secondary | ICD-10-CM | POA: Diagnosis not present

## 2019-05-18 DIAGNOSIS — R69 Illness, unspecified: Secondary | ICD-10-CM | POA: Diagnosis not present

## 2019-05-24 DIAGNOSIS — D414 Neoplasm of uncertain behavior of bladder: Secondary | ICD-10-CM | POA: Diagnosis not present

## 2019-05-24 DIAGNOSIS — R310 Gross hematuria: Secondary | ICD-10-CM | POA: Diagnosis not present

## 2019-05-24 DIAGNOSIS — R8279 Other abnormal findings on microbiological examination of urine: Secondary | ICD-10-CM | POA: Diagnosis not present

## 2019-05-24 HISTORY — PX: CYSTOSCOPY: SUR368

## 2019-05-25 ENCOUNTER — Other Ambulatory Visit: Payer: Self-pay | Admitting: Urology

## 2019-05-26 DIAGNOSIS — J301 Allergic rhinitis due to pollen: Secondary | ICD-10-CM | POA: Diagnosis not present

## 2019-05-26 DIAGNOSIS — J3089 Other allergic rhinitis: Secondary | ICD-10-CM | POA: Diagnosis not present

## 2019-05-27 ENCOUNTER — Other Ambulatory Visit: Payer: Self-pay | Admitting: Gastroenterology

## 2019-05-29 ENCOUNTER — Telehealth: Payer: Self-pay | Admitting: Family Medicine

## 2019-05-29 MED ORDER — ALLOPURINOL 300 MG PO TABS: 300.0000 mg | ORAL_TABLET | Freq: Every day | ORAL | 0 refills | Status: DC

## 2019-05-29 NOTE — Telephone Encounter (Signed)
Patient is requesting allopurinol (ZYLOPRIM) 300 MG tablet Rx to be sent to [facility]. He has contacted pharmacy, they advised patient to call our office.

## 2019-05-29 NOTE — Telephone Encounter (Signed)
[facility] to [facility] for 90 day supply.

## 2019-05-30 DIAGNOSIS — J301 Allergic rhinitis due to pollen: Secondary | ICD-10-CM | POA: Diagnosis not present

## 2019-05-30 DIAGNOSIS — C679 Malignant neoplasm of bladder, unspecified: Secondary | ICD-10-CM

## 2019-05-30 DIAGNOSIS — J3089 Other allergic rhinitis: Secondary | ICD-10-CM | POA: Diagnosis not present

## 2019-05-30 HISTORY — DX: Malignant neoplasm of bladder, unspecified: C67.9

## 2019-06-02 DIAGNOSIS — J301 Allergic rhinitis due to pollen: Secondary | ICD-10-CM | POA: Diagnosis not present

## 2019-06-02 DIAGNOSIS — J3089 Other allergic rhinitis: Secondary | ICD-10-CM | POA: Diagnosis not present

## 2019-06-02 NOTE — Patient Instructions (Addendum)
DUE TO COVID-19 ONLY ONE VISITOR IS ALLOWED TO COME WITH YOU AND STAY IN THE WAITING ROOM ONLY DURING PRE OP AND PROCEDURE DAY OF SURGERY. THE 1 VISITOR MAY VISIT WITH YOU AFTER SURGERY IN YOUR PRIVATE ROOM DURING VISITING HOURS ONLY!  YOU NEED TO HAVE A COVID 19 TEST ON Thursday 06-08-2019 @_245  pm. THIS TEST MUST BE DONE BEFORE SURGERY, COME  [place], [address] [place] , [address].  ([place]) ONCE YOUR COVID TEST IS COMPLETED, PLEASE BEGIN THE QUARANTINE INSTRUCTIONS AS OUTLINED IN YOUR HANDOUT.                [name]    Your procedure is scheduled on: 06-12-2019   Report to [facility] Main  Entrance   Report to admitting at 830  AM     Call this number if you have problems the morning of surgery [phone]    Remember: Do not eat food or drink liquids :After Midnight. BRUSH YOUR TEETH MORNING OF SURGERY AND RINSE YOUR MOUTH OUT, NO CHEWING GUM CANDY OR MINTS.     Take these medicines the morning of surgery with A SIP OF WATER: GABAPENTIN (NEURONTIN), LORATADINE, METOPROLOL TARTRATE, , FLONASE NASAL SPRAY, ALLOPURINOL, PANTAPRAZOLE (PROTONIX)                                You may not have any metal on your body including hair pins and              piercings  Do not wear jewelry, make-up, lotions, powders or perfumes, deodorant                         Men may shave face and neck.   Do not bring valuables to the hospital. [place].  Contacts, dentures or bridgework may not be worn into surgery.  Leave suitcase in the car. After surgery it may be brought to your room.     Patients discharged the day of surgery will not be allowed to drive home. IF YOU ARE HAVING SURGERY AND GOING HOME THE SAME DAY, YOU MUST HAVE AN ADULT TO DRIVE YOU HOME AND BE WITH YOU FOR 24 HOURS. YOU MAY GO HOME BY TAXI OR UBER OR ORTHERWISE, BUT AN ADULT MUST ACCOMPANY YOU HOME AND STAY WITH YOU FOR 24 HOURS.  Name and  phone number of your driver: [place] [phone]  Special Instructions: N/A              Please read over the following fact sheets you were given: _____________________________________________________________________             [facility] - Preparing for Surgery Before surgery, you can play an important role.  Because skin is not sterile, your skin needs to be as free of germs as possible.  You can reduce the number of germs on your skin by washing with CHG (chlorahexidine gluconate) soap before surgery.  CHG is an antiseptic cleaner which kills germs and bonds with the skin to continue killing germs even after washing. Please DO NOT use if you have an allergy to CHG or antibacterial soaps.  If your skin becomes reddened/irritated stop using the CHG and inform your nurse when you arrive at Short Stay. Do not shave (  including legs and underarms) for at least 48 hours prior to the first CHG shower.  You may shave your face/neck. Please follow these instructions carefully:  1.  Shower with CHG Soap the night before surgery and the  morning of Surgery.  2.  If you choose to wash your hair, wash your hair first as usual with your  normal  shampoo.  3.  After you shampoo, rinse your hair and body thoroughly to remove the  shampoo.                           4.  Use CHG as you would any other liquid soap.  You can apply chg directly  to the skin and wash                       Gently with a scrungie or clean washcloth.  5.  Apply the CHG Soap to your body ONLY FROM THE NECK DOWN.   Do not use on face/ open                           Wound or open sores. Avoid contact with eyes, ears mouth and genitals (private parts).                       Wash face,  Genitals (private parts) with your normal soap.             6.  Wash thoroughly, paying special attention to the area where your surgery  will be performed.  7.  Thoroughly rinse your body with warm water from the neck down.  8.  DO NOT  shower/wash with your normal soap after using and rinsing off  the CHG Soap.                9.  Pat yourself dry with a clean towel.            10.  Wear clean pajamas.            11.  Place clean sheets on your bed the night of your first shower and do not  sleep with pets. Day of Surgery : Do not apply any lotions/deodorants the morning of surgery.  Please wear clean clothes to the hospital/surgery center.  FAILURE TO FOLLOW THESE INSTRUCTIONS MAY RESULT IN THE CANCELLATION OF YOUR SURGERY PATIENT SIGNATURE_________________________________  NURSE SIGNATURE__________________________________  ________________________________________________________________________

## 2019-06-06 ENCOUNTER — Encounter (HOSPITAL_COMMUNITY): Payer: Self-pay

## 2019-06-06 ENCOUNTER — Encounter (HOSPITAL_COMMUNITY)
Admission: RE | Admit: 2019-06-06 | Discharge: 2019-06-06 | Disposition: A | Discharge status: Home / Self Care | Payer: Medicare HMO | Source: Ambulatory Visit | Attending: Urology | Admitting: Urology

## 2019-06-06 ENCOUNTER — Other Ambulatory Visit: Payer: Self-pay

## 2019-06-06 DIAGNOSIS — Z20828 Contact with and (suspected) exposure to other viral communicable diseases: Secondary | ICD-10-CM | POA: Diagnosis not present

## 2019-06-06 DIAGNOSIS — Z01818 Encounter for other preprocedural examination: Secondary | ICD-10-CM | POA: Insufficient documentation

## 2019-06-06 DIAGNOSIS — J3089 Other allergic rhinitis: Secondary | ICD-10-CM | POA: Diagnosis not present

## 2019-06-06 DIAGNOSIS — J301 Allergic rhinitis due to pollen: Secondary | ICD-10-CM | POA: Diagnosis not present

## 2019-06-06 HISTORY — DX: Unspecified cataract: H26.9

## 2019-06-06 LAB — BASIC METABOLIC PANEL
Anion gap: 9 (ref 5–15)
BUN: 14 mg/dL (ref 8–23)
CO2: 29 mmol/L (ref 22–32)
Calcium: 9.8 mg/dL (ref 8.9–10.3)
Chloride: 105 mmol/L (ref 98–111)
Creatinine, Ser: 0.97 mg/dL (ref 0.61–1.24)
GFR calc Af Amer: >60 mL/min (ref >60)
GFR calc non Af Amer: >60 mL/min (ref >60)
Glucose, Bld: 98 mg/dL (ref 70–99)
Potassium: 4.5 mmol/L (ref 3.5–5.1)
Sodium: 143 mmol/L (ref 135–145)

## 2019-06-06 LAB — CBC
HCT: 46.5 % (ref 39.0–52.0)
Hemoglobin: 15.6 g/dL (ref 13.0–17.0)
MCH: 31.7 pg (ref 26.0–34.0)
MCHC: 33.5 g/dL (ref 30.0–36.0)
MCV: 94.5 fL (ref 80.0–100.0)
Platelets: 175 10*3/uL (ref 150–400)
RBC: 4.92 MIL/uL (ref 4.22–5.81)
RDW: 13.2 % (ref 11.5–15.5)
WBC: 8.3 10*3/uL (ref 4.0–10.5)
nRBC: 0 % (ref 0.0–0.2)

## 2019-06-06 NOTE — Progress Notes (Addendum)
PCP - DR Julien Nordmann Cardiologist -  DR TURNER LOV 03-09-2019 EPIC  Chest x-ray - NONE EKG - 06-06-2019 EPIC Stress Test - NONE ECHO - 12-04-14 EPIC Cardiac Cath - 12-04-2014 EPIC  Sleep Study - NONE CPAP - NONE  Fasting Blood Sugar - N/A Checks Blood Sugar _____ times a day  Blood Thinner Instructions: Aspirin Instructions: 81 MG ASPIRIN  Last Dose:STOPPED 06-06-2019 PER DR BELL INSTRUCTIONS  Anesthesia review: chart to jessica zanetto pa for review  Patient denies shortness of breath, fever, cough and chest pain at PAT appointment   Patient verbalized understanding of instructions that were given to them at the PAT appointment. Patient was also instructed that they will need to review over the PAT instructions again at home before surgery.

## 2019-06-08 ENCOUNTER — Other Ambulatory Visit (HOSPITAL_COMMUNITY)
Admission: RE | Admit: 2019-06-08 | Discharge: 2019-06-08 | Disposition: A | Discharge status: Home / Self Care | Payer: Medicare HMO | Source: Ambulatory Visit | Attending: Urology | Admitting: Urology

## 2019-06-08 DIAGNOSIS — Z01818 Encounter for other preprocedural examination: Secondary | ICD-10-CM | POA: Diagnosis not present

## 2019-06-08 DIAGNOSIS — J3089 Other allergic rhinitis: Secondary | ICD-10-CM | POA: Diagnosis not present

## 2019-06-08 DIAGNOSIS — J301 Allergic rhinitis due to pollen: Secondary | ICD-10-CM | POA: Diagnosis not present

## 2019-06-09 NOTE — Progress Notes (Signed)
Anesthesia Chart Review   Case: [id number] Date/Time: 06/12/19 1015   Procedure: TRANSURETHRAL RESECTION OF BLADDER TUMOR (TURBT) (N/A )   Anesthesia type: General   Pre-op diagnosis: BLADDER TUMOR   Location: [place] / [facility] ORS   Surgeon: Lucas Mallow, MD      DISCUSSION:78 y.o. former smoker (9 pack years, quit 09/28/98) with h/o HTN, GERD, CAD (PCI LAD 2016), bladder tumor scheduled for above procedure 06/12/2019 with Dr. Link Snuffer.   Pt last seen by cardiologist, Dr. Fransico Him, 03/09/2019.  Stable at this visit with 1 year follow up recommended.   Stress test 2016 with no ischemia, low risk stress test.   Anticipate pt can proceed with planned procedure barring acute status change.   VS: BP (!) 134/91   Pulse (!) 53   Temp 36.8 C (Oral)   Resp 18   Ht 6\' 2"  (1.88 m)   Wt 98 kg   BMI 27.73 kg/m   PROVIDERS: [place], [name], MD is PCP   Fransico Him, MD is Cardiologist  LABS: Labs reviewed: Acceptable for surgery. (all labs ordered are listed, but only abnormal results are displayed)  Labs Reviewed  BASIC METABOLIC PANEL  CBC     IMAGES: CT Abdomen Pelvis 05/06/2019 IMPRESSION: 1. 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 2. 3 mm nonobstructive calculus in the lower pole collecting system of the right kidney. No ureteral stones or findings of urinary tract obstruction are noted at this time. 3. Colonic diverticulosis without evidence of acute diverticulitis at this time. 4. Aortic atherosclerosis, in addition to left main and 3 vessel coronary artery disease. 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 5. There are calcifications of the aortic valve and mitral annulus. Echocardiographic correlation for evaluation of  potential valvular dysfunction may be warranted if clinically indicated.  EKG: 06/06/2019 Rate 49 bpm Sinus bradycardia  Otherwise normal ECG Since last tracing rate slower   CV: Myocardial Perfusion 08/24/2015 IMPRESSION: 1. No reversible ischemia or infarction.  2. Mild septal hypokinesia.  No LEFT ventricular dilatation.  3. Left ventricular ejection fraction 56%  4. Low-risk stress test findings*. Past Medical History:  Diagnosis Date  . Allergic rhinitis    allergy shots via [place]  . Anxiety   . 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 CAD, multiple vessel    a. CP/near-syncope 11/2014 s/p DES to MLAD, occluded RCA after takeoff of RV marginal with collaterals treated medically, otherwise mild nonobstructive disease. EF 55%;  b. 07/2015 Lexiscan CL: EF 56%, no ischemia/infarct.   . Cataracts, both eyes   . Chronic low back pain   . DDD (degenerative disc disease), lumbar   . GERD (gastroesophageal reflux disease)    PPI bid needed to control sx's  . Gout   . Hearing loss   . History  of basal cell carcinoma   . History of adenomatous polyp of colon    Rpt colonoscopy recommended 02/2020->no further if no high risk lesions at that time.  . Hypercholesteremia   . Hypertension    Hx labile/uncontrolled.  Improved s/p visit to HTN clinic 2019.  . Iron deficiency anemia 2017   Colonoscopy, EGD, and capsule endoscopy w/out obvious cause/source found.  Oral iron helping as of 03/2016.  . Leg pain  03/06/2016  . Nonmelanoma skin cancer   . 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 Peripheral vascular disease ([place])   . Pleural thickening    chronic    Past Surgical History:  Procedure Laterality Date  . back injection  oct 2011, 07/2010, 12/2012   no surgery  . Capsule endoscopy  02/2016   mild duodenitis, o/w NEG  . CARDIOVASCULAR STRESS TEST  05/2009; 07/2015  . carotid dopplers  11/2014   Bilateral - 1% to 9% ICA stenosis. Vertebral artery flow is  .  COLONOSCOPY  08/2008; 02/2016; 02/2017   2017: 12 polyps (adenomatous).  02/2017 repeat TCS--multiple polyps: recall 3 yrs.  . CORONARY ANGIOPLASTY WITH STENT PLACEMENT  12/04/2014   4.0 x 12 mm Synergy stent to the mid LAD  . ESOPHAGOGASTRODUODENOSCOPY  02/2016   Gastritis.  NEG h pylori.    . LE arterial vascular study  02/2016   Per Dr. Radford Pax, no evidence of peripheral vascular disease  . LEFT HEART CATHETERIZATION WITH CORONARY ANGIOGRAM N/A 12/04/2014   Procedure: LEFT HEART CATHETERIZATION WITH CORONARY ANGIOGRAM;  Surgeon: Jolaine Artist, MD; LAD 99% then aneurysmal, OM1 40%, CFX 50%, RCA 40%, 50% then occluded  . ROTATOR CUFF REPAIR Left 09/2013   left  . SHOULDER INJECTION  feb 2014  . TRANSTHORACIC ECHOCARDIOGRAM  11/2014   EF 50-55%, some wall motion abnormalities noted, grade I diast dysfxn    MEDICATIONS: . acetaminophen (TYLENOL) 500 MG tablet  . allopurinol (ZYLOPRIM) 300 MG tablet  . amLODipine (NORVASC) 5 MG tablet  . aspirin 81 MG tablet  . desonide (DESOWEN) 0.05 % cream  . diphenhydrAMINE (BENADRYL) 25 MG tablet  . EPINEPHrine 0.3 mg/0.3 mL IJ SOAJ injection  . fluticasone (FLONASE) 50 MCG/ACT nasal spray  . gabapentin (NEURONTIN) 300 MG capsule  . loratadine (CLARITIN) 10 MG tablet  . losartan (COZAAR) 50 MG tablet  . metoprolol tartrate (LOPRESSOR) 25 MG tablet  . nitroGLYCERIN (NITROSTAT) 0.4 MG SL tablet  . pantoprazole (PROTONIX) 40 MG tablet  . PREVIDENT 5000 BOOSTER PLUS 1.1 % PSTE  . rosuvastatin (CRESTOR) 20 MG tablet  . sildenafil (VIAGRA) 100 MG tablet  . traMADol (ULTRAM) 50 MG tablet   No current facility-administered medications for this encounter.      Maia Plan [facility] Pre-Surgical Testing [phone] 06/09/19  9:59 AM

## 2019-06-09 NOTE — Anesthesia Preprocedure Evaluation (Addendum)
Anesthesia Evaluation  Patient identified by MRN, date of birth, ID band Patient awake    Reviewed: Allergy & Precautions, H&P , NPO status , Patient's Chart, lab work & pertinent test results, reviewed documented beta blocker date and time   Airway Mallampati: I  TM Distance: >3 FB Neck ROM: full    Dental no notable dental hx. (+) Teeth Intact, Caps   Pulmonary former smoker,    Pulmonary exam normal breath sounds clear to auscultation       Cardiovascular Exercise Tolerance: Good hypertension, + CAD and + Peripheral Vascular Disease  Normal cardiovascular exam Rhythm:regular Rate:Normal  EKG: 06/06/2019 Rate 49 bpm Sinus bradycardia  Otherwise normal ECG Since last tracing rate slower   CAD, multiple vessel    a. CP/near-syncope 11/2014 s/p DES to MLAD, occluded RCA after takeoff of RV marginal with collaterals treated medically, otherwise mild nonobstructive disease. EF 55%;  b. 07/2015 Lexiscan CL: EF 56%, no ischemia/infarct  CV: Myocardial Perfusion 08/24/2015 IMPRESSION: 1. No reversible ischemia or infarction. 2. Mild septal hypokinesia. No LEFT ventricular dilatation. 3. Left ventricular ejection fraction 56% 4. Low-risk stress test findings*.   Neuro/Psych Anxiety negative neurological ROS     GI/Hepatic Neg liver ROS, GERD  Medicated,  Endo/Other  negative endocrine ROS  Renal/GU negative Renal ROS  negative genitourinary   Musculoskeletal  (+) Arthritis , Osteoarthritis,    Abdominal   Peds  Hematology  (+) Blood dyscrasia, anemia ,   Anesthesia Other Findings   Reproductive/Obstetrics negative OB ROS                           Anesthesia Physical Anesthesia Plan  ASA: III  Anesthesia Plan: General   Post-op Pain Management:    Induction: Intravenous  PONV Risk Score and Plan: 2 and Ondansetron, Treatment may vary due to age or medical condition and  Dexamethasone  Airway Management Planned: Oral ETT and LMA  Additional Equipment:   Intra-op Plan:   Post-operative Plan:   Informed Consent: I have reviewed the patients History and Physical, chart, labs and discussed the procedure including the risks, benefits and alternatives for the proposed anesthesia with the patient or authorized representative who has indicated his/her understanding and acceptance.     Dental Advisory Given  Plan Discussed with: CRNA, Anesthesiologist and Surgeon  Anesthesia Plan Comments: (See PAT note 06/06/2019, Konrad Felix, PA-C)      Anesthesia Quick Evaluation

## 2019-06-10 LAB — NOVEL CORONAVIRUS, NAA (HOSP ORDER, SEND-OUT TO REF LAB; TAT 18-24 HRS): SARS-CoV-2, NAA: NOT DETECTED

## 2019-06-12 ENCOUNTER — Ambulatory Visit (HOSPITAL_COMMUNITY): Payer: Medicare HMO | Admitting: Physician Assistant

## 2019-06-12 ENCOUNTER — Ambulatory Visit (HOSPITAL_COMMUNITY)
Admission: RE | Admit: 2019-06-12 | Discharge: 2019-06-12 | Disposition: A | Discharge status: Home / Self Care | Payer: Medicare HMO | Attending: Urology | Admitting: Urology

## 2019-06-12 ENCOUNTER — Ambulatory Visit (HOSPITAL_COMMUNITY): Payer: Medicare HMO | Admitting: Certified Registered"

## 2019-06-12 ENCOUNTER — Encounter (HOSPITAL_COMMUNITY)
Admission: RE | Disposition: A | Discharge status: Home / Self Care | Payer: Self-pay | Source: Home / Self Care | Attending: Urology

## 2019-06-12 ENCOUNTER — Other Ambulatory Visit: Payer: Self-pay

## 2019-06-12 ENCOUNTER — Encounter (HOSPITAL_COMMUNITY): Payer: Self-pay

## 2019-06-12 DIAGNOSIS — Z7982 Long term (current) use of aspirin: Secondary | ICD-10-CM | POA: Insufficient documentation

## 2019-06-12 DIAGNOSIS — F419 Anxiety disorder, unspecified: Secondary | ICD-10-CM | POA: Diagnosis not present

## 2019-06-12 DIAGNOSIS — I10 Essential (primary) hypertension: Secondary | ICD-10-CM | POA: Diagnosis not present

## 2019-06-12 DIAGNOSIS — Z87891 Personal history of nicotine dependence: Secondary | ICD-10-CM | POA: Insufficient documentation

## 2019-06-12 DIAGNOSIS — D649 Anemia, unspecified: Secondary | ICD-10-CM | POA: Diagnosis not present

## 2019-06-12 DIAGNOSIS — I739 Peripheral vascular disease, unspecified: Secondary | ICD-10-CM | POA: Diagnosis not present

## 2019-06-12 DIAGNOSIS — Z955 Presence of coronary angioplasty implant and graft: Secondary | ICD-10-CM | POA: Insufficient documentation

## 2019-06-12 DIAGNOSIS — Z801 Family history of malignant neoplasm of trachea, bronchus and lung: Secondary | ICD-10-CM | POA: Insufficient documentation

## 2019-06-12 DIAGNOSIS — K219 Gastro-esophageal reflux disease without esophagitis: Secondary | ICD-10-CM | POA: Insufficient documentation

## 2019-06-12 DIAGNOSIS — I2510 Atherosclerotic heart disease of native coronary artery without angina pectoris: Secondary | ICD-10-CM | POA: Diagnosis not present

## 2019-06-12 DIAGNOSIS — Z8249 Family history of ischemic heart disease and other diseases of the circulatory system: Secondary | ICD-10-CM | POA: Insufficient documentation

## 2019-06-12 DIAGNOSIS — C672 Malignant neoplasm of lateral wall of bladder: Secondary | ICD-10-CM | POA: Diagnosis not present

## 2019-06-12 DIAGNOSIS — Z79899 Other long term (current) drug therapy: Secondary | ICD-10-CM | POA: Insufficient documentation

## 2019-06-12 DIAGNOSIS — M1990 Unspecified osteoarthritis, unspecified site: Secondary | ICD-10-CM | POA: Diagnosis not present

## 2019-06-12 DIAGNOSIS — Z833 Family history of diabetes mellitus: Secondary | ICD-10-CM | POA: Insufficient documentation

## 2019-06-12 DIAGNOSIS — R001 Bradycardia, unspecified: Secondary | ICD-10-CM | POA: Insufficient documentation

## 2019-06-12 DIAGNOSIS — E7800 Pure hypercholesterolemia, unspecified: Secondary | ICD-10-CM | POA: Diagnosis not present

## 2019-06-12 DIAGNOSIS — R310 Gross hematuria: Secondary | ICD-10-CM | POA: Diagnosis not present

## 2019-06-12 DIAGNOSIS — R69 Illness, unspecified: Secondary | ICD-10-CM | POA: Diagnosis not present

## 2019-06-12 DIAGNOSIS — C679 Malignant neoplasm of bladder, unspecified: Secondary | ICD-10-CM | POA: Diagnosis not present

## 2019-06-12 DIAGNOSIS — D494 Neoplasm of unspecified behavior of bladder: Secondary | ICD-10-CM | POA: Diagnosis not present

## 2019-06-12 DIAGNOSIS — M109 Gout, unspecified: Secondary | ICD-10-CM | POA: Insufficient documentation

## 2019-06-12 HISTORY — PX: TRANSURETHRAL RESECTION OF BLADDER TUMOR: SHX2575

## 2019-06-12 HISTORY — PX: CYSTOSCOPY W/ TRANSURETHRAL RESECTION OF POSTERIOR URETHERAL VALVES: SHX1428

## 2019-06-12 SURGERY — TURBT (TRANSURETHRAL RESECTION OF BLADDER TUMOR)
Anesthesia: General | Site: Bladder

## 2019-06-12 MED ORDER — OXYCODONE HCL 5 MG PO TABS: 5.0000 mg | ORAL_TABLET | Freq: Once | ORAL | Status: DC | PRN

## 2019-06-12 MED ORDER — ONDANSETRON HCL 4 MG/2ML IJ SOLN
INTRAMUSCULAR | Status: AC
  Filled 2019-06-12: qty 2

## 2019-06-12 MED ORDER — CEFAZOLIN SODIUM-DEXTROSE 2-4 GM/100ML-% IV SOLN
2.0000 g | INTRAVENOUS | Status: AC
  Administered 2019-06-12: 2 g via INTRAVENOUS
  Filled 2019-06-12: qty 100

## 2019-06-12 MED ORDER — FENTANYL CITRATE (PF) 100 MCG/2ML IJ SOLN
INTRAMUSCULAR | Status: AC
  Filled 2019-06-12: qty 2

## 2019-06-12 MED ORDER — PROPOFOL 10 MG/ML IV BOLUS
INTRAVENOUS | Status: DC | PRN
  Administered 2019-06-12: 140 mg via INTRAVENOUS

## 2019-06-12 MED ORDER — SUGAMMADEX SODIUM 200 MG/2ML IV SOLN
INTRAVENOUS | Status: DC | PRN
  Administered 2019-06-12: 200 mg via INTRAVENOUS

## 2019-06-12 MED ORDER — LIDOCAINE 2% (20 MG/ML) 5 ML SYRINGE
INTRAMUSCULAR | Status: AC
  Filled 2019-06-12: qty 5

## 2019-06-12 MED ORDER — ONDANSETRON HCL 4 MG/2ML IJ SOLN: 4.0000 mg | Freq: Once | INTRAMUSCULAR | Status: DC | PRN

## 2019-06-12 MED ORDER — ROCURONIUM BROMIDE 10 MG/ML (PF) SYRINGE
PREFILLED_SYRINGE | INTRAVENOUS | Status: AC
  Filled 2019-06-12: qty 10

## 2019-06-12 MED ORDER — FENTANYL CITRATE (PF) 100 MCG/2ML IJ SOLN: 25.0000 ug | INTRAMUSCULAR | Status: DC | PRN

## 2019-06-12 MED ORDER — ACETAMINOPHEN 325 MG PO TABS: 325.0000 mg | ORAL_TABLET | ORAL | Status: DC | PRN

## 2019-06-12 MED ORDER — OXYCODONE HCL 5 MG/5ML PO SOLN: 5.0000 mg | Freq: Once | ORAL | Status: DC | PRN

## 2019-06-12 MED ORDER — DEXAMETHASONE SODIUM PHOSPHATE 10 MG/ML IJ SOLN
INTRAMUSCULAR | Status: AC
  Filled 2019-06-12: qty 1

## 2019-06-12 MED ORDER — SODIUM CHLORIDE 0.9 % IR SOLN
Status: DC | PRN
  Administered 2019-06-12 (×2): 3000 mL

## 2019-06-12 MED ORDER — HYDROCODONE-ACETAMINOPHEN 5-325 MG PO TABS: 1.0000 | ORAL_TABLET | ORAL | 0 refills | Status: DC | PRN

## 2019-06-12 MED ORDER — ROCURONIUM BROMIDE 10 MG/ML (PF) SYRINGE
PREFILLED_SYRINGE | INTRAVENOUS | Status: DC | PRN
  Administered 2019-06-12: 40 mg via INTRAVENOUS
  Administered 2019-06-12 (×2): 10 mg via INTRAVENOUS

## 2019-06-12 MED ORDER — MEPERIDINE HCL 50 MG/ML IJ SOLN: 6.2500 mg | INTRAMUSCULAR | Status: DC | PRN

## 2019-06-12 MED ORDER — FENTANYL CITRATE (PF) 250 MCG/5ML IJ SOLN
INTRAMUSCULAR | Status: DC | PRN
  Administered 2019-06-12 (×2): 25 ug via INTRAVENOUS
  Administered 2019-06-12: 50 ug via INTRAVENOUS

## 2019-06-12 MED ORDER — DEXAMETHASONE SODIUM PHOSPHATE 10 MG/ML IJ SOLN
INTRAMUSCULAR | Status: DC | PRN
  Administered 2019-06-12: 4 mg via INTRAVENOUS

## 2019-06-12 MED ORDER — PROPOFOL 10 MG/ML IV BOLUS
INTRAVENOUS | Status: AC
  Filled 2019-06-12: qty 20

## 2019-06-12 MED ORDER — LIDOCAINE 2% (20 MG/ML) 5 ML SYRINGE
INTRAMUSCULAR | Status: DC | PRN
  Administered 2019-06-12: 60 mg via INTRAVENOUS

## 2019-06-12 MED ORDER — LACTATED RINGERS IV SOLN
INTRAVENOUS | Status: DC
  Administered 2019-06-12: 09:00:00 via INTRAVENOUS

## 2019-06-12 MED ORDER — EPHEDRINE SULFATE-NACL 50-0.9 MG/10ML-% IV SOSY
PREFILLED_SYRINGE | INTRAVENOUS | Status: DC | PRN
  Administered 2019-06-12 (×2): 5 mg via INTRAVENOUS

## 2019-06-12 MED ORDER — EPHEDRINE 5 MG/ML INJ
INTRAVENOUS | Status: AC
  Filled 2019-06-12: qty 10

## 2019-06-12 MED ORDER — ONDANSETRON HCL 4 MG/2ML IJ SOLN
INTRAMUSCULAR | Status: DC | PRN
  Administered 2019-06-12: 4 mg via INTRAVENOUS

## 2019-06-12 MED ORDER — ACETAMINOPHEN 160 MG/5ML PO SOLN: 325.0000 mg | ORAL | Status: DC | PRN

## 2019-06-12 SURGICAL SUPPLY — 14 items
BAG URINE DRAINAGE (UROLOGICAL SUPPLIES) IMPLANT
BAG URO CATCHER STRL LF (MISCELLANEOUS) ×2 IMPLANT
COVER WAND RF STERILE (DRAPES) IMPLANT
ELECT REM PT RETURN 15FT ADLT (MISCELLANEOUS) ×2 IMPLANT
GLOVE BIO SURGEON STRL SZ7.5 (GLOVE) ×2 IMPLANT
GOWN STRL REUS W/TWL LRG LVL3 (GOWN DISPOSABLE) ×2 IMPLANT
KIT TURNOVER KIT A (KITS) IMPLANT
LOOP CUT BIPOLAR 24F LRG (ELECTROSURGICAL) IMPLANT
MANIFOLD NEPTUNE II (INSTRUMENTS) ×2 IMPLANT
PACK CYSTO (CUSTOM PROCEDURE TRAY) ×2 IMPLANT
SET ASPIRATION TUBING (TUBING) IMPLANT
SYRINGE IRR TOOMEY STRL 70CC (SYRINGE) IMPLANT
TUBING CONNECTING 10 (TUBING) ×2 IMPLANT
TUBING UROLOGY SET (TUBING) ×2 IMPLANT

## 2019-06-12 NOTE — Anesthesia Procedure Notes (Signed)
Procedure Name: Intubation Date/Time: 06/12/2019 10:19 AM Performed by: Eben Burow, CRNA Pre-anesthesia Checklist: Patient identified, Emergency Drugs available, Suction available, Patient being monitored and Timeout performed Patient Re-evaluated:Patient Re-evaluated prior to induction Oxygen Delivery Method: Circle system utilized Preoxygenation: Pre-oxygenation with 100% oxygen Induction Type: IV induction Ventilation: Mask ventilation without difficulty Laryngoscope Size: Mac and 4 Grade View: Grade I Tube type: Oral Tube size: 7.5 mm Number of attempts: 1 Airway Equipment and Method: Stylet Placement Confirmation: ETT inserted through vocal cords under direct vision,  positive ETCO2 and breath sounds checked- equal and bilateral Secured at: 23 cm Tube secured with: Tape Dental Injury: Teeth and Oropharynx as per pre-operative assessment

## 2019-06-12 NOTE — Transfer of Care (Signed)
Immediate Anesthesia Transfer of Care Note  Patient: Brandon Haynes  Procedure(s) Performed: TRANSURETHRAL RESECTION OF BLADDER TUMOR (TURBT) (N/A Bladder)  Patient Location: PACU  Anesthesia Type:General  Level of Consciousness: awake, alert  and oriented  Airway & Oxygen Therapy: Patient Spontanous Breathing and Patient connected to face mask oxygen  Post-op Assessment: Report given to RN and Post -op Vital signs reviewed and stable  Post vital signs: Reviewed and stable  Last Vitals:  Vitals Value Taken Time  BP    Temp    Pulse    Resp    SpO2      Last Pain:  Vitals:   06/12/19 0909  TempSrc:   PainSc: 1       Patients Stated Pain Goal: 3 [phone]  Complications: No apparent anesthesia complications

## 2019-06-12 NOTE — Anesthesia Postprocedure Evaluation (Signed)
Anesthesia Post Note  Patient: Brandon Haynes  Procedure(s) Performed: TRANSURETHRAL RESECTION OF BLADDER TUMOR (TURBT) (N/A Bladder)     Patient location during evaluation: PACU Anesthesia Type: General Level of consciousness: awake and alert Pain management: pain level controlled Vital Signs Assessment: post-procedure vital signs reviewed and stable Respiratory status: spontaneous breathing, nonlabored ventilation, respiratory function stable and patient connected to nasal cannula oxygen Cardiovascular status: blood pressure returned to baseline and stable Postop Assessment: no apparent nausea or vomiting Anesthetic complications: no    Last Vitals:  Vitals:   06/12/19 1115 06/12/19 1200  BP: 120/74 119/75  Pulse: 69 67  Resp: 14 14  Temp: 36.9 C   SpO2: 95% 96%    Last Pain:  Vitals:   06/12/19 1200  TempSrc:   PainSc: 2                  Shakerria Parran

## 2019-06-12 NOTE — Op Note (Signed)
Operative Note  Preoperative diagnosis:  1.  Bladder tumor--medium  Postoperative diagnosis: 1.  Bladder tumor--medium  Procedure(s): 1.  Transurethral resection of bladder tumor--medium  Surgeon: Link Snuffer, MD  Assistants: None  Anesthesia: General  Complications: None immediate  EBL: Minimal  Specimens: 1.  Bladder tumor  Drains/Catheters: 1.  None  Intraoperative findings: 1.  Normal anterior urethra 2.  Moderately obstructing prostate 3.  Approximately 2.5 cm papillary appearing bladder mass on the left lateral wall was superficially completely resected.  Resection was close to the left ureteral orifice but the ureteral orifice itself was uninvolved in the resection.  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  Description of procedure:  The patient was identified and consent was obtained.  The patient was taken to the operating room and placed in the supine position.  The patient was placed under general anesthesia.  Perioperative antibiotics were administered.  The patient was placed in dorsal lithotomy.  Patient was prepped and draped in a standard sterile fashion and a timeout was performed.  A 26 French resectoscope with a visual obturator in place was advanced into the urethra and into the bladder.  Complete cystoscopy was performed with the findings noted above.  I exchanged this for the working element and on bipolar settings proceeded to resect the mass of interest down to muscle fibers.  Once the entire tumor was completely resected, I fulgurated the resection bed.  All bladder specimen was collected.  I inspected the bladder mucosa and no other tumors were seen.  There was no active bleeding noted.  I drained the bladder and withdrew the scope.  This concluded the operation.  The patient tolerated the  procedure well was stable postoperatively.  Plan: Return in 1 week for pathology review

## 2019-06-12 NOTE — Discharge Instructions (Signed)

## 2019-06-12 NOTE — H&P (Signed)
CC/HPI: He CC: Bladder tumor  HPI:  05/24/2019  78 year old male who experienced a several day episode of gross hematuria. For this reason, he underwent a CT IVP on 05/06/2019. This revealed a 2.5 cm lesion in the bladder that appeared to extend through the entire thickness of the bladder. He also had a 3 mm nonobstructive calculus in the lower pole of the right kidney. He smoked cigarettes for 40 years. He has cardiovascular disease and had stents placed in 2016. He occasionally takes nitroglycerin. He takes aspirin 81 but no other blood thinners.     ALLERGIES: None   MEDICATIONS: Allopurinol 300 mg tablet  Metoprolol Tartrate 25 mg tablet  Acetaminophen 500 mg tablet  Amlodipine Besylate 5 mg tablet  Aspir 81  Clotrimazole-Betamethasone  Desonide  Diphenhydramine Hcl  Epinephrine 0.3 mg/0.3 ml auto-injector  Fluticasone Propionate  Gabapentin 300 mg capsule  Loratadine 10 mg tablet  Losartan Potassium 50 mg tablet  Nitroglycerin  Pantoprazole Sodium 40 mg tablet, delayed release  Prevident  Rosuvastatin Calcium 20 mg tablet  Sildenafil Citrate 100 mg tablet  Tramadol Hcl 50 mg tablet     GU PSH: None   NON-GU PSH: Cardiac Stent Placement Rotator cuff surgery, Left     GU PMH: None   NON-GU PMH: Anxiety Arthritis GERD Gout Heart disease, unspecified Hypercholesterolemia Hypertension    FAMILY HISTORY: 3 daughters - Other Cancer - Mother Diabetes - Mother heart failure - Mother Lung Cancer - Father   SOCIAL HISTORY: Marital Status: Married Preferred Language: English; Race: White Current Smoking Status: Patient does not smoke anymore. Has not smoked since 04/29/1995.   Tobacco Use Assessment Completed: Used Tobacco in last 30 days? Drinks 3 caffeinated drinks per day.    REVIEW OF SYSTEMS:    GU Review Male:   Patient reports get up at night to urinate, stream starts and stops, and erection problems. Patient denies frequent urination, hard to postpone  urination, burning/ pain with urination, leakage of urine, trouble starting your stream, have to strain to urinate , and penile pain.  Gastrointestinal (Upper):   Patient denies nausea, vomiting, and indigestion/ heartburn.  Gastrointestinal (Lower):   Patient denies diarrhea and constipation.  Constitutional:   Patient reports fatigue. Patient denies fever, night sweats, and weight loss.  Skin:   Patient reports skin rash/ lesion and itching.   Eyes:   Patient reports blurred vision. Patient denies double vision.  Ears/ Nose/ Throat:   Patient reports sinus problems. Patient denies sore throat.  Hematologic/Lymphatic:   Patient reports easy bruising. Patient denies swollen glands.  Cardiovascular:   Patient denies leg swelling and chest pains.  Respiratory:   Patient denies cough and shortness of breath.  Endocrine:   Patient reports excessive thirst.   Musculoskeletal:   Patient reports back pain. Patient denies joint pain.  Neurological:   Patient denies headaches and dizziness.  Psychologic:   Patient denies depression and anxiety.   Notes: Hematuria Weak stream     VITAL SIGNS:      05/24/2019 03:07 PM  Weight 216 lb / 97.98 kg  Height 74 in / 187.96 cm  BP 155/90 mmHg  Heart Rate 58 /min  Temperature 98.2 F / 36.7 C  BMI 27.7 kg/m   GU PHYSICAL EXAMINATION:    Penis: Circumcised, no warts, no cracks. No dorsal Peyronie's plaques, no left corporal Peyronie's plaques, no right corporal Peyronie's plaques, no scarring, no warts. No balanitis, no meatal stenosis.   MULTI-SYSTEM PHYSICAL EXAMINATION:  Constitutional: Well-nourished. No physical deformities. Normally developed. Good grooming.  Respiratory: No labored breathing, no use of accessory muscles.   Cardiovascular: Normal temperature, adequate perfusion of extremities  Skin: No paleness, no jaundice  Neurologic / Psychiatric: Oriented to time, oriented to place, oriented to person. No depression, no anxiety, no  agitation.  Gastrointestinal: No mass, no tenderness, no rigidity, non obese abdomen.  Eyes: Normal conjunctivae. Normal eyelids.  Musculoskeletal: Normal gait and station of head and neck.     PAST DATA REVIEWED:  Source Of History:  Patient  Records Review:   Previous Patient Records  X-Ray Review: C.T. Abdomen/Pelvis: Reviewed Films. Reviewed Report. Discussed With Patient.     PROCEDURES:         Flexible Cystoscopy - 52000  Risks, benefits, and some of the potential complications of the procedure were discussed at length with the patient including infection, bleeding, voiding discomfort, urinary retention, fever, chills, sepsis, and others. All questions were answered. Informed consent was obtained. Antibiotic prophylaxis was given. Sterile technique and intraurethral analgesia were used.  Meatus:  Normal size. Normal location. Normal condition.  Urethra:  No strictures.  External Sphincter:  Normal.  Verumontanum:  Normal.  Prostate:  Obstructing. Moderate hyperplasia.  Bladder Neck:  Non-obstructing.  Ureteral Orifices:  Normal location. Normal size. Normal shape.   Bladder:  A little bit over 2 cm left posterior lateral wall tumor papillary in nature.      The lower urinary tract was carefully examined. The procedure was well-tolerated and without complications. Antibiotic instructions were given. Instructions were given to call the office immediately for bloody urine, difficulty urinating, urinary retention, painful or frequent urination, fever, chills, nausea, vomiting or other illness. The patient stated that he understood these instructions and would comply with them.         Urinalysis w/Scope - 81001 Dipstick Dipstick Cont'd Micro  Color: Yellow Bilirubin: Neg WBC/hpf: 0 - 5/hpf  Appearance: Slightly Cloudy Ketones: Neg RBC/hpf: 40 - 60/hpf  Specific Gravity: 1.010 Blood: 2+ Bacteria: NS (Not Seen)  pH: 7.5 Protein: Neg Cystals: NS (Not Seen)  Glucose: Neg  Urobilinogen: 0.2 Casts: NS (Not Seen)    Nitrites: Neg Trichomonas: Not Present    Leukocyte Esterase: Neg Mucous: Not Present      Epithelial Cells: 0 - 5/hpf      Yeast: NS (Not Seen)      Sperm: Not Present    Notes:      ASSESSMENT:      ICD-10 Details  1 GU:   Bladder tumor/neoplasm - D41.4   2   Gross hematuria - R31.0    PLAN:           Document Letter(s):  Created for Patient: Clinical Summary         Notes:   Recommend transurethral resection of bladder tumor. Given the appearance of possible extension into the bladder wall, we will not instill gemcitabine. He understands potential for bleeding, infection, injury to surrounding structures including bladder perforation.   CC: Dr. Melford Aase, M.D   Signed by Link Snuffer, III, M.D. on 05/25/19 at 10:33 AM (EDT

## 2019-06-13 ENCOUNTER — Encounter (HOSPITAL_COMMUNITY): Payer: Self-pay | Admitting: Urology

## 2019-06-14 ENCOUNTER — Encounter: Payer: Self-pay | Admitting: Family Medicine

## 2019-06-18 ENCOUNTER — Telehealth: Payer: Self-pay | Admitting: Student in an Organized Health Care Education/Training Program

## 2019-06-18 NOTE — Telephone Encounter (Signed)
Patient has had dysuria and frequency since urologic procedure this week. No fevers/chlils. Advised him to call tomorrow morning and make an appointment to leave UA/urine culture with [facility] to rule out infection. If worsens tonight or develops fevers, then recommended he go to ED. He was appreciative of call and agreed with plan.

## 2019-06-19 DIAGNOSIS — C672 Malignant neoplasm of lateral wall of bladder: Secondary | ICD-10-CM | POA: Diagnosis not present

## 2019-06-19 DIAGNOSIS — R350 Frequency of micturition: Secondary | ICD-10-CM | POA: Diagnosis not present

## 2019-06-21 DIAGNOSIS — C672 Malignant neoplasm of lateral wall of bladder: Secondary | ICD-10-CM | POA: Diagnosis not present

## 2019-06-22 ENCOUNTER — Telehealth: Payer: Self-pay | Admitting: Oncology

## 2019-06-22 DIAGNOSIS — J3089 Other allergic rhinitis: Secondary | ICD-10-CM | POA: Diagnosis not present

## 2019-06-22 DIAGNOSIS — J301 Allergic rhinitis due to pollen: Secondary | ICD-10-CM | POA: Diagnosis not present

## 2019-06-22 NOTE — Telephone Encounter (Signed)
Received a new patient referral from Dr. Gloriann Loan at [facility] for bladder cancer. [name] has been scheduled to see Dr. Alen Blew on 9/25 at 11am. Appt date and time has been given to the pt's wife. Aware her husband should arrive 15 minutes early.

## 2019-06-23 ENCOUNTER — Inpatient Hospital Stay: Payer: Medicare HMO | Attending: Oncology | Admitting: Oncology

## 2019-06-23 ENCOUNTER — Other Ambulatory Visit: Payer: Self-pay

## 2019-06-23 VITALS — BP 99/62 | HR 60 | Temp 98.3°F | Resp 17 | Ht 74.0 in | Wt 216.8 lb

## 2019-06-23 DIAGNOSIS — C679 Malignant neoplasm of bladder, unspecified: Secondary | ICD-10-CM | POA: Diagnosis not present

## 2019-06-23 NOTE — Progress Notes (Signed)
Reason for the request: Bladder cancer     HPI: I was asked by Dr. Gloriann Loan to evaluate Brandon Haynes for the evaluation of bladder cancer.  He is a 78 year old man with a history of coronary disease and hypertension but otherwise in reasonably good health.  He started developing symptoms of hematuria in August 2020.  Abdominal CT obtained on 05/06/2019 showed 2.5 x 1.5 x 1.4 cm lesion in the urinary bladder and is suspicious for malignancy.  3 mm nonobstructive calculus was noted in the lower pole of the collecting system.  Based on these findings he was evaluated by Dr. Gloriann Loan and underwent a TURBT on 06/12/2019.  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  Based on these findings he was referred to me for evaluation regarding options of treatment.  Postoperatively, he did develop symptoms of frequency and dysuria and was diagnosed with urinary tract infection.  He is finishing a course of antibiotics at this time.  He reports otherwise urination has improved with no longer reporting any hematuria, dysuria or nocturia.  He is able to empty bladder more successfully.  He remains active and continues to attend activities of daily living.  He does not report any headaches, blurry vision, syncope or seizures. Does not report any fevers, chills or sweats.  Does not report any cough, wheezing or hemoptysis.  Does not report any chest pain, palpitation, orthopnea or leg edema.  Does not report any nausea, vomiting or abdominal pain.  Does not report any constipation or diarrhea.  Does not report any skeletal complaints.    Does not report frequency, urgency or hematuria.  Does not report any skin rashes or lesions. Does not report any heat or cold intolerance.  Does not report any lymphadenopathy or petechiae.  Does not report any anxiety or depression.  Remaining review of systems is negative.    Past Medical History:  Diagnosis Date  . Allergic  rhinitis    allergy shots via [place]  . Anxiety   . Bladder cancer ([place]) 05/2019   INVASIVE HIGH GRADE PAPILLARY UROTHELIAL CARCINOMA, invading muscularis propia  . 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 CAD, multiple vessel    a. CP/near-syncope 11/2014 s/p DES to MLAD, occluded RCA after takeoff of RV marginal with collaterals treated medically, otherwise mild nonobstructive disease. EF 55%;  b. 07/2015 Lexiscan CL: EF 56%, no ischemia/infarct.   . Cataracts, both eyes   . Chronic low back pain   . DDD (degenerative disc disease), lumbar   . GERD (gastroesophageal reflux disease)    PPI bid needed to control sx's  . Gout   . Hearing loss   . History  of basal cell carcinoma   . History of adenomatous polyp of colon    Rpt colonoscopy recommended 02/2020->no further if no high risk lesions at that time.  . Hypercholesteremia   . Hypertension    Hx labile/uncontrolled.  Improved s/p visit to HTN clinic 2019.  . Iron deficiency anemia 2017   Colonoscopy, EGD, and capsule endoscopy w/out obvious cause/source found.  Oral iron helping as of 03/2016.  . Leg pain 03/06/2016  . Nonmelanoma skin cancer   . 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 Peripheral vascular disease ([place])   . Pleural thickening    chronic  :  Past Surgical History:  Procedure Laterality Date  . back injection  oct 2011, 07/2010, 12/2012   no surgery  . Capsule endoscopy  02/2016  mild duodenitis, o/w NEG  . CARDIOVASCULAR STRESS TEST  05/2009; 07/2015  . carotid dopplers  11/2014   Bilateral - 1% to 9% ICA stenosis. Vertebral artery flow is  . COLONOSCOPY  08/2008; 02/2016; 02/2017   2017: 12 polyps (adenomatous).  02/2017 repeat TCS--multiple polyps: recall 3 yrs.  . CORONARY ANGIOPLASTY WITH STENT PLACEMENT  12/04/2014   4.0 x 12 mm Synergy stent to the mid LAD  . ESOPHAGOGASTRODUODENOSCOPY  02/2016   Gastritis.  NEG h pylori.    . LE arterial vascular study  02/2016   Per Dr. Radford Pax, no evidence of  peripheral vascular disease  . LEFT HEART CATHETERIZATION WITH CORONARY ANGIOGRAM N/A 12/04/2014   Procedure: LEFT HEART CATHETERIZATION WITH CORONARY ANGIOGRAM;  Surgeon: Jolaine Artist, MD; LAD 99% then aneurysmal, OM1 40%, CFX 50%, RCA 40%, 50% then occluded  . ROTATOR CUFF REPAIR Left 09/2013   left  . SHOULDER INJECTION  feb 2014  . TRANSTHORACIC ECHOCARDIOGRAM  11/2014   EF 50-55%, some wall motion abnormalities noted, grade I diast dysfxn  . TRANSURETHRAL RESECTION OF BLADDER TUMOR N/A 06/12/2019   INVASIVE HIGH GRADE PAPILLARY UROTHELIAL CARCINOMA, invading muscularis propria.  Procedure: TRANSURETHRAL RESECTION OF BLADDER TUMOR (TURBT);  Surgeon: Lucas Mallow, MD;  Location: [facility];  Service: Urology;  Laterality: N/A;  :   Current Outpatient Medications:  .  acetaminophen (TYLENOL) 500 MG tablet, Take 500 mg by mouth every 6 (six) hours as needed for moderate pain., Disp: , Rfl:  .  allopurinol (ZYLOPRIM) 300 MG tablet, Take 1 tablet (300 mg total) by mouth daily., Disp: 90 tablet, Rfl: 0 .  amLODipine (NORVASC) 5 MG tablet, Take 1 tablet (5 mg total) by mouth daily. (Patient taking differently: Take 5 mg by mouth every evening. ), Disp: 90 tablet, Rfl: 1 .  aspirin 81 MG tablet, Take 81 mg by mouth daily.  , Disp: , Rfl:  .  desonide (DESOWEN) 0.05 % cream, Apply 1 application topically 3 (three) times a week. , Disp: , Rfl: 1 .  diphenhydrAMINE (BENADRYL) 25 MG tablet, Take 50 mg by mouth 3 (three) times daily as needed for itching or allergies. , Disp: , Rfl:  .  EPINEPHrine 0.3 mg/0.3 mL IJ SOAJ injection, Inject 0.3 mg into the muscle as needed for anaphylaxis. , Disp: , Rfl:  .  fluticasone (FLONASE) 50 MCG/ACT nasal spray, Place 1 spray into both nostrils daily., Disp: , Rfl:  .  gabapentin (NEURONTIN) 300 MG capsule, Take 300 mg by mouth 3 (three) times daily. , Disp: , Rfl:  .  HYDROcodone-acetaminophen (NORCO) 5-325 MG tablet, Take 1 tablet by mouth every 4 (four)  hours as needed for moderate pain., Disp: 10 tablet, Rfl: 0 .  loratadine (CLARITIN) 10 MG tablet, Take 20 mg by mouth daily. , Disp: , Rfl:  .  losartan (COZAAR) 50 MG tablet, TAKE 1 TABLET BY MOUTH TWICE A DAY (Patient taking differently: Take 50 mg by mouth 2 (two) times daily. ), Disp: 60 tablet, Rfl: 11 .  metoprolol tartrate (LOPRESSOR) 25 MG tablet, Take 1 tablet (25 mg total) by mouth 2 (two) times daily., Disp: 180 tablet, Rfl: 3 .  nitroGLYCERIN (NITROSTAT) 0.4 MG SL tablet, Place 1 tablet (0.4 mg total) under the tongue every 5 (five) minutes as needed for chest pain., Disp: 25 tablet, Rfl: 6 .  pantoprazole (PROTONIX) 40 MG tablet, Take 1 tablet (40 mg total) by mouth daily., Disp: 90 tablet, Rfl: 3 .  [place]  5000 BOOSTER PLUS 1.1 % PSTE, Take 1 application by mouth at bedtime. , Disp: , Rfl: 1 .  rosuvastatin (CRESTOR) 20 MG tablet, Take 1 tablet (20 mg total) by mouth daily. (Patient taking differently: Take 20 mg by mouth every evening. ), Disp: 90 tablet, Rfl: 1 .  sildenafil (VIAGRA) 100 MG tablet, Take 1 tablet (100 mg total) by mouth daily as needed for erectile dysfunction., Disp: 10 tablet, Rfl: 6 .  traMADol (ULTRAM) 50 MG tablet, Take 1 tablet (50 mg total) by mouth 3 (three) times daily as needed. for pain, Disp: 90 tablet, Rfl: 5:  No Known Allergies:  Family History  Problem Relation Age of Onset  . Lung cancer Father   . Heart disease Father   . Breast cancer Mother   . Congestive Heart Failure Mother   . Diabetes Mother   . Breast cancer Sister   . Heart disease Brother   . COPD Brother   . Colon cancer Neg Hx   . Esophageal cancer Neg Hx   . Pancreatic cancer Neg Hx   . Rectal cancer Neg Hx   . Stomach cancer Neg Hx   . Prostate cancer Neg Hx   :  Social History   Socioeconomic History  . Marital status: Married    Spouse name: Vaughan Basta  . Number of children: 3  . Years of education: Not on file  . Highest education level: Not on file  Occupational  History  . Occupation: retired  Scientific laboratory technician  . Financial resource strain: Not on file  . Food insecurity    Worry: Not on file    Inability: Not on file  . Transportation needs    Medical: Not on file    Non-medical: Not on file  Tobacco Use  . Smoking status: Former Smoker    Packs/day: 2.00    Years: 35.00    Pack years: 70.00    Types: Cigarettes    Quit date: 09/28/1998    Years since quitting: 20.7  . Smokeless tobacco: Former Systems developer    Types: Snuff    Quit date: 02/09/1986  . Tobacco comment: as a teenager  Substance and Sexual Activity  . Alcohol use: No    Alcohol/week: 0.0 standard drinks    Comment: quit 1994  . Drug use: No  . Sexual activity: Not on file  Lifestyle  . Physical activity    Days per week: Not on file    Minutes per session: Not on file  . Stress: Not on file  Relationships  . Social Herbalist on phone: Not on file    Gets together: Not on file    Attends religious service: Not on file    Active member of club or organization: Not on file    Attends meetings of clubs or organizations: Not on file    Relationship status: Not on file  . Intimate partner violence    Fear of current or ex partner: Not on file    Emotionally abused: Not on file    Physically abused: Not on file    Forced sexual activity: Not on file  Other Topics Concern  . Not on file  Social History Narrative   Married, 3 children.   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   Orig from [place], lived [place] same farm all his life.   Former smoker: quit approx around [place].  80 pack-yr hx.   Alcohol-none, used to drink a  lot of beer on weekends.  No drugs.   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   Diet: fair  :  Pertinent items are noted in HPI.  Exam: Blood pressure 99/62, pulse 60, temperature 98.3 F (36.8 C), temperature source Oral, resp. rate 17, height 6\' 2"  (1.88 m), weight 216 lb 12.8 oz (98.3 kg), SpO2 99 %.  ECOG  1 General appearance: alert and cooperative appeared without distress. Head: atraumatic without any abnormalities. Eyes: conjunctivae/corneas clear. PERRL.  Sclera anicteric. Throat: lips, mucosa, and tongue normal; without oral thrush or ulcers. Resp: clear to auscultation bilaterally without rhonchi, wheezes or dullness to percussion. Cardio: regular rate and rhythm, S1, S2 normal, no murmur, click, rub or gallop GI: soft, non-tender; bowel sounds normal; no masses,  no organomegaly Skin: Skin color, texture, turgor normal. No rashes or lesions Lymph nodes: Cervical, supraclavicular, and axillary nodes normal. Neurologic: Grossly normal without any motor, sensory or deep tendon reflexes. Musculoskeletal: No joint deformity or effusion.  CBC    Component Value Date/Time   WBC 8.3 06/06/2019 1054   RBC 4.92 06/06/2019 1054   HGB 15.6 06/06/2019 1054   HGB 11.8 (L) 04/18/2015 0938   HCT 46.5 06/06/2019 1054   HCT 36.8 (L) 04/18/2015 0938   PLT 175 06/06/2019 1054   MCV 94.5 06/06/2019 1054   MCV 82 04/18/2015 0938   MCH 31.7 06/06/2019 1054   MCHC 33.5 06/06/2019 1054   RDW 13.2 06/06/2019 1054   RDW 15.6 (H) 04/18/2015 0938   LYMPHSABS 2,549 05/04/2019 1633   MONOABS 0.7 08/31/2018 1058   EOSABS 367 05/04/2019 1633   BASOSABS 43 05/04/2019 1633     Chemistry      Component Value Date/Time   NA 143 06/06/2019 1054   NA 143 03/13/2019 1141   K 4.5 06/06/2019 1054   CL 105 06/06/2019 1054   CO2 29 06/06/2019 1054   BUN 14 06/06/2019 1054   BUN 11 03/13/2019 1141   CREATININE 0.97 06/06/2019 1054   CREATININE 1.03 05/04/2019 1633      Component Value Date/Time   CALCIUM 9.8 06/06/2019 1054   ALKPHOS 47 03/13/2019 1141   AST 20 03/13/2019 1141   ALT 12 03/13/2019 1141   BILITOT 0.7 03/13/2019 1141     EXAM: CT ABDOMEN AND PELVIS WITHOUT AND WITH CONTRAST  TECHNIQUE: Multidetector CT imaging of the abdomen and pelvis was performed following the standard protocol  before and following the bolus administration of intravenous contrast.  CONTRAST:  128mL OMNIPAQUE IOHEXOL 300 MG/ML  SOLN  COMPARISON:  No priors.  FINDINGS: Lower chest: Atherosclerotic calcifications in the descending thoracic aorta as well as the left main, left anterior descending, left circumflex and right coronary arteries. Severe calcifications of the mitral annulus. Mild calcifications of the aortic valve.  Hepatobiliary: Calcified granuloma in segment 2 of the liver. No suspicious cystic or solid hepatic lesions. No intra or extrahepatic biliary ductal dilatation. Gallbladder is normal in appearance.  Pancreas: No pancreatic mass. No pancreatic ductal dilatation. No pancreatic or peripancreatic fluid collections or inflammatory changes.  Spleen: Unremarkable.  Adrenals/Urinary Tract: 3 mm nonobstructive calculus in the lower pole collecting system of the right kidney. No additional calculi are noted within the collecting system of the left kidney, along the course of either ureter or within the lumen of the urinary bladder. No hydroureteronephrosis. Exophytic 1.2 cm low-attenuation lesion in the lateral aspect of the upper pole the left kidney. No other suspicious renal lesions. Bilateral  adrenal glands are normal in appearance. Postcontrast delayed images demonstrate may filling defect measuring 2.1 x 1.5 x 1.4 cm in the left side of the urinary bladder adjacent to the left ureterovesicular junction (axial image 97 of series 12 and sagittal image 79 of series 15) which corresponds to some enhancing soft tissue on early postcontrast imaging which clearly involves the adjacent urinary bladder wall (axial image 90 of series 7), concerning for urothelial neoplasm.  Stomach/Bowel: Normal appearance of the stomach. No pathologic dilatation of small bowel or colon. Normal appearance of the stomach. No pathologic dilatation of small bowel or colon. Numerous colonic  diverticulae are noted, particularly in the sigmoid colon, without surrounding inflammatory changes to suggest an acute diverticulitis at this time. Normal appendix.  Vascular/Lymphatic: Aortic atherosclerosis with fusiform ectasia of the infrarenal abdominal aorta measuring 2.8 x 2.5 cm. No lymphadenopathy noted in the abdomen or pelvis.  Reproductive: Prostate gland and seminal vesicles are unremarkable in appearance.  Other: Trace volume of ascites.  No pneumoperitoneum.  Musculoskeletal: There are no aggressive appearing lytic or blastic lesions noted in the visualized portions of the skeleton.  IMPRESSION: 1. 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 2. 3 mm nonobstructive calculus in the lower pole collecting system of the right kidney. No ureteral stones or findings of urinary tract obstruction are noted at this time. 3. Colonic diverticulosis without evidence of acute diverticulitis at this time. 4. Aortic atherosclerosis, in addition to left main and 3 vessel coronary artery disease. 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 5. There are calcifications of the aortic valve and mitral annulus. Echocardiographic correlation for evaluation of potential valvular dysfunction may be warranted if clinically indicated.   Assessment and Plan:    78 year old man with:  1.  Bladder cancer diagnosed in August 2020.  He presented with bladder mass measuring 2.5 x 1.5 cm and the hematuria.  TURBT obtained on 06/12/2019 showed invasive high-grade papillary adenocarcinoma with muscle invasion.  He has no evidence of metastatic disease at this time.  The natural course of this disease as well as treatment options were reviewed.  Definitive therapy with  radical cystectomy remains the standard of care in this particular setting especially in the setting of reasonable health and reasonable candidacy.  The logistics of neoadjuvant chemotherapy administration followed by radical cystectomy was discussed today.  Alternative options would include definitive therapy with radiation as well as weekly chemotherapy.  Risks and benefits associated with this approach versus surgery was reviewed today.  Potential complications associated with this therapy to include femoral pain, diarrhea, diarrhea as well as infusion related complications.  Myelosuppression, neuropathy as well as GI complaints are less likely.  The role of a salvage of surgery after definitive radiation therapy if he develops relapse is considered less that than ideal with increased risk of surgery.  We also discussed the role of systemic chemotherapy if he develops relapsed disease which could happen regardless of surgery or radiation.  After discussion today, he is interested in receiving definitive therapy with radiation and deferring surgical option.  I will make the appropriate referral to radiation oncology for an evaluation.  Once his radiation therapy is set up, we will set up a chemotherapy education class as well as weekly carboplatin associated with his radiation therapy  appointments.  2.  IV access: Peripheral veins will be used for his chemotherapy infusion.  3.  Antiemetics: Prescription for Compazine will be available to him.  4.  Follow-up: Will be determined by his radiation appointments.  60  minutes was spent with the patient face-to-face today.  More than 50% of time was spent on reviewing his imaging studies, pathology reports, treatment options and complications related therapy.   Thank you for the referral.  A copy of this consult has been forwarded to the requesting physician.

## 2019-06-26 ENCOUNTER — Telehealth: Payer: Self-pay | Admitting: Oncology

## 2019-06-26 NOTE — Progress Notes (Signed)
GU Location of Tumor / Histology: Invasive high grade papillary urothelial cancer invading into muscularis propria.  Gwinda Maine presented with hematuria in August 2020. TURBT was performed on June 12, 2019.     Past/Anticipated interventions by urology, if any: TURBT, referral to [facility] for chemotherapy and Dr. Tammi Klippel for radiation therapy  Past/Anticipated interventions by medical oncology, if any: concurrent chemoradiation with cisplatin  Weight changes, if any: Denies  Bowel/Bladder complaints, if any: Denies dysuria. Denies hematuria. Denies bowel complaints. Reports taking tamsulosin 0.4 mg a few hours before bed. Reports he completed antibiotics on Sunday for UTI.   Nausea/Vomiting, if any: Denies  Pain issues, if any:  Reports chronic lumbar spine pain related to DJD and arthritis.  SAFETY ISSUES:  Prior radiation? denies  Pacemaker/ICD? denies  Possible current pregnancy? no, male patient  Is the patient on methotrexate? denies  Current Complaints / other details:  78 year old male. Married with three children. Retired.

## 2019-06-26 NOTE — Telephone Encounter (Signed)
No los nor sch message per 9/25.

## 2019-06-27 ENCOUNTER — Encounter: Payer: Self-pay | Admitting: Radiation Oncology

## 2019-06-27 ENCOUNTER — Ambulatory Visit
Admission: RE | Admit: 2019-06-27 | Discharge: 2019-06-27 | Disposition: A | Discharge status: Home / Self Care | Payer: Medicare HMO | Source: Ambulatory Visit | Attending: Radiation Oncology | Admitting: Radiation Oncology

## 2019-06-27 ENCOUNTER — Other Ambulatory Visit: Payer: Self-pay

## 2019-06-27 DIAGNOSIS — C679 Malignant neoplasm of bladder, unspecified: Secondary | ICD-10-CM | POA: Insufficient documentation

## 2019-06-27 DIAGNOSIS — J301 Allergic rhinitis due to pollen: Secondary | ICD-10-CM | POA: Diagnosis not present

## 2019-06-27 DIAGNOSIS — J3089 Other allergic rhinitis: Secondary | ICD-10-CM | POA: Diagnosis not present

## 2019-06-27 DIAGNOSIS — Z808 Family history of malignant neoplasm of other organs or systems: Secondary | ICD-10-CM | POA: Diagnosis not present

## 2019-06-27 NOTE — Progress Notes (Signed)
Radiation Oncology         ([phone]) [phone] ________________________________  Initial outpatient Consultation - Conducted via MyChart due to current COVID-19 concerns for limiting patient exposure  Name: WYLLIAM HANEK MRN: [id number]  Date: 06/27/2019  DOB: 05-02-1941  CC:McGowen, [name], MD  Wyatt Portela, MD   REFERRING PHYSICIAN: Wyatt Portela, MD  DIAGNOSIS: 78 y.o. 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    ICD-10-CM   1. Invasive carcinoma of urinary bladder (HCC)  C67.9     HISTORY OF PRESENT ILLNESS: TEJAY ZANFARDINO is a 78 y.o. male with a new diagnosis of muscle invasive bladder cancer. He presented to his PCP, Dr. Anitra Lauth, on 05/04/2019 with a 3 day history of gross hematuria and lower back pain. He had a CT A/P on 05/06/2019 for further evaluation and this showed a 2.5 cm lesion in the urinary bladder wall with enhancement appearing to extend through the entire thickness of the urinary bladder wall but no associated abdominopelvic lymphadenopathy or osseous metastasis. Additionally, there was a 3 mm nonobstructive calculus in the lower pole of the right kidney.  Accordingly, he was referred to Dr. Gloriann Loan on 05/24/2019, who performed in-office cystoscopy that same day. This showed an obstructing prostate with moderate hyperplasia and a 2 cm left posterior lateral bladder wall tumor that appeared papillary in nature.  He proceeded to TURBT for maximal tumor resection with Dr. Gloriann Loan on 06/19/2019 with final pathology confirming high grade invasive papillary urothelial carcinoma invading into the muscularis propria.   He was referred to Dr. Alen Blew on 06/23/2019 to discuss potential options involving systemic chemotherapy, either neoadjuvant prior to radical cystectomy versus concurrent chemoradiation for bladder sparring treatment approach. He is also scheduled for consult with Dr. Tresa Moore on 07/11/19 to further discuss radical  cystectomy.  The patient reviewed the pathology results with his urologist and he has kindly been referred today for discussion of potential radiation treatment options.  PREVIOUS RADIATION THERAPY: No  PAST MEDICAL HISTORY:  Past Medical History:  Diagnosis Date   Allergic rhinitis    allergy shots via [place]   Anxiety    Bladder cancer ([place]) 05/2019   INVASIVE HIGH GRADE PAPILLARY UROTHELIAL CARCINOMA, invading muscularis propia   Bradycardia, drug induced 09/08/2016   CAD, multiple vessel    a. CP/near-syncope 11/2014 s/p DES to MLAD, occluded RCA after takeoff of RV marginal with collaterals treated medically, otherwise mild nonobstructive disease. EF 55%;  b. 07/2015 Lexiscan CL: EF 56%, no ischemia/infarct.    Cataracts, both eyes    Chronic low back pain    DDD (degenerative disc disease), lumbar    GERD (gastroesophageal reflux disease)    PPI bid needed to control sx's   Gout    Hearing loss    History  of basal cell carcinoma    History of adenomatous polyp of colon    Rpt colonoscopy recommended 02/2020->no further if no high risk lesions at that time.   Hypercholesteremia    Hypertension    Hx labile/uncontrolled.  Improved s/p visit to HTN clinic 2019.   Iron deficiency anemia 2017   Colonoscopy, EGD, and capsule endoscopy w/out obvious cause/source found.  Oral iron helping as of 03/2016.   Leg pain 03/06/2016   Nonmelanoma skin cancer    Osteoarthritis of multiple joints 10/18/2014   Peripheral vascular disease ([place])    Pleural thickening    chronic      PAST SURGICAL HISTORY: Past Surgical  History:  Procedure Laterality Date   back injection  oct 2011, 07/2010, 12/2012   no surgery   Capsule endoscopy  02/2016   mild duodenitis, o/w NEG   CARDIOVASCULAR STRESS TEST  05/2009; 07/2015   carotid dopplers  11/2014   Bilateral - 1% to 9% ICA stenosis. Vertebral artery flow is   COLONOSCOPY  08/2008; 02/2016; 02/2017   2017: 12 polyps  (adenomatous).  02/2017 repeat TCS--multiple polyps: recall 3 yrs.   CORONARY ANGIOPLASTY WITH STENT PLACEMENT  12/04/2014   4.0 x 12 mm Synergy stent to the mid LAD   ESOPHAGOGASTRODUODENOSCOPY  02/2016   Gastritis.  NEG h pylori.     LE arterial vascular study  02/2016   Per Dr. Radford Pax, no evidence of peripheral vascular disease   LEFT HEART CATHETERIZATION WITH CORONARY ANGIOGRAM N/A 12/04/2014   Procedure: LEFT HEART CATHETERIZATION WITH CORONARY ANGIOGRAM;  Surgeon: Jolaine Artist, MD; LAD 99% then aneurysmal, OM1 40%, CFX 50%, RCA 40%, 50% then occluded   ROTATOR CUFF REPAIR Left 09/2013   left   SHOULDER INJECTION  feb 2014   TRANSTHORACIC ECHOCARDIOGRAM  11/2014   EF 50-55%, some wall motion abnormalities noted, grade I diast dysfxn   TRANSURETHRAL RESECTION OF BLADDER TUMOR N/A 06/12/2019   INVASIVE HIGH GRADE PAPILLARY UROTHELIAL CARCINOMA, invading muscularis propria.  Procedure: TRANSURETHRAL RESECTION OF BLADDER TUMOR (TURBT);  Surgeon: Lucas Mallow, MD;  Location: [facility];  Service: Urology;  Laterality: N/A;    FAMILY HISTORY:  Family History  Problem Relation Age of Onset   Lung cancer Father    Heart disease Father    Breast cancer Mother    Congestive Heart Failure Mother    Diabetes Mother    Breast cancer Sister    Heart disease Brother    COPD Brother    Bladder Cancer Maternal Grandfather    Colon cancer Neg Hx    Esophageal cancer Neg Hx    Pancreatic cancer Neg Hx    Rectal cancer Neg Hx    Stomach cancer Neg Hx    Prostate cancer Neg Hx     SOCIAL HISTORY:  Social History   Socioeconomic History   Marital status: Married    Spouse name: Vaughan Basta   Number of children: 3   Years of education: Not on file   Highest education level: Not on file  Occupational History   Occupation: retired  Scientist, product/process development strain: Not on file   Food insecurity    Worry: Not on file    Inability: Not on Energy manager needs    Medical: Not on file    Non-medical: Not on file  Tobacco Use   Smoking status: Former Smoker    Packs/day: 2.00    Years: 35.00    Pack years: 70.00    Types: Cigarettes    Quit date: 09/28/1998    Years since quitting: 20.7   Smokeless tobacco: Former Systems developer    Types: Snuff    Quit date: 02/09/1986   Tobacco comment: as a teenager  Substance and Sexual Activity   Alcohol use: No    Alcohol/week: 0.0 standard drinks    Comment: quit 1994   Drug use: No   Sexual activity: Not Currently  Lifestyle   Physical activity    Days per week: Not on file    Minutes per session: Not on file   Stress: Not on file  Relationships   Social connections  Talks on phone: Not on file    Gets together: Not on file    Attends religious service: Not on file    Active member of club or organization: Not on file    Attends meetings of clubs or organizations: Not on file    Relationship status: Not on file   Intimate partner violence    Fear of current or ex partner: Not on file    Emotionally abused: Not on file    Physically abused: Not on file    Forced sexual activity: Not on file  Other Topics Concern   Not on file  Social History Narrative   Married, 3 children.   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   Former smoker: quit approx around [place].  80 pack-yr hx.   Alcohol-none, used to drink a lot of beer on weekends.  No drugs.   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   Diet: fair    ALLERGIES: Patient has no known allergies.  MEDICATIONS:  Current Outpatient Medications  Medication Sig Dispense Refill   acetaminophen (TYLENOL) 500 MG tablet Take 500 mg by mouth every 6 (six) hours as needed for moderate pain.     allopurinol (ZYLOPRIM) 300 MG tablet Take 1 tablet (300 mg total) by mouth daily. 90 tablet 0   amLODipine (NORVASC) 5 MG tablet Take 1 tablet (5 mg  total) by mouth daily. (Patient taking differently: Take 5 mg by mouth every evening. ) 90 tablet 1   aspirin 81 MG tablet Take 81 mg by mouth daily.       desonide (DESOWEN) 0.05 % cream Apply 1 application topically 3 (three) times a week.   1   diphenhydrAMINE (BENADRYL) 25 MG tablet Take 50 mg by mouth 3 (three) times daily as needed for itching or allergies.      EPINEPHrine 0.3 mg/0.3 mL IJ SOAJ injection Inject 0.3 mg into the muscle as needed for anaphylaxis.      fluticasone (FLONASE) 50 MCG/ACT nasal spray Place 1 spray into both nostrils daily.     gabapentin (NEURONTIN) 300 MG capsule Take 300 mg by mouth 3 (three) times daily.      loratadine (CLARITIN) 10 MG tablet Take 20 mg by mouth daily.      losartan (COZAAR) 50 MG tablet TAKE 1 TABLET BY MOUTH TWICE A DAY (Patient taking differently: Take 50 mg by mouth 2 (two) times daily. ) 60 tablet 11   metoprolol tartrate (LOPRESSOR) 25 MG tablet Take 1 tablet (25 mg total) by mouth 2 (two) times daily. 180 tablet 3   nitroGLYCERIN (NITROSTAT) 0.4 MG SL tablet Place 1 tablet (0.4 mg total) under the tongue every 5 (five) minutes as needed for chest pain. 25 tablet 6   pantoprazole (PROTONIX) 40 MG tablet Take 1 tablet (40 mg total) by mouth daily. 90 tablet 3   PREVIDENT 5000 BOOSTER PLUS 1.1 % PSTE Take 1 application by mouth at bedtime.   1   rosuvastatin (CRESTOR) 20 MG tablet Take 1 tablet (20 mg total) by mouth daily. (Patient taking differently: Take 20 mg by mouth every evening. ) 90 tablet 1   tamsulosin (FLOMAX) 0.4 MG CAPS capsule Take 0.4 mg by mouth.     traMADol (ULTRAM) 50 MG tablet Take 1 tablet (50 mg total) by mouth 3 (three) times daily as needed. for pain 90 tablet 5  sildenafil (VIAGRA) 100 MG tablet Take 1 tablet (100 mg total) by mouth daily as needed for erectile dysfunction. (Patient not taking: Reported on 06/27/2019) 10 tablet 6   No current facility-administered medications for this encounter.      REVIEW OF SYSTEMS:  On review of systems, the patient reports that he is doing well overall. He denies any chest pain, shortness of breath, cough, fevers, chills, night sweats, unintended weight changes. He denies any bowel disturbances, and denies abdominal pain, nausea or vomiting. He denies any new musculoskeletal or joint aches or pains. He reports chronic lumbar spine pain related to DJD and arthritis. He is doing well since his TURBT procedure and currently denies dysuria, hematuria, and any bowel complaints. A complete review of systems is obtained and is otherwise negative.    PHYSICAL EXAM:  Wt Readings from Last 3 Encounters:  06/27/19 215 lb (97.5 kg)  06/23/19 216 lb 12.8 oz (98.3 kg)  06/12/19 216 lb (98 kg)   Temp Readings from Last 3 Encounters:  06/23/19 98.3 F (36.8 C) (Oral)  06/12/19 98.4 F (36.9 C)  06/06/19 98.3 F (36.8 C) (Oral)   BP Readings from Last 3 Encounters:  06/23/19 99/62  06/12/19 119/75  06/06/19 (!) 134/91   Pulse Readings from Last 3 Encounters:  06/23/19 60  06/12/19 67  06/06/19 (!) 53   Pain Assessment Pain Score: 0-No pain(chronic lumbar spine pain. djd. arthritis.)/10  In general this is a well appearing Caucasian male in no acute distress. He's alert and oriented x4 and appropriate throughout the examination. Cardiopulmonary assessment is negative for acute distress and he exhibits normal effort.   KPS = 90  100 - Normal; no complaints; no evidence of disease. 90   - Able to carry on normal activity; minor signs or symptoms of disease. 80   - Normal activity with effort; some signs or symptoms of disease. 74   - Cares for self; unable to carry on normal activity or to do active work. 60   - Requires occasional assistance, but is able to care for most of his personal needs. 50   - Requires considerable assistance and frequent medical care. 23   - Disabled; requires special care and assistance. 64   - Severely disabled;  hospital admission is indicated although death not imminent. 56   - Very sick; hospital admission necessary; active supportive treatment necessary. 10   - Moribund; fatal processes progressing rapidly. 0     - Dead  Karnofsky DA, Abelmann [place], Craver LS and Burchenal [facility] [phone]) The use of the nitrogen mustards in the palliative treatment of carcinoma: with particular reference to bronchogenic carcinoma Cancer 1 634-56  LABORATORY DATA:  Lab Results  Component Value Date   WBC 8.3 06/06/2019   HGB 15.6 06/06/2019   HCT 46.5 06/06/2019   MCV 94.5 06/06/2019   PLT 175 06/06/2019   Lab Results  Component Value Date   NA 143 06/06/2019   K 4.5 06/06/2019   CL 105 06/06/2019   CO2 29 06/06/2019   Lab Results  Component Value Date   ALT 12 03/13/2019   AST 20 03/13/2019   ALKPHOS 47 03/13/2019   BILITOT 0.7 03/13/2019     RADIOGRAPHY: No results found.    IMPRESSION/PLAN:  This visit was conducted via MyChart to spare the patient unnecessary potential exposure in the healthcare setting during the current COVID-19 pandemic.  1. 78 y.o. gentleman with high-grade papillary urothelial carcinoma of the bladder with muscle  invasion. Today, we talked to the patient and family about the findings and workup thus far. We discussed the natural history of bladder cancer and general treatment, highlighting the role of radiotherapy in the management. We discussed the available radiation techniques, and focused on the details of logistics and delivery as it pertains to a 7.5 week course of daily external beam radiotherapy concurrent with systemic chemotherapy.  We discussed and outlined the risks, benefits, short and long-term effects associated with concurrent chemoradiation and compared and contrasted these with cystectomy. The patient was encouraged to ask questions that were answered to his stated satisfaction. He is scheduled to meet with Dr. Tresa Moore on 07/11/2019 to further discuss radical  cystectomy.  At the end of the conversation the patient remains interested in proceeding with his scheduled consultation with Dr. Tresa Moore on 07/11/19 with plans to reach a decision regarding treatment shortly therafter. We look forward to following along in the care of this very nice gentleman and would be more than happy to continue to participate in his care should he elect to proceed with a bladder-sparring curative treatment approach with concurrent chemoradiation.  He knows that he is welcome to call at any time in the interim with any additional questions or concerns.  Given current concerns for patient exposure during the COVID-19 pandemic, this encounter was conducted via video-enabled MyChart visit. The patient has given verbal consent for this type of encounter. The time spent during this encounter was 40 minutes. The attendants for this meeting include Tyler Pita MD, Ashlyn Bruning PA-C, Eugenio Saenz, patient Soroush, and his wife. During the encounter, Tyler Pita MD, Ashlyn Bruning PA-C, and scribe, Wilburn Mylar were located at [facility].  Patient Jeannette and his wife were located at home.    Nicholos Johns, PA-C    Tyler Pita, MD  [facility] Direct Dial: [phone]   Fax: [phone] [email or web address]   Skype   [email or web address]  This document serves as a record of services personally performed by Tyler Pita, MD and Freeman Caldron, PA-C. It was created on their behalf by Wilburn Mylar, a trained medical scribe. The creation of this record is based on the scribe's personal observations and the provider's statements to them. This document has been checked and approved by the attending provider.

## 2019-06-30 DIAGNOSIS — J301 Allergic rhinitis due to pollen: Secondary | ICD-10-CM | POA: Diagnosis not present

## 2019-06-30 DIAGNOSIS — J3089 Other allergic rhinitis: Secondary | ICD-10-CM | POA: Diagnosis not present

## 2019-07-02 ENCOUNTER — Encounter: Payer: Self-pay | Admitting: Family Medicine

## 2019-07-03 ENCOUNTER — Other Ambulatory Visit: Payer: Self-pay | Admitting: Family Medicine

## 2019-07-03 DIAGNOSIS — J301 Allergic rhinitis due to pollen: Secondary | ICD-10-CM | POA: Diagnosis not present

## 2019-07-03 DIAGNOSIS — J3089 Other allergic rhinitis: Secondary | ICD-10-CM | POA: Diagnosis not present

## 2019-07-05 ENCOUNTER — Other Ambulatory Visit: Payer: Self-pay | Admitting: Dermatology

## 2019-07-05 DIAGNOSIS — D229 Melanocytic nevi, unspecified: Secondary | ICD-10-CM | POA: Diagnosis not present

## 2019-07-05 DIAGNOSIS — L821 Other seborrheic keratosis: Secondary | ICD-10-CM | POA: Diagnosis not present

## 2019-07-05 DIAGNOSIS — D485 Neoplasm of uncertain behavior of skin: Secondary | ICD-10-CM | POA: Diagnosis not present

## 2019-07-05 DIAGNOSIS — L570 Actinic keratosis: Secondary | ICD-10-CM | POA: Diagnosis not present

## 2019-07-10 DIAGNOSIS — J301 Allergic rhinitis due to pollen: Secondary | ICD-10-CM | POA: Diagnosis not present

## 2019-07-10 DIAGNOSIS — J3089 Other allergic rhinitis: Secondary | ICD-10-CM | POA: Diagnosis not present

## 2019-07-11 DIAGNOSIS — C672 Malignant neoplasm of lateral wall of bladder: Secondary | ICD-10-CM | POA: Diagnosis not present

## 2019-07-14 ENCOUNTER — Telehealth: Payer: Self-pay | Admitting: Oncology

## 2019-07-14 ENCOUNTER — Telehealth: Payer: Self-pay

## 2019-07-14 ENCOUNTER — Other Ambulatory Visit: Payer: Self-pay | Admitting: Oncology

## 2019-07-14 DIAGNOSIS — C679 Malignant neoplasm of bladder, unspecified: Secondary | ICD-10-CM

## 2019-07-14 MED ORDER — PROCHLORPERAZINE MALEATE 10 MG PO TABS: 10.0000 mg | ORAL_TABLET | Freq: Four times a day (QID) | ORAL | 0 refills | Status: DC | PRN

## 2019-07-14 NOTE — Progress Notes (Signed)

## 2019-07-14 NOTE — Telephone Encounter (Signed)
Patient's wife called and wanted to let Dr. Alen Blew know that patient has decided to go with chemotherapy and surgery. Dr. Alen Blew made aware and stated he sent a message to the scheduling department to get patient scheduled for chemo education and to start chemo in the next 7-10 days. Patient's wife aware to expect a call from the scheduling department and she will call the office back if they have not heard anything from scheduling by Wednesday.

## 2019-07-14 NOTE — Telephone Encounter (Signed)
Spoke with patient re 10/26 education appointment. Awaiting approval for 1st time tx add on week of 10/28. Patient aware.

## 2019-07-17 NOTE — Telephone Encounter (Signed)
Confirmed 1st chemo appointment with patient for 10/28. Also reminded patient of 10/26 patient education class. Patient will get new schedule at 10/26 visit.

## 2019-07-18 DIAGNOSIS — J301 Allergic rhinitis due to pollen: Secondary | ICD-10-CM | POA: Diagnosis not present

## 2019-07-18 DIAGNOSIS — J3089 Other allergic rhinitis: Secondary | ICD-10-CM | POA: Diagnosis not present

## 2019-07-20 ENCOUNTER — Other Ambulatory Visit: Payer: Self-pay | Admitting: Family Medicine

## 2019-07-24 ENCOUNTER — Inpatient Hospital Stay: Payer: Medicare HMO | Attending: Oncology

## 2019-07-24 ENCOUNTER — Other Ambulatory Visit: Payer: Self-pay

## 2019-07-24 DIAGNOSIS — C679 Malignant neoplasm of bladder, unspecified: Secondary | ICD-10-CM | POA: Insufficient documentation

## 2019-07-24 DIAGNOSIS — Z5111 Encounter for antineoplastic chemotherapy: Secondary | ICD-10-CM | POA: Insufficient documentation

## 2019-07-24 DIAGNOSIS — J3089 Other allergic rhinitis: Secondary | ICD-10-CM | POA: Diagnosis not present

## 2019-07-24 DIAGNOSIS — J301 Allergic rhinitis due to pollen: Secondary | ICD-10-CM | POA: Diagnosis not present

## 2019-07-26 ENCOUNTER — Other Ambulatory Visit: Payer: Self-pay

## 2019-07-26 ENCOUNTER — Inpatient Hospital Stay: Payer: Medicare HMO

## 2019-07-26 VITALS — BP 110/72 | HR 46 | Temp 98.1°F | Resp 20

## 2019-07-26 DIAGNOSIS — Z5111 Encounter for antineoplastic chemotherapy: Secondary | ICD-10-CM | POA: Diagnosis not present

## 2019-07-26 DIAGNOSIS — C679 Malignant neoplasm of bladder, unspecified: Secondary | ICD-10-CM

## 2019-07-26 LAB — CMP (CANCER CENTER ONLY)
ALT: 15 U/L (ref 0–44)
AST: 16 U/L (ref 15–41)
Albumin: 4.2 g/dL (ref 3.5–5.0)
Alkaline Phosphatase: 47 U/L (ref 38–126)
Anion gap: 12 (ref 5–15)
BUN: 9 mg/dL (ref 8–23)
CO2: 25 mmol/L (ref 22–32)
Calcium: 9.3 mg/dL (ref 8.9–10.3)
Chloride: 106 mmol/L (ref 98–111)
Creatinine: 1.01 mg/dL (ref 0.61–1.24)
GFR, Est AFR Am: >60 mL/min (ref >60)
GFR, Estimated: >60 mL/min (ref >60)
Glucose, Bld: 96 mg/dL (ref 70–99)
Potassium: 3.8 mmol/L (ref 3.5–5.1)
Sodium: 143 mmol/L (ref 135–145)
Total Bilirubin: 0.7 mg/dL (ref 0.3–1.2)
Total Protein: 6.6 g/dL (ref 6.5–8.1)

## 2019-07-26 LAB — CBC WITH DIFFERENTIAL (CANCER CENTER ONLY)
Abs Immature Granulocytes: 0.01 10*3/uL (ref 0.00–0.07)
Basophils Absolute: 0.1 10*3/uL (ref 0.0–0.1)
Basophils Relative: 1 %
Eosinophils Absolute: 0.3 10*3/uL (ref 0.0–0.5)
Eosinophils Relative: 5 %
HCT: 43.5 % (ref 39.0–52.0)
Hemoglobin: 15.2 g/dL (ref 13.0–17.0)
Immature Granulocytes: 0 %
Lymphocytes Relative: 35 %
Lymphs Abs: 2.5 10*3/uL (ref 0.7–4.0)
MCH: 32.1 pg (ref 26.0–34.0)
MCHC: 34.9 g/dL (ref 30.0–36.0)
MCV: 92 fL (ref 80.0–100.0)
Monocytes Absolute: 0.5 10*3/uL (ref 0.1–1.0)
Monocytes Relative: 8 %
Neutro Abs: 3.8 10*3/uL (ref 1.7–7.7)
Neutrophils Relative %: 51 %
Platelet Count: 164 10*3/uL (ref 150–400)
RBC: 4.73 MIL/uL (ref 4.22–5.81)
RDW: 12.6 % (ref 11.5–15.5)
WBC Count: 7.2 10*3/uL (ref 4.0–10.5)
nRBC: 0 % (ref 0.0–0.2)

## 2019-07-26 LAB — MAGNESIUM: Magnesium: 2 mg/dL (ref 1.7–2.4)

## 2019-07-26 MED ORDER — SODIUM CHLORIDE 0.9 % IV SOLN
Freq: Once | INTRAVENOUS | Status: AC
  Administered 2019-07-26: 11:00:00 via INTRAVENOUS
  Filled 2019-07-26: qty 5

## 2019-07-26 MED ORDER — PALONOSETRON HCL INJECTION 0.25 MG/5ML
INTRAVENOUS | Status: AC
  Filled 2019-07-26: qty 5

## 2019-07-26 MED ORDER — SODIUM CHLORIDE 0.9 % IV SOLN
70.0000 mg/m2 | Freq: Once | INTRAVENOUS | Status: AC
  Administered 2019-07-26: 158 mg via INTRAVENOUS
  Filled 2019-07-26: qty 158

## 2019-07-26 MED ORDER — SODIUM CHLORIDE 0.9 % IV SOLN
Freq: Once | INTRAVENOUS | Status: AC
  Administered 2019-07-26: 12:00:00 via INTRAVENOUS
  Filled 2019-07-26: qty 250

## 2019-07-26 MED ORDER — POTASSIUM CHLORIDE 2 MEQ/ML IV SOLN
Freq: Once | INTRAVENOUS | Status: AC
  Administered 2019-07-26: 09:00:00 via INTRAVENOUS
  Filled 2019-07-26: qty 10

## 2019-07-26 MED ORDER — HEPARIN SOD (PORK) LOCK FLUSH 100 UNIT/ML IV SOLN
500.0000 [IU] | Freq: Once | INTRAVENOUS | Status: DC | PRN
  Filled 2019-07-26: qty 5

## 2019-07-26 MED ORDER — SODIUM CHLORIDE 0.9 % IV SOLN
1000.0000 mg/m2 | Freq: Once | INTRAVENOUS | Status: AC
  Administered 2019-07-26: 2242 mg via INTRAVENOUS
  Filled 2019-07-26: qty 58.97

## 2019-07-26 MED ORDER — SODIUM CHLORIDE 0.9% FLUSH
10.0000 mL | INTRAVENOUS | Status: DC | PRN
  Filled 2019-07-26: qty 10

## 2019-07-26 MED ORDER — PALONOSETRON HCL INJECTION 0.25 MG/5ML
0.2500 mg | Freq: Once | INTRAVENOUS | Status: AC
  Administered 2019-07-26: 0.25 mg via INTRAVENOUS

## 2019-07-26 MED ORDER — SODIUM CHLORIDE 0.9 % IV SOLN
Freq: Once | INTRAVENOUS | Status: AC
  Administered 2019-07-26: 09:00:00 via INTRAVENOUS
  Filled 2019-07-26: qty 250

## 2019-07-26 NOTE — Patient Instructions (Signed)
[facility] Discharge Instructions for Patients Receiving Chemotherapy  Today you received the following chemotherapy agents: Gemzar, Cisplatin  To help prevent nausea and vomiting after your treatment, we encourage you to take your nausea medication as directed.   If you develop nausea and vomiting that is not controlled by your nausea medication, call the clinic.   BELOW ARE SYMPTOMS THAT SHOULD BE REPORTED IMMEDIATELY:  *FEVER GREATER THAN 100.5 F  *CHILLS WITH OR WITHOUT FEVER  NAUSEA AND VOMITING THAT IS NOT CONTROLLED WITH YOUR NAUSEA MEDICATION  *UNUSUAL SHORTNESS OF BREATH  *UNUSUAL BRUISING OR BLEEDING  TENDERNESS IN MOUTH AND THROAT WITH OR WITHOUT PRESENCE OF ULCERS  *URINARY PROBLEMS  *BOWEL PROBLEMS  UNUSUAL RASH Items with * indicate a potential emergency and should be followed up as soon as possible.  Feel free to call the clinic should you have any questions or concerns. The clinic phone number is [phone].  Please show the [facility] at check-in to the Emergency Department and triage nurse.  Gemcitabine injection What is this medicine? GEMCITABINE (jem SYE ta been) is a chemotherapy drug. This medicine is used to treat many types of cancer like breast cancer, lung cancer, pancreatic cancer, and ovarian cancer. This medicine may be used for other purposes; ask your health care provider or pharmacist if you have questions. COMMON BRAND NAME(S): Gemzar, Infugem What should I tell my health care provider before I take this medicine? They need to know if you have any of these conditions:  blood disorders  infection  kidney disease  liver disease  lung or breathing disease, like asthma  recent or ongoing radiation therapy  an unusual or allergic reaction to gemcitabine, other chemotherapy, other medicines, foods, dyes, or preservatives  pregnant or trying to get pregnant  breast-feeding How should I use this  medicine? This drug is given as an infusion into a vein. It is administered in a hospital or clinic by a specially trained health care professional. Talk to your pediatrician regarding the use of this medicine in children. Special care may be needed. Overdosage: If you think you have taken too much of this medicine contact a poison control center or emergency room at once. NOTE: This medicine is only for you. Do not share this medicine with others. What if I miss a dose? It is important not to miss your dose. Call your doctor or health care professional if you are unable to keep an appointment. What may interact with this medicine?  medicines to increase blood counts like filgrastim, pegfilgrastim, sargramostim  some other chemotherapy drugs like cisplatin  vaccines Talk to your doctor or health care professional before taking any of these medicines:  acetaminophen  aspirin  ibuprofen  ketoprofen  naproxen This list may not describe all possible interactions. Give your health care provider a list of all the medicines, herbs, non-prescription drugs, or dietary supplements you use. Also tell them if you smoke, drink alcohol, or use illegal drugs. Some items may interact with your medicine. What should I watch for while using this medicine? Visit your doctor for checks on your progress. This drug may make you feel generally unwell. This is not uncommon, as chemotherapy can affect healthy cells as well as cancer cells. Report any side effects. Continue your course of treatment even though you feel ill unless your doctor tells you to stop. In some cases, you may be given additional medicines to help with side effects. Follow all directions for their use.  use. °Call your doctor or health care professional for advice if you get a fever, chills or sore throat, or other symptoms of a cold or flu. Do not treat yourself. This drug decreases your body's ability to fight infections. Try to avoid being  around people who are sick. °This medicine may increase your risk to bruise or bleed. Call your doctor or health care professional if you notice any unusual bleeding. °Be careful brushing and flossing your teeth or using a toothpick because you may get an infection or bleed more easily. If you have any dental work done, tell your dentist you are receiving this medicine. °Avoid taking products that contain aspirin, acetaminophen, ibuprofen, naproxen, or ketoprofen unless instructed by your doctor. These medicines may hide a fever. °Do not become pregnant while taking this medicine or for 6 months after stopping it. Women should inform their doctor if they wish to become pregnant or think they might be pregnant. Men should not father a child while taking this medicine and for 3 months after stopping it. There is a potential for serious side effects to an unborn child. Talk to your health care professional or pharmacist for more information. Do not breast-feed an infant while taking this medicine or for at least 1 week after stopping it. °Men should inform their doctors if they wish to father a child. This medicine may lower sperm counts. Talk with your doctor or health care professional if you are concerned about your fertility. °What side effects may I notice from receiving this medicine? °Side effects that you should report to your doctor or health care professional as soon as possible: °· allergic reactions like skin rash, itching or hives, swelling of the face, lips, or tongue °· breathing problems °· pain, redness, or irritation at site where injected °· signs and symptoms of a dangerous change in heartbeat or heart rhythm like chest pain; dizziness; fast or irregular heartbeat; palpitations; feeling faint or lightheaded, falls; breathing problems °· signs of decreased platelets or bleeding - bruising, pinpoint red spots on the skin, black, tarry stools, blood in the urine °· signs of decreased red blood cells -  unusually weak or tired, feeling faint or lightheaded, falls °· signs of infection - fever or chills, cough, sore throat, pain or difficulty passing urine °· signs and symptoms of kidney injury like trouble passing urine or change in the amount of urine °· signs and symptoms of liver injury like dark yellow or brown urine; general ill feeling or flu-like symptoms; light-colored stools; loss of appetite; nausea; right upper belly pain; unusually weak or tired; yellowing of the eyes or skin °· swelling of ankles, feet, hands °Side effects that usually do not require medical attention (report to your doctor or health care professional if they continue or are bothersome): °· constipation °· diarrhea °· hair loss °· loss of appetite °· nausea °· rash °· vomiting °This list may not describe all possible side effects. Call your doctor for medical advice about side effects. You may report side effects to FDA at 1-800-FDA-1088. °Where should I keep my medicine? °This drug is given in a hospital or clinic and will not be stored at home. °NOTE: This sheet is a summary. It may not cover all possible information. If you have questions about this medicine, talk to your doctor, pharmacist, or health care provider. °© 2020 Elsevier/Gold Standard (2017-12-08 18:06:11) ° °Cisplatin injection °What is this medicine? °CISPLATIN (SIS pla tin) is a chemotherapy drug. It targets fast dividing   like cancer cells, and causes these cells to die. This medicine is used to treat many types of cancer like bladder, ovarian, and testicular cancers. This medicine may be used for other purposes; ask your health care provider or pharmacist if you have questions. COMMON BRAND NAME(S): Platinol, Platinol -AQ What should I tell my health care provider before I take this medicine? They need to know if you have any of these conditions:  blood disorders  hearing problems  kidney disease  recent or ongoing radiation therapy  an unusual  or allergic reaction to cisplatin, carboplatin, other chemotherapy, other medicines, foods, dyes, or preservatives  pregnant or trying to get pregnant  breast-feeding How should I use this medicine? This drug is given as an infusion into a vein. It is administered in a hospital or clinic by a specially trained health care professional. Talk to your pediatrician regarding the use of this medicine in children. Special care may be needed. Overdosage: If you think you have taken too much of this medicine contact a poison control center or emergency room at once. NOTE: This medicine is only for you. Do not share this medicine with others. What if I miss a dose? It is important not to miss a dose. Call your doctor or health care professional if you are unable to keep an appointment. What may interact with this medicine?  dofetilide  foscarnet  medicines for seizures  medicines to increase blood counts like filgrastim, pegfilgrastim, sargramostim  probenecid  pyridoxine used with altretamine  rituximab  some antibiotics like amikacin, gentamicin, neomycin, polymyxin B, streptomycin, tobramycin  sulfinpyrazone  vaccines  zalcitabine Talk to your doctor or health care professional before taking any of these medicines:  acetaminophen  aspirin  ibuprofen  ketoprofen  naproxen This list may not describe all possible interactions. Give your health care provider a list of all the medicines, herbs, non-prescription drugs, or dietary supplements you use. Also tell them if you smoke, drink alcohol, or use illegal drugs. Some items may interact with your medicine. What should I watch for while using this medicine? Your condition will be monitored carefully while you are receiving this medicine. You will need important blood work done while you are taking this medicine. This drug may make you feel generally unwell. This is not uncommon, as chemotherapy can affect healthy cells as well  as cancer cells. Report any side effects. Continue your course of treatment even though you feel ill unless your doctor tells you to stop. In some cases, you may be given additional medicines to help with side effects. Follow all directions for their use. Call your doctor or health care professional for advice if you get a fever, chills or sore throat, or other symptoms of a cold or flu. Do not treat yourself. This drug decreases your body's ability to fight infections. Try to avoid being around people who are sick. This medicine may increase your risk to bruise or bleed. Call your doctor or health care professional if you notice any unusual bleeding. Be careful brushing and flossing your teeth or using a toothpick because you may get an infection or bleed more easily. If you have any dental work done, tell your dentist you are receiving this medicine. Avoid taking products that contain aspirin, acetaminophen, ibuprofen, naproxen, or ketoprofen unless instructed by your doctor. These medicines may hide a fever. Do not become pregnant while taking this medicine. Women should inform their doctor if they wish to become pregnant or think they might  be pregnant. There is a potential for serious side effects to an unborn child. Talk to your health care professional or pharmacist for more information. Do not breast-feed an infant while taking this medicine. Drink fluids as directed while you are taking this medicine. This will help protect your kidneys. Call your doctor or health care professional if you get diarrhea. Do not treat yourself. What side effects may I notice from receiving this medicine? Side effects that you should report to your doctor or health care professional as soon as possible:  allergic reactions like skin rash, itching or hives, swelling of the face, lips, or tongue  signs of infection - fever or chills, cough, sore throat, pain or difficulty passing urine  signs of decreased  platelets or bleeding - bruising, pinpoint red spots on the skin, black, tarry stools, nosebleeds  signs of decreased red blood cells - unusually weak or tired, fainting spells, lightheadedness  breathing problems  changes in hearing  gout pain  low blood counts - This drug may decrease the number of white blood cells, red blood cells and platelets. You may be at increased risk for infections and bleeding.  nausea and vomiting  pain, swelling, redness or irritation at the injection site  pain, tingling, numbness in the hands or feet  problems with balance, movement  trouble passing urine or change in the amount of urine Side effects that usually do not require medical attention (report to your doctor or health care professional if they continue or are bothersome):  changes in vision  loss of appetite  metallic taste in the mouth or changes in taste This list may not describe all possible side effects. Call your doctor for medical advice about side effects. You may report side effects to FDA at 1-800-FDA-1088. Where should I keep my medicine? This drug is given in a hospital or clinic and will not be stored at home. NOTE: This sheet is a summary. It may not cover all possible information. If you have questions about this medicine, talk to your doctor, pharmacist, or health care provider.  2020 Elsevier/Gold Standard (2007-12-20 14:40:54)

## 2019-08-01 DIAGNOSIS — J301 Allergic rhinitis due to pollen: Secondary | ICD-10-CM | POA: Diagnosis not present

## 2019-08-01 DIAGNOSIS — J3089 Other allergic rhinitis: Secondary | ICD-10-CM | POA: Diagnosis not present

## 2019-08-02 ENCOUNTER — Other Ambulatory Visit: Payer: Self-pay

## 2019-08-02 ENCOUNTER — Inpatient Hospital Stay: Payer: Medicare HMO

## 2019-08-02 ENCOUNTER — Inpatient Hospital Stay: Payer: Medicare HMO | Attending: Oncology

## 2019-08-02 VITALS — BP 134/82 | HR 61 | Temp 98.3°F | Resp 18

## 2019-08-02 DIAGNOSIS — Z7982 Long term (current) use of aspirin: Secondary | ICD-10-CM | POA: Insufficient documentation

## 2019-08-02 DIAGNOSIS — C679 Malignant neoplasm of bladder, unspecified: Secondary | ICD-10-CM | POA: Diagnosis present

## 2019-08-02 DIAGNOSIS — Z5111 Encounter for antineoplastic chemotherapy: Secondary | ICD-10-CM | POA: Diagnosis not present

## 2019-08-02 DIAGNOSIS — Z79899 Other long term (current) drug therapy: Secondary | ICD-10-CM | POA: Diagnosis not present

## 2019-08-02 DIAGNOSIS — D701 Agranulocytosis secondary to cancer chemotherapy: Secondary | ICD-10-CM | POA: Insufficient documentation

## 2019-08-02 LAB — CBC WITH DIFFERENTIAL (CANCER CENTER ONLY)
Abs Immature Granulocytes: 0 10*3/uL (ref 0.00–0.07)
Basophils Absolute: 0 10*3/uL (ref 0.0–0.1)
Basophils Relative: 1 %
Eosinophils Absolute: 0.1 10*3/uL (ref 0.0–0.5)
Eosinophils Relative: 5 %
HCT: 41.3 % (ref 39.0–52.0)
Hemoglobin: 14.3 g/dL (ref 13.0–17.0)
Immature Granulocytes: 0 %
Lymphocytes Relative: 55 %
Lymphs Abs: 1.8 10*3/uL (ref 0.7–4.0)
MCH: 32.1 pg (ref 26.0–34.0)
MCHC: 34.6 g/dL (ref 30.0–36.0)
MCV: 92.6 fL (ref 80.0–100.0)
Monocytes Absolute: 0.1 10*3/uL (ref 0.1–1.0)
Monocytes Relative: 4 %
Neutro Abs: 1.1 10*3/uL — ABNORMAL LOW (ref 1.7–7.7)
Neutrophils Relative %: 35 %
Platelet Count: 107 10*3/uL — ABNORMAL LOW (ref 150–400)
RBC: 4.46 MIL/uL (ref 4.22–5.81)
RDW: 12.3 % (ref 11.5–15.5)
WBC Count: 3.1 10*3/uL — ABNORMAL LOW (ref 4.0–10.5)
nRBC: 0 % (ref 0.0–0.2)

## 2019-08-02 LAB — CMP (CANCER CENTER ONLY)
ALT: 23 U/L (ref 0–44)
AST: 23 U/L (ref 15–41)
Albumin: 4.2 g/dL (ref 3.5–5.0)
Alkaline Phosphatase: 48 U/L (ref 38–126)
Anion gap: 11 (ref 5–15)
BUN: 18 mg/dL (ref 8–23)
CO2: 25 mmol/L (ref 22–32)
Calcium: 9 mg/dL (ref 8.9–10.3)
Chloride: 100 mmol/L (ref 98–111)
Creatinine: 1.2 mg/dL (ref 0.61–1.24)
GFR, Est AFR Am: >60 mL/min (ref >60)
GFR, Estimated: 58 mL/min — ABNORMAL LOW (ref >60)
Glucose, Bld: 97 mg/dL (ref 70–99)
Potassium: 4 mmol/L (ref 3.5–5.1)
Sodium: 136 mmol/L (ref 135–145)
Total Bilirubin: 0.6 mg/dL (ref 0.3–1.2)
Total Protein: 6.6 g/dL (ref 6.5–8.1)

## 2019-08-02 LAB — MAGNESIUM: Magnesium: 2.1 mg/dL (ref 1.7–2.4)

## 2019-08-02 MED ORDER — PROCHLORPERAZINE MALEATE 10 MG PO TABS
10.0000 mg | ORAL_TABLET | Freq: Once | ORAL | Status: AC
  Administered 2019-08-02: 10 mg via ORAL

## 2019-08-02 MED ORDER — SODIUM CHLORIDE 0.9 % IV SOLN
1000.0000 mg/m2 | Freq: Once | INTRAVENOUS | Status: AC
  Administered 2019-08-02: 2242 mg via INTRAVENOUS
  Filled 2019-08-02: qty 58.97

## 2019-08-02 MED ORDER — PROCHLORPERAZINE MALEATE 10 MG PO TABS
ORAL_TABLET | ORAL | Status: AC
  Filled 2019-08-02: qty 1

## 2019-08-02 MED ORDER — SODIUM CHLORIDE 0.9 % IV SOLN
Freq: Once | INTRAVENOUS | Status: AC
  Administered 2019-08-02: 14:00:00 via INTRAVENOUS
  Filled 2019-08-02: qty 250

## 2019-08-02 NOTE — Progress Notes (Signed)
Okay to treat today with ANC 1.1, per Dr. Alen Blew.

## 2019-08-02 NOTE — Patient Instructions (Signed)
[facility] Discharge Instructions for Patients Receiving Chemotherapy  Today you received the following chemotherapy agents :  Gemzar  To help prevent nausea and vomiting after your treatment, we encourage you to take your nausea medication.   If you develop nausea and vomiting that is not controlled by your nausea medication, call the clinic.   BELOW ARE SYMPTOMS THAT SHOULD BE REPORTED IMMEDIATELY:  *FEVER GREATER THAN 100.5 F  *CHILLS WITH OR WITHOUT FEVER  NAUSEA AND VOMITING THAT IS NOT CONTROLLED WITH YOUR NAUSEA MEDICATION  *UNUSUAL SHORTNESS OF BREATH  *UNUSUAL BRUISING OR BLEEDING  TENDERNESS IN MOUTH AND THROAT WITH OR WITHOUT PRESENCE OF ULCERS  *URINARY PROBLEMS  *BOWEL PROBLEMS  UNUSUAL RASH Items with * indicate a potential emergency and should be followed up as soon as possible.  Feel free to call the clinic should you have any questions or concerns. The clinic phone number is [phone].  Please show the CHEMO ALERT CARD at check-in to the Emergency Department and triage nurse.   

## 2019-08-09 DIAGNOSIS — J301 Allergic rhinitis due to pollen: Secondary | ICD-10-CM | POA: Diagnosis not present

## 2019-08-09 DIAGNOSIS — J3089 Other allergic rhinitis: Secondary | ICD-10-CM | POA: Diagnosis not present

## 2019-08-15 ENCOUNTER — Encounter: Payer: Self-pay | Admitting: Oncology

## 2019-08-15 DIAGNOSIS — J301 Allergic rhinitis due to pollen: Secondary | ICD-10-CM | POA: Diagnosis not present

## 2019-08-15 DIAGNOSIS — J3089 Other allergic rhinitis: Secondary | ICD-10-CM | POA: Diagnosis not present

## 2019-08-15 NOTE — Progress Notes (Signed)
Called pt to introduce myself as his Arboriculturist.  Unfortunately there aren't any foundations offering copay assistance for his Dx and the type of ins he has.  I offered the [place], went over what it covers and gave him the income requirement.  He told me his household income and it does exceed the requirement so he doesn't qualify for the grant at this time.  I requested for the registration staff to give him my card for any questions or concerns he may have in the future.

## 2019-08-16 ENCOUNTER — Other Ambulatory Visit: Payer: Self-pay

## 2019-08-16 ENCOUNTER — Telehealth: Payer: Self-pay

## 2019-08-16 ENCOUNTER — Inpatient Hospital Stay: Payer: Medicare HMO | Admitting: Oncology

## 2019-08-16 ENCOUNTER — Encounter: Payer: Self-pay | Admitting: Family Medicine

## 2019-08-16 ENCOUNTER — Inpatient Hospital Stay: Payer: Medicare HMO

## 2019-08-16 VITALS — BP 103/73 | HR 60 | Temp 97.1°F | Resp 18 | Wt 213.6 lb

## 2019-08-16 DIAGNOSIS — C679 Malignant neoplasm of bladder, unspecified: Secondary | ICD-10-CM

## 2019-08-16 DIAGNOSIS — Z5111 Encounter for antineoplastic chemotherapy: Secondary | ICD-10-CM | POA: Diagnosis not present

## 2019-08-16 DIAGNOSIS — Z79899 Other long term (current) drug therapy: Secondary | ICD-10-CM | POA: Diagnosis not present

## 2019-08-16 DIAGNOSIS — D701 Agranulocytosis secondary to cancer chemotherapy: Secondary | ICD-10-CM | POA: Diagnosis not present

## 2019-08-16 DIAGNOSIS — Z7982 Long term (current) use of aspirin: Secondary | ICD-10-CM | POA: Diagnosis not present

## 2019-08-16 LAB — CBC WITH DIFFERENTIAL (CANCER CENTER ONLY)
Abs Immature Granulocytes: 0.01 10*3/uL (ref 0.00–0.07)
Basophils Absolute: 0 10*3/uL (ref 0.0–0.1)
Basophils Relative: 0 %
Eosinophils Absolute: 0.1 10*3/uL (ref 0.0–0.5)
Eosinophils Relative: 4 %
HCT: 35.4 % — ABNORMAL LOW (ref 39.0–52.0)
Hemoglobin: 12.4 g/dL — ABNORMAL LOW (ref 13.0–17.0)
Immature Granulocytes: 0 %
Lymphocytes Relative: 62 %
Lymphs Abs: 1.6 10*3/uL (ref 0.7–4.0)
MCH: 32 pg (ref 26.0–34.0)
MCHC: 35 g/dL (ref 30.0–36.0)
MCV: 91.2 fL (ref 80.0–100.0)
Monocytes Absolute: 0.4 10*3/uL (ref 0.1–1.0)
Monocytes Relative: 14 %
Neutro Abs: 0.5 10*3/uL — ABNORMAL LOW (ref 1.7–7.7)
Neutrophils Relative %: 20 %
Platelet Count: 437 10*3/uL — ABNORMAL HIGH (ref 150–400)
RBC: 3.88 MIL/uL — ABNORMAL LOW (ref 4.22–5.81)
RDW: 12.5 % (ref 11.5–15.5)
WBC Count: 2.6 10*3/uL — ABNORMAL LOW (ref 4.0–10.5)
nRBC: 0 % (ref 0.0–0.2)

## 2019-08-16 LAB — CMP (CANCER CENTER ONLY)
ALT: 16 U/L (ref 0–44)
AST: 15 U/L (ref 15–41)
Albumin: 4 g/dL (ref 3.5–5.0)
Alkaline Phosphatase: 49 U/L (ref 38–126)
Anion gap: 9 (ref 5–15)
BUN: 10 mg/dL (ref 8–23)
CO2: 25 mmol/L (ref 22–32)
Calcium: 8.7 mg/dL — ABNORMAL LOW (ref 8.9–10.3)
Chloride: 107 mmol/L (ref 98–111)
Creatinine: 1 mg/dL (ref 0.61–1.24)
GFR, Est AFR Am: >60 mL/min (ref >60)
GFR, Estimated: >60 mL/min (ref >60)
Glucose, Bld: 97 mg/dL (ref 70–99)
Potassium: 3.8 mmol/L (ref 3.5–5.1)
Sodium: 141 mmol/L (ref 135–145)
Total Bilirubin: 0.4 mg/dL (ref 0.3–1.2)
Total Protein: 6.3 g/dL — ABNORMAL LOW (ref 6.5–8.1)

## 2019-08-16 LAB — MAGNESIUM: Magnesium: 2.1 mg/dL (ref 1.7–2.4)

## 2019-08-16 NOTE — Progress Notes (Signed)
Hematology and Oncology Follow Up Visit  Brandon Haynes [id number] Jun 20, 1941 78 y.o. 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  He was found to have T2N0 disease.   Prior Therapy:  He is status post TURBT on 06/12/2019 which showed high grade invasive urothelial carcinoma invading into the muscularis propria.  Current therapy:  He is receiving neoadjuvant chemotherapy utilizing cisplatin and gemcitabine.  Day 1 of cycle 1 given on 07/26/2019.  He is here for day 1 of cycle 2.  Interim History: [name] presents today for a follow-up visit.  Since the last visit, he opted to proceed with a radical cystectomy as a definitive modality instead of radiation.  He is currently receiving neoadjuvant chemotherapy Elevated the first cycle without any complaints.  He denies any nausea, vomiting or shortness of breath.  Denies any excessive fatigue or tiredness.   Patient denied any alteration mental status, neuropathy, confusion or dizziness.  Denies any headaches or lethargy.  Denies any night sweats, weight loss or changes in appetite.  Denied orthopnea, dyspnea on exertion or chest discomfort.  Denies shortness of breath, difficulty breathing hemoptysis or cough.  Denies any abdominal distention, nausea, early satiety or dyspepsia.  Denies any hematuria, frequency, dysuria or nocturia.  Denies any skin irritation, dryness or rash.  Denies any ecchymosis or petechiae.  Denies any lymphadenopathy or clotting.  Denies any heat or cold intolerance.  Denies any anxiety or depression.  Remaining review of system is negative.       Medications: I have reviewed the patient's current medications.  Current Outpatient Medications  Medication Sig Dispense Refill  . acetaminophen (TYLENOL) 500 MG tablet Take 500 mg by mouth every 6 (six) hours as needed for moderate pain.     Marland Kitchen allopurinol (ZYLOPRIM) 300 MG tablet Take 1 tablet (300 mg total) by mouth daily. 90 tablet 0  . amLODipine (NORVASC) 5 MG tablet Take 1 tablet (5 mg total) by mouth daily. (Patient taking differently: Take 5 mg by mouth every evening. ) 90 tablet 1  . aspirin 81 MG tablet Take 81 mg by mouth daily.      Marland Kitchen desonide (DESOWEN) 0.05 % cream Apply 1 application topically 3 (three) times a week.   1  . diphenhydrAMINE (BENADRYL) 25 MG tablet Take 50 mg by mouth 3 (three) times daily as needed for itching or allergies.     Marland Kitchen EPINEPHrine 0.3 mg/0.3 mL IJ SOAJ injection Inject 0.3 mg into the muscle as needed for anaphylaxis.     . fluticasone (FLONASE) 50 MCG/ACT nasal spray Place 1 spray into both nostrils daily.    Marland Kitchen gabapentin (NEURONTIN) 300 MG capsule Take 300 mg by mouth 3 (three) times daily.     Marland Kitchen loratadine (CLARITIN) 10 MG tablet Take 20 mg by mouth daily.     Marland Kitchen losartan (COZAAR) 50 MG tablet TAKE 1 TABLET BY MOUTH TWICE A DAY (Patient taking differently: Take 50 mg by mouth 2 (two) times daily. ) 60 tablet 11  . metoprolol tartrate (LOPRESSOR) 25 MG tablet Take 1 tablet (25 mg total) by mouth 2 (two) times daily. 180 tablet 3  . nitroGLYCERIN (NITROSTAT) 0.4 MG SL tablet Place 1 tablet (0.4 mg total) under the tongue every 5 (five) minutes as needed for chest pain. 25 tablet 6  . pantoprazole (PROTONIX) 40 MG tablet Take 1 tablet (40 mg total) by  mouth daily. 90 tablet 3  . PREVIDENT 5000 BOOSTER PLUS 1.1 % PSTE Take 1 application by mouth at bedtime.   1  . prochlorperazine (COMPAZINE) 10 MG tablet Take 1 tablet (10 mg total) by mouth every 6 (six) hours as needed for nausea or vomiting. 30 tablet 0  . rosuvastatin (CRESTOR) 20 MG tablet TAKE 1 TABLET BY MOUTH EVERY DAY 90 tablet 1  . sildenafil (VIAGRA) 100 MG tablet Take 1 tablet (100 mg total) by mouth daily as needed for erectile dysfunction. (Patient not taking: Reported on 06/27/2019) 10 tablet 6  . tamsulosin (FLOMAX) 0.4 MG CAPS  capsule Take 0.4 mg by mouth.    . traMADol (ULTRAM) 50 MG tablet Take 1 tablet (50 mg total) by mouth 3 (three) times daily as needed. for pain 90 tablet 5   No current facility-administered medications for this visit.      Allergies: No Known Allergies  Past Medical History, Surgical history, Social history, and Family History were reviewed and updated.    Physical Exam: Blood pressure 103/73, pulse 60, temperature (!) 97.1 F (36.2 C), temperature source Temporal, resp. rate 18, weight 213 lb 9.6 oz (96.9 kg), SpO2 100 %.   ECOG: 0   General appearance: Comfortable appearing without any discomfort Head: Normocephalic without any trauma Oropharynx: Mucous membranes are moist and pink without any thrush or ulcers. Eyes: Pupils are equal and round reactive to light. Lymph nodes: No cervical, supraclavicular, inguinal or axillary lymphadenopathy.   Heart:regular rate and rhythm.  S1 and S2 without leg edema. Lung: Clear without any rhonchi or wheezes.  No dullness to percussion. Abdomin: Soft, nontender, nondistended with good bowel sounds.  No hepatosplenomegaly. Musculoskeletal: No joint deformity or effusion.  Full range of motion noted. Neurological: No deficits noted on motor, sensory and deep tendon reflex exam. Skin: No petechial rash or dryness.  Appeared moist.     Lab Results: Lab Results  Component Value Date   WBC 3.1 (L) 08/02/2019   HGB 14.3 08/02/2019   HCT 41.3 08/02/2019   MCV 92.6 08/02/2019   PLT 107 (L) 08/02/2019     Chemistry      Component Value Date/Time   NA 136 08/02/2019 1310   NA 143 03/13/2019 1141   K 4.0 08/02/2019 1310   CL 100 08/02/2019 1310   CO2 25 08/02/2019 1310   BUN 18 08/02/2019 1310   BUN 11 03/13/2019 1141   CREATININE 1.20 08/02/2019 1310   CREATININE 1.03 05/04/2019 1633      Component Value Date/Time   CALCIUM 9.0 08/02/2019 1310   ALKPHOS 48 08/02/2019 1310   AST 23 08/02/2019 1310   ALT 23 08/02/2019 1310    BILITOT 0.6 08/02/2019 1310       Impression and Plan:  78 year old man with:  1.    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    He is currently receiving neoadjuvant chemotherapy after opting to proceed with radical cystectomy.  Risks and benefits of continuing this chemotherapy and potential long-term complications were reviewed.  He is complication include nausea, vomiting, myelosuppression and renal insufficiency.  He is agreeable to proceed and the plan is to complete 4 cycles of therapy.  Day 1 of cycle 2 chemotherapy will be pushed by 1 week to start on August 23, 2019.  I will make adjustment to cisplatin dosing given his neutropenia.   2.  IV access: Peripheral veins continues to be in use and will consider  Port-A-Cath if issues arise.  3.  Antiemetics: No nausea or vomiting reported at this time.  Compazine is available to him.  4.  Neutropenia: Related to chemotherapy and will make adjustment to both cisplatin and gemcitabine dosing.  5.  Renal function surveillance: His creatinine clearance remains normal and no issues reported with cisplatin.  6.  Follow-up: On August 23, 2019 for start of cycle 2 of therapy.  Day 8 of cycle 2 will be on December 2.  The start of cycle 3 will be tentatively on December 16.  25 minutes was spent with the patient face-to-face today.  More than 50% of time was dedicated to reviewing his laboratory data, disease status update and complications related therapy.     Zola Button, MD 11/18/20208:36 AM

## 2019-08-17 ENCOUNTER — Telehealth: Payer: Self-pay | Admitting: Oncology

## 2019-08-17 NOTE — Telephone Encounter (Signed)
Scheduled appt 11/18 los.  Sent treatment for 12/16 as an add-on

## 2019-08-19 ENCOUNTER — Other Ambulatory Visit: Payer: Self-pay | Admitting: Family Medicine

## 2019-08-22 DIAGNOSIS — J3089 Other allergic rhinitis: Secondary | ICD-10-CM | POA: Diagnosis not present

## 2019-08-22 DIAGNOSIS — J301 Allergic rhinitis due to pollen: Secondary | ICD-10-CM | POA: Diagnosis not present

## 2019-08-23 ENCOUNTER — Inpatient Hospital Stay: Payer: Medicare HMO

## 2019-08-23 ENCOUNTER — Other Ambulatory Visit: Payer: Self-pay | Admitting: Oncology

## 2019-08-23 ENCOUNTER — Other Ambulatory Visit: Payer: Self-pay

## 2019-08-23 VITALS — BP 107/65 | HR 54 | Temp 97.7°F | Resp 16

## 2019-08-23 DIAGNOSIS — C679 Malignant neoplasm of bladder, unspecified: Secondary | ICD-10-CM

## 2019-08-23 DIAGNOSIS — D701 Agranulocytosis secondary to cancer chemotherapy: Secondary | ICD-10-CM | POA: Diagnosis not present

## 2019-08-23 DIAGNOSIS — Z79899 Other long term (current) drug therapy: Secondary | ICD-10-CM | POA: Diagnosis not present

## 2019-08-23 DIAGNOSIS — Z5111 Encounter for antineoplastic chemotherapy: Secondary | ICD-10-CM | POA: Diagnosis not present

## 2019-08-23 DIAGNOSIS — Z7982 Long term (current) use of aspirin: Secondary | ICD-10-CM | POA: Diagnosis not present

## 2019-08-23 LAB — CMP (CANCER CENTER ONLY)
ALT: 14 U/L (ref 0–44)
AST: 16 U/L (ref 15–41)
Albumin: 4 g/dL (ref 3.5–5.0)
Alkaline Phosphatase: 50 U/L (ref 38–126)
Anion gap: 11 (ref 5–15)
BUN: 13 mg/dL (ref 8–23)
CO2: 24 mmol/L (ref 22–32)
Calcium: 8.9 mg/dL (ref 8.9–10.3)
Chloride: 107 mmol/L (ref 98–111)
Creatinine: 1.05 mg/dL (ref 0.61–1.24)
GFR, Est AFR Am: >60 mL/min (ref >60)
GFR, Estimated: >60 mL/min (ref >60)
Glucose, Bld: 109 mg/dL — ABNORMAL HIGH (ref 70–99)
Potassium: 4.1 mmol/L (ref 3.5–5.1)
Sodium: 142 mmol/L (ref 135–145)
Total Bilirubin: 0.4 mg/dL (ref 0.3–1.2)
Total Protein: 6.3 g/dL — ABNORMAL LOW (ref 6.5–8.1)

## 2019-08-23 LAB — CBC WITH DIFFERENTIAL (CANCER CENTER ONLY)
Abs Immature Granulocytes: 0.06 10*3/uL (ref 0.00–0.07)
Basophils Absolute: 0.1 10*3/uL (ref 0.0–0.1)
Basophils Relative: 1 %
Eosinophils Absolute: 0.1 10*3/uL (ref 0.0–0.5)
Eosinophils Relative: 2 %
HCT: 38.8 % — ABNORMAL LOW (ref 39.0–52.0)
Hemoglobin: 13.4 g/dL (ref 13.0–17.0)
Immature Granulocytes: 1 %
Lymphocytes Relative: 42 %
Lymphs Abs: 2.1 10*3/uL (ref 0.7–4.0)
MCH: 31.9 pg (ref 26.0–34.0)
MCHC: 34.5 g/dL (ref 30.0–36.0)
MCV: 92.4 fL (ref 80.0–100.0)
Monocytes Absolute: 1.1 10*3/uL — ABNORMAL HIGH (ref 0.1–1.0)
Monocytes Relative: 22 %
Neutro Abs: 1.6 10*3/uL — ABNORMAL LOW (ref 1.7–7.7)
Neutrophils Relative %: 32 %
Platelet Count: 512 10*3/uL — ABNORMAL HIGH (ref 150–400)
RBC: 4.2 MIL/uL — ABNORMAL LOW (ref 4.22–5.81)
RDW: 13.7 % (ref 11.5–15.5)
WBC Count: 4.9 10*3/uL (ref 4.0–10.5)
nRBC: 0 % (ref 0.0–0.2)

## 2019-08-23 LAB — MAGNESIUM: Magnesium: 2 mg/dL (ref 1.7–2.4)

## 2019-08-23 MED ORDER — PALONOSETRON HCL INJECTION 0.25 MG/5ML
0.2500 mg | Freq: Once | INTRAVENOUS | Status: AC
  Administered 2019-08-23: 0.25 mg via INTRAVENOUS

## 2019-08-23 MED ORDER — POTASSIUM CHLORIDE 2 MEQ/ML IV SOLN
Freq: Once | INTRAVENOUS | Status: AC
  Administered 2019-08-23: 09:00:00 via INTRAVENOUS
  Filled 2019-08-23: qty 10

## 2019-08-23 MED ORDER — SODIUM CHLORIDE 0.9 % IV SOLN
50.0000 mg/m2 | Freq: Once | INTRAVENOUS | Status: AC
  Administered 2019-08-23: 113 mg via INTRAVENOUS
  Filled 2019-08-23: qty 113

## 2019-08-23 MED ORDER — SODIUM CHLORIDE 0.9 % IV SOLN
Freq: Once | INTRAVENOUS | Status: AC
  Administered 2019-08-23: 12:00:00 via INTRAVENOUS
  Filled 2019-08-23: qty 5

## 2019-08-23 MED ORDER — SODIUM CHLORIDE 0.9 % IV SOLN
Freq: Once | INTRAVENOUS | Status: AC
  Administered 2019-08-23: 09:00:00 via INTRAVENOUS
  Filled 2019-08-23: qty 250

## 2019-08-23 MED ORDER — PALONOSETRON HCL INJECTION 0.25 MG/5ML
INTRAVENOUS | Status: AC
  Filled 2019-08-23: qty 5

## 2019-08-23 MED ORDER — SODIUM CHLORIDE 0.9 % IV SOLN
Freq: Once | INTRAVENOUS | Status: AC
  Administered 2019-08-23: 13:00:00 via INTRAVENOUS
  Filled 2019-08-23: qty 250

## 2019-08-23 MED ORDER — SODIUM CHLORIDE 0.9 % IV SOLN
1800.0000 mg | Freq: Once | INTRAVENOUS | Status: AC
  Administered 2019-08-23: 1800 mg via INTRAVENOUS
  Filled 2019-08-23: qty 47.34

## 2019-08-23 NOTE — Progress Notes (Signed)
Message sent to MD for consent attestation.     Ok to proceed with treatment for D1C2 Gemzar/Cisplatin with output of 200 cc per Dr. Alen Blew.

## 2019-08-23 NOTE — Patient Instructions (Signed)
[facility] Discharge Instructions for Patients Receiving Chemotherapy  Today you received the following chemotherapy agents Gemzar; Cisplatin  To help prevent nausea and vomiting after your treatment, we encourage you to take your nausea medication as directed If you develop nausea and vomiting that is not controlled by your nausea medication, call the clinic.   BELOW ARE SYMPTOMS THAT SHOULD BE REPORTED IMMEDIATELY:  *FEVER GREATER THAN 100.5 F  *CHILLS WITH OR WITHOUT FEVER  NAUSEA AND VOMITING THAT IS NOT CONTROLLED WITH YOUR NAUSEA MEDICATION  *UNUSUAL SHORTNESS OF BREATH  *UNUSUAL BRUISING OR BLEEDING  TENDERNESS IN MOUTH AND THROAT WITH OR WITHOUT PRESENCE OF ULCERS  *URINARY PROBLEMS  *BOWEL PROBLEMS  UNUSUAL RASH Items with * indicate a potential emergency and should be followed up as soon as possible.  Feel free to call the clinic should you have any questions or concerns. The clinic phone number is [phone].  Please show the CHEMO ALERT CARD at check-in to the Emergency Department and triage nurse.   

## 2019-08-29 DIAGNOSIS — J3089 Other allergic rhinitis: Secondary | ICD-10-CM | POA: Diagnosis not present

## 2019-08-29 DIAGNOSIS — J301 Allergic rhinitis due to pollen: Secondary | ICD-10-CM | POA: Diagnosis not present

## 2019-08-30 ENCOUNTER — Inpatient Hospital Stay: Payer: Medicare HMO | Attending: Oncology

## 2019-08-30 ENCOUNTER — Inpatient Hospital Stay: Payer: Medicare HMO

## 2019-08-30 ENCOUNTER — Other Ambulatory Visit: Payer: Self-pay

## 2019-08-30 VITALS — BP 127/90 | HR 61 | Temp 98.5°F | Resp 16 | Ht 74.0 in | Wt 211.5 lb

## 2019-08-30 DIAGNOSIS — N289 Disorder of kidney and ureter, unspecified: Secondary | ICD-10-CM | POA: Diagnosis not present

## 2019-08-30 DIAGNOSIS — Z79899 Other long term (current) drug therapy: Secondary | ICD-10-CM | POA: Insufficient documentation

## 2019-08-30 DIAGNOSIS — C679 Malignant neoplasm of bladder, unspecified: Secondary | ICD-10-CM | POA: Insufficient documentation

## 2019-08-30 DIAGNOSIS — D709 Neutropenia, unspecified: Secondary | ICD-10-CM | POA: Diagnosis not present

## 2019-08-30 DIAGNOSIS — Z5111 Encounter for antineoplastic chemotherapy: Secondary | ICD-10-CM | POA: Diagnosis present

## 2019-08-30 DIAGNOSIS — Z7982 Long term (current) use of aspirin: Secondary | ICD-10-CM | POA: Insufficient documentation

## 2019-08-30 LAB — CMP (CANCER CENTER ONLY)
ALT: 17 U/L (ref 0–44)
AST: 18 U/L (ref 15–41)
Albumin: 4.1 g/dL (ref 3.5–5.0)
Alkaline Phosphatase: 47 U/L (ref 38–126)
Anion gap: 9 (ref 5–15)
BUN: 22 mg/dL (ref 8–23)
CO2: 27 mmol/L (ref 22–32)
Calcium: 8.8 mg/dL — ABNORMAL LOW (ref 8.9–10.3)
Chloride: 105 mmol/L (ref 98–111)
Creatinine: 1.16 mg/dL (ref 0.61–1.24)
GFR, Est AFR Am: >60 mL/min (ref >60)
GFR, Estimated: >60 mL/min (ref >60)
Glucose, Bld: 95 mg/dL (ref 70–99)
Potassium: 4.6 mmol/L (ref 3.5–5.1)
Sodium: 141 mmol/L (ref 135–145)
Total Bilirubin: 0.4 mg/dL (ref 0.3–1.2)
Total Protein: 6.3 g/dL — ABNORMAL LOW (ref 6.5–8.1)

## 2019-08-30 LAB — CBC WITH DIFFERENTIAL (CANCER CENTER ONLY)
Abs Immature Granulocytes: 0.01 10*3/uL (ref 0.00–0.07)
Basophils Absolute: 0 10*3/uL (ref 0.0–0.1)
Basophils Relative: 1 %
Eosinophils Absolute: 0 10*3/uL (ref 0.0–0.5)
Eosinophils Relative: 1 %
HCT: 35.2 % — ABNORMAL LOW (ref 39.0–52.0)
Hemoglobin: 12.1 g/dL — ABNORMAL LOW (ref 13.0–17.0)
Immature Granulocytes: 0 %
Lymphocytes Relative: 68 %
Lymphs Abs: 2.5 10*3/uL (ref 0.7–4.0)
MCH: 31.7 pg (ref 26.0–34.0)
MCHC: 34.4 g/dL (ref 30.0–36.0)
MCV: 92.1 fL (ref 80.0–100.0)
Monocytes Absolute: 0.2 10*3/uL (ref 0.1–1.0)
Monocytes Relative: 5 %
Neutro Abs: 0.9 10*3/uL — ABNORMAL LOW (ref 1.7–7.7)
Neutrophils Relative %: 25 %
Platelet Count: 189 10*3/uL (ref 150–400)
RBC: 3.82 MIL/uL — ABNORMAL LOW (ref 4.22–5.81)
RDW: 13.3 % (ref 11.5–15.5)
WBC Count: 3.7 10*3/uL — ABNORMAL LOW (ref 4.0–10.5)
nRBC: 0 % (ref 0.0–0.2)

## 2019-08-30 LAB — MAGNESIUM: Magnesium: 2.2 mg/dL (ref 1.7–2.4)

## 2019-08-30 MED ORDER — SODIUM CHLORIDE 0.9 % IV SOLN
Freq: Once | INTRAVENOUS | Status: DC
  Filled 2019-08-30: qty 250

## 2019-08-30 MED ORDER — PROCHLORPERAZINE MALEATE 10 MG PO TABS
ORAL_TABLET | ORAL | Status: AC
  Filled 2019-08-30: qty 1

## 2019-08-30 MED ORDER — SODIUM CHLORIDE 0.9 % IV SOLN
Freq: Once | INTRAVENOUS | Status: AC
  Administered 2019-08-30: 16:00:00 via INTRAVENOUS
  Filled 2019-08-30: qty 250

## 2019-08-30 MED ORDER — PROCHLORPERAZINE MALEATE 10 MG PO TABS
10.0000 mg | ORAL_TABLET | Freq: Once | ORAL | Status: AC
  Administered 2019-08-30: 10 mg via ORAL

## 2019-08-30 MED ORDER — SODIUM CHLORIDE 0.9 % IV SOLN
800.0000 mg/m2 | Freq: Once | INTRAVENOUS | Status: AC
  Administered 2019-08-30: 1824 mg via INTRAVENOUS
  Filled 2019-08-30: qty 47.97

## 2019-08-30 NOTE — Patient Instructions (Signed)
[facility] Discharge Instructions for Patients Receiving Chemotherapy  Today you received the following chemotherapy agents:  Gemcitibine  To help prevent nausea and vomiting after your treatment, we encourage you to take your nausea medication as prescribed.   If you develop nausea and vomiting that is not controlled by your nausea medication, call the clinic.   BELOW ARE SYMPTOMS THAT SHOULD BE REPORTED IMMEDIATELY:  *FEVER GREATER THAN 100.5 F  *CHILLS WITH OR WITHOUT FEVER  NAUSEA AND VOMITING THAT IS NOT CONTROLLED WITH YOUR NAUSEA MEDICATION  *UNUSUAL SHORTNESS OF BREATH  *UNUSUAL BRUISING OR BLEEDING  TENDERNESS IN MOUTH AND THROAT WITH OR WITHOUT PRESENCE OF ULCERS  *URINARY PROBLEMS  *BOWEL PROBLEMS  UNUSUAL RASH Items with * indicate a potential emergency and should be followed up as soon as possible.  Feel free to call the clinic should you have any questions or concerns. The clinic phone number is [phone].  Please show the CHEMO ALERT CARD at check-in to the Emergency Department and triage nurse.   

## 2019-08-30 NOTE — Progress Notes (Signed)
Per Dr Alen Blew OK to proceed with Gemzar  ANC of 0.9

## 2019-09-04 DIAGNOSIS — J3089 Other allergic rhinitis: Secondary | ICD-10-CM | POA: Diagnosis not present

## 2019-09-04 DIAGNOSIS — J301 Allergic rhinitis due to pollen: Secondary | ICD-10-CM | POA: Diagnosis not present

## 2019-09-05 ENCOUNTER — Other Ambulatory Visit: Payer: Self-pay

## 2019-09-05 ENCOUNTER — Other Ambulatory Visit: Payer: Self-pay | Admitting: Oncology

## 2019-09-05 DIAGNOSIS — C679 Malignant neoplasm of bladder, unspecified: Secondary | ICD-10-CM

## 2019-09-05 NOTE — Telephone Encounter (Signed)
Patient called office requesting to be scheduled for a port-a-cath placement. Patient stated that during his last treatment, he experienced burning during the Gemzar infusion. Dr. Alen Blew made aware and orders have been placed for port-a-cath placement. Patient notified and informed to expect a call from IR to schedule the port placement. Patient verbalized understanding.

## 2019-09-11 ENCOUNTER — Other Ambulatory Visit: Payer: Self-pay | Admitting: Radiology

## 2019-09-12 ENCOUNTER — Ambulatory Visit (HOSPITAL_COMMUNITY)
Admission: RE | Admit: 2019-09-12 | Discharge: 2019-09-12 | Disposition: A | Discharge status: Home / Self Care | Payer: Medicare HMO | Source: Ambulatory Visit | Attending: Oncology | Admitting: Oncology

## 2019-09-12 ENCOUNTER — Encounter (HOSPITAL_COMMUNITY): Payer: Self-pay

## 2019-09-12 ENCOUNTER — Other Ambulatory Visit: Payer: Self-pay | Admitting: Oncology

## 2019-09-12 ENCOUNTER — Other Ambulatory Visit: Payer: Self-pay

## 2019-09-12 DIAGNOSIS — K219 Gastro-esophageal reflux disease without esophagitis: Secondary | ICD-10-CM | POA: Diagnosis not present

## 2019-09-12 DIAGNOSIS — E7800 Pure hypercholesterolemia, unspecified: Secondary | ICD-10-CM | POA: Diagnosis not present

## 2019-09-12 DIAGNOSIS — R69 Illness, unspecified: Secondary | ICD-10-CM | POA: Diagnosis not present

## 2019-09-12 DIAGNOSIS — F419 Anxiety disorder, unspecified: Secondary | ICD-10-CM | POA: Insufficient documentation

## 2019-09-12 DIAGNOSIS — M5136 Other intervertebral disc degeneration, lumbar region: Secondary | ICD-10-CM | POA: Insufficient documentation

## 2019-09-12 DIAGNOSIS — Z7982 Long term (current) use of aspirin: Secondary | ICD-10-CM | POA: Diagnosis not present

## 2019-09-12 DIAGNOSIS — C679 Malignant neoplasm of bladder, unspecified: Secondary | ICD-10-CM | POA: Diagnosis not present

## 2019-09-12 DIAGNOSIS — I2510 Atherosclerotic heart disease of native coronary artery without angina pectoris: Secondary | ICD-10-CM | POA: Insufficient documentation

## 2019-09-12 DIAGNOSIS — I739 Peripheral vascular disease, unspecified: Secondary | ICD-10-CM | POA: Insufficient documentation

## 2019-09-12 DIAGNOSIS — Z9221 Personal history of antineoplastic chemotherapy: Secondary | ICD-10-CM | POA: Insufficient documentation

## 2019-09-12 DIAGNOSIS — E785 Hyperlipidemia, unspecified: Secondary | ICD-10-CM | POA: Insufficient documentation

## 2019-09-12 DIAGNOSIS — I10 Essential (primary) hypertension: Secondary | ICD-10-CM | POA: Insufficient documentation

## 2019-09-12 DIAGNOSIS — M109 Gout, unspecified: Secondary | ICD-10-CM | POA: Insufficient documentation

## 2019-09-12 DIAGNOSIS — Z79899 Other long term (current) drug therapy: Secondary | ICD-10-CM | POA: Diagnosis not present

## 2019-09-12 DIAGNOSIS — Z87891 Personal history of nicotine dependence: Secondary | ICD-10-CM | POA: Insufficient documentation

## 2019-09-12 DIAGNOSIS — Z5111 Encounter for antineoplastic chemotherapy: Secondary | ICD-10-CM | POA: Diagnosis not present

## 2019-09-12 DIAGNOSIS — Z452 Encounter for adjustment and management of vascular access device: Secondary | ICD-10-CM | POA: Diagnosis not present

## 2019-09-12 HISTORY — PX: IR IMAGING GUIDED PORT INSERTION: IMG5740

## 2019-09-12 LAB — CBC
HCT: 35 % — ABNORMAL LOW (ref 39.0–52.0)
Hemoglobin: 11.7 g/dL — ABNORMAL LOW (ref 13.0–17.0)
MCH: 31.8 pg (ref 26.0–34.0)
MCHC: 33.4 g/dL (ref 30.0–36.0)
MCV: 95.1 fL (ref 80.0–100.0)
Platelets: 159 10*3/uL (ref 150–400)
RBC: 3.68 MIL/uL — ABNORMAL LOW (ref 4.22–5.81)
RDW: 14.6 % (ref 11.5–15.5)
WBC: 4.5 10*3/uL (ref 4.0–10.5)
nRBC: 0 % (ref 0.0–0.2)

## 2019-09-12 LAB — PROTIME-INR
INR: 0.9 (ref 0.8–1.2)
Prothrombin Time: 11.6 seconds (ref 11.4–15.2)

## 2019-09-12 MED ORDER — CEFAZOLIN SODIUM-DEXTROSE 2-4 GM/100ML-% IV SOLN
INTRAVENOUS | Status: AC
  Filled 2019-09-12: qty 100

## 2019-09-12 MED ORDER — FENTANYL CITRATE (PF) 100 MCG/2ML IJ SOLN
INTRAMUSCULAR | Status: AC
  Filled 2019-09-12: qty 2

## 2019-09-12 MED ORDER — MIDAZOLAM HCL 2 MG/2ML IJ SOLN
INTRAMUSCULAR | Status: AC
  Filled 2019-09-12: qty 4

## 2019-09-12 MED ORDER — LACTATED RINGERS IV SOLN: INTRAVENOUS | Status: DC

## 2019-09-12 MED ORDER — SODIUM CHLORIDE 0.9 % IV SOLN: Freq: Once | INTRAVENOUS | Status: AC

## 2019-09-12 MED ORDER — MIDAZOLAM HCL 2 MG/2ML IJ SOLN
INTRAMUSCULAR | Status: AC | PRN
  Administered 2019-09-12 (×2): 1 mg via INTRAVENOUS

## 2019-09-12 MED ORDER — CEFAZOLIN SODIUM-DEXTROSE 2-4 GM/100ML-% IV SOLN
2.0000 g | Freq: Once | INTRAVENOUS | Status: AC
  Administered 2019-09-12: 2 g via INTRAVENOUS

## 2019-09-12 MED ORDER — LIDOCAINE HCL (PF) 1 % IJ SOLN
INTRAMUSCULAR | Status: AC | PRN
  Administered 2019-09-12: 10 mL

## 2019-09-12 MED ORDER — FENTANYL CITRATE (PF) 100 MCG/2ML IJ SOLN
INTRAMUSCULAR | Status: AC | PRN
  Administered 2019-09-12 (×2): 50 ug via INTRAVENOUS

## 2019-09-12 MED ORDER — LIDOCAINE HCL 1 % IJ SOLN
INTRAMUSCULAR | Status: AC
  Filled 2019-09-12: qty 20

## 2019-09-12 MED ORDER — LIDOCAINE HCL (PF) 1 % IJ SOLN
INTRAMUSCULAR | Status: AC | PRN
  Administered 2019-09-12: 5 mL

## 2019-09-12 MED ORDER — HEPARIN SOD (PORK) LOCK FLUSH 100 UNIT/ML IV SOLN
INTRAVENOUS | Status: AC
  Filled 2019-09-12: qty 5

## 2019-09-12 MED ORDER — HEPARIN SOD (PORK) LOCK FLUSH 100 UNIT/ML IV SOLN
INTRAVENOUS | Status: AC | PRN
  Administered 2019-09-12: 500 [IU] via INTRAVENOUS

## 2019-09-12 NOTE — H&P (Signed)
Chief Complaint: Patient was seen in consultation today for port placement.  Referring Physician(s): Wyatt Portela  Supervising Physician: Aletta Edouard  Patient Status: [facility] - Out-pt  History of Present Illness: Brandon Haynes is a 78 y.o. 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 Alen Blew who presents today for port placement.  Brandon Haynes began to experience hematuria in August of this year and a CT abd/pelvis was obtained for further evaluation which was notable for a 2.5 x 1.5 x 1.4 cm lesion in the urinary bladder wall highly concerning for urothelial neoplasm. He was referred to urology and underwent transurethral resection with Dr. Gloriann Loan on 06/12/19. He was then referred to medical oncology to discuss further treatment and was subsequently started on systemic chemotherapy. IR has been asked to place a port for continued venous access during chemotherapy.  Brandon Haynes reports that he has been feeling well overall, has been a little tired after chemotherapy treatments but usually resolves with a nap. He states he's had 5 chemotherapy treatments using the veins in his arms but the last treatment burned a lot. He also reports that he has "bad veins in my arms" and that was part of the reason he wanted to have a port placed. He states he will be having his bladder removed because he is concerned about cancer recurrence with radiation only. He is scheduled for chemotherapy tomorrow if the ice is not too bad. He states understanding of the requested procedure and wishes to proceed as planned.   Past Medical History:  Diagnosis Date  . Allergic rhinitis    allergy shots via [place]  . Anxiety   . Bladder cancer ([place]) 05/2019   INVASIVE HIGH GRADE PAPILLARY UROTHELIAL CARCINOMA, invading muscularis propia.  Lesion resected. No mets. Pt  declined radical cystectomy surgery, and elected for chemo + radiation as of 05/2019 onc eval. He then changed his mind and chose radic cystect, is getting neoadjuvant chemo x 4 cycles.  . 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 CAD, multiple vessel    a. CP/near-syncope 11/2014 s/p DES to MLAD, occluded RCA after takeoff of RV marginal with collaterals treated medically, otherwise mild nonobstructive disease. EF 55%;  b. 07/2015 Lexiscan CL: EF 56%, no ischemia/infarct.   . Cataracts, both eyes   . Chronic low back pain   . DDD (degenerative disc disease), lumbar   . GERD (gastroesophageal reflux disease)    PPI bid needed to control sx's  . Gout   . Hearing loss   . History  of basal cell carcinoma   . History of adenomatous polyp of colon    Rpt colonoscopy recommended 02/2020->no further if no high risk lesions at that time.  . Hypercholesteremia   . Hypertension    Hx labile/uncontrolled.  Improved s/p visit to HTN clinic 2019.  . Iron deficiency anemia 2017   Colonoscopy, EGD, and capsule endoscopy w/out obvious cause/source found.  Oral iron helping as of 03/2016.  . Leg pain 03/06/2016  . Nonmelanoma skin cancer   . 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 Peripheral vascular disease ([place])   . Pleural thickening    chronic    Past Surgical History:  Procedure Laterality Date  . back injection  oct 2011, 07/2010, 12/2012   no surgery  . Capsule endoscopy  02/2016   mild duodenitis, o/w NEG  .  CARDIOVASCULAR STRESS TEST  05/2009; 07/2015  . carotid dopplers  11/2014   Bilateral - 1% to 9% ICA stenosis. Vertebral artery flow is  . COLONOSCOPY  08/2008; 02/2016; 02/2017   2017: 12 polyps (adenomatous).  02/2017 repeat TCS--multiple polyps: recall 3 yrs.  . CORONARY ANGIOPLASTY WITH STENT PLACEMENT  12/04/2014   4.0 x 12 mm Synergy stent to the mid LAD  . ESOPHAGOGASTRODUODENOSCOPY  02/2016   Gastritis.  NEG h pylori.    . LE arterial vascular study  02/2016   Per Dr. Radford Pax, no  evidence of peripheral vascular disease  . LEFT HEART CATHETERIZATION WITH CORONARY ANGIOGRAM N/A 12/04/2014   Procedure: LEFT HEART CATHETERIZATION WITH CORONARY ANGIOGRAM;  Surgeon: Jolaine Artist, MD; LAD 99% then aneurysmal, OM1 40%, CFX 50%, RCA 40%, 50% then occluded  . ROTATOR CUFF REPAIR Left 09/2013   left  . SHOULDER INJECTION  feb 2014  . TRANSTHORACIC ECHOCARDIOGRAM  11/2014   EF 50-55%, some wall motion abnormalities noted, grade I diast dysfxn  . TRANSURETHRAL RESECTION OF BLADDER TUMOR N/A 06/12/2019   INVASIVE HIGH GRADE PAPILLARY UROTHELIAL CARCINOMA, invading muscularis propria.  Procedure: TRANSURETHRAL RESECTION OF BLADDER TUMOR (TURBT);  Surgeon: Lucas Mallow, MD;  Location: [facility] ORS;  Service: Urology;  Laterality: N/A;    Allergies: Patient has no known allergies.  Medications: Prior to Admission medications   Medication Sig Start Date End Date Taking? Authorizing Provider  acetaminophen (TYLENOL) 500 MG tablet Take 500 mg by mouth every 6 (six) hours as needed for moderate pain.   Yes [provider]  allopurinol (ZYLOPRIM) 300 MG tablet TAKE 1 TABLET BY MOUTH EVERY DAY 08/21/19  Yes McGowen, [name], MD  amLODipine (NORVASC) 5 MG tablet TAKE 1 TABLET BY MOUTH EVERY DAY 08/21/19  Yes McGowen, [name], MD  aspirin 81 MG tablet Take 81 mg by mouth daily.     Yes [provider]  desonide (DESOWEN) 0.05 % cream Apply 1 application topically 3 (three) times a week.  02/11/15  Yes [provider]  diphenhydrAMINE (BENADRYL) 25 MG tablet Take 50 mg by mouth 3 (three) times daily as needed for itching or allergies.    Yes [provider]  fluticasone (FLONASE) 50 MCG/ACT nasal spray Place 1 spray into both nostrils daily.   Yes [provider]  gabapentin (NEURONTIN) 300 MG capsule Take 300 mg by mouth 3 (three) times daily.    Yes [provider]  loratadine (CLARITIN) 10 MG tablet Take 20 mg by mouth daily.    Yes  [provider]  losartan (COZAAR) 50 MG tablet TAKE 1 TABLET BY MOUTH TWICE A DAY Patient taking differently: Take 50 mg by mouth 2 (two) times daily.  03/21/19  Yes Jerline Pain, MD  metoprolol tartrate (LOPRESSOR) 25 MG tablet Take 1 tablet (25 mg total) by mouth 2 (two) times daily. 03/09/19  Yes Turner, Eber Hong, MD  pantoprazole (PROTONIX) 40 MG tablet Take 1 tablet (40 mg total) by mouth daily. 03/21/19  Yes Armbruster, Carlota Raspberry, MD  prochlorperazine (COMPAZINE) 10 MG tablet Take 1 tablet (10 mg total) by mouth every 6 (six) hours as needed for nausea or vomiting. 07/14/19  Yes Wyatt Portela, MD  rosuvastatin (CRESTOR) 20 MG tablet TAKE 1 TABLET BY MOUTH EVERY DAY 07/20/19  Yes McGowen, [name], MD  traMADol (ULTRAM) 50 MG tablet Take 1 tablet (50 mg total) by mouth 3 (three) times daily as needed. for pain 04/05/19  Yes McGowen, [name], MD  EPINEPHrine 0.3 mg/0.3 mL IJ SOAJ injection Inject 0.3 mg into the muscle as needed for anaphylaxis.  10/04/17   [provider]  nitroGLYCERIN (NITROSTAT) 0.4 MG SL tablet Place 1 tablet (0.4 mg total) under the tongue every 5 (five) minutes as needed for chest pain. 05/04/18   Sueanne Margarita, MD  PREVIDENT 5000 BOOSTER PLUS 1.1 % PSTE Take 1 application by mouth at bedtime.  10/27/14   [provider]  sildenafil (VIAGRA) 100 MG tablet Take 1 tablet (100 mg total) by mouth daily as needed for erectile dysfunction. Patient not taking: Reported on 06/27/2019 06/09/18   Tammi Sou, MD  tamsulosin (FLOMAX) 0.4 MG CAPS capsule Take 0.4 mg by mouth.    [provider]     Family History  Problem Relation Age of Onset  . Lung cancer Father   . Heart disease Father   . Breast cancer Mother   . Congestive Heart Failure Mother   . Diabetes Mother   . Breast cancer Sister   . Heart disease Brother   . COPD Brother   . Bladder Cancer Maternal Grandfather   . Colon cancer Neg Hx   . Esophageal cancer Neg Hx   .  Pancreatic cancer Neg Hx   . Rectal cancer Neg Hx   . Stomach cancer Neg Hx   . Prostate cancer Neg Hx     Social History   Socioeconomic History  . Marital status: Married    Spouse name: Vaughan Basta  . Number of children: 3  . Years of education: Not on file  . Highest education level: Not on file  Occupational History  . Occupation: retired  Tobacco Use  . Smoking status: Former Smoker    Packs/day: 2.00    Years: 35.00    Pack years: 70.00    Types: Cigarettes    Quit date: 09/28/1998    Years since quitting: 20.9  . Smokeless tobacco: Former Systems developer    Types: Snuff    Quit date: 02/09/1986  . Tobacco comment: as a teenager  Substance and Sexual Activity  . Alcohol use: No    Alcohol/week: 0.0 standard drinks    Comment: quit 1994  . Drug use: No  . Sexual activity: Not Currently  Other Topics Concern  . Not on file  Social History Narrative   Married, 3 children.   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   Orig from [place], lived [place] same farm all his life.   Former smoker: quit approx around [place].  80 pack-yr hx.   Alcohol-none, used to drink a lot of beer on weekends.  No drugs.   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   Diet: fair   Social Determinants of Health   Financial Resource Strain:   . Difficulty of Paying Living Expenses: Not on file  Food Insecurity:   . Worried About Charity fundraiser in the Last Year: Not on file  . Ran Out of Food in the Last Year: Not on file  Transportation Needs:   . Lack of Transportation (Medical): Not on file  . Lack of Transportation (Non-Medical): Not on file  Physical Activity:   . Days of Exercise per Week: Not on file  . Minutes of Exercise per Session: Not on file  Stress:   . Feeling of Stress : Not on file  Social Connections:   . Frequency of Communication with Friends and  Family: Not on file  . Frequency of Social Gatherings with Friends and Family: Not on file  . Attends  Religious Services: Not on file  . Active Member of Clubs or Organizations: Not on file  . Attends Archivist Meetings: Not on file  . Marital Status: Not on file     Review of Systems: A 12 point ROS discussed and pertinent positives are indicated in the HPI above.  All other systems are negative.  Review of Systems  Constitutional: Negative for appetite change, chills and fever.  Respiratory: Negative for cough and shortness of breath.   Cardiovascular: Negative for chest pain.  Gastrointestinal: Negative for abdominal pain, nausea and vomiting.  Skin: Negative for rash and wound.  Neurological: Negative for dizziness and headaches.    Vital Signs: There were no vitals taken for this visit.  Physical Exam Vitals reviewed.  Constitutional:      General: He is not in acute distress. HENT:     Head: Normocephalic.     Mouth/Throat:     Mouth: Mucous membranes are moist.     Pharynx: Oropharynx is clear. No oropharyngeal exudate or posterior oropharyngeal erythema.  Cardiovascular:     Rate and Rhythm: Normal rate and regular rhythm.  Pulmonary:     Effort: Pulmonary effort is normal.     Breath sounds: Normal breath sounds.  Abdominal:     General: Bowel sounds are normal. There is no distension.     Palpations: Abdomen is soft.     Tenderness: There is no abdominal tenderness.  Skin:    General: Skin is warm and dry.     Findings: No lesion or rash.  Neurological:     Mental Status: He is alert and oriented to person, place, and time.  Psychiatric:        Mood and Affect: Mood normal.        Behavior: Behavior normal.        Thought Content: Thought content normal.        Judgment: Judgment normal.      MD Evaluation Airway: WNL Heart: WNL Abdomen: WNL Chest/ Lungs: WNL ASA  Classification: 2 Mallampati/Airway Score: One   Imaging: No results found.  Labs:  CBC: Recent Labs    08/02/19 1310 08/16/19 0822 08/23/19 0807 08/30/19 1451   WBC 3.1* 2.6* 4.9 3.7*  HGB 14.3 12.4* 13.4 12.1*  HCT 41.3 35.4* 38.8* 35.2*  PLT 107* 437* 512* 189    COAGS: No results for input(s): INR, APTT in the last 8760 hours.  BMP: Recent Labs    08/02/19 1310 08/16/19 0822 08/23/19 0807 08/30/19 1451  NA 136 141 142 141  K 4.0 3.8 4.1 4.6  CL 100 107 107 105  CO2 25 25 24 27   GLUCOSE 97 97 109* 95  BUN 18 10 13 22   CALCIUM 9.0 8.7* 8.9 8.8*  CREATININE 1.20 1.00 1.05 1.16  GFRNONAA 58* >60 >60 >60  GFRAA >60 >60 >60 >60    LIVER FUNCTION TESTS: Recent Labs    08/02/19 1310 08/16/19 0822 08/23/19 0807 08/30/19 1451  BILITOT 0.6 0.4 0.4 0.4  AST 23 15 16 18   ALT 23 16 14 17   ALKPHOS 48 49 50 47  PROT 6.6 6.3* 6.3* 6.3*  ALBUMIN 4.2 4.0 4.0 4.1    TUMOR MARKERS: No results for input(s): AFPTM, CEA, CA199, CHROMGRNA in the last 8760 hours.  Assessment and Plan:  78 y/o M with recently diagnosed invasive urothelial carcinoma  of the bladder followed by Dr. Dorita Fray and is currently undergoing systemic chemotherapy. IR has been asked to place a port to continue chemotherapy.  Patient has been NPO since 10 pm last night, he took his regular morning medicines with a few sips of water, he does not take blood thinning medications besides ASA 81 mg QD. Afebrile, WBC 4.5, hgb 11.7, plt 159, INR 0.9.  Risks and benefits of image guided port-a-catheter placement were discussed with the patient including, but not limited to bleeding, infection, pneumothorax, or fibrin sheath development and need for additional procedures.  All of the patient's questions were answered, patient is agreeable to proceed.  Consent signed and in chart.  Thank you for this interesting consult.  I greatly enjoyed meeting Brandon Haynes and look forward to participating in their care.  A copy of this report was sent to the requesting provider on this date.  Electronically Signed: Joaquim Nam, PA-C 09/12/2019, 10:28 AM   I spent a total of  30  Minutes   in face to face in clinical consultation, greater than 50% of which was counseling/coordinating care for port placement.

## 2019-09-12 NOTE — Progress Notes (Signed)
Finally spoke with patients wife, Vaughan Basta.  She is patients ride home.  Updated her on procedure time and recovery.

## 2019-09-12 NOTE — Discharge Instructions (Addendum)
Moderate Conscious Sedation, Adult, Care After °These instructions provide you with information about caring for yourself after your procedure. Your health care provider may also give you more specific instructions. Your treatment has been planned according to current medical practices, but problems sometimes occur. Call your health care provider if you have any problems or questions after your procedure. °What can I expect after the procedure? °After your procedure, it is common: °· To feel sleepy for several hours. °· To feel clumsy and have poor balance for several hours. °· To have poor judgment for several hours. °· To vomit if you eat too soon. °Follow these instructions at home: °For at least 24 hours after the procedure: ° °· Do not: °? Participate in activities where you could fall or become injured. °? Drive. °? Use heavy machinery. °? Drink alcohol. °? Take sleeping pills or medicines that cause drowsiness. °? Make important decisions or sign legal documents. °? Take care of children on your own. °· Rest. °Eating and drinking °· Follow the diet recommended by your health care provider. °· If you vomit: °? Drink water, juice, or soup when you can drink without vomiting. °? Make sure you have little or no nausea before eating solid foods. °General instructions °· Have a responsible adult stay with you until you are awake and alert. °· Take over-the-counter and prescription medicines only as told by your health care provider. °· If you smoke, do not smoke without supervision. °· Keep all follow-up visits as told by your health care provider. This is important. °Contact a health care provider if: °· You keep feeling nauseous or you keep vomiting. °· You feel light-headed. °· You develop a rash. °· You have a fever. °Get help right away if: °· You have trouble breathing. °This information is not intended to replace advice given to you by your health care provider. Make sure you discuss any questions you have  with your health care provider. °Document Released: 07/05/2013 Document Revised: 08/27/2017 Document Reviewed: 01/04/2016 °Elsevier Patient Education © 2020 Elsevier Inc[employer] °Implanted Port Home Guide °An implanted port is a device that is placed under the skin. It is usually placed in the chest. The device can be used to give IV medicine, to take blood, or for dialysis. You may have an implanted port if: °· You need IV medicine that would be irritating to the small veins in your hands or arms. °· You need IV medicines, such as antibiotics, for a long period of time. °· You need IV nutrition for a long period of time. °· You need dialysis. °Having a port means that your health care provider will not need to use the veins in your arms for these procedures. You may have fewer limitations when using a port than you would if you used other types of long-term IVs, and you will likely be able to return to normal activities after your incision heals. °An implanted port has two main parts: °· Reservoir. The reservoir is the part where a needle is inserted to give medicines or draw blood. The reservoir is round. After it is placed, it appears as a small, raised area under your skin. °· Catheter. The catheter is a thin, flexible tube that connects the reservoir to a vein. Medicine that is inserted into the reservoir goes into the catheter and then into the vein. °How is my port accessed? °To access your port: °· A numbing cream may be placed on the skin over the port site. °· Your   health care provider will put on a mask and sterile gloves. °· The skin over your port will be cleaned carefully with a germ-killing soap and allowed to dry. °· Your health care provider will gently pinch the port and insert a needle into it. °· Your health care provider will check for a blood return to make sure the port is in the vein and is not clogged. °· If your port needs to remain accessed to get medicine continuously (constant infusion), your  health care provider will place a clear bandage (dressing) over the needle site. The dressing and needle will need to be changed every week, or as told by your health care provider. °What is flushing? °Flushing helps keep the port from getting clogged. Follow instructions from your health care provider about how and when to flush the port. Ports are usually flushed with saline solution or a medicine called heparin. The need for flushing will depend on how the port is used: °· If the port is only used from time to time to give medicines or draw blood, the port may need to be flushed: °? Before and after medicines have been given. °? Before and after blood has been drawn. °? As part of routine maintenance. Flushing may be recommended every 4-6 weeks. °· If a constant infusion is running, the port may not need to be flushed. °· Throw away any syringes in a disposal container that is meant for sharp items (sharps container). You can buy a sharps container from a pharmacy, or you can make one by using an empty hard plastic bottle with a cover. °How long will my port stay implanted? °The port can stay in for as long as your health care provider thinks it is needed. When it is time for the port to come out, a surgery will be done to remove it. The surgery will be similar to the procedure that was done to put the port in. °Follow these instructions at home: ° °· Flush your port as told by your health care provider. °· If you need an infusion over several days, follow instructions from your health care provider about how to take care of your port site. Make sure you: °? Wash your hands with soap and water before you change your dressing. If soap and water are not available, use alcohol-based hand sanitizer. °? Change your dressing as told by your health care provider. °? Place any used dressings or infusion bags into a plastic bag. Throw that bag in the trash. °? Keep the dressing that covers the needle clean and dry. Do not  get it wet. °? Do not use scissors or sharp objects near the tube. °? Keep the tube clamped, unless it is being used. °· Check your port site every day for signs of infection. Check for: °? Redness, swelling, or pain. °? Fluid or blood. °? Pus or a bad smell. °· Protect the skin around the port site. °? Avoid wearing bra straps that rub or irritate the site. °? Protect the skin around your port from seat belts. Place a soft pad over your chest if needed. °· Bathe or shower as told by your health care provider. The site may get wet as long as you are not actively receiving an infusion. °· Return to your normal activities as told by your health care provider. Ask your health care provider what activities are safe for you. °· Carry a medical alert card or wear a medical alert bracelet at all   times. This will let health care providers know that you have an implanted port in case of an emergency. °Get help right away if: °· You have redness, swelling, or pain at the port site. °· You have fluid or blood coming from your port site. °· You have pus or a bad smell coming from the port site. °· You have a fever. °Summary °· Implanted ports are usually placed in the chest for long-term IV access. °· Follow instructions from your health care provider about flushing the port and changing bandages (dressings). °· Take care of the area around your port by avoiding clothing that puts pressure on the area, and by watching for signs of infection. °· Protect the skin around your port from seat belts. Place a soft pad over your chest if needed. °· Get help right away if you have a fever or you have redness, swelling, pain, drainage, or a bad smell at the port site. °This information is not intended to replace advice given to you by your health care provider. Make sure you discuss any questions you have with your health care provider. °Document Released: 09/14/2005 Document Revised: 01/06/2019 Document Reviewed: 10/17/2016 °Elsevier  Patient Education © 2020 Elsevier Inc. ° °

## 2019-09-12 NOTE — Procedures (Signed)
Interventional Radiology Procedure Note  Procedure: Single Lumen Power Port Placement    Access:  Right IJ vein.  Findings: Catheter tip positioned at SVC/RA junction. Port is ready for immediate use.   Complications: None  EBL: < 10 mL  Recommendations:  - Ok to shower in 24 hours - Do not submerge for 7 days - Routine line care   Jamarious Febo T. Zeb Rawl, M.D Pager:  [phone]   

## 2019-09-12 NOTE — Progress Notes (Signed)
Called Nijee Frisbey to confirm she is patients driver today.  Left voicemail for her to return my call.

## 2019-09-13 ENCOUNTER — Inpatient Hospital Stay: Payer: Medicare HMO

## 2019-09-13 ENCOUNTER — Other Ambulatory Visit: Payer: Self-pay

## 2019-09-13 ENCOUNTER — Inpatient Hospital Stay: Payer: Medicare HMO | Admitting: Oncology

## 2019-09-13 VITALS — BP 100/67 | HR 56 | Temp 98.3°F | Resp 18 | Wt 213.8 lb

## 2019-09-13 DIAGNOSIS — C679 Malignant neoplasm of bladder, unspecified: Secondary | ICD-10-CM

## 2019-09-13 DIAGNOSIS — N289 Disorder of kidney and ureter, unspecified: Secondary | ICD-10-CM | POA: Diagnosis not present

## 2019-09-13 DIAGNOSIS — D709 Neutropenia, unspecified: Secondary | ICD-10-CM | POA: Diagnosis not present

## 2019-09-13 DIAGNOSIS — Z5111 Encounter for antineoplastic chemotherapy: Secondary | ICD-10-CM | POA: Diagnosis not present

## 2019-09-13 DIAGNOSIS — Z7982 Long term (current) use of aspirin: Secondary | ICD-10-CM | POA: Diagnosis not present

## 2019-09-13 DIAGNOSIS — Z79899 Other long term (current) drug therapy: Secondary | ICD-10-CM | POA: Diagnosis not present

## 2019-09-13 LAB — CBC WITH DIFFERENTIAL (CANCER CENTER ONLY)
Abs Immature Granulocytes: 0.01 10*3/uL (ref 0.00–0.07)
Basophils Absolute: 0 10*3/uL (ref 0.0–0.1)
Basophils Relative: 0 %
Eosinophils Absolute: 0.2 10*3/uL (ref 0.0–0.5)
Eosinophils Relative: 4 %
HCT: 34.5 % — ABNORMAL LOW (ref 39.0–52.0)
Hemoglobin: 11.8 g/dL — ABNORMAL LOW (ref 13.0–17.0)
Immature Granulocytes: 0 %
Lymphocytes Relative: 38 %
Lymphs Abs: 1.7 10*3/uL (ref 0.7–4.0)
MCH: 32.4 pg (ref 26.0–34.0)
MCHC: 34.2 g/dL (ref 30.0–36.0)
MCV: 94.8 fL (ref 80.0–100.0)
Monocytes Absolute: 0.7 10*3/uL (ref 0.1–1.0)
Monocytes Relative: 15 %
Neutro Abs: 1.9 10*3/uL (ref 1.7–7.7)
Neutrophils Relative %: 43 %
Platelet Count: 225 10*3/uL (ref 150–400)
RBC: 3.64 MIL/uL — ABNORMAL LOW (ref 4.22–5.81)
RDW: 14.6 % (ref 11.5–15.5)
WBC Count: 4.5 10*3/uL (ref 4.0–10.5)
nRBC: 0 % (ref 0.0–0.2)

## 2019-09-13 LAB — CMP (CANCER CENTER ONLY)
ALT: 12 U/L (ref 0–44)
AST: 17 U/L (ref 15–41)
Albumin: 4.1 g/dL (ref 3.5–5.0)
Alkaline Phosphatase: 47 U/L (ref 38–126)
Anion gap: 9 (ref 5–15)
BUN: 10 mg/dL (ref 8–23)
CO2: 26 mmol/L (ref 22–32)
Calcium: 8.9 mg/dL (ref 8.9–10.3)
Chloride: 106 mmol/L (ref 98–111)
Creatinine: 0.98 mg/dL (ref 0.61–1.24)
GFR, Est AFR Am: >60 mL/min (ref >60)
GFR, Estimated: >60 mL/min (ref >60)
Glucose, Bld: 132 mg/dL — ABNORMAL HIGH (ref 70–99)
Potassium: 4.1 mmol/L (ref 3.5–5.1)
Sodium: 141 mmol/L (ref 135–145)
Total Bilirubin: 0.5 mg/dL (ref 0.3–1.2)
Total Protein: 6.2 g/dL — ABNORMAL LOW (ref 6.5–8.1)

## 2019-09-13 LAB — MAGNESIUM: Magnesium: 1.8 mg/dL (ref 1.7–2.4)

## 2019-09-13 MED ORDER — SODIUM CHLORIDE 0.9 % IV SOLN
800.0000 mg/m2 | Freq: Once | INTRAVENOUS | Status: AC
  Administered 2019-09-13: 1824 mg via INTRAVENOUS
  Filled 2019-09-13: qty 47.97

## 2019-09-13 MED ORDER — HEPARIN SOD (PORK) LOCK FLUSH 100 UNIT/ML IV SOLN
500.0000 [IU] | Freq: Once | INTRAVENOUS | Status: AC | PRN
  Administered 2019-09-13: 500 [IU]
  Filled 2019-09-13: qty 5

## 2019-09-13 MED ORDER — POTASSIUM CHLORIDE 2 MEQ/ML IV SOLN
Freq: Once | INTRAVENOUS | Status: AC
  Filled 2019-09-13: qty 10

## 2019-09-13 MED ORDER — SODIUM CHLORIDE 0.9 % IV SOLN
Freq: Once | INTRAVENOUS | Status: AC
  Filled 2019-09-13: qty 5

## 2019-09-13 MED ORDER — SODIUM CHLORIDE 0.9% FLUSH
10.0000 mL | INTRAVENOUS | Status: DC | PRN
  Administered 2019-09-13: 10 mL
  Filled 2019-09-13: qty 10

## 2019-09-13 MED ORDER — SODIUM CHLORIDE 0.9 % IV SOLN
Freq: Once | INTRAVENOUS | Status: AC
  Filled 2019-09-13: qty 250

## 2019-09-13 MED ORDER — PALONOSETRON HCL INJECTION 0.25 MG/5ML
INTRAVENOUS | Status: AC
  Filled 2019-09-13: qty 5

## 2019-09-13 MED ORDER — SODIUM CHLORIDE 0.9 % IV SOLN
50.0000 mg/m2 | Freq: Once | INTRAVENOUS | Status: AC
  Administered 2019-09-13: 113 mg via INTRAVENOUS
  Filled 2019-09-13: qty 113

## 2019-09-13 MED ORDER — PALONOSETRON HCL INJECTION 0.25 MG/5ML
0.2500 mg | Freq: Once | INTRAVENOUS | Status: AC
  Administered 2019-09-13: 0.25 mg via INTRAVENOUS

## 2019-09-13 NOTE — Patient Instructions (Signed)
[facility] Discharge Instructions for Patients Receiving Chemotherapy  Today you received the following chemotherapy agents Gemzar, Cisplatin  To help prevent nausea and vomiting after your treatment, we encourage you to take your nausea medication as directed   If you develop nausea and vomiting that is not controlled by your nausea medication, call the clinic.   BELOW ARE SYMPTOMS THAT SHOULD BE REPORTED IMMEDIATELY:  *FEVER GREATER THAN 100.5 F  *CHILLS WITH OR WITHOUT FEVER  NAUSEA AND VOMITING THAT IS NOT CONTROLLED WITH YOUR NAUSEA MEDICATION  *UNUSUAL SHORTNESS OF BREATH  *UNUSUAL BRUISING OR BLEEDING  TENDERNESS IN MOUTH AND THROAT WITH OR WITHOUT PRESENCE OF ULCERS  *URINARY PROBLEMS  *BOWEL PROBLEMS  UNUSUAL RASH Items with * indicate a potential emergency and should be followed up as soon as possible.  Feel free to call the clinic should you have any questions or concerns. The clinic phone number is [phone].  Please show the CHEMO ALERT CARD at check-in to the Emergency Department and triage nurse.   

## 2019-09-13 NOTE — Progress Notes (Signed)
Hematology and Oncology Follow Up Visit  Brandon Haynes [id number] December 24, 1940 78 y.o. 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  He has no evidence of metastatic disease.   Prior Therapy:  He is status post TURBT on 06/12/2019 which showed high grade invasive urothelial carcinoma invading into the muscularis propria.  Current therapy:  He is receiving neoadjuvant chemotherapy utilizing cisplatin and gemcitabine.  He is here for day 1 cycle 3 of chemotherapy.  Interim History: [name] returns today for repeat evaluation.  Since the last visit, he reports feeling reasonably well without any major complaints.  He had a Port-A-Cath inserted without any issues.  He denies any nausea, vomiting or worsening neuropathy.  He denies any breathing difficulties at this time.  He denies any excessive fatigue or tiredness.  His performance status quality of life remain excellent.  Appetite is unchanged.  He denied headaches, blurry vision, syncope or seizures.  Denies any fevers, chills or sweats.  Denied chest pain, palpitation, orthopnea or leg edema.  Denied cough, wheezing or hemoptysis.  Denied nausea, vomiting or abdominal pain.  Denies any constipation or diarrhea.  Denies any frequency urgency or hesitancy.  Denies any arthralgias or myalgias.  Denies any skin rashes or lesions.  Denies any bleeding or clotting tendency.  Denies any easy bruising.  Denies any hair or nail changes.  Denies any anxiety or depression.  Remaining review of system is negative.        Medications: Unchanged on review. Current Outpatient Medications  Medication Sig Dispense Refill  . acetaminophen (TYLENOL) 500 MG tablet Take 500 mg by mouth every 6 (six) hours as needed for moderate pain.    Marland Kitchen allopurinol (ZYLOPRIM) 300 MG tablet TAKE 1 TABLET BY MOUTH EVERY DAY 90 tablet 0   . amLODipine (NORVASC) 5 MG tablet TAKE 1 TABLET BY MOUTH EVERY DAY 90 tablet 1  . aspirin 81 MG tablet Take 81 mg by mouth daily.      Marland Kitchen desonide (DESOWEN) 0.05 % cream Apply 1 application topically 3 (three) times a week.   1  . diphenhydrAMINE (BENADRYL) 25 MG tablet Take 50 mg by mouth 3 (three) times daily as needed for itching or allergies.     Marland Kitchen EPINEPHrine 0.3 mg/0.3 mL IJ SOAJ injection Inject 0.3 mg into the muscle as needed for anaphylaxis.     . fluticasone (FLONASE) 50 MCG/ACT nasal spray Place 1 spray into both nostrils daily.    Marland Kitchen gabapentin (NEURONTIN) 300 MG capsule Take 300 mg by mouth 3 (three) times daily.     Marland Kitchen loratadine (CLARITIN) 10 MG tablet Take 20 mg by mouth daily.     Marland Kitchen losartan (COZAAR) 50 MG tablet TAKE 1 TABLET BY MOUTH TWICE A DAY (Patient taking differently: Take 50 mg by mouth 2 (two) times daily. ) 60 tablet 11  . metoprolol tartrate (LOPRESSOR) 25 MG tablet Take 1 tablet (25 mg total) by mouth 2 (two) times daily. 180 tablet 3  . nitroGLYCERIN (NITROSTAT) 0.4 MG SL tablet Place 1 tablet (0.4 mg total) under the tongue every 5 (five) minutes as needed for chest pain. 25 tablet 6  . pantoprazole (PROTONIX) 40 MG tablet Take 1 tablet (40 mg total) by mouth daily. 90 tablet 3  . PREVIDENT 5000 BOOSTER PLUS 1.1 % PSTE Take 1 application by mouth at bedtime.   1  .  prochlorperazine (COMPAZINE) 10 MG tablet Take 1 tablet (10 mg total) by mouth every 6 (six) hours as needed for nausea or vomiting. 30 tablet 0  . rosuvastatin (CRESTOR) 20 MG tablet TAKE 1 TABLET BY MOUTH EVERY DAY 90 tablet 1  . sildenafil (VIAGRA) 100 MG tablet Take 1 tablet (100 mg total) by mouth daily as needed for erectile dysfunction. (Patient not taking: Reported on 06/27/2019) 10 tablet 6  . tamsulosin (FLOMAX) 0.4 MG CAPS capsule Take 0.4 mg by mouth.    . traMADol (ULTRAM) 50 MG tablet Take 1 tablet (50 mg total) by mouth 3 (three) times daily as needed. for pain 90 tablet 5   No current  facility-administered medications for this visit.     Allergies: No Known Allergies  Past Medical History, Surgical history, Social history, and Family History updated without any changes.    Physical Exam: Blood pressure 100/67, pulse (!) 56, temperature 98.3 F (36.8 C), temperature source Temporal, resp. rate 18, weight 213 lb 12.8 oz (97 kg), SpO2 98 %.    ECOG: 0    General appearance: Alert, awake without any distress. Head: Atraumatic without abnormalities Oropharynx: Without any thrush or ulcers. Eyes: No scleral icterus. Lymph nodes: No lymphadenopathy noted in the cervical, supraclavicular, or axillary nodes Heart:regular rate and rhythm, without any murmurs or gallops.   Lung: Clear to auscultation without any rhonchi, wheezes or dullness to percussion. Abdomin: Soft, nontender without any shifting dullness or ascites. Musculoskeletal: No clubbing or cyanosis. Neurological: No motor or sensory deficits. Skin: No rashes or lesions.     Lab Results: Lab Results  Component Value Date   WBC 4.5 09/12/2019   HGB 11.7 (L) 09/12/2019   HCT 35.0 (L) 09/12/2019   MCV 95.1 09/12/2019   PLT 159 09/12/2019     Chemistry      Component Value Date/Time   NA 141 08/30/2019 1451   NA 143 03/13/2019 1141   K 4.6 08/30/2019 1451   CL 105 08/30/2019 1451   CO2 27 08/30/2019 1451   BUN 22 08/30/2019 1451   BUN 11 03/13/2019 1141   CREATININE 1.16 08/30/2019 1451   CREATININE 1.03 05/04/2019 1633      Component Value Date/Time   CALCIUM 8.8 (L) 08/30/2019 1451   ALKPHOS 47 08/30/2019 1451   AST 18 08/30/2019 1451   ALT 17 08/30/2019 1451   BILITOT 0.4 08/30/2019 1451       Impression and Plan:  78 year old man with:  1.    Bladder cancer diagnosed in August 2020.  He was found to have T2N0 high-grade urothelial carcinoma without any evidence of metastatic disease.  He continues to tolerate chemotherapy utilizing gemcitabine and cisplatin without  complications.  Risks and benefits of continuing this approach to conclude 4 cycles of therapy was reviewed today.  Potential complications that include nausea, vomiting, myelosuppression as well as peripheral neuropathy was reiterated.  Renal insufficiency associated with platinum therapy was also discussed.  Plan is to complete 4 cycles of therapy.  After completing 4 cycles of therapy he will be evaluated for radical cystectomy.  He is agreeable to proceed at this time.  2.  IV access: Port-A-Cath inserted without any complications and will be in use for the time being.  3.  Antiemetics: Compazine is available to him.  No nausea or vomiting reported.   4.  Neutropenia: Absolute neutrophil count appears adequate at this time without any additional adjustments.  5.  Renal function surveillance: Creatinine clearance remains  close to normal range at this time.  6.  Follow-up: He will return in 1 week for day 8 of therapy and in 3 weeks for the start of cycle 4.  25 minutes was spent with the patient face-to-face today.  More than 50% of time was spent on reviewing his disease status, reviewing laboratory data as well as discussing future plan of care.     Zola Button, MD 12/16/20208:08 AM

## 2019-09-13 NOTE — Progress Notes (Signed)
Per Dr. Alen Blew, [place] to treat with urine output.

## 2019-09-14 ENCOUNTER — Telehealth: Payer: Self-pay | Admitting: Oncology

## 2019-09-14 NOTE — Telephone Encounter (Signed)
Scheduled appt per 12/16 los.  Spoke with pt and he is aware of the appt date and time.

## 2019-09-19 DIAGNOSIS — J3089 Other allergic rhinitis: Secondary | ICD-10-CM | POA: Diagnosis not present

## 2019-09-19 DIAGNOSIS — J301 Allergic rhinitis due to pollen: Secondary | ICD-10-CM | POA: Diagnosis not present

## 2019-09-20 ENCOUNTER — Other Ambulatory Visit: Payer: Self-pay

## 2019-09-20 ENCOUNTER — Inpatient Hospital Stay: Payer: Medicare HMO

## 2019-09-20 VITALS — BP 114/69 | HR 70 | Temp 98.3°F | Resp 18 | Wt 211.2 lb

## 2019-09-20 DIAGNOSIS — Z7982 Long term (current) use of aspirin: Secondary | ICD-10-CM | POA: Diagnosis not present

## 2019-09-20 DIAGNOSIS — C679 Malignant neoplasm of bladder, unspecified: Secondary | ICD-10-CM

## 2019-09-20 DIAGNOSIS — Z95828 Presence of other vascular implants and grafts: Secondary | ICD-10-CM

## 2019-09-20 DIAGNOSIS — D709 Neutropenia, unspecified: Secondary | ICD-10-CM | POA: Diagnosis not present

## 2019-09-20 DIAGNOSIS — N289 Disorder of kidney and ureter, unspecified: Secondary | ICD-10-CM | POA: Diagnosis not present

## 2019-09-20 DIAGNOSIS — Z5111 Encounter for antineoplastic chemotherapy: Secondary | ICD-10-CM | POA: Diagnosis not present

## 2019-09-20 DIAGNOSIS — Z79899 Other long term (current) drug therapy: Secondary | ICD-10-CM | POA: Diagnosis not present

## 2019-09-20 LAB — CMP (CANCER CENTER ONLY)
ALT: 13 U/L (ref 0–44)
AST: 18 U/L (ref 15–41)
Albumin: 4.1 g/dL (ref 3.5–5.0)
Alkaline Phosphatase: 43 U/L (ref 38–126)
Anion gap: 8 (ref 5–15)
BUN: 18 mg/dL (ref 8–23)
CO2: 27 mmol/L (ref 22–32)
Calcium: 8.9 mg/dL (ref 8.9–10.3)
Chloride: 105 mmol/L (ref 98–111)
Creatinine: 0.96 mg/dL (ref 0.61–1.24)
GFR, Est AFR Am: >60 mL/min (ref >60)
GFR, Estimated: >60 mL/min (ref >60)
Glucose, Bld: 133 mg/dL — ABNORMAL HIGH (ref 70–99)
Potassium: 3.9 mmol/L (ref 3.5–5.1)
Sodium: 140 mmol/L (ref 135–145)
Total Bilirubin: 0.3 mg/dL (ref 0.3–1.2)
Total Protein: 6.3 g/dL — ABNORMAL LOW (ref 6.5–8.1)

## 2019-09-20 LAB — CBC WITH DIFFERENTIAL (CANCER CENTER ONLY)
Abs Immature Granulocytes: 0.02 10*3/uL (ref 0.00–0.07)
Basophils Absolute: 0.1 10*3/uL (ref 0.0–0.1)
Basophils Relative: 2 %
Eosinophils Absolute: 0.2 10*3/uL (ref 0.0–0.5)
Eosinophils Relative: 7 %
HCT: 30.9 % — ABNORMAL LOW (ref 39.0–52.0)
Hemoglobin: 10.8 g/dL — ABNORMAL LOW (ref 13.0–17.0)
Immature Granulocytes: 1 %
Lymphocytes Relative: 50 %
Lymphs Abs: 1.6 10*3/uL (ref 0.7–4.0)
MCH: 32.5 pg (ref 26.0–34.0)
MCHC: 35 g/dL (ref 30.0–36.0)
MCV: 93.1 fL (ref 80.0–100.0)
Monocytes Absolute: 0.1 10*3/uL (ref 0.1–1.0)
Monocytes Relative: 4 %
Neutro Abs: 1.1 10*3/uL — ABNORMAL LOW (ref 1.7–7.7)
Neutrophils Relative %: 36 %
Platelet Count: 279 10*3/uL (ref 150–400)
RBC: 3.32 MIL/uL — ABNORMAL LOW (ref 4.22–5.81)
RDW: 14 % (ref 11.5–15.5)
WBC Count: 3.2 10*3/uL — ABNORMAL LOW (ref 4.0–10.5)
nRBC: 0 % (ref 0.0–0.2)

## 2019-09-20 MED ORDER — SODIUM CHLORIDE 0.9% FLUSH
10.0000 mL | INTRAVENOUS | Status: DC | PRN
  Administered 2019-09-20: 10 mL
  Filled 2019-09-20: qty 10

## 2019-09-20 MED ORDER — PROCHLORPERAZINE MALEATE 10 MG PO TABS
10.0000 mg | ORAL_TABLET | Freq: Once | ORAL | Status: AC
  Administered 2019-09-20: 10 mg via ORAL

## 2019-09-20 MED ORDER — SODIUM CHLORIDE 0.9 % IV SOLN
800.0000 mg/m2 | Freq: Once | INTRAVENOUS | Status: AC
  Administered 2019-09-20: 1824 mg via INTRAVENOUS
  Filled 2019-09-20: qty 47.97

## 2019-09-20 MED ORDER — SODIUM CHLORIDE 0.9 % IV SOLN
Freq: Once | INTRAVENOUS | Status: AC
  Filled 2019-09-20: qty 250

## 2019-09-20 MED ORDER — PROCHLORPERAZINE MALEATE 10 MG PO TABS
ORAL_TABLET | ORAL | Status: AC
  Filled 2019-09-20: qty 1

## 2019-09-20 MED ORDER — HEPARIN SOD (PORK) LOCK FLUSH 100 UNIT/ML IV SOLN
500.0000 [IU] | Freq: Once | INTRAVENOUS | Status: AC | PRN
  Administered 2019-09-20: 500 [IU]
  Filled 2019-09-20: qty 5

## 2019-09-20 MED ORDER — SODIUM CHLORIDE 0.9% FLUSH
10.0000 mL | INTRAVENOUS | Status: DC | PRN
  Administered 2019-09-20: 10 mL via INTRAVENOUS
  Filled 2019-09-20: qty 10

## 2019-09-20 NOTE — Progress Notes (Signed)
Per Dr. Alen Blew, okay for patient to receive treatment today with ANC 1.1

## 2019-09-20 NOTE — Patient Instructions (Signed)
[facility] Discharge Instructions for Patients Receiving Chemotherapy  Today you received the following chemotherapy agents:  Gemcitibine  To help prevent nausea and vomiting after your treatment, we encourage you to take your nausea medication as prescribed.   If you develop nausea and vomiting that is not controlled by your nausea medication, call the clinic.   BELOW ARE SYMPTOMS THAT SHOULD BE REPORTED IMMEDIATELY:  *FEVER GREATER THAN 100.5 F  *CHILLS WITH OR WITHOUT FEVER  NAUSEA AND VOMITING THAT IS NOT CONTROLLED WITH YOUR NAUSEA MEDICATION  *UNUSUAL SHORTNESS OF BREATH  *UNUSUAL BRUISING OR BLEEDING  TENDERNESS IN MOUTH AND THROAT WITH OR WITHOUT PRESENCE OF ULCERS  *URINARY PROBLEMS  *BOWEL PROBLEMS  UNUSUAL RASH Items with * indicate a potential emergency and should be followed up as soon as possible.  Feel free to call the clinic should you have any questions or concerns. The clinic phone number is [phone].  Please show the CHEMO ALERT CARD at check-in to the Emergency Department and triage nurse.   

## 2019-09-26 DIAGNOSIS — J3089 Other allergic rhinitis: Secondary | ICD-10-CM | POA: Diagnosis not present

## 2019-09-26 DIAGNOSIS — J301 Allergic rhinitis due to pollen: Secondary | ICD-10-CM | POA: Diagnosis not present

## 2019-09-28 DIAGNOSIS — J301 Allergic rhinitis due to pollen: Secondary | ICD-10-CM | POA: Diagnosis not present

## 2019-09-28 DIAGNOSIS — J3089 Other allergic rhinitis: Secondary | ICD-10-CM | POA: Diagnosis not present

## 2019-10-02 DIAGNOSIS — J3089 Other allergic rhinitis: Secondary | ICD-10-CM | POA: Diagnosis not present

## 2019-10-02 DIAGNOSIS — J301 Allergic rhinitis due to pollen: Secondary | ICD-10-CM | POA: Diagnosis not present

## 2019-10-04 ENCOUNTER — Inpatient Hospital Stay (HOSPITAL_BASED_OUTPATIENT_CLINIC_OR_DEPARTMENT_OTHER): Payer: Medicare HMO | Admitting: Oncology

## 2019-10-04 ENCOUNTER — Inpatient Hospital Stay: Payer: Medicare HMO

## 2019-10-04 ENCOUNTER — Other Ambulatory Visit: Payer: Self-pay

## 2019-10-04 ENCOUNTER — Inpatient Hospital Stay: Payer: Medicare HMO | Attending: Oncology

## 2019-10-04 ENCOUNTER — Telehealth: Payer: Self-pay | Admitting: Oncology

## 2019-10-04 VITALS — BP 103/65 | HR 64 | Temp 97.6°F | Resp 17 | Ht 74.0 in | Wt 217.3 lb

## 2019-10-04 DIAGNOSIS — C679 Malignant neoplasm of bladder, unspecified: Secondary | ICD-10-CM | POA: Diagnosis not present

## 2019-10-04 DIAGNOSIS — Z95828 Presence of other vascular implants and grafts: Secondary | ICD-10-CM

## 2019-10-04 DIAGNOSIS — Z7982 Long term (current) use of aspirin: Secondary | ICD-10-CM | POA: Diagnosis not present

## 2019-10-04 DIAGNOSIS — Z79899 Other long term (current) drug therapy: Secondary | ICD-10-CM | POA: Diagnosis not present

## 2019-10-04 DIAGNOSIS — Z5111 Encounter for antineoplastic chemotherapy: Secondary | ICD-10-CM | POA: Diagnosis present

## 2019-10-04 LAB — CMP (CANCER CENTER ONLY)
ALT: 10 U/L (ref 0–44)
AST: 13 U/L — ABNORMAL LOW (ref 15–41)
Albumin: 3.9 g/dL (ref 3.5–5.0)
Alkaline Phosphatase: 43 U/L (ref 38–126)
Anion gap: 7 (ref 5–15)
BUN: 8 mg/dL (ref 8–23)
CO2: 27 mmol/L (ref 22–32)
Calcium: 8.2 mg/dL — ABNORMAL LOW (ref 8.9–10.3)
Chloride: 107 mmol/L (ref 98–111)
Creatinine: 0.93 mg/dL (ref 0.61–1.24)
GFR, Est AFR Am: >60 mL/min (ref >60)
GFR, Estimated: >60 mL/min (ref >60)
Glucose, Bld: 136 mg/dL — ABNORMAL HIGH (ref 70–99)
Potassium: 3.8 mmol/L (ref 3.5–5.1)
Sodium: 141 mmol/L (ref 135–145)
Total Bilirubin: 0.4 mg/dL (ref 0.3–1.2)
Total Protein: 5.8 g/dL — ABNORMAL LOW (ref 6.5–8.1)

## 2019-10-04 LAB — CBC WITH DIFFERENTIAL (CANCER CENTER ONLY)
Abs Immature Granulocytes: 0.01 10*3/uL (ref 0.00–0.07)
Basophils Absolute: 0 10*3/uL (ref 0.0–0.1)
Basophils Relative: 0 %
Eosinophils Absolute: 0.1 10*3/uL (ref 0.0–0.5)
Eosinophils Relative: 4 %
HCT: 26.6 % — ABNORMAL LOW (ref 39.0–52.0)
Hemoglobin: 9.1 g/dL — ABNORMAL LOW (ref 13.0–17.0)
Immature Granulocytes: 0 %
Lymphocytes Relative: 44 %
Lymphs Abs: 1.4 10*3/uL (ref 0.7–4.0)
MCH: 33 pg (ref 26.0–34.0)
MCHC: 34.2 g/dL (ref 30.0–36.0)
MCV: 96.4 fL (ref 80.0–100.0)
Monocytes Absolute: 0.5 10*3/uL (ref 0.1–1.0)
Monocytes Relative: 14 %
Neutro Abs: 1.2 10*3/uL — ABNORMAL LOW (ref 1.7–7.7)
Neutrophils Relative %: 38 %
Platelet Count: 130 10*3/uL — ABNORMAL LOW (ref 150–400)
RBC: 2.76 MIL/uL — ABNORMAL LOW (ref 4.22–5.81)
RDW: 16.9 % — ABNORMAL HIGH (ref 11.5–15.5)
WBC Count: 3.2 10*3/uL — ABNORMAL LOW (ref 4.0–10.5)
nRBC: 0.6 % — ABNORMAL HIGH (ref 0.0–0.2)

## 2019-10-04 MED ORDER — PALONOSETRON HCL INJECTION 0.25 MG/5ML
INTRAVENOUS | Status: AC
  Filled 2019-10-04: qty 5

## 2019-10-04 MED ORDER — SODIUM CHLORIDE 0.9% FLUSH
10.0000 mL | INTRAVENOUS | Status: DC | PRN
  Administered 2019-10-04: 17:00:00 10 mL
  Filled 2019-10-04: qty 10

## 2019-10-04 MED ORDER — PALONOSETRON HCL INJECTION 0.25 MG/5ML
0.2500 mg | Freq: Once | INTRAVENOUS | Status: AC
  Administered 2019-10-04: 12:00:00 0.25 mg via INTRAVENOUS

## 2019-10-04 MED ORDER — SODIUM CHLORIDE 0.9 % IV SOLN
800.0000 mg/m2 | Freq: Once | INTRAVENOUS | Status: AC
  Administered 2019-10-04: 13:00:00 1824 mg via INTRAVENOUS
  Filled 2019-10-04: qty 47.97

## 2019-10-04 MED ORDER — POTASSIUM CHLORIDE 2 MEQ/ML IV SOLN
Freq: Once | INTRAVENOUS | Status: AC
  Filled 2019-10-04: qty 10

## 2019-10-04 MED ORDER — SODIUM CHLORIDE 0.9 % IV SOLN
Freq: Once | INTRAVENOUS | Status: DC
  Filled 2019-10-04: qty 250

## 2019-10-04 MED ORDER — SODIUM CHLORIDE 0.9% FLUSH
10.0000 mL | INTRAVENOUS | Status: DC | PRN
  Administered 2019-10-04: 10 mL
  Filled 2019-10-04: qty 10

## 2019-10-04 MED ORDER — SODIUM CHLORIDE 0.9 % IV SOLN
50.0000 mg/m2 | Freq: Once | INTRAVENOUS | Status: AC
  Administered 2019-10-04: 113 mg via INTRAVENOUS
  Filled 2019-10-04: qty 113

## 2019-10-04 MED ORDER — HEPARIN SOD (PORK) LOCK FLUSH 100 UNIT/ML IV SOLN
500.0000 [IU] | Freq: Once | INTRAVENOUS | Status: AC | PRN
  Administered 2019-10-04: 17:00:00 500 [IU]
  Filled 2019-10-04: qty 5

## 2019-10-04 MED ORDER — SODIUM CHLORIDE 0.9 % IV SOLN
Freq: Once | INTRAVENOUS | Status: AC
  Filled 2019-10-04: qty 250

## 2019-10-04 MED ORDER — SODIUM CHLORIDE 0.9 % IV SOLN
Freq: Once | INTRAVENOUS | Status: AC
  Filled 2019-10-04: qty 5

## 2019-10-04 NOTE — Progress Notes (Signed)
Hematology and Oncology Follow Up Visit  Brandon Haynes [id number] April 26, 1941 79 y.o. 10/04/2019 9:03 AM McGowen, Brandon Haynes, MDMcGowen, Brandon Blackwater, MD   Principle Diagnosis: 79 year old man with bladder cancer diagnosed in August 2020.  He was found to have T2N0 high-grade urothelial carcinoma at that time.   Prior Therapy:  He is status post TURBT on 06/12/2019 which showed high grade invasive urothelial carcinoma invading into the muscularis propria.  Current therapy:  He is receiving neoadjuvant chemotherapy utilizing cisplatin and gemcitabine.  He is here for day 1 cycle 4 of chemotherapy.  Interim History: [name] returns today for a follow-up.  Since the last visit, he reports no major changes in his health.  He continues to tolerate chemotherapy without any complications.  He denies any nausea, vomiting or abdominal pain.  He does report some mild fatigue and tiredness.  He does report some occasional bloating.  No worsening neuropathy at this time.       Medications: Unchanged on review. Current Outpatient Medications  Medication Sig Dispense Refill  . acetaminophen (TYLENOL) 500 MG tablet Take 500 mg by mouth every 6 (six) hours as needed for moderate pain.    Marland Kitchen allopurinol (ZYLOPRIM) 300 MG tablet TAKE 1 TABLET BY MOUTH EVERY DAY 90 tablet 0  . amLODipine (NORVASC) 5 MG tablet TAKE 1 TABLET BY MOUTH EVERY DAY 90 tablet 1  . aspirin 81 MG tablet Take 81 mg by mouth daily.      Marland Kitchen desonide (DESOWEN) 0.05 % cream Apply 1 application topically 3 (three) times a week.   1  . diphenhydrAMINE (BENADRYL) 25 MG tablet Take 50 mg by mouth 3 (three) times daily as needed for itching or allergies.     Marland Kitchen EPINEPHrine 0.3 mg/0.3 mL IJ SOAJ injection Inject 0.3 mg into the muscle as needed for anaphylaxis.     . fluticasone (FLONASE) 50 MCG/ACT nasal spray Place 1 spray into both nostrils daily.    Marland Kitchen gabapentin (NEURONTIN) 300 MG capsule Take 300 mg by mouth 3 (three) times daily.     Marland Kitchen  loratadine (CLARITIN) 10 MG tablet Take 20 mg by mouth daily.     Marland Kitchen losartan (COZAAR) 50 MG tablet TAKE 1 TABLET BY MOUTH TWICE A DAY (Patient taking differently: Take 50 mg by mouth 2 (two) times daily. ) 60 tablet 11  . metoprolol tartrate (LOPRESSOR) 25 MG tablet Take 1 tablet (25 mg total) by mouth 2 (two) times daily. 180 tablet 3  . nitroGLYCERIN (NITROSTAT) 0.4 MG SL tablet Place 1 tablet (0.4 mg total) under the tongue every 5 (five) minutes as needed for chest pain. 25 tablet 6  . pantoprazole (PROTONIX) 40 MG tablet Take 1 tablet (40 mg total) by mouth daily. 90 tablet 3  . PREVIDENT 5000 BOOSTER PLUS 1.1 % PSTE Take 1 application by mouth at bedtime.   1  . prochlorperazine (COMPAZINE) 10 MG tablet Take 1 tablet (10 mg total) by mouth every 6 (six) hours as needed for nausea or vomiting. 30 tablet 0  . rosuvastatin (CRESTOR) 20 MG tablet TAKE 1 TABLET BY MOUTH EVERY DAY 90 tablet 1  . sildenafil (VIAGRA) 100 MG tablet Take 1 tablet (100 mg total) by mouth daily as needed for erectile dysfunction. (Patient not taking: Reported on 06/27/2019) 10 tablet 6  . tamsulosin (FLOMAX) 0.4 MG CAPS capsule Take 0.4 mg by mouth.    . traMADol (ULTRAM) 50 MG tablet Take 1 tablet (50 mg total) by mouth 3 (three)  times daily as needed. for pain 90 tablet 5   No current facility-administered medications for this visit.     Allergies: No Known Allergies  Past Medical History, Surgical history, Social history, and Family History updated without any changes.    Physical Exam: Blood pressure 103/65, pulse 64, temperature 97.6 F (36.4 C), temperature source Temporal, resp. rate 17, height 6\' 2"  (1.88 m), weight 217 lb 4.8 oz (98.6 kg), SpO2 99 %.    ECOG: 0    General appearance: Comfortable appearing without any discomfort Head: Normocephalic without any trauma Oropharynx: Mucous membranes are moist and pink without any thrush or ulcers. Eyes: Pupils are equal and round reactive to  light. Lymph nodes: No cervical, supraclavicular, inguinal or axillary lymphadenopathy.   Heart:regular rate and rhythm.  S1 and S2 without leg edema. Lung: Clear without any rhonchi or wheezes.  No dullness to percussion. Abdomin: Soft, nontender, nondistended with good bowel sounds.  No hepatosplenomegaly. Musculoskeletal: No joint deformity or effusion.  Full range of motion noted. Neurological: No deficits noted on motor, sensory and deep tendon reflex exam. Skin: No petechial rash or dryness.  Appeared moist.  Psychiatric: Mood and affect appeared appropriate.     Lab Results: Lab Results  Component Value Date   WBC 3.2 (L) 09/20/2019   HGB 10.8 (L) 09/20/2019   HCT 30.9 (L) 09/20/2019   MCV 93.1 09/20/2019   PLT 279 09/20/2019     Chemistry      Component Value Date/Time   NA 140 09/20/2019 1301   NA 143 03/13/2019 1141   K 3.9 09/20/2019 1301   CL 105 09/20/2019 1301   CO2 27 09/20/2019 1301   BUN 18 09/20/2019 1301   BUN 11 03/13/2019 1141   CREATININE 0.96 09/20/2019 1301   CREATININE 1.03 05/04/2019 1633      Component Value Date/Time   CALCIUM 8.9 09/20/2019 1301   ALKPHOS 43 09/20/2019 1301   AST 18 09/20/2019 1301   ALT 13 09/20/2019 1301   BILITOT 0.3 09/20/2019 1301       Impression and Plan:  79 year old man with:  1.    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   He is currently receiving neoadjuvant chemotherapy with planned radical cystectomy upon completing treatments.  Risks and benefits of proceeding with cycle 4 of chemotherapy was reviewed.  Potential complications were reiterated including nausea, fatigue myelosuppression.  He is agreeable to proceed and the plan is to repeat imaging studies after the cycle for staging purposes.  If his CT scan showed no evidence of disease, radical cystectomy will be scheduled with Dr. Tresa Moore.  2.  IV access: Port-A-Cath remains in place without any issues.  3.   Antiemetics: No nausea or vomiting reported at this time.  Antiemetics are available to him including Compazine.   4.  Neutropenia: Labs are currently pending from today but if his absolute neutrophil count is 1000 or above he will proceed with chemotherapy.  5.  Renal function surveillance: Kidney function remains close to baseline.  We will monitor on cisplatin therapy.  6.  Follow-up: In 1 week for day 8 of this current cycle.  He will return after that for repeat staging work-up.  25 minutes was spent on this encounter and was dedicated to reviewing his disease status, reviewing complications related to therapy and future plan of care coordination.     Zola Button, MD 1/6/20219:03 AM

## 2019-10-04 NOTE — Progress Notes (Signed)
Okay to treat today with Neut. 1.2, per Dr. Alen Blew.

## 2019-10-04 NOTE — Telephone Encounter (Signed)
Scheduled appt per 1/6 los. 

## 2019-10-04 NOTE — Patient Instructions (Signed)
[facility] Discharge Instructions for Patients Receiving Chemotherapy  Today you received the following chemotherapy agents Gemzar, Cisplatin  To help prevent nausea and vomiting after your treatment, we encourage you to take your nausea medication as directed   If you develop nausea and vomiting that is not controlled by your nausea medication, call the clinic.   BELOW ARE SYMPTOMS THAT SHOULD BE REPORTED IMMEDIATELY:  *FEVER GREATER THAN 100.5 F  *CHILLS WITH OR WITHOUT FEVER  NAUSEA AND VOMITING THAT IS NOT CONTROLLED WITH YOUR NAUSEA MEDICATION  *UNUSUAL SHORTNESS OF BREATH  *UNUSUAL BRUISING OR BLEEDING  TENDERNESS IN MOUTH AND THROAT WITH OR WITHOUT PRESENCE OF ULCERS  *URINARY PROBLEMS  *BOWEL PROBLEMS  UNUSUAL RASH Items with * indicate a potential emergency and should be followed up as soon as possible.  Feel free to call the clinic should you have any questions or concerns. The clinic phone number is [phone].  Please show the CHEMO ALERT CARD at check-in to the Emergency Department and triage nurse.   

## 2019-10-05 ENCOUNTER — Ambulatory Visit (INDEPENDENT_AMBULATORY_CARE_PROVIDER_SITE_OTHER): Payer: Medicare HMO | Admitting: Family Medicine

## 2019-10-05 ENCOUNTER — Encounter: Payer: Self-pay | Admitting: Family Medicine

## 2019-10-05 VITALS — BP 118/63 | HR 90

## 2019-10-05 DIAGNOSIS — E7800 Pure hypercholesterolemia, unspecified: Secondary | ICD-10-CM

## 2019-10-05 DIAGNOSIS — G894 Chronic pain syndrome: Secondary | ICD-10-CM | POA: Diagnosis not present

## 2019-10-05 DIAGNOSIS — M545 Low back pain, unspecified: Secondary | ICD-10-CM

## 2019-10-05 DIAGNOSIS — M792 Neuralgia and neuritis, unspecified: Secondary | ICD-10-CM

## 2019-10-05 DIAGNOSIS — I10 Essential (primary) hypertension: Secondary | ICD-10-CM | POA: Diagnosis not present

## 2019-10-05 DIAGNOSIS — G8929 Other chronic pain: Secondary | ICD-10-CM | POA: Diagnosis not present

## 2019-10-05 MED ORDER — TRAMADOL HCL 50 MG PO TABS: 50.0000 mg | ORAL_TABLET | Freq: Three times a day (TID) | ORAL | 5 refills | Status: DC | PRN

## 2019-10-05 NOTE — Progress Notes (Signed)
Virtual Visit via Video Note  I connected with Brandon Haynes on 10/05/19 at  9:00 AM EST by a video enabled telemedicine application and verified that I am speaking with the correct person using two identifiers.  Location patient: home Location provider:work or home office Persons participating in the virtual visit: patient, provider  I discussed the limitations of evaluation and management by telemedicine and the availability of in person appointments. The patient expressed understanding and agreed to proceed.  Telemedicine visit is a necessity given the COVID-19 restrictions in place at the current time.  HPI: Patient is a 79 y.o. Caucasian male who presents for 6 mo f/u chronic pain syndrome, HTN, HLD. He was dx'd with bladder ca 05/2019 and is under the care of the urologist and oncologists for this. He started cycle 4 of neoadjuvant chemo (will be getting radical cystectomy) yesterday. Restaging CT 10/25/19 planned. Then f/u 11/01/19 with med onc. March is likely time of surgery.  Indication for chronic opioid:chronic low back pain (DDD) and bilat LL neuropathic pain. Opioids r'xd to maximize functioning and quality of life. Brandon Haynes has gotten periodic ESI but has no hx of back surgery Medication and dose:tramadol 50mg , 1tid prn # pills per month: 90 Last UDS date:10/14/18 Opioid Treatment Agreement signed (Y/N):Y, 07/22/18 Opioid Treatment Agreement last reviewed with patient:today [facility] reviewed this encounter (include red flags):today, no red flags.   INTERIM HX: Pain in back is mild to moderate. Takes avg of 3 tramadol tabs per day. If takes tylenol 500mg  with his tramadol it brings the pain into level that is tolerable and he is functional. No constipation.  No oversedation.  PMP AWARE reviewed today: most recent rx for tramadol 50 mg was filled 08/19/19, # 61, rx by me. No red flags.  HTN: checks bp daily and always <130/80. Lytes/cr from yesterday reviewed: all normal. CBC  yesterday showed mild pancytopenia.  HLD: lipids excellent 6 mo ago, tolerating 20mg  crestor fine.  ROS: no fevers, no CP, no SOB, no wheezing, no cough, no dizziness, no HAs, no rashes, no melena/hematochezia.  No polyuria or polydipsia.  No myalgias or arthralgias.  Some fatigue today b/c of chemo yesterday.   Past Medical History:  Diagnosis Date  . Allergic rhinitis    allergy shots via [place]  . Anxiety   . Bladder cancer ([place]) 05/2019   INVASIVE HIGH GRADE PAPILLARY UROTHELIAL CARCINOMA, invading muscularis propia.  Lesion resected. No mets. Brandon Haynes declined radical cystectomy surgery, and elected for chemo + radiation as of 05/2019 onc eval. He then changed his mind and chose radic cystect, is getting neoadjuvant chemo x 4 cycles.  . 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 CAD, multiple vessel    a. CP/near-syncope 11/2014 s/p DES to MLAD, occluded RCA after takeoff of RV marginal with collaterals treated medically, otherwise mild nonobstructive disease. EF 55%;  b. 07/2015 Lexiscan CL: EF 56%, no ischemia/infarct.   . Cataracts, both eyes   . Chronic low back pain   . DDD (degenerative disc disease), lumbar   . GERD (gastroesophageal reflux disease)    PPI bid needed to control sx's  . Gout   . Hearing loss   . History  of basal cell carcinoma   . History of adenomatous polyp of colon    Rpt colonoscopy recommended 02/2020->no further if no high risk lesions at that time.  . Hypercholesteremia   . Hypertension    Hx labile/uncontrolled.  Improved s/p visit to HTN clinic 2019.  . Iron deficiency anemia 2017  Colonoscopy, EGD, and capsule endoscopy w/out obvious cause/source found.  Oral iron helping as of 03/2016.  . Leg pain 03/06/2016  . Nonmelanoma skin cancer   . 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 Peripheral vascular disease ([place])   . Pleural thickening    chronic    Past Surgical History:  Procedure Laterality Date  . back injection  oct 2011, 07/2010, 12/2012    no surgery  . Capsule endoscopy  02/2016   mild duodenitis, o/w NEG  . CARDIOVASCULAR STRESS TEST  05/2009; 07/2015  . carotid dopplers  11/2014   Bilateral - 1% to 9% ICA stenosis. Vertebral artery flow is  . COLONOSCOPY  08/2008; 02/2016; 02/2017   2017: 12 polyps (adenomatous).  02/2017 repeat TCS--multiple polyps: recall 3 yrs.  . CORONARY ANGIOPLASTY WITH STENT PLACEMENT  12/04/2014   4.0 x 12 mm Synergy stent to the mid LAD  . ESOPHAGOGASTRODUODENOSCOPY  02/2016   Gastritis.  NEG h pylori.    . IR IMAGING GUIDED PORT INSERTION  09/12/2019  . LE arterial vascular study  02/2016   Per Dr. Radford Pax, no evidence of peripheral vascular disease  . LEFT HEART CATHETERIZATION WITH CORONARY ANGIOGRAM N/A 12/04/2014   Procedure: LEFT HEART CATHETERIZATION WITH CORONARY ANGIOGRAM;  Surgeon: Jolaine Artist, MD; LAD 99% then aneurysmal, OM1 40%, CFX 50%, RCA 40%, 50% then occluded  . ROTATOR CUFF REPAIR Left 09/2013   left  . SHOULDER INJECTION  feb 2014  . TRANSTHORACIC ECHOCARDIOGRAM  11/2014   EF 50-55%, some wall motion abnormalities noted, grade I diast dysfxn  . TRANSURETHRAL RESECTION OF BLADDER TUMOR N/A 06/12/2019   INVASIVE HIGH GRADE PAPILLARY UROTHELIAL CARCINOMA, invading muscularis propria.  Procedure: TRANSURETHRAL RESECTION OF BLADDER TUMOR (TURBT);  Surgeon: Lucas Mallow, MD;  Location: [facility];  Service: Urology;  Laterality: N/A;    Family History  Problem Relation Age of Onset  . Lung cancer Father   . Heart disease Father   . Breast cancer Mother   . Congestive Heart Failure Mother   . Diabetes Mother   . Breast cancer Sister   . Heart disease Brother   . COPD Brother   . Bladder Cancer Maternal Grandfather   . Colon cancer Neg Hx   . Esophageal cancer Neg Hx   . Pancreatic cancer Neg Hx   . Rectal cancer Neg Hx   . Stomach cancer Neg Hx   . Prostate cancer Neg Hx      Current Outpatient Medications:  .  acetaminophen (TYLENOL) 500 MG tablet, Take 500 mg by  mouth every 6 (six) hours as needed for moderate pain., Disp: , Rfl:  .  allopurinol (ZYLOPRIM) 300 MG tablet, TAKE 1 TABLET BY MOUTH EVERY DAY, Disp: 90 tablet, Rfl: 0 .  amLODipine (NORVASC) 5 MG tablet, TAKE 1 TABLET BY MOUTH EVERY DAY, Disp: 90 tablet, Rfl: 1 .  aspirin 81 MG tablet, Take 81 mg by mouth daily.  , Disp: , Rfl:  .  desonide (DESOWEN) 0.05 % cream, Apply 1 application topically 3 (three) times a week. , Disp: , Rfl: 1 .  diphenhydrAMINE (BENADRYL) 25 MG tablet, Take 50 mg by mouth 3 (three) times daily as needed for itching or allergies. , Disp: , Rfl:  .  fluticasone (FLONASE) 50 MCG/ACT nasal spray, Place 1 spray into both nostrils daily., Disp: , Rfl:  .  gabapentin (NEURONTIN) 300 MG capsule, Take 300 mg by mouth 3 (three) times daily. , Disp: , Rfl:  .  loratadine (CLARITIN) 10 MG tablet, Take 20 mg by mouth daily. , Disp: , Rfl:  .  losartan (COZAAR) 50 MG tablet, TAKE 1 TABLET BY MOUTH TWICE A DAY (Patient taking differently: Take 50 mg by mouth 2 (two) times daily. ), Disp: 60 tablet, Rfl: 11 .  metoprolol tartrate (LOPRESSOR) 25 MG tablet, Take 1 tablet (25 mg total) by mouth 2 (two) times daily., Disp: 180 tablet, Rfl: 3 .  nitroGLYCERIN (NITROSTAT) 0.4 MG SL tablet, Place 1 tablet (0.4 mg total) under the tongue every 5 (five) minutes as needed for chest pain., Disp: 25 tablet, Rfl: 6 .  pantoprazole (PROTONIX) 40 MG tablet, Take 1 tablet (40 mg total) by mouth daily., Disp: 90 tablet, Rfl: 3 .  PREVIDENT 5000 BOOSTER PLUS 1.1 % PSTE, Take 1 application by mouth at bedtime. , Disp: , Rfl: 1 .  prochlorperazine (COMPAZINE) 10 MG tablet, Take 1 tablet (10 mg total) by mouth every 6 (six) hours as needed for nausea or vomiting., Disp: 30 tablet, Rfl: 0 .  rosuvastatin (CRESTOR) 20 MG tablet, TAKE 1 TABLET BY MOUTH EVERY DAY, Disp: 90 tablet, Rfl: 1 .  sildenafil (VIAGRA) 100 MG tablet, Take 1 tablet (100 mg total) by mouth daily as needed for erectile dysfunction., Disp: 10  tablet, Rfl: 6 .  tamsulosin (FLOMAX) 0.4 MG CAPS capsule, Take 0.4 mg by mouth., Disp: , Rfl:  .  traMADol (ULTRAM) 50 MG tablet, Take 1 tablet (50 mg total) by mouth 3 (three) times daily as needed. for pain, Disp: 90 tablet, Rfl: 5 .  EPINEPHrine 0.3 mg/0.3 mL IJ SOAJ injection, Inject 0.3 mg into the muscle as needed for anaphylaxis. , Disp: , Rfl:   EXAM:  VITALS per patient if applicable: BP 99991111 (BP Location: Left Arm, Patient Position: Sitting, Cuff Size: Large)   Pulse 90    GENERAL: alert, oriented, appears well and in no acute distress  HEENT: atraumatic, conjunttiva clear, no obvious abnormalities on inspection of external nose and ears  NECK: normal movements of the head and neck  LUNGS: on inspection no signs of respiratory distress, breathing rate appears normal, no obvious gross SOB, gasping or wheezing  CV: no obvious cyanosis  MS: moves all visible extremities without noticeable abnormality  PSYCH/NEURO: pleasant and cooperative, no obvious depression or anxiety, speech and thought processing grossly intact  LABS: none today    Chemistry      Component Value Date/Time   NA 141 10/04/2019 0845   NA 143 03/13/2019 1141   K 3.8 10/04/2019 0845   CL 107 10/04/2019 0845   CO2 27 10/04/2019 0845   BUN 8 10/04/2019 0845   BUN 11 03/13/2019 1141   CREATININE 0.93 10/04/2019 0845   CREATININE 1.03 05/04/2019 1633      Component Value Date/Time   CALCIUM 8.2 (L) 10/04/2019 0845   ALKPHOS 43 10/04/2019 0845   AST 13 (L) 10/04/2019 0845   ALT 10 10/04/2019 0845   BILITOT 0.4 10/04/2019 0845     Lab Results  Component Value Date   WBC 3.2 (L) 10/04/2019   HGB 9.1 (L) 10/04/2019   HCT 26.6 (L) 10/04/2019   MCV 96.4 10/04/2019   PLT 130 (L) 10/04/2019   Lab Results  Component Value Date   CHOL 119 03/13/2019   HDL 54 03/13/2019   LDLCALC 44 03/13/2019   TRIG 106 03/13/2019   CHOLHDL 2.2 03/13/2019   Lab Results  Component Value Date   HGBA1C 5.2  04/05/2019  ASSESSMENT AND PLAN:  Discussed the following assessment and plan:  No diagnosis found.  1) Chronic pain syndrome: bilat chronic LBP w/out radiculopathy, chronic bilat LL neuropathic pain. Doing well on 50mg  tramadol + 500 mg tylenol each dose. Tramadol 50mg , 1 tid prn, #90, RF x 5 eRx'd today.  2) HTN: The current medical regimen is effective;  continue present plan and medications. Lytes/cr excellent yesterday.  3) HLD: tolerating statin.  FLP excellent 6 mo ago. Plan repeat 6 mo.  4) Bladder ca: he's hanging in there well with chemo. Undergoes restaging soon, then radical cystectomy. He is getting regular CBC and CMET monitoring with oncology.   I discussed the assessment and treatment plan with the patient. The patient was provided an opportunity to ask questions and all were answered. The patient agreed with the plan and demonstrated an understanding of the instructions.   The patient was advised to call back or seek an in-person evaluation if the symptoms worsen or if the condition fails to improve as anticipated.  F/u: 3 mo  Signed:  Crissie Sickles, MD           10/05/2019

## 2019-10-07 ENCOUNTER — Other Ambulatory Visit: Payer: Self-pay | Admitting: Family Medicine

## 2019-10-10 DIAGNOSIS — J301 Allergic rhinitis due to pollen: Secondary | ICD-10-CM | POA: Diagnosis not present

## 2019-10-10 DIAGNOSIS — R350 Frequency of micturition: Secondary | ICD-10-CM | POA: Diagnosis not present

## 2019-10-10 DIAGNOSIS — J3089 Other allergic rhinitis: Secondary | ICD-10-CM | POA: Diagnosis not present

## 2019-10-10 DIAGNOSIS — C672 Malignant neoplasm of lateral wall of bladder: Secondary | ICD-10-CM | POA: Diagnosis not present

## 2019-10-11 ENCOUNTER — Inpatient Hospital Stay: Payer: Medicare HMO

## 2019-10-11 ENCOUNTER — Other Ambulatory Visit: Payer: Self-pay

## 2019-10-11 DIAGNOSIS — Z95828 Presence of other vascular implants and grafts: Secondary | ICD-10-CM

## 2019-10-11 DIAGNOSIS — C679 Malignant neoplasm of bladder, unspecified: Secondary | ICD-10-CM

## 2019-10-11 DIAGNOSIS — Z5111 Encounter for antineoplastic chemotherapy: Secondary | ICD-10-CM | POA: Diagnosis not present

## 2019-10-11 LAB — CBC WITH DIFFERENTIAL (CANCER CENTER ONLY)
Abs Immature Granulocytes: 0 10*3/uL (ref 0.00–0.07)
Basophils Absolute: 0 10*3/uL (ref 0.0–0.1)
Basophils Relative: 0 %
Eosinophils Absolute: 0.1 10*3/uL (ref 0.0–0.5)
Eosinophils Relative: 7 %
HCT: 24.8 % — ABNORMAL LOW (ref 39.0–52.0)
Hemoglobin: 8.7 g/dL — ABNORMAL LOW (ref 13.0–17.0)
Lymphocytes Relative: 56 %
Lymphs Abs: 1.2 10*3/uL (ref 0.7–4.0)
MCH: 32.3 pg (ref 26.0–34.0)
MCHC: 35.1 g/dL (ref 30.0–36.0)
MCV: 92.2 fL (ref 80.0–100.0)
Monocytes Absolute: 0 10*3/uL — ABNORMAL LOW (ref 0.1–1.0)
Monocytes Relative: 2 %
Neutro Abs: 0.7 10*3/uL — ABNORMAL LOW (ref 1.7–7.7)
Neutrophils Relative %: 35 %
Platelet Count: 176 10*3/uL (ref 150–400)
RBC: 2.69 MIL/uL — ABNORMAL LOW (ref 4.22–5.81)
RDW: 15.2 % (ref 11.5–15.5)
WBC Count: 2.1 10*3/uL — ABNORMAL LOW (ref 4.0–10.5)
nRBC: 0 % (ref 0.0–0.2)

## 2019-10-11 LAB — CMP (CANCER CENTER ONLY)
ALT: 12 U/L (ref 0–44)
AST: 13 U/L — ABNORMAL LOW (ref 15–41)
Albumin: 4 g/dL (ref 3.5–5.0)
Alkaline Phosphatase: 49 U/L (ref 38–126)
Anion gap: 10 (ref 5–15)
BUN: 12 mg/dL (ref 8–23)
CO2: 26 mmol/L (ref 22–32)
Calcium: 8.4 mg/dL — ABNORMAL LOW (ref 8.9–10.3)
Chloride: 102 mmol/L (ref 98–111)
Creatinine: 0.99 mg/dL (ref 0.61–1.24)
GFR, Est AFR Am: >60 mL/min (ref >60)
GFR, Estimated: >60 mL/min (ref >60)
Glucose, Bld: 154 mg/dL — ABNORMAL HIGH (ref 70–99)
Potassium: 3.9 mmol/L (ref 3.5–5.1)
Sodium: 138 mmol/L (ref 135–145)
Total Bilirubin: 0.4 mg/dL (ref 0.3–1.2)
Total Protein: 6 g/dL — ABNORMAL LOW (ref 6.5–8.1)

## 2019-10-11 MED ORDER — SODIUM CHLORIDE 0.9% FLUSH
10.0000 mL | INTRAVENOUS | Status: DC | PRN
  Administered 2019-10-11: 10 mL
  Filled 2019-10-11: qty 10

## 2019-10-11 NOTE — Progress Notes (Signed)
No treatment today per Dr. Alen Blew due to [facility] count of 0.7.  RN educated patient on neutropenic precautions.

## 2019-10-11 NOTE — Patient Instructions (Signed)

## 2019-10-11 NOTE — Patient Instructions (Signed)
Neutropenia Neutropenia is a condition that occurs when you have a lower-than-normal level of a type of white blood cell (neutrophil) in your body. Neutrophils are made in the spongy center of large bones (bone marrow), and they fight infections. Neutrophils are your body's main defense against bacterial and fungal infections. The fewer neutrophils you have and the longer your body remains without them, the greater your risk of getting a severe infection. What are the causes? This condition can occur if your body uses up or destroys neutrophils faster than your bone marrow can make them. Neutropenia may be caused by:  A bacterial or fungal infection.  Allergic disorders.  Reactions to some medicines.  An autoimmune disease.  An enlarged spleen. This condition can also occur if your bone marrow does not produce enough neutrophils. This problem may be caused by:  Cancer.  Cancer treatments, such as radiation or chemotherapy.  Viral infections.  Medicines, such as phenytoin.  Vitamin B12 deficiency.  Diseases of the bone marrow.  Environmental toxins, such as insecticides. What are the signs or symptoms? This condition does not usually cause symptoms. If symptoms are present, they are usually caused by an underlying infection. Symptoms of an infection may include:  Fever.  Chills.  Swollen glands.  Oral or anal ulcers.  Cough and shortness of breath.  Rash.  Skin infection.  Fatigue. How is this diagnosed? Your health care provider may suspect neutropenia if you have:  A condition that may cause neutropenia.  Symptoms during or after treatment for cancer.  Symptoms of infection, especially fever.  Frequent and unusual infections. This condition is diagnosed based on your medical history and a physical exam. Tests will also be done, such as:  A complete blood count (CBC).  A procedure to collect a sample of bone marrow for examination (bone marrow  biopsy).  A chest X-ray.  A urine culture.  A blood culture. How is this treated? Treatment depends on the underlying cause and severity of your condition. Mild neutropenia may not require treatment. Treatment may include medicines, such as:  Antibiotic medicine given through an IV.  Antiviral medicines.  Antifungal medicines.  A medicine to increase neutrophil production (colony-stimulating factor). You may get this drug through an IV or by injection.  Steroids given through an IV. If an underlying condition is causing neutropenia, you may need treatment for that condition. If medicines or cancer treatments are causing neutropenia, your health care provider may have you stop the medicines or treatment. Follow these instructions at home: Medicines   Take over-the-counter and prescription medicines only as told by your health care provider.  Get a seasonal flu shot (influenza vaccine).  Avoid people who received a vaccine in the past 30 days if that vaccine contained a live version of the germ (live vaccine). You should not get a live vaccine. Common live vaccines are polio, MMR, chicken pox, and shingles vaccines. Eating and drinking  Do not share food utensils.  Do not eat unpasteurized foods.  Do not eat raw or undercooked meat, eggs, or seafood.  Do not eat unwashed, raw fruits or vegetables. Lifestyle  Avoid exposure to groups of people or children.  Avoid being around people who are sick.  Avoid being around dirt or dust, such as in construction areas or gardens.  Do not provide direct care for pets. Avoid animal droppings. Do not clean litter boxes and bird cages.  Do not have sex unless your health care provider has approved. Hygiene  Bathe daily.  Clean the area between the genitals and the anus (perineal area) after you urinate or have a bowel movement. If you are male, wipe from front to back.  Brush your teeth with a soft toothbrush before and  after meals.  Do not use a regular razor. Use an electric razor to remove hair.  Wash your hands often. Make sure others who come in contact with you also wash their hands. If soap and water are not available, use hand sanitizer. General instructions  Follow any precautions as told by your health care provider to reduce your risk for injury or infection.  Take actions to avoid cuts and burns. For example: ? Be cautious when you use knives. Always cut away from yourself. ? Keep knives in protective sheaths or guards when not in use. ? Use oven mitts when you cook with a hot stove, oven, or grill. ? Stand a safe distance away from open fires.  Do not use tampons, enemas, or rectal suppositories unless your health care provider has approved.  Keep all follow-up visits as told by your health care provider. This is important. Contact a health care provider if:  You have: ? A sore throat. ? A warm, red, or tender area on your skin. ? A cough. ? Frequent or painful urination. ? Vaginal discharge or itching.  You develop: ? Sores in your mouth or anus. ? Swollen lymph nodes. ? Red streaks on the skin. ? A rash. Get help right away if:  You have: ? A fever. ? Chills, or you start to shake.  You feel: ? Nauseous, or you vomit. ? Very fatigued. ? Short of breath. Summary  Neutropenia is a condition that occurs when you have a lower-than-normal level of a type of white blood cell (neutrophil) in your body.  This condition can occur if your body uses up or destroys neutrophils faster than your bone marrow can make them.  Treatment depends on the underlying cause and severity of your condition. Mild neutropenia may not require treatment.  Follow any precautions as told by your health care provider to reduce your risk for injury or infection. This information is not intended to replace advice given to you by your health care provider. Make sure you discuss any questions you have  with your health care provider. Document Revised: 06/30/2018 Document Reviewed: 06/30/2018 Elsevier Patient Education  [place].

## 2019-10-17 ENCOUNTER — Telehealth: Payer: Self-pay | Admitting: Oncology

## 2019-10-17 NOTE — Telephone Encounter (Signed)
Scheduled appt per 1/18 sch message - pt is aware of appt date and time   

## 2019-10-20 ENCOUNTER — Inpatient Hospital Stay: Payer: Medicare HMO

## 2019-10-20 ENCOUNTER — Other Ambulatory Visit: Payer: Self-pay

## 2019-10-20 VITALS — BP 134/84 | HR 58 | Temp 98.7°F | Resp 18 | Wt 218.5 lb

## 2019-10-20 DIAGNOSIS — Z95828 Presence of other vascular implants and grafts: Secondary | ICD-10-CM

## 2019-10-20 DIAGNOSIS — C679 Malignant neoplasm of bladder, unspecified: Secondary | ICD-10-CM

## 2019-10-20 DIAGNOSIS — Z5111 Encounter for antineoplastic chemotherapy: Secondary | ICD-10-CM | POA: Diagnosis not present

## 2019-10-20 LAB — CBC WITH DIFFERENTIAL (CANCER CENTER ONLY)
Abs Immature Granulocytes: 0.01 10*3/uL (ref 0.00–0.07)
Basophils Absolute: 0 10*3/uL (ref 0.0–0.1)
Basophils Relative: 0 %
Eosinophils Absolute: 0.1 10*3/uL (ref 0.0–0.5)
Eosinophils Relative: 1 %
HCT: 28.9 % — ABNORMAL LOW (ref 39.0–52.0)
Hemoglobin: 9.8 g/dL — ABNORMAL LOW (ref 13.0–17.0)
Immature Granulocytes: 0 %
Lymphocytes Relative: 32 %
Lymphs Abs: 1.8 10*3/uL (ref 0.7–4.0)
MCH: 33.1 pg (ref 26.0–34.0)
MCHC: 33.9 g/dL (ref 30.0–36.0)
MCV: 97.6 fL (ref 80.0–100.0)
Monocytes Absolute: 0.6 10*3/uL (ref 0.1–1.0)
Monocytes Relative: 11 %
Neutro Abs: 3.2 10*3/uL (ref 1.7–7.7)
Neutrophils Relative %: 56 %
Platelet Count: 128 10*3/uL — ABNORMAL LOW (ref 150–400)
RBC: 2.96 MIL/uL — ABNORMAL LOW (ref 4.22–5.81)
RDW: 18.4 % — ABNORMAL HIGH (ref 11.5–15.5)
WBC Count: 5.7 10*3/uL (ref 4.0–10.5)
nRBC: 0 % (ref 0.0–0.2)

## 2019-10-20 LAB — CMP (CANCER CENTER ONLY)
ALT: 8 U/L (ref 0–44)
AST: 12 U/L — ABNORMAL LOW (ref 15–41)
Albumin: 4.1 g/dL (ref 3.5–5.0)
Alkaline Phosphatase: 45 U/L (ref 38–126)
Anion gap: 9 (ref 5–15)
BUN: 11 mg/dL (ref 8–23)
CO2: 26 mmol/L (ref 22–32)
Calcium: 8.6 mg/dL — ABNORMAL LOW (ref 8.9–10.3)
Chloride: 108 mmol/L (ref 98–111)
Creatinine: 0.89 mg/dL (ref 0.61–1.24)
GFR, Est AFR Am: >60 mL/min (ref >60)
GFR, Estimated: >60 mL/min (ref >60)
Glucose, Bld: 85 mg/dL (ref 70–99)
Potassium: 4 mmol/L (ref 3.5–5.1)
Sodium: 143 mmol/L (ref 135–145)
Total Bilirubin: 0.4 mg/dL (ref 0.3–1.2)
Total Protein: 6 g/dL — ABNORMAL LOW (ref 6.5–8.1)

## 2019-10-20 MED ORDER — HEPARIN SOD (PORK) LOCK FLUSH 100 UNIT/ML IV SOLN
500.0000 [IU] | Freq: Once | INTRAVENOUS | Status: AC | PRN
  Administered 2019-10-20: 500 [IU]
  Filled 2019-10-20: qty 5

## 2019-10-20 MED ORDER — SODIUM CHLORIDE 0.9% FLUSH
10.0000 mL | INTRAVENOUS | Status: DC | PRN
  Administered 2019-10-20: 17:00:00 10 mL
  Filled 2019-10-20: qty 10

## 2019-10-20 MED ORDER — SODIUM CHLORIDE 0.9 % IV SOLN
800.0000 mg/m2 | Freq: Once | INTRAVENOUS | Status: AC
  Administered 2019-10-20: 16:00:00 1824 mg via INTRAVENOUS
  Filled 2019-10-20: qty 47.97

## 2019-10-20 MED ORDER — SODIUM CHLORIDE 0.9% FLUSH
10.0000 mL | INTRAVENOUS | Status: DC | PRN
  Administered 2019-10-20: 10 mL
  Filled 2019-10-20: qty 10

## 2019-10-20 MED ORDER — PROCHLORPERAZINE MALEATE 10 MG PO TABS
ORAL_TABLET | ORAL | Status: AC
  Filled 2019-10-20: qty 1

## 2019-10-20 MED ORDER — PROCHLORPERAZINE MALEATE 10 MG PO TABS
10.0000 mg | ORAL_TABLET | Freq: Once | ORAL | Status: AC
  Administered 2019-10-20: 10 mg via ORAL

## 2019-10-20 MED ORDER — SODIUM CHLORIDE 0.9 % IV SOLN
Freq: Once | INTRAVENOUS | Status: AC
  Filled 2019-10-20: qty 250

## 2019-10-20 NOTE — Patient Instructions (Signed)
[facility] Discharge Instructions for Patients Receiving Chemotherapy  Today you received the following chemotherapy agents Gemcitabine (GEMZAR).  To help prevent nausea and vomiting after your treatment, we encourage you to take your nausea medication as prescribed.   If you develop nausea and vomiting that is not controlled by your nausea medication, call the clinic.   BELOW ARE SYMPTOMS THAT SHOULD BE REPORTED IMMEDIATELY:  *FEVER GREATER THAN 100.5 F  *CHILLS WITH OR WITHOUT FEVER  NAUSEA AND VOMITING THAT IS NOT CONTROLLED WITH YOUR NAUSEA MEDICATION  *UNUSUAL SHORTNESS OF BREATH  *UNUSUAL BRUISING OR BLEEDING  TENDERNESS IN MOUTH AND THROAT WITH OR WITHOUT PRESENCE OF ULCERS  *URINARY PROBLEMS  *BOWEL PROBLEMS  UNUSUAL RASH Items with * indicate a potential emergency and should be followed up as soon as possible.  Feel free to call the clinic should you have any questions or concerns. The clinic phone number is [phone].  Please show the CHEMO ALERT CARD at check-in to the Emergency Department and triage nurse.  Coronavirus (COVID-19) Are you at risk?  Are you at risk for the Coronavirus (COVID-19)?  To be considered HIGH RISK for Coronavirus (COVID-19), you have to meet the following criteria:  . Traveled to [place], [place], [place], [place] or [place]; or in the [place] to [place], [place], [place], or [place]; and have fever, cough, and shortness of breath within the last 2 weeks of travel OR . Been in close contact with a person diagnosed with COVID-19 within the last 2 weeks and have fever, cough, and shortness of breath . IF YOU DO NOT MEET THESE CRITERIA, YOU ARE CONSIDERED LOW RISK FOR COVID-19.  What to do if you are HIGH RISK for COVID-19?  . If you are having a medical emergency, call 911. . Seek medical care right away. Before you go to a doctor's office, urgent care or emergency department, call ahead and tell them  about your recent travel, contact with someone diagnosed with COVID-19, and your symptoms. You should receive instructions from your physician's office regarding next steps of care.  . When you arrive at healthcare provider, tell the healthcare staff immediately you have returned from visiting [place], [place], [place], [place] or [place]; or traveled in the [place] to [place], [place], [place], or [place]; in the last two weeks or you have been in close contact with a person diagnosed with COVID-19 in the last 2 weeks.   . Tell the health care staff about your symptoms: fever, cough and shortness of breath. . After you have been seen by a medical provider, you will be either: o Tested for (COVID-19) and discharged home on quarantine except to seek medical care if symptoms worsen, and asked to  - Stay home and avoid contact with others until you get your results (4-5 days)  - Avoid travel on public transportation if possible (such as bus, train, or airplane) or o Sent to the Emergency Department by EMS for evaluation, COVID-19 testing, and possible admission depending on your condition and test results.  What to do if you are LOW RISK for COVID-19?  Reduce your risk of any infection by using the same precautions used for avoiding the common cold or flu:  . Wash your hands often with soap and warm water for at least 20 seconds.  If soap and water are not readily available, use an alcohol-based hand sanitizer with at least 60% alcohol.  . If coughing or   sneezing, cover your mouth and nose by coughing or sneezing into the elbow areas of your shirt or coat, into a tissue or into your sleeve (not your hands). . Avoid shaking hands with others and consider head nods or verbal greetings only. . Avoid touching your eyes, nose, or mouth with unwashed hands.  . Avoid close contact with people who are sick. . Avoid places or events with large numbers of people in one location, like concerts or  sporting events. . Carefully consider travel plans you have or are making. . If you are planning any travel outside or inside the [place], visit the CDC's Travelers' Health webpage for the latest health notices. . If you have some symptoms but not all symptoms, continue to monitor at home and seek medical attention if your symptoms worsen. . If you are having a medical emergency, call 911.   ADDITIONAL HEALTHCARE OPTIONS FOR PATIENTS  [facility] / e-Visit: [email or web address]         [facility]: [phone]  [facility]: [phone]                   [facility]: [phone]    

## 2019-10-20 NOTE — Patient Instructions (Signed)

## 2019-10-25 ENCOUNTER — Ambulatory Visit (HOSPITAL_COMMUNITY)
Admission: RE | Admit: 2019-10-25 | Discharge: 2019-10-25 | Disposition: A | Discharge status: Home / Self Care | Payer: Medicare HMO | Source: Ambulatory Visit | Attending: Oncology | Admitting: Oncology

## 2019-10-25 ENCOUNTER — Other Ambulatory Visit: Payer: Self-pay

## 2019-10-25 ENCOUNTER — Inpatient Hospital Stay: Payer: Medicare HMO

## 2019-10-25 DIAGNOSIS — C679 Malignant neoplasm of bladder, unspecified: Secondary | ICD-10-CM

## 2019-10-25 DIAGNOSIS — Z95828 Presence of other vascular implants and grafts: Secondary | ICD-10-CM

## 2019-10-25 DIAGNOSIS — Z5111 Encounter for antineoplastic chemotherapy: Secondary | ICD-10-CM | POA: Diagnosis not present

## 2019-10-25 LAB — CBC WITH DIFFERENTIAL (CANCER CENTER ONLY)
Abs Immature Granulocytes: 0 10*3/uL (ref 0.00–0.07)
Basophils Absolute: 0 10*3/uL (ref 0.0–0.1)
Basophils Relative: 0 %
Eosinophils Absolute: 0.1 10*3/uL (ref 0.0–0.5)
Eosinophils Relative: 4 %
HCT: 26.2 % — ABNORMAL LOW (ref 39.0–52.0)
Hemoglobin: 9 g/dL — ABNORMAL LOW (ref 13.0–17.0)
Immature Granulocytes: 0 %
Lymphocytes Relative: 42 %
Lymphs Abs: 1.3 10*3/uL (ref 0.7–4.0)
MCH: 33.5 pg (ref 26.0–34.0)
MCHC: 34.4 g/dL (ref 30.0–36.0)
MCV: 97.4 fL (ref 80.0–100.0)
Monocytes Absolute: 0.1 10*3/uL (ref 0.1–1.0)
Monocytes Relative: 3 %
Neutro Abs: 1.5 10*3/uL — ABNORMAL LOW (ref 1.7–7.7)
Neutrophils Relative %: 51 %
Platelet Count: 148 10*3/uL — ABNORMAL LOW (ref 150–400)
RBC: 2.69 MIL/uL — ABNORMAL LOW (ref 4.22–5.81)
RDW: 16.8 % — ABNORMAL HIGH (ref 11.5–15.5)
WBC Count: 3.1 10*3/uL — ABNORMAL LOW (ref 4.0–10.5)
nRBC: 0 % (ref 0.0–0.2)

## 2019-10-25 LAB — CMP (CANCER CENTER ONLY)
ALT: 7 U/L (ref 0–44)
AST: 14 U/L — ABNORMAL LOW (ref 15–41)
Albumin: 3.9 g/dL (ref 3.5–5.0)
Alkaline Phosphatase: 40 U/L (ref 38–126)
Anion gap: 8 (ref 5–15)
BUN: 14 mg/dL (ref 8–23)
CO2: 28 mmol/L (ref 22–32)
Calcium: 9.1 mg/dL (ref 8.9–10.3)
Chloride: 104 mmol/L (ref 98–111)
Creatinine: 0.98 mg/dL (ref 0.61–1.24)
GFR, Est AFR Am: >60 mL/min (ref >60)
GFR, Estimated: >60 mL/min (ref >60)
Glucose, Bld: 103 mg/dL — ABNORMAL HIGH (ref 70–99)
Potassium: 4.3 mmol/L (ref 3.5–5.1)
Sodium: 140 mmol/L (ref 135–145)
Total Bilirubin: 0.5 mg/dL (ref 0.3–1.2)
Total Protein: 6.3 g/dL — ABNORMAL LOW (ref 6.5–8.1)

## 2019-10-25 MED ORDER — HEPARIN SOD (PORK) LOCK FLUSH 100 UNIT/ML IV SOLN
INTRAVENOUS | Status: AC
  Administered 2019-10-25: 500 [IU] via INTRAVENOUS
  Filled 2019-10-25: qty 5

## 2019-10-25 MED ORDER — HEPARIN SOD (PORK) LOCK FLUSH 100 UNIT/ML IV SOLN: 500.0000 [IU] | Freq: Once | INTRAVENOUS | Status: AC

## 2019-10-25 MED ORDER — SODIUM CHLORIDE 0.9 % IV SOLN
INTRAVENOUS | Status: AC
  Filled 2019-10-25: qty 250

## 2019-10-25 MED ORDER — IOHEXOL 300 MG/ML  SOLN
100.0000 mL | Freq: Once | INTRAMUSCULAR | Status: AC | PRN
  Administered 2019-10-25: 11:00:00 100 mL via INTRAVENOUS

## 2019-10-25 MED ORDER — SODIUM CHLORIDE 0.9% FLUSH
10.0000 mL | INTRAVENOUS | Status: DC | PRN
  Administered 2019-10-25: 10 mL
  Filled 2019-10-25: qty 10

## 2019-10-25 MED ORDER — SODIUM CHLORIDE (PF) 0.9 % IJ SOLN
INTRAMUSCULAR | Status: AC
  Filled 2019-10-25: qty 50

## 2019-10-30 DIAGNOSIS — D49519 Neoplasm of unspecified behavior of unspecified kidney: Secondary | ICD-10-CM

## 2019-10-30 HISTORY — DX: Neoplasm of unspecified behavior of unspecified kidney: D49.519

## 2019-11-01 ENCOUNTER — Other Ambulatory Visit: Payer: Self-pay

## 2019-11-01 ENCOUNTER — Encounter: Payer: Self-pay | Admitting: Family Medicine

## 2019-11-01 ENCOUNTER — Inpatient Hospital Stay: Payer: Medicare HMO | Attending: Oncology | Admitting: Oncology

## 2019-11-01 VITALS — BP 118/72 | HR 62 | Temp 98.3°F | Resp 18 | Ht 74.0 in | Wt 216.5 lb

## 2019-11-01 DIAGNOSIS — Z79899 Other long term (current) drug therapy: Secondary | ICD-10-CM | POA: Insufficient documentation

## 2019-11-01 DIAGNOSIS — Z9221 Personal history of antineoplastic chemotherapy: Secondary | ICD-10-CM | POA: Diagnosis not present

## 2019-11-01 DIAGNOSIS — Z7982 Long term (current) use of aspirin: Secondary | ICD-10-CM | POA: Insufficient documentation

## 2019-11-01 DIAGNOSIS — C679 Malignant neoplasm of bladder, unspecified: Secondary | ICD-10-CM | POA: Diagnosis present

## 2019-11-01 NOTE — Progress Notes (Signed)
Hematology and Oncology Follow Up Visit  Brandon Haynes [id number] 03-14-1941 79 y.o. 11/01/2019 12:53 PM McGowen, Brandon Haynes, MDMcGowen, Brandon Blackwater, MD   Principle Diagnosis: 79 year old man withT2N0 high-grade urothelial carcinoma of the bladder diagnosed in August 2020.   Prior Therapy:  He is status post TURBT on 06/12/2019 which showed high grade invasive urothelial carcinoma invading into the muscularis propria.  Neoadjuvant chemotherapy utilizing cisplatin and gemcitabine.  He completed 4 cycles of chemotherapy on October 20, 2019.  Current therapy: Active surveillance and under evaluation for radical cystectomy.    Interim History: Brandon Haynes is here for return evaluation.  Since the last visit, he had completed the last cycle of chemotherapy without any complaints.  He denies any nausea, vomiting or abdominal pain.  He denies any worsening neuropathy or changes in his performance status.  He denies any residual complications related to chemotherapy occluding neuropathy or excessive fatigue.       Medications: Unchanged on review. Current Outpatient Medications  Medication Sig Dispense Refill  . acetaminophen (TYLENOL) 500 MG tablet Take 500 mg by mouth every 6 (six) hours as needed for moderate pain.    Marland Kitchen allopurinol (ZYLOPRIM) 300 MG tablet TAKE 1 TABLET BY MOUTH EVERY DAY 90 tablet 0  . amLODipine (NORVASC) 5 MG tablet TAKE 1 TABLET BY MOUTH EVERY DAY 90 tablet 1  . aspirin 81 MG tablet Take 81 mg by mouth daily.      Marland Kitchen desonide (DESOWEN) 0.05 % cream Apply 1 application topically 3 (three) times a week.   1  . diphenhydrAMINE (BENADRYL) 25 MG tablet Take 50 mg by mouth 3 (three) times daily as needed for itching or allergies.     Marland Kitchen EPINEPHrine 0.3 mg/0.3 mL IJ SOAJ injection Inject 0.3 mg into the muscle as needed for anaphylaxis.     . fluticasone (FLONASE) 50 MCG/ACT nasal spray Place 1 spray into both nostrils daily.    Marland Kitchen gabapentin (NEURONTIN) 300 MG capsule Take 300 mg  by mouth 3 (three) times daily.     Marland Kitchen loratadine (CLARITIN) 10 MG tablet Take 20 mg by mouth daily.     Marland Kitchen losartan (COZAAR) 50 MG tablet TAKE 1 TABLET BY MOUTH TWICE A DAY (Patient taking differently: Take 50 mg by mouth 2 (two) times daily. ) 60 tablet 11  . metoprolol tartrate (LOPRESSOR) 25 MG tablet Take 1 tablet (25 mg total) by mouth 2 (two) times daily. 180 tablet 3  . nitroGLYCERIN (NITROSTAT) 0.4 MG SL tablet Place 1 tablet (0.4 mg total) under the tongue every 5 (five) minutes as needed for chest pain. 25 tablet 6  . pantoprazole (PROTONIX) 40 MG tablet Take 1 tablet (40 mg total) by mouth daily. 90 tablet 3  . PREVIDENT 5000 BOOSTER PLUS 1.1 % PSTE Take 1 application by mouth at bedtime.   1  . prochlorperazine (COMPAZINE) 10 MG tablet Take 1 tablet (10 mg total) by mouth every 6 (six) hours as needed for nausea or vomiting. 30 tablet 0  . rosuvastatin (CRESTOR) 20 MG tablet TAKE 1 TABLET BY MOUTH EVERY DAY 90 tablet 1  . sildenafil (VIAGRA) 100 MG tablet Take 1 tablet (100 mg total) by mouth daily as needed for erectile dysfunction. 10 tablet 6  . tamsulosin (FLOMAX) 0.4 MG CAPS capsule Take 0.4 mg by mouth.    . traMADol (ULTRAM) 50 MG tablet Take 1 tablet (50 mg total) by mouth 3 (three) times daily as needed. for pain 90 tablet 5  No current facility-administered medications for this visit.     Allergies: No Known Allergies      Physical Exam:  Blood pressure 118/72, pulse 62, temperature 98.3 F (36.8 C), temperature source Temporal, resp. rate 18, height 6\' 2"  (1.88 m), weight 216 lb 8 oz (98.2 kg), SpO2 99 %.    ECOG: 0     General appearance: Alert, awake without any distress. Head: Atraumatic without abnormalities Oropharynx: Without any thrush or ulcers. Eyes: No scleral icterus. Lymph nodes: No lymphadenopathy noted in the cervical, supraclavicular, or axillary nodes Heart:regular rate and rhythm, without any murmurs or gallops.   Lung: Clear to  auscultation without any rhonchi, wheezes or dullness to percussion. Abdomin: Soft, nontender without any shifting dullness or ascites. Musculoskeletal: No clubbing or cyanosis. Neurological: No motor or sensory deficits. Skin: No rashes or lesions.      Lab Results: Lab Results  Component Value Date   WBC 3.1 (L) 10/25/2019   HGB 9.0 (L) 10/25/2019   HCT 26.2 (L) 10/25/2019   MCV 97.4 10/25/2019   PLT 148 (L) 10/25/2019     Chemistry      Component Value Date/Time   NA 140 10/25/2019 0924   NA 143 03/13/2019 1141   K 4.3 10/25/2019 0924   CL 104 10/25/2019 0924   CO2 28 10/25/2019 0924   BUN 14 10/25/2019 0924   BUN 11 03/13/2019 1141   CREATININE 0.98 10/25/2019 0924   CREATININE 1.03 05/04/2019 1633      Component Value Date/Time   CALCIUM 9.1 10/25/2019 0924   ALKPHOS 40 10/25/2019 0924   AST 14 (L) 10/25/2019 0924   ALT 7 10/25/2019 0924   BILITOT 0.5 10/25/2019 0924     IMPRESSION: 1. Interval resection of a previously seen mass about the posterior left aspect of the bladder. 2. No specific evidence of metastatic disease within the chest, abdomen or pelvis. No lymphadenopathy in the chest, abdomen, or pelvis. 3. Numerous tiny centrilobular pulmonary nodules, likely smoking-related respiratory bronchiolitis, and occasional small nodules, likely incidental and infectious or inflammatory. Attention on follow-up. 4. 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 Renal ultrasound could be used to assess for cystic or solid character, or alternately more complete characterization may be performed by multiphasic contrast enhanced MRI. 5. Coronary artery disease. 6. Aortic Atherosclerosis  (ICD10-I70.0). No significant change in ectasia of the infrarenal abdominal aorta measuring up to 2.5 x 2.4 cm on current examination  Impression and Plan:  79 year old man with:  1.  Bladder cancer diagnosed in August 2020.  He was found to have T2N0 high-grade urothelial carcinoma without any evidence of metastatic disease.   He completed 4 cycles of neoadjuvant chemotherapy in January 2021 without any complications.  CT scan on October 25, 2019 was personally reviewed and discussed with the patient today.  There is no evidence of metastatic disease at this time with his bladder tumor appears to have responded to therapy.  The natural course of this disease was discussed and alternative treatment options were reviewed.  I have recommended proceeding with radical cystectomy for curative purposes.  He is agreeable with this plan and is coordinating with Dr. Tresa Moore schedule surgery.  2.  IV access: Port-A-Cath will continue to be in place after his  surgery and will be flushed periodically.  We will consider Port-A-Cath removal after his surgery and recovery.  3.  Antiemetics: No residual issues with nausea or vomiting at this time.   4.  Neutropenia: Resolved after the conclusion of chemotherapy.  5.  Renal function surveillance: His creatinine clearance remains normal after completing cisplatin based therapy.  6.  Follow-up: He will return after his surgery for evaluation.  30 minutes was dedicated to this encounter.  The time was spent on reviewing his disease status, reviewing imaging studies as well as options of treatment and coordinating plan of care.     Zola Button, MD 2/3/202112:53 PM

## 2019-11-02 ENCOUNTER — Telehealth: Payer: Self-pay | Admitting: Oncology

## 2019-11-02 NOTE — Telephone Encounter (Signed)
Scheduled appt per 2/3 los.  Sent a message to HIM pool to get a calendar mailed out. 

## 2019-11-03 ENCOUNTER — Other Ambulatory Visit: Payer: Self-pay | Admitting: Urology

## 2019-11-09 ENCOUNTER — Telehealth: Payer: Self-pay | Admitting: Oncology

## 2019-11-09 NOTE — Telephone Encounter (Signed)
Cancelled appt per 2/10 sch msg. Called and spoke with pt to confirmed cancelled appt

## 2019-11-13 ENCOUNTER — Other Ambulatory Visit: Payer: Self-pay | Admitting: Cardiology

## 2019-11-13 ENCOUNTER — Other Ambulatory Visit: Payer: Self-pay | Admitting: Family Medicine

## 2019-11-13 ENCOUNTER — Encounter: Payer: Self-pay | Admitting: Family Medicine

## 2019-11-27 DIAGNOSIS — Z8546 Personal history of malignant neoplasm of prostate: Secondary | ICD-10-CM

## 2019-11-27 HISTORY — DX: Personal history of malignant neoplasm of prostate: Z85.46

## 2019-11-30 ENCOUNTER — Other Ambulatory Visit: Payer: Self-pay | Admitting: Urology

## 2019-12-05 NOTE — Patient Instructions (Signed)
DUE TO COVID-19 ONLY ONE VISITOR IS ALLOWED TO COME WITH YOU AND STAY IN THE WAITING ROOM ONLY DURING PRE OP AND PROCEDURE DAY OF SURGERY. THE 1 VISITOR MAY VISIT WITH YOU AFTER SURGERY IN YOUR PRIVATE ROOM DURING VISITING HOURS ONLY!  YOU NEED TO HAVE A COVID 19 TEST ON_Friday 03/12/2021______ @_______ , THIS TEST MUST BE DONE BEFORE SURGERY, COME  [place], [place] , [id number].  (Brandon Haynes) ONCE YOUR COVID TEST IS COMPLETED, PLEASE BEGIN THE QUARANTINE INSTRUCTIONS AS OUTLINED IN YOUR HANDOUT.                Brandon Haynes     Your procedure is scheduled on: Tuesday 12/12/2019   Report to [facility] Main  Entrance    Report to admitting at   [place] AM     Call this number if you have problems the morning of surgery [phone]               Follow the Bowel prep instructions from Dr. Tresa Moore the day before surgery on Monday 12/11/2019 and follow a clear liquid diet up until midnight!      CLEAR LIQUID DIET   Foods Allowed                                                                     Foods Excluded  Coffee and tea, regular and decaf                             liquids that you cannot  Plain Jell-O any favor except red or purple                                           see through such as: Fruit ices (not with fruit pulp)                                     milk, soups, orange juice  Iced Popsicles                                    All solid food Carbonated beverages, regular and diet                                    Cranberry, grape and apple juices Sports drinks like Gatorade Lightly seasoned clear broth or consume(fat free) Sugar, honey syrup  Sample Menu Breakfast                                Lunch                                     Supper Cranberry juice  Beef broth                            Chicken broth Jell-O                                     Grape juice                           Apple juice Coffee or  tea                        Jell-O                                      Popsicle                                                Coffee or tea                        Coffee or tea  _____________________________________________________________________        BRUSH YOUR TEETH MORNING OF SURGERY AND RINSE YOUR MOUTH OUT, NO CHEWING GUM CANDY OR MINTS.     Take these medicines the morning of surgery with A SIP OF WATER: Gabapentin (Neurontin), Metoprolol tartrate (Lopressor), use Flonase nasal spray, Allopurinol (Zyloprim), Loratadine (Claritin),  Pantoprazole (Protonix)                                 You may not have any metal on your body including hair pins and              piercings  Do not wear jewelry, make-up, lotions, powders or perfumes, deodorant                           Men may shave face and neck.   Do not bring valuables to the hospital. [place].  Contacts, dentures or bridgework may not be worn into surgery.  Leave suitcase in the car. After surgery it may be brought to your room.                  Please read over the following fact sheets you were given: _____________________________________________________________________             [facility] - Preparing for Surgery Before surgery, you can play an important role.  Because skin is not sterile, your skin needs to be as free of germs as possible.  You can reduce the number of germs on your skin by washing with CHG (chlorahexidine gluconate) soap before surgery.  CHG is an antiseptic cleaner which kills germs and bonds with the skin to continue killing germs even after washing. Please DO NOT use if you have an allergy to CHG or antibacterial soaps.  If your skin becomes reddened/irritated stop using the CHG and inform your nurse when you arrive at  Short Stay. Do not shave (including legs and underarms) for at least 48 hours prior to the first CHG shower.  You may shave  your face/neck. Please follow these instructions carefully:  1.  Shower with CHG Soap the night before surgery and the  morning of Surgery.  2.  If you choose to wash your hair, wash your hair first as usual with your  normal  shampoo.  3.  After you shampoo, rinse your hair and body thoroughly to remove the  shampoo.                           4.  Use CHG as you would any other liquid soap.  You can apply chg directly  to the skin and wash                       Gently with a scrungie or clean washcloth.  5.  Apply the CHG Soap to your body ONLY FROM THE NECK DOWN.   Do not use on face/ open                           Wound or open sores. Avoid contact with eyes, ears mouth and genitals (private parts).                       Wash face,  Genitals (private parts) with your normal soap.             6.  Wash thoroughly, paying special attention to the area where your surgery  will be performed.  7.  Thoroughly rinse your body with warm water from the neck down.  8.  DO NOT shower/wash with your normal soap after using and rinsing off  the CHG Soap.                9.  Pat yourself dry with a clean towel.            10.  Wear clean pajamas.            11.  Place clean sheets on your bed the night of your first shower and do not  sleep with pets. Day of Surgery : Do not apply any lotions/deodorants the morning of surgery.  Please wear clean clothes to the hospital/surgery center.  FAILURE TO FOLLOW THESE INSTRUCTIONS MAY RESULT IN THE CANCELLATION OF YOUR SURGERY PATIENT SIGNATURE_________________________________  NURSE SIGNATURE__________________________________  ________________________________________________________________________  WHAT IS A BLOOD TRANSFUSION? Blood Transfusion Information  A transfusion is the replacement of blood or some of its parts. Blood is made up of multiple cells which provide different functions.  Red blood cells carry oxygen and are used for blood loss  replacement.  White blood cells fight against infection.  Platelets control bleeding.  Plasma helps clot blood.  Other blood products are available for specialized needs, such as hemophilia or other clotting disorders. BEFORE THE TRANSFUSION  Who gives blood for transfusions?   Healthy volunteers who are fully evaluated to make sure their blood is safe. This is blood bank blood. Transfusion therapy is the safest it has ever been in the practice of medicine. Before blood is taken from a donor, a complete history is taken to make sure that person has no history of diseases nor engages in risky social behavior (examples are intravenous drug use or sexual activity with multiple partners). The  donor's travel history is screened to minimize risk of transmitting infections, such as malaria. The donated blood is tested for signs of infectious diseases, such as HIV and hepatitis. The blood is then tested to be sure it is compatible with you in order to minimize the chance of a transfusion reaction. If you or a relative donates blood, this is often done in anticipation of surgery and is not appropriate for emergency situations. It takes many days to process the donated blood. RISKS AND COMPLICATIONS Although transfusion therapy is very safe and saves many lives, the main dangers of transfusion include:   Getting an infectious disease.  Developing a transfusion reaction. This is an allergic reaction to something in the blood you were given. Every precaution is taken to prevent this. The decision to have a blood transfusion has been considered carefully by your caregiver before blood is given. Blood is not given unless the benefits outweigh the risks. AFTER THE TRANSFUSION  Right after receiving a blood transfusion, you will usually feel much better and more energetic. This is especially true if your red blood cells have gotten low (anemic). The transfusion raises the level of the red blood cells which  carry oxygen, and this usually causes an energy increase.  The nurse administering the transfusion will monitor you carefully for complications. HOME CARE INSTRUCTIONS  No special instructions are needed after a transfusion. You may find your energy is better. Speak with your caregiver about any limitations on activity for underlying diseases you may have. SEEK MEDICAL CARE IF:   Your condition is not improving after your transfusion.  You develop redness or irritation at the intravenous (IV) site. SEEK IMMEDIATE MEDICAL CARE IF:  Any of the following symptoms occur over the next 12 hours:  Shaking chills.  You have a temperature by mouth above 102 F (38.9 C), not controlled by medicine.  Chest, back, or muscle pain.  People around you feel you are not acting correctly or are confused.  Shortness of breath or difficulty breathing.  Dizziness and fainting.  You get a rash or develop hives.  You have a decrease in urine output.  Your urine turns a dark color or changes to pink, red, or brown. Any of the following symptoms occur over the next 10 days:  You have a temperature by mouth above 102 F (38.9 C), not controlled by medicine.  Shortness of breath.  Weakness after normal activity.  The white part of the eye turns yellow (jaundice).  You have a decrease in the amount of urine or are urinating less often.  Your urine turns a dark color or changes to pink, red, or brown. Document Released: 09/11/2000 Document Revised: 12/07/2011 Document Reviewed: 04/30/2008 [facility] Patient Information 2014 [place], [place].  _______________________________________________________________________

## 2019-12-06 ENCOUNTER — Encounter (HOSPITAL_COMMUNITY)
Admission: RE | Admit: 2019-12-06 | Discharge: 2019-12-06 | Disposition: A | Discharge status: Home / Self Care | Payer: Medicare HMO | Source: Ambulatory Visit | Attending: Urology | Admitting: Urology

## 2019-12-07 ENCOUNTER — Encounter (HOSPITAL_COMMUNITY): Payer: Medicare HMO

## 2019-12-12 ENCOUNTER — Inpatient Hospital Stay (HOSPITAL_COMMUNITY)
Admission: AD | Admit: 2019-12-12 | Discharge: 2019-12-19 | DRG: 654 | Disposition: A | Discharge status: Home Health | Payer: Medicare HMO | Attending: Urology | Admitting: Urology

## 2019-12-12 ENCOUNTER — Other Ambulatory Visit: Payer: Self-pay | Admitting: Urology

## 2019-12-12 DIAGNOSIS — D49512 Neoplasm of unspecified behavior of left kidney: Secondary | ICD-10-CM | POA: Diagnosis not present

## 2019-12-12 DIAGNOSIS — Z825 Family history of asthma and other chronic lower respiratory diseases: Secondary | ICD-10-CM | POA: Diagnosis not present

## 2019-12-12 DIAGNOSIS — Z79899 Other long term (current) drug therapy: Secondary | ICD-10-CM | POA: Diagnosis not present

## 2019-12-12 DIAGNOSIS — Z8249 Family history of ischemic heart disease and other diseases of the circulatory system: Secondary | ICD-10-CM

## 2019-12-12 DIAGNOSIS — H9190 Unspecified hearing loss, unspecified ear: Secondary | ICD-10-CM | POA: Diagnosis present

## 2019-12-12 DIAGNOSIS — Z79891 Long term (current) use of opiate analgesic: Secondary | ICD-10-CM

## 2019-12-12 DIAGNOSIS — I739 Peripheral vascular disease, unspecified: Secondary | ICD-10-CM | POA: Diagnosis present

## 2019-12-12 DIAGNOSIS — I2510 Atherosclerotic heart disease of native coronary artery without angina pectoris: Secondary | ICD-10-CM | POA: Diagnosis not present

## 2019-12-12 DIAGNOSIS — Z85828 Personal history of other malignant neoplasm of skin: Secondary | ICD-10-CM

## 2019-12-12 DIAGNOSIS — Z955 Presence of coronary angioplasty implant and graft: Secondary | ICD-10-CM

## 2019-12-12 DIAGNOSIS — M545 Low back pain: Secondary | ICD-10-CM | POA: Diagnosis not present

## 2019-12-12 DIAGNOSIS — Z803 Family history of malignant neoplasm of breast: Secondary | ICD-10-CM | POA: Diagnosis not present

## 2019-12-12 DIAGNOSIS — Z20822 Contact with and (suspected) exposure to covid-19: Secondary | ICD-10-CM | POA: Diagnosis present

## 2019-12-12 DIAGNOSIS — I10 Essential (primary) hypertension: Secondary | ICD-10-CM | POA: Diagnosis not present

## 2019-12-12 DIAGNOSIS — F17290 Nicotine dependence, other tobacco product, uncomplicated: Secondary | ICD-10-CM | POA: Diagnosis not present

## 2019-12-12 DIAGNOSIS — Z8052 Family history of malignant neoplasm of bladder: Secondary | ICD-10-CM | POA: Diagnosis not present

## 2019-12-12 DIAGNOSIS — Z7982 Long term (current) use of aspirin: Secondary | ICD-10-CM | POA: Diagnosis not present

## 2019-12-12 DIAGNOSIS — Z8551 Personal history of malignant neoplasm of bladder: Secondary | ICD-10-CM | POA: Diagnosis present

## 2019-12-12 DIAGNOSIS — M159 Polyosteoarthritis, unspecified: Secondary | ICD-10-CM | POA: Diagnosis present

## 2019-12-12 DIAGNOSIS — K567 Ileus, unspecified: Secondary | ICD-10-CM | POA: Diagnosis not present

## 2019-12-12 DIAGNOSIS — Z833 Family history of diabetes mellitus: Secondary | ICD-10-CM | POA: Diagnosis not present

## 2019-12-12 DIAGNOSIS — C679 Malignant neoplasm of bladder, unspecified: Principal | ICD-10-CM | POA: Diagnosis present

## 2019-12-12 DIAGNOSIS — Z801 Family history of malignant neoplasm of trachea, bronchus and lung: Secondary | ICD-10-CM | POA: Diagnosis not present

## 2019-12-12 LAB — SURGICAL PCR SCREEN
MRSA, PCR: NEGATIVE
Staphylococcus aureus: POSITIVE — AB

## 2019-12-12 LAB — COMPREHENSIVE METABOLIC PANEL
ALT: 13 U/L (ref 0–44)
AST: 17 U/L (ref 15–41)
Albumin: 4.3 g/dL (ref 3.5–5.0)
Alkaline Phosphatase: 45 U/L (ref 38–126)
Anion gap: 7 (ref 5–15)
BUN: 9 mg/dL (ref 8–23)
CO2: 27 mmol/L (ref 22–32)
Calcium: 9.2 mg/dL (ref 8.9–10.3)
Chloride: 107 mmol/L (ref 98–111)
Creatinine, Ser: 0.94 mg/dL (ref 0.61–1.24)
GFR calc Af Amer: >60 mL/min (ref >60)
GFR calc non Af Amer: >60 mL/min (ref >60)
Glucose, Bld: 90 mg/dL (ref 70–99)
Potassium: 3.8 mmol/L (ref 3.5–5.1)
Sodium: 141 mmol/L (ref 135–145)
Total Bilirubin: 0.8 mg/dL (ref 0.3–1.2)
Total Protein: 6.5 g/dL (ref 6.5–8.1)

## 2019-12-12 LAB — CBC
HCT: 41 % (ref 39.0–52.0)
Hemoglobin: 13.8 g/dL (ref 13.0–17.0)
MCH: 32.7 pg (ref 26.0–34.0)
MCHC: 33.7 g/dL (ref 30.0–36.0)
MCV: 97.2 fL (ref 80.0–100.0)
Platelets: 128 10*3/uL — ABNORMAL LOW (ref 150–400)
RBC: 4.22 MIL/uL (ref 4.22–5.81)
RDW: 13 % (ref 11.5–15.5)
WBC: 5.3 10*3/uL (ref 4.0–10.5)
nRBC: 0 % (ref 0.0–0.2)

## 2019-12-12 MED ORDER — MUPIROCIN 2 % EX OINT
1.0000 "application " | TOPICAL_OINTMENT | Freq: Two times a day (BID) | CUTANEOUS | Status: DC
  Administered 2019-12-12: 1 via NASAL
  Filled 2019-12-12: qty 22

## 2019-12-12 MED ORDER — ACETAMINOPHEN 325 MG PO TABS
650.0000 mg | ORAL_TABLET | ORAL | Status: DC | PRN
  Administered 2019-12-12: 650 mg via ORAL
  Filled 2019-12-12: qty 2

## 2019-12-12 MED ORDER — ROSUVASTATIN CALCIUM 20 MG PO TABS
20.0000 mg | ORAL_TABLET | Freq: Every day | ORAL | Status: DC
  Administered 2019-12-12: 20 mg via ORAL
  Filled 2019-12-12: qty 1

## 2019-12-12 MED ORDER — METOPROLOL TARTRATE 12.5 MG HALF TABLET
12.5000 mg | ORAL_TABLET | Freq: Two times a day (BID) | ORAL | Status: DC
  Administered 2019-12-12 – 2019-12-13 (×2): 12.5 mg via ORAL
  Filled 2019-12-12: qty 1
  Filled 2019-12-12: qty 0.5

## 2019-12-12 MED ORDER — ALVIMOPAN 12 MG PO CAPS: 12.0000 mg | ORAL_CAPSULE | ORAL | Status: AC

## 2019-12-12 MED ORDER — ALLOPURINOL 100 MG PO TABS
300.0000 mg | ORAL_TABLET | Freq: Every day | ORAL | Status: DC
  Administered 2019-12-12: 300 mg via ORAL
  Filled 2019-12-12: qty 1

## 2019-12-12 MED ORDER — GABAPENTIN 300 MG PO CAPS
300.0000 mg | ORAL_CAPSULE | Freq: Every day | ORAL | Status: DC
  Administered 2019-12-12: 300 mg via ORAL
  Filled 2019-12-12: qty 1

## 2019-12-12 MED ORDER — PIPERACILLIN-TAZOBACTAM 3.375 G IVPB 30 MIN
3.3750 g | INTRAVENOUS | Status: AC
  Administered 2019-12-13: 3.375 g via INTRAVENOUS
  Filled 2019-12-12 (×2): qty 50

## 2019-12-12 MED ORDER — NEOMYCIN SULFATE 500 MG PO TABS
1000.0000 mg | ORAL_TABLET | ORAL | Status: AC
  Administered 2019-12-12: 1000 mg via ORAL
  Filled 2019-12-12 (×2): qty 2

## 2019-12-12 MED ORDER — LORATADINE 10 MG PO TABS: 20.0000 mg | ORAL_TABLET | Freq: Every day | ORAL | Status: DC

## 2019-12-12 MED ORDER — SODIUM CHLORIDE 0.9% FLUSH: 10.0000 mL | INTRAVENOUS | Status: DC | PRN

## 2019-12-12 MED ORDER — CHLORHEXIDINE GLUCONATE CLOTH 2 % EX PADS
6.0000 | MEDICATED_PAD | Freq: Every day | CUTANEOUS | Status: DC
  Administered 2019-12-12: 6 via TOPICAL

## 2019-12-12 MED ORDER — FLUTICASONE PROPIONATE 50 MCG/ACT NA SUSP
1.0000 | Freq: Every day | NASAL | Status: DC
  Administered 2019-12-12: 1 via NASAL
  Filled 2019-12-12: qty 16

## 2019-12-12 MED ORDER — METRONIDAZOLE 500 MG PO TABS
500.0000 mg | ORAL_TABLET | ORAL | Status: AC
  Administered 2019-12-12 – 2019-12-13 (×2): 500 mg via ORAL
  Filled 2019-12-12 (×2): qty 1

## 2019-12-12 MED ORDER — GABAPENTIN 300 MG PO CAPS: 600.0000 mg | ORAL_CAPSULE | Freq: Every day | ORAL | Status: DC

## 2019-12-12 MED ORDER — AMLODIPINE BESYLATE 5 MG PO TABS
5.0000 mg | ORAL_TABLET | Freq: Every day | ORAL | Status: DC
  Administered 2019-12-12: 5 mg via ORAL
  Filled 2019-12-12: qty 1

## 2019-12-12 MED ORDER — SODIUM CHLORIDE 0.9 % IV SOLN: INTRAVENOUS | Status: DC

## 2019-12-12 MED ORDER — PEG 3350-KCL-NA BICARB-NACL 420 G PO SOLR
4000.0000 mL | Freq: Once | ORAL | Status: AC
  Administered 2019-12-12: 4000 mL via ORAL

## 2019-12-12 MED ORDER — PANTOPRAZOLE SODIUM 40 MG PO TBEC
40.0000 mg | DELAYED_RELEASE_TABLET | Freq: Every day | ORAL | Status: DC
  Administered 2019-12-12: 40 mg via ORAL
  Filled 2019-12-12: qty 1

## 2019-12-12 NOTE — H&P (Signed)
Brandon Haynes is an 79 y.o. male.    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 Gloriann Loan. Staging CT clinically localized. Has had opinion from medical oncology as well as rad-onc. Completed 4 cycles of gem-cis . He desires curative intent path with chemo / cystectomy. Restating imaging 09/2019 w/o progression.   2 - Small LEFT Renal Neoplasm - <2cm very subtle left mid mass 90% endophytic by CT 04/2019 and stable 10/2019. No avid enhancement / mass effect.   PMH sig for CAD/Stent (follows [facility], not limiting, ASA only), HTN. No chest / abd surgeries. His PCP is Crissie Sickles MD.   Today " Brandon Haynes " is seen as pre-op admission for labs, stomal marking, bowel prep prior to planned cystoprostatectomy tomorrow. No interval fevers.      Past Medical History:  Diagnosis Date  . Allergic rhinitis    allergy shots via [place]  . Anxiety   . Bladder cancer ([place]) 05/2019   INVASIVE HIGH GRADE PAPILLARY UROTHELIAL CARCINOMA, invading muscularis propia.  Lesion resected. No mets. Pt declined radical cystectomy surgery, and elected for chemo + radiation as of 05/2019 onc eval. He then changed his mind and chose radic cystect->surg eval pending as of 10/2019. He got neoadjuvant chemo x 4 cycles.  . 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 CAD, multiple vessel    a. CP/near-syncope 11/2014 s/p DES to MLAD, occluded RCA after takeoff of RV marginal with collaterals treated medically, otherwise mild nonobstructive disease. EF 55%;  b. 07/2015 Lexiscan CL: EF 56%, no ischemia/infarct.   . Cataracts, both eyes   . Chronic low back pain   . DDD (degenerative disc disease), lumbar   . GERD (gastroesophageal reflux disease)    PPI bid needed to control sx's  . Gout   . Hearing loss   . History  of basal cell carcinoma   . History of adenomatous polyp of colon    Rpt colonoscopy recommended  02/2020->no further if no high risk lesions at that time.  . Hypercholesteremia   . Hypertension    Hx labile/uncontrolled.  Improved s/p visit to HTN clinic 2019.  . Iron deficiency anemia 2017   Colonoscopy, EGD, and capsule endoscopy w/out obvious cause/source found.  Oral iron helping as of 03/2016.  . Leg pain 03/06/2016  . Nonmelanoma skin cancer   . 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 Peripheral vascular disease ([place])   . Pleural thickening    chronic  . 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 No avid enhancement or mass effect. Obs by urol as of 10/2019.    Past Surgical History:  Procedure Laterality Date  . back injection  oct 2011, 07/2010, 12/2012   no surgery  . Capsule endoscopy  02/2016   mild duodenitis, o/w NEG  . CARDIOVASCULAR STRESS TEST  05/2009; 07/2015  . carotid dopplers  11/2014   Bilateral - 1% to 9% ICA stenosis. Vertebral artery flow is  . COLONOSCOPY  08/2008; 02/2016; 02/2017   2017: 12 polyps (adenomatous).  02/2017 repeat TCS--multiple polyps: recall 3 yrs.  . CORONARY ANGIOPLASTY WITH STENT PLACEMENT  12/04/2014   4.0 x 12 mm Synergy stent to the mid LAD  . CYSTOSCOPY  05/24/2019  . CYSTOSCOPY W/ TRANSURETHRAL RESECTION OF POSTERIOR URETHERAL VALVES  06/12/2019   2-5cm  . ESOPHAGOGASTRODUODENOSCOPY  02/2016   Gastritis.  NEG h pylori.    . IR IMAGING GUIDED PORT INSERTION  09/12/2019  . LE arterial vascular study  02/2016   Per Dr. Radford Pax, no evidence of peripheral vascular disease  . LEFT HEART CATHETERIZATION WITH CORONARY ANGIOGRAM N/A 12/04/2014   Procedure: LEFT HEART CATHETERIZATION WITH CORONARY ANGIOGRAM;  Surgeon: Jolaine Artist, MD; LAD 99% then aneurysmal, OM1 40%, CFX 50%, RCA 40%, 50% then occluded  . ROTATOR CUFF REPAIR Left 09/2013   left  . SHOULDER INJECTION  feb 2014  . TRANSTHORACIC ECHOCARDIOGRAM  11/2014   EF 50-55%, some wall motion abnormalities noted, grade I diast  dysfxn  . TRANSURETHRAL RESECTION OF BLADDER TUMOR N/A 06/12/2019   INVASIVE HIGH GRADE PAPILLARY UROTHELIAL CARCINOMA, invading muscularis propria.  Procedure: TRANSURETHRAL RESECTION OF BLADDER TUMOR (TURBT);  Surgeon: Lucas Mallow, MD;  Location: [facility];  Service: Urology;  Laterality: N/A;    Family History  Problem Relation Age of Onset  . Lung cancer Father   . Heart disease Father   . Breast cancer Mother   . Congestive Heart Failure Mother   . Diabetes Mother   . Breast cancer Sister   . Heart disease Brother   . COPD Brother   . Bladder Cancer Maternal Grandfather   . Colon cancer Neg Hx   . Esophageal cancer Neg Hx   . Pancreatic cancer Neg Hx   . Rectal cancer Neg Hx   . Stomach cancer Neg Hx   . Prostate cancer Neg Hx    Social History:  reports that he quit smoking about 21 years ago. His smoking use included cigarettes. He has a 70.00 pack-year smoking history. He quit smokeless tobacco use about 33 years ago.  His smokeless tobacco use included snuff. He reports that he does not drink alcohol or use drugs.  Allergies: No Known Allergies  Medications Prior to Admission  Medication Sig Dispense Refill  . acetaminophen (TYLENOL) 500 MG tablet Take 500 mg by mouth every 6 (six) hours as needed for moderate pain.    Marland Kitchen allopurinol (ZYLOPRIM) 300 MG tablet TAKE 1 TABLET BY MOUTH EVERY DAY (Patient taking differently: Take 300 mg by mouth at bedtime. ) 90 tablet 0  . amLODipine (NORVASC) 5 MG tablet TAKE 1 TABLET BY MOUTH EVERY DAY (Patient taking differently: Take 5 mg by mouth daily at 6 PM. ) 90 tablet 1  . aspirin EC 81 MG tablet Take 81 mg by mouth daily.    Marland Kitchen desonide (DESOWEN) 0.05 % cream Apply 1 application topically 3 (three) times a week.   1  . diphenhydrAMINE (BENADRYL) 25 MG tablet Take 50 mg by mouth 3 (three) times daily as needed for itching or allergies.     Marland Kitchen EPINEPHrine 0.3 mg/0.3 mL IJ SOAJ injection Inject 0.3 mg into the muscle as needed for  anaphylaxis.     . fluticasone (FLONASE) 50 MCG/ACT nasal spray Place 1 spray into both nostrils daily.    Marland Kitchen gabapentin (NEURONTIN) 300 MG capsule Take 300-600 mg by mouth See admin instructions. Take 2 capsules (600 mg) by mouth in the morning & take 1 capsule (300 mg) by mouth in the afternoon.    . loratadine (CLARITIN) 10 MG tablet Take 20 mg by mouth daily.     Marland Kitchen losartan (COZAAR) 50 MG tablet Take 1 tablet (50 mg total) by mouth 2 (two) times daily. Please make yearly appt with Dr. Radford Pax for June for future refills. 1st attempt  180 tablet 0  . metoprolol tartrate (LOPRESSOR) 25 MG tablet Take 1 tablet (25 mg total) by mouth 2 (two) times daily. 180 tablet 3  . nitroGLYCERIN (NITROSTAT) 0.4 MG SL tablet Place 1 tablet (0.4 mg total) under the tongue every 5 (five) minutes as needed for chest pain. 25 tablet 6  . pantoprazole (PROTONIX) 40 MG tablet Take 1 tablet (40 mg total) by mouth daily. 90 tablet 3  . PREVIDENT 5000 BOOSTER PLUS 1.1 % PSTE Take 1 application by mouth at bedtime.   1  . rosuvastatin (CRESTOR) 20 MG tablet TAKE 1 TABLET BY MOUTH EVERY DAY (Patient taking differently: Take 20 mg by mouth every evening. ) 90 tablet 1  . traMADol (ULTRAM) 50 MG tablet Take 1 tablet (50 mg total) by mouth 3 (three) times daily as needed. for pain 90 tablet 5    No results found for this or any previous visit (from the past 48 hour(s)). No results found.  Review of Systems  Constitutional: Negative.   HENT: Negative.   Eyes: Negative.   Respiratory: Negative.   Cardiovascular: Negative.   Gastrointestinal: Negative.   Endocrine: Negative.   Genitourinary: Negative.   Musculoskeletal: Negative.   Allergic/Immunologic: Negative.   Neurological: Negative.   Hematological: Negative.   Psychiatric/Behavioral: Negative.     Blood pressure 126/83, pulse (!) 53. Physical Exam  Constitutional: He appears well-developed.  HENT:  Head: Normocephalic.  Eyes: Pupils are equal, round, and  reactive to light.  Cardiovascular: Normal rate.  Respiratory: Effort normal.  GI: Soft.  RLQ stomal matking site noted.   Genitourinary:    Genitourinary Comments: NO CVAT.    Musculoskeletal:        General: Normal range of motion.     Cervical back: Normal range of motion.  Skin: Skin is warm.  Psychiatric: He has a normal mood and affect.     Assessment/Plan  CMP, CBC, T+C 2 units, NS at 50, home meds, consent, clears, bowel prep, entered in preparation for major extirpative surgery tomorrow. Risks, benefits, alternatives, expected peri-op course discussed in detail and reiterated today.   [name], MD 12/12/2019, 5:49 PM

## 2019-12-12 NOTE — Progress Notes (Signed)
Patient arrived to room 1417.  Dr. Tresa Moore paged.

## 2019-12-12 NOTE — Progress Notes (Signed)
Dr. Tresa Moore returned page  - gave order to access Port-a-cath and release signed and held orders.  Dr. Tresa Moore to see patient this evening.

## 2019-12-12 NOTE — Consult Note (Addendum)
[facility] Nurse requested for preoperative stoma site marking. Discussed surgical procedure and stoma creation with patient and wife.  Explained role of the [facility] nurse team. Provided the patient with educational booklet and provided samples of pouching options. Answered patient's questions.   Examined patient sitting, and standing in order to place the marking in the patient's visual field, away from any creases or abdominal contour issues and within the rectus muscle.  Attempted to mark below the patient's belt line, but this was not possible related to a significant crease which occurs when the patient is sitting or leaning over and should be avoided if possible.   Marked for ileal conduit in the RLQ _5___cm to the right of the umbilicus and  0000000 cm above the umbilicus.  Patient's abdomen cleansed with CHG wipes at site markings, allowed to air dry prior to marking. Pt plans to have surgery performed tomorrow.  [facility] Nurse team will follow up with patient after surgery for continued ostomy care and teaching.  Julien Girt MSN, RN, [place], [place], [place]

## 2019-12-13 ENCOUNTER — Encounter (HOSPITAL_COMMUNITY)
Admission: AD | Disposition: A | Discharge status: Home Health | Payer: Self-pay | Source: Home / Self Care | Attending: Urology

## 2019-12-13 ENCOUNTER — Inpatient Hospital Stay (HOSPITAL_COMMUNITY): Payer: Medicare HMO | Admitting: Anesthesiology

## 2019-12-13 ENCOUNTER — Encounter (HOSPITAL_COMMUNITY): Payer: Self-pay | Admitting: Urology

## 2019-12-13 ENCOUNTER — Other Ambulatory Visit: Payer: Self-pay

## 2019-12-13 DIAGNOSIS — C679 Malignant neoplasm of bladder, unspecified: Secondary | ICD-10-CM | POA: Diagnosis present

## 2019-12-13 DIAGNOSIS — Z8551 Personal history of malignant neoplasm of bladder: Secondary | ICD-10-CM | POA: Diagnosis present

## 2019-12-13 HISTORY — PX: ROBOT ASSISTED LAPAROSCOPIC COMPLETE CYSTECT ILEAL CONDUIT: SHX5139

## 2019-12-13 HISTORY — PX: CYSTOSCOPY WITH INJECTION: SHX1424

## 2019-12-13 HISTORY — PX: LYMPHADENECTOMY: SHX5960

## 2019-12-13 LAB — RESPIRATORY PANEL BY RT PCR (FLU A&B, COVID)
Influenza A by PCR: NEGATIVE
Influenza B by PCR: NEGATIVE
SARS Coronavirus 2 by RT PCR: NEGATIVE

## 2019-12-13 LAB — HEMOGLOBIN AND HEMATOCRIT, BLOOD
HCT: 34.6 % — ABNORMAL LOW (ref 39.0–52.0)
Hemoglobin: 12 g/dL — ABNORMAL LOW (ref 13.0–17.0)

## 2019-12-13 LAB — BASIC METABOLIC PANEL
Anion gap: 8 (ref 5–15)
BUN: 11 mg/dL (ref 8–23)
CO2: 24 mmol/L (ref 22–32)
Calcium: 8.5 mg/dL — ABNORMAL LOW (ref 8.9–10.3)
Chloride: 108 mmol/L (ref 98–111)
Creatinine, Ser: 1.02 mg/dL (ref 0.61–1.24)
GFR calc Af Amer: >60 mL/min (ref >60)
GFR calc non Af Amer: >60 mL/min (ref >60)
Glucose, Bld: 203 mg/dL — ABNORMAL HIGH (ref 70–99)
Potassium: 3.6 mmol/L (ref 3.5–5.1)
Sodium: 140 mmol/L (ref 135–145)

## 2019-12-13 LAB — PREPARE RBC (CROSSMATCH)

## 2019-12-13 LAB — ABO/RH: ABO/RH(D): A NEG

## 2019-12-13 SURGERY — CYSTECTOMY, ROBOT-ASSISTED, WITH ILEAL CONDUIT CREATION
Anesthesia: General

## 2019-12-13 MED ORDER — ROCURONIUM BROMIDE 10 MG/ML (PF) SYRINGE
PREFILLED_SYRINGE | INTRAVENOUS | Status: AC
  Filled 2019-12-13: qty 10

## 2019-12-13 MED ORDER — SUFENTANIL CITRATE 50 MCG/ML IV SOLN
INTRAVENOUS | Status: AC
  Filled 2019-12-13: qty 1

## 2019-12-13 MED ORDER — SODIUM CHLORIDE (PF) 0.9 % IJ SOLN
INTRAMUSCULAR | Status: DC | PRN
  Administered 2019-12-13: 20 mL

## 2019-12-13 MED ORDER — PHENYLEPHRINE HCL-NACL 10-0.9 MG/250ML-% IV SOLN
INTRAVENOUS | Status: DC | PRN
  Administered 2019-12-13: 20 ug/min via INTRAVENOUS

## 2019-12-13 MED ORDER — DEXTROSE-NACL 5-0.45 % IV SOLN: INTRAVENOUS | Status: DC

## 2019-12-13 MED ORDER — STERILE WATER FOR IRRIGATION IR SOLN
Status: DC | PRN
  Administered 2019-12-13: 1000 mL

## 2019-12-13 MED ORDER — ACETAMINOPHEN 10 MG/ML IV SOLN: 1000.0000 mg | Freq: Once | INTRAVENOUS | Status: DC | PRN

## 2019-12-13 MED ORDER — LACTATED RINGERS IR SOLN
Status: DC | PRN
  Administered 2019-12-13: 1000 mL

## 2019-12-13 MED ORDER — PIPERACILLIN-TAZOBACTAM 3.375 G IVPB
3.3750 g | Freq: Three times a day (TID) | INTRAVENOUS | Status: AC
  Administered 2019-12-13 – 2019-12-14 (×3): 3.375 g via INTRAVENOUS
  Filled 2019-12-13 (×3): qty 50

## 2019-12-13 MED ORDER — PHENYLEPHRINE HCL (PRESSORS) 10 MG/ML IV SOLN
INTRAVENOUS | Status: AC
  Filled 2019-12-13: qty 1

## 2019-12-13 MED ORDER — WATER FOR IRRIGATION, STERILE IR SOLN
Status: DC | PRN
  Administered 2019-12-13: 3000 mL

## 2019-12-13 MED ORDER — HYDROMORPHONE HCL 1 MG/ML IJ SOLN
0.5000 mg | INTRAMUSCULAR | Status: DC | PRN
  Administered 2019-12-13 – 2019-12-18 (×18): 0.5 mg via INTRAVENOUS
  Filled 2019-12-13: qty 1
  Filled 2019-12-13 (×3): qty 0.5
  Filled 2019-12-13: qty 1
  Filled 2019-12-13 (×9): qty 0.5
  Filled 2019-12-13: qty 1
  Filled 2019-12-13 (×4): qty 0.5
  Filled 2019-12-13: qty 1

## 2019-12-13 MED ORDER — MIDAZOLAM HCL 2 MG/2ML IJ SOLN
INTRAMUSCULAR | Status: DC | PRN
  Administered 2019-12-13: 2 mg via INTRAVENOUS

## 2019-12-13 MED ORDER — KETAMINE HCL 10 MG/ML IJ SOLN
INTRAMUSCULAR | Status: DC | PRN
  Administered 2019-12-13 (×2): 10 mg via INTRAVENOUS
  Administered 2019-12-13: 20 mg via INTRAVENOUS
  Administered 2019-12-13: 10 mg via INTRAVENOUS

## 2019-12-13 MED ORDER — DOCUSATE SODIUM 100 MG PO CAPS
100.0000 mg | ORAL_CAPSULE | Freq: Two times a day (BID) | ORAL | Status: DC
  Administered 2019-12-13 – 2019-12-18 (×4): 100 mg via ORAL
  Filled 2019-12-13 (×7): qty 1

## 2019-12-13 MED ORDER — MIDAZOLAM HCL 2 MG/2ML IJ SOLN
INTRAMUSCULAR | Status: AC
  Filled 2019-12-13: qty 2

## 2019-12-13 MED ORDER — OXYCODONE HCL 5 MG PO TABS
5.0000 mg | ORAL_TABLET | ORAL | Status: DC | PRN
  Administered 2019-12-13: 5 mg via ORAL
  Administered 2019-12-13 – 2019-12-16 (×7): 10 mg via ORAL
  Administered 2019-12-16 – 2019-12-18 (×6): 5 mg via ORAL
  Administered 2019-12-18 – 2019-12-19 (×2): 10 mg via ORAL
  Administered 2019-12-19: 5 mg via ORAL
  Filled 2019-12-13: qty 2
  Filled 2019-12-13 (×2): qty 1
  Filled 2019-12-13 (×4): qty 2
  Filled 2019-12-13 (×2): qty 1
  Filled 2019-12-13 (×3): qty 2
  Filled 2019-12-13: qty 1
  Filled 2019-12-13: qty 2
  Filled 2019-12-13: qty 1
  Filled 2019-12-13: qty 2

## 2019-12-13 MED ORDER — PROPOFOL 10 MG/ML IV BOLUS
INTRAVENOUS | Status: DC | PRN
  Administered 2019-12-13: 100 mg via INTRAVENOUS

## 2019-12-13 MED ORDER — ONDANSETRON HCL 4 MG/2ML IJ SOLN
INTRAMUSCULAR | Status: AC
  Filled 2019-12-13: qty 2

## 2019-12-13 MED ORDER — DEXAMETHASONE SODIUM PHOSPHATE 10 MG/ML IJ SOLN
INTRAMUSCULAR | Status: DC | PRN
  Administered 2019-12-13: 10 mg via INTRAVENOUS

## 2019-12-13 MED ORDER — LIDOCAINE 2% (20 MG/ML) 5 ML SYRINGE
INTRAMUSCULAR | Status: AC
  Filled 2019-12-13: qty 5

## 2019-12-13 MED ORDER — SODIUM CHLORIDE (PF) 0.9 % IJ SOLN
INTRAMUSCULAR | Status: AC
  Filled 2019-12-13: qty 20

## 2019-12-13 MED ORDER — OXYCODONE HCL 5 MG PO TABS: 5.0000 mg | ORAL_TABLET | Freq: Four times a day (QID) | ORAL | Status: DC | PRN

## 2019-12-13 MED ORDER — ACETAMINOPHEN 500 MG PO TABS
1000.0000 mg | ORAL_TABLET | Freq: Four times a day (QID) | ORAL | Status: DC
  Administered 2019-12-13 – 2019-12-19 (×23): 1000 mg via ORAL
  Filled 2019-12-13 (×22): qty 2

## 2019-12-13 MED ORDER — CHLORHEXIDINE GLUCONATE CLOTH 2 % EX PADS
6.0000 | MEDICATED_PAD | Freq: Every day | CUTANEOUS | Status: DC
  Administered 2019-12-13 – 2019-12-19 (×7): 6 via TOPICAL

## 2019-12-13 MED ORDER — ONDANSETRON HCL 4 MG/2ML IJ SOLN
INTRAMUSCULAR | Status: DC | PRN
  Administered 2019-12-13: 4 mg via INTRAVENOUS

## 2019-12-13 MED ORDER — LIDOCAINE 2% (20 MG/ML) 5 ML SYRINGE
INTRAMUSCULAR | Status: DC | PRN
  Administered 2019-12-13: 100 mg via INTRAVENOUS

## 2019-12-13 MED ORDER — SUFENTANIL CITRATE 50 MCG/ML IV SOLN
INTRAVENOUS | Status: DC | PRN
  Administered 2019-12-13: 10 ug via INTRAVENOUS
  Administered 2019-12-13: 15 ug via INTRAVENOUS
  Administered 2019-12-13 (×2): 5 ug via INTRAVENOUS
  Administered 2019-12-13 (×2): 10 ug via INTRAVENOUS
  Administered 2019-12-13: 5 ug via INTRAVENOUS
  Administered 2019-12-13: 10 ug via INTRAVENOUS
  Administered 2019-12-13 (×2): 5 ug via INTRAVENOUS
  Administered 2019-12-13: 10 ug via INTRAVENOUS
  Administered 2019-12-13 (×2): 5 ug via INTRAVENOUS

## 2019-12-13 MED ORDER — HYDROMORPHONE HCL 1 MG/ML IJ SOLN
INTRAMUSCULAR | Status: AC
  Filled 2019-12-13: qty 1

## 2019-12-13 MED ORDER — DEXAMETHASONE SODIUM PHOSPHATE 10 MG/ML IJ SOLN
INTRAMUSCULAR | Status: AC
  Filled 2019-12-13: qty 1

## 2019-12-13 MED ORDER — LIDOCAINE HCL 2 % IJ SOLN
INTRAMUSCULAR | Status: AC
  Filled 2019-12-13: qty 20

## 2019-12-13 MED ORDER — ONDANSETRON HCL 4 MG/2ML IJ SOLN: 4.0000 mg | Freq: Once | INTRAMUSCULAR | Status: DC | PRN

## 2019-12-13 MED ORDER — ROCURONIUM BROMIDE 10 MG/ML (PF) SYRINGE
PREFILLED_SYRINGE | INTRAVENOUS | Status: DC | PRN
  Administered 2019-12-13: 10 mg via INTRAVENOUS
  Administered 2019-12-13: 30 mg via INTRAVENOUS
  Administered 2019-12-13: 70 mg via INTRAVENOUS
  Administered 2019-12-13 (×4): 30 mg via INTRAVENOUS

## 2019-12-13 MED ORDER — ATROPINE SULFATE 0.4 MG/ML IV SOSY
PREFILLED_SYRINGE | INTRAVENOUS | Status: DC | PRN
  Administered 2019-12-13: .4 mg via INTRAVENOUS

## 2019-12-13 MED ORDER — PROPOFOL 10 MG/ML IV BOLUS
INTRAVENOUS | Status: AC
  Filled 2019-12-13: qty 20

## 2019-12-13 MED ORDER — ALBUMIN HUMAN 5 % IV SOLN: INTRAVENOUS | Status: DC | PRN

## 2019-12-13 MED ORDER — ALVIMOPAN 12 MG PO CAPS
12.0000 mg | ORAL_CAPSULE | Freq: Once | ORAL | Status: AC
  Administered 2019-12-13: 12 mg via ORAL
  Filled 2019-12-13: qty 1

## 2019-12-13 MED ORDER — ONDANSETRON HCL 4 MG/2ML IJ SOLN
4.0000 mg | INTRAMUSCULAR | Status: DC | PRN
  Administered 2019-12-15: 4 mg via INTRAVENOUS
  Filled 2019-12-13: qty 2

## 2019-12-13 MED ORDER — SENNA 8.6 MG PO TABS
1.0000 | ORAL_TABLET | Freq: Two times a day (BID) | ORAL | Status: DC
  Administered 2019-12-13 – 2019-12-16 (×3): 8.6 mg via ORAL
  Filled 2019-12-13 (×6): qty 1

## 2019-12-13 MED ORDER — HYDROMORPHONE HCL 1 MG/ML IJ SOLN: 0.2500 mg | INTRAMUSCULAR | Status: DC | PRN

## 2019-12-13 MED ORDER — SUGAMMADEX SODIUM 200 MG/2ML IV SOLN
INTRAVENOUS | Status: DC | PRN
  Administered 2019-12-13: 200 mg via INTRAVENOUS

## 2019-12-13 MED ORDER — SODIUM CHLORIDE (PF) 0.9 % IJ SOLN
INTRAMUSCULAR | Status: AC
  Filled 2019-12-13: qty 10

## 2019-12-13 MED ORDER — LIDOCAINE 2% (20 MG/ML) 5 ML SYRINGE
INTRAMUSCULAR | Status: DC | PRN
  Administered 2019-12-13: 1 mg/kg/h via INTRAVENOUS

## 2019-12-13 MED ORDER — ALBUMIN HUMAN 5 % IV SOLN
INTRAVENOUS | Status: AC
  Filled 2019-12-13: qty 250

## 2019-12-13 MED ORDER — KETAMINE HCL 10 MG/ML IJ SOLN
INTRAMUSCULAR | Status: AC
  Filled 2019-12-13: qty 1

## 2019-12-13 MED ORDER — LACTATED RINGERS IV SOLN: INTRAVENOUS | Status: DC

## 2019-12-13 MED ORDER — BUPIVACAINE LIPOSOME 1.3 % IJ SUSP
20.0000 mL | Freq: Once | INTRAMUSCULAR | Status: AC
  Administered 2019-12-13: 266 mg
  Administered 2019-12-13: 20 mL
  Filled 2019-12-13: qty 20

## 2019-12-13 SURGICAL SUPPLY — 111 items
ADH SKN CLS APL DERMABOND .7 (GAUZE/BANDAGES/DRESSINGS) ×4
AGENT HMST KT MTR STRL THRMB (HEMOSTASIS)
APL ESCP 34 STRL LF DISP (HEMOSTASIS)
APL PRP STRL LF DISP 70% ISPRP (MISCELLANEOUS) ×2
APL SKNCLS STERI-STRIP NONHPOA (GAUZE/BANDAGES/DRESSINGS) ×2
APL SWBSTK 6 STRL LF DISP (MISCELLANEOUS) ×2
APPLICATOR COTTON TIP 6 STRL (MISCELLANEOUS) ×2 IMPLANT
APPLICATOR COTTON TIP 6IN STRL (MISCELLANEOUS) ×3
APPLICATOR SURGIFLO ENDO (HEMOSTASIS) IMPLANT
BAG LAPAROSCOPIC 12 15 PORT 16 (BASKET) ×2 IMPLANT
BAG RETRIEVAL 12/15 (BASKET) ×3
BAG URO CATCHER STRL LF (MISCELLANEOUS) ×2 IMPLANT
BENZOIN TINCTURE PRP APPL 2/3 (GAUZE/BANDAGES/DRESSINGS) ×1 IMPLANT
BLADE SURG SZ10 CARB STEEL (BLADE) IMPLANT
CATH SILICONE 5CC 18FR (INSTRUMENTS) ×3 IMPLANT
CELLS DAT CNTRL 66122 CELL SVR (MISCELLANEOUS) ×2 IMPLANT
CHLORAPREP W/TINT 26 (MISCELLANEOUS) ×3 IMPLANT
CLIP VESOLOCK LG 6/CT PURPLE (CLIP) ×6 IMPLANT
CLIP VESOLOCK MED LG 6/CT (CLIP) ×3 IMPLANT
CLIP VESOLOCK XL 6/CT (CLIP) ×4 IMPLANT
CLOTH BEACON ORANGE TIMEOUT ST (SAFETY) ×3 IMPLANT
CNTNR URN SCR LID CUP LEK RST (MISCELLANEOUS) ×2 IMPLANT
CONT SPEC 4OZ STRL OR WHT (MISCELLANEOUS) ×3
COVER SURGICAL LIGHT HANDLE (MISCELLANEOUS) ×3 IMPLANT
COVER TIP SHEARS 8 DVNC (MISCELLANEOUS) ×2 IMPLANT
COVER TIP SHEARS 8MM DA VINCI (MISCELLANEOUS) ×3
COVER WAND RF STERILE (DRAPES) IMPLANT
DECANTER SPIKE VIAL GLASS SM (MISCELLANEOUS) ×3 IMPLANT
DERMABOND ADVANCED (GAUZE/BANDAGES/DRESSINGS) ×2
DERMABOND ADVANCED .7 DNX12 (GAUZE/BANDAGES/DRESSINGS) ×4 IMPLANT
DRAIN CHANNEL RND F F (WOUND CARE) IMPLANT
DRAIN PENROSE 0.5X18 (DRAIN) IMPLANT
DRAPE ARM DVNC X/XI (DISPOSABLE) ×8 IMPLANT
DRAPE COLUMN DVNC XI (DISPOSABLE) ×2 IMPLANT
DRAPE DA VINCI XI ARM (DISPOSABLE) ×12
DRAPE DA VINCI XI COLUMN (DISPOSABLE) ×3
DRAPE SHEET LG 3/4 BI-LAMINATE (DRAPES) ×1 IMPLANT
DRSG TEGADERM 4X4.75 (GAUZE/BANDAGES/DRESSINGS) ×1 IMPLANT
ELECT REM PT RETURN 15FT ADLT (MISCELLANEOUS) ×3 IMPLANT
GAUZE 4X4 16PLY RFD (DISPOSABLE) IMPLANT
GLOVE BIO SURGEON STRL SZ 6.5 (GLOVE) ×4 IMPLANT
GLOVE BIOGEL M STRL SZ7.5 (GLOVE) ×9 IMPLANT
GLOVE BIOGEL PI IND STRL 7.5 (GLOVE) ×2 IMPLANT
GLOVE BIOGEL PI INDICATOR 7.5 (GLOVE) ×1
GOWN STRL REUS W/TWL LRG LVL3 (GOWN DISPOSABLE) ×15 IMPLANT
IRRIG SUCT STRYKERFLOW 2 WTIP (MISCELLANEOUS) ×3
IRRIGATION SUCT STRKRFLW 2 WTP (MISCELLANEOUS) ×2 IMPLANT
KIT PROCEDURE DA VINCI SI (MISCELLANEOUS) ×3
KIT PROCEDURE DVNC SI (MISCELLANEOUS) IMPLANT
KIT TURNOVER KIT A (KITS) IMPLANT
LOOP VESSEL MAXI BLUE (MISCELLANEOUS) ×3 IMPLANT
MANIFOLD NEPTUNE II (INSTRUMENTS) ×3 IMPLANT
NDL ASPIRATION 22 (NEEDLE) ×2 IMPLANT
NDL INSUFFLATION 14GA 120MM (NEEDLE) ×2 IMPLANT
NEEDLE ASPIRATION 22 (NEEDLE) ×3 IMPLANT
NEEDLE INSUFFLATION 14GA 120MM (NEEDLE) ×3 IMPLANT
PACK CYSTO (CUSTOM PROCEDURE TRAY) ×2 IMPLANT
PACK ROBOT UROLOGY CUSTOM (CUSTOM PROCEDURE TRAY) ×3 IMPLANT
PAD POSITIONING PINK XL (MISCELLANEOUS) ×3 IMPLANT
PENCIL SMOKE EVACUATOR (MISCELLANEOUS) IMPLANT
PORT ACCESS TROCAR AIRSEAL 12 (TROCAR) ×2 IMPLANT
PORT ACCESS TROCAR AIRSEAL 5M (TROCAR) ×1
RELOAD STAPLE 60 2.6 WHT THN (STAPLE) ×6 IMPLANT
RELOAD STAPLE 60 4.1 GRN THCK (STAPLE) ×6 IMPLANT
RELOAD STAPLER GREEN 60MM (STAPLE) ×10 IMPLANT
RELOAD STAPLER WHITE 60MM (STAPLE) ×18 IMPLANT
RETRACTOR LONRSTAR 16.6X16.6CM (MISCELLANEOUS) IMPLANT
RETRACTOR STAY HOOK 5MM (MISCELLANEOUS) IMPLANT
RETRACTOR STER APS 16.6X16.6CM (MISCELLANEOUS)
RETRACTOR WND ALEXIS 18 MED (MISCELLANEOUS) ×2 IMPLANT
RTRCTR WOUND ALEXIS 18CM MED (MISCELLANEOUS) ×3
SEAL CANN UNIV 5-8 DVNC XI (MISCELLANEOUS) ×8 IMPLANT
SEAL XI 5MM-8MM UNIVERSAL (MISCELLANEOUS) ×12
SET TRI-LUMEN FLTR TB AIRSEAL (TUBING) ×3 IMPLANT
SOLUTION ELECTROLUBE (MISCELLANEOUS) ×3 IMPLANT
SPONGE LAP 18X18 RF (DISPOSABLE) ×6 IMPLANT
SPONGE LAP 4X18 RFD (DISPOSABLE) ×3 IMPLANT
STAPLER ECHELON LONG 60 440 (INSTRUMENTS) ×3 IMPLANT
STAPLER RELOAD GREEN 60MM (STAPLE) ×15
STAPLER RELOAD WHITE 60MM (STAPLE) ×27
STENT SET URETHERAL LEFT 7FR (STENTS) ×3 IMPLANT
STENT SET URETHERAL RIGHT 7FR (STENTS) ×3 IMPLANT
SURGIFLO W/THROMBIN 8M KIT (HEMOSTASIS) IMPLANT
SUT CHROMIC 4 0 RB 1X27 (SUTURE) ×3 IMPLANT
SUT ETHILON 3 0 PS 1 (SUTURE) ×3 IMPLANT
SUT MNCRL AB 4-0 PS2 18 (SUTURE) ×6 IMPLANT
SUT PDS AB 0 CTX 36 PDP370T (SUTURE) ×10 IMPLANT
SUT SILK 3 0 SH 30 (SUTURE) IMPLANT
SUT SILK 3 0 SH CR/8 (SUTURE) ×3 IMPLANT
SUT V-LOC BARB 180 2/0GR6 GS22 (SUTURE)
SUT VIC AB 2-0 CT1 27 (SUTURE) ×3
SUT VIC AB 2-0 CT1 27XBRD (SUTURE) ×2 IMPLANT
SUT VIC AB 2-0 SH 18 (SUTURE) IMPLANT
SUT VIC AB 2-0 UR5 27 (SUTURE) ×12 IMPLANT
SUT VIC AB 3-0 SH 27 (SUTURE) ×21
SUT VIC AB 3-0 SH 27X BRD (SUTURE) ×4 IMPLANT
SUT VIC AB 3-0 SH 27XBRD (SUTURE) ×8 IMPLANT
SUT VIC AB 4-0 RB1 27 (SUTURE) ×12
SUT VIC AB 4-0 RB1 27XBRD (SUTURE) ×8 IMPLANT
SUT VLOC BARB 180 ABS3/0GR12 (SUTURE) ×3
SUTURE V-LC BRB 180 2/0GR6GS22 (SUTURE) IMPLANT
SUTURE VLOC BRB 180 ABS3/0GR12 (SUTURE) ×2 IMPLANT
SYR CONTROL 10ML LL (SYRINGE) IMPLANT
SYSTEM UROSTOMY GENTLE TOUCH (WOUND CARE) ×3 IMPLANT
TOWEL OR NON WOVEN STRL DISP B (DISPOSABLE) ×3 IMPLANT
TROCAR BLADELESS 15MM (ENDOMECHANICALS) ×3 IMPLANT
TROCAR XCEL NON-BLD 5MMX100MML (ENDOMECHANICALS) IMPLANT
TUBING CONNECTING 10 (TUBING) IMPLANT
WATER STERILE IRR 1000ML POUR (IV SOLUTION) ×6 IMPLANT
WATER STERILE IRR 3000ML UROMA (IV SOLUTION) ×3 IMPLANT
YANKAUER SUCT BULB TIP 10FT TU (MISCELLANEOUS) ×3 IMPLANT

## 2019-12-13 NOTE — Anesthesia Postprocedure Evaluation (Signed)
Anesthesia Post Note  Patient: Brandon Haynes  Procedure(s) Performed: XI ROBOTIC ASSISTED LAPAROSCOPIC COMPLETE CYSTECT ILEAL CONDUIT AND RADICAL PROSTATECTOMY (N/A ) LYMPHADENECTOMY (Bilateral ) CYSTOSCOPY WITH INJECTION (N/A )     Patient location during evaluation: PACU Anesthesia Type: General Level of consciousness: awake and alert Pain management: pain level controlled Vital Signs Assessment: post-procedure vital signs reviewed and stable Respiratory status: spontaneous breathing, nonlabored ventilation, respiratory function stable and patient connected to nasal cannula oxygen Cardiovascular status: blood pressure returned to baseline and stable Postop Assessment: no apparent nausea or vomiting Anesthetic complications: no    Last Vitals:  Vitals:   12/13/19 1700 12/13/19 1756  BP: 126/80   Pulse: (!) 102   Resp: 17   Temp:  (!) 39.4 C  SpO2: 94%     Last Pain:  Vitals:   12/13/19 1756  TempSrc: Oral  PainSc:                  Monzerrath Mcburney S

## 2019-12-13 NOTE — Anesthesia Preprocedure Evaluation (Signed)
Anesthesia Evaluation  Patient identified by MRN, date of birth, ID band Patient awake    Reviewed: Allergy & Precautions, NPO status , Patient's Chart, lab work & pertinent test results  Airway Mallampati: II  TM Distance: >3 FB Neck ROM: Full    Dental no notable dental hx.    Pulmonary neg pulmonary ROS, former smoker,    Pulmonary exam normal breath sounds clear to auscultation       Cardiovascular hypertension, + CAD and + Cardiac Stents  Normal cardiovascular exam Rhythm:Regular Rate:Normal     Neuro/Psych negative neurological ROS  negative psych ROS   GI/Hepatic Neg liver ROS, GERD  ,  Endo/Other  negative endocrine ROS  Renal/GU Renal InsufficiencyRenal disease  negative genitourinary   Musculoskeletal negative musculoskeletal ROS (+)   Abdominal   Peds negative pediatric ROS (+)  Hematology negative hematology ROS (+)   Anesthesia Other Findings   Reproductive/Obstetrics negative OB ROS                             Anesthesia Physical Anesthesia Plan  ASA: II  Anesthesia Plan: General   Post-op Pain Management:    Induction: Intravenous  PONV Risk Score and Plan: 2 and Ondansetron, Dexamethasone and Treatment may vary due to age or medical condition  Airway Management Planned: Oral ETT  Additional Equipment: Arterial line  Intra-op Plan:   Post-operative Plan: Extubation in OR  Informed Consent: I have reviewed the patients History and Physical, chart, labs and discussed the procedure including the risks, benefits and alternatives for the proposed anesthesia with the patient or authorized representative who has indicated his/her understanding and acceptance.     Dental advisory given  Plan Discussed with: CRNA and Surgeon  Anesthesia Plan Comments:         Anesthesia Quick Evaluation

## 2019-12-13 NOTE — Transfer of Care (Signed)
Immediate Anesthesia Transfer of Care Note  Patient: Brandon Haynes  Procedure(s) Performed: XI ROBOTIC ASSISTED LAPAROSCOPIC COMPLETE CYSTECT ILEAL CONDUIT AND RADICAL PROSTATECTOMY (N/A ) LYMPHADENECTOMY (Bilateral ) CYSTOSCOPY WITH INJECTION (N/A )  Patient Location: PACU  Anesthesia Type:General  Level of Consciousness: drowsy  Airway & Oxygen Therapy: Patient Spontanous Breathing and Patient connected to face mask oxygen  Post-op Assessment: Report given to RN and Post -op Vital signs reviewed and stable  Post vital signs: Reviewed and stable  Last Vitals:  Vitals Value Taken Time  BP 118/83 12/13/19 1530  Temp    Pulse 97 12/13/19 1531  Resp 13 12/13/19 1531  SpO2 100 % 12/13/19 1531  Vitals shown include unvalidated device data.  Last Pain:  Vitals:   12/13/19 0719  TempSrc: Oral  PainSc:          Complications: No apparent anesthesia complications

## 2019-12-13 NOTE — Progress Notes (Signed)
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 Gloriann Loan. Staging CT clinically localized. Has had opinion from medical oncology as well as rad-onc. Completed 4 cycles of gem-cis . He desires curative intent path with chemo / cystectomy. Restating imaging 09/2019 w/o progression.    Today " Brandon Haynes " is ready for major extirpative surgery. Hgb 13, Cr <1, Completed bowel prep to clear.     Objective: Vital signs in last 24 hours: Temp:  [97.5 F (36.4 C)-98.2 F (36.8 C)] 97.8 F (36.6 C) (03/17 0719) Pulse Rate:  [53-84] 76 (03/17 0719) Resp:  [18] 18 (03/17 0719) BP: (126-163)/(83-107) 139/90 (03/17 0719) SpO2:  [96 %-98 %] 98 % (03/17 0719) Weight:  [98.2 kg] 98.2 kg (03/17 0616) Last BM Date: 12/10/19  Intake/Output from previous day: No intake/output data recorded. Intake/Output this shift: No intake/output data recorded.  Physical Exam  Constitutional: He appears well-developed. AOx 3 in pre-op holding. CRNA at bedside.  HENT:  Head: Normocephalic.  Eyes: Pupils are equal, round, and reactive to light.  Cardiovascular: Normal rate.  Respiratory: Effort normal.  GI: Soft.  RLQ stomal matking site noted.   Genitourinary:    Genitourinary Comments: NO CVAT.    Musculoskeletal:        General: Normal range of motion.     Cervical back: Normal range of motion.  Skin: Skin is warm.  Psychiatric: He has a normal mood and affect.      Lab Results:  Recent Labs    12/12/19 1734  WBC 5.3  HGB 13.8  HCT 41.0  PLT 128*   BMET Recent Labs    12/12/19 1734  NA 141  K 3.8  CL 107  CO2 27  GLUCOSE 90  BUN 9  CREATININE 0.94  CALCIUM 9.2   PT/INR No results for input(s): LABPROT, INR in the last 72 hours. ABG No results for input(s): PHART, HCO3 in the last 72 hours.  Invalid input(s): PCO2, PO2  Studies/Results: No results found.  Anti-infectives: Anti-infectives  (From admission, onward)   Start     Dose/Rate Route Frequency Ordered Stop   12/13/19 0730  piperacillin-tazobactam (ZOSYN) IVPB 3.375 g     3.375 g 100 mL/hr over 30 Minutes Intravenous 30 min pre-op 12/12/19 1655     12/12/19 1800  neomycin (MYCIFRADIN) tablet 1,000 mg     1,000 mg Oral Every 4 hours 12/12/19 1749 12/12/19 2359   12/12/19 1800  metroNIDAZOLE (FLAGYL) tablet 500 mg     500 mg Oral Every 4 hours 12/12/19 1749 12/13/19 0020      Assessment/Plan:  Proceed as planned with cysto/ICG, robotic cystoprostatectomy + node dissection + conduit diversion. Risks, benefits, alternatives, expected peri-op course discussed previously and reiterated today.   [name] 12/13/2019

## 2019-12-13 NOTE — Brief Op Note (Signed)
12/13/2019  3:07 PM  PATIENT:  Brandon Haynes  79 y.o. male  PRE-OPERATIVE DIAGNOSIS:  BLADDER CANCER  POST-OPERATIVE DIAGNOSIS:  BLADDER CANCER  PROCEDURE:  Procedure(s) with comments: XI ROBOTIC ASSISTED LAPAROSCOPIC COMPLETE CYSTECT ILEAL CONDUIT AND RADICAL PROSTATECTOMY (N/A) - 6 HRS LYMPHADENECTOMY (Bilateral) CYSTOSCOPY WITH INJECTION (N/A)  SURGEON:  Surgeon(s) and Role:    * [name], MD - Primary  PHYSICIAN ASSISTANT:   ASSISTANTS: Kerrie Pleasure MD   ANESTHESIA:   local and general  EBL:  450 mL   BLOOD ADMINISTERED:none  DRAINS: 1- JP to bulb; 2 - RLW Urotoy with Rt (red) and Lt (blue) Bander stents   LOCAL MEDICATIONS USED:  MARCAINE     SPECIMEN:  Source of Specimen:  1 - ureteral margins; 2- pelvic lymph nodes  DISPOSITION OF SPECIMEN:  PATHOLOGY  COUNTS:  YES  TOURNIQUET:  * No tourniquets in log *  DICTATION: .Other Dictation: Dictation Number  [id number]  PLAN OF CARE: Admit to inpatient   PATIENT DISPOSITION:  PACU - hemodynamically stable.   Delay start of Pharmacological VTE agent (>24hrs) due to surgical blood loss or risk of bleeding: yes

## 2019-12-13 NOTE — Anesthesia Procedure Notes (Signed)
Arterial Line Insertion Start/End3/17/2021 8:00 AM, 12/13/2019 8:05 AM Performed by: Sharlette Dense, CRNA, CRNA  Preanesthetic checklist: patient identified, IV checked, site marked, risks and benefits discussed, surgical consent, monitors and equipment checked, pre-op evaluation, timeout performed and anesthesia consent Lidocaine 1% used for infiltration Right, radial was placed Catheter size: 20 G Hand hygiene performed  and maximum sterile barriers used  Allen's test indicative of satisfactory collateral circulation Attempts: 1 Procedure performed without using ultrasound guided technique. Following insertion, dressing applied and Biopatch. Post procedure assessment: normal and unchanged  Patient tolerated the procedure well with no immediate complications.

## 2019-12-13 NOTE — Anesthesia Procedure Notes (Signed)
Procedure Name: Intubation Date/Time: 12/13/2019 8:36 AM Performed by: Sharlette Dense, CRNA Patient Re-evaluated:Patient Re-evaluated prior to induction Oxygen Delivery Method: Circle system utilized Preoxygenation: Pre-oxygenation with 100% oxygen Induction Type: IV induction Ventilation: Mask ventilation without difficulty and Oral airway inserted - appropriate to patient size Laryngoscope Size: Miller and 3 Grade View: Grade I Tube type: Oral Tube size: 8.0 mm Number of attempts: 1 Airway Equipment and Method: Stylet Placement Confirmation: ETT inserted through vocal cords under direct vision,  positive ETCO2 and breath sounds checked- equal and bilateral Secured at: 22 cm Tube secured with: Tape Dental Injury: Teeth and Oropharynx as per pre-operative assessment

## 2019-12-13 NOTE — Progress Notes (Signed)
Dr Alinda Money notified of drainage output from left JP at 475 cc sanguinous since surgery.  Will continue to monitor closely.  Dr. Alinda Money also aware of pts back pain.  Kpad ordered and will continue to give PRNs as needed

## 2019-12-14 LAB — HEMOGLOBIN AND HEMATOCRIT, BLOOD
HCT: 33.7 % — ABNORMAL LOW (ref 39.0–52.0)
Hemoglobin: 11.7 g/dL — ABNORMAL LOW (ref 13.0–17.0)

## 2019-12-14 LAB — BASIC METABOLIC PANEL
Anion gap: 10 (ref 5–15)
BUN: 13 mg/dL (ref 8–23)
CO2: 25 mmol/L (ref 22–32)
Calcium: 8.6 mg/dL — ABNORMAL LOW (ref 8.9–10.3)
Chloride: 105 mmol/L (ref 98–111)
Creatinine, Ser: 0.9 mg/dL (ref 0.61–1.24)
GFR calc Af Amer: >60 mL/min (ref >60)
GFR calc non Af Amer: >60 mL/min (ref >60)
Glucose, Bld: 172 mg/dL — ABNORMAL HIGH (ref 70–99)
Potassium: 3.5 mmol/L (ref 3.5–5.1)
Sodium: 140 mmol/L (ref 135–145)

## 2019-12-14 MED ORDER — SODIUM CHLORIDE 0.9% FLUSH
10.0000 mL | Freq: Two times a day (BID) | INTRAVENOUS | Status: DC
  Administered 2019-12-16 (×2): 10 mL

## 2019-12-14 MED ORDER — ALVIMOPAN 12 MG PO CAPS
12.0000 mg | ORAL_CAPSULE | Freq: Two times a day (BID) | ORAL | Status: DC
  Administered 2019-12-14: 12 mg via ORAL
  Filled 2019-12-14: qty 1

## 2019-12-14 MED ORDER — ORAL CARE MOUTH RINSE
15.0000 mL | Freq: Two times a day (BID) | OROMUCOSAL | Status: DC
  Administered 2019-12-14 – 2019-12-19 (×8): 15 mL via OROMUCOSAL

## 2019-12-14 MED ORDER — KETOROLAC TROMETHAMINE 15 MG/ML IJ SOLN
15.0000 mg | Freq: Three times a day (TID) | INTRAMUSCULAR | Status: AC
  Administered 2019-12-14 – 2019-12-18 (×15): 15 mg via INTRAVENOUS
  Filled 2019-12-14 (×15): qty 1

## 2019-12-14 MED ORDER — SODIUM CHLORIDE 0.9% FLUSH
10.0000 mL | INTRAVENOUS | Status: DC | PRN
  Administered 2019-12-15: 10 mL

## 2019-12-14 NOTE — Progress Notes (Signed)
1 Day Post-Op Subjective: Primarily c/o baseline back pain from degenerative disc disease. Minimal abdominal pain. Stood at side of bed but has not walked. No nausea/emesis. Mildly tachycardic in setting of pain, other vitals reassuring.   Objective: Vital signs in last 24 hours: Temp:  [97.5 F (36.4 C)-99.2 F (37.3 C)] 98.2 F (36.8 C) (03/18 0332) Pulse Rate:  [92-106] 92 (03/18 0700) Resp:  [7-21] 10 (03/18 0700) BP: (116-145)/(69-92) 133/92 (03/18 0700) SpO2:  [92 %-99 %] 96 % (03/18 0700) Arterial Line BP: (139-160)/(41-69) 159/41 (03/17 1600)  Intake/Output from previous day: 03/17 0701 - 03/18 0700 In: 5575.1 [P.O.:100; I.V.:5101; IV Piggyback:374.2] Out: 3405 [Urine:2100; Drains:855; Blood:450] Intake/Output this shift: No intake/output data recorded.  Physical Exam:  General: Alert and oriented CV: Mild tachycardia Lungs: Clear Abdomen: Soft, appropriately tender. Drain w serosang output. Ostomy with two bander stents present. Urine light cherry, clear without clots.  Incisions: c/d/i Ext: NT, No erythema  Lab Results: Recent Labs    12/12/19 1734 12/13/19 1521 12/14/19 0451  HGB 13.8 12.0* 11.7*  HCT 41.0 34.6* 33.7*   BMET Recent Labs    12/13/19 1521 12/14/19 0451  NA 140 140  K 3.6 3.5  CL 108 105  CO2 24 25  GLUCOSE 203* 172*  BUN 11 13  CREATININE 1.02 0.90  CALCIUM 8.5* 8.6*     Studies/Results: No results found.  Assessment/Plan: 79 yo M s/p RC/IC on 12/13/2019 for bladder cancer. Progressing appropriately.  - Continue current pain regimen; add scheduled Toradol - Advance diet to clear liquid - Wean O2, encourage IS - PT/OT, ambulate qid - Transfer to floor status   LOS: 2 days   Haskel Schroeder 12/14/2019, 7:25 AM

## 2019-12-14 NOTE — Evaluation (Signed)
++Physical Therapy Evaluation Patient Details Name: Brandon Haynes MRN: [id number] DOB: [birth year] Today's Date: 12/14/2019   History of Present Illness  79 yo admitted 3/17/21with  High-grade muscle invasive bladder cancer.  SP   LAPAROSCOPIC COMPLETE CYSTECT ILEAL CONDUIT ,RADICAL PROSTATECTOMY  Clinical Impression  The patient tolerated ambulating 120' using Rw, +2 for equipment, safety. Patient should progress to return to independent ambulation. Pt admitted with above diagnosis.  Pt currently with functional limitations due to the deficits listed below (see PT Problem List). Pt will benefit from skilled PT to increase their independence and safety with mobility to allow discharge to the venue listed below.       Follow Up Recommendations Home health PT- may not need    Equipment Recommendations  Rolling walker with 5" wheels - may not need   Recommendations for Other Services       Precautions / Restrictions Precautions Precaution Comments: left JP, ilieostomy Restrictions Weight Bearing Restrictions: No      Mobility  Bed Mobility               General bed mobility comments: standing up with nursing  Transfers Overall transfer level: Needs assistance Equipment used: Rolling walker (2 wheeled) Transfers: Sit to/from Stand;Stand Pivot Transfers Sit to Stand: +2 safety/equipment;+2 physical assistance;Mod assist;Min assist Stand pivot transfers: Mod assist;+2 physical assistance;+2 safety/equipment       General transfer comment: from bed to recliner, 2 HHA to take a few steps. cues to push from recliner with mod assist from low chhair and min assist from chair built up w/ pillows.  Ambulation/Gait Ambulation/Gait assistance: Min assist;+2 safety/equipment Gait Distance (Feet): 30 Feet(then120) Assistive device: Rolling walker (2 wheeled) Gait Pattern/deviations: Step-through pattern;Decreased stride length Gait velocity: decr   General Gait Details: gait  slow, improved with distance  Stairs            Wheelchair Mobility    Modified Rankin (Stroke Patients Only)       Balance Overall balance assessment: Needs assistance Sitting-balance support: No upper extremity supported;Feet supported Sitting balance-Leahy Scale: Good     Standing balance support: Bilateral upper extremity supported;During functional activity Standing balance-Leahy Scale: Fair Standing balance comment: currently needs UE support                             Pertinent Vitals/Pain Pain Assessment: Faces Faces Pain Scale: Hurts even more Pain Location: abdomen when standing up,periarea pressure sitting Pain Descriptors / Indicators: Pressure Pain Intervention(s): Monitored during session;Premedicated before session;Repositioned    Home Living Family/patient expects to be discharged to:: Private residence Living Arrangements: Spouse/significant other Available Help at Discharge: Available 24 hours/day Type of Home: House Home Access: Stairs to enter Entrance Stairs-Rails: None Entrance Stairs-Number of Steps: 2 Home Layout: One level Home Equipment: None      Prior Function Level of Independence: Independent               Hand Dominance   Dominant Hand: Right    Extremity/Trunk Assessment        Lower Extremity Assessment Lower Extremity Assessment: Generalized weakness    Cervical / Trunk Assessment Cervical / Trunk Assessment: Normal  Communication   Communication: No difficulties  Cognition Arousal/Alertness: Awake/alert Behavior During Therapy: WFL for tasks assessed/performed Overall Cognitive Status: Within Functional Limits for tasks assessed  General Comments      Exercises     Assessment/Plan    PT Assessment Patient needs continued PT services  PT Problem List Decreased strength;Decreased mobility;Decreased knowledge of use of DME;Decreased  activity tolerance;Decreased safety awareness;Pain       PT Treatment Interventions DME instruction;Functional mobility training;Patient/family education;Gait training;Therapeutic activities;Therapeutic exercise    PT Goals (Current goals can be found in the Care Plan section)  Acute Rehab PT Goals Patient Stated Goal: to walk, no pain PT Goal Formulation: With patient Time For Goal Achievement: 12/28/19 Potential to Achieve Goals: Good    Frequency Min 3X/week   Barriers to discharge        Co-evaluation               AM-PAC PT "6 Clicks" Mobility  Outcome Measure Help needed turning from your back to your side while in a flat bed without using bedrails?: A Lot Help needed moving from lying on your back to sitting on the side of a flat bed without using bedrails?: A Lot Help needed moving to and from a bed to a chair (including a wheelchair)?: A Little Help needed standing up from a chair using your arms (e.g., wheelchair or bedside chair)?: A Little Help needed to walk in hospital room?: A Little Help needed climbing 3-5 steps with a railing? : A Lot 6 Click Score: 15    End of Session   Activity Tolerance: Patient tolerated treatment well Patient left: in chair;with call bell/phone within reach Nurse Communication: Mobility status PT Visit Diagnosis: Unsteadiness on feet (R26.81);Difficulty in walking, not elsewhere classified (R26.2);Pain    Time: 0815-0850 PT Time Calculation (min) (ACUTE ONLY): 35 min   Charges:   PT Evaluation $PT Eval Moderate Complexity: Green Pager [phone] Office [phone]   Claretha Cooper 12/14/2019, 9:09 AM

## 2019-12-14 NOTE — Progress Notes (Signed)
Pt resting quietly after dilaudid given

## 2019-12-14 NOTE — Progress Notes (Signed)
Pt c/o continuous back pain from his degenerative disc disease.  Pt repositioned multiple times, PRN pain med given.  K pad on with little relief.  Dilaudid given.

## 2019-12-14 NOTE — Progress Notes (Signed)
Pt cont to have sanguinous drainage from JP drain.  Pt remains hemodynamically stable, abdomen soft.  Sending morning labs.

## 2019-12-14 NOTE — Progress Notes (Signed)
Occupational Therapy Evaluation  Clinical Impression Patient reports living in home with spouse. Pt reports being Independent with all self-care tasks and functional mobility. Overall, pt requires set-up assist to Moderate assist for self-care tasks and Moderate A for functional mobility using RW. Pt c/o testicular swelling, lower lumbar, and abdominal pain during mobility. Pt requires a few rest breaks during mobilization, but able to complete simple ADL tasks in sitting position. Pt's O2 stats were in the 90s on room air with no LOB episodes. Pt will benefit from continued skilled acute OT services to decrease level of assist with self-care tasks upon d/c.    12/14/19 0956  OT Visit Information  Assistance Needed +2  PT/OT/SLP Co-Evaluation/Treatment Yes  Reason for Co-Treatment For patient/therapist safety;To address functional/ADL transfers  PT goals addressed during session Mobility/safety with mobility;Balance;Proper use of DME  OT goals addressed during session ADL's and self-care  History of Present Illness 79 yo admitted 3/17/21with  High-grade muscle invasive bladder cancer.  SP   LAPAROSCOPIC COMPLETE CYSTECT ILEAL CONDUIT ,RADICAL PROSTATECTOMY  Precautions  Precautions Fall  Precaution Comments left JP, ilieostomy  Restrictions  Weight Bearing Restrictions No  Home Living  Family/patient expects to be discharged to: Private residence  Living Arrangements Spouse/significant other  Available Help at Discharge Available 24 hours/day  Type of [place] to enter  Entrance Stairs-Number of Steps 2  Entrance Stairs-Rails None  Home Layout One level  Bathroom Shower/Tub Tub/shower unit  Automotive engineer None  Prior Function  Level of Independence Independent  Communication  Communication No difficulties  Pain Assessment  Pain Assessment 0-10  Pain Score 6  Faces Pain Scale 6  Pain Location abdomen when standing up,periarea  pressure sitting  Pain Descriptors / Indicators Pressure  Pain Intervention(s) Limited activity within patient's tolerance  Cognition  Arousal/Alertness Awake/alert  Behavior During Therapy WFL for tasks assessed/performed  Overall Cognitive Status Within Functional Limits for tasks assessed  Upper Extremity Assessment  Upper Extremity Assessment RUE deficits/detail;LUE deficits/detail  RUE  (Grossly 4-/5, AROM WFL)  LUE  (Grossly 4-/5, AROM WFL)  Lower Extremity Assessment  Lower Extremity Assessment Defer to PT evaluation  ADL  Overall ADL's  Needs assistance/impaired  Eating/Feeding Set up  Grooming Oral care;Wash/dry face;Wash/dry hands;Set up;Sitting  Upper Body Bathing Set up  Lower Body Bathing Moderate assistance  Upper Body Dressing  Set up  Lower Body Dressing Moderate assistance  Toilet Transfer Moderate assistance  Toileting- Clothing Manipulation and Hygiene Minimal assistance  Tub/ Shower Transfer Moderate assistance  Functional mobility during ADLs +2 for safety/equipment;+2 for physical assistance;Rolling walker;Cueing for safety;Moderate assistance  Vision- History  Baseline Vision/History Wears glasses  Wears Glasses At all times  Bed Mobility  General bed mobility comments pt sitting in recliner upon arrival for eval   Transfers  Equipment used Rolling walker (2 wheeled)  Sit to Stand +2 safety/equipment;+2 physical assistance;Mod assist;Min assist  Stand pivot transfers Mod assist;+2 physical assistance;+2 safety/equipment  Balance  Sitting balance-Leahy Scale Good  Standing balance support Bilateral upper extremity supported;During functional activity  Standing balance-Leahy Scale Fair  OT - End of Session  Equipment Utilized During Treatment Gait belt;Rolling walker  Activity Tolerance Patient limited by pain  Patient left in chair;with call bell/phone within reach  Nurse Communication Mobility status  OT Assessment  OT Recommendation/Assessment  Patient needs continued OT Services  OT Visit Diagnosis Muscle weakness (generalized) (M62.81);Unsteadiness on feet (R26.81)  OT Problem List Decreased strength;Decreased activity tolerance;Impaired  balance (sitting and/or standing);Decreased knowledge of use of DME or AE;Decreased safety awareness  OT Plan  OT Frequency (ACUTE ONLY) Min 2X/week  OT Treatment/Interventions (ACUTE ONLY) Self-care/ADL training;Therapeutic exercise;Energy conservation;DME and/or AE instruction;Therapeutic activities;Patient/family education;Balance training  AM-PAC OT "6 Clicks" Daily Activity Outcome Measure (Version 2)  Help from another person eating meals? 3  Help from another person taking care of personal grooming? 3  Help from another person toileting, which includes using toliet, bedpan, or urinal? 3  Help from another person bathing (including washing, rinsing, drying)? 2  Help from another person to put on and taking off regular upper body clothing? 3  Help from another person to put on and taking off regular lower body clothing? 2  6 Click Score 16  OT Recommendation  Follow Up Recommendations No OT follow up  OT Equipment 3 in 1 bedside commode  Individuals Consulted  Consulted and Agree with Results and Recommendations Patient  Acute Rehab OT Goals  Patient Stated Goal to be able to put on my underwear  OT Goal Formulation With patient  Time For Goal Achievement 12/28/19  Potential to Achieve Goals Good  OT Time Calculation  OT Start Time (ACUTE ONLY) 0825  OT Stop Time (ACUTE ONLY) 0848  OT Time Calculation (min) 23 min  OT General Charges  $OT Visit 1 Visit  OT Evaluation  $OT Eval Moderate Complexity 1 Mod  Written Expression  Dominant Hand Right   Brandon Haynes OTR/L

## 2019-12-14 NOTE — Consult Note (Addendum)
[facility] Nurse ostomy consult note Pt had urostomy surgery performed on 3/17.  Pouch change demonstration performed with patient, daughter, and wife at the bedside to ask questions and watch the procedure. Current pouch is leaking behind the barrier.  Stoma type/location: Stoma is red and viable, 1 1/2 inches, red and viable, above skin level. 2 stints in place. Peristomal assessment: intact skin surrounding Output: mod amt blood-tinged urine  Ostomy pouching: 1pc. Flat pouch applied with barrier ring to attempt to maintain a seal.  Pt and wife were able to open and close spout to empty and attach and remove bedside drainage bag.  Discussed pouching routines and ordering supplies. Educational materials left in the room and 5 extra sets of barrier rings and pouches at the bedside.  Enrolled patient in [place] program: Yes [place] team will continue to follow while the patient is in the hospital.  Julien Girt MSN, RN, [place], [place], [place]

## 2019-12-14 NOTE — Op Note (Signed)
NAME: Brandon Haynes, Brandon Haynes MEDICAL RECORD [id number] ACCOUNT [id number] DATE OF BIRTH:[birth year] F[facility] LOCATION: [facility] PHYSICIAN:Reni Hausner Tresa Moore, MD  OPERATIVE REPORT  DATE OF PROCEDURE:  12/13/2019  PREOPERATIVE DIAGNOSIS:  High-grade muscle invasive bladder cancer.  PROCEDURE: 1.  Cystoscopy with injection of indocyanine green dye. 2.  Robotic-assisted laparoscopic radical cystectomy. 3.  Robotic-assisted laparoscopic radical prostatectomy. 4.  Pelvic lymph node dissection. 5.  Ileal conduit urinary diversion.  ESTIMATED BLOOD LOSS:  400 mL.  COMPLICATIONS:  None.  SPECIMENS: 1.  Right distal ureteral margin frozen section negative. 2.  Right final ureteral margin. 3.  Left distal ureteral margin frozen section negative. 4.  Left final ureteral margin. 5.  Left external iliac lymph nodes. 6.  Left obturator lymph nodes. 7.  Left common iliac lymph nodes. 8.  Left internal iliac lymph nodes. 9.  Right external iliac lymph nodes. 10.  Right obturator lymph nodes. 11.  Right internal iliac lymph nodes. 12.  Right common iliac lymph nodes. 13.  Aortic bifurcation lymph nodes. 14.  Bladder plus prostate en block.  ASSISTANT:  Kerrie Pleasure, MD  FINDINGS: 1.  Multiple sentinel lymph nodes seen right greater than left in the pelvis with sentinel lymphangiography. 2.  Minimal volume of intraluminal tumor with cystoscopy.  DRAINS:   1.  Jackson-Pratt drain to bulb suction. 2.  Right lower quadrant urostomy to gravity drainage with right (red) blue bander stents.  INDICATIONS:  [name] is a very pleasant and quite vigorous 79 year old man with recent history of muscle invasive bladder cancer that is clinically localized.  He has been on a curative intent path with neoadjuvant chemotherapy, which he completed.   Restaging imaging revealed no evidence of locally advanced or distant disease.  In proceeding with this curative intent path, he wished to proceed with  cystoprostatectomy with conduit diversion.  He was admitted yesterday for bowel prep and labs and  stomal marking which went well.  Informed consent was obtained and placed in medical record.  DESCRIPTION OF PROCEDURE:  The patient is being the patient, procedure verified as cysto with injection of indocyanine green dye and cystoprostatectomy with conduit diversion was confirmed.  Procedure timeout was performed.  Intravenous administered.   General anesthesia induced.  The patient was placed into a low lithotomy position, sterile field was created prepped and draped his penis, perineum and proximal thighs using iodine and his infra-xiphoid abdomen using micropuncture technique and gluconate  after he was clipper shaved.  He was further fastened to operative table using 3-inch tape over foam padding across the supraxiphoid chest taking exquisite care to pad his in situ chemotherapy port.  His arms were tucked to the side with gel rolls.  A  test of steep Trendelenburg positioning was performed.  Found to be suitable position.  Attention was directed at cystoscopy with injection of indocyanine green dye.  Cystourethroscopy was performed with a 24-French injection scope set with a 0 degree  lens.  His anterior and posterior urethra was unremarkable.  Inspection of bladder revealed old resection sites, minimal evidence of intraluminal tumor.  Next, 4 mL of indocyanine green dye was injected locally at the intertrigone area across several  submucosal blebs.  Penis was secured to avoid bladder perforation which did not occur grossly and a silicone catheter was then placed per urethra straight drain 10 mL of water in the balloon.  Next, a high-flow, low-pressure pneumoperitoneum was obtained  using Veress technique in the supraumbilical midline and passed the aspiration and drop test.  8 mm robotic camera port was then placed in location.  Laparoscopic examination peritoneal cavity revealed no significant  adhesions, no visceral injury.   Distal ports were placed as follows:  Right paramedian 8 mm robotic port, right far lateral 12 mm AirSeal assist port, right paramedian 15 mm assistant port at the previously marked stomal site, left paramedian 8 mm robotic port, left far lateral 8 mm  robotic port.  Robot was docked and passed electronic checks.  Initial attention was directed at development of the left retroperitoneum.  Incision was made lateral to the descending colon from the area of the iliac vessels superiorly a distance  approximately 10 cm and inferiorly coursing lateral to the left medial umbilical ligament towards the anterior abdominal wall.  This created a very large retroperitoneal flap, which was used to medially retract the descending colon and bowel away from  the left retroperitoneum.  Left ureter was then encountered as it coursed over the iliac vessels and marked a vessel loop, dissected proximally for a distance of approximately 8 cm above the iliac crossing taking exquisite care to avoid devascularization  and keep a rim of fat around it and then distally to the ureterovesical junction, which was doubly clipped and ligated with the proximal clip having a dyed tag suture.  Frozen section negative for carcinoma.  Left ureter was tucked out of the true  pelvis.  Attention was directed to the left-sided pelvic lymphadenectomy.  Near infrared fluorescence sentinel lymphangiography revealed only 1 area of sentinel lymph nodes in the left in a perivesical location.  This was dissected free and set aside for  pathology.  Then template lymphadenectomy was performed in the left side.  First, the left external iliac group with the boundaries being left external iliac artery, vein, pelvic sidewall, iliac bifurcation.  Lymphostasis achieved with cold clips.   Next, left obturator group robotic-assisted free with the boundaries being left external iliac vein, obturator nerve, pelvic sidewall.   Lymphostasis was achieved with cold clips.  Left obturator nerve was inspected following these maneuvers and found to  be uninjured.  Next, the left common group was dissected free with the boundaries being aortic bifurcation and iliac bifurcation.  Set aside labeled left common lymph nodes and finally left internal group was dissected free with the boundaries being  iliac bifurcation and superior vesicle artery.  Attention was then directed at right-sided retroperitoneal dissection, but first the area for future conduit was identified.  The ileocecal junction was identified,  verified by small bowel entering the  cecum, and this corresponding to orientation opposite of the appendix.  This terminal ileum was traced proximally for a distance of approximately 14 cm to a location that appeared to have sufficient mobility for conduit formation.  At this location, a  tagged clip with silk was placed and a distal marker clip placed for confirming later and proximal distal orientation.  Attention was directed at right retroperitoneal dissection.  Incision was made lateral to the ascending colon from the area of the  iliac crossing superiorly for a distance of approximately 8 cm and distally just lateral to right medial umbilical ligament towards the anterior abdominal wall.  A counter incision was made in a Y-type fashion coursing towards the area of the aortic  bifurcation along this line.  This created a large retroperitoneal flap that was used to retract the ascending colon and right side of small bowel away from the retroperitoneal structures on the right side.  The right ureter  was encountered as it coursed  over the iliac vessels marked a vessel loop, dissected proximally for a distance of approximately 6 cm distally to the ureterovesical junction, which was doubly clipped and ligated.  Proximal clip containing an undyed suture.  Frozen section negative  for carcinoma.  The right ureter was tucked out of the  true pelvis.  Attention was directed at right-sided lymphadenectomy.  Right-sided lymphadenectomy was performed of the exact same groups as per the left.  There were several sentinel lymph nodes seen  on the right side.  These were denoted as such and pathology requisition.  Also via the right-sided retroperitoneal view the aortic bifurcation area was much easily visualized and additional lymphatic tissue was taken from this area and labeled as such.   Next, the retroperitoneal window was created in the same location from the right side to the left side and the left ureter was brought through this.  In the right ureter, left ureter, terminal ileum, tag sutures were placed into a single clip and  tucked out of the true pelvis.  Lateral dissection was then performed bilaterally, separating the lateral aspect of the urinary bladder and then lateral aspect of the prostate away from the abdominal and pelvic sidewall towards the area of the prostatic  apex.  Attention was directed at posterior dissection.  A posterior peritoneal flap was created by making an inverted U incision at a position corresponding to approximately 4 cm superior to the area of the seminal vesicles and the posterior peritoneal  flap was developed in the midline towards the area of the prostatic apex and then swept laterally on both sides, thus  exposing the vascular pedicles of the bladder and prostate.  The pedicles were controlled using a sequential stapling technique with  white load stapler x3 each side, which revealed an excellent hemostatic control of the bladder and prostate pedicles.  Exquisite care was taken to avoid any bowel injury or vascular injury which did not occur.  The obturator nerves once again inspected  and found to be uninjured.  Space of Retzius was developed by dissecting between the medial umbilical ligaments from the dome of the bladder towards the area of the dorsal venous complex which was then controlled using a  green load stapler after  confirming stapler was not cross membranous urethra.  Final able dissection was performed in this plane by transecting the membranous urethra across 50% of the circumference placing 2 extra-large clips on the in situ catheter, which was then used as a  bucket handle as the posterior circumference was then released.  This completely freed up the bladder plus prostate with seminal vesicles and membranous urethra specimen completely, which was placed in an extra-large EndoCatch bag for later retrieval.   EndoCatch bag was tucked into the left hemiabdomen notably, while the ureters and the conduit segment were in the right hemiabdomen.  Digital rectal exam was then performed using indicator glove under laparoscopic vision.  No evidence of rectal violation  was noted.  Next, the membranous urethral stump was oversewn using 3-0 V-Loc which resulted in excellent mucosal apposition to prevent prolonged intermittent drainage from the penis.  At this point, sponge and needle counts were correct.  Hemostasis  appeared excellent.  We achieved the goals of the extirpative portion of the procedure today.  A closed suction drain was brought the previous left lateral most robotic port site near the area of the peritoneal cavity and the true pelvis.  The specimen  bag  string was brought to the left paramedian robotic port site and the right ureter, left ureter, bowel tag sutures were placed into a laparoscopic grasper via the 15 mm port site in the right side hemiabdomen.  Robot was then undocked.  Incision was  then made inferior to the camera port in the midline area of the left side of the umbilicus and the specimen was retrieved via the extraction site, set aside for pathology.  A wound protector type retractor was then placed via the site and the right  ureter, left ureter, conduit segments were easily brought through this without undue tension and all appeared to have sufficient vascularity  for conduit formation.  Attention was directed at harvesting of the bowel segment.  A green load stapler was used  to take the previously marked distal ileal segment out of continuity using approximately 14 cm.  The mesentery was developed distally using 2 loads of white stapler, 1 load proximally, taking exquisite care to avoid devascularization of the conduit or  anastomotic segments which did not occur.  The conduit segment was then placed into a retroperitoneal orientation.  Attention was directed to the bowel, bowel anastomosis was performed using 2 loads green load stapler in antimesenteric fashion.  The free  end was oversewn using running silk, followed by a separate imbricating layer of running silk.  The acute angle anastomosis was bolstered using interrupted silk and the mesenteric defect was closed using interrupted silk.  The bowel anastomosis was  visibly viable and palpably patent redelivered into the abdominal cavity.  The proximal conduit staple line was excluded using running Vicryl.  Distal staple line was removed.  Attention was then directed at ureteroenteric anastomosis.  First at the left  ureter a segment of proximal conduit on the mesenteric aspect was excised 4 mm in length and 4 mucosal everting sutures were applied using 4-0 Vicryl.  The left ureter was spatulated.  Final margins sent for permanent pathology and a heel stitch was  applied of interrupted 4-0 Vicryl and a blue colored bander stent was placed 20 cm anastomosis and further ureteral bowel mucosa-to-mucosa anastomosis was performed using 2 separate running suture lines of 4-0 Vicryl, which resulted in excellent mucosal  apposition without undue tension.  A 4-0 chromic suture was purposely placed through and through the blue colored stent at the mid portion of the conduit to prevent inadvertent early displacement.  An air knot was purposely placed.  Next, attention was  directed to anastomosis of the right ureter which  was performed on the opposite side of the proximal conduit as per the left but using a red colored bander stent 27 cm from anastomosis and again permanent final margin set aside.  It was also anchored at  the midpoint of the conduit using interrupted chromic.  Next, a quarter-sized diameter column of skin and subcutaneous tissue was excised.  The previously marked stomal site and 15 mm port site level of fascia, which was then dilated to accommodate 2  surgeon's fingers and four 2-0 Vicryl sutures were placed in a quadrant type fashion at the level of the fascia and the conduit that was brought through this and then anchored at the site to prevent parastomal hernia formation.  Four rosebudding sutures  were then applied in a quadrant fashion as well but set aside.  Attention was directed to closure of the extraction site.  The bowel-bowel anastomosis in the abdominal cavity was once again visualized.  No acute problems identified.  Omentum  was brought  over this.  The fascia was reapproximated using a figure-of-eight PDS x7 followed by reapproximation of Scarpa's with a running Vicryl and the skin using subcuticular Monocryl.  Additional skin sites were closed in the same location.  Final maturation of  the stoma was performed by tying down the 4 rosebudding sutures which revealed an excellent rosebudding of the stoma and the bowel mucosa to skin was reapproximated x3 interrupted sutures in between each quadrant with 3-0 Vicryl.  Ostomy appliance was  placed.  The stent was cut to length and the procedure was terminated.  The patient tolerated the procedure well.  No immediate complications.  The patient was taken to postanesthesia care in stable condition.  CN/NUANCE  D:12/13/2019 T:12/14/2019 JOB:[id number]

## 2019-12-15 LAB — HEMOGLOBIN AND HEMATOCRIT, BLOOD
HCT: 33.7 % — ABNORMAL LOW (ref 39.0–52.0)
Hemoglobin: 11.2 g/dL — ABNORMAL LOW (ref 13.0–17.0)

## 2019-12-15 LAB — BASIC METABOLIC PANEL
Anion gap: 9 (ref 5–15)
BUN: 13 mg/dL (ref 8–23)
CO2: 26 mmol/L (ref 22–32)
Calcium: 8.9 mg/dL (ref 8.9–10.3)
Chloride: 106 mmol/L (ref 98–111)
Creatinine, Ser: 1.01 mg/dL (ref 0.61–1.24)
GFR calc Af Amer: >60 mL/min (ref >60)
GFR calc non Af Amer: >60 mL/min (ref >60)
Glucose, Bld: 138 mg/dL — ABNORMAL HIGH (ref 70–99)
Potassium: 3.3 mmol/L — ABNORMAL LOW (ref 3.5–5.1)
Sodium: 141 mmol/L (ref 135–145)

## 2019-12-15 MED ORDER — METOPROLOL TARTRATE 25 MG PO TABS
25.0000 mg | ORAL_TABLET | Freq: Two times a day (BID) | ORAL | Status: DC
  Administered 2019-12-15 – 2019-12-19 (×9): 25 mg via ORAL
  Filled 2019-12-15 (×9): qty 1

## 2019-12-15 NOTE — TOC Initial Note (Signed)
Transition of Care [facility]) - Initial/Assessment Note    Patient Details  Name: Brandon Haynes MRN: [id number] Date of Birth: Oct 25, 1940  Transition of [facility]) CM/SW Contact:    Ross Ludwig, LCSW Phone Number: 12/15/2019, 4:19 PM  Clinical Narrative:                  Patient is a 79 year old male who is alert and oriented x4.  Patient lives with his wife.  Patient has an ostomy bag, and will need home health PT and RN.  CSW contacted [facility] and spoke to [place], she said they are able to accept patient once he is medically ready for discharge.  CSW to continue to follow patient's progress throughout discharge planning.  Expected Discharge Plan: [place] Barriers to Discharge: Continued Medical Work up   Patient Goals and CMS Choice Patient states their goals for this hospitalization and ongoing recovery are:: To return back home with home health services. CMS Medicare.gov Compare Post Acute Care list provided to:: Patient Choice offered to / list presented to : Patient  Expected Discharge Plan and Services Expected Discharge Plan: [place] In-house Referral: NA   Post Acute Care Choice: Home Health                     DME Agency: NA     Representative spoke with at DME Agency: [place] Arranged: PT, RN [place] Agency: [facility] Date [place]: 12/15/19 Time [place]: [id number]    Prior Living Arrangements/Services   Lives with:: Spouse Patient language and need for interpreter reviewed:: Yes Do you feel safe going back to the place where you live?: Yes      Need for Family Participation in Patient Care: No (Comment) Care giver support system in place?: No (comment)   Criminal Activity/Legal Involvement Pertinent to Current Situation/Hospitalization: No - Comment as needed  Activities of Daily Living Home Assistive Devices/Equipment: None ADL Screening (condition at time of admission) Patient's  cognitive ability adequate to safely complete daily activities?: Yes Is the patient deaf or have difficulty hearing?: No Does the patient have difficulty seeing, even when wearing glasses/contacts?: No Does the patient have difficulty concentrating, remembering, or making decisions?: No Patient able to express need for assistance with ADLs?: Yes Does the patient have difficulty dressing or bathing?: No Independently performs ADLs?: Yes (appropriate for developmental age) Does the patient have difficulty walking or climbing stairs?: No Weakness of Legs: None Weakness of Arms/Hands: None  Permission Sought/Granted Permission sought to share information with : Family Supports Permission granted to share information with : Yes, Verbal Permission Granted  Share Information with NAME: [place], [place] [id number]  [phone]  Permission granted to share info w AGENCY: [place]        Emotional Assessment Appearance:: Appears stated age   Affect (typically observed): Accepting, Appropriate, Calm, Stable Orientation: : Oriented to Self, Oriented to Place, Oriented to  Time, Oriented to Situation Alcohol / Substance Use: Not Applicable    Admission diagnosis:  Bladder cancer [facility]) [C67.9] Patient Active Problem List   Diagnosis Date Noted  . Bladder cancer ([place]) 12/13/2019  . Port-A-Cath in place 10/04/2019  . Invasive carcinoma of urinary bladder ([place]) 06/27/2019  . Recurrent epistaxis 10/19/2017  . 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 Bilateral sensorineural hearing loss 06/30/2016  . Subjective tinnitus, bilateral 06/30/2016  . Prediabetes 04/16/2016  . Leg pain  03/06/2016  . Dyspnea on exertion 08/23/2015  . Dizziness 08/23/2015  . Rash 08/23/2015  . Coronary artery disease involving native coronary artery of native heart without angina pectoris   . Syncope 12/03/2014  . 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 Symptomatic hypotension 03/22/2014  .  Goals of care, counseling/discussion 03/22/2014  . Encounter to establish care with new doctor 02/26/2014  . Rotator cuff tear 08/28/2013  . GERD 08/29/2010  . HEARING LOSS 06/21/2008  . PERIPHERAL VASCULAR DISEASE 06/21/2008  . ALLERGIC RHINITIS 06/21/2008  . BASAL CELL CARCINOMA, HX OF 06/21/2008  . COLONIC POLYPS 03/29/2008  . HYPERCHOLESTEROLEMIA 03/29/2008  . GOUT 03/29/2008  . ANXIETY 03/29/2008  . Essential hypertension 03/29/2008  . DEGENERATIVE DISC DISEASE 03/29/2008  . BACK PAIN, LUMBAR 03/29/2008   PCP:  Tammi Sou, MD Pharmacy:   [id number] - [place], [place] - [address] [place]  [address] Phone: [phone] Fax: [phone]     Social Determinants of Health (SDOH) Interventions    Readmission Risk Interventions No flowsheet data found.

## 2019-12-15 NOTE — Progress Notes (Signed)
2 Days Post-Op Subjective: Pain controlled. Loose stools overnight, initially with blood but this has cleared. No nausea/emesis. Worked with PT/OT/ostomy  Objective: Vital signs in last 24 hours: Temp:  [97.7 F (36.5 C)-98.4 F (36.9 C)] 98.2 F (36.8 C) (03/19 0453) Pulse Rate:  [93-104] 99 (03/19 0453) Resp:  [17-18] 17 (03/19 0453) BP: (103-129)/(63-82) 121/80 (03/19 0453) SpO2:  [95 %-97 %] 95 % (03/19 0453)  Intake/Output from previous day: 03/18 0701 - 03/19 0700 In: 1565.8 [I.V.:1490.6; IV Piggyback:75.2] Out: 1895 [Urine:1500; Drains:395] Intake/Output this shift: No intake/output data recorded.  Physical Exam:  General: Alert and oriented CV: Mild tachycardia Lungs: Clear Abdomen: Soft, appropriately tender. Drain w serosang output. Ostomy with two bander stents present. Urine pink tinged, clear without clots.  Incisions: c/d/i Ext: NT, No erythema  Lab Results: Recent Labs    12/13/19 1521 12/14/19 0451 12/15/19 0405  HGB 12.0* 11.7* 11.2*  HCT 34.6* 33.7* 33.7*   BMET Recent Labs    12/14/19 0451 12/15/19 0405  NA 140 141  K 3.5 3.3*  CL 105 106  CO2 25 26  GLUCOSE 172* 138*  BUN 13 13  CREATININE 0.90 1.01  CALCIUM 8.6* 8.9     Studies/Results: No results found.  Assessment/Plan: 79 yo M s/p RC/IC on 12/13/2019 for bladder cancer. Progressing appropriately.  - Continue current pain regimen - Continue clear liquid diet - Continue ostomy teaching - Restart metoprolol 25 mg bid - Hold stool softeners for loose stools; added RN instructions to do so to orders - PT/OT, ambulate qid   LOS: 3 days   Haskel Schroeder 12/15/2019, 7:16 AM

## 2019-12-15 NOTE — Consult Note (Signed)
.  [facility] Nurse ostomy follow up Stoma type/location: Stoma is red and viable, 1 1/2 inches 2 stints in place. Peristomal assessment: NA Output:blood-tinged urine  Ostomy pouching: 1pc. Flat pouch applied with barrier ring to attempt to maintain a seal. Educational materials left in the room and 5 extra sets of barrier rings and pouches at the bedside.  Enrolled patient in [place] program: Yes, previously  Patient's pouch was changed with [place] nurse yesterday. Patient is experiencing a lot of pain when I arrive and has just received pain medication. His wife has left for the day.  Offered to answer any questions but patient denied needs. Pouch is intact draining into bedside drainage bag. Suggested patient disconnect from BSD when ambulatory.   [place] Nurse will follow along with you for continued support with ostomy teaching and care [place] MSN, RN, [place], [place], [place]

## 2019-12-15 NOTE — Progress Notes (Signed)
Occupational Therapy Treatment Patient Details Name: Brandon Haynes MRN: [id number] DOB: [birth year] Today's Date: 12/15/2019    History of present illness 79 yo admitted 3/17/21with  High-grade muscle invasive bladder cancer.  SP   LAPAROSCOPIC COMPLETE CYSTECT ILEAL CONDUIT ,RADICAL PROSTATECTOMY   OT comments  Patient agreeable to OT. Patient set up assist for donning socks seated at edge of bed, no report of increased pain. Patient supervision level for functional ambulation with rolling walker, cues for safety with management of lines during functional transfers. Patient transfer to toilet and perform hand hygiene at sink with supervision. Patient reports difficulty stepping over tub to bath at home, educate and provide visual aid of tub bench to use at home, patient verbalize understanding.    Follow Up Recommendations  No OT follow up    Equipment Recommendations  Tub/shower bench       Precautions / Restrictions Precautions Precautions: Fall Precaution Comments: left JP, ilieostomy Restrictions Weight Bearing Restrictions: No       Mobility Bed Mobility Overal bed mobility: Needs Assistance Bed Mobility: Supine to Sit;Sit to Supine     Supine to sit: Supervision;HOB elevated Sit to supine: Supervision   General bed mobility comments: supervision for line management  Transfers Overall transfer level: Needs assistance Equipment used: Rolling walker (2 wheeled) Transfers: Sit to/from Stand Sit to Stand: Supervision         General transfer comment: cues for safety with management of lines    Balance Overall balance assessment: Needs assistance Sitting-balance support: No upper extremity supported;Feet supported Sitting balance-Leahy Scale: Good     Standing balance support: Bilateral upper extremity supported;During functional activity Standing balance-Leahy Scale: Fair Standing balance comment: can maintain static standing without support, use of RW with  ambulation                           ADL either performed or assessed with clinical judgement   ADL Overall ADL's : Needs assistance/impaired     Grooming: Wash/dry hands;Supervision/safety;Standing               Lower Body Dressing: Set up;Sitting/lateral leans Lower Body Dressing Details (indicate cue type and reason): to don socks seated at edge of bed Toilet Transfer: Supervision/safety;Cueing for safety;RW;Regular Toilet;Ambulation Toilet Transfer Details (indicate cue type and reason): min cues for safety managing walker and IV pole in bathroom Toileting- Clothing Manipulation and Hygiene: Supervision/safety;Sit to/from stand       Functional mobility during ADLs: Supervision/safety;Rolling walker                 Cognition Arousal/Alertness: Awake/alert Behavior During Therapy: WFL for tasks assessed/performed Overall Cognitive Status: Within Functional Limits for tasks assessed                                                     Pertinent Vitals/ Pain       Pain Assessment: Faces Faces Pain Scale: Hurts little more Pain Location: abdomen and lower back Pain Descriptors / Indicators: Aching;Discomfort Pain Intervention(s): Monitored during session         Frequency  Min 2X/week        Progress Toward Goals  OT Goals(current goals can now be found in the care plan section)  Progress towards OT goals: Progressing toward goals  Acute Rehab  OT Goals Patient Stated Goal: to be able to put on my underwear OT Goal Formulation: With patient Time For Goal Achievement: 12/28/19 Potential to Achieve Goals: Good ADL Goals Pt Will Perform Grooming: with modified independence;standing Pt Will Perform Lower Body Dressing: with adaptive equipment;sit to/from stand;with modified independence Pt Will Transfer to Toilet: with modified independence;ambulating Pt Will Perform Toileting - Clothing Manipulation and hygiene: with  modified independence  Plan Discharge plan remains appropriate       AM-PAC OT "6 Clicks" Daily Activity     Outcome Measure   Help from another person eating meals?: None Help from another person taking care of personal grooming?: A Little Help from another person toileting, which includes using toliet, bedpan, or urinal?: A Little Help from another person bathing (including washing, rinsing, drying)?: A Little Help from another person to put on and taking off regular upper body clothing?: A Little Help from another person to put on and taking off regular lower body clothing?: A Little 6 Click Score: 19    End of Session Equipment Utilized During Treatment: Rolling walker  OT Visit Diagnosis: Unsteadiness on feet (R26.81);Pain Pain - part of body: (abdomen)   Activity Tolerance Patient tolerated treatment well   Patient Left in bed;with call bell/phone within reach           Time: 1000-1031 OT Time Calculation (min): 31 min  Charges: OT General Charges $OT Visit: 1 Visit OT Treatments $Self Care/Home Management : 23-37 mins  [place] OT office: [place] 12/15/2019, 2:11 PM

## 2019-12-15 NOTE — Care Management Important Message (Signed)
Important Message  Patient Details IM Letter given to Evette Cristal SW Case Manager to present to the Patient Name: Brandon Haynes MRN: [id number] Date of Birth: [birth year]   Medicare Important Message Given:  Yes     Kerin Salen 12/15/2019, 11:37 AM

## 2019-12-15 NOTE — Progress Notes (Signed)
Physical Therapy Treatment Patient Details Name: Brandon Haynes MRN: [id number] DOB: Jul 18, 1941 Today's Date: 12/15/2019    History of Present Illness 79 yo admitted 3/17/21with  High-grade muscle invasive bladder cancer.  SP   LAPAROSCOPIC COMPLETE CYSTECT ILEAL CONDUIT ,RADICAL PROSTATECTOMY    PT Comments    Patient making good progress with acute PT and tolerated greater distance for gait this session with no overt LOB noted. Pt maintained safe proximity/use of RW during gait and required intermittent min assist to manage walker with turns. Pt requires less assist for transfers and was able to initiate power up with out assist from EOB and toilet today. Pt educated on benefits of ankle pumps to work on circulation while resting throughout the day. Acute PT recommendations remain appropriate, will follow and progress as able in acute setting.     Follow Up Recommendations  Home health PT     Equipment Recommendations  Rolling walker with 5" wheels    Recommendations for Other Services       Precautions / Restrictions Precautions Precautions: Fall Precaution Comments: left JP, ilieostomy Restrictions Weight Bearing Restrictions: No    Mobility  Bed Mobility Overal bed mobility: Needs Assistance Bed Mobility: Supine to Sit;Sit to Supine     Supine to sit: Supervision;HOB elevated Sit to supine: Supervision   General bed mobility comments: assist for line management and pt required extrat time to raise up to EOB. cue requried for pt to sit high enough towards [facility] prior to return to supine.  Transfers Overall transfer level: Needs assistance Equipment used: Rolling walker (2 wheeled) Transfers: Sit to/from [facility] Sit to Stand: Supervision;Min guard Stand pivot transfers: Min guard;Supervision       General transfer comment: cues for safe technique with RW for hand placement. min guard for safety, pt able to complete power up with UE use on EOB and  on grab bars in bathroom.  Ambulation/Gait Ambulation/Gait assistance: Min assist;Min guard Gait Distance (Feet): 200 Feet Assistive device: Rolling walker (2 wheeled) Gait Pattern/deviations: Step-through pattern;Decreased stride length Gait velocity: fait   General Gait Details: pt with improved gait velocity and maintained safe proximity to RW throughout gait, no overt LOB noted, min assist to position walker in bathroom during turns.   Stairs          Wheelchair Mobility    Modified Rankin (Stroke Patients Only)       Balance Overall balance assessment: Needs assistance Sitting-balance support: No upper extremity supported;Feet supported Sitting balance-Leahy Scale: Good     Standing balance support: Bilateral upper extremity supported;During functional activity Standing balance-Leahy Scale: Fair Standing balance comment: can maintain static standing without support, use of RW with ambulation           Cognition Arousal/Alertness: Awake/alert Behavior During Therapy: WFL for tasks assessed/performed Overall Cognitive Status: Within Functional Limits for tasks assessed           Exercises General Exercises - Lower Extremity Quad Sets: AROM;Both;10 reps;Supine    General Comments        Pertinent Vitals/Pain Pain Assessment: Faces Faces Pain Scale: Hurts little more Pain Location: abdomen and lower back Pain Descriptors / Indicators: Aching;Discomfort Pain Intervention(s): Monitored during session           PT Goals (current goals can now be found in the care plan section) Acute Rehab PT Goals Patient Stated Goal: to be able to put on my underwear PT Goal Formulation: With patient Time For Goal Achievement: 12/28/19 Potential  to Achieve Goals: Good Progress towards PT goals: Progressing toward goals    Frequency    Min 3X/week      PT Plan Current plan remains appropriate       AM-PAC PT "6 Clicks" Mobility   Outcome Measure   Help needed turning from your back to your side while in a flat bed without using bedrails?: A Little Help needed moving from lying on your back to sitting on the side of a flat bed without using bedrails?: A Little Help needed moving to and from a bed to a chair (including a wheelchair)?: A Little Help needed standing up from a chair using your arms (e.g., wheelchair or bedside chair)?: A Little Help needed to walk in hospital room?: A Little Help needed climbing 3-5 steps with a railing? : A Little 6 Click Score: 18    End of Session Equipment Utilized During Treatment: Gait belt Activity Tolerance: Patient tolerated treatment well Patient left: in bed;with call bell/phone within reach;with family/visitor present;with bed alarm set Nurse Communication: Mobility status PT Visit Diagnosis: Unsteadiness on feet (R26.81);Difficulty in walking, not elsewhere classified (R26.2);Pain     Time: 1134-1208 PT Time Calculation (min) (ACUTE ONLY): 34 min  Charges:  $Gait Training: 8-22 mins $Therapeutic Activity: 8-22 mins                     Verner Mould, DPT Physical Therapist with [facility] [phone]  12/15/2019 3:38 PM

## 2019-12-16 LAB — BPAM RBC
Blood Product Expiration Date: 202103302359
Blood Product Expiration Date: 202103302359
Unit Type and Rh: 600
Unit Type and Rh: 600

## 2019-12-16 LAB — TYPE AND SCREEN
ABO/RH(D): A NEG
Antibody Screen: NEGATIVE
Unit division: 0
Unit division: 0

## 2019-12-16 LAB — BASIC METABOLIC PANEL
Anion gap: 7 (ref 5–15)
BUN: 9 mg/dL (ref 8–23)
CO2: 27 mmol/L (ref 22–32)
Calcium: 8.4 mg/dL — ABNORMAL LOW (ref 8.9–10.3)
Chloride: 106 mmol/L (ref 98–111)
Creatinine, Ser: 0.78 mg/dL (ref 0.61–1.24)
GFR calc Af Amer: >60 mL/min (ref >60)
GFR calc non Af Amer: >60 mL/min (ref >60)
Glucose, Bld: 132 mg/dL — ABNORMAL HIGH (ref 70–99)
Potassium: 3.2 mmol/L — ABNORMAL LOW (ref 3.5–5.1)
Sodium: 140 mmol/L (ref 135–145)

## 2019-12-16 LAB — HEMOGLOBIN AND HEMATOCRIT, BLOOD
HCT: 27.9 % — ABNORMAL LOW (ref 39.0–52.0)
Hemoglobin: 9.4 g/dL — ABNORMAL LOW (ref 13.0–17.0)

## 2019-12-16 NOTE — Progress Notes (Signed)
3 Days Post-Op Subjective: Patient reports that he is having more brown bowel movements, passing flatus.  No real abdominal discomfort.  He is ambulating some.  Objective: Vital signs in last 24 hours: Temp:  [97.7 F (36.5 C)-98.4 F (36.9 C)] 98.4 F (36.9 C) (03/20 0421) Pulse Rate:  [79-88] 88 (03/20 0814) Resp:  [14-17] 16 (03/20 0421) BP: (116-134)/(77-84) 122/84 (03/20 0814) SpO2:  [97 %-98 %] 97 % (03/20 0421)  Intake/Output from previous day: 03/19 0701 - 03/20 0700 In: 3176.4 [P.O.:840; I.V.:2236.4] Out: 2555 [Urine:1900; Drains:655] Intake/Output this shift: Total I/O In: 120 [P.O.:120] Out: 760 [Urine:650; Drains:110]  Physical Exam:  Constitutional: Vital signs reviewed. WD WN in NAD   Eyes: PERRL, No scleral icterus.   Cardiovascular: RRR Pulmonary/Chest: Normal effort Abdominal: Soft, minimal distention.  Stoma pink/viable.  Abdomen appropriately tender Extremities: No cyanosis or edema, no calf tenderness.  PAS hose on  Lab Results: Recent Labs    12/14/19 0451 12/15/19 0405 12/16/19 0604  HGB 11.7* 11.2* 9.4*  HCT 33.7* 33.7* 27.9*   BMET Recent Labs    12/15/19 0405 12/16/19 0604  NA 141 140  K 3.3* 3.2*  CL 106 106  CO2 26 27  GLUCOSE 138* 132*  BUN 13 9  CREATININE 1.01 0.78  CALCIUM 8.9 8.4*   No results for input(s): LABPT, INR in the last 72 hours. No results for input(s): LABURIN in the last 72 hours. Results for orders placed or performed during the hospital encounter of 12/12/19  Respiratory Panel by RT PCR (Flu A&B, Covid) - Nasopharyngeal Swab     Status: None   Collection Time: 12/12/19  5:35 PM   Specimen: Nasopharyngeal Swab  Result Value Ref Range Status   SARS Coronavirus 2 by RT PCR NEGATIVE NEGATIVE Final    Comment: (NOTE) SARS-CoV-2 target nucleic acids are NOT DETECTED. The SARS-CoV-2 RNA is generally detectable in upper respiratoy specimens during the acute phase of infection. The lowest concentration of  SARS-CoV-2 viral copies this assay can detect is 131 copies/mL. A negative result does not preclude SARS-Cov-2 infection and should not be used as the sole basis for treatment or other patient management decisions. A negative result may occur with  improper specimen collection/handling, submission of specimen other than nasopharyngeal swab, presence of viral mutation(s) within the areas targeted by this assay, and inadequate number of viral copies (<131 copies/mL). A negative result must be combined with clinical observations, patient history, and epidemiological information. The expected result is Negative. Fact Sheet for Patients:  PinkCheek.be Fact Sheet for Healthcare Providers:  GravelBags.it This test is not yet ap proved or cleared by the [place] FDA and  has been authorized for detection and/or diagnosis of SARS-CoV-2 by FDA under an Emergency Use Authorization (EUA). This EUA will remain  in effect (meaning this test can be used) for the duration of the COVID-19 declaration under Section 564(b)(1) of the Act, 21 U.S.C. section 360bbb-3(b)(1), unless the authorization is terminated or revoked sooner.    Influenza A by PCR NEGATIVE NEGATIVE Final   Influenza B by PCR NEGATIVE NEGATIVE Final    Comment: (NOTE) The Xpert Xpress SARS-CoV-2/FLU/RSV assay is intended as an aid in  the diagnosis of influenza from Nasopharyngeal swab specimens and  should not be used as a sole basis for treatment. Nasal washings and  aspirates are unacceptable for Xpert Xpress SARS-CoV-2/FLU/RSV  testing. Fact Sheet for Patients: PinkCheek.be Fact Sheet for Healthcare Providers: GravelBags.it This test is not yet approved or cleared  by the [place] and  has been authorized for detection and/or diagnosis of SARS-CoV-2 by  FDA under an Emergency Use Authorization (EUA).  This EUA will remain  in effect (meaning this test can be used) for the duration of the  Covid-19 declaration under Section 564(b)(1) of the Act, 21  U.S.C. section 360bbb-3(b)(1), unless the authorization is  terminated or revoked. Performed at [facility], [place] [address], [place], [place] [address]   Surgical PCR screen     Status: Abnormal   Collection Time: 12/12/19  7:32 PM   Specimen: Nasal Mucosa; Nasal Swab  Result Value Ref Range Status   MRSA, PCR NEGATIVE NEGATIVE Final   Staphylococcus aureus POSITIVE (A) NEGATIVE Final    Comment: (NOTE) The Xpert SA Assay (FDA approved for NASAL specimens in patients 23 years of age and older), is one component of a comprehensive surveillance program. It is not intended to diagnose infection nor to guide or monitor treatment. Performed at [facility], [place] [address], [place], [place] [address]     Studies/Results: No results found.  Assessment/Plan:   He seems to be doing well at this point.  Bowel function returning.  I will advance him to full liquids.  I will decrease IV fluid rate.  He will continue to ambulate.   LOS: 4 days   Jorja Loa 12/16/2019, 10:25 AM

## 2019-12-16 NOTE — Progress Notes (Signed)
Pt seen on evening IV rounds; pt complaining of pain, needing more than "just some tylenol"  ; pt asked if he could bring in his Tramadol from home to get better pain control;  Will relay this info to the pt's nurse.

## 2019-12-17 LAB — BASIC METABOLIC PANEL
Anion gap: 6 (ref 5–15)
BUN: 9 mg/dL (ref 8–23)
CO2: 28 mmol/L (ref 22–32)
Calcium: 8.3 mg/dL — ABNORMAL LOW (ref 8.9–10.3)
Chloride: 106 mmol/L (ref 98–111)
Creatinine, Ser: 0.85 mg/dL (ref 0.61–1.24)
GFR calc Af Amer: >60 mL/min (ref >60)
GFR calc non Af Amer: >60 mL/min (ref >60)
Glucose, Bld: 105 mg/dL — ABNORMAL HIGH (ref 70–99)
Potassium: 3.1 mmol/L — ABNORMAL LOW (ref 3.5–5.1)
Sodium: 140 mmol/L (ref 135–145)

## 2019-12-17 LAB — HEMOGLOBIN AND HEMATOCRIT, BLOOD
HCT: 26.7 % — ABNORMAL LOW (ref 39.0–52.0)
Hemoglobin: 9 g/dL — ABNORMAL LOW (ref 13.0–17.0)

## 2019-12-17 MED ORDER — ALLOPURINOL 300 MG PO TABS
300.0000 mg | ORAL_TABLET | Freq: Every day | ORAL | Status: DC
  Administered 2019-12-17 – 2019-12-19 (×3): 300 mg via ORAL
  Filled 2019-12-17 (×3): qty 1

## 2019-12-17 MED ORDER — IBUPROFEN 200 MG PO TABS: 400.0000 mg | ORAL_TABLET | Freq: Four times a day (QID) | ORAL | Status: DC | PRN

## 2019-12-17 NOTE — Progress Notes (Signed)
4 Days Post-Op Subjective: Biggest complaint is lower back pain.  No abdominal pain.  Worried about getting back on his allopurinol.  Objective: Vital signs in last 24 hours: Temp:  [97.6 F (36.4 C)-98.8 F (37.1 C)] 98.8 F (37.1 C) (03/21 0605) Pulse Rate:  [77-91] 77 (03/21 0605) Resp:  [16-18] 18 (03/21 0605) BP: (120-151)/(77-96) 141/80 (03/21 0605) SpO2:  [95 %-98 %] 98 % (03/21 0605)  Intake/Output from previous day: 03/20 0701 - 03/21 0700 In: 910.1 [P.O.:240; I.V.:670.1] Out: 2335 [Urine:1900; Drains:435] Intake/Output this shift: No intake/output data recorded.  Physical Exam:  Constitutional: Vital signs reviewed. WD WN in NAD   Eyes: PERRL, No scleral icterus.   Cardiovascular: RRR Pulmonary/Chest: Normal effort Abdominal: Soft.  Appropriate tenderness.  Stoma is pink, viable.  Organomegaly, or guarding present. : Extremities: No cyanosis or edema   Lab Results: Recent Labs    12/15/19 0405 12/16/19 0604 12/17/19 0401  HGB 11.2* 9.4* 9.0*  HCT 33.7* 27.9* 26.7*   BMET Recent Labs    12/16/19 0604 12/17/19 0401  NA 140 140  K 3.2* 3.1*  CL 106 106  CO2 27 28  GLUCOSE 132* 105*  BUN 9 9  CREATININE 0.78 0.85  CALCIUM 8.4* 8.3*   No results for input(s): LABPT, INR in the last 72 hours. No results for input(s): LABURIN in the last 72 hours. Results for orders placed or performed during the hospital encounter of 12/12/19  Respiratory Panel by RT PCR (Flu A&B, Covid) - Nasopharyngeal Swab     Status: None   Collection Time: 12/12/19  5:35 PM   Specimen: Nasopharyngeal Swab  Result Value Ref Range Status   SARS Coronavirus 2 by RT PCR NEGATIVE NEGATIVE Final    Comment: (NOTE) SARS-CoV-2 target nucleic acids are NOT DETECTED. The SARS-CoV-2 RNA is generally detectable in upper respiratoy specimens during the acute phase of infection. The lowest concentration of SARS-CoV-2 viral copies this assay can detect is 131 copies/mL. A negative result  does not preclude SARS-Cov-2 infection and should not be used as the sole basis for treatment or other patient management decisions. A negative result may occur with  improper specimen collection/handling, submission of specimen other than nasopharyngeal swab, presence of viral mutation(s) within the areas targeted by this assay, and inadequate number of viral copies (<131 copies/mL). A negative result must be combined with clinical observations, patient history, and epidemiological information. The expected result is Negative. Fact Sheet for Patients:  PinkCheek.be Fact Sheet for Healthcare Providers:  GravelBags.it This test is not yet ap proved or cleared by the [place] FDA and  has been authorized for detection and/or diagnosis of SARS-CoV-2 by FDA under an Emergency Use Authorization (EUA). This EUA will remain  in effect (meaning this test can be used) for the duration of the COVID-19 declaration under Section 564(b)(1) of the Act, 21 U.S.C. section 360bbb-3(b)(1), unless the authorization is terminated or revoked sooner.    Influenza A by PCR NEGATIVE NEGATIVE Final   Influenza B by PCR NEGATIVE NEGATIVE Final    Comment: (NOTE) The Xpert Xpress SARS-CoV-2/FLU/RSV assay is intended as an aid in  the diagnosis of influenza from Nasopharyngeal swab specimens and  should not be used as a sole basis for treatment. Nasal washings and  aspirates are unacceptable for Xpert Xpress SARS-CoV-2/FLU/RSV  testing. Fact Sheet for Patients: PinkCheek.be Fact Sheet for Healthcare Providers: GravelBags.it This test is not yet approved or cleared by the [place] and  has been authorized  for detection and/or diagnosis of SARS-CoV-2 by  FDA under an Emergency Use Authorization (EUA). This EUA will remain  in effect (meaning this test can be used) for the duration of  the  Covid-19 declaration under Section 564(b)(1) of the Act, 21  U.S.C. section 360bbb-3(b)(1), unless the authorization is  terminated or revoked. Performed at [facility], [place] [address], [place], [place] [address]   Surgical PCR screen     Status: Abnormal   Collection Time: 12/12/19  7:32 PM   Specimen: Nasal Mucosa; Nasal Swab  Result Value Ref Range Status   MRSA, PCR NEGATIVE NEGATIVE Final   Staphylococcus aureus POSITIVE (A) NEGATIVE Final    Comment: (NOTE) The Xpert SA Assay (FDA approved for NASAL specimens in patients 13 years of age and older), is one component of a comprehensive surveillance program. It is not intended to diagnose infection nor to guide or monitor treatment. Performed at [facility], [place] [address], [place], [place] [address]     Studies/Results: No results found.  Assessment/Plan:   Postop day #4, robotic cystoprostatectomy.  He is doing well.  We will advance him to a regular diet today.  His biggest complaint is lower back pain which is pre-existing.  I will add low-dose ibuprofen.   LOS: 5 days   Jorja Loa 12/17/2019, 8:12 AM

## 2019-12-17 NOTE — Consult Note (Signed)
[facility] Nurse ostomy consult note Appointment made for 1pm on Monday, 12/18/19 for continued ostomy teaching with patient and wife. Plan for pouch change at that time.  Thanks, Maudie Flakes, MSN, RN, [place], Arther Abbott  Pager# [phone]

## 2019-12-17 NOTE — Progress Notes (Signed)
Spoke to patients wife, Vaughan Basta, and informed her that Margarita Grizzle with wound care will be here 3/22 at 1pm to discuss patients ostomy care. Patients wife agreed to be here at that time.

## 2019-12-18 LAB — BASIC METABOLIC PANEL
Anion gap: 6 (ref 5–15)
BUN: 11 mg/dL (ref 8–23)
CO2: 28 mmol/L (ref 22–32)
Calcium: 8.4 mg/dL — ABNORMAL LOW (ref 8.9–10.3)
Chloride: 107 mmol/L (ref 98–111)
Creatinine, Ser: 0.82 mg/dL (ref 0.61–1.24)
GFR calc Af Amer: >60 mL/min (ref >60)
GFR calc non Af Amer: >60 mL/min (ref >60)
Glucose, Bld: 107 mg/dL — ABNORMAL HIGH (ref 70–99)
Potassium: 3.2 mmol/L — ABNORMAL LOW (ref 3.5–5.1)
Sodium: 141 mmol/L (ref 135–145)

## 2019-12-18 LAB — CREATININE, FLUID (PLEURAL, PERITONEAL, JP DRAINAGE): Creat, Fluid: 0.8 mg/dL

## 2019-12-18 LAB — HEMOGLOBIN AND HEMATOCRIT, BLOOD
HCT: 26.8 % — ABNORMAL LOW (ref 39.0–52.0)
Hemoglobin: 8.8 g/dL — ABNORMAL LOW (ref 13.0–17.0)

## 2019-12-18 NOTE — Progress Notes (Signed)
Occupational Therapy Treatment Patient Details Name: Brandon Haynes MRN: [id number] DOB: 1941/04/18 Today's Date: 12/18/2019    History of present illness 79 yo admitted 3/17/21with  High-grade muscle invasive bladder cancer.  SP   LAPAROSCOPIC COMPLETE CYSTECT ILEAL CONDUIT ,RADICAL PROSTATECTOMY   OT comments  Patient is progressing towards acute OT goals. Patient is supervision level for bed mobility and functional transfers with min cues for safety with rolling walker navigating obstacles in his room. Patient supervision for sink side grooming/hygiene and functional ambulation in hallway demonstrating good activity tolerance for self care. Will continue to follow.   Follow Up Recommendations  No OT follow up    Equipment Recommendations  Tub/shower bench       Precautions / Restrictions Precautions Precautions: Fall Precaution Comments: left JP, ilieostomy Restrictions Weight Bearing Restrictions: No       Mobility Bed Mobility Overal bed mobility: Needs Assistance Bed Mobility: Supine to Sit;Sit to Supine     Supine to sit: Supervision;HOB elevated Sit to supine: Supervision   General bed mobility comments: assist for line/catheter management  Transfers Overall transfer level: Needs assistance Equipment used: Rolling walker (2 wheeled) Transfers: Sit to/from Stand Sit to Stand: Supervision         General transfer comment: min cues to reach back when sitting onto edge of bed    Balance Overall balance assessment: Needs assistance Sitting-balance support: No upper extremity supported;Feet supported Sitting balance-Leahy Scale: Good     Standing balance support: Bilateral upper extremity supported;During functional activity Standing balance-Leahy Scale: Fair Standing balance comment: can maintain static standing without support, use of RW with ambulation                           ADL either performed or assessed with clinical judgement   ADL  Overall ADL's : Needs assistance/impaired     Grooming: Wash/dry hands;Oral care;Supervision/safety;Standing                   Toilet Transfer: Supervision/safety;Cueing for safety;Ambulation;RW;Regular Glass blower/designer Details (indicate cue type and reason): simulated with functional mobility, min cues for safety managing walker around obstacles in room         Functional mobility during ADLs: Supervision/safety;Rolling walker                 Cognition Arousal/Alertness: Awake/alert Behavior During Therapy: WFL for tasks assessed/performed Overall Cognitive Status: Within Functional Limits for tasks assessed                                                General Comments patient participate in functional ambulation with rolling walker at supervision level, no loss of balance noted.    Pertinent Vitals/ Pain       Pain Assessment: Faces Faces Pain Scale: Hurts a little bit Pain Location: lower back Pain Descriptors / Indicators: Discomfort(chronic) Pain Intervention(s): Monitored during session         Frequency  Min 2X/week        Progress Toward Goals  OT Goals(current goals can now be found in the care plan section)  Progress towards OT goals: Progressing toward goals  Acute Rehab OT Goals Patient Stated Goal: to be able to put on my underwear OT Goal Formulation: With patient Time For Goal Achievement: 12/28/19 Potential to Achieve Goals: Good  ADL Goals Pt Will Perform Grooming: with modified independence;standing Pt Will Perform Lower Body Dressing: with adaptive equipment;sit to/from stand;with modified independence Pt Will Transfer to Toilet: with modified independence;ambulating Pt Will Perform Toileting - Clothing Manipulation and hygiene: with modified independence  Plan Discharge plan remains appropriate       AM-PAC OT "6 Clicks" Daily Activity     Outcome Measure   Help from another person eating meals?:  None Help from another person taking care of personal grooming?: A Little Help from another person toileting, which includes using toliet, bedpan, or urinal?: A Little Help from another person bathing (including washing, rinsing, drying)?: A Little Help from another person to put on and taking off regular upper body clothing?: A Little Help from another person to put on and taking off regular lower body clothing?: A Little 6 Click Score: 19    End of Session Equipment Utilized During Treatment: Rolling walker  OT Visit Diagnosis: Unsteadiness on feet (R26.81);Pain Pain - part of body: (back, abdomen)   Activity Tolerance Patient tolerated treatment well   Patient Left in bed;with call bell/phone within reach           Time: 0901-0926 OT Time Calculation (min): 25 min  Charges: OT General Charges $OT Visit: 1 Visit OT Treatments $Self Care/Home Management : 23-37 mins  Shon Millet OT OT office: [place] 12/18/2019, 11:56 AM

## 2019-12-18 NOTE — Progress Notes (Signed)
Physical Therapy Treatment Patient Details Name: Brandon Haynes MRN: [id number] DOB: 10/31/1940 Today's Date: 12/18/2019    History of Present Illness 79 yo admitted 3/17/21with  High-grade muscle invasive bladder cancer.  SP   LAPAROSCOPIC COMPLETE CYSTECT ILEAL CONDUIT ,RADICAL PROSTATECTOMY    PT Comments    Assisted OOB to amb a functional distance.  General Gait Details: light need for walker due to back pain with mild c/o fatigue.  Believe pt will not need a walker once in home, maybe for community.  Needs to follow up with his Neurologist re low back pain.    Follow Up Recommendations  Home health PT     Equipment Recommendations  Rolling walker with 5" wheels    Recommendations for Other Services       Precautions / Restrictions Precautions Precautions: Fall Precaution Comments: left JP, ilieostomy Restrictions Weight Bearing Restrictions: No    Mobility  Bed Mobility Overal bed mobility: Modified Independent Bed Mobility: Supine to Sit;Sit to Supine     Supine to sit: Supervision;HOB elevated Sit to supine: Supervision   General bed mobility comments: increased time due to low back pain  Transfers Overall transfer level: Needs assistance Equipment used: None Transfers: Sit to/from Stand Sit to Stand: Supervision Stand pivot transfers: Supervision       General transfer comment: good use of hands to steady self.  Good safety cognition.  Ambulation/Gait Ambulation/Gait assistance: Supervision Gait Distance (Feet): 210 Feet Assistive device: Rolling walker (2 wheeled) Gait Pattern/deviations: Step-through pattern;Decreased stride length Gait velocity: WFL   General Gait Details: light need for walker due to back pain with mild c/o fatigue.  Believe pt will not need a walker once in home, maybe for community.   Stairs             Wheelchair Mobility    Modified Rankin (Stroke Patients Only)       Balance Overall balance assessment:  Needs assistance Sitting-balance support: No upper extremity supported;Feet supported Sitting balance-Leahy Scale: Good     Standing balance support: Bilateral upper extremity supported;During functional activity Standing balance-Leahy Scale: Fair Standing balance comment: can maintain static standing without support, use of RW with ambulation                            Cognition Arousal/Alertness: Awake/alert Behavior During Therapy: WFL for tasks assessed/performed Overall Cognitive Status: Within Functional Limits for tasks assessed                                 General Comments: AxO x 4 lives in [place] Comments General comments (skin integrity, edema, etc.): patient participate in functional ambulation with rolling walker at supervision level, no loss of balance noted.      Pertinent Vitals/Pain Pain Assessment: Faces Faces Pain Scale: Hurts even more Pain Location: low back pain (chronic) "I am due for another injection" pt fu his Neurologist Pain Descriptors / Indicators: Discomfort;Grimacing;Stabbing Pain Intervention(s): Monitored during session;Repositioned    Home Living                      Prior Function            PT Goals (current goals can now be found in the care plan section) Acute Rehab PT Goals Patient Stated Goal: to be able  to put on my underwear Progress towards PT goals: Progressing toward goals    Frequency    Min 3X/week      PT Plan Current plan remains appropriate    Co-evaluation              AM-PAC PT "6 Clicks" Mobility   Outcome Measure  Help needed turning from your back to your side while in a flat bed without using bedrails?: None Help needed moving from lying on your back to sitting on the side of a flat bed without using bedrails?: None Help needed moving to and from a bed to a chair (including a wheelchair)?: None Help needed standing up from a  chair using your arms (e.g., wheelchair or bedside chair)?: A Little Help needed to walk in hospital room?: A Little Help needed climbing 3-5 steps with a railing? : A Little 6 Click Score: 21    End of Session Equipment Utilized During Treatment: Gait belt Activity Tolerance: Patient tolerated treatment well Patient left: in bed;with call bell/phone within reach;with family/visitor present;with bed alarm set Nurse Communication: Mobility status PT Visit Diagnosis: Unsteadiness on feet (R26.81);Difficulty in walking, not elsewhere classified (R26.2);Pain     Time: 1142-1200 PT Time Calculation (min) (ACUTE ONLY): 18 min  Charges:  $Gait Training: 8-22 mins                     Rica Koyanagi  PTA Acute  Rehabilitation Services Pager      [phone] Office      [phone]

## 2019-12-18 NOTE — Consult Note (Signed)
[place] Nurse ostomy follow up Stoma type/location: RLQ ileal conduit, 2 stents in place, Red = right, Blue = left. Stomal assessment/size: 1 and 3/8 inches, round, os at center, edematous Peristomal assessment: intact Treatment options for stomal/peristomal skin: skin barrier ring  Output: amber urine, clear  Ostomy pouching: 1pc.flat urostomy pouch with skin barrier ring Education provided:  Wife and daughter present for extended session.  Wife cuts pouch after assistance provided with tracing of pattern. She stretches and places skin barrier ring on pouch.  Taught how to correct size of ring is she accidentally over-stretches ring. Taught to remove pouch from top to bottom and rationale. Taught that small areas of bleeding are normal when washing stoma. Assisted to place pouch with stents. Patient held pouch in place for 20 seconds to enhance adhesion of pectin skin barrier.  Wife removes paper backing from pouch.  Patient practices attaching and detaching from bedside urinary drainage bag.  Reviewed previously taught method of cleansing bedside urinary drainage bag and how to straighten tubing when it becomes twisted.  Reviewed emptying when 1/3 to 1/2 full.  Taught to place a receptacle beneath the bedside urinary drainage bag. Enrolled patient in [place] Discharge program: Yes, previously.  8 pouches, 8 rings, 1 extra bedside urinary drainage bag, 2 drain tube adapters at bedside.  [place] nursing team will follow, and will remain available to this patient, the nursing and medical teams.   Thanks, Maudie Flakes, MSN, RN, [place], Arther Abbott  Pager# [phone]

## 2019-12-18 NOTE — Care Management Important Message (Signed)
Important Message  Patient Details IM Letter given to Evette Cristal SW Case Manager to present to the Patient Name: Brandon Haynes MRN: [id number] Date of Birth: 06-30-1941   Medicare Important Message Given:  Yes     Kerin Salen 12/18/2019, 1:08 PM

## 2019-12-18 NOTE — Progress Notes (Signed)
5 Days Post-Op   Subjective/Chief Complaint:  1 - Bladder Cancer - s/p robotic cystoprostatectomy with ICD sentinal + template node dissection 12/13/19. Path pending. Admitted 3/16 for stomal markign and bowel prep. Stepdown POD 0, transferred to med-surg POD1.   2 - Ileus / Bowel Function - bowel anastamosis as part of surgery 3/17. NPO initially, advanced to clears and then regular diet as bowel funciton resumed.   3 - Disposition / Rehab - pt independent at baseline but significant lumbago. PT eval recs rolling walker and HHPT. Ostomy team working with him as well.   Today " Brandon Haynes" continues to progress. Tollerating reg diet with resumed bowel function. Getting more ostomy teachign this afternoon. PT recs HHPT.    Objective: Vital signs in last 24 hours: Temp:  [97.7 F (36.5 C)-98.3 F (36.8 C)] 97.7 F (36.5 C) (03/22 0546) Pulse Rate:  [68-82] 68 (03/22 0546) Resp:  [16] 16 (03/22 0546) BP: (121-126)/(71-78) 126/77 (03/22 0546) SpO2:  [96 %-98 %] 98 % (03/22 0546) Last BM Date: 12/17/19  Intake/Output from previous day: 03/21 0701 - 03/22 0700 In: 1436.7 [P.O.:600; I.V.:836.7] Out: 1190 [Urine:800; Drains:390] Intake/Output this shift: Total I/O In: -  Out: 25 [Drains:25]  General appearance: alert and cooperative Eyes: negative Nose: Nares normal. Septum midline. Mucosa normal. No drainage or sinus tenderness. Throat: lips, mucosa, and tongue normal; teeth and gums normal Neck: supple, symmetrical, trachea midline Back: symmetric, no curvature. ROM normal. No CVA tenderness. Resp: non-labored on room air.  Cardio: Nl rate GI: soft, non-tender; bowel sounds normal; no masses,  no organomegaly and RLQ Urostomy pink and patent wtih Rt (red) and Lt (blue) bander stents in situ. Recent surgical sites c/d/i. JP serous and non-foul.  Pelvic: no catheter Extremities: extremities normal, atraumatic, no cyanosis or edema Pulses: 2+ and symmetric Skin: Skin color, texture,  turgor normal. No rashes or lesions Lymph nodes: Cervical, supraclavicular, and axillary nodes normal. Neurologic: Grossly normal  Lab Results:  Recent Labs    12/17/19 0401 12/18/19 0423  HGB 9.0* 8.8*  HCT 26.7* 26.8*   BMET Recent Labs    12/17/19 0401 12/18/19 0423  NA 140 141  K 3.1* 3.2*  CL 106 107  CO2 28 28  GLUCOSE 105* 107*  BUN 9 11  CREATININE 0.85 0.82  CALCIUM 8.3* 8.4*   PT/INR No results for input(s): LABPROT, INR in the last 72 hours. ABG No results for input(s): PHART, HCO3 in the last 72 hours.  Invalid input(s): PCO2, PO2  Studies/Results: No results found.  Anti-infectives: Anti-infectives (From admission, onward)   Start     Dose/Rate Route Frequency Ordered Stop   12/13/19 1800  piperacillin-tazobactam (ZOSYN) IVPB 3.375 g     3.375 g 12.5 mL/hr over 240 Minutes Intravenous Every 8 hours 12/13/19 1721 12/14/19 1200   12/13/19 0730  piperacillin-tazobactam (ZOSYN) IVPB 3.375 g     3.375 g 100 mL/hr over 30 Minutes Intravenous 30 min pre-op 12/12/19 1655 12/13/19 0858   12/12/19 1800  neomycin (MYCIFRADIN) tablet 1,000 mg     1,000 mg Oral Every 4 hours 12/12/19 1749 12/12/19 2359   12/12/19 1800  metroNIDAZOLE (FLAGYL) tablet 500 mg     500 mg Oral Every 4 hours 12/12/19 1749 12/13/19 0020      Assessment/Plan:  1 - Bladder Cancer - doing well POD 5. Saline lock, check JP Cr.   2 - Ileus / Bowel Function - resolved, continue reg diet.   3 - Disposition / Rehab -  likely DC tomorrow with HHRN and HHPT.   Brandon Haynes 12/18/2019

## 2019-12-19 MED ORDER — HEPARIN SOD (PORK) LOCK FLUSH 100 UNIT/ML IV SOLN
500.0000 [IU] | INTRAVENOUS | Status: AC | PRN
  Administered 2019-12-19: 500 [IU]
  Filled 2019-12-19: qty 5

## 2019-12-19 MED ORDER — OXYCODONE-ACETAMINOPHEN 5-325 MG PO TABS: 1.0000 | ORAL_TABLET | Freq: Four times a day (QID) | ORAL | 0 refills | Status: DC | PRN

## 2019-12-19 NOTE — TOC Transition Note (Signed)
Transition of Care [facility]) - CM/SW Discharge Note   Patient Details  Name: Brandon Haynes MRN: [id number] Date of Birth: 1941-03-23  [facility] of [facility]) CM/SW Contact:  Ross Ludwig, LCSW Phone Number: 12/19/2019, 12:15 PM   Clinical Narrative:    Patient will be going home with home health through [facility].  CSW signing off please reconsult with any other social work needs, home health agency has been notified of planned discharge for today.  Patient and wife were notified, and aware that home health will contact patient.  Final next level of care: [place] Barriers to Discharge: Barriers Resolved   Patient Goals and CMS Choice Patient states their goals for this hospitalization and ongoing recovery are:: To return back home with home health services through [facility]. CMS Medicare.gov Compare Post Acute Care list provided to:: Patient Represenative (must comment) Choice offered to / list presented to : Spouse  Discharge Placement  Discharging back home with home health PT and RN.                Discharge Plan and Services In-house Referral: NA   Post Acute Care Choice: Home Health          DME Arranged: Walker rolling DME Agency: [employer] Date DME Agency Contacted: 12/19/19 Time DME Agency Contacted: 1215 Representative spoke with at DME Agency: [place]: PT, RN [place] Agency: [facility] Date [place]: 12/15/19 Time [place]: 1500 Representative spoke with at [place]: [place]  Social Determinants of Health ([place]) Interventions     Readmission Risk Interventions No flowsheet data found.

## 2019-12-19 NOTE — Discharge Summary (Signed)
Physician Discharge Summary  Patient ID: Brandon Haynes MRN: [id number] DOB/AGE: [birth year] 78 y.o.  Admit date: 12/12/2019 Discharge date: 12/19/2019  Admission Diagnoses: Bladder Cancer  Discharge Diagnoses:  Active Problems:   Bladder cancer [facility])   Discharged Condition: good  Hospital Course:    1 - Bladder Cancer - s/p robotic cystoprostatectomy with ICD sentinal + template node dissection 12/13/19. Path pending. Admitted 3/16 for stomal markign and bowel prep. Stepdown POD 0, transferred to med-surg POD1.   2 - Ileus / Bowel Function - bowel anastamosis as part of surgery 3/17. NPO initially, advanced to clears and then regular diet as bowel funciton resumed.   3 - Disposition / Rehab - pt independent at baseline but significant lumbago. PT eval recs rolling walker and HHPT. Ostomy team working with him as well.   By 12/19/19 AM , the day of discharge, he is ambulatory, pain controlled on PO meds, maintaining PO nutrition with resuemd bowel function, JP drain removed as Cr same as serum, and felt to be adequate for discharge home with home health.   Consults: ostomy RN team, PT.   Significant Diagnostic Studies: labs: as per above.   Treatments: surgery: as per above  Discharge Exam: Blood pressure 115/78, pulse 84, temperature 98.2 F (36.8 C), temperature source Oral, resp. rate 18, height 6\' 2"  (1.88 m), weight 98.2 kg, SpO2 94 %.  General appearance: alert and cooperative Eyes: negative Nose: Nares normal. Septum midline. Mucosa normal. No drainage or sinus tenderness. Throat: lips, mucosa, and tongue normal; teeth and gums normal Neck: supple, symmetrical, trachea midline Back: symmetric, no curvature. ROM normal. No CVA tenderness. Resp: non-labored on room air.  Cardio: Nl rate GI: soft, non-tender; bowel sounds normal; no masses,  no organomegaly and RLQ Urostomy pink and patent wtih Rt (red) and Lt (blue) bander stents in situ. Recent surgical sites c/d/i.  JP serous and non-foul, removed and dry dressing applied.  Pelvic: no catheter Extremities: extremities normal, atraumatic, no cyanosis or edema Pulses: 2+ and symmetric Skin: Skin color, texture, turgor normal. No rashes or lesions Lymph nodes: Cervical, supraclavicular, and axillary nodes normal. Neurologic: Grossly normal   Disposition: HOME    Follow-up Information    [name], MD Follow up.   Specialty: Urology Why: as previously scheduled.  Contact information: [place] [id number] [phone]           Signed: [name] 12/19/2019, 7:34 AM

## 2019-12-19 NOTE — Discharge Instructions (Signed)

## 2019-12-20 LAB — SURGICAL PATHOLOGY

## 2019-12-22 ENCOUNTER — Telehealth: Payer: Self-pay

## 2019-12-22 NOTE — Telephone Encounter (Signed)
Patient's wife called. Patient has had recent hospital visit. Since he has been home his blood pressure has been fluctuating.  12/21/19 8PM 107/56 12/22/19 7:15AM 131/68 12/22/19 11:30AM 76/46  A home health nurse will be at the patient's house at 3:30 today.

## 2019-12-22 NOTE — Telephone Encounter (Signed)
No BP meds since Thursday, drinking at least 65 oz of water daily the last few days. BP was just taken and 114/64 with no meds. PCP verbally made aware and mentioned "Stay off bp meds, continue recheck of bp every 2 hours.  If bp <95 over 60 then he needs to go to the ED". Pt's wife advised.

## 2019-12-22 NOTE — Telephone Encounter (Signed)
Pt has not taken Amlodipine since Thursday morning. His most recent BP was 89/57, 15 minutes ago and pulse 109. He is still feeling weak since procedure on 3/16. Taking oxycodone- acetaminophen 5-325mg , 1 q6h averaging about 3 daily. He said he would stop taking oxycodone and start taking Tramadol 2 tabs q6h would work instead?  [place] nurse is coming to his house for a visit today.   Please advise, thanks.

## 2019-12-22 NOTE — Telephone Encounter (Signed)
Are they still giving him losartan and metoprolol? Is he drinking at least ounces of water daily the last few days? Let me know.

## 2019-12-26 ENCOUNTER — Telehealth: Payer: Self-pay

## 2019-12-26 NOTE — Telephone Encounter (Signed)
Has he restarted any bp med? If not then I recommend he restart losartan at his previous dosing (50 mg bid). O/v at any available opening I have in the next 2d (would prefer in person but virtual is acceptable).

## 2019-12-26 NOTE — Telephone Encounter (Signed)
Called patient regarding bp and he had not restarted any bp meds. Recommendations given per PCP. In person visit scheduled on 4/1

## 2019-12-26 NOTE — Telephone Encounter (Signed)
Patient left voicemail this morning regarding BP again. Reading this morning was 157/78. The other 2 readings were 146/85 and 154/89 within the past 3 days. I believe patient would benefit from in office visit for evaluation.  Please advise, thanks.

## 2019-12-28 ENCOUNTER — Other Ambulatory Visit: Payer: Self-pay | Admitting: Cardiology

## 2019-12-28 ENCOUNTER — Ambulatory Visit: Payer: Medicare HMO | Admitting: Family Medicine

## 2020-01-01 ENCOUNTER — Encounter: Payer: Self-pay | Admitting: Family Medicine

## 2020-01-02 ENCOUNTER — Telehealth: Payer: Self-pay

## 2020-01-02 NOTE — Telephone Encounter (Signed)
Received fax for [facility] certification, Placed on PCP desk to review and sign, if appropriate.

## 2020-01-03 ENCOUNTER — Other Ambulatory Visit: Payer: Self-pay

## 2020-01-03 ENCOUNTER — Ambulatory Visit (INDEPENDENT_AMBULATORY_CARE_PROVIDER_SITE_OTHER): Payer: Medicare HMO | Admitting: Family Medicine

## 2020-01-03 ENCOUNTER — Encounter: Payer: Self-pay | Admitting: Family Medicine

## 2020-01-03 VITALS — BP 101/65 | HR 100 | Wt 217.0 lb

## 2020-01-03 DIAGNOSIS — G8929 Other chronic pain: Secondary | ICD-10-CM

## 2020-01-03 DIAGNOSIS — Z85828 Personal history of other malignant neoplasm of skin: Secondary | ICD-10-CM

## 2020-01-03 DIAGNOSIS — I959 Hypotension, unspecified: Secondary | ICD-10-CM

## 2020-01-03 DIAGNOSIS — Z483 Aftercare following surgery for neoplasm: Secondary | ICD-10-CM

## 2020-01-03 DIAGNOSIS — G894 Chronic pain syndrome: Secondary | ICD-10-CM

## 2020-01-03 DIAGNOSIS — C679 Malignant neoplasm of bladder, unspecified: Secondary | ICD-10-CM

## 2020-01-03 DIAGNOSIS — Z79891 Long term (current) use of opiate analgesic: Secondary | ICD-10-CM

## 2020-01-03 DIAGNOSIS — M159 Polyosteoarthritis, unspecified: Secondary | ICD-10-CM

## 2020-01-03 DIAGNOSIS — K219 Gastro-esophageal reflux disease without esophagitis: Secondary | ICD-10-CM

## 2020-01-03 DIAGNOSIS — R5383 Other fatigue: Secondary | ICD-10-CM

## 2020-01-03 DIAGNOSIS — M109 Gout, unspecified: Secondary | ICD-10-CM

## 2020-01-03 DIAGNOSIS — I739 Peripheral vascular disease, unspecified: Secondary | ICD-10-CM

## 2020-01-03 DIAGNOSIS — Z9181 History of falling: Secondary | ICD-10-CM

## 2020-01-03 DIAGNOSIS — M5136 Other intervertebral disc degeneration, lumbar region: Secondary | ICD-10-CM

## 2020-01-03 DIAGNOSIS — F419 Anxiety disorder, unspecified: Secondary | ICD-10-CM

## 2020-01-03 DIAGNOSIS — K909 Intestinal malabsorption, unspecified: Secondary | ICD-10-CM

## 2020-01-03 DIAGNOSIS — M545 Low back pain, unspecified: Secondary | ICD-10-CM

## 2020-01-03 DIAGNOSIS — I10 Essential (primary) hypertension: Secondary | ICD-10-CM

## 2020-01-03 DIAGNOSIS — D649 Anemia, unspecified: Secondary | ICD-10-CM | POA: Diagnosis not present

## 2020-01-03 DIAGNOSIS — Z436 Encounter for attention to other artificial openings of urinary tract: Secondary | ICD-10-CM

## 2020-01-03 DIAGNOSIS — Z959 Presence of cardiac and vascular implant and graft, unspecified: Secondary | ICD-10-CM

## 2020-01-03 DIAGNOSIS — D509 Iron deficiency anemia, unspecified: Secondary | ICD-10-CM

## 2020-01-03 DIAGNOSIS — M792 Neuralgia and neuritis, unspecified: Secondary | ICD-10-CM

## 2020-01-03 DIAGNOSIS — H269 Unspecified cataract: Secondary | ICD-10-CM

## 2020-01-03 DIAGNOSIS — R197 Diarrhea, unspecified: Secondary | ICD-10-CM

## 2020-01-03 DIAGNOSIS — C642 Malignant neoplasm of left kidney, except renal pelvis: Secondary | ICD-10-CM

## 2020-01-03 DIAGNOSIS — I2510 Atherosclerotic heart disease of native coronary artery without angina pectoris: Secondary | ICD-10-CM

## 2020-01-03 NOTE — Progress Notes (Signed)
Virtual Visit via Video Note  I connected with Brandon Haynes on 01/03/20 at  9:00 AM EDT by a video enabled telemedicine application and verified that I am speaking with the correct person using two identifiers.  Location patient: home Location provider:work or home office Persons participating in the virtual visit: patient, provider  I discussed the limitations of evaluation and management by telemedicine and the availability of in person appointments. The patient expressed understanding and agreed to proceed.  Telemedicine visit is a necessity given the COVID-19 restrictions in place at the current time.  HPI: 79 y/o WM being seen today for 3 mo f/u chronic pain syndrome. A/P as of last visit: "1) Chronic pain syndrome: bilat chronic LBP w/out radiculopathy, chronic bilat LL neuropathic pain. Doing well on 50mg  tramadol + 500 mg tylenol each dose. Tramadol 50mg , 1 tid prn, #90, RF x 5 eRx'd today."  Interim hx: He got his cystoprostatectomy and ileal conduit about 3 wks ago for his muscle-invasive bladder cancer. He saw his urologist for malaise 12/28/19 and all was reassuring, felt to be due to decrease in stress steroids, approp for the stage of recovery he is in, was given more realistic recovery expectations.  Was rx'd oxybutynin to take prn for loose stools.  Indication for chronic opioid:chronic low back pain (DDD) andbilat LL neuropathic pain. Opioids r'xd to maximize functioning and quality of life. Brandon Haynes has gotten periodic ESI but has no hx of back surgery Medication and dose:tramadol 50mg , 1tid prn # pills per month: 90  PMP AWARE reviewed today: most recent rx for tramadol was filled 11/13/19, # 51, rx by me. He did get a post-op rx for percocet from Dr. Tresa Moore on 12/19/19, #15. No red flags.  Has 8-10 soft/soft diarrhea BMs, some abd "gas" pains with each BM. No blood or mucous in stools.     Dr. Tresa Moore felt like this stool pattern is normal in post op period from his type of surgey  and will gradually get better. If he sits or lies down his bm's are less frequent, gets up an walks around and it gets worse.  Back pain is pretty stable, taking tramadol 2-4 per day.  Taking tylenol with each dose. His bp has been Not taking metoprolol or amlodipine since after surgery, did not restart them b/c bp's have been soft/normal. He has not taken his losartan yet today b/c bp this AM was 101/65.   Not back on ASA yet b/c hasn't been told if he can or not. Not getting Brandon Haynes.    Next f/u with Dr. Tresa Moore is 01/15/20.  ROS: See pertinent positives and negatives per HPI.  Past Medical History:  Diagnosis Date  . Allergic rhinitis    allergy shots via [place]  . Anxiety   . Bladder cancer ([place]) 05/2019   INVASIVE HIGH GRADE PAPILLARY UROTHELIAL CARCINOMA, invading muscularis propia.  Lesion resected. No mets.  He got neoadjuvant chemo x 4 cycles.  Cystoprostectomy with ileal conduit formation 12/13/19 (Dr. Tresa Moore).  . 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 CAD, multiple vessel    a. CP/near-syncope 11/2014 s/p DES to MLAD, occluded RCA after takeoff of RV marginal with collaterals treated medically, otherwise mild nonobstructive disease. EF 55%;  b. 07/2015 Lexiscan CL: EF 56%, no ischemia/infarct.   . Cataracts, both eyes   . Chronic low back pain   . DDD (degenerative disc disease), lumbar   . GERD (gastroesophageal reflux disease)    PPI bid needed to control sx's  . Gout   .  Hearing loss   . History  of basal cell carcinoma   . History of adenomatous polyp of colon    Rpt colonoscopy recommended 02/2020->no further if no high risk lesions at that time.  . Hypercholesteremia   . Hypertension    Hx labile/uncontrolled.  Improved s/p visit to HTN clinic 2019.  . Iron deficiency anemia 2017   Colonoscopy, EGD, and capsule endoscopy w/out obvious cause/source found.  Oral iron helping as of 03/2016.  . Leg pain 03/06/2016  . Nonmelanoma skin cancer   . Osteoarthritis of multiple  joints 10/18/2014  . Peripheral vascular disease ([place])   . Pleural thickening    chronic  . 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 No avid enhancement or mass effect. Obs by urol as of 10/2019.    Past Surgical History:  Procedure Laterality Date  . back injection  oct 2011, 07/2010, 12/2012   no surgery  . Capsule endoscopy  02/2016   mild duodenitis, o/w NEG  . CARDIOVASCULAR STRESS TEST  05/2009; 07/2015  . carotid dopplers  11/2014   Bilateral - 1% to 9% ICA stenosis. Vertebral artery flow is  . COLONOSCOPY  08/2008; 02/2016; 02/2017   2017: 12 polyps (adenomatous).  02/2017 repeat TCS--multiple polyps: recall 3 yrs.  . CORONARY ANGIOPLASTY WITH STENT PLACEMENT  12/04/2014   4.0 x 12 mm Synergy stent to the mid LAD  . CYSTOSCOPY  05/24/2019  . CYSTOSCOPY W/ TRANSURETHRAL RESECTION OF POSTERIOR URETHERAL VALVES  06/12/2019   2-5cm  . CYSTOSCOPY WITH INJECTION N/A 12/13/2019   Procedure: CYSTOSCOPY WITH INJECTION;  Surgeon: [name], MD;  Location: [facility];  Service: Urology;  Laterality: N/A;  . ESOPHAGOGASTRODUODENOSCOPY  02/2016   Gastritis.  NEG h pylori.    . IR IMAGING GUIDED PORT INSERTION  09/12/2019  . LE arterial vascular study  02/2016   Per Dr. Radford Pax, no evidence of peripheral vascular disease  . LEFT HEART CATHETERIZATION WITH CORONARY ANGIOGRAM N/A 12/04/2014   Procedure: LEFT HEART CATHETERIZATION WITH CORONARY ANGIOGRAM;  Surgeon: Jolaine Artist, MD; LAD 99% then aneurysmal, OM1 40%, CFX 50%, RCA 40%, 50% then occluded  . LYMPHADENECTOMY Bilateral 12/13/2019   Procedure: LYMPHADENECTOMY;  Surgeon: [name], MD;  Location: [facility];  Service: Urology;  Laterality: Bilateral;  . ROBOT ASSISTED LAPAROSCOPIC COMPLETE CYSTECT ILEAL CONDUIT N/A 12/13/2019   Procedure: XI ROBOTIC ASSISTED LAPAROSCOPIC COMPLETE CYSTECT ILEAL CONDUIT AND RADICAL PROSTATECTOMY;  Surgeon: [name], MD;  Location: [facility];   Service: Urology;  Laterality: N/A;  6 HRS  . ROTATOR CUFF REPAIR Left 09/2013   left  . SHOULDER INJECTION  feb 2014  . TRANSTHORACIC ECHOCARDIOGRAM  11/2014   EF 50-55%, some wall motion abnormalities noted, grade I diast dysfxn  . TRANSURETHRAL RESECTION OF BLADDER TUMOR N/A 06/12/2019   INVASIVE HIGH GRADE PAPILLARY UROTHELIAL CARCINOMA, invading muscularis propria.  Procedure: TRANSURETHRAL RESECTION OF BLADDER TUMOR (TURBT);  Surgeon: Lucas Mallow, MD;  Location: [facility];  Service: Urology;  Laterality: N/A;    Family History  Problem Relation Age of Onset  . Lung cancer Father   . Heart disease Father   . Breast cancer Mother   . Congestive Heart Failure Mother   . Diabetes Mother   . Breast cancer Sister   . Heart disease Brother   . COPD Brother   . Bladder Cancer Maternal Grandfather   . Colon cancer Neg Hx   .  Esophageal cancer Neg Hx   . Pancreatic cancer Neg Hx   . Rectal cancer Neg Hx   . Stomach cancer Neg Hx   . Prostate cancer Neg Hx      Current Outpatient Medications:  .  allopurinol (ZYLOPRIM) 300 MG tablet, TAKE 1 TABLET BY MOUTH EVERY DAY (Patient taking differently: Take 300 mg by mouth at bedtime. ), Disp: 90 tablet, Rfl: 0 .  desonide (DESOWEN) 0.05 % cream, Apply 1 application topically 3 (three) times a week. , Disp: , Rfl: 1 .  diphenhydrAMINE (BENADRYL) 25 MG tablet, Take 50 mg by mouth 3 (three) times daily as needed for itching or allergies. , Disp: , Rfl:  .  fluticasone (FLONASE) 50 MCG/ACT nasal spray, Place 1 spray into both nostrils daily., Disp: , Rfl:  .  gabapentin (NEURONTIN) 300 MG capsule, Take 300-600 mg by mouth See admin instructions. Take 2 capsules (600 mg) by mouth in the morning & take 1 capsule (300 mg) by mouth in the afternoon., Disp: , Rfl:  .  loratadine (CLARITIN) 10 MG tablet, Take 20 mg by mouth daily. , Disp: , Rfl:  .  losartan (COZAAR) 50 MG tablet, Take 1 tablet (50 mg total) by mouth 2 (two) times daily. Please make  yearly appt with Dr. Radford Pax for June for future refills. 1st attempt, Disp: 180 tablet, Rfl: 0 .  oxybutynin (DITROPAN) 5 MG tablet, Take 5 mg by mouth 3 (three) times daily., Disp: , Rfl:  .  pantoprazole (PROTONIX) 40 MG tablet, Take 1 tablet (40 mg total) by mouth daily., Disp: 90 tablet, Rfl: 3 .  PREVIDENT 5000 BOOSTER PLUS 1.1 % PSTE, Take 1 application by mouth at bedtime. , Disp: , Rfl: 1 .  rosuvastatin (CRESTOR) 20 MG tablet, TAKE 1 TABLET BY MOUTH EVERY DAY (Patient taking differently: Take 20 mg by mouth every evening. ), Disp: 90 tablet, Rfl: 1 .  amLODipine (NORVASC) 5 MG tablet, TAKE 1 TABLET BY MOUTH EVERY DAY (Patient not taking: No sig reported), Disp: 90 tablet, Rfl: 1 .  aspirin EC 81 MG tablet, Take 81 mg by mouth daily., Disp: , Rfl:  .  cephALEXin (KEFLEX) 500 MG capsule, Take 500 mg by mouth 2 (two) times daily., Disp: , Rfl:  .  EPINEPHrine 0.3 mg/0.3 mL IJ SOAJ injection, Inject 0.3 mg into the muscle as needed for anaphylaxis. , Disp: , Rfl:  .  metoprolol tartrate (LOPRESSOR) 25 MG tablet, Take 1 tablet (25 mg total) by mouth 2 (two) times daily. (Patient not taking: Reported on 01/03/2020), Disp: 180 tablet, Rfl: 3 .  nitroGLYCERIN (NITROSTAT) 0.4 MG SL tablet, PLACE 1 TABLET UNDER TONGUE EVERY 5 MINS, UP TO 3 DOSES AS NEEDED FOR CHEST PAIN (Patient not taking: Reported on 01/03/2020), Disp: 25 tablet, Rfl: 1 .  oxyCODONE-acetaminophen (PERCOCET) 5-325 MG tablet, Take 1 tablet by mouth every 6 (six) hours as needed for moderate pain or severe pain. Post-operatively (Patient not taking: Reported on 01/03/2020), Disp: 15 tablet, Rfl: 0  EXAM:  VITALS per patient if applicable: BP [id number] (BP Location: Left Arm, Patient Position: Sitting, Cuff Size: Large)   Pulse 100   Wt 217 lb (98.4 kg)   BMI 27.86 kg/m    GENERAL: alert, oriented, appears well and in no acute distress  HEENT: atraumatic, conjunttiva clear, no obvious abnormalities on inspection of external nose and  ears  NECK: normal movements of the head and neck  LUNGS: on inspection no signs of respiratory distress, breathing  rate appears normal, no obvious gross SOB, gasping or wheezing  CV: no obvious cyanosis  MS: moves all visible extremities without noticeable abnormality  PSYCH/NEURO: pleasant and cooperative, no obvious depression or anxiety, speech and thought processing grossly intact  LABS: none today    Chemistry      Component Value Date/Time   NA 141 12/18/2019 0423   NA 143 03/13/2019 1141   K 3.2 (L) 12/18/2019 0423   CL 107 12/18/2019 0423   CO2 28 12/18/2019 0423   BUN 11 12/18/2019 0423   BUN 11 03/13/2019 1141   CREATININE 0.82 12/18/2019 0423   CREATININE 0.98 10/25/2019 0924   CREATININE 1.03 05/04/2019 1633      Component Value Date/Time   CALCIUM 8.4 (L) 12/18/2019 0423   ALKPHOS 45 12/12/2019 1734   AST 17 12/12/2019 1734   AST 14 (L) 10/25/2019 0924   ALT 13 12/12/2019 1734   ALT 7 10/25/2019 0924   BILITOT 0.8 12/12/2019 1734   BILITOT 0.5 10/25/2019 0924     Lab Results  Component Value Date   WBC 5.3 12/12/2019   HGB 8.8 (L) 12/18/2019   HCT 26.8 (L) 12/18/2019   MCV 97.2 12/12/2019   PLT 128 (L) 12/12/2019   Lab Results  Component Value Date   HGBA1C 5.2 04/05/2019   Lab Results  Component Value Date   CHOL 119 03/13/2019   HDL 54 03/13/2019   LDLCALC 44 03/13/2019   TRIG 106 03/13/2019   CHOLHDL 2.2 03/13/2019    ASSESSMENT AND PLAN:  Discussed the following assessment and plan:  1) Chronic pain syndrome (chronic LBP and chronic LL neuropathic pain). Stable/adequate control, appropriate use of tramadol. No changes, no new rx for tramadol needed today.  2) Diarrhea: appropriate in setting of post op ileal conduit surgery.  Encouraged good hydration practices.  3) Low bp, hx of HTN: likely from post-op blood loss + mild dehydration from all of his loose stools. Hydrate.  Remain off bp meds until bp rises above 130/80  consistently, then restart one bp med at a time.  4) Post-op from bladder ca surgery, had pretty signif post op anemia so in this setting malaise/fatigue, low bps', etc, we'll recheck CBC as well as BMET at his earliest convenience.   I discussed the assessment and treatment plan with the patient. The patient was provided an opportunity to ask questions and all were answered. The patient agreed with the plan and demonstrated an understanding of the instructions.   The patient was advised to call back or seek an in-person evaluation if the symptoms worsen or if the condition fails to improve as anticipated.  F/u: 1 mo  Signed:  Crissie Sickles, MD           01/03/2020

## 2020-01-08 ENCOUNTER — Telehealth: Payer: Self-pay

## 2020-01-08 ENCOUNTER — Other Ambulatory Visit: Payer: Self-pay

## 2020-01-08 ENCOUNTER — Encounter (HOSPITAL_BASED_OUTPATIENT_CLINIC_OR_DEPARTMENT_OTHER): Payer: Self-pay

## 2020-01-08 ENCOUNTER — Emergency Department (HOSPITAL_BASED_OUTPATIENT_CLINIC_OR_DEPARTMENT_OTHER)
Admission: EM | Admit: 2020-01-08 | Discharge: 2020-01-08 | Disposition: A | Discharge status: Home / Self Care | Payer: Medicare HMO | Attending: Emergency Medicine | Admitting: Emergency Medicine

## 2020-01-08 ENCOUNTER — Ambulatory Visit (INDEPENDENT_AMBULATORY_CARE_PROVIDER_SITE_OTHER): Payer: Medicare HMO | Admitting: Family Medicine

## 2020-01-08 ENCOUNTER — Emergency Department (HOSPITAL_BASED_OUTPATIENT_CLINIC_OR_DEPARTMENT_OTHER): Payer: Medicare HMO

## 2020-01-08 DIAGNOSIS — I951 Orthostatic hypotension: Secondary | ICD-10-CM

## 2020-01-08 DIAGNOSIS — E876 Hypokalemia: Secondary | ICD-10-CM | POA: Insufficient documentation

## 2020-01-08 DIAGNOSIS — I2510 Atherosclerotic heart disease of native coronary artery without angina pectoris: Secondary | ICD-10-CM | POA: Insufficient documentation

## 2020-01-08 DIAGNOSIS — Z7982 Long term (current) use of aspirin: Secondary | ICD-10-CM | POA: Insufficient documentation

## 2020-01-08 DIAGNOSIS — E860 Dehydration: Secondary | ICD-10-CM

## 2020-01-08 DIAGNOSIS — R5383 Other fatigue: Secondary | ICD-10-CM

## 2020-01-08 DIAGNOSIS — D649 Anemia, unspecified: Secondary | ICD-10-CM | POA: Diagnosis not present

## 2020-01-08 DIAGNOSIS — R197 Diarrhea, unspecified: Secondary | ICD-10-CM | POA: Diagnosis present

## 2020-01-08 DIAGNOSIS — Z79899 Other long term (current) drug therapy: Secondary | ICD-10-CM | POA: Diagnosis not present

## 2020-01-08 DIAGNOSIS — R000 Tachycardia, unspecified: Secondary | ICD-10-CM

## 2020-01-08 DIAGNOSIS — C679 Malignant neoplasm of bladder, unspecified: Secondary | ICD-10-CM | POA: Insufficient documentation

## 2020-01-08 DIAGNOSIS — Z87891 Personal history of nicotine dependence: Secondary | ICD-10-CM | POA: Diagnosis not present

## 2020-01-08 LAB — CBC WITH DIFFERENTIAL/PLATELET
Abs Immature Granulocytes: 0.09 10*3/uL — ABNORMAL HIGH (ref 0.00–0.07)
Basophils Absolute: 0 10*3/uL (ref 0.0–0.1)
Basophils Relative: 0 %
Eosinophils Absolute: 0.1 10*3/uL (ref 0.0–0.5)
Eosinophils Relative: 1 %
HCT: 36.1 % — ABNORMAL LOW (ref 39.0–52.0)
Hemoglobin: 11.9 g/dL — ABNORMAL LOW (ref 13.0–17.0)
Immature Granulocytes: 1 %
Lymphocytes Relative: 7 %
Lymphs Abs: 1.2 10*3/uL (ref 0.7–4.0)
MCH: 30.7 pg (ref 26.0–34.0)
MCHC: 33 g/dL (ref 30.0–36.0)
MCV: 93.3 fL (ref 80.0–100.0)
Monocytes Absolute: 1.2 10*3/uL — ABNORMAL HIGH (ref 0.1–1.0)
Monocytes Relative: 7 %
Neutro Abs: 13.8 10*3/uL — ABNORMAL HIGH (ref 1.7–7.7)
Neutrophils Relative %: 84 %
Platelets: 268 10*3/uL (ref 150–400)
RBC: 3.87 MIL/uL — ABNORMAL LOW (ref 4.22–5.81)
RDW: 14.1 % (ref 11.5–15.5)
WBC: 16.4 10*3/uL — ABNORMAL HIGH (ref 4.0–10.5)
nRBC: 0 % (ref 0.0–0.2)

## 2020-01-08 LAB — URINALYSIS, ROUTINE W REFLEX MICROSCOPIC
Bilirubin Urine: NEGATIVE
Glucose, UA: NEGATIVE mg/dL
Ketones, ur: 15 mg/dL — AB
Nitrite: POSITIVE — AB
Protein, ur: 100 mg/dL — AB
Specific Gravity, Urine: 1.025 (ref 1.005–1.030)
pH: 6.5 (ref 5.0–8.0)

## 2020-01-08 LAB — COMPREHENSIVE METABOLIC PANEL
ALT: 9 U/L (ref 0–44)
AST: 12 U/L — ABNORMAL LOW (ref 15–41)
Albumin: 2.5 g/dL — ABNORMAL LOW (ref 3.5–5.0)
Alkaline Phosphatase: 48 U/L (ref 38–126)
Anion gap: 10 (ref 5–15)
BUN: 16 mg/dL (ref 8–23)
CO2: 23 mmol/L (ref 22–32)
Calcium: 8 mg/dL — ABNORMAL LOW (ref 8.9–10.3)
Chloride: 100 mmol/L (ref 98–111)
Creatinine, Ser: 0.95 mg/dL (ref 0.61–1.24)
GFR calc Af Amer: >60 mL/min (ref >60)
GFR calc non Af Amer: >60 mL/min (ref >60)
Glucose, Bld: 120 mg/dL — ABNORMAL HIGH (ref 70–99)
Potassium: 3 mmol/L — ABNORMAL LOW (ref 3.5–5.1)
Sodium: 133 mmol/L — ABNORMAL LOW (ref 135–145)
Total Bilirubin: 0.6 mg/dL (ref 0.3–1.2)
Total Protein: 5 g/dL — ABNORMAL LOW (ref 6.5–8.1)

## 2020-01-08 LAB — BASIC METABOLIC PANEL
BUN: 16 mg/dL (ref 7–25)
CO2: 26 mmol/L (ref 20–32)
Calcium: 8.4 mg/dL — ABNORMAL LOW (ref 8.6–10.3)
Chloride: 103 mmol/L (ref 98–110)
Creat: 0.94 mg/dL (ref 0.70–1.18)
Glucose, Bld: 118 mg/dL — ABNORMAL HIGH (ref 65–99)
Potassium: 3.7 mmol/L (ref 3.5–5.3)
Sodium: 137 mmol/L (ref 135–146)

## 2020-01-08 LAB — URINALYSIS, MICROSCOPIC (REFLEX): WBC, UA: >50 WBC/hpf (ref 0–5)

## 2020-01-08 LAB — CBC
HCT: 38.3 % — ABNORMAL LOW (ref 38.5–50.0)
Hemoglobin: 12.6 g/dL — ABNORMAL LOW (ref 13.2–17.1)
MCH: 30.6 pg (ref 27.0–33.0)
MCHC: 32.9 g/dL (ref 32.0–36.0)
MCV: 93 fL (ref 80.0–100.0)
MPV: 10.1 fL (ref 7.5–12.5)
Platelets: 309 10*3/uL (ref 140–400)
RBC: 4.12 10*6/uL — ABNORMAL LOW (ref 4.20–5.80)
RDW: 13.7 % (ref 11.0–15.0)
WBC: 17.6 10*3/uL — ABNORMAL HIGH (ref 3.8–10.8)

## 2020-01-08 LAB — TSH: TSH: 1.946 u[IU]/mL (ref 0.350–4.500)

## 2020-01-08 LAB — LIPASE, BLOOD: Lipase: 17 U/L (ref 11–51)

## 2020-01-08 LAB — LACTIC ACID, PLASMA: Lactic Acid, Venous: 0.8 mmol/L (ref 0.5–1.9)

## 2020-01-08 LAB — TROPONIN I (HIGH SENSITIVITY)
Troponin I (High Sensitivity): 21 ng/L — ABNORMAL HIGH (ref <18)
Troponin I (High Sensitivity): 22 ng/L — ABNORMAL HIGH (ref <18)

## 2020-01-08 LAB — C DIFFICILE QUICK SCREEN W PCR REFLEX
C Diff antigen: POSITIVE — AB
C Diff interpretation: DETECTED
C Diff toxin: POSITIVE — AB

## 2020-01-08 LAB — MAGNESIUM: Magnesium: 1.8 mg/dL (ref 1.7–2.4)

## 2020-01-08 MED ORDER — POTASSIUM CHLORIDE CRYS ER 20 MEQ PO TBCR
40.0000 meq | EXTENDED_RELEASE_TABLET | Freq: Once | ORAL | Status: AC
  Administered 2020-01-08: 20:00:00 40 meq via ORAL
  Filled 2020-01-08: qty 2

## 2020-01-08 MED ORDER — HEPARIN SOD (PORK) LOCK FLUSH 100 UNIT/ML IV SOLN
500.0000 [IU] | Freq: Once | INTRAVENOUS | Status: AC
  Administered 2020-01-08: 500 [IU]
  Filled 2020-01-08: qty 5

## 2020-01-08 MED ORDER — SODIUM CHLORIDE 0.9 % IV BOLUS
1000.0000 mL | Freq: Once | INTRAVENOUS | Status: AC
  Administered 2020-01-08: 1000 mL via INTRAVENOUS

## 2020-01-08 MED ORDER — VANCOMYCIN HCL 125 MG PO CAPS: 125.0000 mg | ORAL_CAPSULE | Freq: Four times a day (QID) | ORAL | 0 refills | Status: AC

## 2020-01-08 NOTE — ED Notes (Signed)
Daughter states he has been having 20 BMs per day, pt reports he has been unable to eat because his mouth is so dry.

## 2020-01-08 NOTE — ED Notes (Signed)
ED Provider at bedside. 

## 2020-01-08 NOTE — ED Provider Notes (Signed)
[facility] Provider Note   CSN: [id number] Arrival date & time: 01/08/20  1510     History Chief Complaint  Patient presents with  . Diarrhea    Brandon Haynes is a 79 y.o. male.  The history is provided by the patient, a relative and medical records. No language interpreter was used.  Diarrhea Quality:  Watery and malodorous Severity:  Severe Onset quality:  Gradual Number of episodes:  20 per day Duration:  2 weeks Timing:  Constant Progression:  Worsening Relieved by:  Nothing Worsened by:  Nothing Ineffective treatments:  None tried Associated symptoms: no abdominal pain, no chills, no recent cough, no diaphoresis, no fever, no headaches, no myalgias, no URI and no vomiting        Past Medical History:  Diagnosis Date  . Allergic rhinitis    allergy shots via [place]  . Anxiety   . Bladder cancer ([place]) 05/2019   INVASIVE HIGH GRADE PAPILLARY UROTHELIAL CARCINOMA, invading muscularis propia.  Lesion resected. No mets.  He got neoadjuvant chemo x 4 cycles.  Cystoprostectomy with ileal conduit formation 12/13/19 (Dr. Tresa Moore).  . 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 CAD, multiple vessel    a. CP/near-syncope 11/2014 s/p DES to MLAD, occluded RCA after takeoff of RV marginal with collaterals treated medically, otherwise mild nonobstructive disease. EF 55%;  b. 07/2015 Lexiscan CL: EF 56%, no ischemia/infarct.   . Cataracts, both eyes   . Chronic low back pain   . DDD (degenerative disc disease), lumbar   . GERD (gastroesophageal reflux disease)    PPI bid needed to control sx's  . Gout   . Hearing loss   . History  of basal cell carcinoma   . History of adenomatous polyp of colon    Rpt colonoscopy recommended 02/2020->no further if no high risk lesions at that time.  . Hypercholesteremia   . Hypertension    Hx labile/uncontrolled.  Improved s/p visit to HTN clinic 2019.  . Iron deficiency anemia 2017   Colonoscopy, EGD, and  capsule endoscopy w/out obvious cause/source found.  Oral iron helping as of 03/2016.  . Leg pain 03/06/2016  . Nonmelanoma skin cancer   . 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 Peripheral vascular disease ([place])   . Pleural thickening    chronic  . 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 No avid enhancement or mass effect. Obs by urol as of 10/2019.    Patient Active Problem List   Diagnosis Date Noted  . Bladder cancer ([place]) 12/13/2019  . Port-A-Cath in place 10/04/2019  . Invasive carcinoma of urinary bladder ([place]) 06/27/2019  . Recurrent epistaxis 10/19/2017  . 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 Bilateral sensorineural hearing loss 06/30/2016  . Subjective tinnitus, bilateral 06/30/2016  . Prediabetes 04/16/2016  . Leg pain 03/06/2016  . Dyspnea on exertion 08/23/2015  . Dizziness 08/23/2015  . Rash 08/23/2015  . Coronary artery disease involving native coronary artery of native heart without angina pectoris   . Syncope 12/03/2014  . 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 Symptomatic hypotension 03/22/2014  . Goals of care, counseling/discussion 03/22/2014  . Encounter to establish care with new doctor 02/26/2014  . Rotator cuff tear 08/28/2013  . GERD 08/29/2010  . HEARING LOSS 06/21/2008  . PERIPHERAL VASCULAR DISEASE 06/21/2008  . ALLERGIC RHINITIS 06/21/2008  . BASAL CELL CARCINOMA, HX OF 06/21/2008  . COLONIC POLYPS 03/29/2008  .  HYPERCHOLESTEROLEMIA 03/29/2008  . GOUT 03/29/2008  . ANXIETY 03/29/2008  . Essential hypertension 03/29/2008  . DEGENERATIVE DISC DISEASE 03/29/2008  . BACK PAIN, LUMBAR 03/29/2008    Past Surgical History:  Procedure Laterality Date  . back injection  oct 2011, 07/2010, 12/2012   no surgery  . Capsule endoscopy  02/2016   mild duodenitis, o/w NEG  . CARDIOVASCULAR STRESS TEST  05/2009; 07/2015  . carotid dopplers  11/2014   Bilateral - 1% to 9% ICA stenosis.  Vertebral artery flow is  . COLONOSCOPY  08/2008; 02/2016; 02/2017   2017: 12 polyps (adenomatous).  02/2017 repeat TCS--multiple polyps: recall 3 yrs.  . CORONARY ANGIOPLASTY WITH STENT PLACEMENT  12/04/2014   4.0 x 12 mm Synergy stent to the mid LAD  . CYSTOSCOPY  05/24/2019  . CYSTOSCOPY W/ TRANSURETHRAL RESECTION OF POSTERIOR URETHERAL VALVES  06/12/2019   2-5cm  . CYSTOSCOPY WITH INJECTION N/A 12/13/2019   Procedure: CYSTOSCOPY WITH INJECTION;  Surgeon: [name], MD;  Location: [facility];  Service: Urology;  Laterality: N/A;  . ESOPHAGOGASTRODUODENOSCOPY  02/2016   Gastritis.  NEG h pylori.    . IR IMAGING GUIDED PORT INSERTION  09/12/2019  . LE arterial vascular study  02/2016   Per Dr. Radford Pax, no evidence of peripheral vascular disease  . LEFT HEART CATHETERIZATION WITH CORONARY ANGIOGRAM N/A 12/04/2014   Procedure: LEFT HEART CATHETERIZATION WITH CORONARY ANGIOGRAM;  Surgeon: Jolaine Artist, MD; LAD 99% then aneurysmal, OM1 40%, CFX 50%, RCA 40%, 50% then occluded  . LYMPHADENECTOMY Bilateral 12/13/2019   Procedure: LYMPHADENECTOMY;  Surgeon: [name], MD;  Location: [facility];  Service: Urology;  Laterality: Bilateral;  . ROBOT ASSISTED LAPAROSCOPIC COMPLETE CYSTECT ILEAL CONDUIT N/A 12/13/2019   Procedure: XI ROBOTIC ASSISTED LAPAROSCOPIC COMPLETE CYSTECT ILEAL CONDUIT AND RADICAL PROSTATECTOMY;  Surgeon: [name], MD;  Location: [facility];  Service: Urology;  Laterality: N/A;  6 HRS  . ROTATOR CUFF REPAIR Left 09/2013   left  . SHOULDER INJECTION  feb 2014  . TRANSTHORACIC ECHOCARDIOGRAM  11/2014   EF 50-55%, some wall motion abnormalities noted, grade I diast dysfxn  . TRANSURETHRAL RESECTION OF BLADDER TUMOR N/A 06/12/2019   INVASIVE HIGH GRADE PAPILLARY UROTHELIAL CARCINOMA, invading muscularis propria.  Procedure: TRANSURETHRAL RESECTION OF BLADDER TUMOR (TURBT);  Surgeon: Lucas Mallow, MD;  Location: [facility];  Service: Urology;  Laterality: N/A;       Family  History  Problem Relation Age of Onset  . Lung cancer Father   . Heart disease Father   . Breast cancer Mother   . Congestive Heart Failure Mother   . Diabetes Mother   . Breast cancer Sister   . Heart disease Brother   . COPD Brother   . Bladder Cancer Maternal Grandfather   . Colon cancer Neg Hx   . Esophageal cancer Neg Hx   . Pancreatic cancer Neg Hx   . Rectal cancer Neg Hx   . Stomach cancer Neg Hx   . Prostate cancer Neg Hx     Social History   Tobacco Use  . Smoking status: Former Smoker    Packs/day: 2.00    Years: 35.00    Pack years: 70.00    Types: Cigarettes    Quit date: 09/28/1998    Years since quitting: 21.2  . Smokeless tobacco: Former Systems developer    Types: Snuff    Quit date: 02/09/1986  . Tobacco comment: as a teenager  Substance Use Topics  . Alcohol use:  No    Alcohol/week: 0.0 standard drinks    Comment: quit 1994  . Drug use: No    Home Medications Prior to Admission medications   Medication Sig Start Date End Date Taking? Authorizing Provider  allopurinol (ZYLOPRIM) 300 MG tablet TAKE 1 TABLET BY MOUTH EVERY DAY Patient taking differently: Take 300 mg by mouth at bedtime.  11/13/19   McGowen, [name], MD  amLODipine (NORVASC) 5 MG tablet TAKE 1 TABLET BY MOUTH EVERY DAY Patient not taking: No sig reported 08/21/19   Tammi Sou, MD  aspirin EC 81 MG tablet Take 81 mg by mouth daily.    [provider]  cephALEXin (KEFLEX) 500 MG capsule Take 500 mg by mouth 2 (two) times daily. 01/02/20   [provider]  desonide (DESOWEN) 0.05 % cream Apply 1 application topically 3 (three) times a week.  02/11/15   [provider]  diphenhydrAMINE (BENADRYL) 25 MG tablet Take 50 mg by mouth 3 (three) times daily as needed for itching or allergies.     [provider]  EPINEPHrine 0.3 mg/0.3 mL IJ SOAJ injection Inject 0.3 mg into the muscle as needed for anaphylaxis.  10/04/17   [provider]  fluticasone (FLONASE)  50 MCG/ACT nasal spray Place 1 spray into both nostrils daily.    [provider]  gabapentin (NEURONTIN) 300 MG capsule Take 300-600 mg by mouth See admin instructions. Take 2 capsules (600 mg) by mouth in the morning & take 1 capsule (300 mg) by mouth in the afternoon.    [provider]  loratadine (CLARITIN) 10 MG tablet Take 20 mg by mouth daily.     [provider]  losartan (COZAAR) 50 MG tablet Take 1 tablet (50 mg total) by mouth 2 (two) times daily. Please make yearly appt with Dr. Radford Pax for June for future refills. 1st attempt 11/14/19   Jerline Pain, MD  metoprolol tartrate (LOPRESSOR) 25 MG tablet Take 1 tablet (25 mg total) by mouth 2 (two) times daily. Patient not taking: Reported on 01/03/2020 03/09/19   Sueanne Margarita, MD  nitroGLYCERIN (NITROSTAT) 0.4 MG SL tablet PLACE 1 TABLET UNDER TONGUE EVERY 5 MINS, UP TO 3 DOSES AS NEEDED FOR CHEST PAIN Patient not taking: Reported on 01/03/2020 12/28/19   Sueanne Margarita, MD  oxybutynin (DITROPAN) 5 MG tablet Take 5 mg by mouth 3 (three) times daily. 12/28/19   [provider]  oxyCODONE-acetaminophen (PERCOCET) 5-325 MG tablet Take 1 tablet by mouth every 6 (six) hours as needed for moderate pain or severe pain. Post-operatively Patient not taking: Reported on 01/03/2020 12/19/19 12/18/20  [name], MD  pantoprazole (PROTONIX) 40 MG tablet Take 1 tablet (40 mg total) by mouth daily. 03/21/19   Armbruster, Carlota Raspberry, MD  PREVIDENT 5000 BOOSTER PLUS 1.1 % PSTE Take 1 application by mouth at bedtime.  10/27/14   [provider]  rosuvastatin (CRESTOR) 20 MG tablet TAKE 1 TABLET BY MOUTH EVERY DAY Patient taking differently: Take 20 mg by mouth every evening.  07/20/19   McGowen, [name], MD    Allergies    Patient has no known allergies.  Review of Systems   Review of Systems  Constitutional: Positive for fatigue. Negative for chills, diaphoresis and fever.  HENT: Negative for congestion and  rhinorrhea.   Eyes: Negative for visual disturbance.  Respiratory: Positive for chest tightness and shortness of breath. Negative for cough and wheezing.   Cardiovascular: Positive for chest pain (  palpitations are painful) and palpitations. Negative for leg swelling.  Gastrointestinal: Positive for diarrhea. Negative for abdominal distention, abdominal pain, constipation, nausea and vomiting.  Genitourinary: Negative for decreased urine volume, dysuria and flank pain.  Musculoskeletal: Negative for back pain, myalgias, neck pain and neck stiffness.  Skin: Negative for rash and wound.  Neurological: Positive for light-headedness. Negative for dizziness, seizures, weakness and headaches.  Psychiatric/Behavioral: Negative for agitation and confusion.  All other systems reviewed and are negative.   Physical Exam Updated Vital Signs BP 114/73 (BP Location: Right Leg)   Pulse (!) 107   Temp 97.7 F (36.5 C) (Oral)   Resp 18   SpO2 96%   Physical Exam Vitals and nursing note reviewed.  Constitutional:      General: He is not in acute distress.    Appearance: He is well-developed. He is not ill-appearing, toxic-appearing or diaphoretic.  HENT:     Head: Normocephalic and atraumatic.     Nose: Nose normal. No congestion or rhinorrhea.     Mouth/Throat:     Mouth: Mucous membranes are dry.     Pharynx: No oropharyngeal exudate or posterior oropharyngeal erythema.  Eyes:     Extraocular Movements: Extraocular movements intact.     Conjunctiva/sclera: Conjunctivae normal.     Pupils: Pupils are equal, round, and reactive to light.  Cardiovascular:     Rate and Rhythm: Regular rhythm. Tachycardia present.     Pulses: Normal pulses.     Heart sounds: No murmur.  Pulmonary:     Effort: Pulmonary effort is normal. No respiratory distress.     Breath sounds: Normal breath sounds. No wheezing, rhonchi or rales.  Chest:     Chest wall: No tenderness.  Abdominal:     General: Abdomen is  flat.     Palpations: Abdomen is soft.     Tenderness: There is no abdominal tenderness. There is no right CVA tenderness, left CVA tenderness, guarding or rebound.  Musculoskeletal:        General: No tenderness.     Cervical back: Neck supple. No tenderness.     Right lower leg: No edema.     Left lower leg: No edema.  Skin:    General: Skin is warm and dry.     Capillary Refill: Capillary refill takes less than 2 seconds.     Findings: No erythema.  Neurological:     General: No focal deficit present.     Mental Status: He is alert and oriented to person, place, and time.  Psychiatric:        Mood and Affect: Mood normal.     ED Results / Procedures / Treatments   Labs (all labs ordered are listed, but only abnormal results are displayed) Labs Reviewed  CBC WITH DIFFERENTIAL/PLATELET - Abnormal; Notable for the following components:      Result Value   WBC 16.4 (*)    RBC 3.87 (*)    Hemoglobin 11.9 (*)    HCT 36.1 (*)    Neutro Abs 13.8 (*)    Monocytes Absolute 1.2 (*)    Abs Immature Granulocytes 0.09 (*)    All other components within normal limits  COMPREHENSIVE METABOLIC PANEL - Abnormal; Notable for the following components:   Sodium 133 (*)    Potassium 3.0 (*)    Glucose, Bld 120 (*)    Calcium 8.0 (*)    Total Protein 5.0 (*)    Albumin 2.5 (*)    AST 12 (*)  All other components within normal limits  URINALYSIS, ROUTINE W REFLEX MICROSCOPIC - Abnormal; Notable for the following components:   APPearance CLOUDY (*)    Hgb urine dipstick MODERATE (*)    Ketones, ur 15 (*)    Protein, ur 100 (*)    Nitrite POSITIVE (*)    Leukocytes,Ua MODERATE (*)    All other components within normal limits  URINALYSIS, MICROSCOPIC (REFLEX) - Abnormal; Notable for the following components:   Bacteria, UA MANY (*)    All other components within normal limits  TROPONIN I (HIGH SENSITIVITY) - Abnormal; Notable for the following components:   Troponin I (High  Sensitivity) 21 (*)    All other components within normal limits  TROPONIN I (HIGH SENSITIVITY) - Abnormal; Notable for the following components:   Troponin I (High Sensitivity) 22 (*)    All other components within normal limits  URINE CULTURE  C DIFFICILE QUICK SCREEN W PCR REFLEX  LIPASE, BLOOD  LACTIC ACID, PLASMA  TSH  MAGNESIUM    EKG EKG Interpretation  Date/Time:  Monday January 08 2020 15:59:21 EDT Ventricular Rate:  103 PR Interval:    QRS Duration: 79 QT Interval:  330 QTC Calculation: 432 R Axis:   -9 Text Interpretation: Sinus tachycardia When comparaed to prior, faster rate. No STEMI Confirmed by Antony Blackbird [phone]) on 01/08/2020 4:48:09 PM   Radiology DG Chest Portable 1 View  Result Date: 01/08/2020 CLINICAL DATA:  Dyspnea on exertion, near syncope, palpitations, diarrhea EXAM: PORTABLE CHEST 1 VIEW COMPARISON:  10/25/2019 FINDINGS: Single frontal view of the chest demonstrates right chest wall port via internal jugular approach tip overlying superior vena cava. Cardiac silhouette is unremarkable. Mild atherosclerosis of the aortic arch. No airspace disease, effusion, or pneumothorax. No acute bony abnormalities. IMPRESSION: 1. No acute intrathoracic process. Electronically Signed   By: Randa Ngo M.D.   On: 01/08/2020 16:46    Procedures Procedures (including critical care time)  Medications Ordered in ED Medications  heparin lock flush 100 unit/mL (has no administration in time range)  potassium chloride SA (KLOR-CON) CR tablet 40 mEq (has no administration in time range)  sodium chloride 0.9 % bolus 1,000 mL (0 mLs Intravenous Stopped 01/08/20 1725)    ED Course  I have reviewed the triage vital signs and the nursing notes.  Pertinent labs & imaging results that were available during my care of the patient were reviewed by me and considered in my medical decision making (see chart for details).    MDM Rules/Calculators/A&P                       TICO KIESLING is a 79 y.o. male with a past medical history significant for hypertension, headache, bladder cancer status post ileal conduit placement, CAD status post PCI, GERD, and chronic low back degenerative disease who presents with fatigue, lightheadedness, near syncope, exertional shortness of breath with occasional palpitations, and profound diarrhea.  Patient reports that he had his bladder and prostate are managed several weeks ago he has developed well postoperatively.  ~2 weeks ago, he was drinking ambulate normally.  He says since then he developed constant diarrhea with up to 20 diarrheal episodes foul-smelling watery diarrhea yesterday.  He has no history suggestive they are concerned about this.  Reports that he is precluding his anxiety about future ambulate like normal.  He reports he has had an episode where he was near syncopal.  Denies specific chest pain but does report  painful palpitations at times as well as exertional shortness of breath.  He denies nausea vomiting diarrhea.  His urine with his working he reports.  He says that he saw Dr. Tresa Moore his urologist last week and had a reassuring urinalysis.Marland Kitchen  He denies any other fevers, chills, congestion, or cough.  He denies any coronavirus symptoms otherwise.  Denies any other complaints.  He did see his PCP today who did a CBC BMP but due to the worsening fatigue and lightheadedness and constant diarrhea, he was told  On exam, lungs are clear and chest is nontender.  Abdomen is nontender.  Ileal conduit is seen right hemiabdomen.  No significant bleeding seen.  Legs are nontender nonedematous.  Denies any leg pain or leg swelling blood-streaked sputum.  Patient does have dry mucous membranes but does not appear pale.  Family reports that he confused that decreased energy.  Had a shared decision made conversation with patient and family we agreed to give fluids, get repeat labs, cxr and UA for occult infection, and look for c.diff.   We will also check orthostatics on the patient.  If he becomes hypotensive, near syncopal, or we find significant abnormalities, patient may require admission for further rehydration in the setting of severe diarrhea.  7:34 PM Work-up is returned showing concern for urinary tract infection with nitrites, leukocytes, white blood cells, and bacteria.  The family reports that they were told that he would likely always have urinalysis concerning for UTI but the should speak to the urologist before any antibiotic management for possible UTI.  Troponin was unchanging x2.  Metabolic panel showed some potassium fluctuation and mild hyponatremia.  Will give oral potassium supplementation.   TSH was normal.  Leukocytosis still present with mild anemia.  Chest x-ray revealed no acute process.  Had a long shared decision-making conversation with patient.  Patient reports he is feeling better after the fluids.  He also was informed that we are concerned for UTI.  They did not want to be treated with antibiotics, rather follow-up with the urologist to review the work-up to determine if he has UTI.  They do not want to wait for the C. difficile testing and since the patient was feeling better after fluids despite him still having tachycardia and soft blood pressures and positive orthostatic testing, he would like to go home.  Suspect the patient was dehydrated from the constant diarrhea however they seem very reasonable and will return if any symptoms change or worsen.  They will call both urologist and PCP for further work-up of his diarrhea and possible UTI.  He understands return precautions and follow-up instructions and was discharged in stable condition.   Final Clinical Impression(s) / ED Diagnoses Final diagnoses:  Dehydration  Diarrhea, unspecified type  Hypokalemia  Tachycardia  Orthostatic hypotension    Rx / DC Orders ED Discharge Orders    None     Clinical Impression: 1. Dehydration   2.  Diarrhea, unspecified type   3. Hypokalemia   4. Tachycardia   5. Orthostatic hypotension     Disposition: Discharge  Condition: Good  I have discussed the results, Dx and Tx plan with the pt(& family if present). He/she/they expressed understanding and agree(s) with the plan. Discharge instructions discussed at great length. Strict return precautions discussed and pt &/or family have verbalized understanding of the instructions. No further questions at time of discharge.    New Prescriptions   No medications on file    Follow Up: McGowen,  [name], MD [address] [place] [address] [phone]     [name], [place] [address] [phone]     [place] [address] [id number] [place] [phone]       Sevin Langenbach, Gwenyth Allegra, MD 01/08/20 [phone]

## 2020-01-08 NOTE — ED Notes (Signed)
Pt provided stool specimen for pmd earlier today, aware of need to collect stool and urine if needs to go.

## 2020-01-08 NOTE — Telephone Encounter (Signed)
Spoke with daughter, Magda Paganini. Daughter is not on DPR, therefore patient information not shared.  Daughter with questions regarding turn around time on labs from today and C Diff testing, questions answered. Daughter states patient is becoming more confused. Explained patient may be dehydrated d/t diarrhea. Daughter plans to take patient to Emergency Room for evaluation/treatment.   Patients stool sample also dropped off earlier today, awaiting PCP order for testing.

## 2020-01-08 NOTE — Telephone Encounter (Signed)
I saw this message today AFTER pt already came and went (for labs).

## 2020-01-08 NOTE — Telephone Encounter (Signed)
Noted.  Pt is in the ED being evaluated now (4:50 pm). Signed:  Crissie Sickles, MD           01/08/2020

## 2020-01-08 NOTE — ED Triage Notes (Signed)
Pt arrives with daughter who reports that patient has been having diarrhea since March 18th following a surgery to remove the prostate, bladder, and place urostomy. Pt has given C. Diff sample to PCP today, has not resulted yet. Pt did have urine test done which was WNL per daughter at bedside.

## 2020-01-08 NOTE — Telephone Encounter (Signed)
Patient daughter called back.  She stated that her call was disconnected when she was holding for triage nurse.  She is requesting a call back as soon as possible.  Sending to Roderic Ovens, Clinical Supervisor for assistance

## 2020-01-08 NOTE — Discharge Instructions (Signed)
Your work-up today was concerning for dehydration from the constant diarrhea you are experiencing as well as possible urinary tract infection.  As we discussed, we will hold on antibiotics and let you follow-up with your urologist to interpret the urinalysis and culture.  The C. difficile test is in process, if it is positive, the team will likely call you to discuss antibiotic administration.  Please rest and stay hydrated.  We had a shared decision-making conversation with you and your family today about admission given the tachycardia and low blood pressures however as you were feeling better and appeared to be improving, we agreed to let you go home with plans for strict return precautions.  If any symptoms change or worsen, please return to the nearest emergency department.

## 2020-01-08 NOTE — Telephone Encounter (Signed)
Patient's daughter states patient is having frequent loose bowels that have a funny smell. Patient is coming in for blood work this morning at 10:00. Can he get a C-diff test kit?

## 2020-01-08 NOTE — Telephone Encounter (Signed)
Patient's daughter called back. Patient seems to becoming more confused. Transferred call to triage nurse.

## 2020-01-08 NOTE — Telephone Encounter (Signed)
Pt's daughter would like to know if pt could get stool kit to complete when he comes for blood work today. Please advise, thanks.

## 2020-01-08 NOTE — Telephone Encounter (Signed)
11:18 PM After patient was discharged from the emergency department by me this evening, patient's C. difficile test returned positive.  Given the profound diarrhea that he has been experiencing, we will call in antibiotics.  I spoke with the patient's daughter who will tell the patient and pick up the antibiotics in the morning.  They will follow up with the PCP and understood strict return precautions for further dehydration and management.  They will also call the urologist for further instructions on possible UTI.  They agreed with plan of care and will start antibiotics tomorrow to treat the C. difficile diarrhea.  Prescription for oral vancomycin was called to the [facility] in [place] for the patient to pick up in the morning.

## 2020-01-09 ENCOUNTER — Telehealth: Payer: Self-pay

## 2020-01-09 ENCOUNTER — Encounter: Payer: Self-pay | Admitting: Family Medicine

## 2020-01-09 DIAGNOSIS — A0472 Enterocolitis due to Clostridium difficile, not specified as recurrent: Secondary | ICD-10-CM

## 2020-01-09 HISTORY — DX: Enterocolitis due to Clostridium difficile, not specified as recurrent: A04.72

## 2020-01-09 NOTE — Telephone Encounter (Signed)
Pt's daughter, Brandon Haynes wanted to know if pt would be okay to take align probiotic while on vancomycin or after to help him stay regular.  Please advise, thanks.

## 2020-01-10 MED ORDER — NYSTATIN 100000 UNIT/ML MT SUSP: OROMUCOSAL | 0 refills | Status: DC

## 2020-01-10 NOTE — Telephone Encounter (Signed)
Yes a daily probiotic pill is fine. Also, I eRx'd nystatin for thrush.

## 2020-01-10 NOTE — Addendum Note (Signed)
Addended by: Tammi Sou on: 01/10/2020 12:38 PM   Modules accepted: Orders

## 2020-01-10 NOTE — Telephone Encounter (Signed)
Wife calling (DPR) about probiotic. She is also requesting prescription for thrush.  She says that patient has a white tongue, and very dry mouth.   I explained to her that Dr. Anitra Lauth was not in office today.  She requests a call back from his nurse.

## 2020-01-10 NOTE — Telephone Encounter (Signed)
Pt's wife contacted regarding white tongue and dry mouth. Advised pt would need to be evaluated before abx can be sent. Advised PCP is not in office today but would return tomorrow. Offered virtual with [facility] provider for today, they declined and would prefer to wait until morning

## 2020-01-10 NOTE — Telephone Encounter (Signed)
Pt advised.

## 2020-01-11 LAB — URINE CULTURE: Culture: >=100000 — AB

## 2020-01-24 ENCOUNTER — Telehealth: Payer: Self-pay

## 2020-01-24 NOTE — Telephone Encounter (Signed)
Made in error

## 2020-01-24 NOTE — Telephone Encounter (Signed)
LM for Brandon Haynes calling to request VO. Additional info needed

## 2020-01-25 NOTE — Telephone Encounter (Signed)
Received fax regarding decreasing frequency from 2 to 1 weekly visits. Placed on PCP desk to review and sign, if appropriate.

## 2020-01-25 NOTE — Telephone Encounter (Signed)
Signed and put in box to go up front. Signed:  Crissie Sickles, MD           01/25/2020

## 2020-01-30 ENCOUNTER — Inpatient Hospital Stay: Payer: Medicare HMO

## 2020-01-30 ENCOUNTER — Other Ambulatory Visit: Payer: Self-pay

## 2020-01-30 ENCOUNTER — Inpatient Hospital Stay: Payer: Medicare HMO | Attending: Oncology | Admitting: Oncology

## 2020-01-30 VITALS — BP 138/86 | HR 63 | Temp 98.3°F | Resp 18 | Ht 74.0 in | Wt 208.5 lb

## 2020-01-30 DIAGNOSIS — Z79899 Other long term (current) drug therapy: Secondary | ICD-10-CM | POA: Insufficient documentation

## 2020-01-30 DIAGNOSIS — Z452 Encounter for adjustment and management of vascular access device: Secondary | ICD-10-CM | POA: Insufficient documentation

## 2020-01-30 DIAGNOSIS — D649 Anemia, unspecified: Secondary | ICD-10-CM | POA: Insufficient documentation

## 2020-01-30 DIAGNOSIS — C679 Malignant neoplasm of bladder, unspecified: Secondary | ICD-10-CM | POA: Diagnosis not present

## 2020-01-30 DIAGNOSIS — Z95828 Presence of other vascular implants and grafts: Secondary | ICD-10-CM

## 2020-01-30 DIAGNOSIS — Z7982 Long term (current) use of aspirin: Secondary | ICD-10-CM | POA: Diagnosis not present

## 2020-01-30 LAB — CMP (CANCER CENTER ONLY)
ALT: 9 U/L (ref 0–44)
AST: 17 U/L (ref 15–41)
Albumin: 3.1 g/dL — ABNORMAL LOW (ref 3.5–5.0)
Alkaline Phosphatase: 57 U/L (ref 38–126)
Anion gap: 9 (ref 5–15)
BUN: 11 mg/dL (ref 8–23)
CO2: 26 mmol/L (ref 22–32)
Calcium: 8.5 mg/dL — ABNORMAL LOW (ref 8.9–10.3)
Chloride: 110 mmol/L (ref 98–111)
Creatinine: 1.01 mg/dL (ref 0.61–1.24)
GFR, Est AFR Am: >60 mL/min (ref >60)
GFR, Estimated: >60 mL/min (ref >60)
Glucose, Bld: 90 mg/dL (ref 70–99)
Potassium: 3.8 mmol/L (ref 3.5–5.1)
Sodium: 145 mmol/L (ref 135–145)
Total Bilirubin: 0.4 mg/dL (ref 0.3–1.2)
Total Protein: 6.2 g/dL — ABNORMAL LOW (ref 6.5–8.1)

## 2020-01-30 LAB — CBC WITH DIFFERENTIAL (CANCER CENTER ONLY)
Abs Immature Granulocytes: 0.01 10*3/uL (ref 0.00–0.07)
Basophils Absolute: 0 10*3/uL (ref 0.0–0.1)
Basophils Relative: 0 %
Eosinophils Absolute: 0.3 10*3/uL (ref 0.0–0.5)
Eosinophils Relative: 4 %
HCT: 34.8 % — ABNORMAL LOW (ref 39.0–52.0)
Hemoglobin: 10.9 g/dL — ABNORMAL LOW (ref 13.0–17.0)
Immature Granulocytes: 0 %
Lymphocytes Relative: 33 %
Lymphs Abs: 1.9 10*3/uL (ref 0.7–4.0)
MCH: 29.3 pg (ref 26.0–34.0)
MCHC: 31.3 g/dL (ref 30.0–36.0)
MCV: 93.5 fL (ref 80.0–100.0)
Monocytes Absolute: 0.4 10*3/uL (ref 0.1–1.0)
Monocytes Relative: 7 %
Neutro Abs: 3.3 10*3/uL (ref 1.7–7.7)
Neutrophils Relative %: 56 %
Platelet Count: 211 10*3/uL (ref 150–400)
RBC: 3.72 MIL/uL — ABNORMAL LOW (ref 4.22–5.81)
RDW: 14.8 % (ref 11.5–15.5)
WBC Count: 5.9 10*3/uL (ref 4.0–10.5)
nRBC: 0 % (ref 0.0–0.2)

## 2020-01-30 MED ORDER — SODIUM CHLORIDE 0.9% FLUSH
10.0000 mL | INTRAVENOUS | Status: DC | PRN
  Administered 2020-01-30: 10 mL
  Filled 2020-01-30: qty 10

## 2020-01-30 NOTE — Progress Notes (Signed)
Hematology and Oncology Follow Up Visit  Brandon Haynes [id number] 12/28/1940 79 y.o. 01/30/2020 9:23 AM McGowen, [name], MDMcGowen, [name], MD   Principle Diagnosis: 79 year old man with bladder cancer diagnosed in August 2020.  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   Prior Therapy:  He is status post TURBT on 06/12/2019 which showed high grade invasive urothelial carcinoma invading into the muscularis propria.  Neoadjuvant chemotherapy utilizing cisplatin and gemcitabine.  He completed 4 cycles of chemotherapy on October 20, 2019.  He is status post radical cystectomy and lymphadenectomy completed on December 13, 2019.  Final pathology showed The carcinoma in situ with 0 out of 12 lymph node involvement.    Current therapy: Observation and surveillance post cystoprostatectomy.    Interim History: Brandon Haynes returns today for a follow-up visit.  Since the last visit, he underwent cystoprostatectomy and lymphadenectomy on December 13, 2019 without any complications.  Tolerated the procedure well discharged without any issues.  He has recovered reasonably well and has regained most activities of daily living.  He is able to drive without any difficulties.  Denies any abdominal pain, flank pain or hematuria.       Medications: Reviewed without changes. Current Outpatient Medications  Medication Sig Dispense Refill  . allopurinol (ZYLOPRIM) 300 MG tablet TAKE 1 TABLET BY MOUTH EVERY DAY (Patient taking differently: Take 300 mg by mouth at bedtime. ) 90 tablet 0  . amLODipine (NORVASC) 5 MG tablet TAKE 1 TABLET BY MOUTH EVERY DAY (Patient not taking: No sig reported) 90 tablet 1  . aspirin EC 81 MG tablet Take 81 mg by mouth daily.    . cephALEXin (KEFLEX) 500 MG capsule Take 500 mg by mouth 2 (two) times daily.    Marland Kitchen desonide (DESOWEN) 0.05 % cream Apply 1 application topically 3  (three) times a week.   1  . diphenhydrAMINE (BENADRYL) 25 MG tablet Take 50 mg by mouth 3 (three) times daily as needed for itching or allergies.     Marland Kitchen EPINEPHrine 0.3 mg/0.3 mL IJ SOAJ injection Inject 0.3 mg into the muscle as needed for anaphylaxis.     . fluticasone (FLONASE) 50 MCG/ACT nasal spray Place 1 spray into both nostrils daily.    Marland Kitchen gabapentin (NEURONTIN) 300 MG capsule Take 300-600 mg by mouth See admin instructions. Take 2 capsules (600 mg) by mouth in the morning & take 1 capsule (300 mg) by mouth in the afternoon.    . loratadine (CLARITIN) 10 MG tablet Take 20 mg by mouth daily.     Marland Kitchen losartan (COZAAR) 50 MG tablet Take 1 tablet (50 mg total) by mouth 2 (two) times daily. Please make yearly appt with Dr. Radford Pax for June for future refills. 1st attempt 180 tablet 0  . metoprolol tartrate (LOPRESSOR) 25 MG tablet Take 1 tablet (25 mg total) by mouth 2 (two) times daily. (Patient not taking: Reported on 01/03/2020) 180 tablet 3  . nitrofurantoin, macrocrystal-monohydrate, (MACROBID) 100 MG capsule Take 100 mg by mouth 2 (two) times daily.    . nitroGLYCERIN (NITROSTAT) 0.4 MG SL tablet PLACE 1 TABLET UNDER TONGUE EVERY 5 MINS, UP TO 3 DOSES AS NEEDED FOR CHEST PAIN (Patient not taking: Reported on 01/03/2020) 25 tablet 1  . nystatin (MYCOSTATIN) 100000 UNIT/ML suspension Swish and swallow 5 ml qid x 10 d 100 mL 0  . oxybutynin (DITROPAN) 5 MG tablet Take 5  mg by mouth 3 (three) times daily.    . pantoprazole (PROTONIX) 40 MG tablet Take 1 tablet (40 mg total) by mouth daily. 90 tablet 3  . PREVIDENT 5000 BOOSTER PLUS 1.1 % PSTE Take 1 application by mouth at bedtime.   1  . rosuvastatin (CRESTOR) 20 MG tablet TAKE 1 TABLET BY MOUTH EVERY DAY (Patient taking differently: Take 20 mg by mouth every evening. ) 90 tablet 1  . traMADol (ULTRAM) 50 MG tablet Take 50 mg by mouth 3 (three) times daily as needed.     No current facility-administered medications for this visit.     Allergies:  No Known Allergies      Physical Exam:  Blood pressure 138/86, pulse 63, temperature 98.3 F (36.8 C), temperature source Temporal, resp. rate 18, height 6\' 2"  (1.88 m), weight 208 lb 8 oz (94.6 kg), SpO2 99 %.    ECOG: 0    General appearance: Comfortable appearing without any discomfort Head: Normocephalic without any trauma Oropharynx: Mucous membranes are moist and pink without any thrush or ulcers. Eyes: Pupils are equal and round reactive to light. Lymph nodes: No cervical, supraclavicular, inguinal or axillary lymphadenopathy.   Heart:regular rate and rhythm.  S1 and S2 without leg edema. Lung: Clear without any rhonchi or wheezes.  No dullness to percussion. Abdomin: Soft, nontender, nondistended with good bowel sounds.  No hepatosplenomegaly. Musculoskeletal: No joint deformity or effusion.  Full range of motion noted. Neurological: No deficits noted on motor, sensory and deep tendon reflex exam. Skin: No petechial rash or dryness.  Appeared moist.         Lab Results: Lab Results  Component Value Date   WBC 5.9 01/30/2020   HGB 10.9 (L) 01/30/2020   HCT 34.8 (L) 01/30/2020   MCV 93.5 01/30/2020   PLT 211 01/30/2020     Chemistry      Component Value Date/Time   NA 133 (L) 01/08/2020 1617   NA 143 03/13/2019 1141   K 3.0 (L) 01/08/2020 1617   CL 100 01/08/2020 1617   CO2 23 01/08/2020 1617   BUN 16 01/08/2020 1617   BUN 11 03/13/2019 1141   CREATININE 0.95 01/08/2020 1617   CREATININE 0.94 01/08/2020 0954      Component Value Date/Time   CALCIUM 8.0 (L) 01/08/2020 1617   ALKPHOS 48 01/08/2020 1617   AST 12 (L) 01/08/2020 1617   AST 14 (L) 10/25/2019 0924   ALT 9 01/08/2020 1617   ALT 7 10/25/2019 0924   BILITOT 0.6 01/08/2020 1617   BILITOT 0.5 10/25/2019 0924      Impression and Plan:  79 year old man with:  1.  High-grade urothelial carcinoma of the bladder diagnosed with T2N0 in August 2020.  He had residual carcinoma in situ after  neoadjuvant chemotherapy and radical cystectomy..   The natural course of this disease was reviewed at this time and risk of relapse was discussed.  His pathology results are radical cystectomy was personally reviewed and discussed with the patient.  He had an excellent response to chemotherapy and curative surgery without any complications.  I recommended continued active surveillance at this time without any additional treatments for the time being.  Any systemic therapy additionally will be reserved if you develop recurrent disease.  He will really have repeat imaging studies in 4 to 6 months under care of Dr. Tresa Moore return follow-up after that.  2.  IV access: Port-A-Cath will be flushed periodically.  Risks and benefits of continuing this  or removing his Port-A-Cath was reviewed today.  The plan is to keep it for the time being and plan for Port-A-Cath removal after his next scan.  3.  Anemia: Related to his recent surgery and chemotherapy.  He was to be improving at this time.  4.  Neutropenia: Related to chemotherapy and has recovered fully at this time.  5.  Renal function surveillance: Kidney function remains normal after chemotherapy and surgery.  6.  Follow-up: In 6 months for a follow-up visit.  30 minutes were spent on this visit.  The time was dedicated to reviewing his pathology reports, discussing treatment options and future plan of care discussion.     Zola Button, MD 5/4/20219:23 AM

## 2020-01-31 ENCOUNTER — Telehealth: Payer: Self-pay | Admitting: Oncology

## 2020-01-31 NOTE — Telephone Encounter (Signed)
Scheduled appt per 5/4 los.  Left a vm of the next scheduled appt date and time

## 2020-02-06 ENCOUNTER — Telehealth: Payer: Self-pay

## 2020-02-06 NOTE — Telephone Encounter (Signed)
Received fax from [place] regarding pt receiving DME supplies. Placed on PCP desk to review and sign, if appropriate.

## 2020-02-13 ENCOUNTER — Telehealth: Payer: Self-pay

## 2020-02-13 NOTE — Telephone Encounter (Signed)
Received call from [facility] with [facility] requesting verbal for one additional appt this week and to be discharged next week. Patient originally scheduled to be discharged this week but missed one appt last week. She was made aware PCP out of office until Monday.  Okay for verbal?

## 2020-02-14 ENCOUNTER — Telehealth: Payer: Self-pay

## 2020-02-14 ENCOUNTER — Other Ambulatory Visit: Payer: Self-pay | Admitting: Family Medicine

## 2020-02-14 NOTE — Telephone Encounter (Signed)
Attempted to contact Leda Gauze regarding orders, number not working.  LM with Santiago Glad @ [phone] to return call or have Leda Gauze return call.

## 2020-02-14 NOTE — Telephone Encounter (Signed)
Received med supply order needing PCP signature to sign off on, placed on PCP desk for review.

## 2020-02-15 ENCOUNTER — Telehealth: Payer: Self-pay

## 2020-02-15 NOTE — Telephone Encounter (Signed)
Patient called and wanted to come by office to pick up stool test kit. He feels as if C Diff is coming back.  I told patient he would need an order from Dr. Anitra Lauth and he is out of office, I would have his assistant call him.  Please call patient at [phone].  Thank you.

## 2020-02-15 NOTE — Telephone Encounter (Signed)
Patient contacted to get current symptoms, he has been having loose stools for 4 days occurring 10-12 times a day. Denies fever or blood in stool. Attempt made to set up virtual appt with in office provider but his wife normally assists and she is not currently at home with him. Advised we could try to schedule for tomorrow instead but declined for now stating they had plans to go out of town early morning. He will call back if anything changes, otherwise the next best option would be to go to ED for further evaluation/tests. Voiced understanding

## 2020-02-16 ENCOUNTER — Telehealth: Payer: Medicare HMO | Admitting: Family Medicine

## 2020-02-19 ENCOUNTER — Other Ambulatory Visit: Payer: Self-pay

## 2020-02-19 ENCOUNTER — Encounter (HOSPITAL_BASED_OUTPATIENT_CLINIC_OR_DEPARTMENT_OTHER): Payer: Self-pay | Admitting: *Deleted

## 2020-02-19 ENCOUNTER — Ambulatory Visit: Payer: Medicare HMO | Admitting: Family Medicine

## 2020-02-19 ENCOUNTER — Emergency Department (HOSPITAL_BASED_OUTPATIENT_CLINIC_OR_DEPARTMENT_OTHER): Payer: Medicare HMO

## 2020-02-19 ENCOUNTER — Telehealth: Payer: Self-pay

## 2020-02-19 ENCOUNTER — Inpatient Hospital Stay (HOSPITAL_BASED_OUTPATIENT_CLINIC_OR_DEPARTMENT_OTHER)
Admission: EM | Admit: 2020-02-19 | Discharge: 2020-02-26 | DRG: 872 | Disposition: A | Discharge status: Home / Self Care | Payer: Medicare HMO | Attending: Internal Medicine | Admitting: Internal Medicine

## 2020-02-19 ENCOUNTER — Encounter: Payer: Self-pay | Admitting: Family Medicine

## 2020-02-19 VITALS — BP 86/57 | HR 124 | Temp 98.2°F | Resp 16 | Ht 74.0 in | Wt 195.6 lb

## 2020-02-19 DIAGNOSIS — M5136 Other intervertebral disc degeneration, lumbar region: Secondary | ICD-10-CM | POA: Diagnosis present

## 2020-02-19 DIAGNOSIS — I739 Peripheral vascular disease, unspecified: Secondary | ICD-10-CM | POA: Diagnosis present

## 2020-02-19 DIAGNOSIS — Z7982 Long term (current) use of aspirin: Secondary | ICD-10-CM

## 2020-02-19 DIAGNOSIS — Z87891 Personal history of nicotine dependence: Secondary | ICD-10-CM

## 2020-02-19 DIAGNOSIS — A419 Sepsis, unspecified organism: Secondary | ICD-10-CM | POA: Diagnosis present

## 2020-02-19 DIAGNOSIS — Z79899 Other long term (current) drug therapy: Secondary | ICD-10-CM

## 2020-02-19 DIAGNOSIS — A0472 Enterocolitis due to Clostridium difficile, not specified as recurrent: Secondary | ICD-10-CM | POA: Diagnosis present

## 2020-02-19 DIAGNOSIS — Z936 Other artificial openings of urinary tract status: Secondary | ICD-10-CM

## 2020-02-19 DIAGNOSIS — G8929 Other chronic pain: Secondary | ICD-10-CM | POA: Diagnosis present

## 2020-02-19 DIAGNOSIS — N179 Acute kidney failure, unspecified: Secondary | ICD-10-CM | POA: Diagnosis present

## 2020-02-19 DIAGNOSIS — E861 Hypovolemia: Secondary | ICD-10-CM

## 2020-02-19 DIAGNOSIS — A09 Infectious gastroenteritis and colitis, unspecified: Secondary | ICD-10-CM | POA: Diagnosis not present

## 2020-02-19 DIAGNOSIS — H9190 Unspecified hearing loss, unspecified ear: Secondary | ICD-10-CM | POA: Diagnosis present

## 2020-02-19 DIAGNOSIS — M109 Gout, unspecified: Secondary | ICD-10-CM | POA: Diagnosis present

## 2020-02-19 DIAGNOSIS — K529 Noninfective gastroenteritis and colitis, unspecified: Secondary | ICD-10-CM

## 2020-02-19 DIAGNOSIS — I6523 Occlusion and stenosis of bilateral carotid arteries: Secondary | ICD-10-CM | POA: Diagnosis present

## 2020-02-19 DIAGNOSIS — Z803 Family history of malignant neoplasm of breast: Secondary | ICD-10-CM

## 2020-02-19 DIAGNOSIS — Z85828 Personal history of other malignant neoplasm of skin: Secondary | ICD-10-CM

## 2020-02-19 DIAGNOSIS — I9589 Other hypotension: Secondary | ICD-10-CM

## 2020-02-19 DIAGNOSIS — H269 Unspecified cataract: Secondary | ICD-10-CM | POA: Diagnosis present

## 2020-02-19 DIAGNOSIS — Z955 Presence of coronary angioplasty implant and graft: Secondary | ICD-10-CM

## 2020-02-19 DIAGNOSIS — E7800 Pure hypercholesterolemia, unspecified: Secondary | ICD-10-CM | POA: Diagnosis present

## 2020-02-19 DIAGNOSIS — Z801 Family history of malignant neoplasm of trachea, bronchus and lung: Secondary | ICD-10-CM

## 2020-02-19 DIAGNOSIS — Z8619 Personal history of other infectious and parasitic diseases: Secondary | ICD-10-CM

## 2020-02-19 DIAGNOSIS — A414 Sepsis due to anaerobes: Principal | ICD-10-CM | POA: Diagnosis present

## 2020-02-19 DIAGNOSIS — M159 Polyosteoarthritis, unspecified: Secondary | ICD-10-CM | POA: Diagnosis present

## 2020-02-19 DIAGNOSIS — Z9221 Personal history of antineoplastic chemotherapy: Secondary | ICD-10-CM | POA: Diagnosis not present

## 2020-02-19 DIAGNOSIS — Z833 Family history of diabetes mellitus: Secondary | ICD-10-CM

## 2020-02-19 DIAGNOSIS — I10 Essential (primary) hypertension: Secondary | ICD-10-CM | POA: Diagnosis present

## 2020-02-19 DIAGNOSIS — Z8551 Personal history of malignant neoplasm of bladder: Secondary | ICD-10-CM

## 2020-02-19 DIAGNOSIS — Z825 Family history of asthma and other chronic lower respiratory diseases: Secondary | ICD-10-CM

## 2020-02-19 DIAGNOSIS — F419 Anxiety disorder, unspecified: Secondary | ICD-10-CM | POA: Diagnosis present

## 2020-02-19 DIAGNOSIS — K219 Gastro-esophageal reflux disease without esophagitis: Secondary | ICD-10-CM | POA: Diagnosis present

## 2020-02-19 DIAGNOSIS — I2510 Atherosclerotic heart disease of native coronary artery without angina pectoris: Secondary | ICD-10-CM | POA: Diagnosis not present

## 2020-02-19 DIAGNOSIS — Z20822 Contact with and (suspected) exposure to covid-19: Secondary | ICD-10-CM | POA: Diagnosis present

## 2020-02-19 DIAGNOSIS — E876 Hypokalemia: Secondary | ICD-10-CM | POA: Diagnosis present

## 2020-02-19 DIAGNOSIS — Z8249 Family history of ischemic heart disease and other diseases of the circulatory system: Secondary | ICD-10-CM

## 2020-02-19 DIAGNOSIS — Z8052 Family history of malignant neoplasm of bladder: Secondary | ICD-10-CM

## 2020-02-19 HISTORY — DX: Noninfective gastroenteritis and colitis, unspecified: K52.9

## 2020-02-19 LAB — CBC WITH DIFFERENTIAL/PLATELET
Abs Immature Granulocytes: 0.22 10*3/uL — ABNORMAL HIGH (ref 0.00–0.07)
Basophils Absolute: 0.1 10*3/uL (ref 0.0–0.1)
Basophils Relative: 0 %
Eosinophils Absolute: 0 10*3/uL (ref 0.0–0.5)
Eosinophils Relative: 0 %
HCT: 42.4 % (ref 39.0–52.0)
Hemoglobin: 13.8 g/dL (ref 13.0–17.0)
Immature Granulocytes: 1 %
Lymphocytes Relative: 11 %
Lymphs Abs: 3.3 10*3/uL (ref 0.7–4.0)
MCH: 29.1 pg (ref 26.0–34.0)
MCHC: 32.5 g/dL (ref 30.0–36.0)
MCV: 89.3 fL (ref 80.0–100.0)
Monocytes Absolute: 2.1 10*3/uL — ABNORMAL HIGH (ref 0.1–1.0)
Monocytes Relative: 7 %
Neutro Abs: 23.9 10*3/uL — ABNORMAL HIGH (ref 1.7–7.7)
Neutrophils Relative %: 81 %
Platelets: 253 10*3/uL (ref 150–400)
RBC: 4.75 MIL/uL (ref 4.22–5.81)
RDW: 15.9 % — ABNORMAL HIGH (ref 11.5–15.5)
WBC: 29.6 10*3/uL — ABNORMAL HIGH (ref 4.0–10.5)
nRBC: 0 % (ref 0.0–0.2)

## 2020-02-19 LAB — URINALYSIS, ROUTINE W REFLEX MICROSCOPIC
Bilirubin Urine: NEGATIVE
Glucose, UA: NEGATIVE mg/dL
Ketones, ur: NEGATIVE mg/dL
Nitrite: NEGATIVE
Protein, ur: 30 mg/dL — AB
Specific Gravity, Urine: 1.015 (ref 1.005–1.030)
pH: 6.5 (ref 5.0–8.0)

## 2020-02-19 LAB — COMPREHENSIVE METABOLIC PANEL
ALT: 11 U/L (ref 0–44)
AST: 12 U/L — ABNORMAL LOW (ref 15–41)
Albumin: 2.7 g/dL — ABNORMAL LOW (ref 3.5–5.0)
Alkaline Phosphatase: 52 U/L (ref 38–126)
Anion gap: 12 (ref 5–15)
BUN: 22 mg/dL (ref 8–23)
CO2: 19 mmol/L — ABNORMAL LOW (ref 22–32)
Calcium: 8.3 mg/dL — ABNORMAL LOW (ref 8.9–10.3)
Chloride: 101 mmol/L (ref 98–111)
Creatinine, Ser: 1.52 mg/dL — ABNORMAL HIGH (ref 0.61–1.24)
GFR calc Af Amer: 50 mL/min — ABNORMAL LOW (ref >60)
GFR calc non Af Amer: 44 mL/min — ABNORMAL LOW (ref >60)
Glucose, Bld: 180 mg/dL — ABNORMAL HIGH (ref 70–99)
Potassium: 4.1 mmol/L (ref 3.5–5.1)
Sodium: 132 mmol/L — ABNORMAL LOW (ref 135–145)
Total Bilirubin: 0.8 mg/dL (ref 0.3–1.2)
Total Protein: 5.6 g/dL — ABNORMAL LOW (ref 6.5–8.1)

## 2020-02-19 LAB — LACTIC ACID, PLASMA: Lactic Acid, Venous: 1.8 mmol/L (ref 0.5–1.9)

## 2020-02-19 LAB — URINALYSIS, MICROSCOPIC (REFLEX)

## 2020-02-19 LAB — PROTIME-INR
INR: 1.1 (ref 0.8–1.2)
Prothrombin Time: 13.8 seconds (ref 11.4–15.2)

## 2020-02-19 LAB — SARS CORONAVIRUS 2 BY RT PCR (HOSPITAL ORDER, PERFORMED IN ~~LOC~~ HOSPITAL LAB): SARS Coronavirus 2: NEGATIVE

## 2020-02-19 LAB — TROPONIN I (HIGH SENSITIVITY)
Troponin I (High Sensitivity): 14 ng/L (ref <18)
Troponin I (High Sensitivity): 14 ng/L (ref <18)

## 2020-02-19 LAB — CBG MONITORING, ED: Glucose-Capillary: 183 mg/dL — ABNORMAL HIGH (ref 70–99)

## 2020-02-19 LAB — APTT: aPTT: 28 seconds (ref 24–36)

## 2020-02-19 MED ORDER — LACTATED RINGERS IV BOLUS (SEPSIS)
1000.0000 mL | Freq: Once | INTRAVENOUS | Status: AC
  Administered 2020-02-19: 1000 mL via INTRAVENOUS

## 2020-02-19 MED ORDER — LACTATED RINGERS IV SOLN: INTRAVENOUS | Status: DC

## 2020-02-19 MED ORDER — IOHEXOL 350 MG/ML SOLN
100.0000 mL | Freq: Once | INTRAVENOUS | Status: AC
  Administered 2020-02-19: 100 mL via INTRAVENOUS

## 2020-02-19 MED ORDER — METRONIDAZOLE IN NACL 5-0.79 MG/ML-% IV SOLN
500.0000 mg | Freq: Once | INTRAVENOUS | Status: AC
  Administered 2020-02-19: 500 mg via INTRAVENOUS
  Filled 2020-02-19: qty 100

## 2020-02-19 MED ORDER — VANCOMYCIN 50 MG/ML ORAL SOLUTION
125.0000 mg | Freq: Every day | ORAL | Status: DC
  Filled 2020-02-19: qty 2.5

## 2020-02-19 MED ORDER — VANCOMYCIN 50 MG/ML ORAL SOLUTION
125.0000 mg | Freq: Two times a day (BID) | ORAL | Status: DC
  Filled 2020-02-19: qty 2.5

## 2020-02-19 MED ORDER — ACETAMINOPHEN 500 MG PO TABS
1000.0000 mg | ORAL_TABLET | Freq: Once | ORAL | Status: AC
  Administered 2020-02-19: 1000 mg via ORAL
  Filled 2020-02-19: qty 2

## 2020-02-19 MED ORDER — VANCOMYCIN 50 MG/ML ORAL SOLUTION
125.0000 mg | ORAL | Status: DC
  Filled 2020-02-19: qty 2.5

## 2020-02-19 MED ORDER — VANCOMYCIN 50 MG/ML ORAL SOLUTION
125.0000 mg | Freq: Four times a day (QID) | ORAL | Status: DC
  Administered 2020-02-19 – 2020-02-26 (×26): 125 mg via ORAL
  Filled 2020-02-19 (×37): qty 2.5

## 2020-02-19 MED ORDER — TRAMADOL HCL 50 MG PO TABS
50.0000 mg | ORAL_TABLET | Freq: Once | ORAL | Status: AC
  Administered 2020-02-19: 50 mg via ORAL
  Filled 2020-02-19: qty 1

## 2020-02-19 NOTE — Patient Instructions (Signed)
Sent to ED r/o sepsis.

## 2020-02-19 NOTE — Telephone Encounter (Signed)
Patient can be scheduled with other provider as in office. Openings available for afternoon

## 2020-02-19 NOTE — Telephone Encounter (Signed)
Patient has been scheduled

## 2020-02-19 NOTE — ED Notes (Signed)
Vancomycin PO will be delayed , unavailable in pyxis , requested carrier from [place] .

## 2020-02-19 NOTE — Telephone Encounter (Signed)
Patient called in stating that he is very weak and going to bathroom at least 15 times a day. He states he is very weak and is not eating much but is drinking a lot. He seems to think its cdiff again and wants the Dr to let him come in to give a stool sample and to be seen. I let both patient an wife know the schedule was full but doctors nurse would get back to them to better assist    Please call and advise

## 2020-02-19 NOTE — Progress Notes (Signed)
This visit occurred during the SARS-CoV-2 public health emergency.  Safety protocols were in place, including screening questions prior to the visit, additional usage of staff PPE, and extensive cleaning of exam room while observing appropriate contact time as indicated for disinfecting solutions.    Brandon Haynes , 07/06/1941, 79 y.o., male MRN: [id number] Patient Care Team    Relationship Specialty Notifications Start End  McGowen, [name], MD PCP - General Family Medicine  02/26/14    Comment: Dr. Lenna Gilford retirement (Merged)  Jovita Gamma, MD Consulting Physician Neurosurgery  02/26/14   Mcarthur Rossetti, MD Consulting Physician Orthopedic Surgery  02/26/14   Lavonna Monarch, MD Consulting Physician Dermatology  02/26/14   Sueanne Margarita, MD Consulting Physician Cardiology  10/18/15   Gardiner Barefoot, [place] Consulting Physician Podiatry  07/11/16   Armbruster, Carlota Raspberry, MD Consulting Physician Gastroenterology  03/18/17   Melida Quitter, MD Consulting Physician Otolaryngology  10/22/17   Harold Hedge, Darrick Grinder, MD Consulting Physician Allergy and Immunology  01/09/19   Lucas Mallow, MD Consulting Physician Urology  06/14/19   Wyatt Portela, MD Consulting Physician Oncology  07/02/19   [name], MD Consulting Physician Urology  01/01/20    Comment: he did pt's cystoprostatectomy    Chief Complaint  Patient presents with  . Possible C-diff    he is having loose stools 12-15 times a day, trying to stay hydrated but not eating.     Subjective: Pt presents for an OV with his wife with  complaints of frequent stools 12-15 times since 02/11/2020. He was treated for C. Diff last month oral vanc x10 days. He reports he did have relief in his symptoms for 2-3 weeks. He has not had a fever or chills. He has had no appetite. His wife states he will not eat anything. He has been drinking chocolate milk and ensure, along with some water. He denies vomiting.  He original symptoms started  after Cystect ileal conduit 12/13/2019 secondary to his h/o bladder cancer.   Depression screen [facility] 2/9 01/03/2020 10/14/2018 10/22/2017 10/20/2016 10/18/2015  Decreased Interest 0 0 0 0 0  Down, Depressed, Hopeless 0 0 0 0 0  PHQ - 2 Score 0 0 0 0 0  Some recent data might be hidden    No Known Allergies Social History   Social History Narrative   Married, 3 children.   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   Orig from [place], [place] same [place] all his [place].   Former smoker: quit approx around [place].  80 pack-yr hx.   Alcohol-none, used to drink a lot of beer on weekends.  No drugs.   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   Diet: fair   Past Medical History:  Diagnosis Date  . Allergic rhinitis    allergy shots via   . Anxiety   . Bladder cancer ([place]) 05/2019   INVASIVE HIGH GRADE PAPILLARY UROTHELIAL CARCINOMA, invading muscularis propia.  Lesion resected. No mets.  He got neoadjuvant chemo x 4 cycles.  Cystoprostectomy with ileal conduit formation 12/13/19 (Dr. Tresa Moore).  . 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 C. difficile colitis 01/09/2020   ED dx->oral vancomycin  . CAD, multiple vessel    a. CP/near-syncope 11/2014 s/p DES to MLAD, occluded RCA after takeoff of RV marginal with collaterals treated medically, otherwise mild nonobstructive disease. EF 55%;  b. 07/2015 Lexiscan CL: EF 56%, no ischemia/infarct.   Marland Kitchen  Cataracts, both eyes   . Chronic low back pain   . DDD (degenerative disc disease), lumbar   . GERD (gastroesophageal reflux disease)    PPI bid needed to control sx's  . Gout   . Hearing loss   . History  of basal cell carcinoma   . History of adenomatous polyp of colon    Rpt colonoscopy recommended 02/2020->no further if no high risk lesions at that time.  . Hypercholesteremia   . Hypertension    Hx labile/uncontrolled.  Improved s/p visit to HTN clinic 2019.  . Iron deficiency anemia 2017   Colonoscopy, EGD, and capsule  endoscopy w/out obvious cause/source found.  Oral iron helping as of 03/2016.  . Leg pain 03/06/2016  . Nonmelanoma skin cancer   . 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 Peripheral vascular disease ([place])   . Pleural thickening    chronic  . 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 No avid enhancement or mass effect. Obs by urol as of 10/2019.   Past Surgical History:  Procedure Laterality Date  . back injection  oct 2011, 07/2010, 12/2012   no surgery  . Capsule endoscopy  02/2016   mild duodenitis, o/w NEG  . CARDIOVASCULAR STRESS TEST  05/2009; 07/2015  . carotid dopplers  11/2014   Bilateral - 1% to 9% ICA stenosis. Vertebral artery flow is  . COLONOSCOPY  08/2008; 02/2016; 02/2017   2017: 12 polyps (adenomatous).  02/2017 repeat TCS--multiple polyps: recall 3 yrs.  . CORONARY ANGIOPLASTY WITH STENT PLACEMENT  12/04/2014   4.0 x 12 mm Synergy stent to the mid LAD  . CYSTOSCOPY  05/24/2019  . CYSTOSCOPY W/ TRANSURETHRAL RESECTION OF POSTERIOR URETHERAL VALVES  06/12/2019   2-5cm  . CYSTOSCOPY WITH INJECTION N/A 12/13/2019   Procedure: CYSTOSCOPY WITH INJECTION;  Surgeon: [name], MD;  Location: [facility];  Service: Urology;  Laterality: N/A;  . ESOPHAGOGASTRODUODENOSCOPY  02/2016   Gastritis.  NEG h pylori.    . IR IMAGING GUIDED PORT INSERTION  09/12/2019  . LE arterial vascular study  02/2016   Per Dr. Radford Pax, no evidence of peripheral vascular disease  . LEFT HEART CATHETERIZATION WITH CORONARY ANGIOGRAM N/A 12/04/2014   Procedure: LEFT HEART CATHETERIZATION WITH CORONARY ANGIOGRAM;  Surgeon: Jolaine Artist, MD; LAD 99% then aneurysmal, OM1 40%, CFX 50%, RCA 40%, 50% then occluded  . LYMPHADENECTOMY Bilateral 12/13/2019   Procedure: LYMPHADENECTOMY;  Surgeon: [name], MD;  Location: [facility];  Service: Urology;  Laterality: Bilateral;  . ROBOT ASSISTED LAPAROSCOPIC COMPLETE CYSTECT ILEAL CONDUIT N/A 12/13/2019    Procedure: XI ROBOTIC ASSISTED LAPAROSCOPIC COMPLETE CYSTECT ILEAL CONDUIT AND RADICAL PROSTATECTOMY;  Surgeon: [name], MD;  Location: [facility];  Service: Urology;  Laterality: N/A;  6 HRS  . ROTATOR CUFF REPAIR Left 09/2013   left  . SHOULDER INJECTION  feb 2014  . TRANSTHORACIC ECHOCARDIOGRAM  11/2014   EF 50-55%, some wall motion abnormalities noted, grade I diast dysfxn  . TRANSURETHRAL RESECTION OF BLADDER TUMOR N/A 06/12/2019   INVASIVE HIGH GRADE PAPILLARY UROTHELIAL CARCINOMA, invading muscularis propria.  Procedure: TRANSURETHRAL RESECTION OF BLADDER TUMOR (TURBT);  Surgeon: Lucas Mallow, MD;  Location: [facility];  Service: Urology;  Laterality: N/A;   Family History  Problem Relation Age of Onset  . Lung cancer Father   . Heart disease Father   . Breast cancer Mother   . Congestive Heart Failure Mother   .  Diabetes Mother   . Breast cancer Sister   . Heart disease Brother   . COPD Brother   . Bladder Cancer Maternal Grandfather   . Colon cancer Neg Hx   . Esophageal cancer Neg Hx   . Pancreatic cancer Neg Hx   . Rectal cancer Neg Hx   . Stomach cancer Neg Hx   . Prostate cancer Neg Hx    Allergies as of 02/19/2020   No Known Allergies     Medication List       Accurate as of Feb 19, 2020  2:39 PM. If you have any questions, ask your nurse or doctor.        STOP taking these medications   cephALEXin 500 MG capsule Commonly known as: KEFLEX Stopped by: Howard Pouch, DO   nitrofurantoin (macrocrystal-monohydrate) 100 MG capsule Commonly known as: MACROBID Stopped by: Howard Pouch, DO   nystatin 100000 UNIT/ML suspension Commonly known as: MYCOSTATIN Stopped by: Howard Pouch, DO     TAKE these medications   allopurinol 300 MG tablet Commonly known as: ZYLOPRIM TAKE 1 TABLET BY MOUTH EVERY DAY   amLODipine 5 MG tablet Commonly known as: NORVASC TAKE 1 TABLET BY MOUTH EVERY DAY   aspirin EC 81 MG tablet Take 81 mg by mouth daily.   desonide  0.05 % cream Commonly known as: DESOWEN Apply 1 application topically 3 (three) times a week.   diphenhydrAMINE 25 MG tablet Commonly known as: BENADRYL Take 50 mg by mouth 3 (three) times daily as needed for itching or allergies.   EPINEPHrine 0.3 mg/0.3 mL Soaj injection Commonly known as: EPI-PEN Inject 0.3 mg into the muscle as needed for anaphylaxis.   fluticasone 50 MCG/ACT nasal spray Commonly known as: FLONASE Place 1 spray into both nostrils daily.   gabapentin 300 MG capsule Commonly known as: NEURONTIN Take 300-600 mg by mouth See admin instructions. Take 2 capsules (600 mg) by mouth in the morning & take 1 capsule (300 mg) by mouth in the afternoon.   loratadine 10 MG tablet Commonly known as: CLARITIN Take 20 mg by mouth daily.   losartan 50 MG tablet Commonly known as: COZAAR Take 1 tablet (50 mg total) by mouth 2 (two) times daily. Please make yearly appt with Dr. Radford Pax for June for future refills. 1st attempt   metoprolol tartrate 25 MG tablet Commonly known as: LOPRESSOR Take 1 tablet (25 mg total) by mouth 2 (two) times daily.   nitroGLYCERIN 0.4 MG SL tablet Commonly known as: NITROSTAT PLACE 1 TABLET UNDER TONGUE EVERY 5 MINS, UP TO 3 DOSES AS NEEDED FOR CHEST PAIN   oxybutynin 5 MG tablet Commonly known as: DITROPAN Take 5 mg by mouth 3 (three) times daily.   pantoprazole 40 MG tablet Commonly known as: PROTONIX Take 1 tablet (40 mg total) by mouth daily.   PreviDent 5000 Booster Plus 1.1 % Pste Generic drug: Sodium Fluoride Take 1 application by mouth at bedtime.   rosuvastatin 20 MG tablet Commonly known as: CRESTOR TAKE 1 TABLET BY MOUTH EVERY DAY What changed: when to take this   traMADol 50 MG tablet Commonly known as: ULTRAM Take 50 mg by mouth 3 (three) times daily as needed.       All past medical history, surgical history, allergies, family history, immunizations andmedications were updated in the EMR today and reviewed under  the history and medication portions of their EMR.     ROS: Negative, with the exception of above mentioned in HPI  Objective:  BP (!) 86/57 (BP Location: Right Arm, Patient Position: Sitting, Cuff Size: Normal)   Pulse (!) 124   Temp 98.2 F (36.8 C) (Temporal)   Resp 16   Ht 6\' 2"  (1.88 m)   Wt 195 lb 9.6 oz (88.7 kg)   SpO2 96%   BMI 25.11 kg/m  Body mass index is 25.11 kg/m. Gen: Afebrile. Appears severely ill and weak.   HENT: AT. [place]. Dry mucous membranes.  CV: RRR, no edema.  Chest: CTAB, no wheeze or crackles.  Abd: Soft. Surgical bag present over abd. BS hyperactive.  Neuro:  Very weak. Needed wheelchair.  PERLA. EOMi. Alert. Oriented x3 Psych: Normal affect, dress and demeanor. Normal speech. Normal thought content and judgment.  No exam data present No results found. No results found for this or any previous visit (from the past 24 hour(s)).  Assessment/Plan: Brandon Haynes is a 79 y.o. male present for OV for  Hypotension due to hypovolemia/ Infectious diarrhea/H/O Clostridium difficile infection Concern for sepsis vs hypovolemia as cause of hypotension. Recently treated c.diff 1  Month ago- reoccurrence of symptom 1 week ago.  He is not tolerating much in the way of PO  - offered EMS- he wanted his wife to drive him.  - Will call ahead to [facility] and let them know he is on the way. He will need emergent labs, +/- abd image, anti nausea med and IV hydration.  - follow up closely w/ PCP recommended    Reviewed expectations re: course of current medical issues.  Discussed self-management of symptoms.  Outlined signs and symptoms indicating need for more acute intervention.  Patient verbalized understanding and all questions were answered.  Patient received an After-Visit Summary.    No orders of the defined types were placed in this encounter.  No orders of the defined types were placed in this encounter.  Referral Orders  No referral(s)  requested today     Note is dictated utilizing voice recognition software. Although note has been proof read prior to signing, occasional typographical errors still can be missed. If any questions arise, please do not hesitate to call for verification.   electronically signed by:  Howard Pouch, DO  [place]

## 2020-02-19 NOTE — ED Triage Notes (Signed)
Pt c/ sudden onset of blurred vision x 1 hr ago , also c/o diarrhea x 1 week , SOB x 2 days

## 2020-02-19 NOTE — ED Provider Notes (Signed)
[facility] EMERGENCY DEPARTMENT Provider Note   CSN: [id number] Arrival date & time: 02/19/20  1515     History Chief Complaint  Patient presents with   Blurred Vision    Brandon Haynes is a 79 y.o. male.  HPI Brandon Haynes is a 79 y.o. male with a past medical history significant for hypertension, headache, bladder cancer status post ileal conduit placement, CAD status post PCI, GERD, and chronic low back degenerative disease who presents to the emergency department today for evaluation of fatigue, weakness, abdominal pain, diarrhea, lightheadedness, dizziness.  Patient reports that his diarrhea started about a week ago.  Of note patient was treated for C. difficile beginning of April 2021.  Patient was on 10 days of oral vancomycin and symptoms improved.  Patient states that over the past week he has had increased diarrhea with a daily bowel movements about 14-15 times a day.  Patient reports generalized lower abdominal discomfort.  Patient reports some shortness of breath and some retrosternal chest pain.  Patient reports mild cough.  No fevers or chills.  Patient does have a history of ileal conduit.  Patient has had very poor p.o. intake over the past several days.  Patient was seen by his primary care office today where he was noted to be tachycardic and hypotensive and reported to the ER for further evaluation.  Patient does have history of bladder cancer but is not on any chemotherapy or radiation.  Patient denies any melena hematochezia.  No nausea or vomiting.  Patient states on the way here he had an episode of blurry vision.  He is unsure if this was coming from his glasses fogging up from the mask.  Patient states that    Past Medical History:  Diagnosis Date   Allergic rhinitis    allergy shots via [place]   Anxiety    Bladder cancer ([place]) 05/2019   INVASIVE HIGH GRADE PAPILLARY UROTHELIAL CARCINOMA, invading muscularis propia.  Lesion resected. No mets.  He got  neoadjuvant chemo x 4 cycles.  Cystoprostectomy with ileal conduit formation 12/13/19 (Dr. Tresa Moore).   Bradycardia, drug induced 09/08/2016   C. difficile colitis 01/09/2020   ED dx->oral vancomycin   CAD, multiple vessel    a. CP/near-syncope 11/2014 s/p DES to MLAD, occluded RCA after takeoff of RV marginal with collaterals treated medically, otherwise mild nonobstructive disease. EF 55%;  b. 07/2015 Lexiscan CL: EF 56%, no ischemia/infarct.    Cataracts, both eyes    Chronic low back pain    DDD (degenerative disc disease), lumbar    GERD (gastroesophageal reflux disease)    PPI bid needed to control sx's   Gout    Hearing loss    History  of basal cell carcinoma    History of adenomatous polyp of colon    Rpt colonoscopy recommended 02/2020->no further if no high risk lesions at that time.   Hypercholesteremia    Hypertension    Hx labile/uncontrolled.  Improved s/p visit to HTN clinic 2019.   Iron deficiency anemia 2017   Colonoscopy, EGD, and capsule endoscopy w/out obvious cause/source found.  Oral iron helping as of 03/2016.   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 No avid enhancement or mass effect. Obs  by urol as of 10/2019.    Patient Active Problem List   Diagnosis Date Noted   Bladder cancer ([place]) 12/13/2019   Port-A-Cath in place 10/04/2019   Invasive carcinoma of urinary bladder ([place]) 06/27/2019   Recurrent epistaxis 10/19/2017   Bradycardia, drug induced 09/08/2016   Bilateral sensorineural hearing loss 06/30/2016   Subjective tinnitus, bilateral 06/30/2016   Prediabetes 04/16/2016   Leg pain 03/06/2016   Dyspnea on exertion 08/23/2015   Dizziness 08/23/2015   Rash 08/23/2015   Coronary artery disease involving  native coronary artery of native heart without angina pectoris    Syncope 12/03/2014   Osteoarthritis of multiple joints 10/18/2014   Symptomatic hypotension 03/22/2014   Goals of care, counseling/discussion 03/22/2014   Encounter to establish care with new doctor 02/26/2014   Rotator cuff tear 08/28/2013   GERD 08/29/2010   HEARING LOSS 06/21/2008   PERIPHERAL VASCULAR DISEASE 06/21/2008   ALLERGIC RHINITIS 06/21/2008   BASAL CELL CARCINOMA, HX OF 06/21/2008   COLONIC POLYPS 03/29/2008   HYPERCHOLESTEROLEMIA 03/29/2008   GOUT 03/29/2008   ANXIETY 03/29/2008   Essential hypertension 03/29/2008   DEGENERATIVE [place] DISEASE 03/29/2008   BACK PAIN, LUMBAR 03/29/2008    Past Surgical History:  Procedure Laterality Date   back injection  oct 2011, 07/2010, 12/2012   no surgery   Capsule endoscopy  02/2016   mild duodenitis, o/w NEG   CARDIOVASCULAR STRESS TEST  05/2009; 07/2015   carotid dopplers  11/2014   Bilateral - 1% to 9% ICA stenosis. Vertebral artery flow is   COLONOSCOPY  08/2008; 02/2016; 02/2017   2017: 12 polyps (adenomatous).  02/2017 repeat TCS--multiple polyps: recall 3 yrs.   CORONARY ANGIOPLASTY WITH STENT PLACEMENT  12/04/2014   4.0 x 12 mm Synergy stent to the mid LAD   CYSTOSCOPY  05/24/2019   CYSTOSCOPY W/ TRANSURETHRAL RESECTION OF POSTERIOR URETHERAL VALVES  06/12/2019   2-5cm   CYSTOSCOPY WITH INJECTION N/A 12/13/2019   Procedure: CYSTOSCOPY WITH INJECTION;  Surgeon: [name], MD;  Location: [facility];  Service: Urology;  Laterality: N/A;   ESOPHAGOGASTRODUODENOSCOPY  02/2016   Gastritis.  NEG h pylori.     IR IMAGING GUIDED PORT INSERTION  09/12/2019   LE arterial vascular study  02/2016   Per Dr. Radford Pax, no evidence of peripheral vascular disease   LEFT HEART CATHETERIZATION WITH CORONARY ANGIOGRAM N/A 12/04/2014   Procedure: LEFT HEART CATHETERIZATION WITH CORONARY ANGIOGRAM;  Surgeon: Jolaine Artist, MD; LAD 99% then  aneurysmal, OM1 40%, CFX 50%, RCA 40%, 50% then occluded   LYMPHADENECTOMY Bilateral 12/13/2019   Procedure: LYMPHADENECTOMY;  Surgeon: [name], MD;  Location: [facility];  Service: Urology;  Laterality: Bilateral;   ROBOT ASSISTED LAPAROSCOPIC COMPLETE CYSTECT ILEAL CONDUIT N/A 12/13/2019   Procedure: XI ROBOTIC ASSISTED LAPAROSCOPIC COMPLETE CYSTECT ILEAL CONDUIT AND RADICAL PROSTATECTOMY;  Surgeon: [name], MD;  Location: [facility];  Service: Urology;  Laterality: N/A;  6 HRS   ROTATOR CUFF REPAIR Left 09/2013   left   SHOULDER INJECTION  feb 2014   TRANSTHORACIC ECHOCARDIOGRAM  11/2014   EF 50-55%, some wall motion abnormalities noted, grade I diast dysfxn   TRANSURETHRAL RESECTION OF BLADDER TUMOR N/A 06/12/2019   INVASIVE HIGH GRADE PAPILLARY UROTHELIAL CARCINOMA, invading muscularis propria.  Procedure: TRANSURETHRAL RESECTION OF BLADDER TUMOR (TURBT);  Surgeon: Lucas Mallow, MD;  Location: [facility];  Service: Urology;  Laterality: N/A;       Family History  Problem Relation Age of Onset  Lung cancer Father    Heart disease Father    Breast cancer Mother    Congestive Heart Failure Mother    Diabetes Mother    Breast cancer Sister    Heart disease Brother    COPD Brother    Bladder Cancer Maternal Grandfather    Colon cancer Neg Hx    Esophageal cancer Neg Hx    Pancreatic cancer Neg Hx    Rectal cancer Neg Hx    Stomach cancer Neg Hx    Prostate cancer Neg Hx     Social History   Tobacco Use   Smoking status: Former Smoker    Packs/day: 2.00    Years: 35.00    Pack years: 70.00    Types: Cigarettes    Quit date: 09/28/1998    Years since quitting: 21.4   Smokeless tobacco: Former Systems developer    Types: Snuff    Quit date: 02/09/1986   Tobacco comment: as a teenager  Substance Use Topics   Alcohol use: No    Alcohol/week: 0.0 standard drinks    Comment: quit 1994   Drug use: No    Home Medications Prior to Admission medications     Medication Sig Start Date End Date Taking? Authorizing Provider  allopurinol (ZYLOPRIM) 300 MG tablet TAKE 1 TABLET BY MOUTH EVERY DAY 02/15/20   McGowen, [name], MD  amLODipine (NORVASC) 5 MG tablet TAKE 1 TABLET BY MOUTH EVERY DAY 08/21/19   McGowen, [name], MD  aspirin EC 81 MG tablet Take 81 mg by mouth daily.    [provider]  desonide (DESOWEN) 0.05 % cream Apply 1 application topically 3 (three) times a week.  02/11/15   [provider]  diphenhydrAMINE (BENADRYL) 25 MG tablet Take 50 mg by mouth 3 (three) times daily as needed for itching or allergies.     [provider]  EPINEPHrine 0.3 mg/0.3 mL IJ SOAJ injection Inject 0.3 mg into the muscle as needed for anaphylaxis.  10/04/17   [provider]  fluticasone (FLONASE) 50 MCG/ACT nasal spray Place 1 spray into both nostrils daily.    [provider]  gabapentin (NEURONTIN) 300 MG capsule Take 300-600 mg by mouth See admin instructions. Take 2 capsules (600 mg) by mouth in the morning & take 1 capsule (300 mg) by mouth in the afternoon.    [provider]  loratadine (CLARITIN) 10 MG tablet Take 20 mg by mouth daily.     [provider]  losartan (COZAAR) 50 MG tablet Take 1 tablet (50 mg total) by mouth 2 (two) times daily. Please make yearly appt with Dr. Radford Pax for June for future refills. 1st attempt Patient not taking: Reported on 02/19/2020 11/14/19   Jerline Pain, MD  metoprolol tartrate (LOPRESSOR) 25 MG tablet Take 1 tablet (25 mg total) by mouth 2 (two) times daily. 03/09/19   Sueanne Margarita, MD  nitroGLYCERIN (NITROSTAT) 0.4 MG SL tablet PLACE 1 TABLET UNDER TONGUE EVERY 5 MINS, UP TO 3 DOSES AS NEEDED FOR CHEST PAIN Patient not taking: Reported on 01/03/2020 12/28/19   Sueanne Margarita, MD  oxybutynin (DITROPAN) 5 MG tablet Take 5 mg by mouth 3 (three) times daily. 12/28/19   [provider]  pantoprazole (PROTONIX) 40 MG tablet Take 1 tablet (40 mg total) by  mouth daily. 03/21/19   Armbruster, Carlota Raspberry, MD  PREVIDENT 5000 BOOSTER PLUS 1.1 % PSTE Take 1 application by mouth at bedtime.  10/27/14  [provider]  rosuvastatin (CRESTOR) 20 MG tablet TAKE 1 TABLET BY MOUTH EVERY DAY Patient taking differently: Take 20 mg by mouth every evening.  07/20/19   McGowen, [name], MD  traMADol (ULTRAM) 50 MG tablet Take 50 mg by mouth 3 (three) times daily as needed. 01/05/20   [provider]    Allergies    Patient has no known allergies.  Review of Systems   Review of Systems  Constitutional: Positive for fatigue. Negative for chills and fever.  HENT: Negative for congestion.   Eyes: Positive for visual disturbance. Negative for discharge.  Respiratory: Positive for shortness of breath.   Cardiovascular: Positive for chest pain.  Gastrointestinal: Positive for abdominal pain, diarrhea and nausea. Negative for blood in stool and vomiting.  Genitourinary: Negative for hematuria.  Musculoskeletal: Negative for myalgias.  Skin: Negative for color change.  Neurological: Positive for weakness. Negative for headaches.  Psychiatric/Behavioral: Negative for confusion.    Physical Exam Updated Vital Signs BP (!) 123/91    Pulse (!) 120    Resp 17    Ht 6\' 2"  (1.88 m)    Wt 93 kg    SpO2 100%    BMI 26.32 kg/m   Physical Exam Vitals and nursing note reviewed.  Constitutional:      General: He is not in acute distress.    Appearance: He is well-developed. He is not ill-appearing or toxic-appearing.  HENT:     Head: Normocephalic and atraumatic.     Mouth/Throat:     Mouth: Mucous membranes are dry.     Pharynx: Oropharynx is clear.  Eyes:     General:        Right eye: No discharge.        Left eye: No discharge.     Conjunctiva/sclera: Conjunctivae normal.     Pupils: Pupils are equal, round, and reactive to light.  Cardiovascular:     Rate and Rhythm: Regular rhythm. Tachycardia present.     Heart sounds: Normal heart  sounds. No murmur. No friction rub. No gallop.   Pulmonary:     Effort: Pulmonary effort is normal. No respiratory distress.     Breath sounds: Normal breath sounds. No stridor. No wheezing, rhonchi or rales.  Chest:     Chest wall: No tenderness.  Abdominal:     General: Abdomen is flat. Bowel sounds are normal. There is no distension.     Palpations: Abdomen is soft.     Tenderness: There is no abdominal tenderness. There is no right CVA tenderness, left CVA tenderness, guarding or rebound.     Comments: Patient with pink appearing stoma to the right lower quadrant with known ileal conduit.  Urine is cloudy.  Musculoskeletal:        General: No tenderness. Normal range of motion.     Cervical back: Normal range of motion and neck supple.  Lymphadenopathy:     Cervical: No cervical adenopathy.  Skin:    General: Skin is warm and dry.     Capillary Refill: Capillary refill takes less than 2 seconds.     Findings: No rash.  Neurological:     Mental Status: He is alert and oriented to person, place, and time.     Comments: The patient is alert, attentive, and oriented x 3. Speech is clear. Cranial nerve II-VII grossly intact. Negative pronator drift. Sensation intact. Strength 5/5 in all extremities. Reflexes 2+ and symmetric at biceps, triceps, knees, and ankles. Rapid alternating  movement and fine finger movements intact.    Psychiatric:        Behavior: Behavior normal.        Thought Content: Thought content normal.        Judgment: Judgment normal.     ED Results / Procedures / Treatments   Labs (all labs ordered are listed, but only abnormal results are displayed) Labs Reviewed  CBC WITH DIFFERENTIAL/PLATELET - Abnormal; Notable for the following components:      Result Value   WBC 29.6 (*)    RDW 15.9 (*)    Neutro Abs 23.9 (*)    Monocytes Absolute 2.1 (*)    Abs Immature Granulocytes 0.22 (*)    All other components within normal limits  COMPREHENSIVE METABOLIC  PANEL - Abnormal; Notable for the following components:   Sodium 132 (*)    CO2 19 (*)    Glucose, Bld 180 (*)    Creatinine, Ser 1.52 (*)    Calcium 8.3 (*)    Total Protein 5.6 (*)    Albumin 2.7 (*)    AST 12 (*)    GFR calc non Af Amer 44 (*)    GFR calc Af Amer 50 (*)    All other components within normal limits  URINALYSIS, ROUTINE W REFLEX MICROSCOPIC - Abnormal; Notable for the following components:   APPearance CLOUDY (*)    Hgb urine dipstick TRACE (*)    Protein, ur 30 (*)    Leukocytes,Ua LARGE (*)    All other components within normal limits  URINALYSIS, MICROSCOPIC (REFLEX) - Abnormal; Notable for the following components:   Bacteria, UA MANY (*)    All other components within normal limits  CBG MONITORING, ED - Abnormal; Notable for the following components:   Glucose-Capillary 183 (*)    All other components within normal limits  C DIFFICILE QUICK SCREEN W PCR REFLEX  GASTROINTESTINAL PANEL BY PCR, STOOL (REPLACES STOOL CULTURE)  CULTURE, BLOOD (ROUTINE X 2)  CULTURE, BLOOD (ROUTINE X 2)  URINE CULTURE  SARS CORONAVIRUS 2 BY RT PCR (HOSPITAL ORDER, [facility])  LACTIC ACID, PLASMA  APTT  PROTIME-INR  LACTIC ACID, PLASMA  CBG MONITORING, ED  TROPONIN I (HIGH SENSITIVITY)    EKG None EKG: sinus tachycardia.    Radiology CT Angio Chest PE W and/or Wo Contrast  Result Date: 02/19/2020 CLINICAL DATA:  Weakness. EXAM: CT ANGIOGRAPHY CHEST WITH CONTRAST TECHNIQUE: Multidetector CT imaging of the chest was performed using the standard protocol during bolus administration of intravenous contrast. Multiplanar CT image reconstructions and MIPs were obtained to evaluate the vascular anatomy. CONTRAST:  116mL OMNIPAQUE IOHEXOL 350 MG/ML SOLN COMPARISON:  June 02, 2005 FINDINGS: Cardiovascular: A right-sided venous Port-A-Cath is in place. There is moderate severity calcification of the thoracic aorta. Satisfactory opacification of the  pulmonary arteries to the segmental level. No evidence of pulmonary embolism. Normal heart size. No pericardial effusion. Marked severity coronary artery calcification is seen. Mediastinum/Nodes: No enlarged mediastinal, hilar, or axillary lymph nodes. Thyroid gland, trachea, and esophagus demonstrate no significant findings. Lungs/Pleura: Lungs are clear. No pleural effusion or pneumothorax. Upper Abdomen: A moderate amount of free fluid is seen. Marked severity diffuse colonic wall thickening is present. Musculoskeletal: No chest wall abnormality. No acute or significant osseous findings. Review of the MIP images confirms the above findings. IMPRESSION: 1. No evidence of pulmonary embolus or acute cardiopulmonary disease. 2. Marked severity diffuse colonic wall thickening, consistent with colitis. 3. Moderate amount  of free fluid in the abdomen. 4. Marked severity coronary artery calcification. Aortic Atherosclerosis (ICD10-I70.0). Electronically Signed   By: Virgina Norfolk M.D.   On: 02/19/2020 16:57   CT ABDOMEN PELVIS W CONTRAST  Result Date: 02/19/2020 CLINICAL DATA:  Weakness. EXAM: CT ABDOMEN AND PELVIS WITH CONTRAST TECHNIQUE: Multidetector CT imaging of the abdomen and pelvis was performed using the standard protocol following bolus administration of intravenous contrast. CONTRAST:  134mL OMNIPAQUE IOHEXOL 350 MG/ML SOLN COMPARISON:  None. FINDINGS: Lower chest: No acute abnormality. Hepatobiliary: No focal liver abnormality is seen. No gallstones, gallbladder wall thickening, or biliary dilatation. Pancreas: Unremarkable. No pancreatic ductal dilatation or surrounding inflammatory changes. Spleen: Normal in size without focal abnormality. Adrenals/Urinary Tract: Adrenal glands are unremarkable. Kidneys are normal, without renal calculi, focal lesion, or hydronephrosis. The urinary bladder is absent. Stomach/Bowel: Stomach is within normal limits. Surgically anastomosed bowel is seen within the  right hemipelvis. There is no evidence of bowel dilatation. Marked severity diffuse colonic wall thickening is seen. This involves all portions of the large bowel. A moderate amount of pericolonic inflammatory fat stranding is present. Vascular/Lymphatic: Moderate to marked severity calcification of the abdominal aorta is seen without evidence of aneurysmal dilatation or dissection. No enlarged abdominal or pelvic lymph nodes. Reproductive: The prostate gland is absent. Other: A right lower quadrant ostomy site is seen. A moderate amount of free fluid is seen within the abdomen. A mild amount of pelvic free fluid is also noted. No intra-abdominal free air is identified. Musculoskeletal: Multilevel degenerative changes are seen throughout the lumbar spine. IMPRESSION: 1. Marked severity diffuse colonic wall thickening, consistent with an infectious or inflammatory process. 2. Moderate amount of free fluid within the abdomen and pelvis. Aortic Atherosclerosis (ICD10-I70.0). Electronically Signed   By: Virgina Norfolk M.D.   On: 02/19/2020 17:02   DG Chest Port 1 View  Result Date: 02/19/2020 CLINICAL DATA:  Shortness of breath x2 days, recent C diff EXAM: PORTABLE CHEST 1 VIEW COMPARISON:  01/08/2020 FINDINGS: Lungs are clear.  No pleural effusion or pneumothorax. Heart is normal in size.  Thoracic aortic atherosclerosis. Right chest port terminates in the upper right atrium. IMPRESSION: No evidence of acute cardiopulmonary disease. Electronically Signed   By: Julian Hy M.D.   On: 02/19/2020 16:00    Procedures .Critical Care Performed by: Doristine Devoid, PA-C Authorized by: Blanchie Dessert, MD   Critical care provider statement:    Critical care time (minutes):  50   Critical care was necessary to treat or prevent imminent or life-threatening deterioration of the following conditions:  Sepsis and shock   Critical care was time spent personally by me on the following activities:   Development of treatment plan with patient or surrogate, discussions with consultants, discussions with primary provider, evaluation of patient's response to treatment, ordering and performing treatments and interventions, ordering and review of laboratory studies, ordering and review of radiographic studies, pulse oximetry, re-evaluation of patient's condition and review of old charts   (including critical care time)  Medications Ordered in ED Medications  lactated ringers bolus 1,000 mL (1,000 mLs Intravenous New Bag/Given 02/19/20 1613)    And  lactated ringers bolus 1,000 mL (1,000 mLs Intravenous New Bag/Given 02/19/20 1614)    And  lactated ringers bolus 1,000 mL (has no administration in time range)  iohexol (OMNIPAQUE) 350 MG/ML injection 100 mL (100 mLs Intravenous Contrast Given 02/19/20 1639)    ED Course  I have reviewed the triage vital signs and the nursing  notes.  Pertinent labs & imaging results that were available during my care of the patient were reviewed by me and considered in my medical decision making (see chart for details).    MDM Rules/Calculators/A&P                      79 year old male presents the ER for evaluation of hypotension, tachycardia, generalized fatigue weakness, diarrhea and abdominal pain.  Patient has known history of C. difficile last month.  Was treated with 10 days of vancomycin.  Patient noted to be hypotensive on arrival to the ER tachycardic.  No significant hypoxia noted.  Patient afebrile in the ER.  Given vital signs and concern for infection sepsis protocol was initiated.  Patient started on 30 cc/kg fluid bolus.  Have high suspicion for C. difficile given patient's clinical history.  Unable to provide oral vancomycin in the ER at this time.  Do not have another source of infection to provide IV antibiotics at this time.  Patient was reassessed several times throughout his ER stay.  Blood pressure have improved with IV hydration.  Patient's  heart rate does remain elevated in the 120s however patient is on Lopressor at baseline which she has not taken for the past 3 days given his hypotension.  Patient could have a reflex tachycardia.  Patient does have a leukocytosis of 30,000.  No significant electrolyte derangement.  Kidney function is mildly increased at 1.52 likely secondary to dehydration.  Patient's bicarb is 19 with a glucose of 180.  No signs of DKA.  Coags are normal.  COVID-19 test is negative.  Lactic acid was normal.  EKG was performed that shows sinus tachycardia with a heart rate of 125 bpm.  Given patient's tachycardia, chest pain or shortness of breath CT PE study was performed that shows no evidence of PE or pneumonia.  CT abdomen pelvis performed shows diffuse colitis with some free fluid in the abdomen.  No signs of obstruction.  Again concern for C. difficile colitis.  Stool cultures are pending at this time.  Patient's urine does show signs of infection however does have a history of chronic infection given the ileoconduit.  I suspect the patient's blurry vision, chest pain or shortness breath likely secondary to profound hypotension.  Patient has no focal neurological deficit to suspect any CVA.  Patient's EKG shows no ischemic changes.  Troponin is pending at this time.  No signs of PE.  Will need to be admitted for sepsis secondary to likely C. difficile colitis.  Case discussed with Dr. Cathlean Sauer with hospital medicine.  Does agree to admission.  Waiting for transfer at this time.  Has recommend that we start patient on IV Flagyl.  This was started in the ER. Final Clinical Impression(s) / ED Diagnoses Final diagnoses:  Sepsis without acute organ dysfunction, due to unspecified organism [facility])  Colitis    Rx / DC Orders ED Discharge Orders    None       Aaron Edelman 02/19/20 1756    Blanchie Dessert, MD 02/20/20 2055

## 2020-02-19 NOTE — Progress Notes (Addendum)
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   Patient presented to his primary care provider with diffuse, profuse and persistent watery diarrhea since 02/11/2020. Positive generalized weakness and malaise.  He was recently diagnosed with C. difficile diarrhea in April 12//2021, and he was treated for 10 days with oral vancomycin.  Patient has history of bladder cancer, status post robotic  cystoprostatectomy December 13, 2019.  He was found to be hypotensive and was referred to [facility] ED.  On his initial physical examination in the ED he was hypotensive 85/64, tachycardic at 119 bpm, with respiratory rate of 28. Work-up including CT of the abdomen showed marked severity diffuse colonic wall thickening, involving all portions of the large bowel.  Moderate amount of free fluid within the abdomen and pelvis. His white cell count is 29.6, hemoglobin 13.8, his creatinine 1.52 with a bicarb of 19 and potassium 4.1.  Patient has received 3 L of lactated Ringer's intravenously and his blood pressure has improved to 102/56, but he continued to be tachycardic 117 bpm his respiratory rate is down to 17. No oral vancomycin available at [facility], he will received intravenous metronidazole and will be transfer to [facility] for further management.  Patient has been accepted to progressive care unit.

## 2020-02-19 NOTE — ED Notes (Signed)
[facility] notified (Tammy) - patient ready for transport 

## 2020-02-20 ENCOUNTER — Encounter (HOSPITAL_COMMUNITY): Payer: Self-pay | Admitting: Internal Medicine

## 2020-02-20 ENCOUNTER — Telehealth: Payer: Self-pay

## 2020-02-20 DIAGNOSIS — A419 Sepsis, unspecified organism: Secondary | ICD-10-CM

## 2020-02-20 HISTORY — DX: Sepsis, unspecified organism: A41.9

## 2020-02-20 LAB — CBC WITH DIFFERENTIAL/PLATELET
Abs Immature Granulocytes: 0.16 10*3/uL — ABNORMAL HIGH (ref 0.00–0.07)
Basophils Absolute: 0.1 10*3/uL (ref 0.0–0.1)
Basophils Relative: 0 %
Eosinophils Absolute: 0 10*3/uL (ref 0.0–0.5)
Eosinophils Relative: 0 %
HCT: 40.1 % (ref 39.0–52.0)
Hemoglobin: 12.9 g/dL — ABNORMAL LOW (ref 13.0–17.0)
Immature Granulocytes: 1 %
Lymphocytes Relative: 13 %
Lymphs Abs: 3.2 10*3/uL (ref 0.7–4.0)
MCH: 29.1 pg (ref 26.0–34.0)
MCHC: 32.2 g/dL (ref 30.0–36.0)
MCV: 90.5 fL (ref 80.0–100.0)
Monocytes Absolute: 2 10*3/uL — ABNORMAL HIGH (ref 0.1–1.0)
Monocytes Relative: 8 %
Neutro Abs: 19.8 10*3/uL — ABNORMAL HIGH (ref 1.7–7.7)
Neutrophils Relative %: 78 %
Platelets: 223 10*3/uL (ref 150–400)
RBC: 4.43 MIL/uL (ref 4.22–5.81)
RDW: 15.8 % — ABNORMAL HIGH (ref 11.5–15.5)
WBC: 25.2 10*3/uL — ABNORMAL HIGH (ref 4.0–10.5)
nRBC: 0 % (ref 0.0–0.2)

## 2020-02-20 LAB — URINE CULTURE

## 2020-02-20 LAB — GASTROINTESTINAL PANEL BY PCR, STOOL (REPLACES STOOL CULTURE)

## 2020-02-20 LAB — LACTIC ACID, PLASMA
Lactic Acid, Venous: 1.3 mmol/L (ref 0.5–1.9)
Lactic Acid, Venous: 1 mmol/L (ref 0.5–1.9)

## 2020-02-20 LAB — MRSA PCR SCREENING: MRSA by PCR: NEGATIVE

## 2020-02-20 LAB — C DIFFICILE QUICK SCREEN W PCR REFLEX
C Diff antigen: NEGATIVE
C Diff toxin: POSITIVE — AB

## 2020-02-20 LAB — COMPREHENSIVE METABOLIC PANEL
ALT: 8 U/L (ref 0–44)
AST: 11 U/L — ABNORMAL LOW (ref 15–41)
Albumin: 2.3 g/dL — ABNORMAL LOW (ref 3.5–5.0)
Alkaline Phosphatase: 47 U/L (ref 38–126)
Anion gap: 8 (ref 5–15)
BUN: 19 mg/dL (ref 8–23)
CO2: 20 mmol/L — ABNORMAL LOW (ref 22–32)
Calcium: 8 mg/dL — ABNORMAL LOW (ref 8.9–10.3)
Chloride: 106 mmol/L (ref 98–111)
Creatinine, Ser: 1.08 mg/dL (ref 0.61–1.24)
GFR calc Af Amer: >60 mL/min (ref >60)
GFR calc non Af Amer: >60 mL/min (ref >60)
Glucose, Bld: 120 mg/dL — ABNORMAL HIGH (ref 70–99)
Potassium: 4.3 mmol/L (ref 3.5–5.1)
Sodium: 134 mmol/L — ABNORMAL LOW (ref 135–145)
Total Bilirubin: 0.7 mg/dL (ref 0.3–1.2)
Total Protein: 4.7 g/dL — ABNORMAL LOW (ref 6.5–8.1)

## 2020-02-20 LAB — PROTIME-INR
INR: 1.2 (ref 0.8–1.2)
Prothrombin Time: 14.5 seconds (ref 11.4–15.2)

## 2020-02-20 LAB — TROPONIN I (HIGH SENSITIVITY): Troponin I (High Sensitivity): 10 ng/L (ref <18)

## 2020-02-20 LAB — CLOSTRIDIUM DIFFICILE BY PCR, REFLEXED: Toxigenic C. Difficile by PCR: POSITIVE — AB

## 2020-02-20 LAB — APTT: aPTT: 28 seconds (ref 24–36)

## 2020-02-20 MED ORDER — ALLOPURINOL 300 MG PO TABS
300.0000 mg | ORAL_TABLET | Freq: Every day | ORAL | Status: DC
  Administered 2020-02-20 – 2020-02-26 (×7): 300 mg via ORAL
  Filled 2020-02-20 (×2): qty 1
  Filled 2020-02-20: qty 3
  Filled 2020-02-20: qty 1
  Filled 2020-02-20: qty 3
  Filled 2020-02-20 (×2): qty 1

## 2020-02-20 MED ORDER — ORAL CARE MOUTH RINSE: 15.0000 mL | Freq: Two times a day (BID) | OROMUCOSAL | Status: DC

## 2020-02-20 MED ORDER — SODIUM CHLORIDE 0.9% FLUSH
10.0000 mL | INTRAVENOUS | Status: DC | PRN
  Administered 2020-02-26: 10 mL

## 2020-02-20 MED ORDER — SODIUM CHLORIDE 0.9% FLUSH
10.0000 mL | Freq: Two times a day (BID) | INTRAVENOUS | Status: DC
  Administered 2020-02-20 – 2020-02-25 (×6): 10 mL

## 2020-02-20 MED ORDER — LACTATED RINGERS IV BOLUS
1000.0000 mL | Freq: Once | INTRAVENOUS | Status: AC
  Administered 2020-02-20: 1000 mL via INTRAVENOUS

## 2020-02-20 MED ORDER — ENOXAPARIN SODIUM 40 MG/0.4ML ~~LOC~~ SOLN: 40.0000 mg | SUBCUTANEOUS | Status: DC

## 2020-02-20 MED ORDER — ALBUTEROL SULFATE (2.5 MG/3ML) 0.083% IN NEBU: 3.0000 mL | INHALATION_SOLUTION | Freq: Every day | RESPIRATORY_TRACT | Status: DC | PRN

## 2020-02-20 MED ORDER — CHLORHEXIDINE GLUCONATE CLOTH 2 % EX PADS
6.0000 | MEDICATED_PAD | Freq: Every day | CUTANEOUS | Status: DC
  Administered 2020-02-20 – 2020-02-26 (×6): 6 via TOPICAL

## 2020-02-20 MED ORDER — ROSUVASTATIN CALCIUM 20 MG PO TABS
20.0000 mg | ORAL_TABLET | Freq: Every evening | ORAL | Status: DC
  Administered 2020-02-20 – 2020-02-25 (×6): 20 mg via ORAL
  Filled 2020-02-20 (×6): qty 1

## 2020-02-20 MED ORDER — ONDANSETRON HCL 4 MG PO TABS: 4.0000 mg | ORAL_TABLET | Freq: Four times a day (QID) | ORAL | Status: DC | PRN

## 2020-02-20 MED ORDER — GABAPENTIN 300 MG PO CAPS: 300.0000 mg | ORAL_CAPSULE | ORAL | Status: DC

## 2020-02-20 MED ORDER — ENOXAPARIN SODIUM 40 MG/0.4ML ~~LOC~~ SOLN
40.0000 mg | SUBCUTANEOUS | Status: DC
  Administered 2020-02-20 – 2020-02-26 (×7): 40 mg via SUBCUTANEOUS
  Filled 2020-02-20 (×7): qty 0.4

## 2020-02-20 MED ORDER — LIP MEDEX EX OINT: TOPICAL_OINTMENT | CUTANEOUS | Status: DC | PRN

## 2020-02-20 MED ORDER — ASPIRIN EC 81 MG PO TBEC
81.0000 mg | DELAYED_RELEASE_TABLET | Freq: Every day | ORAL | Status: DC
  Administered 2020-02-20 – 2020-02-26 (×7): 81 mg via ORAL
  Filled 2020-02-20 (×7): qty 1

## 2020-02-20 MED ORDER — GABAPENTIN 300 MG PO CAPS
600.0000 mg | ORAL_CAPSULE | Freq: Every morning | ORAL | Status: DC
  Administered 2020-02-20 – 2020-02-26 (×7): 600 mg via ORAL
  Filled 2020-02-20 (×7): qty 2

## 2020-02-20 MED ORDER — METOPROLOL TARTRATE 25 MG PO TABS
12.5000 mg | ORAL_TABLET | Freq: Two times a day (BID) | ORAL | Status: DC
  Administered 2020-02-20 – 2020-02-26 (×12): 12.5 mg via ORAL
  Filled 2020-02-20 (×12): qty 1

## 2020-02-20 MED ORDER — ONDANSETRON HCL 4 MG/2ML IJ SOLN
4.0000 mg | Freq: Four times a day (QID) | INTRAMUSCULAR | Status: DC | PRN
  Administered 2020-02-20: 4 mg via INTRAVENOUS
  Filled 2020-02-20: qty 2

## 2020-02-20 MED ORDER — FLUTICASONE PROPIONATE 50 MCG/ACT NA SUSP
1.0000 | Freq: Every day | NASAL | Status: DC
  Administered 2020-02-20 – 2020-02-26 (×5): 1 via NASAL
  Filled 2020-02-20 (×2): qty 16

## 2020-02-20 MED ORDER — GABAPENTIN 300 MG PO CAPS
300.0000 mg | ORAL_CAPSULE | Freq: Every day | ORAL | Status: DC
  Administered 2020-02-20 – 2020-02-25 (×6): 300 mg via ORAL
  Filled 2020-02-20 (×6): qty 1

## 2020-02-20 NOTE — Progress Notes (Signed)
Notified Dr. Nevada Crane in regards to pt HR now 128-130. Pt not symptomatic. MD wants to continue fluids

## 2020-02-20 NOTE — H&P (Signed)
History and Physical    Brandon Haynes [id number] DOB: [birth year] DOA: 02/19/2020  PCP: Tammi Sou, MD  Patient coming from: Home.  Chief Complaint: Weakness diarrhea blurred vision.  HPI: Brandon Haynes is a 79 y.o. male with history of CAD, bladder cancer status post ileal conduit, hypertension has been experiencing watery diarrhea multiple episodes over the last 1 week.  Over the last couple of days he also has some chest tightness and shortness of breath.  Feeling weak and blurred vision.  Given the symptoms patient presented to the ER at [facility].  Denies any productive cough fever chills nausea vomiting.  Diarrhea is watery.  Diffuse abdominal discomfort.  Crampy in nature. Patient was treated for C. difficile colitis in April 2 week for which patient took 10 days of vancomycin oral.  Denies taking any other antibiotics.  Denies any sick contacts or recent travel.  ED Course: In the ER patient was hypotensive was given fluid bolus following which patient responded.  CT angiogram of the chest and CT abdomen and pelvis was done.  CT angiogram of the chest was negative for pulmonary embolism.  CT abdomen pelvis shows diffuse colitis.  EKG shows sinus tachycardia.  Covid test was negative.  Labs show WBC count of 29.6 lactic acid was normal high sensitive troponin was 14.  Creatinine increased from baseline of around normal and it is around 1.5.  Blood glucose of 180.  Bicarb 19.  Urine shows WBC and large leukocyte esterase.  C. difficile came back positive.  Patient started on oral vancomycin admitted for sepsis secondary to C. difficile colitis.  Review of Systems: As per HPI, rest all negative.   Past Medical History:  Diagnosis Date  . Allergic rhinitis    allergy shots via [place]  . Anxiety   . Bladder cancer ([place]) 05/2019   INVASIVE HIGH GRADE PAPILLARY UROTHELIAL CARCINOMA, invading muscularis propia.  Lesion resected. No mets.  He got neoadjuvant chemo x 4  cycles.  Cystoprostectomy with ileal conduit formation 12/13/19 (Dr. Tresa Moore).  . 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 C. difficile colitis 01/09/2020   ED dx->oral vancomycin  . CAD, multiple vessel    a. CP/near-syncope 11/2014 s/p DES to MLAD, occluded RCA after takeoff of RV marginal with collaterals treated medically, otherwise mild nonobstructive disease. EF 55%;  b. 07/2015 Lexiscan CL: EF 56%, no ischemia/infarct.   . Cataracts, both eyes   . Chronic low back pain   . DDD (degenerative disc disease), lumbar   . GERD (gastroesophageal reflux disease)    PPI bid needed to control sx's  . Gout   . Hearing loss   . History  of basal cell carcinoma   . History of adenomatous polyp of colon    Rpt colonoscopy recommended 02/2020->no further if no high risk lesions at that time.  . Hypercholesteremia   . Hypertension    Hx labile/uncontrolled.  Improved s/p visit to HTN clinic 2019.  . Iron deficiency anemia 2017   Colonoscopy, EGD, and capsule endoscopy w/out obvious cause/source found.  Oral iron helping as of 03/2016.  . Leg pain 03/06/2016  . Nonmelanoma skin cancer   . 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 Peripheral vascular disease ([place])   . Pleural thickening    chronic  . 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 No avid enhancement or mass effect. Obs by urol as of 10/2019.  Past Surgical History:  Procedure Laterality Date  . back injection  oct 2011, 07/2010, 12/2012   no surgery  . Capsule endoscopy  02/2016   mild duodenitis, o/w NEG  . CARDIOVASCULAR STRESS TEST  05/2009; 07/2015  . carotid dopplers  11/2014   Bilateral - 1% to 9% ICA stenosis. Vertebral artery flow is  . COLONOSCOPY  08/2008; 02/2016; 02/2017   2017: 12 polyps (adenomatous).  02/2017 repeat TCS--multiple polyps: recall 3 yrs.  . CORONARY ANGIOPLASTY WITH STENT PLACEMENT  12/04/2014   4.0 x 12 mm Synergy stent to the mid LAD  . CYSTOSCOPY   05/24/2019  . CYSTOSCOPY W/ TRANSURETHRAL RESECTION OF POSTERIOR URETHERAL VALVES  06/12/2019   2-5cm  . CYSTOSCOPY WITH INJECTION N/A 12/13/2019   Procedure: CYSTOSCOPY WITH INJECTION;  Surgeon: [name], MD;  Location: [facility];  Service: Urology;  Laterality: N/A;  . ESOPHAGOGASTRODUODENOSCOPY  02/2016   Gastritis.  NEG h pylori.    . IR IMAGING GUIDED PORT INSERTION  09/12/2019  . LE arterial vascular study  02/2016   Per Dr. Radford Pax, no evidence of peripheral vascular disease  . LEFT HEART CATHETERIZATION WITH CORONARY ANGIOGRAM N/A 12/04/2014   Procedure: LEFT HEART CATHETERIZATION WITH CORONARY ANGIOGRAM;  Surgeon: Jolaine Artist, MD; LAD 99% then aneurysmal, OM1 40%, CFX 50%, RCA 40%, 50% then occluded  . LYMPHADENECTOMY Bilateral 12/13/2019   Procedure: LYMPHADENECTOMY;  Surgeon: [name], MD;  Location: [facility];  Service: Urology;  Laterality: Bilateral;  . ROBOT ASSISTED LAPAROSCOPIC COMPLETE CYSTECT ILEAL CONDUIT N/A 12/13/2019   Procedure: XI ROBOTIC ASSISTED LAPAROSCOPIC COMPLETE CYSTECT ILEAL CONDUIT AND RADICAL PROSTATECTOMY;  Surgeon: [name], MD;  Location: [facility];  Service: Urology;  Laterality: N/A;  6 HRS  . ROTATOR CUFF REPAIR Left 09/2013   left  . SHOULDER INJECTION  feb 2014  . TRANSTHORACIC ECHOCARDIOGRAM  11/2014   EF 50-55%, some wall motion abnormalities noted, grade I diast dysfxn  . TRANSURETHRAL RESECTION OF BLADDER TUMOR N/A 06/12/2019   INVASIVE HIGH GRADE PAPILLARY UROTHELIAL CARCINOMA, invading muscularis propria.  Procedure: TRANSURETHRAL RESECTION OF BLADDER TUMOR (TURBT);  Surgeon: Lucas Mallow, MD;  Location: [facility];  Service: Urology;  Laterality: N/A;     reports that he quit smoking about 21 years ago. His smoking use included cigarettes. He has a 70.00 pack-year smoking history. He quit smokeless tobacco use about 34 years ago.  His smokeless tobacco use included snuff. He reports that he does not drink alcohol or use drugs.  No  Known Allergies  Family History  Problem Relation Age of Onset  . Lung cancer Father   . Heart disease Father   . Breast cancer Mother   . Congestive Heart Failure Mother   . Diabetes Mother   . Breast cancer Sister   . Heart disease Brother   . COPD Brother   . Bladder Cancer Maternal Grandfather   . Colon cancer Neg Hx   . Esophageal cancer Neg Hx   . Pancreatic cancer Neg Hx   . Rectal cancer Neg Hx   . Stomach cancer Neg Hx   . Prostate cancer Neg Hx     Prior to Admission medications   Medication Sig Start Date End Date Taking? Authorizing Provider  albuterol (VENTOLIN HFA) 108 (90 Base) MCG/ACT inhaler Inhale 1-2 puffs into the lungs daily as needed for wheezing or shortness of breath.  02/05/20  Yes [provider]  allopurinol (ZYLOPRIM) 300 MG tablet TAKE 1 TABLET BY MOUTH  EVERY DAY 02/15/20  Yes McGowen, [name], MD  amLODipine (NORVASC) 5 MG tablet TAKE 1 TABLET BY MOUTH EVERY DAY 08/21/19  Yes McGowen, [name], MD  aspirin EC 81 MG tablet Take 81 mg by mouth daily.   Yes [provider]  desonide (DESOWEN) 0.05 % cream Apply 1 application topically 3 (three) times a week.  02/11/15  Yes [provider]  diphenhydrAMINE (BENADRYL) 25 MG tablet Take 50 mg by mouth 3 (three) times daily as needed for itching or allergies.    Yes [provider]  EPINEPHrine 0.3 mg/0.3 mL IJ SOAJ injection Inject 0.3 mg into the muscle as needed for anaphylaxis.  10/04/17  Yes [provider]  fluticasone (FLONASE) 50 MCG/ACT nasal spray Place 1 spray into both nostrils daily.   Yes [provider]  gabapentin (NEURONTIN) 300 MG capsule Take 300-600 mg by mouth See admin instructions. Take 2 capsules (600 mg) by mouth in the morning & take 1 capsule (300 mg) by mouth in the afternoon.   Yes [provider]  loratadine (CLARITIN) 10 MG tablet Take 20 mg by mouth daily.    Yes [provider]  metoprolol tartrate (LOPRESSOR)  25 MG tablet Take 1 tablet (25 mg total) by mouth 2 (two) times daily. 03/09/19  Yes Turner, Eber Hong, MD  nitroGLYCERIN (NITROSTAT) 0.4 MG SL tablet PLACE 1 TABLET UNDER TONGUE EVERY 5 MINS, UP TO 3 DOSES AS NEEDED FOR CHEST PAIN Patient taking differently: Place 0.4 mg under the tongue every 5 (five) minutes as needed for chest pain.  12/28/19  Yes Turner, Eber Hong, MD  pantoprazole (PROTONIX) 40 MG tablet Take 1 tablet (40 mg total) by mouth daily. 03/21/19  Yes Armbruster, Carlota Raspberry, MD  PREVIDENT 5000 BOOSTER PLUS 1.1 % PSTE Take 1 application by mouth at bedtime.  10/27/14  Yes [provider]  rosuvastatin (CRESTOR) 20 MG tablet TAKE 1 TABLET BY MOUTH EVERY DAY Patient taking differently: Take 20 mg by mouth every evening.  07/20/19  Yes McGowen, [name], MD  traMADol (ULTRAM) 50 MG tablet Take 50 mg by mouth 3 (three) times daily as needed for moderate pain.  01/05/20  Yes [provider]  losartan (COZAAR) 50 MG tablet Take 1 tablet (50 mg total) by mouth 2 (two) times daily. Please make yearly appt with Dr. Radford Pax for June for future refills. 1st attempt Patient not taking: Reported on 02/19/2020 11/14/19   Jerline Pain, MD    Physical Exam: Constitutional: Moderately built and nourished. Vitals:   02/20/20 0219 02/20/20 0227 02/20/20 0300 02/20/20 0304  BP: (!) 99/58  (!) 86/57 (!) 86/53  Pulse:  (!) 114 (!) 114 (!) 115  Resp:  (!) 22 18 18   Temp:      TempSrc:      SpO2:      Weight:      Height:       Eyes: Anicteric no pallor. ENMT: No discharge from the ears eyes nose or mouth. Neck: No mass felt.  No neck rigidity. Respiratory: No rhonchi or crepitations. Cardiovascular: S1-S2 heard. Abdomen: Soft mild tenderness diffusely no guarding or rigidity. Musculoskeletal: No edema. Skin: No rash. Neurologic: Alert awake oriented to time place and person.  Moves all extremities. Psychiatric: Appears normal per normal affect.   Labs on Admission: I have personally  reviewed following labs and imaging studies  CBC: Recent Labs  Lab 02/19/20 1547  WBC 29.6*  NEUTROABS 23.9*  HGB 13.8  HCT 42.4  MCV 89.3  PLT 123456   Basic Metabolic Panel: Recent Labs  Lab 02/19/20 1547  NA 132*  K 4.1  CL 101  CO2 19*  GLUCOSE 180*  BUN 22  CREATININE 1.52*  CALCIUM 8.3*   GFR: Estimated Creatinine Clearance: 47.3 mL/min (A) (by C-G formula based on SCr of 1.52 mg/dL (H)). Liver Function Tests: Recent Labs  Lab 02/19/20 1547  AST 12*  ALT 11  ALKPHOS 52  BILITOT 0.8  PROT 5.6*  ALBUMIN 2.7*   No results for input(s): LIPASE, AMYLASE in the last 168 hours. No results for input(s): AMMONIA in the last 168 hours. Coagulation Profile: Recent Labs  Lab 02/19/20 1547  INR 1.1   Cardiac Enzymes: No results for input(s): CKTOTAL, CKMB, CKMBINDEX, TROPONINI in the last 168 hours. BNP (last 3 results) No results for input(s): PROBNP in the last 8760 hours. HbA1C: No results for input(s): HGBA1C in the last 72 hours. CBG: Recent Labs  Lab 02/19/20 1523  GLUCAP 183*   Lipid Profile: No results for input(s): CHOL, HDL, LDLCALC, TRIG, CHOLHDL, LDLDIRECT in the last 72 hours. Thyroid Function Tests: No results for input(s): TSH, T4TOTAL, FREET4, T3FREE, THYROIDAB in the last 72 hours. Anemia Panel: No results for input(s): VITAMINB12, FOLATE, FERRITIN, TIBC, IRON, RETICCTPCT in the last 72 hours. Urine analysis:    Component Value Date/Time   COLORURINE YELLOW 02/19/2020 1548   APPEARANCEUR CLOUDY (A) 02/19/2020 1548   LABSPEC 1.015 02/19/2020 1548   PHURINE 6.5 02/19/2020 1548   GLUCOSEU NEGATIVE 02/19/2020 1548   HGBUR TRACE (A) 02/19/2020 1548   BILIRUBINUR NEGATIVE 02/19/2020 1548   BILIRUBINUR negative 05/04/2019 [place] 02/19/2020 1548   PROTEINUR 30 (A) 02/19/2020 1548   UROBILINOGEN 0.2 05/04/2019 1554   NITRITE NEGATIVE 02/19/2020 1548   LEUKOCYTESUR LARGE (A) 02/19/2020 1548   Sepsis Labs:  @LABRCNTIP (procalcitonin:4,lacticidven:4) ) Recent Results (from the past 240 hour(s))  Blood Culture (routine x 2)     Status: None (Preliminary result)   Collection Time: 02/19/20  3:45 PM   Specimen: BLOOD  Result Value Ref Range Status   Specimen Description BLOOD CHEST PORTA CATH  Final   Special Requests   Final    BOTTLES DRAWN AEROBIC AND ANAEROBIC Blood Culture adequate volume Performed at [facility], [place] [address] [place], [place] [address]    Culture PENDING  Incomplete   Report Status PENDING  Incomplete  SARS Coronavirus 2 by RT PCR (hospital order, performed in [facility] hospital lab) Nasopharyngeal Nasopharyngeal Swab     Status: None   Collection Time: 02/19/20  3:47 PM   Specimen: Nasopharyngeal Swab  Result Value Ref Range Status   SARS Coronavirus 2 NEGATIVE NEGATIVE Final    Comment: (NOTE) SARS-CoV-2 target nucleic acids are NOT DETECTED. The SARS-CoV-2 RNA is generally detectable in upper and lower respiratory specimens during the acute phase of infection. The lowest concentration of SARS-CoV-2 viral copies this assay can detect is 250 copies / mL. A negative result does not preclude SARS-CoV-2 infection and should not be used as the sole basis for treatment or other patient management decisions.  A negative result may occur with improper specimen collection / handling, submission of specimen other than nasopharyngeal swab, presence of viral mutation(s) within the areas targeted by this assay, and inadequate number of viral copies (<250 copies / mL). A negative result must be combined with clinical observations, patient history, and epidemiological information. Fact Sheet for Patients:  StrictlyIdeas.no Fact Sheet for Healthcare Providers: BankingDealers.co.za This test is not yet approved or cleared  by the [place] FDA and has been authorized for detection and/or diagnosis of SARS-CoV-2 by  FDA under an Emergency Use Authorization (EUA).  This EUA will remain in effect (meaning this test can be used) for the duration of the COVID-19 declaration under Section 564(b)(1) of the Act, 21 U.S.C. section 360bbb-3(b)(1), unless the authorization is terminated or revoked sooner. Performed at [facility], [address] [place], [place] [address]   C Difficile Quick Screen w PCR reflex     Status: Abnormal   Collection Time: 02/19/20  7:50 PM   Specimen: STOOL  Result Value Ref Range Status   C Diff antigen NEGATIVE NEGATIVE Final   C Diff toxin POSITIVE (A) NEGATIVE Final   C Diff interpretation Results are indeterminate. See PCR results.  Final  C. Diff by PCR, Reflexed     Status: Abnormal   Collection Time: 02/19/20  7:50 PM  Result Value Ref Range Status   Toxigenic C. Difficile by PCR POSITIVE (A) NEGATIVE Final    Comment: Positive for toxigenic C. difficile with little to no toxin production. Only treat if clinical presentation suggests symptomatic illness. Performed at [facility], [place] [address], [place], [place] [address]   MRSA PCR Screening     Status: None   Collection Time: 02/20/20  1:33 AM   Specimen: Nasal Mucosa; Nasopharyngeal  Result Value Ref Range Status   MRSA by PCR NEGATIVE NEGATIVE Final    Comment:        The GeneXpert MRSA Assay (FDA approved for NASAL specimens only), is one component of a comprehensive MRSA colonization surveillance program. It is not intended to diagnose MRSA infection nor to guide or monitor treatment for MRSA infections. Performed at [facility], [place] [address], [place],  [address]      Radiological Exams on Admission: CT Angio Chest PE W and/or Wo Contrast  Result Date: 02/19/2020 CLINICAL DATA:  Weakness. EXAM: CT ANGIOGRAPHY CHEST WITH CONTRAST TECHNIQUE: Multidetector CT imaging of the chest was performed using the standard protocol during bolus administration of  intravenous contrast. Multiplanar CT image reconstructions and MIPs were obtained to evaluate the vascular anatomy. CONTRAST:  158mL OMNIPAQUE IOHEXOL 350 MG/ML SOLN COMPARISON:  June 02, 2005 FINDINGS: Cardiovascular: A right-sided venous Port-A-Cath is in place. There is moderate severity calcification of the thoracic aorta. Satisfactory opacification of the pulmonary arteries to the segmental level. No evidence of pulmonary embolism. Normal heart size. No pericardial effusion. Marked severity coronary artery calcification is seen. Mediastinum/Nodes: No enlarged mediastinal, hilar, or axillary lymph nodes. Thyroid gland, trachea, and esophagus demonstrate no significant findings. Lungs/Pleura: Lungs are clear. No pleural effusion or pneumothorax. Upper Abdomen: A moderate amount of free fluid is seen. Marked severity diffuse colonic wall thickening is present. Musculoskeletal: No chest wall abnormality. No acute or significant osseous findings. Review of the MIP images confirms the above findings. IMPRESSION: 1. No evidence of pulmonary embolus or acute cardiopulmonary disease. 2. Marked severity diffuse colonic wall thickening, consistent with colitis. 3. Moderate amount of free fluid in the abdomen. 4. Marked severity coronary artery calcification. Aortic Atherosclerosis (ICD10-I70.0). Electronically Signed   By: Virgina Norfolk M.D.   On: 02/19/2020 16:57   CT ABDOMEN PELVIS W CONTRAST  Result Date: 02/19/2020 CLINICAL DATA:  Weakness. EXAM: CT ABDOMEN AND PELVIS WITH CONTRAST TECHNIQUE: Multidetector CT imaging of the abdomen and pelvis  was performed using the standard protocol following bolus administration of intravenous contrast. CONTRAST:  140mL OMNIPAQUE IOHEXOL 350 MG/ML SOLN COMPARISON:  None. FINDINGS: Lower chest: No acute abnormality. Hepatobiliary: No focal liver abnormality is seen. No gallstones, gallbladder wall thickening, or biliary dilatation. Pancreas: Unremarkable. No pancreatic  ductal dilatation or surrounding inflammatory changes. Spleen: Normal in size without focal abnormality. Adrenals/Urinary Tract: Adrenal glands are unremarkable. Kidneys are normal, without renal calculi, focal lesion, or hydronephrosis. The urinary bladder is absent. Stomach/Bowel: Stomach is within normal limits. Surgically anastomosed bowel is seen within the right hemipelvis. There is no evidence of bowel dilatation. Marked severity diffuse colonic wall thickening is seen. This involves all portions of the large bowel. A moderate amount of pericolonic inflammatory fat stranding is present. Vascular/Lymphatic: Moderate to marked severity calcification of the abdominal aorta is seen without evidence of aneurysmal dilatation or dissection. No enlarged abdominal or pelvic lymph nodes. Reproductive: The prostate gland is absent. Other: A right lower quadrant ostomy site is seen. A moderate amount of free fluid is seen within the abdomen. A mild amount of pelvic free fluid is also noted. No intra-abdominal free air is identified. Musculoskeletal: Multilevel degenerative changes are seen throughout the lumbar spine. IMPRESSION: 1. Marked severity diffuse colonic wall thickening, consistent with an infectious or inflammatory process. 2. Moderate amount of free fluid within the abdomen and pelvis. Aortic Atherosclerosis (ICD10-I70.0). Electronically Signed   By: Virgina Norfolk M.D.   On: 02/19/2020 17:02   DG Chest Port 1 View  Result Date: 02/19/2020 CLINICAL DATA:  Shortness of breath x2 days, recent C diff EXAM: PORTABLE CHEST 1 VIEW COMPARISON:  01/08/2020 FINDINGS: Lungs are clear.  No pleural effusion or pneumothorax. Heart is normal in size.  Thoracic aortic atherosclerosis. Right chest port terminates in the upper right atrium. IMPRESSION: No evidence of acute cardiopulmonary disease. Electronically Signed   By: Julian Hy M.D.   On: 02/19/2020 16:00    EKG: Independently reviewed.  Sinus  tachycardia.  Assessment/Plan Principal Problem:   Sepsis ([place]) Active Problems:   Essential hypertension   Colitis    1. Sepsis secondary to C. difficile colitis for which patient is on oral vancomycin continue with aggressive hydration for now.  Patient is responding to fluids.  Closely monitor in stepdown. 2. History of CAD status post stenting has some chest pain off and on.  Trend cardiac markers EKG does not show any acute except for sinus tachycardia.  CT angiogram of the chest was negative.  Continue aspirin and statin holding her beta-blockers due to hypotension. 3. Acute renal failure likely from diarrhea.  Continue hydration hold ARB and recheck metabolic panel after hydration. 4. History of bladder cancer status post chemotherapy and ileal conduit.  Presently under surveillance. 5. Hypertension holding antihypertensives due to septic picture with hypotension.  Given that patient is septic will need close monitoring for any further deterioration in inpatient status.   DVT prophylaxis: Lovenox. Code Status: Full code. Family Communication: Discussed with patient. Disposition Plan: Home. Consults called: None. Admission status: Inpatient.   Rise Patience MD [facility] Pager [phone].  If 7PM-7AM, please contact night-coverage [email or web address] Password [facility]  02/20/2020, 3:23 AM

## 2020-02-20 NOTE — TOC Initial Note (Signed)
Transition of Care [facility]) - Initial/Assessment Note    Patient Details  Name: Brandon Haynes MRN: [id number] Date of Birth: Feb 05, 1941  Transition of Care [facility]) CM/SW Contact:    Leeroy Cha, RN Phone Number: 02/20/2020, 8:30 AM  Clinical Narrative:                 Sepsis, c.diff toxin +,iv l.r. at 125cc/hrs, po vanco.  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 From home Has pcp Will return home. Expected Discharge Plan: Home/Self Care Barriers to Discharge: Continued Medical Work up   Patient Goals and CMS Choice Patient states their goals for this hospitalization and ongoing recovery are:: to go back home CMS Medicare.gov Compare Post Acute Care list provided to:: Patient    Expected Discharge Plan and Services Expected Discharge Plan: Home/Self Care   Discharge Planning Services: CM Consult   Living arrangements for the past 2 months: Single Family Home                                      Prior Living Arrangements/Services Living arrangements for the past 2 months: Single Family Home Lives with:: Spouse Patient language and need for interpreter reviewed:: No        Need for Family Participation in Patient Care: Yes (Comment) Care giver support system in place?: Yes (comment)   Criminal Activity/Legal Involvement Pertinent to Current Situation/Hospitalization: No - Comment as needed  Activities of Daily Living Home Assistive Devices/Equipment: Walker (specify type) ADL Screening (condition at time of admission) Patient's cognitive ability adequate to safely complete daily activities?: Yes Is the patient deaf or have difficulty hearing?: No Does the patient have difficulty seeing, even when wearing glasses/contacts?: Yes Does the patient have difficulty concentrating, remembering, or making decisions?: No Patient able to express need for assistance with ADLs?: Yes Does the patient have difficulty  dressing or bathing?: No Independently performs ADLs?: Yes (appropriate for developmental age) Does the patient have difficulty walking or climbing stairs?: No Weakness of Legs: None Weakness of Arms/Hands: None  Permission Sought/Granted                  Emotional Assessment Appearance:: Appears stated age Attitude/Demeanor/Rapport: Engaged Affect (typically observed): Calm Orientation: : Oriented to Self, Oriented to Place, Oriented to  Time, Oriented to Situation Alcohol / Substance Use: Not Applicable Psych Involvement: No (comment)  Admission diagnosis:  Colitis [K52.9] Sepsis without acute organ dysfunction, due to unspecified organism ([place]) [A41.9] Sepsis ([place]) [A41.9] Patient Active Problem List   Diagnosis Date Noted  . Sepsis ([place]) 02/20/2020  . Colitis 02/19/2020  . Bladder cancer ([place]) 12/13/2019  . Port-A-Cath in place 10/04/2019  . 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 Recurrent epistaxis 10/19/2017  . 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 Bilateral sensorineural hearing loss 06/30/2016  . Subjective tinnitus, bilateral 06/30/2016  . Prediabetes 04/16/2016  . Leg pain 03/06/2016  . Dyspnea on exertion 08/23/2015  . Dizziness 08/23/2015  . Rash 08/23/2015  . Coronary artery disease involving native coronary artery of native heart without angina pectoris   . Syncope 12/03/2014  . 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 Symptomatic hypotension 03/22/2014  . Goals of care, counseling/discussion 03/22/2014  . Encounter to establish care with new doctor 02/26/2014  . Rotator cuff tear 08/28/2013  . GERD 08/29/2010  .  HEARING LOSS 06/21/2008  . PERIPHERAL VASCULAR DISEASE 06/21/2008  . ALLERGIC RHINITIS 06/21/2008  . BASAL CELL CARCINOMA, HX OF 06/21/2008  . COLONIC POLYPS 03/29/2008  . HYPERCHOLESTEROLEMIA 03/29/2008  . GOUT 03/29/2008  . ANXIETY 03/29/2008  . Essential hypertension 03/29/2008  . DEGENERATIVE DISC DISEASE  03/29/2008  . BACK PAIN, LUMBAR 03/29/2008   PCP:  Tammi Sou, MD Pharmacy:   [id number] - [place], [place] - [address] [place] [address] Phone: [phone] Fax: [phone]     Social Determinants of Health (SDOH) Interventions    Readmission Risk Interventions No flowsheet data found.

## 2020-02-20 NOTE — Progress Notes (Signed)
BLANCH DYKHUIZEN is a 79 y.o. male with history of CAD, bladder cancer status post ileal conduit, history of c-diff diarrhea, hypertension has been experiencing recurrent watery diarrhea multiple episodes over the last 1 week.  He reports he had completed a 10 day course of po vancomycin for his first c-diff infection.   Was just starting to feel better for a few days  Then he started having diarrhea again.   Given the symptoms patient presented to the ER at [facility] for further evealuation.  Diarrhea is watery and he has diffuse abdominal discomfort, crampy in nature.   C-diff toxin and c-diff PCR positive.  Admitted for recurrent c-diff diarrhea.  Started on po vancomycin.  02/20/20: Seen and examined.  Persistent diarrhea.  Hypotensive with tachycardia.  On IV fluid.  Maintain hemodynamic stability MAP>65 and HR< 110, O2 sats>92%.  Daily CBC and BMP.  Prerenal AKI is resolved on IV fluid.  Leukocytosis is trending down.  Lactic acid normal.  Continue current management.  Please refer to H&P dictated by my partner Dr. Hal Hope on 02/20/20 for further details of the assessment and plan.

## 2020-02-20 NOTE — Progress Notes (Signed)
Notified Dr. Nevada Crane of BP 97/62 (67) and HR of 118 and confirmed about new lopressor order. MD requested to hold for this dose to keep MAP >65

## 2020-02-20 NOTE — Telephone Encounter (Signed)
Signed and put in box to go up front. Signed:  Crissie Sickles, MD           02/20/2020

## 2020-02-20 NOTE — Telephone Encounter (Signed)
Received SN order from [facility] and med supply order from [place]. Placed on PCP desk to review and sign, if appropriate.

## 2020-02-20 NOTE — Progress Notes (Signed)
   02/20/20 0300 02/20/20 0304  Vitals  BP (!) 86/57 (!) 86/53  MAP (mmHg) 66 (!) 58  Pulse Rate (!) 114 (!) 115  ECG Heart Rate (!) 115 (!) 115  Resp 18 18   Provider made aware of hypotension

## 2020-02-20 NOTE — Progress Notes (Signed)
Admitting provider made aware that patient requesting home medication, tramadol.

## 2020-02-21 DIAGNOSIS — I10 Essential (primary) hypertension: Secondary | ICD-10-CM

## 2020-02-21 DIAGNOSIS — K529 Noninfective gastroenteritis and colitis, unspecified: Secondary | ICD-10-CM

## 2020-02-21 DIAGNOSIS — A419 Sepsis, unspecified organism: Secondary | ICD-10-CM

## 2020-02-21 LAB — CBC WITH DIFFERENTIAL/PLATELET
Abs Immature Granulocytes: 0.25 10*3/uL — ABNORMAL HIGH (ref 0.00–0.07)
Basophils Absolute: 0.1 10*3/uL (ref 0.0–0.1)
Basophils Relative: 0 %
Eosinophils Absolute: 0.1 10*3/uL (ref 0.0–0.5)
Eosinophils Relative: 0 %
HCT: 38.7 % — ABNORMAL LOW (ref 39.0–52.0)
Hemoglobin: 12.2 g/dL — ABNORMAL LOW (ref 13.0–17.0)
Immature Granulocytes: 1 %
Lymphocytes Relative: 13 %
Lymphs Abs: 3.5 10*3/uL (ref 0.7–4.0)
MCH: 28.9 pg (ref 26.0–34.0)
MCHC: 31.5 g/dL (ref 30.0–36.0)
MCV: 91.7 fL (ref 80.0–100.0)
Monocytes Absolute: 1.9 10*3/uL — ABNORMAL HIGH (ref 0.1–1.0)
Monocytes Relative: 7 %
Neutro Abs: 20.7 10*3/uL — ABNORMAL HIGH (ref 1.7–7.7)
Neutrophils Relative %: 79 %
Platelets: 221 10*3/uL (ref 150–400)
RBC: 4.22 MIL/uL (ref 4.22–5.81)
RDW: 16 % — ABNORMAL HIGH (ref 11.5–15.5)
WBC: 26.5 10*3/uL — ABNORMAL HIGH (ref 4.0–10.5)
nRBC: 0 % (ref 0.0–0.2)

## 2020-02-21 LAB — COMPREHENSIVE METABOLIC PANEL
ALT: 8 U/L (ref 0–44)
AST: 11 U/L — ABNORMAL LOW (ref 15–41)
Albumin: 2 g/dL — ABNORMAL LOW (ref 3.5–5.0)
Alkaline Phosphatase: 50 U/L (ref 38–126)
Anion gap: 7 (ref 5–15)
BUN: 22 mg/dL (ref 8–23)
CO2: 22 mmol/L (ref 22–32)
Calcium: 7.8 mg/dL — ABNORMAL LOW (ref 8.9–10.3)
Chloride: 104 mmol/L (ref 98–111)
Creatinine, Ser: 1.08 mg/dL (ref 0.61–1.24)
GFR calc Af Amer: >60 mL/min (ref >60)
GFR calc non Af Amer: >60 mL/min (ref >60)
Glucose, Bld: 114 mg/dL — ABNORMAL HIGH (ref 70–99)
Potassium: 4.1 mmol/L (ref 3.5–5.1)
Sodium: 133 mmol/L — ABNORMAL LOW (ref 135–145)
Total Bilirubin: 0.5 mg/dL (ref 0.3–1.2)
Total Protein: 4.4 g/dL — ABNORMAL LOW (ref 6.5–8.1)

## 2020-02-21 LAB — PHOSPHORUS: Phosphorus: 4 mg/dL (ref 2.5–4.6)

## 2020-02-21 LAB — MAGNESIUM: Magnesium: 1.9 mg/dL (ref 1.7–2.4)

## 2020-02-21 MED ORDER — LACTATED RINGERS IV SOLN: INTRAVENOUS | Status: DC

## 2020-02-21 NOTE — Progress Notes (Signed)
   02/21/20 1809  Assess: MEWS Score  Temp 98.5 F (36.9 C)  BP 114/73  Pulse Rate (!) 114  Resp 15  SpO2 98 %  Assess: MEWS Score  MEWS Temp 0  MEWS Systolic 0  MEWS Pulse 2  MEWS RR 0  MEWS LOC 0  MEWS Score 2  MEWS Score Color Yellow  Patient arrived to unit with a yellow mews. Will continue to monitor. No changes in plan of care.

## 2020-02-21 NOTE — Progress Notes (Signed)
PROGRESS NOTE  HY SCHUFF [id number] DOB: 1940/10/14 DOA: 02/19/2020 PCP: Tammi Sou, MD  HPI/Recap of past 24 hours: HPI from Dr Neldon Labella Brandon Haynes is a 79 y.o. male with history of CAD, bladder cancer status post ileal conduit, hypertension has been experiencing watery diarrhea multiple episodes over the last 1 week. Given the symptoms patient presented to the ER at [facility]. Reported associated crampy diffuse abdominal discomfort. Patient was recently treated for C. difficile colitis in April for which patient took 10 days of vancomycin oral.  Denies taking any other antibiotics.  Denies any sick contacts or recent travel. In the ER patient was hypotensive was given fluid bolus following which patient responded.  CT angiogram of the chest and CT abdomen and pelvis was done, negative for pulmonary embolism.  CT abdomen pelvis shows diffuse colitis. Labs show WBC count of 29.6, Creatinine increased from baseline of around normal to 1.5. Urine shows WBC and large leukocyte esterase.  C. difficile came back positive.  Patient started on oral vancomycin admitted for sepsis secondary to C. difficile colitis.    Today, patient reported feeling better, still reports generalized abdominal cramps, denies any nausea/vomiting.  Still with ongoing diarrhea but reports it being slightly formed and less in frequency.  Patient denies any chest pain, shortness of breath, fever/chills.   Assessment/Plan: Principal Problem:   Sepsis ([place]) Active Problems:   Essential hypertension   Colitis   Sepsis likely 2/2 C. difficile colitis Tachycardic, hypotensive with leukocytosis on admission Currently afebrile, still with leukocytosis, still tachycardic LA WNL C. difficile stool came back positive CT abdomen pelvis shows diffuse colitis Continue IV fluids, p.o. vancomycin  AKI Resolved Likely from above  Hypertension BP improving, although still intermittently  soft Continue IV fluids  History of CAD  Currently chest pain-free Continue aspirin, statin, metoprolol  History of bladder cancer status post chemotherapy and ileal conduit Outpatient oncology        Malnutrition Type:      Malnutrition Characteristics:      Nutrition Interventions:       Estimated body mass index is 26.32 kg/m as calculated from the following:   Height as of this encounter: 6\' 2"  (1.88 m).   Weight as of this encounter: 93 kg.     Code Status: Full  Family Communication: Discussed with patient  Disposition Plan: Status is: Inpatient  Remains inpatient appropriate because:Inpatient level of care appropriate due to severity of illness   Dispo: The patient is from: Home              Anticipated d/c is to: Home              Anticipated d/c date is: 2 days              Patient currently is not medically stable to d/c.  Still with diarrhea, requiring IV fluids   Consultants:  None  Procedures:  None  Antimicrobials:  P.o. vancomycin  DVT prophylaxis: Lovenox   Objective: Vitals:   02/21/20 0503 02/21/20 0800 02/21/20 0849 02/21/20 1000  BP: 92/61 111/70  92/75  Pulse: (!) 101 (!) 107  (!) 115  Resp: 20 19  16   Temp:   98 F (36.7 C)   TempSrc:   Oral   SpO2: 94% 94%  93%  Weight:      Height:        Intake/Output Summary (Last 24 hours) at 02/21/2020 1201 Last data filed at  02/21/2020 0800 Gross per 24 hour  Intake 4049.8 ml  Output 800 ml  Net 3249.8 ml   Filed Weights   02/19/20 1523  Weight: 93 kg    Exam:  General: NAD   Cardiovascular: S1, S2 present  Respiratory: CTAB  Abdomen: Soft, generalized tenderness, nondistended, bowel sounds present  Musculoskeletal: No bilateral pedal edema noted  Skin: Normal  Psychiatry: Normal mood    Data Reviewed: CBC: Recent Labs  Lab 02/19/20 1547 02/20/20 0431 02/21/20 0500  WBC 29.6* 25.2* 26.5*  NEUTROABS 23.9* 19.8* 20.7*  HGB 13.8 12.9* 12.2*   HCT 42.4 40.1 38.7*  MCV 89.3 90.5 91.7  PLT 253 223 [id number]   Basic Metabolic Panel: Recent Labs  Lab 02/19/20 1547 02/20/20 0431 02/21/20 0500  NA 132* 134* 133*  K 4.1 4.3 4.1  CL 101 106 104  CO2 19* 20* 22  GLUCOSE 180* 120* 114*  BUN 22 19 22   CREATININE 1.52* 1.08 1.08  CALCIUM 8.3* 8.0* 7.8*  MG  --   --  1.9  PHOS  --   --  4.0   GFR: Estimated Creatinine Clearance: 66.6 mL/min (by C-G formula based on SCr of 1.08 mg/dL). Liver Function Tests: Recent Labs  Lab 02/19/20 1547 02/20/20 0431 02/21/20 0500  AST 12* 11* 11*  ALT 11 8 8   ALKPHOS 52 47 50  BILITOT 0.8 0.7 0.5  PROT 5.6* 4.7* 4.4*  ALBUMIN 2.7* 2.3* 2.0*   No results for input(s): LIPASE, AMYLASE in the last 168 hours. No results for input(s): AMMONIA in the last 168 hours. Coagulation Profile: Recent Labs  Lab 02/19/20 1547 02/20/20 0431  INR 1.1 1.2   Cardiac Enzymes: No results for input(s): CKTOTAL, CKMB, CKMBINDEX, TROPONINI in the last 168 hours. BNP (last 3 results) No results for input(s): PROBNP in the last 8760 hours. HbA1C: No results for input(s): HGBA1C in the last 72 hours. CBG: Recent Labs  Lab 02/19/20 1523  GLUCAP 183*   Lipid Profile: No results for input(s): CHOL, HDL, LDLCALC, TRIG, CHOLHDL, LDLDIRECT in the last 72 hours. Thyroid Function Tests: No results for input(s): TSH, T4TOTAL, FREET4, T3FREE, THYROIDAB in the last 72 hours. Anemia Panel: No results for input(s): VITAMINB12, FOLATE, FERRITIN, TIBC, IRON, RETICCTPCT in the last 72 hours. Urine analysis:    Component Value Date/Time   COLORURINE YELLOW 02/19/2020 1548   APPEARANCEUR CLOUDY (A) 02/19/2020 1548   LABSPEC 1.015 02/19/2020 1548   PHURINE 6.5 02/19/2020 1548   GLUCOSEU NEGATIVE 02/19/2020 1548   HGBUR TRACE (A) 02/19/2020 1548   BILIRUBINUR NEGATIVE 02/19/2020 1548   BILIRUBINUR negative 05/04/2019 [place] 02/19/2020 1548   PROTEINUR 30 (A) 02/19/2020 1548   UROBILINOGEN  0.2 05/04/2019 1554   NITRITE NEGATIVE 02/19/2020 1548   LEUKOCYTESUR LARGE (A) 02/19/2020 1548   Sepsis Labs: @LABRCNTIP (procalcitonin:4,lacticidven:4)  ) Recent Results (from the past 240 hour(s))  Blood Culture (routine x 2)     Status: None (Preliminary result)   Collection Time: 02/19/20  3:45 PM   Specimen: BLOOD  Result Value Ref Range Status   Specimen Description BLOOD CHEST PORTA CATH  Final   Special Requests   Final    BOTTLES DRAWN AEROBIC AND ANAEROBIC Blood Culture adequate volume   Culture   Final    NO GROWTH < 24 HOURS Performed at [facility], [place] [address] [place],  [address]    Report Status PENDING  Incomplete  SARS Coronavirus 2 by RT PCR (hospital  order, performed in [facility] hospital lab) Nasopharyngeal Nasopharyngeal Swab     Status: None   Collection Time: 02/19/20  3:47 PM   Specimen: Nasopharyngeal Swab  Result Value Ref Range Status   SARS Coronavirus 2 NEGATIVE NEGATIVE Final    Comment: (NOTE) SARS-CoV-2 target nucleic acids are NOT DETECTED. The SARS-CoV-2 RNA is generally detectable in upper and lower respiratory specimens during the acute phase of infection. The lowest concentration of SARS-CoV-2 viral copies this assay can detect is 250 copies / mL. A negative result does not preclude SARS-CoV-2 infection and should not be used as the sole basis for treatment or other patient management decisions.  A negative result may occur with improper specimen collection / handling, submission of specimen other than nasopharyngeal swab, presence of viral mutation(s) within the areas targeted by this assay, and inadequate number of viral copies (<250 copies / mL). A negative result must be combined with clinical observations, patient history, and epidemiological information. Fact Sheet for Patients:   StrictlyIdeas.no Fact Sheet for Healthcare Providers: BankingDealers.co.za This test is  not yet approved or cleared  by the [place] FDA and has been authorized for detection and/or diagnosis of SARS-CoV-2 by FDA under an Emergency Use Authorization (EUA).  This EUA will remain in effect (meaning this test can be used) for the duration of the COVID-19 declaration under Section 564(b)(1) of the Act, 21 U.S.C. section 360bbb-3(b)(1), unless the authorization is terminated or revoked sooner. Performed at [facility], [address] [place], [place] [address]   Urine culture     Status: Abnormal   Collection Time: 02/19/20  3:48 PM   Specimen: In/Out Cath Urine  Result Value Ref Range Status   Specimen Description   Final    IN/OUT CATH URINE Performed at [facility], [address] [place], [place] [address]    Special Requests   Final    NONE Performed at [facility], [address] [place], [place] [address]    Culture MULTIPLE SPECIES PRESENT, SUGGEST RECOLLECTION (A)  Final   Report Status 02/20/2020 FINAL  Final  Gastrointestinal Panel by PCR , Stool     Status: None   Collection Time: 02/19/20  6:50 PM   Specimen: STOOL  Result Value Ref Range Status   Campylobacter species NOT DETECTED NOT DETECTED Final   Plesimonas shigelloides NOT DETECTED NOT DETECTED Final   Salmonella species NOT DETECTED NOT DETECTED Final   Yersinia enterocolitica NOT DETECTED NOT DETECTED Final   Vibrio species NOT DETECTED NOT DETECTED Final   Vibrio cholerae NOT DETECTED NOT DETECTED Final   Enteroaggregative E coli (EAEC) NOT DETECTED NOT DETECTED Final   Enteropathogenic E coli (EPEC) NOT DETECTED NOT DETECTED Final   Enterotoxigenic E coli (ETEC) NOT DETECTED NOT DETECTED Final   Shiga like toxin producing E coli (STEC) NOT DETECTED NOT DETECTED Final   Shigella/Enteroinvasive E coli (EIEC) NOT DETECTED NOT DETECTED Final   Cryptosporidium NOT DETECTED NOT DETECTED Final   Cyclospora cayetanensis NOT DETECTED NOT DETECTED Final    Entamoeba histolytica NOT DETECTED NOT DETECTED Final   Giardia lamblia NOT DETECTED NOT DETECTED Final   Adenovirus F40/41 NOT DETECTED NOT DETECTED Final   Astrovirus NOT DETECTED NOT DETECTED Final   Norovirus GI/GII NOT DETECTED NOT DETECTED Final   Rotavirus A NOT DETECTED NOT DETECTED Final   Sapovirus (I, II, IV, and V) NOT DETECTED NOT DETECTED Final    Comment: Performed  at [facility], [place], [place] [address]  C Difficile Quick Screen w PCR reflex     Status: Abnormal   Collection Time: 02/19/20  7:50 PM   Specimen: STOOL  Result Value Ref Range Status   C Diff antigen NEGATIVE NEGATIVE Final   C Diff toxin POSITIVE (A) NEGATIVE Final   C Diff interpretation Results are indeterminate. See PCR results.  Final  C. Diff by PCR, Reflexed     Status: Abnormal   Collection Time: 02/19/20  7:50 PM  Result Value Ref Range Status   Toxigenic C. Difficile by PCR POSITIVE (A) NEGATIVE Final    Comment: Positive for toxigenic C. difficile with little to no toxin production. Only treat if clinical presentation suggests symptomatic illness. Performed at [facility], [place] [address] [place], [place] [address]   MRSA PCR Screening     Status: None   Collection Time: 02/20/20  1:33 AM   Specimen: Nasal Mucosa; Nasopharyngeal  Result Value Ref Range Status   MRSA by PCR NEGATIVE NEGATIVE Final    Comment:        The GeneXpert MRSA Assay (FDA approved for NASAL specimens only), is one component of a comprehensive MRSA colonization surveillance program. It is not intended to diagnose MRSA infection nor to guide or monitor treatment for MRSA infections. Performed at [facility], [place] [address] [place], [place] [address]       Studies: No results found.  Scheduled Meds: . allopurinol  300 mg Oral Daily  . aspirin EC  81 mg Oral Daily  . Chlorhexidine Gluconate Cloth  6 each Topical Daily  . enoxaparin (LOVENOX) injection   40 mg Subcutaneous Q24H  . fluticasone  1 spray Each Nare Daily  . gabapentin  600 mg Oral q morning - 10a   And  . gabapentin  300 mg Oral Q1500  . metoprolol tartrate  12.5 mg Oral BID  . rosuvastatin  20 mg Oral QPM  . sodium chloride flush  10-40 mL Intracatheter Q12H  . vancomycin  125 mg Oral QID   Followed by  . [START ON 03/04/2020] vancomycin  125 mg Oral BID   Followed by  . [START ON 03/12/2020] vancomycin  125 mg Oral Daily   Followed by  . [START ON 03/19/2020] vancomycin  125 mg Oral QODAY   Followed by  . [START ON 03/27/2020] vancomycin  125 mg Oral Q3 days    Continuous Infusions: . lactated ringers 100 mL/hr at 02/21/20 1000     LOS: 2 days     Alma Friendly, MD [facility]  If 7PM-7AM, please contact night-coverage [email or web address] 02/21/2020, 12:01 PM

## 2020-02-22 LAB — COMPREHENSIVE METABOLIC PANEL
ALT: 13 U/L (ref 0–44)
AST: 24 U/L (ref 15–41)
Albumin: 2.1 g/dL — ABNORMAL LOW (ref 3.5–5.0)
Alkaline Phosphatase: 61 U/L (ref 38–126)
Anion gap: 11 (ref 5–15)
BUN: 22 mg/dL (ref 8–23)
CO2: 20 mmol/L — ABNORMAL LOW (ref 22–32)
Calcium: 7.7 mg/dL — ABNORMAL LOW (ref 8.9–10.3)
Chloride: 102 mmol/L (ref 98–111)
Creatinine, Ser: 1.02 mg/dL (ref 0.61–1.24)
GFR calc Af Amer: >60 mL/min (ref >60)
GFR calc non Af Amer: >60 mL/min (ref >60)
Glucose, Bld: 111 mg/dL — ABNORMAL HIGH (ref 70–99)
Potassium: 3.9 mmol/L (ref 3.5–5.1)
Sodium: 133 mmol/L — ABNORMAL LOW (ref 135–145)
Total Bilirubin: 0.3 mg/dL (ref 0.3–1.2)
Total Protein: 4.7 g/dL — ABNORMAL LOW (ref 6.5–8.1)

## 2020-02-22 LAB — CBC WITH DIFFERENTIAL/PLATELET
Abs Immature Granulocytes: 0.19 10*3/uL — ABNORMAL HIGH (ref 0.00–0.07)
Basophils Absolute: 0.1 10*3/uL (ref 0.0–0.1)
Basophils Relative: 0 %
Eosinophils Absolute: 0.3 10*3/uL (ref 0.0–0.5)
Eosinophils Relative: 1 %
HCT: 40 % (ref 39.0–52.0)
Hemoglobin: 12.4 g/dL — ABNORMAL LOW (ref 13.0–17.0)
Immature Granulocytes: 1 %
Lymphocytes Relative: 20 %
Lymphs Abs: 5.4 10*3/uL — ABNORMAL HIGH (ref 0.7–4.0)
MCH: 28.3 pg (ref 26.0–34.0)
MCHC: 31 g/dL (ref 30.0–36.0)
MCV: 91.3 fL (ref 80.0–100.0)
Monocytes Absolute: 1.5 10*3/uL — ABNORMAL HIGH (ref 0.1–1.0)
Monocytes Relative: 6 %
Neutro Abs: 19 10*3/uL — ABNORMAL HIGH (ref 1.7–7.7)
Neutrophils Relative %: 72 %
Platelets: 266 10*3/uL (ref 150–400)
RBC: 4.38 MIL/uL (ref 4.22–5.81)
RDW: 15.9 % — ABNORMAL HIGH (ref 11.5–15.5)
WBC: 26.4 10*3/uL — ABNORMAL HIGH (ref 4.0–10.5)
nRBC: 0 % (ref 0.0–0.2)

## 2020-02-22 LAB — PROCALCITONIN: Procalcitonin: 0.18 ng/mL

## 2020-02-22 MED ORDER — ACETAMINOPHEN 325 MG PO TABS
650.0000 mg | ORAL_TABLET | Freq: Four times a day (QID) | ORAL | Status: DC | PRN
  Administered 2020-02-22 – 2020-02-23 (×4): 650 mg via ORAL
  Filled 2020-02-22 (×4): qty 2

## 2020-02-22 MED ORDER — TRAMADOL HCL 50 MG PO TABS
50.0000 mg | ORAL_TABLET | Freq: Three times a day (TID) | ORAL | Status: DC | PRN
  Administered 2020-02-22 – 2020-02-24 (×4): 50 mg via ORAL
  Filled 2020-02-22 (×5): qty 1

## 2020-02-22 NOTE — Care Management Important Message (Signed)
Important Message  Patient Details IM Letter given to Gabriel Earing RN Case Manager to present to the Patient Name: Brandon Haynes MRN: [id number] Date of Birth: 11-18-1940   Medicare Important Message Given:  Yes     Kerin Salen 02/22/2020, 11:11 AM

## 2020-02-22 NOTE — Progress Notes (Signed)
PROGRESS NOTE  Brandon Haynes [id number] DOB: 10/11/1940 DOA: 02/19/2020 PCP: Brandon Sou, MD  HPI/Recap of past 24 hours: HPI from Dr Brandon Haynes Brandon Haynes is a 79 y.o. male with history of CAD, bladder cancer status post ileal conduit, hypertension has been experiencing watery diarrhea multiple episodes over the last 1 week. Given the symptoms patient presented to the ER at [facility]. Reported associated crampy diffuse abdominal discomfort. Patient was recently treated for C. difficile colitis in April for which patient took 10 days of vancomycin oral.  Denies taking any other antibiotics.  Denies any sick contacts or recent travel. In the ER patient was hypotensive was given fluid bolus following which patient responded.  CT angiogram of the chest and CT abdomen and pelvis was done, negative for pulmonary embolism.  CT abdomen pelvis shows diffuse colitis. Labs show WBC count of 29.6, Creatinine increased from baseline of around normal to 1.5. Urine shows WBC and large leukocyte esterase.  C. difficile came back positive.  Patient started on oral vancomycin admitted for sepsis secondary to C. difficile colitis.    Today, patient reported 3 episodes of loose stools, reports feeling bloated, with some mild abdominal discomfort.  Denies any chest pain, shortness of breath, fever/chills, nausea/vomiting.   Assessment/Plan: Principal Problem:   Sepsis ([place]) Active Problems:   Essential hypertension   Colitis   Sepsis likely 2/2 C. difficile colitis Tachycardic, hypotensive with leukocytosis on admission Currently afebrile, still with leukocytosis LA WNL C. difficile stool came back positive CT abdomen pelvis shows diffuse colitis Procalcitonin 0.18 Repeat urine culture pending BC x2 pending Continue IV fluids, p.o. vancomycin  AKI Resolved Likely from above  Hypertension BP improving, although still intermittently soft Continue IV fluids  History of CAD   Currently chest pain-free Continue aspirin, statin, metoprolol  History of bladder cancer status post chemotherapy and ileal conduit Outpatient oncology        Malnutrition Type:      Malnutrition Characteristics:      Nutrition Interventions:       Estimated body mass index is 26.32 kg/m as calculated from the following:   Height as of this encounter: 6\' 2"  (1.88 m).   Weight as of this encounter: 93 kg.     Code Status: Full  Family Communication: Discussed with patient  Disposition Plan: Status is: Inpatient  Remains inpatient appropriate because:Inpatient level of care appropriate due to severity of illness   Dispo: The patient is from: Home              Anticipated d/c is to: Home              Anticipated d/c date is: 2 days              Patient currently is not medically stable to d/c.  Still with diarrhea, requiring IV fluids   Consultants:  None  Procedures:  None  Antimicrobials:  P.o. vancomycin  DVT prophylaxis: Lovenox   Objective: Vitals:   02/22/20 0128 02/22/20 0418 02/22/20 0835 02/22/20 1325  BP: 97/66 92/60 106/70 100/66  Pulse: 88 92 94 88  Resp: 18 16 17 18   Temp: 98.7 F (37.1 C) 98.3 F (36.8 C) 98 F (36.7 C) 97.8 F (36.6 C)  TempSrc: Oral Oral Oral Oral  SpO2: 93% 96%  96%  Weight:      Height:        Intake/Output Summary (Last 24 hours) at 02/22/2020 [id number] Last data filed  at 02/22/2020 1412 Gross per 24 hour  Intake 2314.88 ml  Output 1325 ml  Net 989.88 ml   Filed Weights   02/19/20 1523  Weight: 93 kg    Exam:  General: NAD   Cardiovascular: S1, S2 present  Respiratory: CTAB  Abdomen: Soft, generalized tenderness, nondistended, bowel sounds present  Musculoskeletal: No bilateral pedal edema noted  Skin: Normal  Psychiatry: Normal mood    Data Reviewed: CBC: Recent Labs  Lab 02/19/20 1547 02/20/20 0431 02/21/20 0500 02/22/20 0322  WBC 29.6* 25.2* 26.5* 26.4*  NEUTROABS  23.9* 19.8* 20.7* 19.0*  HGB 13.8 12.9* 12.2* 12.4*  HCT 42.4 40.1 38.7* 40.0  MCV 89.3 90.5 91.7 91.3  PLT 253 223 221 [id number]   Basic Metabolic Panel: Recent Labs  Lab 02/19/20 1547 02/20/20 0431 02/21/20 0500 02/22/20 0322  NA 132* 134* 133* 133*  K 4.1 4.3 4.1 3.9  CL 101 106 104 102  CO2 19* 20* 22 20*  GLUCOSE 180* 120* 114* 111*  BUN 22 19 22 22   CREATININE 1.52* 1.08 1.08 1.02  CALCIUM 8.3* 8.0* 7.8* 7.7*  MG  --   --  1.9  --   PHOS  --   --  4.0  --    GFR: Estimated Creatinine Clearance: 70.5 mL/min (by C-G formula based on SCr of 1.02 mg/dL). Liver Function Tests: Recent Labs  Lab 02/19/20 1547 02/20/20 0431 02/21/20 0500 02/22/20 0322  AST 12* 11* 11* 24  ALT 11 8 8 13   ALKPHOS 52 47 50 61  BILITOT 0.8 0.7 0.5 0.3  PROT 5.6* 4.7* 4.4* 4.7*  ALBUMIN 2.7* 2.3* 2.0* 2.1*   No results for input(s): LIPASE, AMYLASE in the last 168 hours. No results for input(s): AMMONIA in the last 168 hours. Coagulation Profile: Recent Labs  Lab 02/19/20 1547 02/20/20 0431  INR 1.1 1.2   Cardiac Enzymes: No results for input(s): CKTOTAL, CKMB, CKMBINDEX, TROPONINI in the last 168 hours. BNP (last 3 results) No results for input(s): PROBNP in the last 8760 hours. HbA1C: No results for input(s): HGBA1C in the last 72 hours. CBG: Recent Labs  Lab 02/19/20 1523  GLUCAP 183*   Lipid Profile: No results for input(s): CHOL, HDL, LDLCALC, TRIG, CHOLHDL, LDLDIRECT in the last 72 hours. Thyroid Function Tests: No results for input(s): TSH, T4TOTAL, FREET4, T3FREE, THYROIDAB in the last 72 hours. Anemia Panel: No results for input(s): VITAMINB12, FOLATE, FERRITIN, TIBC, IRON, RETICCTPCT in the last 72 hours. Urine analysis:    Component Value Date/Time   COLORURINE YELLOW 02/19/2020 1548   APPEARANCEUR CLOUDY (A) 02/19/2020 1548   LABSPEC 1.015 02/19/2020 1548   PHURINE 6.5 02/19/2020 1548   GLUCOSEU NEGATIVE 02/19/2020 1548   HGBUR TRACE (A) 02/19/2020 1548    BILIRUBINUR NEGATIVE 02/19/2020 1548   BILIRUBINUR negative 05/04/2019 [place] 02/19/2020 1548   PROTEINUR 30 (A) 02/19/2020 1548   UROBILINOGEN 0.2 05/04/2019 1554   NITRITE NEGATIVE 02/19/2020 1548   LEUKOCYTESUR LARGE (A) 02/19/2020 1548   Sepsis Labs: @LABRCNTIP (procalcitonin:4,lacticidven:4)  ) Recent Results (from the past 240 hour(s))  Blood Culture (routine x 2)     Status: None (Preliminary result)   Collection Time: 02/19/20  3:45 PM   Specimen: BLOOD  Result Value Ref Range Status   Specimen Description BLOOD CHEST PORTA CATH  Final   Special Requests   Final    BOTTLES DRAWN AEROBIC AND ANAEROBIC Blood Culture adequate volume   Culture   Final    NO  GROWTH 3 DAYS Performed at [facility], [place] [address] [place], [place] [address]    Report Status PENDING  Incomplete  SARS Coronavirus 2 by RT PCR (hospital order, performed in [facility] hospital lab) Nasopharyngeal Nasopharyngeal Swab     Status: None   Collection Time: 02/19/20  3:47 PM   Specimen: Nasopharyngeal Swab  Result Value Ref Range Status   SARS Coronavirus 2 NEGATIVE NEGATIVE Final    Comment: (NOTE) SARS-CoV-2 target nucleic acids are NOT DETECTED. The SARS-CoV-2 RNA is generally detectable in upper and lower respiratory specimens during the acute phase of infection. The lowest concentration of SARS-CoV-2 viral copies this assay can detect is 250 copies / mL. A negative result does not preclude SARS-CoV-2 infection and should not be used as the sole basis for treatment or other patient management decisions.  A negative result may occur with improper specimen collection / handling, submission of specimen other than nasopharyngeal swab, presence of viral mutation(s) within the areas targeted by this assay, and inadequate number of viral copies (<250 copies / mL). A negative result must be combined with clinical observations, patient history, and epidemiological  information. Fact Sheet for Patients:   StrictlyIdeas.no Fact Sheet for Healthcare Providers: BankingDealers.co.za This test is not yet approved or cleared  by the [place] FDA and has been authorized for detection and/or diagnosis of SARS-CoV-2 by FDA under an Emergency Use Authorization (EUA).  This EUA will remain in effect (meaning this test can be used) for the duration of the COVID-19 declaration under Section 564(b)(1) of the Act, 21 U.S.C. section 360bbb-3(b)(1), unless the authorization is terminated or revoked sooner. Performed at [facility], [address] [place], [place] [address]   Urine culture     Status: Abnormal   Collection Time: 02/19/20  3:48 PM   Specimen: In/Out Cath Urine  Result Value Ref Range Status   Specimen Description   Final    IN/OUT CATH URINE Performed at [facility], [address] [place], [place] [address]    Special Requests   Final    NONE Performed at [facility], [address] [place], [place] [address]    Culture MULTIPLE SPECIES PRESENT, SUGGEST RECOLLECTION (A)  Final   Report Status 02/20/2020 FINAL  Final  Gastrointestinal Panel by PCR , Stool     Status: None   Collection Time: 02/19/20  6:50 PM   Specimen: STOOL  Result Value Ref Range Status   Campylobacter species NOT DETECTED NOT DETECTED Final   Plesimonas shigelloides NOT DETECTED NOT DETECTED Final   Salmonella species NOT DETECTED NOT DETECTED Final   Yersinia enterocolitica NOT DETECTED NOT DETECTED Final   Vibrio species NOT DETECTED NOT DETECTED Final   Vibrio cholerae NOT DETECTED NOT DETECTED Final   Enteroaggregative E coli (EAEC) NOT DETECTED NOT DETECTED Final   Enteropathogenic E coli (EPEC) NOT DETECTED NOT DETECTED Final   Enterotoxigenic E coli (ETEC) NOT DETECTED NOT DETECTED Final   Shiga like toxin producing E coli (STEC) NOT DETECTED NOT DETECTED Final    Shigella/Enteroinvasive E coli (EIEC) NOT DETECTED NOT DETECTED Final   Cryptosporidium NOT DETECTED NOT DETECTED Final   Cyclospora cayetanensis NOT DETECTED NOT DETECTED Final   Entamoeba histolytica NOT DETECTED NOT DETECTED Final   Giardia lamblia NOT DETECTED NOT DETECTED Final   Adenovirus F40/41 NOT DETECTED NOT DETECTED Final   Astrovirus NOT DETECTED NOT DETECTED Final   Norovirus GI/GII  NOT DETECTED NOT DETECTED Final   Rotavirus A NOT DETECTED NOT DETECTED Final   Sapovirus (I, II, IV, and V) NOT DETECTED NOT DETECTED Final    Comment: Performed at [facility], [place], [place] [address]  C Difficile Quick Screen w PCR reflex     Status: Abnormal   Collection Time: 02/19/20  7:50 PM   Specimen: STOOL  Result Value Ref Range Status   C Diff antigen NEGATIVE NEGATIVE Final   C Diff toxin POSITIVE (A) NEGATIVE Final   C Diff interpretation Results are indeterminate. See PCR results.  Final  C. Diff by PCR, Reflexed     Status: Abnormal   Collection Time: 02/19/20  7:50 PM  Result Value Ref Range Status   Toxigenic C. Difficile by PCR POSITIVE (A) NEGATIVE Final    Comment: Positive for toxigenic C. difficile with little to no toxin production. Only treat if clinical presentation suggests symptomatic illness. Performed at [facility], [place] [address], [place], [place] [address]   MRSA PCR Screening     Status: None   Collection Time: 02/20/20  1:33 AM   Specimen: Nasal Mucosa; Nasopharyngeal  Result Value Ref Range Status   MRSA by PCR NEGATIVE NEGATIVE Final    Comment:        The GeneXpert MRSA Assay (FDA approved for NASAL specimens only), is one component of a comprehensive MRSA colonization surveillance program. It is not intended to diagnose MRSA infection nor to guide or monitor treatment for MRSA infections. Performed at [facility], [place] [address], [place], [place] [address]       Studies: No results  found.  Scheduled Meds: . allopurinol  300 mg Oral Daily  . aspirin EC  81 mg Oral Daily  . Chlorhexidine Gluconate Cloth  6 each Topical Daily  . enoxaparin (LOVENOX) injection  40 mg Subcutaneous Q24H  . fluticasone  1 spray Each Nare Daily  . gabapentin  600 mg Oral q morning - 10a   And  . gabapentin  300 mg Oral Q1500  . metoprolol tartrate  12.5 mg Oral BID  . rosuvastatin  20 mg Oral QPM  . sodium chloride flush  10-40 mL Intracatheter Q12H  . vancomycin  125 mg Oral QID   Followed by  . [START ON 03/04/2020] vancomycin  125 mg Oral BID   Followed by  . [START ON 03/12/2020] vancomycin  125 mg Oral Daily   Followed by  . [START ON 03/19/2020] vancomycin  125 mg Oral QODAY   Followed by  . [START ON 03/27/2020] vancomycin  125 mg Oral Q3 days    Continuous Infusions: . lactated ringers 100 mL/hr at 02/22/20 0504     LOS: 3 days     Alma Friendly, MD [facility]  If 7PM-7AM, please contact night-coverage [email or web address] 02/22/2020, 7:15 PM

## 2020-02-23 LAB — PROCALCITONIN: Procalcitonin: 0.2 ng/mL

## 2020-02-23 LAB — CBC WITH DIFFERENTIAL/PLATELET
Abs Immature Granulocytes: 0.1 10*3/uL — ABNORMAL HIGH (ref 0.00–0.07)
Basophils Absolute: 0.1 10*3/uL (ref 0.0–0.1)
Basophils Relative: 0 %
Eosinophils Absolute: 0.6 10*3/uL — ABNORMAL HIGH (ref 0.0–0.5)
Eosinophils Relative: 4 %
HCT: 38 % — ABNORMAL LOW (ref 39.0–52.0)
Hemoglobin: 12 g/dL — ABNORMAL LOW (ref 13.0–17.0)
Immature Granulocytes: 1 %
Lymphocytes Relative: 21 %
Lymphs Abs: 3.5 10*3/uL (ref 0.7–4.0)
MCH: 28.4 pg (ref 26.0–34.0)
MCHC: 31.6 g/dL (ref 30.0–36.0)
MCV: 90 fL (ref 80.0–100.0)
Monocytes Absolute: 0.9 10*3/uL (ref 0.1–1.0)
Monocytes Relative: 5 %
Neutro Abs: 11.6 10*3/uL — ABNORMAL HIGH (ref 1.7–7.7)
Neutrophils Relative %: 69 %
Platelets: 264 10*3/uL (ref 150–400)
RBC: 4.22 MIL/uL (ref 4.22–5.81)
RDW: 15.7 % — ABNORMAL HIGH (ref 11.5–15.5)
WBC: 16.8 10*3/uL — ABNORMAL HIGH (ref 4.0–10.5)
nRBC: 0 % (ref 0.0–0.2)

## 2020-02-23 LAB — COMPREHENSIVE METABOLIC PANEL
ALT: 34 U/L (ref 0–44)
AST: 65 U/L — ABNORMAL HIGH (ref 15–41)
Albumin: 2.2 g/dL — ABNORMAL LOW (ref 3.5–5.0)
Alkaline Phosphatase: 58 U/L (ref 38–126)
Anion gap: 7 (ref 5–15)
BUN: 21 mg/dL (ref 8–23)
CO2: 22 mmol/L (ref 22–32)
Calcium: 7.8 mg/dL — ABNORMAL LOW (ref 8.9–10.3)
Chloride: 104 mmol/L (ref 98–111)
Creatinine, Ser: 1.05 mg/dL (ref 0.61–1.24)
GFR calc Af Amer: >60 mL/min (ref >60)
GFR calc non Af Amer: >60 mL/min (ref >60)
Glucose, Bld: 105 mg/dL — ABNORMAL HIGH (ref 70–99)
Potassium: 3.7 mmol/L (ref 3.5–5.1)
Sodium: 133 mmol/L — ABNORMAL LOW (ref 135–145)
Total Bilirubin: 0.1 mg/dL — ABNORMAL LOW (ref 0.3–1.2)
Total Protein: 4.7 g/dL — ABNORMAL LOW (ref 6.5–8.1)

## 2020-02-23 MED ORDER — SIMETHICONE 80 MG PO CHEW: 80.0000 mg | CHEWABLE_TABLET | Freq: Four times a day (QID) | ORAL | Status: DC | PRN

## 2020-02-23 MED ORDER — DIPHENHYDRAMINE HCL 25 MG PO CAPS
25.0000 mg | ORAL_CAPSULE | Freq: Three times a day (TID) | ORAL | Status: DC | PRN
  Administered 2020-02-23: 25 mg via ORAL
  Filled 2020-02-23: qty 1

## 2020-02-23 MED ORDER — HYDROCODONE-ACETAMINOPHEN 5-325 MG PO TABS
1.0000 | ORAL_TABLET | Freq: Four times a day (QID) | ORAL | Status: DC | PRN
  Administered 2020-02-23 – 2020-02-26 (×8): 1 via ORAL
  Filled 2020-02-23 (×8): qty 1

## 2020-02-23 NOTE — Progress Notes (Signed)
OT Cancellation Note  Patient Details Name: LUNA COLELLA MRN: [id number] DOB: Apr 10, 1941   Cancelled Treatment:    Reason Eval/Treat Not Completed: Pain limiting ability to participate. Patient reports doctor is ordering medications to help with his nerve pain. Is wanting to participate with therapy but reports in too much pain at this time. Will re-attempt as schedule permits.  Delbert Phenix OT Pager: [place] 02/23/2020, 10:53 AM

## 2020-02-23 NOTE — Plan of Care (Signed)

## 2020-02-23 NOTE — Evaluation (Signed)
Occupational Therapy Evaluation Patient Details Name: Brandon Haynes MRN: [id number] DOB: 1940-12-04 Today's Date: 02/23/2020    History of Present Illness Pt is a 79 y/o male admitted secondary to sepsis from C-diff colitis. PMH includes HTN, CAD, and bladder cancer.    Clinical Impression   Mr. Jenrry Gerring is a 79 year old man who presented to hospital with c.diff. On evaluation he demonstrates normal ROM and strength of upper extremities, ability to perform lower body dressing and reports independence with toileting - with nursing walking him to bathroom. Patient reports he ambulated in the hall with physical therapy. Reports he feels 80% back to normal. Reports no OT needs.    Follow Up Recommendations  No OT follow up    Equipment Recommendations       Recommendations for Other Services       Precautions / Restrictions Precautions Precautions: Fall Restrictions Weight Bearing Restrictions: No      Mobility Bed Mobility Overal bed mobility: Modified Independent Bed Mobility: Supine to Sit     Supine to sit: Supervision     General bed mobility comments: Independent to transfer to side of bed.  Transfers Overall transfer level: Needs assistance Equipment used: Rolling walker (2 wheeled) Transfers: Sit to/from Stand Sit to Stand: Supervision         General transfer comment: Patient stood with supervision and ambulated to the head of the bed.    Balance Overall balance assessment: No apparent balance deficits (not formally assessed) Sitting-balance support: No upper extremity supported;Feet supported Sitting balance-Leahy Scale: Good     Standing balance support: Bilateral upper extremity supported;During functional activity Standing balance-Leahy Scale: Poor Standing balance comment: Reliant on BUE support                            ADL either performed or assessed with clinical judgement   ADL Overall ADL's : Modified independent                                       General ADL Comments: INdependent to perform dressing at side of bed. Reports ambulating to the bathroom with nursing and RW. Reports ability to perform toileting independently.     Vision Baseline Vision/History: Wears glasses Vision Assessment?: No apparent visual deficits     Perception     Praxis      Pertinent Vitals/Pain Pain Assessment: No/denies pain     Hand Dominance Right   Extremity/Trunk Assessment Upper Extremity Assessment Upper Extremity Assessment: Overall WFL for tasks assessed   Lower Extremity Assessment Lower Extremity Assessment: Defer to PT evaluation   Cervical / Trunk Assessment Cervical / Trunk Assessment: Normal   Communication Communication Communication: No difficulties   Cognition Arousal/Alertness: Awake/alert Behavior During Therapy: WFL for tasks assessed/performed Overall Cognitive Status: Within Functional Limits for tasks assessed                                     General Comments       Exercises     Shoulder Instructions      Home Living Family/patient expects to be discharged to:: Private residence Living Arrangements: Spouse/significant other Available Help at Discharge: Available 24 hours/day;Family Type of Home: House Home Access: Stairs to enter [employer] of Steps: 1 Entrance  Stairs-Rails: None Home Layout: One level     Bathroom Shower/Tub: Teacher, early years/pre: Standard     Home Equipment: Environmental consultant - 2 wheels          Prior Functioning/Environment Level of Independence: Independent with assistive device(s)        Comments: Had been using RW since he was getting weaker        OT Problem List:        OT Treatment/Interventions:      OT Goals(Current goals can be found in the care plan section) Acute Rehab OT Goals Patient Stated Goal: Reports no OT needs, hoping to go home Monday or Tuesday. OT Goal  Formulation: All assessment and education complete, DC therapy  OT Frequency:     Barriers to D/C:            Co-evaluation              AM-PAC OT "6 Clicks" Daily Activity     Outcome Measure Help from another person eating meals?: None Help from another person taking care of personal grooming?: None Help from another person toileting, which includes using toliet, bedpan, or urinal?: A Little Help from another person bathing (including washing, rinsing, drying)?: None Help from another person to put on and taking off regular upper body clothing?: None Help from another person to put on and taking off regular lower body clothing?: None 6 Click Score: 23   End of Session    Activity Tolerance: Patient tolerated treatment well Patient left: in bed;with call bell/phone within reach;with bed alarm set  OT Visit Diagnosis: Unsteadiness on feet (R26.81);Muscle weakness (generalized) (M62.81)                Time: [id number] OT Time Calculation (min): 14 min Charges:  OT General Charges $OT Visit: 1 Visit OT Evaluation $OT Eval Low Complexity: 1 Low  Manika Hast, OTR/L [place]  Office [phone]   Lenward Chancellor 02/23/2020, 4:03 PM

## 2020-02-23 NOTE — Plan of Care (Signed)
  Problem: Activity: Goal: Risk for activity intolerance will decrease Outcome: Progressing   Problem: Education: Goal: Knowledge of General Education information will improve Description: Including pain rating scale, medication(s)/side effects and non-pharmacologic comfort measures Outcome: Completed/Met

## 2020-02-23 NOTE — Evaluation (Signed)
Physical Therapy Evaluation Patient Details Name: Brandon Haynes MRN: [id number] DOB: 03-02-1941 Today's Date: 02/23/2020   History of Present Illness  Pt is a 79 y/o male admitted secondary to sepsis from C-diff colitis. PMH includes HTN, CAD, and bladder cancer.   Clinical Impression  Pt admitted secondary to problem above with deficits below. Pt requiring min guard A for mobility tasks using RW. Tolerated gait training well. Pt reports wife can assist as needed at home. Also reports daughter is a PT, and that she can follow up with him when he gets home. Will continue to follow acutely to maximize functional mobility independence and safety.     Follow Up Recommendations No PT follow up;Supervision for mobility/OOB(refusing HHPT; reports daughter is a PT )    Equipment Recommendations  None recommended by PT    Recommendations for Other Services       Precautions / Restrictions Precautions Precautions: Fall Restrictions Weight Bearing Restrictions: No      Mobility  Bed Mobility Overal bed mobility: Needs Assistance Bed Mobility: Supine to Sit     Supine to sit: Supervision     General bed mobility comments: Supervision for safety and line management.   Transfers Overall transfer level: Needs assistance Equipment used: Rolling walker (2 wheeled) Transfers: Sit to/from Stand Sit to Stand: Min guard         General transfer comment: Min guard for safety.   Ambulation/Gait Ambulation/Gait assistance: Min guard Gait Distance (Feet): 100 Feet Assistive device: Rolling walker (2 wheeled) Gait Pattern/deviations: Step-through pattern;Decreased stride length Gait velocity: Decreased   General Gait Details: Overall steady gait with use of RW. No LOB noted.   Stairs            Wheelchair Mobility    Modified Rankin (Stroke Patients Only)       Balance Overall balance assessment: Needs assistance Sitting-balance support: No upper extremity  supported;Feet supported Sitting balance-Leahy Scale: Good     Standing balance support: Bilateral upper extremity supported;During functional activity Standing balance-Leahy Scale: Poor Standing balance comment: Reliant on BUE support                              Pertinent Vitals/Pain Pain Assessment: No/denies pain    Home Living Family/patient expects to be discharged to:: Private residence Living Arrangements: Spouse/significant other Available Help at Discharge: Available 24 hours/day;Family Type of Home: House Home Access: Stairs to enter Entrance Stairs-Rails: None Entrance Stairs-Number of Steps: 1 Home Layout: One level Home Equipment: Environmental consultant - 2 wheels      Prior Function Level of Independence: Independent with assistive device(s)         Comments: Had been using RW since he was getting weaker     Hand Dominance   Dominant Hand: Right    Extremity/Trunk Assessment   Upper Extremity Assessment Upper Extremity Assessment: Defer to OT evaluation    Lower Extremity Assessment Lower Extremity Assessment: Generalized weakness    Cervical / Trunk Assessment Cervical / Trunk Assessment: Normal  Communication   Communication: No difficulties  Cognition Arousal/Alertness: Awake/alert Behavior During Therapy: WFL for tasks assessed/performed Overall Cognitive Status: Within Functional Limits for tasks assessed                                        General Comments      Exercises  Assessment/Plan    PT Assessment Patient needs continued PT services  PT Problem List Decreased strength;Decreased mobility;Decreased activity tolerance       PT Treatment Interventions Gait training;DME instruction;Stair training;Functional mobility training;Therapeutic activities;Therapeutic exercise;Balance training;Patient/family education    PT Goals (Current goals can be found in the Care Plan section)  Acute Rehab PT Goals Patient  Stated Goal: to go home PT Goal Formulation: With patient Time For Goal Achievement: 03/08/20 Potential to Achieve Goals: Good    Frequency Min 3X/week   Barriers to discharge        Co-evaluation               AM-PAC PT "6 Clicks" Mobility  Outcome Measure Help needed turning from your back to your side while in a flat bed without using bedrails?: None Help needed moving from lying on your back to sitting on the side of a flat bed without using bedrails?: None Help needed moving to and from a bed to a chair (including a wheelchair)?: A Little Help needed standing up from a chair using your arms (e.g., wheelchair or bedside chair)?: A Little Help needed to walk in hospital room?: A Little Help needed climbing 3-5 steps with a railing? : A Lot 6 Click Score: 19    End of Session   Activity Tolerance: Patient tolerated treatment well Patient left: in chair;with call bell/phone within reach;with chair alarm set Nurse Communication: Mobility status PT Visit Diagnosis: Other abnormalities of gait and mobility (R26.89)    Time: [id number] PT Time Calculation (min) (ACUTE ONLY): 23 min   Charges:   PT Evaluation $PT Eval Low Complexity: 1 Low PT Treatments $Gait Training: 8-22 mins        Lou Miner, DPT  Acute Rehabilitation Services  Pager: [phone] Office: [phone]   Rudean Hitt 02/23/2020, 3:51 PM

## 2020-02-23 NOTE — Progress Notes (Signed)
PROGRESS NOTE  Brandon Haynes [id number] DOB: [birth year] DOA: 02/19/2020 PCP: Tammi Sou, MD  HPI/Recap of past 24 hours: HPI from Dr Neldon Labella Brandon Haynes is a 79 y.o. male with history of CAD, bladder cancer status post ileal conduit, hypertension has been experiencing watery diarrhea multiple episodes over the last 1 week. Given the symptoms patient presented to the ER at [facility]. Reported associated crampy diffuse abdominal discomfort. Patient was recently treated for C. difficile colitis in April for which patient took 10 days of vancomycin oral.  Denies taking any other antibiotics.  Denies any sick contacts or recent travel. In the ER patient was hypotensive was given fluid bolus following which patient responded.  CT angiogram of the chest and CT abdomen and pelvis was done, negative for pulmonary embolism.  CT abdomen pelvis shows diffuse colitis. Labs show WBC count of 29.6, Creatinine increased from baseline of around normal to 1.5. Urine shows WBC and large leukocyte esterase.  C. difficile came back positive.  Patient started on oral vancomycin admitted for sepsis secondary to C. difficile colitis.     Today, patient reports chronic back pain, bloating, had loose stool, denies any abdominal pain, nausea/vomiting, fever/chills, chest pain, shortness of breath.     Assessment/Plan: Principal Problem:   Sepsis ([place]) Active Problems:   Essential hypertension   Colitis   Sepsis likely 2/2 C. difficile colitis Tachycardic, hypotensive with leukocytosis on admission Currently afebrile, still with leukocytosis LA WNL C. difficile stool came back positive CT abdomen pelvis shows diffuse colitis Procalcitonin 0.18-->0.20 Repeat urine culture pending BC x2 NGTD S/P IV fluids, continue p.o. vancomycin  AKI Resolved Likely from above  Hypertension BP improving, although still intermittently soft Encourage oral hydration  History of CAD    Currently chest pain-free Continue aspirin, statin, metoprolol  History of bladder cancer status post chemotherapy and ileal conduit Outpatient oncology        Malnutrition Type:      Malnutrition Characteristics:      Nutrition Interventions:       Estimated body mass index is 26.32 kg/m as calculated from the following:   Height as of this encounter: 6\' 2"  (1.88 m).   Weight as of this encounter: 93 kg.     Code Status: Full  Family Communication: Discussed with patient, plan to call wife  Disposition Plan: Status is: Inpatient  Remains inpatient appropriate because:Inpatient level of care appropriate due to severity of illness   Dispo: The patient is from: Home              Anticipated d/c is to: Home              Anticipated d/c date is: 2 days              Patient currently is not medically stable to d/c.  Still with loose stools   Consultants:  None  Procedures:  None  Antimicrobials:  P.o. vancomycin  DVT prophylaxis: Lovenox   Objective: Vitals:   02/22/20 1325 02/22/20 2121 02/22/20 2207 02/23/20 0706  BP: 100/66  101/63 96/72  Pulse: 88 97 91 85  Resp: 18  20 20   Temp: 97.8 F (36.6 C)  98.8 F (37.1 C) 98.4 F (36.9 C)  TempSrc: Oral  Oral Oral  SpO2: 96%  96% 97%  Weight:      Height:        Intake/Output Summary (Last 24 hours) at 02/23/2020 1626 Last data filed at  02/23/2020 1552 Gross per 24 hour  Intake 1760.23 ml  Output 1500 ml  Net 260.23 ml   Filed Weights   02/19/20 1523  Weight: 93 kg    Exam:  General: NAD   Cardiovascular: S1, S2 present  Respiratory: CTAB  Abdomen: Soft, NT, nondistended, bowel sounds present  Musculoskeletal: No bilateral pedal edema noted  Skin: Normal  Psychiatry: Normal mood    Data Reviewed: CBC: Recent Labs  Lab 02/19/20 1547 02/20/20 0431 02/21/20 0500 02/22/20 0322 02/23/20 0323  WBC 29.6* 25.2* 26.5* 26.4* 16.8*  NEUTROABS 23.9* 19.8* 20.7* 19.0*  11.6*  HGB 13.8 12.9* 12.2* 12.4* 12.0*  HCT 42.4 40.1 38.7* 40.0 38.0*  MCV 89.3 90.5 91.7 91.3 90.0  PLT 253 223 221 266 [id number]   Basic Metabolic Panel: Recent Labs  Lab 02/19/20 1547 02/20/20 0431 02/21/20 0500 02/22/20 0322 02/23/20 0323  NA 132* 134* 133* 133* 133*  K 4.1 4.3 4.1 3.9 3.7  CL 101 106 104 102 104  CO2 19* 20* 22 20* 22  GLUCOSE 180* 120* 114* 111* 105*  BUN 22 19 22 22 21   CREATININE 1.52* 1.08 1.08 1.02 1.05  CALCIUM 8.3* 8.0* 7.8* 7.7* 7.8*  MG  --   --  1.9  --   --   PHOS  --   --  4.0  --   --    GFR: Estimated Creatinine Clearance: 68.5 mL/min (by C-G formula based on SCr of 1.05 mg/dL). Liver Function Tests: Recent Labs  Lab 02/19/20 1547 02/20/20 0431 02/21/20 0500 02/22/20 0322 02/23/20 0323  AST 12* 11* 11* 24 65*  ALT 11 8 8 13  34  ALKPHOS 52 47 50 61 58  BILITOT 0.8 0.7 0.5 0.3 0.1*  PROT 5.6* 4.7* 4.4* 4.7* 4.7*  ALBUMIN 2.7* 2.3* 2.0* 2.1* 2.2*   No results for input(s): LIPASE, AMYLASE in the last 168 hours. No results for input(s): AMMONIA in the last 168 hours. Coagulation Profile: Recent Labs  Lab 02/19/20 1547 02/20/20 0431  INR 1.1 1.2   Cardiac Enzymes: No results for input(s): CKTOTAL, CKMB, CKMBINDEX, TROPONINI in the last 168 hours. BNP (last 3 results) No results for input(s): PROBNP in the last 8760 hours. HbA1C: No results for input(s): HGBA1C in the last 72 hours. CBG: Recent Labs  Lab 02/19/20 1523  GLUCAP 183*   Lipid Profile: No results for input(s): CHOL, HDL, LDLCALC, TRIG, CHOLHDL, LDLDIRECT in the last 72 hours. Thyroid Function Tests: No results for input(s): TSH, T4TOTAL, FREET4, T3FREE, THYROIDAB in the last 72 hours. Anemia Panel: No results for input(s): VITAMINB12, FOLATE, FERRITIN, TIBC, IRON, RETICCTPCT in the last 72 hours. Urine analysis:    Component Value Date/Time   COLORURINE YELLOW 02/19/2020 1548   APPEARANCEUR CLOUDY (A) 02/19/2020 1548   LABSPEC 1.015 02/19/2020 1548    PHURINE 6.5 02/19/2020 1548   GLUCOSEU NEGATIVE 02/19/2020 1548   HGBUR TRACE (A) 02/19/2020 1548   BILIRUBINUR NEGATIVE 02/19/2020 1548   BILIRUBINUR negative 05/04/2019 1554   [place] 02/19/2020 1548   PROTEINUR 30 (A) 02/19/2020 1548   UROBILINOGEN 0.2 05/04/2019 1554   NITRITE NEGATIVE 02/19/2020 1548   LEUKOCYTESUR LARGE (A) 02/19/2020 1548   Sepsis Labs: @LABRCNTIP (procalcitonin:4,lacticidven:4)  ) Recent Results (from the past 240 hour(s))  Blood Culture (routine x 2)     Status: None (Preliminary result)   Collection Time: 02/19/20  3:45 PM   Specimen: BLOOD  Result Value Ref Range Status   Specimen Description BLOOD CHEST PORTA CATH  Final   Special Requests   Final    BOTTLES DRAWN AEROBIC AND ANAEROBIC Blood Culture adequate volume   Culture   Final    NO GROWTH 4 DAYS Performed at [facility], [address] [place], [place] [address]    Report Status PENDING  Incomplete  SARS Coronavirus 2 by RT PCR (hospital order, performed in [facility] hospital lab) Nasopharyngeal Nasopharyngeal Swab     Status: None   Collection Time: 02/19/20  3:47 PM   Specimen: Nasopharyngeal Swab  Result Value Ref Range Status   SARS Coronavirus 2 NEGATIVE NEGATIVE Final    Comment: (NOTE) SARS-CoV-2 target nucleic acids are NOT DETECTED. The SARS-CoV-2 RNA is generally detectable in upper and lower respiratory specimens during the acute phase of infection. The lowest concentration of SARS-CoV-2 viral copies this assay can detect is 250 copies / mL. A negative result does not preclude SARS-CoV-2 infection and should not be used as the sole basis for treatment or other patient management decisions.  A negative result may occur with improper specimen collection / handling, submission of specimen other than nasopharyngeal swab, presence of viral mutation(s) within the areas targeted by this assay, and inadequate number of viral copies (<250 copies / mL). A negative  result must be combined with clinical observations, patient history, and epidemiological information. Fact Sheet for Patients:   StrictlyIdeas.no Fact Sheet for Healthcare Providers: BankingDealers.co.za This test is not yet approved or cleared  by the [place] FDA and has been authorized for detection and/or diagnosis of SARS-CoV-2 by FDA under an Emergency Use Authorization (EUA).  This EUA will remain in effect (meaning this test can be used) for the duration of the COVID-19 declaration under Section 564(b)(1) of the Act, 21 U.S.C. section 360bbb-3(b)(1), unless the authorization is terminated or revoked sooner. Performed at [facility], [address] [place], [place] [address]   Urine culture     Status: Abnormal   Collection Time: 02/19/20  3:48 PM   Specimen: In/Out Cath Urine  Result Value Ref Range Status   Specimen Description   Final    IN/OUT CATH URINE Performed at [facility], [address] [place], [place] [address]    Special Requests   Final    NONE Performed at [facility], [address] [place], [place] [address]    Culture MULTIPLE SPECIES PRESENT, SUGGEST RECOLLECTION (A)  Final   Report Status 02/20/2020 FINAL  Final  Gastrointestinal Panel by PCR , Stool     Status: None   Collection Time: 02/19/20  6:50 PM   Specimen: STOOL  Result Value Ref Range Status   Campylobacter species NOT DETECTED NOT DETECTED Final   Plesimonas shigelloides NOT DETECTED NOT DETECTED Final   Salmonella species NOT DETECTED NOT DETECTED Final   Yersinia enterocolitica NOT DETECTED NOT DETECTED Final   Vibrio species NOT DETECTED NOT DETECTED Final   Vibrio cholerae NOT DETECTED NOT DETECTED Final   Enteroaggregative E coli (EAEC) NOT DETECTED NOT DETECTED Final   Enteropathogenic E coli (EPEC) NOT DETECTED NOT DETECTED Final   Enterotoxigenic E coli (ETEC) NOT DETECTED NOT DETECTED  Final   Shiga like toxin producing E coli (STEC) NOT DETECTED NOT DETECTED Final   Shigella/Enteroinvasive E coli (EIEC) NOT DETECTED NOT DETECTED Final   Cryptosporidium NOT DETECTED NOT DETECTED Final   Cyclospora cayetanensis NOT DETECTED NOT DETECTED Final   Entamoeba histolytica NOT DETECTED NOT DETECTED Final  Giardia lamblia NOT DETECTED NOT DETECTED Final   Adenovirus F40/41 NOT DETECTED NOT DETECTED Final   Astrovirus NOT DETECTED NOT DETECTED Final   Norovirus GI/GII NOT DETECTED NOT DETECTED Final   Rotavirus A NOT DETECTED NOT DETECTED Final   Sapovirus (I, II, IV, and V) NOT DETECTED NOT DETECTED Final    Comment: Performed at [facility], [address], [place], [place] [address]  C Difficile Quick Screen w PCR reflex     Status: Abnormal   Collection Time: 02/19/20  7:50 PM   Specimen: STOOL  Result Value Ref Range Status   C Diff antigen NEGATIVE NEGATIVE Final   C Diff toxin POSITIVE (A) NEGATIVE Final   C Diff interpretation Results are indeterminate. See PCR results.  Final  C. Diff by PCR, Reflexed     Status: Abnormal   Collection Time: 02/19/20  7:50 PM  Result Value Ref Range Status   Toxigenic C. Difficile by PCR POSITIVE (A) NEGATIVE Final    Comment: Positive for toxigenic C. difficile with little to no toxin production. Only treat if clinical presentation suggests symptomatic illness. Performed at [facility], [place] [address], [place], [place] [address]   MRSA PCR Screening     Status: None   Collection Time: 02/20/20  1:33 AM   Specimen: Nasal Mucosa; Nasopharyngeal  Result Value Ref Range Status   MRSA by PCR NEGATIVE NEGATIVE Final    Comment:        The GeneXpert MRSA Assay (FDA approved for NASAL specimens only), is one component of a comprehensive MRSA colonization surveillance program. It is not intended to diagnose MRSA infection nor to guide or monitor treatment for MRSA infections. Performed at [facility], [place] [address] [place], [place] [address]   Culture, blood (routine x 2)     Status: None (Preliminary result)   Collection Time: 02/22/20  8:37 AM   Specimen: BLOOD  Result Value Ref Range Status   Specimen Description   Final    BLOOD RIGHT ARM Performed at [facility] [address], [place], [place] [address]    Special Requests   Final    BOTTLES DRAWN AEROBIC AND ANAEROBIC Blood Culture adequate volume Performed at [facility] [address], [place], [place] [address]    Culture   Final    NO GROWTH < 24 HOURS Performed at [facility] [address], [place], [place] [address]    Report Status PENDING  Incomplete  Culture, blood (routine x 2)     Status: None (Preliminary result)   Collection Time: 02/22/20  8:37 AM   Specimen: BLOOD  Result Value Ref Range Status   Specimen Description   Final    BLOOD RIGHT ARM Performed at [facility] [address], [place], [place] [address]    Special Requests   Final    BOTTLES DRAWN AEROBIC AND ANAEROBIC Blood Culture adequate volume Performed at [facility] [address] [place], [place] [address]    Culture   Final    NO GROWTH < 24 HOURS Performed at [facility] [address] [employer] [place], [place] [address]    Report Status PENDING  Incomplete      Studies: No results found.  Scheduled Meds: . allopurinol  300 mg Oral Daily  . aspirin EC  81 mg Oral Daily  . Chlorhexidine Gluconate Cloth  6 each Topical Daily  .  enoxaparin (LOVENOX) injection  40 mg Subcutaneous Q24H  . fluticasone  1 spray Each Nare Daily  . gabapentin  600 mg Oral q morning - 10a   And  . gabapentin  300 mg Oral Q1500  . metoprolol tartrate  12.5 mg Oral BID  . rosuvastatin  20 mg Oral QPM  . sodium chloride flush  10-40 mL Intracatheter Q12H  . vancomycin  125 mg Oral QID   Followed by  . [START ON 03/04/2020] vancomycin  125 mg Oral  BID   Followed by  . [START ON 03/12/2020] vancomycin  125 mg Oral Daily   Followed by  . [START ON 03/19/2020] vancomycin  125 mg Oral QODAY   Followed by  . [START ON 03/27/2020] vancomycin  125 mg Oral Q3 days    Continuous Infusions:    LOS: 4 days     Alma Friendly, MD [facility]  If 7PM-7AM, please contact night-coverage [email or web address] 02/23/2020, 4:26 PM

## 2020-02-24 LAB — URINE CULTURE: Culture: >=100000 — AB

## 2020-02-24 LAB — COMPREHENSIVE METABOLIC PANEL
ALT: 27 U/L (ref 0–44)
AST: 39 U/L (ref 15–41)
Albumin: 1.6 g/dL — ABNORMAL LOW (ref 3.5–5.0)
Alkaline Phosphatase: 45 U/L (ref 38–126)
Anion gap: 5 (ref 5–15)
BUN: 16 mg/dL (ref 8–23)
CO2: 18 mmol/L — ABNORMAL LOW (ref 22–32)
Calcium: 6.3 mg/dL — CL (ref 8.9–10.3)
Chloride: 110 mmol/L (ref 98–111)
Creatinine, Ser: 0.74 mg/dL (ref 0.61–1.24)
GFR calc Af Amer: >60 mL/min (ref >60)
GFR calc non Af Amer: >60 mL/min (ref >60)
Glucose, Bld: 72 mg/dL (ref 70–99)
Potassium: 3 mmol/L — ABNORMAL LOW (ref 3.5–5.1)
Sodium: 133 mmol/L — ABNORMAL LOW (ref 135–145)
Total Bilirubin: 0.4 mg/dL (ref 0.3–1.2)
Total Protein: 3.6 g/dL — ABNORMAL LOW (ref 6.5–8.1)

## 2020-02-24 LAB — CBC WITH DIFFERENTIAL/PLATELET
Abs Immature Granulocytes: 0.13 10*3/uL — ABNORMAL HIGH (ref 0.00–0.07)
Basophils Absolute: 0.1 10*3/uL (ref 0.0–0.1)
Basophils Relative: 0 %
Eosinophils Absolute: 0.6 10*3/uL — ABNORMAL HIGH (ref 0.0–0.5)
Eosinophils Relative: 5 %
HCT: 35.1 % — ABNORMAL LOW (ref 39.0–52.0)
Hemoglobin: 11.1 g/dL — ABNORMAL LOW (ref 13.0–17.0)
Immature Granulocytes: 1 %
Lymphocytes Relative: 23 %
Lymphs Abs: 2.8 10*3/uL (ref 0.7–4.0)
MCH: 28.5 pg (ref 26.0–34.0)
MCHC: 31.6 g/dL (ref 30.0–36.0)
MCV: 90.2 fL (ref 80.0–100.0)
Monocytes Absolute: 0.7 10*3/uL (ref 0.1–1.0)
Monocytes Relative: 6 %
Neutro Abs: 8 10*3/uL — ABNORMAL HIGH (ref 1.7–7.7)
Neutrophils Relative %: 65 %
Platelets: 272 10*3/uL (ref 150–400)
RBC: 3.89 MIL/uL — ABNORMAL LOW (ref 4.22–5.81)
RDW: 15.8 % — ABNORMAL HIGH (ref 11.5–15.5)
WBC: 12.3 10*3/uL — ABNORMAL HIGH (ref 4.0–10.5)
nRBC: 0 % (ref 0.0–0.2)

## 2020-02-24 LAB — PROCALCITONIN: Procalcitonin: <0.1 ng/mL

## 2020-02-24 LAB — CULTURE, BLOOD (ROUTINE X 2)
Culture: NO GROWTH
Special Requests: ADEQUATE

## 2020-02-24 MED ORDER — CALCIUM GLUCONATE-NACL 1-0.675 GM/50ML-% IV SOLN
1.0000 g | Freq: Once | INTRAVENOUS | Status: AC
  Administered 2020-02-24: 1000 mg via INTRAVENOUS
  Filled 2020-02-24: qty 50

## 2020-02-24 MED ORDER — SACCHAROMYCES BOULARDII 250 MG PO CAPS
250.0000 mg | ORAL_CAPSULE | Freq: Two times a day (BID) | ORAL | Status: DC
  Administered 2020-02-24 – 2020-02-26 (×5): 250 mg via ORAL
  Filled 2020-02-24 (×5): qty 1

## 2020-02-24 MED ORDER — POTASSIUM CHLORIDE CRYS ER 20 MEQ PO TBCR
40.0000 meq | EXTENDED_RELEASE_TABLET | Freq: Once | ORAL | Status: AC
  Administered 2020-02-24: 40 meq via ORAL
  Filled 2020-02-24: qty 2

## 2020-02-24 NOTE — Plan of Care (Signed)

## 2020-02-24 NOTE — Progress Notes (Signed)
PROGRESS NOTE  Brandon Haynes [id number] DOB: [birth year] DOA: 02/19/2020 PCP: Tammi Sou, MD  HPI/Recap of past 24 hours: HPI from Dr Neldon Labella Brandon Haynes is a 79 y.o. male with history of CAD, bladder cancer status post ileal conduit, hypertension has been experiencing watery diarrhea multiple episodes over the last 1 week. Given the symptoms patient presented to the ER at [facility]. Reported associated crampy diffuse abdominal discomfort. Patient was recently treated for C. difficile colitis in April for which patient took 10 days of vancomycin oral.  Denies taking any other antibiotics.  Denies any sick contacts or recent travel. In the ER patient was hypotensive was given fluid bolus following which patient responded.  CT angiogram of the chest and CT abdomen and pelvis was done, negative for pulmonary embolism.  CT abdomen pelvis shows diffuse colitis. Labs show WBC count of 29.6, Creatinine increased from baseline of around normal to 1.5. Urine shows WBC and large leukocyte esterase.  C. difficile came back positive.  Patient started on oral vancomycin admitted for sepsis secondary to C. difficile colitis.     Today, patient denies any new complaints, reports having about 3 loose stools over the past 24 hours, denies any worsening abdominal pain, nausea/vomiting, fever/chills, chest pain, shortness of breath.     Assessment/Plan: Principal Problem:   Sepsis ([place]) Active Problems:   Essential hypertension   Colitis   Sepsis likely 2/2 C. difficile colitis Tachycardic, hypotensive with leukocytosis on admission Currently afebrile, still with leukocytosis LA WNL C. difficile stool came back positive CT abdomen pelvis shows diffuse colitis Procalcitonin 0.18-->0.20--> <0.10 BC x2 NGTD Probiotics S/P IV fluids, continue tapered dose of p.o. vancomycin  AKI Resolved Likely from above  Hypokalemia/Hypocalcemia Replace prn  Hypertension BP  improving Encourage oral hydration  History of CAD  Currently chest pain-free Continue aspirin, statin, metoprolol  History of bladder cancer status post chemotherapy and ileal conduit Outpatient oncology        Malnutrition Type:      Malnutrition Characteristics:      Nutrition Interventions:       Estimated body mass index is 26.32 kg/m as calculated from the following:   Height as of this encounter: 6\' 2"  (1.88 m).   Weight as of this encounter: 93 kg.     Code Status: Full  Family Communication: Discussed with wife on 02/23/20  Disposition Plan: Status is: Inpatient  Remains inpatient appropriate because:Inpatient level of care appropriate due to severity of illness   Dispo: The patient is from: Home              Anticipated d/c is to: Home              Anticipated d/c date is: 2 days              Patient currently is not medically stable to d/c.  Still with loose stools   Consultants:  None  Procedures:  None  Antimicrobials:  P.o. vancomycin  DVT prophylaxis: Lovenox   Objective: Vitals:   02/22/20 2207 02/23/20 0706 02/23/20 2046 02/24/20 0612  BP: 101/63 96/72 111/72 121/79  Pulse: 91 85 86 89  Resp: 20 20 18 18   Temp: 98.8 F (37.1 C) 98.4 F (36.9 C) 97.7 F (36.5 C) (!) 97.5 F (36.4 C)  TempSrc: Oral Oral Oral Oral  SpO2: 96% 97% 95% 96%  Weight:      Height:        Intake/Output Summary (  Last 24 hours) at 02/24/2020 1425 Last data filed at 02/23/2020 2200 Gross per 24 hour  Intake 70 ml  Output 700 ml  Net -630 ml   Filed Weights   02/19/20 1523  Weight: 93 kg    Exam:  General: NAD   Cardiovascular: S1, S2 present  Respiratory: CTAB  Abdomen: Soft, NT, nondistended, bowel sounds present  Musculoskeletal: No bilateral pedal edema noted  Skin: Normal  Psychiatry: Normal mood    Data Reviewed: CBC: Recent Labs  Lab 02/20/20 0431 02/21/20 0500 02/22/20 0322 02/23/20 0323 02/24/20 0506    WBC 25.2* 26.5* 26.4* 16.8* 12.3*  NEUTROABS 19.8* 20.7* 19.0* 11.6* 8.0*  HGB 12.9* 12.2* 12.4* 12.0* 11.1*  HCT 40.1 38.7* 40.0 38.0* 35.1*  MCV 90.5 91.7 91.3 90.0 90.2  PLT 223 221 266 264 [id number]   Basic Metabolic Panel: Recent Labs  Lab 02/20/20 0431 02/21/20 0500 02/22/20 0322 02/23/20 0323 02/24/20 0506  NA 134* 133* 133* 133* 133*  K 4.3 4.1 3.9 3.7 3.0*  CL 106 104 102 104 110  CO2 20* 22 20* 22 18*  GLUCOSE 120* 114* 111* 105* 72  BUN 19 22 22 21 16   CREATININE 1.08 1.08 1.02 1.05 0.74  CALCIUM 8.0* 7.8* 7.7* 7.8* 6.3*  MG  --  1.9  --   --   --   PHOS  --  4.0  --   --   --    GFR: Estimated Creatinine Clearance: 89.9 mL/min (by C-G formula based on SCr of 0.74 mg/dL). Liver Function Tests: Recent Labs  Lab 02/20/20 0431 02/21/20 0500 02/22/20 0322 02/23/20 0323 02/24/20 0506  AST 11* 11* 24 65* 39  ALT 8 8 13  34 27  ALKPHOS 47 50 61 58 45  BILITOT 0.7 0.5 0.3 0.1* 0.4  PROT 4.7* 4.4* 4.7* 4.7* 3.6*  ALBUMIN 2.3* 2.0* 2.1* 2.2* 1.6*   No results for input(s): LIPASE, AMYLASE in the last 168 hours. No results for input(s): AMMONIA in the last 168 hours. Coagulation Profile: Recent Labs  Lab 02/19/20 1547 02/20/20 0431  INR 1.1 1.2   Cardiac Enzymes: No results for input(s): CKTOTAL, CKMB, CKMBINDEX, TROPONINI in the last 168 hours. BNP (last 3 results) No results for input(s): PROBNP in the last 8760 hours. HbA1C: No results for input(s): HGBA1C in the last 72 hours. CBG: Recent Labs  Lab 02/19/20 1523  GLUCAP 183*   Lipid Profile: No results for input(s): CHOL, HDL, LDLCALC, TRIG, CHOLHDL, LDLDIRECT in the last 72 hours. Thyroid Function Tests: No results for input(s): TSH, T4TOTAL, FREET4, T3FREE, THYROIDAB in the last 72 hours. Anemia Panel: No results for input(s): VITAMINB12, FOLATE, FERRITIN, TIBC, IRON, RETICCTPCT in the last 72 hours. Urine analysis:    Component Value Date/Time   COLORURINE YELLOW 02/19/2020 1548    APPEARANCEUR CLOUDY (A) 02/19/2020 1548   LABSPEC 1.015 02/19/2020 1548   PHURINE 6.5 02/19/2020 1548   GLUCOSEU NEGATIVE 02/19/2020 1548   HGBUR TRACE (A) 02/19/2020 1548   BILIRUBINUR NEGATIVE 02/19/2020 1548   BILIRUBINUR negative 05/04/2019 1554   KETONESUR NEGATIVE 02/19/2020 1548   PROTEINUR 30 (A) 02/19/2020 1548   UROBILINOGEN 0.2 05/04/2019 1554   NITRITE NEGATIVE 02/19/2020 1548   LEUKOCYTESUR LARGE (A) 02/19/2020 1548   Sepsis Labs: @LABRCNTIP (procalcitonin:4,lacticidven:4)  ) Recent Results (from the past 240 hour(s))  Blood Culture (routine x 2)     Status: None   Collection Time: 02/19/20  3:45 PM   Specimen: BLOOD  Result Value Ref Range Status  Specimen Description BLOOD CHEST PORTA CATH  Final   Special Requests   Final    BOTTLES DRAWN AEROBIC AND ANAEROBIC Blood Culture adequate volume   Culture   Final    NO GROWTH 5 DAYS Performed at [facility], [address], [place], [place] [address]    Report Status 02/24/2020 FINAL  Final  SARS Coronavirus 2 by RT PCR (hospital order, performed in [facility] hospital lab) Nasopharyngeal Nasopharyngeal Swab     Status: None   Collection Time: 02/19/20  3:47 PM   Specimen: Nasopharyngeal Swab  Result Value Ref Range Status   SARS Coronavirus 2 NEGATIVE NEGATIVE Final    Comment: (NOTE) SARS-CoV-2 target nucleic acids are NOT DETECTED. The SARS-CoV-2 RNA is generally detectable in upper and lower respiratory specimens during the acute phase of infection. The lowest concentration of SARS-CoV-2 viral copies this assay can detect is 250 copies / mL. A negative result does not preclude SARS-CoV-2 infection and should not be used as the sole basis for treatment or other patient management decisions.  A negative result may occur with improper specimen collection / handling, submission of specimen other than nasopharyngeal swab, presence of viral mutation(s) within the areas targeted by this assay, and  inadequate number of viral copies (<250 copies / mL). A negative result must be combined with clinical observations, patient history, and epidemiological information. Fact Sheet for Patients:   StrictlyIdeas.no Fact Sheet for Healthcare Providers: BankingDealers.co.za This test is not yet approved or cleared  by the [place] FDA and has been authorized for detection and/or diagnosis of SARS-CoV-2 by FDA under an Emergency Use Authorization (EUA).  This EUA will remain in effect (meaning this test can be used) for the duration of the COVID-19 declaration under Section 564(b)(1) of the Act, 21 U.S.C. section 360bbb-3(b)(1), unless the authorization is terminated or revoked sooner. Performed at [facility], [address] [place], [place] [address]   Urine culture     Status: Abnormal   Collection Time: 02/19/20  3:48 PM   Specimen: In/Out Cath Urine  Result Value Ref Range Status   Specimen Description   Final    IN/OUT CATH URINE Performed at [facility], [address] [place], [place] [address]    Special Requests   Final    NONE Performed at [facility], [address] [place], [place] [address]    Culture MULTIPLE SPECIES PRESENT, SUGGEST RECOLLECTION (A)  Final   Report Status 02/20/2020 FINAL  Final  Gastrointestinal Panel by PCR , Stool     Status: None   Collection Time: 02/19/20  6:50 PM   Specimen: STOOL  Result Value Ref Range Status   Campylobacter species NOT DETECTED NOT DETECTED Final   Plesimonas shigelloides NOT DETECTED NOT DETECTED Final   Salmonella species NOT DETECTED NOT DETECTED Final   Yersinia enterocolitica NOT DETECTED NOT DETECTED Final   Vibrio species NOT DETECTED NOT DETECTED Final   Vibrio cholerae NOT DETECTED NOT DETECTED Final   Enteroaggregative E coli (EAEC) NOT DETECTED NOT DETECTED Final   Enteropathogenic E coli (EPEC) NOT DETECTED NOT DETECTED  Final   Enterotoxigenic E coli (ETEC) NOT DETECTED NOT DETECTED Final   Shiga like toxin producing E coli (STEC) NOT DETECTED NOT DETECTED Final   Shigella/Enteroinvasive E coli (EIEC) NOT DETECTED NOT DETECTED Final   Cryptosporidium NOT DETECTED NOT DETECTED Final   Cyclospora cayetanensis NOT DETECTED NOT DETECTED Final  Entamoeba histolytica NOT DETECTED NOT DETECTED Final   Giardia lamblia NOT DETECTED NOT DETECTED Final   Adenovirus F40/41 NOT DETECTED NOT DETECTED Final   Astrovirus NOT DETECTED NOT DETECTED Final   Norovirus GI/GII NOT DETECTED NOT DETECTED Final   Rotavirus A NOT DETECTED NOT DETECTED Final   Sapovirus (I, II, IV, and V) NOT DETECTED NOT DETECTED Final    Comment: Performed at [facility], [address] [place], [place] [address]  C Difficile Quick Screen w PCR reflex     Status: Abnormal   Collection Time: 02/19/20  7:50 PM   Specimen: STOOL  Result Value Ref Range Status   C Diff antigen NEGATIVE NEGATIVE Final   C Diff toxin POSITIVE (A) NEGATIVE Final   C Diff interpretation Results are indeterminate. See PCR results.  Final  C. Diff by PCR, Reflexed     Status: Abnormal   Collection Time: 02/19/20  7:50 PM  Result Value Ref Range Status   Toxigenic C. Difficile by PCR POSITIVE (A) NEGATIVE Final    Comment: Positive for toxigenic C. difficile with little to no toxin production. Only treat if clinical presentation suggests symptomatic illness. Performed at [facility], [place] [address] [place], [place] [address]   MRSA PCR Screening     Status: None   Collection Time: 02/20/20  1:33 AM   Specimen: Nasal Mucosa; Nasopharyngeal  Result Value Ref Range Status   MRSA by PCR NEGATIVE NEGATIVE Final    Comment:        The GeneXpert MRSA Assay (FDA approved for NASAL specimens only), is one component of a comprehensive MRSA colonization surveillance program. It is not intended to diagnose MRSA infection nor to guide or monitor  treatment for MRSA infections. Performed at [facility], [place] [address], [place], [place] [address]   Urine Culture     Status: Abnormal   Collection Time: 02/22/20  8:05 AM   Specimen: Urine, Clean Catch  Result Value Ref Range Status   Specimen Description   Final    URINE, CLEAN CATCH Performed at [facility], [place] [address], [place], [place] [address]    Special Requests   Final    NONE Performed at [facility], [place] [address], [place], [place] [address]    Culture (A)  Final    >=100,000 COLONIES/mL MULTIPLE SPECIES PRESENT, SUGGEST RECOLLECTION   Report Status 02/24/2020 FINAL  Final  Culture, blood (routine x 2)     Status: None (Preliminary result)   Collection Time: 02/22/20  8:37 AM   Specimen: BLOOD  Result Value Ref Range Status   Specimen Description   Final    BLOOD RIGHT ARM Performed at [facility] [address], [place], [place] [address]    Special Requests   Final    BOTTLES DRAWN AEROBIC AND ANAEROBIC Blood Culture adequate volume Performed at [place] [address], [place], [place] [address]    Culture   Final    NO GROWTH 2 DAYS Performed at [facility] [address], [place], [place] [address]    Report Status PENDING  Incomplete  Culture, blood (routine x 2)     Status: None (Preliminary result)   Collection Time: 02/22/20  8:37 AM   Specimen: BLOOD  Result Value Ref Range Status   Specimen Description   Final    BLOOD RIGHT ARM Performed at [facility], [address] [place], [place] [address]    Special Requests   Final    BOTTLES DRAWN AEROBIC AND ANAEROBIC Blood Culture adequate volume Performed at [facility] [address], [place], [place] [address]    Culture   Final    NO GROWTH 2 DAYS Performed at [facility] [address], [place], [place] [address]    Report  Status PENDING  Incomplete      Studies: No results found.  Scheduled Meds: . allopurinol  300 mg Oral Daily  . aspirin EC  81 mg Oral Daily  . Chlorhexidine Gluconate Cloth  6 each Topical Daily  . enoxaparin (LOVENOX) injection  40 mg Subcutaneous Q24H  . fluticasone  1 spray Each Nare Daily  . gabapentin  600 mg Oral q morning - 10a   And  . gabapentin  300 mg Oral Q1500  . metoprolol tartrate  12.5 mg Oral BID  . rosuvastatin  20 mg Oral QPM  . sodium chloride flush  10-40 mL Intracatheter Q12H  . vancomycin  125 mg Oral QID   Followed by  . [START ON 03/04/2020] vancomycin  125 mg Oral BID   Followed by  . [START ON 03/12/2020] vancomycin  125 mg Oral Daily   Followed by  . [START ON 03/19/2020] vancomycin  125 mg Oral QODAY   Followed by  . [START ON 03/27/2020] vancomycin  125 mg Oral Q3 days    Continuous Infusions:    LOS: 5 days     Alma Friendly, MD [facility]  If 7PM-7AM, please contact night-coverage [email or web address] 02/24/2020, 2:25 PM

## 2020-02-24 NOTE — Progress Notes (Addendum)
CRITICAL VALUE ALERT  Critical Value:  Calcium 6.3  Date & Time Notied:  02/24/2020-0627  Provider Notified: Jonny Ruiz NP  Orders Received/Actions taken: Awaiting orders  0632 Calcium gluconate 1g/68mL IV  Once ordered for administration @ 0730

## 2020-02-24 NOTE — Plan of Care (Signed)
Patient tolerating heart healthy diet, had 2 formed stools per patient report on 7 a to 7 p shift.  Walked in hallway with walker.  Daughter and wife in to visit and change ileal conduit appliance.

## 2020-02-25 DIAGNOSIS — A0472 Enterocolitis due to Clostridium difficile, not specified as recurrent: Secondary | ICD-10-CM

## 2020-02-25 LAB — CBC WITH DIFFERENTIAL/PLATELET
Abs Immature Granulocytes: 0.11 10*3/uL — ABNORMAL HIGH (ref 0.00–0.07)
Basophils Absolute: 0.1 10*3/uL (ref 0.0–0.1)
Basophils Relative: 1 %
Eosinophils Absolute: 0.6 10*3/uL — ABNORMAL HIGH (ref 0.0–0.5)
Eosinophils Relative: 7 %
HCT: 34 % — ABNORMAL LOW (ref 39.0–52.0)
Hemoglobin: 10.8 g/dL — ABNORMAL LOW (ref 13.0–17.0)
Immature Granulocytes: 1 %
Lymphocytes Relative: 26 %
Lymphs Abs: 2.3 10*3/uL (ref 0.7–4.0)
MCH: 29 pg (ref 26.0–34.0)
MCHC: 31.8 g/dL (ref 30.0–36.0)
MCV: 91.4 fL (ref 80.0–100.0)
Monocytes Absolute: 0.6 10*3/uL (ref 0.1–1.0)
Monocytes Relative: 6 %
Neutro Abs: 5.2 10*3/uL (ref 1.7–7.7)
Neutrophils Relative %: 59 %
Platelets: 281 10*3/uL (ref 150–400)
RBC: 3.72 MIL/uL — ABNORMAL LOW (ref 4.22–5.81)
RDW: 15.7 % — ABNORMAL HIGH (ref 11.5–15.5)
WBC: 8.9 10*3/uL (ref 4.0–10.5)
nRBC: 0 % (ref 0.0–0.2)

## 2020-02-25 LAB — COMPREHENSIVE METABOLIC PANEL
ALT: 39 U/L (ref 0–44)
AST: 59 U/L — ABNORMAL HIGH (ref 15–41)
Albumin: 2.1 g/dL — ABNORMAL LOW (ref 3.5–5.0)
Alkaline Phosphatase: 54 U/L (ref 38–126)
Anion gap: 6 (ref 5–15)
BUN: 15 mg/dL (ref 8–23)
CO2: 22 mmol/L (ref 22–32)
Calcium: 7.7 mg/dL — ABNORMAL LOW (ref 8.9–10.3)
Chloride: 109 mmol/L (ref 98–111)
Creatinine, Ser: 0.92 mg/dL (ref 0.61–1.24)
GFR calc Af Amer: >60 mL/min (ref >60)
GFR calc non Af Amer: >60 mL/min (ref >60)
Glucose, Bld: 101 mg/dL — ABNORMAL HIGH (ref 70–99)
Potassium: 3.9 mmol/L (ref 3.5–5.1)
Sodium: 137 mmol/L (ref 135–145)
Total Bilirubin: 0.6 mg/dL (ref 0.3–1.2)
Total Protein: 4.4 g/dL — ABNORMAL LOW (ref 6.5–8.1)

## 2020-02-25 NOTE — Progress Notes (Signed)
Patient ambulated 360 feet around entire unit with only standby assist and rolling walker. Tolerated walk well. Pt has had only 1 BM this shift and stated it was formed.

## 2020-02-25 NOTE — Progress Notes (Signed)
PROGRESS NOTE  Brandon Haynes [id number] DOB: 1941/04/05 DOA: 02/19/2020 PCP: Tammi Sou, MD  HPI/Recap of past 24 hours: HPI from Dr Neldon Labella Brandon Haynes is a 79 y.o. male with history of CAD, bladder cancer status post ileal conduit, hypertension has been experiencing watery diarrhea multiple episodes over the last 1 week. Given the symptoms patient presented to the ER at [facility]. Reported associated crampy diffuse abdominal discomfort. Patient was recently treated for C. difficile colitis in April for which patient took 10 days of vancomycin oral.  Denies taking any other antibiotics.  Denies any sick contacts or recent travel. In the ER patient was hypotensive was given fluid bolus following which patient responded.  CT angiogram of the chest and CT abdomen and pelvis was done, negative for pulmonary embolism.  CT abdomen pelvis shows diffuse colitis. Labs show WBC count of 29.6, Creatinine increased from baseline of around normal to 1.5. Urine shows WBC and large leukocyte esterase.  C. difficile came back positive.  Patient started on oral vancomycin admitted for sepsis secondary to C. difficile colitis.    Today, patient reports having formed stools, denies any abdominal pain, nausea/vomiting, fever/chills, chest pain, shortness of breath.  Overall improving.     Assessment/Plan: Principal Problem:   Sepsis ([place]) Active Problems:   Essential hypertension   Colitis   Sepsis likely 2/2 C. difficile colitis Tachycardic, hypotensive with leukocytosis on admission Currently afebrile, resolving leukocytosis LA WNL C. difficile stool came back positive CT abdomen pelvis shows diffuse colitis Procalcitonin 0.18-->0.20--> <0.10 BC x2 NGTD Probiotics S/P IV fluids, continue tapered dose of p.o. vancomycin  AKI Resolved Likely from above  Hypokalemia/Hypocalcemia Replace prn  Hypertension BP improving Encourage oral hydration  History of CAD    Currently chest pain-free Continue aspirin, statin, metoprolol  History of bladder cancer status post chemotherapy and ileal conduit Outpatient oncology        Malnutrition Type:      Malnutrition Characteristics:      Nutrition Interventions:       Estimated body mass index is 26.32 kg/m as calculated from the following:   Height as of this encounter: 6\' 2"  (1.88 m).   Weight as of this encounter: 93 kg.     Code Status: Full  Family Communication: Discussed with wife on 02/23/20  Disposition Plan: Status is: Inpatient  Remains inpatient appropriate because:Inpatient level of care appropriate due to severity of illness   Dispo: The patient is from: Home              Anticipated d/c is to: Home              Anticipated d/c date is: 1 day              Patient currently is not medically stable to d/c.    Consultants:  None  Procedures:  None  Antimicrobials:  P.o. vancomycin  DVT prophylaxis: Lovenox   Objective: Vitals:   02/24/20 1450 02/24/20 2235 02/25/20 0643 02/25/20 1326  BP: 105/67 104/69 116/75 107/65  Pulse: 84 85 79 77  Resp: 16 18 18 16   Temp: 97.6 F (36.4 C) 98.4 F (36.9 C) 98.2 F (36.8 C) 98.5 F (36.9 C)  TempSrc: Oral Oral Oral Oral  SpO2: 100% 95% 92% 100%  Weight:      Height:        Intake/Output Summary (Last 24 hours) at 02/25/2020 1412 Last data filed at 02/25/2020 0900 Gross per 24  hour  Intake 720 ml  Output 1250 ml  Net -530 ml   Filed Weights   02/19/20 1523  Weight: 93 kg    Exam:  General: NAD   Cardiovascular: S1, S2 present  Respiratory: CTAB  Abdomen: Soft, NT, nondistended, bowel sounds present  Musculoskeletal: No bilateral pedal edema noted  Skin: Normal  Psychiatry: Normal mood    Data Reviewed: CBC: Recent Labs  Lab 02/21/20 0500 02/22/20 0322 02/23/20 0323 02/24/20 0506 02/25/20 0400  WBC 26.5* 26.4* 16.8* 12.3* 8.9  NEUTROABS 20.7* 19.0* 11.6* 8.0* 5.2  HGB  12.2* 12.4* 12.0* 11.1* 10.8*  HCT 38.7* 40.0 38.0* 35.1* 34.0*  MCV 91.7 91.3 90.0 90.2 91.4  PLT 221 266 264 272 [id number]   Basic Metabolic Panel: Recent Labs  Lab 02/21/20 0500 02/22/20 0322 02/23/20 0323 02/24/20 0506 02/25/20 0400  NA 133* 133* 133* 133* 137  K 4.1 3.9 3.7 3.0* 3.9  CL 104 102 104 110 109  CO2 22 20* 22 18* 22  GLUCOSE 114* 111* 105* 72 101*  BUN 22 22 21 16 15   CREATININE 1.08 1.02 1.05 0.74 0.92  CALCIUM 7.8* 7.7* 7.8* 6.3* 7.7*  MG 1.9  --   --   --   --   PHOS 4.0  --   --   --   --    GFR: Estimated Creatinine Clearance: 78.2 mL/min (by C-G formula based on SCr of 0.92 mg/dL). Liver Function Tests: Recent Labs  Lab 02/21/20 0500 02/22/20 0322 02/23/20 0323 02/24/20 0506 02/25/20 0400  AST 11* 24 65* 39 59*  ALT 8 13 34 27 39  ALKPHOS 50 61 58 45 54  BILITOT 0.5 0.3 0.1* 0.4 0.6  PROT 4.4* 4.7* 4.7* 3.6* 4.4*  ALBUMIN 2.0* 2.1* 2.2* 1.6* 2.1*   No results for input(s): LIPASE, AMYLASE in the last 168 hours. No results for input(s): AMMONIA in the last 168 hours. Coagulation Profile: Recent Labs  Lab 02/19/20 1547 02/20/20 0431  INR 1.1 1.2   Cardiac Enzymes: No results for input(s): CKTOTAL, CKMB, CKMBINDEX, TROPONINI in the last 168 hours. BNP (last 3 results) No results for input(s): PROBNP in the last 8760 hours. HbA1C: No results for input(s): HGBA1C in the last 72 hours. CBG: Recent Labs  Lab 02/19/20 1523  GLUCAP 183*   Lipid Profile: No results for input(s): CHOL, HDL, LDLCALC, TRIG, CHOLHDL, LDLDIRECT in the last 72 hours. Thyroid Function Tests: No results for input(s): TSH, T4TOTAL, FREET4, T3FREE, THYROIDAB in the last 72 hours. Anemia Panel: No results for input(s): VITAMINB12, FOLATE, FERRITIN, TIBC, IRON, RETICCTPCT in the last 72 hours. Urine analysis:    Component Value Date/Time   COLORURINE YELLOW 02/19/2020 1548   APPEARANCEUR CLOUDY (A) 02/19/2020 1548   LABSPEC 1.015 02/19/2020 1548   PHURINE 6.5  02/19/2020 1548   GLUCOSEU NEGATIVE 02/19/2020 1548   HGBUR TRACE (A) 02/19/2020 1548   BILIRUBINUR NEGATIVE 02/19/2020 1548   BILIRUBINUR negative 05/04/2019 [place] 02/19/2020 1548   PROTEINUR 30 (A) 02/19/2020 1548   UROBILINOGEN 0.2 05/04/2019 1554   NITRITE NEGATIVE 02/19/2020 1548   LEUKOCYTESUR LARGE (A) 02/19/2020 1548   Sepsis Labs: @LABRCNTIP (procalcitonin:4,lacticidven:4)  ) Recent Results (from the past 240 hour(s))  Blood Culture (routine x 2)     Status: None   Collection Time: 02/19/20  3:45 PM   Specimen: BLOOD  Result Value Ref Range Status   Specimen Description BLOOD CHEST PORTA CATH  Final   Special Requests  Final    BOTTLES DRAWN AEROBIC AND ANAEROBIC Blood Culture adequate volume   Culture   Final    NO GROWTH 5 DAYS Performed at [facility], [place] [address] [place], [place] [address]    Report Status 02/24/2020 FINAL  Final  SARS Coronavirus 2 by RT PCR (hospital order, performed in [facility] hospital lab) Nasopharyngeal Nasopharyngeal Swab     Status: None   Collection Time: 02/19/20  3:47 PM   Specimen: Nasopharyngeal Swab  Result Value Ref Range Status   SARS Coronavirus 2 NEGATIVE NEGATIVE Final    Comment: (NOTE) SARS-CoV-2 target nucleic acids are NOT DETECTED. The SARS-CoV-2 RNA is generally detectable in upper and lower respiratory specimens during the acute phase of infection. The lowest concentration of SARS-CoV-2 viral copies this assay can detect is 250 copies / mL. A negative result does not preclude SARS-CoV-2 infection and should not be used as the sole basis for treatment or other patient management decisions.  A negative result may occur with improper specimen collection / handling, submission of specimen other than nasopharyngeal swab, presence of viral mutation(s) within the areas targeted by this assay, and inadequate number of viral copies (<250 copies / mL). A negative result must be combined with  clinical observations, patient history, and epidemiological information. Fact Sheet for Patients:   StrictlyIdeas.no Fact Sheet for Healthcare Providers: BankingDealers.co.za This test is not yet approved or cleared  by the [place] FDA and has been authorized for detection and/or diagnosis of SARS-CoV-2 by FDA under an Emergency Use Authorization (EUA).  This EUA will remain in effect (meaning this test can be used) for the duration of the COVID-19 declaration under Section 564(b)(1) of the Act, 21 U.S.C. section 360bbb-3(b)(1), unless the authorization is terminated or revoked sooner. Performed at [facility], [address] [place], [place] [address]   Urine culture     Status: Abnormal   Collection Time: 02/19/20  3:48 PM   Specimen: In/Out Cath Urine  Result Value Ref Range Status   Specimen Description   Final    IN/OUT CATH URINE Performed at [facility], [address] [place], [place] [address]    Special Requests   Final    NONE Performed at [facility], [address] [place], [place] [address]    Culture MULTIPLE SPECIES PRESENT, SUGGEST RECOLLECTION (A)  Final   Report Status 02/20/2020 FINAL  Final  Gastrointestinal Panel by PCR , Stool     Status: None   Collection Time: 02/19/20  6:50 PM   Specimen: STOOL  Result Value Ref Range Status   Campylobacter species NOT DETECTED NOT DETECTED Final   Plesimonas shigelloides NOT DETECTED NOT DETECTED Final   Salmonella species NOT DETECTED NOT DETECTED Final   Yersinia enterocolitica NOT DETECTED NOT DETECTED Final   Vibrio species NOT DETECTED NOT DETECTED Final   Vibrio cholerae NOT DETECTED NOT DETECTED Final   Enteroaggregative E coli (EAEC) NOT DETECTED NOT DETECTED Final   Enteropathogenic E coli (EPEC) NOT DETECTED NOT DETECTED Final   Enterotoxigenic E coli (ETEC) NOT DETECTED NOT DETECTED Final   Shiga like toxin  producing E coli (STEC) NOT DETECTED NOT DETECTED Final   Shigella/Enteroinvasive E coli (EIEC) NOT DETECTED NOT DETECTED Final   Cryptosporidium NOT DETECTED NOT DETECTED Final   Cyclospora cayetanensis NOT DETECTED NOT DETECTED Final   Entamoeba histolytica NOT DETECTED NOT DETECTED Final   Giardia lamblia NOT DETECTED  NOT DETECTED Final   Adenovirus F40/41 NOT DETECTED NOT DETECTED Final   Astrovirus NOT DETECTED NOT DETECTED Final   Norovirus GI/GII NOT DETECTED NOT DETECTED Final   Rotavirus A NOT DETECTED NOT DETECTED Final   Sapovirus (I, II, IV, and V) NOT DETECTED NOT DETECTED Final    Comment: Performed at [facility], [address], [place], [place] [address]  C Difficile Quick Screen w PCR reflex     Status: Abnormal   Collection Time: 02/19/20  7:50 PM   Specimen: STOOL  Result Value Ref Range Status   C Diff antigen NEGATIVE NEGATIVE Final   C Diff toxin POSITIVE (A) NEGATIVE Final   C Diff interpretation Results are indeterminate. See PCR results.  Final  C. Diff by PCR, Reflexed     Status: Abnormal   Collection Time: 02/19/20  7:50 PM  Result Value Ref Range Status   Toxigenic C. Difficile by PCR POSITIVE (A) NEGATIVE Final    Comment: Positive for toxigenic C. difficile with little to no toxin production. Only treat if clinical presentation suggests symptomatic illness. Performed at [facility], [place] [address] [place], [place] [address]   MRSA PCR Screening     Status: None   Collection Time: 02/20/20  1:33 AM   Specimen: Nasal Mucosa; Nasopharyngeal  Result Value Ref Range Status   MRSA by PCR NEGATIVE NEGATIVE Final    Comment:        The GeneXpert MRSA Assay (FDA approved for NASAL specimens only), is one component of a comprehensive MRSA colonization surveillance program. It is not intended to diagnose MRSA infection nor to guide or monitor treatment for MRSA infections. Performed at [facility], [place] [address] [place], [place] [address]   Urine Culture     Status: Abnormal   Collection Time: 02/22/20  8:05 AM   Specimen: Urine, Clean Catch  Result Value Ref Range Status   Specimen Description   Final    URINE, CLEAN CATCH Performed at [facility], [place] [address], [place], [place] [address]    Special Requests   Final    NONE Performed at [facility], [place] [address] [place], [place] [address]    Culture (A)  Final    >=100,000 COLONIES/mL MULTIPLE SPECIES PRESENT, SUGGEST RECOLLECTION   Report Status 02/24/2020 FINAL  Final  Culture, blood (routine x 2)     Status: None (Preliminary result)   Collection Time: 02/22/20  8:37 AM   Specimen: BLOOD  Result Value Ref Range Status   Specimen Description   Final    BLOOD RIGHT ARM Performed at [place] [address] [place], [place] [address]    Special Requests   Final    BOTTLES DRAWN AEROBIC AND ANAEROBIC Blood Culture adequate volume Performed at [place] [address] [place], [place] [address]    Culture   Final    NO GROWTH 3 DAYS Performed at [facility], [place] [address] [place], [place] [address]    Report Status PENDING  Incomplete  Culture, blood (routine x 2)     Status: None (Preliminary result)   Collection Time: 02/22/20  8:37 AM   Specimen: BLOOD  Result Value Ref Range Status   Specimen Description   Final    BLOOD RIGHT ARM Performed at [place] [address] [place], [place] [address]    Special Requests  Final    BOTTLES DRAWN AEROBIC AND ANAEROBIC Blood Culture adequate volume Performed at [place] [address], [place], [place] [address]    Culture   Final    NO GROWTH 3 DAYS Performed at [facility], [place] [address], [place], [place] [address]    Report Status PENDING  Incomplete      Studies: No results found.  Scheduled Meds: . allopurinol   300 mg Oral Daily  . aspirin EC  81 mg Oral Daily  . Chlorhexidine Gluconate Cloth  6 each Topical Daily  . enoxaparin (LOVENOX) injection  40 mg Subcutaneous Q24H  . fluticasone  1 spray Each Nare Daily  . gabapentin  600 mg Oral q morning - 10a   And  . gabapentin  300 mg Oral Q1500  . metoprolol tartrate  12.5 mg Oral BID  . rosuvastatin  20 mg Oral QPM  . saccharomyces boulardii  250 mg Oral BID  . sodium chloride flush  10-40 mL Intracatheter Q12H  . vancomycin  125 mg Oral QID   Followed by  . [START ON 03/04/2020] vancomycin  125 mg Oral BID   Followed by  . [START ON 03/12/2020] vancomycin  125 mg Oral Daily   Followed by  . [START ON 03/19/2020] vancomycin  125 mg Oral QODAY   Followed by  . [START ON 03/27/2020] vancomycin  125 mg Oral Q3 days    Continuous Infusions:    LOS: 6 days     Alma Friendly, MD [facility]  If 7PM-7AM, please contact night-coverage [email or web address] 02/25/2020, 2:12 PM

## 2020-02-26 MED ORDER — METOPROLOL TARTRATE 25 MG PO TABS: 12.5000 mg | ORAL_TABLET | Freq: Two times a day (BID) | ORAL | 3 refills | Status: DC

## 2020-02-26 MED ORDER — SIMETHICONE 80 MG PO CHEW: 80.0000 mg | CHEWABLE_TABLET | Freq: Four times a day (QID) | ORAL | 0 refills | Status: DC | PRN

## 2020-02-26 MED ORDER — HEPARIN SOD (PORK) LOCK FLUSH 100 UNIT/ML IV SOLN
500.0000 [IU] | INTRAVENOUS | Status: AC | PRN
  Administered 2020-02-26: 500 [IU]
  Filled 2020-02-26: qty 5

## 2020-02-26 MED ORDER — VANCOMYCIN HCL 125 MG PO CAPS: 125.0000 mg | ORAL_CAPSULE | ORAL | 0 refills | Status: DC

## 2020-02-26 MED ORDER — SACCHAROMYCES BOULARDII 250 MG PO CAPS: 250.0000 mg | ORAL_CAPSULE | Freq: Two times a day (BID) | ORAL | 0 refills | Status: AC

## 2020-02-26 NOTE — Progress Notes (Signed)
PT Cancellation Note  Patient Details Name: Brandon Haynes MRN: [id number] DOB: March 15, 1941   Cancelled Treatment:    Reason Eval/Treat Not Completed: Attempted PT tx session. Pt politely declined participation. He stated he is set to d/c home sometime today. He also reported that he walked the entire unit on yesterday evening and he feels he is mobilizing well.   Doreatha Massed, PT Acute Rehabilitation

## 2020-02-26 NOTE — Discharge Summary (Signed)
Discharge Summary  Brandon Haynes [id number] DOB: 1941/09/12  PCP: Tammi Sou, MD  Admit date: 02/19/2020 Discharge date: 02/26/2020  Time spent: 40 mins  Recommendations for Outpatient Follow-up:  1. PCP in 1 week, to repeat labs as well as blood pressure checks as BP medications have been held due to patient's blood pressure being soft  Discharge Diagnoses:  Active Hospital Problems   Diagnosis Date Noted   Sepsis [facility]) 02/20/2020   Colitis 02/19/2020   Essential hypertension 03/29/2008    Resolved Hospital Problems  No resolved problems to display.    Discharge Condition: Stable  Diet recommendation: As tolerated  Vitals:   02/26/20 0917 02/26/20 0921  BP: 118/67 118/67  Pulse: 74 77  Resp:  14  Temp:    SpO2:  97%    History of present illness:  Brandon Haynes a 79 y.o.malewithhistory of CAD, bladder cancer status post ileal conduit, hypertension has been experiencing watery diarrhea multiple episodes over the last 1 week. Given the symptoms patient presented to the ER at [facility]. Reportedassociated crampy diffuse abdominal discomfort. Patient was recently treated for C. difficile colitis in April for which patient took 10 days of vancomycin oral. Denies taking any other antibiotics. Denies any sick contacts or recent travel.In the ER patient was hypotensive was given fluid bolus following which patient responded. CT angiogram of the chest and CT abdomen and pelvis was done, negative for pulmonary embolism. CT abdomen pelvis shows diffuse colitis. Labs show WBC count of 29.6,Creatinine increased from baseline of around normal to 1.5. Urine shows WBC and large leukocyte esterase. C. difficile came back positive. Patient started on oral vancomycin admitted for sepsis secondary to C. difficile colitis.    Today, patient reports having 1 formed stool, denies any abdominal pain, nausea/vomiting, chest pain, SOB, nausea/vomiting,  fever/chills.  Patient advised to have strict hygiene, which includes daily baths, washing hands very frequently, if possible using a different bathroom from wife or other family members.  Patient advised to follow-up with PCP in 1 week with repeat labs and blood pressure checks.  Patient also advised to stay hydrated.    Hospital Course:  Principal Problem:   Sepsis ([place]) Active Problems:   Essential hypertension   Colitis   Sepsis likely 2/2 C. difficile colitis Improved Tachycardic, hypotensive with leukocytosis on admission Currently afebrile, resolved leukocytosis LA WNL C. difficile stool came back positive CT abdomen pelvis shows diffuse colitis Procalcitonin 0.18-->0.20--> <0.10 BC x2 NGTD Probiotics, S/P IV fluids Continue tapered dose of p.o. vancomycin upon discharge Follow-up with PCP, with repeat labs.  AKI Resolved Likely from above  Hypokalemia/Hypocalcemia Replace prn  Hypertension BP improving Encourage oral hydration Held home BP meds, held amlodipine, losartan, reduced dose of metoprolol to 12.5 mg  History of CAD  Currently chest pain-free Continue aspirin, statin, reduced metoprolol  History of bladder cancer status post chemotherapy and ileal conduit Outpatient oncology          Malnutrition Type:      Malnutrition Characteristics:      Nutrition Interventions:      Estimated body mass index is 26.32 kg/m as calculated from the following:   Height as of this encounter: 6\' 2"  (1.88 m).   Weight as of this encounter: 93 kg.    Procedures: None  Consultations:  None  Discharge Exam: BP 118/67 (BP Location: Left Arm)    Pulse 77    Temp 98.2 F (36.8 C) (Oral)    Resp  14    Ht 6\' 2"  (1.88 m)    Wt 93 kg    SpO2 97%    BMI 26.32 kg/m   General: NAD Cardiovascular: S1, S2 present Respiratory: CTA B Abdomen: Soft, NT, ND, BS present    Discharge Instructions You were cared for by a hospitalist during your  hospital stay. If you have any questions about your discharge medications or the care you received while you were in the hospital after you are discharged, you can call the unit and asked to speak with the hospitalist on call if the hospitalist that took care of you is not available. Once you are discharged, your primary care physician will handle any further medical issues. Please note that NO REFILLS for any discharge medications will be authorized once you are discharged, as it is imperative that you return to your primary care physician (or establish a relationship with a primary care physician if you do not have one) for your aftercare needs so that they can reassess your need for medications and monitor your lab values.  Discharge Instructions    Diet - low sodium heart healthy   Complete by: As directed    Increase activity slowly   Complete by: As directed      Allergies as of 02/26/2020   No Known Allergies     Medication List    STOP taking these medications   amLODipine 5 MG tablet Commonly known as: NORVASC   losartan 50 MG tablet Commonly known as: COZAAR   pantoprazole 40 MG tablet Commonly known as: PROTONIX     TAKE these medications   albuterol 108 (90 Base) MCG/ACT inhaler Commonly known as: VENTOLIN HFA Inhale 1-2 puffs into the lungs daily as needed for wheezing or shortness of breath.   allopurinol 300 MG tablet Commonly known as: ZYLOPRIM TAKE 1 TABLET BY MOUTH EVERY DAY   aspirin EC 81 MG tablet Take 81 mg by mouth daily.   desonide 0.05 % cream Commonly known as: DESOWEN Apply 1 application topically 3 (three) times a week.   diphenhydrAMINE 25 MG tablet Commonly known as: BENADRYL Take 50 mg by mouth 3 (three) times daily as needed for itching or allergies.   EPINEPHrine 0.3 mg/0.3 mL Soaj injection Commonly known as: EPI-PEN Inject 0.3 mg into the muscle as needed for anaphylaxis.   fluticasone 50 MCG/ACT nasal spray Commonly known as:  FLONASE Place 1 spray into both nostrils daily.   gabapentin 300 MG capsule Commonly known as: NEURONTIN Take 300-600 mg by mouth See admin instructions. Take 2 capsules (600 mg) by mouth in the morning & take 1 capsule (300 mg) by mouth in the afternoon.   loratadine 10 MG tablet Commonly known as: CLARITIN Take 20 mg by mouth daily.   metoprolol tartrate 25 MG tablet Commonly known as: LOPRESSOR Take 0.5 tablets (12.5 mg total) by mouth 2 (two) times daily. What changed: how much to take   nitroGLYCERIN 0.4 MG SL tablet Commonly known as: NITROSTAT PLACE 1 TABLET UNDER TONGUE EVERY 5 MINS, UP TO 3 DOSES AS NEEDED FOR CHEST PAIN What changed: See the new instructions.   PreviDent 5000 Booster Plus 1.1 % Pste Generic drug: Sodium Fluoride Take 1 application by mouth at bedtime.   rosuvastatin 20 MG tablet Commonly known as: CRESTOR TAKE 1 TABLET BY MOUTH EVERY DAY What changed: when to take this   saccharomyces boulardii 250 MG capsule Commonly known as: FLORASTOR Take 1 capsule (250 mg total) by  mouth 2 (two) times daily.   simethicone 80 MG chewable tablet Commonly known as: MYLICON Chew 1 tablet (80 mg total) by mouth every 6 (six) hours as needed for flatulence.   traMADol 50 MG tablet Commonly known as: ULTRAM Take 50 mg by mouth 3 (three) times daily as needed for moderate pain.   vancomycin 125 MG capsule Commonly known as: Vancocin HCl Take 1 capsule (125 mg total) by mouth as directed. Take 1 cap QID x 8 days then 1 cap BID x 7 days, then 1 cap daily x 7 days, then 1 cap every other day for 4 doses, then 1 cap every 3 days x 3 doses      No Known Allergies Follow-up Information    McGowen, [name], MD. Schedule an appointment as soon as possible for a visit in 1 week(s).   Specialty: Family Medicine Contact information: [id number] [place] [address] [phone]            The results of significant diagnostics from this  hospitalization (including imaging, microbiology, ancillary and laboratory) are listed below for reference.    Significant Diagnostic Studies: CT Angio Chest PE W and/or Wo Contrast  Result Date: 02/19/2020 CLINICAL DATA:  Weakness. EXAM: CT ANGIOGRAPHY CHEST WITH CONTRAST TECHNIQUE: Multidetector CT imaging of the chest was performed using the standard protocol during bolus administration of intravenous contrast. Multiplanar CT image reconstructions and MIPs were obtained to evaluate the vascular anatomy. CONTRAST:  185mL OMNIPAQUE IOHEXOL 350 MG/ML SOLN COMPARISON:  June 02, 2005 FINDINGS: Cardiovascular: A right-sided venous Port-A-Cath is in place. There is moderate severity calcification of the thoracic aorta. Satisfactory opacification of the pulmonary arteries to the segmental level. No evidence of pulmonary embolism. Normal heart size. No pericardial effusion. Marked severity coronary artery calcification is seen. Mediastinum/Nodes: No enlarged mediastinal, hilar, or axillary lymph nodes. Thyroid gland, trachea, and esophagus demonstrate no significant findings. Lungs/Pleura: Lungs are clear. No pleural effusion or pneumothorax. Upper Abdomen: A moderate amount of free fluid is seen. Marked severity diffuse colonic wall thickening is present. Musculoskeletal: No chest wall abnormality. No acute or significant osseous findings. Review of the MIP images confirms the above findings. IMPRESSION: 1. No evidence of pulmonary embolus or acute cardiopulmonary disease. 2. Marked severity diffuse colonic wall thickening, consistent with colitis. 3. Moderate amount of free fluid in the abdomen. 4. Marked severity coronary artery calcification. Aortic Atherosclerosis (ICD10-I70.0). Electronically Signed   By: Virgina Norfolk M.D.   On: 02/19/2020 16:57   CT ABDOMEN PELVIS W CONTRAST  Result Date: 02/19/2020 CLINICAL DATA:  Weakness. EXAM: CT ABDOMEN AND PELVIS WITH CONTRAST TECHNIQUE: Multidetector CT  imaging of the abdomen and pelvis was performed using the standard protocol following bolus administration of intravenous contrast. CONTRAST:  170mL OMNIPAQUE IOHEXOL 350 MG/ML SOLN COMPARISON:  None. FINDINGS: Lower chest: No acute abnormality. Hepatobiliary: No focal liver abnormality is seen. No gallstones, gallbladder wall thickening, or biliary dilatation. Pancreas: Unremarkable. No pancreatic ductal dilatation or surrounding inflammatory changes. Spleen: Normal in size without focal abnormality. Adrenals/Urinary Tract: Adrenal glands are unremarkable. Kidneys are normal, without renal calculi, focal lesion, or hydronephrosis. The urinary bladder is absent. Stomach/Bowel: Stomach is within normal limits. Surgically anastomosed bowel is seen within the right hemipelvis. There is no evidence of bowel dilatation. Marked severity diffuse colonic wall thickening is seen. This involves all portions of the large bowel. A moderate amount of pericolonic inflammatory fat stranding is present. Vascular/Lymphatic: Moderate to marked severity calcification of  the abdominal aorta is seen without evidence of aneurysmal dilatation or dissection. No enlarged abdominal or pelvic lymph nodes. Reproductive: The prostate gland is absent. Other: A right lower quadrant ostomy site is seen. A moderate amount of free fluid is seen within the abdomen. A mild amount of pelvic free fluid is also noted. No intra-abdominal free air is identified. Musculoskeletal: Multilevel degenerative changes are seen throughout the lumbar spine. IMPRESSION: 1. Marked severity diffuse colonic wall thickening, consistent with an infectious or inflammatory process. 2. Moderate amount of free fluid within the abdomen and pelvis. Aortic Atherosclerosis (ICD10-I70.0). Electronically Signed   By: Virgina Norfolk M.D.   On: 02/19/2020 17:02   DG Chest Port 1 View  Result Date: 02/19/2020 CLINICAL DATA:  Shortness of breath x2 days, recent C diff EXAM:  PORTABLE CHEST 1 VIEW COMPARISON:  01/08/2020 FINDINGS: Lungs are clear.  No pleural effusion or pneumothorax. Heart is normal in size.  Thoracic aortic atherosclerosis. Right chest port terminates in the upper right atrium. IMPRESSION: No evidence of acute cardiopulmonary disease. Electronically Signed   By: Julian Hy M.D.   On: 02/19/2020 16:00    Microbiology: Recent Results (from the past 240 hour(s))  Blood Culture (routine x 2)     Status: None   Collection Time: 02/19/20  3:45 PM   Specimen: BLOOD  Result Value Ref Range Status   Specimen Description BLOOD CHEST PORTA CATH  Final   Special Requests   Final    BOTTLES DRAWN AEROBIC AND ANAEROBIC Blood Culture adequate volume   Culture   Final    NO GROWTH 5 DAYS Performed at [facility], [address], [place], [place] [address]    Report Status 02/24/2020 FINAL  Final  SARS Coronavirus 2 by RT PCR (hospital order, performed in [facility] hospital lab) Nasopharyngeal Nasopharyngeal Swab     Status: None   Collection Time: 02/19/20  3:47 PM   Specimen: Nasopharyngeal Swab  Result Value Ref Range Status   SARS Coronavirus 2 NEGATIVE NEGATIVE Final    Comment: (NOTE) SARS-CoV-2 target nucleic acids are NOT DETECTED. The SARS-CoV-2 RNA is generally detectable in upper and lower respiratory specimens during the acute phase of infection. The lowest concentration of SARS-CoV-2 viral copies this assay can detect is 250 copies / mL. A negative result does not preclude SARS-CoV-2 infection and should not be used as the sole basis for treatment or other patient management decisions.  A negative result may occur with improper specimen collection / handling, submission of specimen other than nasopharyngeal swab, presence of viral mutation(s) within the areas targeted by this assay, and inadequate number of viral copies (<250 copies / mL). A negative result must be combined with clinical observations, patient history, and  epidemiological information. Fact Sheet for Patients:   StrictlyIdeas.no Fact Sheet for Healthcare Providers: BankingDealers.co.za This test is not yet approved or cleared  by the [place] FDA and has been authorized for detection and/or diagnosis of SARS-CoV-2 by FDA under an Emergency Use Authorization (EUA).  This EUA will remain in effect (meaning this test can be used) for the duration of the COVID-19 declaration under Section 564(b)(1) of the Act, 21 U.S.C. section 360bbb-3(b)(1), unless the authorization is terminated or revoked sooner. Performed at [facility], [address], [place], [place] [address]   Urine culture     Status: Abnormal   Collection Time: 02/19/20  3:48 PM   Specimen: In/Out Cath Urine  Result Value Ref  Range Status   Specimen Description   Final    IN/OUT CATH URINE Performed at [facility], [address] [place], [place] [address]    Special Requests   Final    NONE Performed at [facility], [address] [place], [place] [address]    Culture MULTIPLE SPECIES PRESENT, SUGGEST RECOLLECTION (A)  Final   Report Status 02/20/2020 FINAL  Final  Gastrointestinal Panel by PCR , Stool     Status: None   Collection Time: 02/19/20  6:50 PM   Specimen: STOOL  Result Value Ref Range Status   Campylobacter species NOT DETECTED NOT DETECTED Final   Plesimonas shigelloides NOT DETECTED NOT DETECTED Final   Salmonella species NOT DETECTED NOT DETECTED Final   Yersinia enterocolitica NOT DETECTED NOT DETECTED Final   Vibrio species NOT DETECTED NOT DETECTED Final   Vibrio cholerae NOT DETECTED NOT DETECTED Final   Enteroaggregative E coli (EAEC) NOT DETECTED NOT DETECTED Final   Enteropathogenic E coli (EPEC) NOT DETECTED NOT DETECTED Final   Enterotoxigenic E coli (ETEC) NOT DETECTED NOT DETECTED Final   Shiga like toxin producing E coli (STEC) NOT DETECTED NOT DETECTED  Final   Shigella/Enteroinvasive E coli (EIEC) NOT DETECTED NOT DETECTED Final   Cryptosporidium NOT DETECTED NOT DETECTED Final   Cyclospora cayetanensis NOT DETECTED NOT DETECTED Final   Entamoeba histolytica NOT DETECTED NOT DETECTED Final   Giardia lamblia NOT DETECTED NOT DETECTED Final   Adenovirus F40/41 NOT DETECTED NOT DETECTED Final   Astrovirus NOT DETECTED NOT DETECTED Final   Norovirus GI/GII NOT DETECTED NOT DETECTED Final   Rotavirus A NOT DETECTED NOT DETECTED Final   Sapovirus (I, II, IV, and V) NOT DETECTED NOT DETECTED Final    Comment: Performed at [facility], [address] [place], [place] [address]  C Difficile Quick Screen w PCR reflex     Status: Abnormal   Collection Time: 02/19/20  7:50 PM   Specimen: STOOL  Result Value Ref Range Status   C Diff antigen NEGATIVE NEGATIVE Final   C Diff toxin POSITIVE (A) NEGATIVE Final   C Diff interpretation Results are indeterminate. See PCR results.  Final  C. Diff by PCR, Reflexed     Status: Abnormal   Collection Time: 02/19/20  7:50 PM  Result Value Ref Range Status   Toxigenic C. Difficile by PCR POSITIVE (A) NEGATIVE Final    Comment: Positive for toxigenic C. difficile with little to no toxin production. Only treat if clinical presentation suggests symptomatic illness. Performed at [facility], [place] [address] [place], [place] [address]   MRSA PCR Screening     Status: None   Collection Time: 02/20/20  1:33 AM   Specimen: Nasal Mucosa; Nasopharyngeal  Result Value Ref Range Status   MRSA by PCR NEGATIVE NEGATIVE Final    Comment:        The GeneXpert MRSA Assay (FDA approved for NASAL specimens only), is one component of a comprehensive MRSA colonization surveillance program. It is not intended to diagnose MRSA infection nor to guide or monitor treatment for MRSA infections. Performed at [facility], [place] [address], [place], [place] [address]   Urine Culture      Status: Abnormal   Collection Time: 02/22/20  8:05 AM   Specimen: Urine, Clean Catch  Result Value Ref Range Status   Specimen Description   Final    URINE, CLEAN CATCH Performed at [facility]  [place] [address], [place], [place] [address]    Special Requests   Final    NONE Performed at [facility], [place] [address], [place], [place] [address]    Culture (A)  Final    >=100,000 COLONIES/mL MULTIPLE SPECIES PRESENT, SUGGEST RECOLLECTION   Report Status 02/24/2020 FINAL  Final  Culture, blood (routine x 2)     Status: None (Preliminary result)   Collection Time: 02/22/20  8:37 AM   Specimen: BLOOD  Result Value Ref Range Status   Specimen Description   Final    BLOOD RIGHT ARM Performed at [place] [address], [place], [place] [address]    Special Requests   Final    BOTTLES DRAWN AEROBIC AND ANAEROBIC Blood Culture adequate volume Performed at [place] [address] [place], [place] [address]    Culture   Final    NO GROWTH 4 DAYS Performed at [facility], [place] [address], [place], [place] [address]    Report Status PENDING  Incomplete  Culture, blood (routine x 2)     Status: None (Preliminary result)   Collection Time: 02/22/20  8:37 AM   Specimen: BLOOD  Result Value Ref Range Status   Specimen Description   Final    BLOOD RIGHT ARM Performed at [place] [address], [place], [place] [address]    Special Requests   Final    BOTTLES DRAWN AEROBIC AND ANAEROBIC Blood Culture adequate volume Performed at [place] [address] [place], [place] [address]    Culture   Final    NO GROWTH 4 DAYS Performed at [facility], [place] [address], [place],  [address]    Report Status PENDING  Incomplete     Labs: Basic Metabolic Panel: Recent Labs  Lab 02/21/20 0500 02/22/20 0322 02/23/20 0323 02/24/20 0506  02/25/20 0400  NA 133* 133* 133* 133* 137  K 4.1 3.9 3.7 3.0* 3.9  CL 104 102 104 110 109  CO2 22 20* 22 18* 22  GLUCOSE 114* 111* 105* 72 101*  BUN 22 22 21 16 15   CREATININE 1.08 1.02 1.05 0.74 0.92  CALCIUM 7.8* 7.7* 7.8* 6.3* 7.7*  MG 1.9  --   --   --   --   PHOS 4.0  --   --   --   --    Liver Function Tests: Recent Labs  Lab 02/21/20 0500 02/22/20 0322 02/23/20 0323 02/24/20 0506 02/25/20 0400  AST 11* 24 65* 39 59*  ALT 8 13 34 27 39  ALKPHOS 50 61 58 45 54  BILITOT 0.5 0.3 0.1* 0.4 0.6  PROT 4.4* 4.7* 4.7* 3.6* 4.4*  ALBUMIN 2.0* 2.1* 2.2* 1.6* 2.1*   No results for input(s): LIPASE, AMYLASE in the last 168 hours. No results for input(s): AMMONIA in the last 168 hours. CBC: Recent Labs  Lab 02/21/20 0500 02/22/20 0322 02/23/20 0323 02/24/20 0506 02/25/20 0400  WBC 26.5* 26.4* 16.8* 12.3* 8.9  NEUTROABS 20.7* 19.0* 11.6* 8.0* 5.2  HGB 12.2* 12.4* 12.0* 11.1* 10.8*  HCT 38.7* 40.0 38.0* 35.1* 34.0*  MCV 91.7 91.3 90.0 90.2 91.4  PLT 221 266 264 272 281   Cardiac Enzymes: No results for input(s): CKTOTAL, CKMB, CKMBINDEX, TROPONINI in the last 168 hours. BNP: BNP (last 3 results) No results for input(s): BNP in the last 8760 hours.  ProBNP (last 3 results) No  results for input(s): PROBNP in the last 8760 hours.  CBG: Recent Labs  Lab 02/19/20 1523  GLUCAP 183*       Signed:  Alma Friendly, MD [facility] 02/26/2020, 10:34 AM

## 2020-02-27 LAB — CULTURE, BLOOD (ROUTINE X 2)
Culture: NO GROWTH
Culture: NO GROWTH
Special Requests: ADEQUATE
Special Requests: ADEQUATE

## 2020-02-28 ENCOUNTER — Telehealth: Payer: Self-pay

## 2020-02-28 NOTE — Telephone Encounter (Signed)
Transition Care Management Follow-up Telephone Call  Admission: 02/19/2020-02/26/2020   How have you been since you were released from the hospital? "weak"   Do you understand why you were in the hospital? yes   Do you understand the discharge instructions? yes   Where were you discharged to? Home, resides with wife.    Items Reviewed:  Medications reviewed: yes  Allergies reviewed: yes  Dietary changes reviewed: yes  Referrals reviewed: yes   Functional Questionnaire:   Activities of Daily Living (ADLs):   He states they are independent in the following: ambulation, bathing and hygiene, feeding, continence, grooming, toileting and dressing States they require assistance with the following: None.    Any transportation issues/concerns?: no   Any patient concerns? no   Confirmed importance and date/time of follow-up visits scheduled yes  Provider Appointment booked with PCP 03/04/2020, in person.   Confirmed with patient if condition begins to worsen call PCP or go to the ER.  Patient was given the office number and encouraged to call back with question or concerns.  : yes

## 2020-02-28 NOTE — Telephone Encounter (Signed)
Noted: nurse phone contact with patient for TCM. Signed:  Crissie Sickles, MD           02/28/2020

## 2020-03-01 ENCOUNTER — Other Ambulatory Visit: Payer: Self-pay | Admitting: Family Medicine

## 2020-03-03 ENCOUNTER — Encounter: Payer: Self-pay | Admitting: Family Medicine

## 2020-03-04 ENCOUNTER — Encounter: Payer: Self-pay | Admitting: Family Medicine

## 2020-03-04 ENCOUNTER — Other Ambulatory Visit: Payer: Self-pay

## 2020-03-04 ENCOUNTER — Ambulatory Visit: Payer: Medicare HMO | Admitting: Family Medicine

## 2020-03-04 VITALS — BP 106/68 | HR 64 | Temp 98.2°F | Resp 16 | Ht 74.0 in | Wt 210.4 lb

## 2020-03-04 DIAGNOSIS — R7989 Other specified abnormal findings of blood chemistry: Secondary | ICD-10-CM | POA: Diagnosis not present

## 2020-03-04 DIAGNOSIS — D72829 Elevated white blood cell count, unspecified: Secondary | ICD-10-CM | POA: Diagnosis not present

## 2020-03-04 DIAGNOSIS — A0471 Enterocolitis due to Clostridium difficile, recurrent: Secondary | ICD-10-CM | POA: Diagnosis not present

## 2020-03-04 DIAGNOSIS — E860 Dehydration: Secondary | ICD-10-CM | POA: Diagnosis not present

## 2020-03-04 MED ORDER — LOSARTAN POTASSIUM 50 MG PO TABS
50.0000 mg | ORAL_TABLET | Freq: Every day | ORAL | 3 refills | Status: DC
Start: 2020-03-04 — End: 2020-03-06

## 2020-03-04 NOTE — Progress Notes (Signed)
03/04/2020  CC:  Chief Complaint  Patient presents with  . Hospitalization Follow-up    Patient is a 79 y.o. Caucasian male who presents for  hospital follow up, specifically [facility] face-to-face visit. Dates hospitalized: 5/24-5/31, 2021. Days since d/c from hospital: 7 days Patient was discharged from hospital to home. Reason for admission to hospital: recurrent C diff colitis, dehydration, sepsis. Date of interactive (phone) contact with patient and/or caregiver: 02/28/20.  I have reviewed patient's discharge summary plus pertinent specific notes, labs, and imaging from the hospitalization.   Admitted w/hypotension that responded to IVF, CT abd/pelv showed diffuse colitis, o/w normal.  CT chest angio showed no PE.  WBC up to 29K, sCr up to 1.5.  Abnormal UA. Pt started on oral vancomycin and gradually improved. Upon d/c he was told to hold amlodipine, losartan, and pantoprazole.  All other chronic meds continued plus oral vanco 1 mo taper to be continued.    1-2 solid BMs per day since home from hosp. Avg bp at home since d/c 130/80.  He had bp 146/89 a few days ago so he restarted his losartan back at reg dose 25 bid and lopressor 89m bid.  Has not taken amlod in about 1 mo.  No dizziness, no palpitations, no increase in fatigue. Tolerating vanco well.  His RLQ urinary ostomy site looks and feels good, urine clear and yellow.   Medication reconciliation was done today and patient is taking meds as recommended by discharging hospitalist/specialist except he restarted his losartan and lopressor.    PMH:  Past Medical History:  Diagnosis Date  . Allergic rhinitis    allergy shots via [place]  . Anxiety   . Bladder cancer ([place]) 05/2019   INVASIVE HIGH GRADE PAPILLARY UROTHELIAL CARCINOMA, invading muscularis propia.  Lesion resected. No mets.  He got neoadjuvant chemo x 4 cycles.  Cystoprostectomy with ileal conduit formation 12/13/19 (Dr. Tresa Moore).  . 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 C. difficile colitis 01/09/2020; late May 2021   oral vancomycin  . CAD, multiple vessel    a. CP/near-syncope 11/2014 s/p DES to MLAD, occluded RCA after takeoff of RV marginal with collaterals treated medically, otherwise mild nonobstructive disease. EF 55%;  b. 07/2015 Lexiscan CL: EF 56%, no ischemia/infarct.   . Cataracts, both eyes   . Chronic low back pain   . DDD (degenerative disc disease), lumbar   . GERD (gastroesophageal reflux disease)    PPI bid needed to control sx's  . Gout   . Hearing loss   . History  of basal cell carcinoma   . History of adenomatous polyp of colon    Rpt colonoscopy recommended 02/2020->no further if no high risk lesions at that time.  . Hypercholesteremia   . Hypertension    Hx labile/uncontrolled.  Improved s/p visit to HTN clinic 2019.  . Iron deficiency anemia 2017   Colonoscopy, EGD, and capsule endoscopy w/out obvious cause/source found.  Oral iron helping as of 03/2016.  . Leg pain 03/06/2016  . Nonmelanoma skin cancer   . 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 Peripheral vascular disease ([place])   . Pleural thickening    chronic  . 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 No avid enhancement or mass effect. Obs by urol as of 10/2019.    PSH:  Past Surgical History:  Procedure Laterality Date  . back injection  oct 2011, 07/2010, 12/2012   no  surgery  . Capsule endoscopy  02/2016   mild duodenitis, o/w NEG  . CARDIOVASCULAR STRESS TEST  05/2009; 07/2015  . carotid dopplers  11/2014   Bilateral - 1% to 9% ICA stenosis. Vertebral artery flow is  . COLONOSCOPY  08/2008; 02/2016; 02/2017   2017: 12 polyps (adenomatous).  02/2017 repeat TCS--multiple polyps: recall 3 yrs.  . CORONARY ANGIOPLASTY WITH STENT PLACEMENT  12/04/2014   4.0 x 12 mm Synergy stent to the mid LAD  . CYSTOSCOPY  05/24/2019  . CYSTOSCOPY W/ TRANSURETHRAL RESECTION OF POSTERIOR URETHERAL  VALVES  06/12/2019   2-5cm  . CYSTOSCOPY WITH INJECTION N/A 12/13/2019   Procedure: CYSTOSCOPY WITH INJECTION;  Surgeon: [name], MD;  Location: [facility];  Service: Urology;  Laterality: N/A;  . ESOPHAGOGASTRODUODENOSCOPY  02/2016   Gastritis.  NEG h pylori.    . IR IMAGING GUIDED PORT INSERTION  09/12/2019  . LE arterial vascular study  02/2016   Per Dr. Radford Pax, no evidence of peripheral vascular disease  . LEFT HEART CATHETERIZATION WITH CORONARY ANGIOGRAM N/A 12/04/2014   Procedure: LEFT HEART CATHETERIZATION WITH CORONARY ANGIOGRAM;  Surgeon: Jolaine Artist, MD; LAD 99% then aneurysmal, OM1 40%, CFX 50%, RCA 40%, 50% then occluded  . LYMPHADENECTOMY Bilateral 12/13/2019   Procedure: LYMPHADENECTOMY;  Surgeon: [name], MD;  Location: [facility];  Service: Urology;  Laterality: Bilateral;  . ROBOT ASSISTED LAPAROSCOPIC COMPLETE CYSTECT ILEAL CONDUIT N/A 12/13/2019   Procedure: XI ROBOTIC ASSISTED LAPAROSCOPIC COMPLETE CYSTECT ILEAL CONDUIT AND RADICAL PROSTATECTOMY;  Surgeon: [name], MD;  Location: [facility];  Service: Urology;  Laterality: N/A;  6 HRS  . ROTATOR CUFF REPAIR Left 09/2013   left  . SHOULDER INJECTION  feb 2014  . TRANSTHORACIC ECHOCARDIOGRAM  11/2014   EF 50-55%, some wall motion abnormalities noted, grade I diast dysfxn  . TRANSURETHRAL RESECTION OF BLADDER TUMOR N/A 06/12/2019   INVASIVE HIGH GRADE PAPILLARY UROTHELIAL CARCINOMA, invading muscularis propria.  Procedure: TRANSURETHRAL RESECTION OF BLADDER TUMOR (TURBT);  Surgeon: Lucas Mallow, MD;  Location: [facility];  Service: Urology;  Laterality: N/A;    MEDS:  Outpatient Medications Prior to Visit  Medication Sig Dispense Refill  . allopurinol (ZYLOPRIM) 300 MG tablet TAKE 1 TABLET BY MOUTH EVERY DAY 90 tablet 0  . aspirin EC 81 MG tablet Take 81 mg by mouth daily.    Marland Kitchen desonide (DESOWEN) 0.05 % cream Apply 1 application topically 3 (three) times a week.   1  . diphenhydrAMINE (BENADRYL) 25 MG tablet  Take 50 mg by mouth 3 (three) times daily as needed for itching or allergies.     . fluticasone (FLONASE) 50 MCG/ACT nasal spray Place 1 spray into both nostrils daily.    Marland Kitchen gabapentin (NEURONTIN) 300 MG capsule Take 300-600 mg by mouth See admin instructions. Take 2 capsules (600 mg) by mouth in the morning & take 1 capsule (300 mg) by mouth in the afternoon.    . loratadine (CLARITIN) 10 MG tablet Take 20 mg by mouth daily.     . metoprolol tartrate (LOPRESSOR) 25 MG tablet Take 0.5 tablets (12.5 mg total) by mouth 2 (two) times daily. (Patient taking differently: Take 25 mg by mouth 2 (two) times daily. ) 180 tablet 3  . PREVIDENT 5000 BOOSTER PLUS 1.1 % PSTE Take 1 application by mouth at bedtime.   1  . rosuvastatin (CRESTOR) 20 MG tablet TAKE 1 TABLET BY MOUTH EVERY DAY 90 tablet 1  . saccharomyces boulardii (FLORASTOR) 250  MG capsule Take 1 capsule (250 mg total) by mouth 2 (two) times daily. 60 capsule 0  . simethicone (MYLICON) 80 MG chewable tablet Chew 1 tablet (80 mg total) by mouth every 6 (six) hours as needed for flatulence. 30 tablet 0  . traMADol (ULTRAM) 50 MG tablet Take 50 mg by mouth 3 (three) times daily as needed for moderate pain.     . vancomycin (VANCOCIN HCL) 125 MG capsule Take 1 capsule (125 mg total) by mouth as directed. Take 1 cap QID x 8 days then 1 cap BID x 7 days, then 1 cap daily x 7 days, then 1 cap every other day for 4 doses, then 1 cap every 3 days x 3 doses 60 capsule 0  . EPINEPHrine 0.3 mg/0.3 mL IJ SOAJ injection Inject 0.3 mg into the muscle as needed for anaphylaxis.     Marland Kitchen nitroGLYCERIN (NITROSTAT) 0.4 MG SL tablet PLACE 1 TABLET UNDER TONGUE EVERY 5 MINS, UP TO 3 DOSES AS NEEDED FOR CHEST PAIN (Patient not taking: No sig reported) 25 tablet 1  . albuterol (VENTOLIN HFA) 108 (90 Base) MCG/ACT inhaler Inhale 1-2 puffs into the lungs daily as needed for wheezing or shortness of breath.      No facility-administered medications prior to visit.    EXAM: Vitals with BMI 03/04/2020 02/26/2020 02/26/2020  Height 6\' 2"  - -  Weight 210 lbs 6 oz - -  BMI 27 - -  Systolic 532 992 426  Diastolic 68 67 67  Pulse 64 77 74   Gen: Alert, well appearing.  Patient is oriented to person, place, time, and situation. AFFECT: pleasant, lucid thought and speech. CV: RRR, no m/r/g.   LUNGS: CTA bilat, nonlabored resps, good aeration in all lung fields. ABD: soft, mild lower/suprapubic TTP midline and RLQ.  Ostomy site c/d/e, no erythema. ND, BS normal.  No hepatospenomegaly or mass.  No bruits. EXT: no clubbing or cyanosis.  3+ bilat LL pitting edema.    Pertinent labs/imaging Lab Results  Component Value Date   TSH 1.946 01/08/2020   Lab Results  Component Value Date   WBC 8.9 02/25/2020   HGB 10.8 (L) 02/25/2020   HCT 34.0 (L) 02/25/2020   MCV 91.4 02/25/2020   PLT 281 02/25/2020   Lab Results  Component Value Date   CREATININE 0.92 02/25/2020   BUN 15 02/25/2020   NA 137 02/25/2020   K 3.9 02/25/2020   CL 109 02/25/2020   CO2 22 02/25/2020   Lab Results  Component Value Date   ALT 39 02/25/2020   AST 59 (H) 02/25/2020   ALKPHOS 54 02/25/2020   BILITOT 0.6 02/25/2020   Lab Results  Component Value Date   CHOL 119 03/13/2019   Lab Results  Component Value Date   HDL 54 03/13/2019   Lab Results  Component Value Date   LDLCALC 44 03/13/2019   Lab Results  Component Value Date   TRIG 106 03/13/2019   Lab Results  Component Value Date   CHOLHDL 2.2 03/13/2019   Lab Results  Component Value Date   PSA 2.13 03/22/2014   PSA 1.77 02/21/2013   PSA 1.90 02/25/2012   Lab Results  Component Value Date   HGBA1C 5.2 04/05/2019   ASSESSMENT/PLAN:  1) Recurrent C diff colitis: resolving approp on oral vanco taper. Continue this and probiotic qd. Make every effort to avoid antibiotic use in future. Leukocytosis in hosp-->recheck CBC today.  2) Acute dehydration with hypotension: resolved in  hosp. BP's back up  so he restarted losartan 50mg  bid and lopressor 25mg  bid appropriately. He has amlod 5mg  at home to restart if needed.  3) AKI in hosp: recheck BMET today. He is hydrating and eating well.  Medical decision making of moderate complexity was utilized today.  FOLLOW UP:  1 mo f/u recurrent C diff  Signed:  Crissie Sickles, MD           03/04/2020

## 2020-03-04 NOTE — Addendum Note (Signed)
Addended by: Ralph Dowdy on: 03/04/2020 11:32 AM   Modules accepted: Orders

## 2020-03-05 ENCOUNTER — Other Ambulatory Visit: Payer: Self-pay | Admitting: Family Medicine

## 2020-03-05 ENCOUNTER — Telehealth: Payer: Self-pay | Admitting: Gastroenterology

## 2020-03-05 ENCOUNTER — Other Ambulatory Visit: Payer: Self-pay | Admitting: Cardiology

## 2020-03-05 NOTE — Telephone Encounter (Signed)
Pt is due for a recall colonoscopy.  He reported that he was diagnosed with C. diff and will be on antibiotics until the end of June.  Pt would like to discuss whether he can wait until antibiotics course is finished.

## 2020-03-05 NOTE — Telephone Encounter (Signed)
Patient is notified that he will need to be off antibiotics and symptom free for several weeks.  He will call back the end of June to schedule colonoscopy screening.

## 2020-03-06 ENCOUNTER — Ambulatory Visit: Payer: Medicare HMO

## 2020-03-07 ENCOUNTER — Other Ambulatory Visit: Payer: Self-pay

## 2020-03-07 ENCOUNTER — Ambulatory Visit (INDEPENDENT_AMBULATORY_CARE_PROVIDER_SITE_OTHER): Payer: Medicare HMO

## 2020-03-07 DIAGNOSIS — A0471 Enterocolitis due to Clostridium difficile, recurrent: Secondary | ICD-10-CM

## 2020-03-07 DIAGNOSIS — D72829 Elevated white blood cell count, unspecified: Secondary | ICD-10-CM

## 2020-03-07 DIAGNOSIS — E860 Dehydration: Secondary | ICD-10-CM

## 2020-03-07 DIAGNOSIS — R7989 Other specified abnormal findings of blood chemistry: Secondary | ICD-10-CM

## 2020-03-07 NOTE — Progress Notes (Signed)
Pt came to get lab work and after several attempts to obtain blood we were unable to obtain specimen. Dr Anitra Lauth was advised and stated he did not need labs today and we could await labs from cancer center that would be drawn from [facility] in a few weeks. Pt agreed with plan.

## 2020-03-14 ENCOUNTER — Other Ambulatory Visit: Payer: Self-pay

## 2020-03-14 ENCOUNTER — Ambulatory Visit: Payer: Medicare HMO | Admitting: Cardiology

## 2020-03-14 ENCOUNTER — Encounter: Payer: Self-pay | Admitting: Cardiology

## 2020-03-14 VITALS — BP 110/70 | HR 57 | Ht 74.0 in | Wt 198.8 lb

## 2020-03-14 DIAGNOSIS — E7800 Pure hypercholesterolemia, unspecified: Secondary | ICD-10-CM | POA: Diagnosis not present

## 2020-03-14 DIAGNOSIS — I2510 Atherosclerotic heart disease of native coronary artery without angina pectoris: Secondary | ICD-10-CM

## 2020-03-14 DIAGNOSIS — I10 Essential (primary) hypertension: Secondary | ICD-10-CM

## 2020-03-14 NOTE — Addendum Note (Signed)
Addended by: Antonieta Iba on: 03/14/2020 11:15 AM   Modules accepted: Orders

## 2020-03-14 NOTE — Patient Instructions (Signed)
Medication Instructions:  Your physician recommends that you continue on your current medications as directed. Please refer to the Current Medication list given to you today.  *If you need a refill on your cardiac medications before your next appointment, please call your pharmacy*  Lab Work: FLP and ALT If you have labs (blood work) drawn today and your tests are completely normal, you will receive your results only by: Marland Kitchen MyChart Message (if you have MyChart) OR . A paper copy in the mail If you have any lab test that is abnormal or we need to change your treatment, we will call you to review the results.  Follow-Up: At [facility], you and your health needs are our priority.  As part of our continuing mission to provide you with exceptional heart care, we have created designated Provider Care Teams.  These Care Teams include your primary Cardiologist (physician) and Advanced Practice Providers (APPs -  Physician Assistants and Nurse Practitioners) who all work together to provide you with the care you need, when you need it.  Your next appointment:   1 year(s)  The format for your next appointment:   In Person  Provider:   You may see Fransico Him, MD or one of the following Advanced Practice Providers on your designated Care Team:    Melina Copa, PA-C  Ermalinda Barrios, PA-C

## 2020-03-14 NOTE — Progress Notes (Addendum)
Date:  03/14/2020   ID:  Brandon Haynes, DOB April 05, 1941, MRN [id number]   PCP:  Tammi Sou, MD  Cardiologist:  Fransico Him, MD Electrophysiologist:  None   Chief Complaint:  CAD, HTN, lipids  History of Present Illness:     Brandon Haynes is a 79 y.o. male with a hx of 2 vesselASCAD with a 99% LAD and an occluded RCA. The RCA was felt well collateralized and heunderwent PCI of the LAD. LV function was 55% at time of cath. Echocardiogram revealed akinesis of the basal-mid inferoseptal myocardium and mid-apical anteroseptal myocardium. His last nuclear stress test showed no ischemia. His angina has presented as CP and hypotension in the past.   He is here today for followup and is doing well.  He denies any chest pain or pressure, SOB, DOE, PND, orthopnea, LE edema, dizziness, palpitations or syncope. He is compliant with his meds and is tolerating meds with no SE.    Prior CV studies:   The following studies were reviewed today:  none  Past Medical History:  Diagnosis Date  . Allergic rhinitis    allergy shots via [place]  . Anxiety   . 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  Lesion resected. No mets.  He got neoadjuvant chemo x 4 cycles.  Cystoprostectomy with ileal conduit formation 12/13/19 (Dr. Tresa Moore).  . 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 C. difficile colitis 01/09/2020; late May 2021   oral vancomycin  . CAD, multiple vessel    a. CP/near-syncope 11/2014 s/p DES to MLAD, occluded RCA after takeoff of RV marginal with collaterals treated medically, otherwise mild nonobstructive disease. EF 55%;  b. 07/2015 Lexiscan CL: EF 56%, no ischemia/infarct.   . Cataracts, both eyes   . Chronic low back pain   . DDD (degenerative disc disease), lumbar   . GERD (gastroesophageal reflux disease)    PPI bid needed to control sx's  . Gout   . Hearing loss   . History  of basal cell carcinoma   .  History of adenomatous polyp of colon    Rpt colonoscopy recommended 02/2020->no further if no high risk lesions at that time.  . Hypercholesteremia   . Hypertension    Hx labile/uncontrolled.  Improved s/p visit to HTN clinic 2019.  . Iron deficiency anemia 2017   Colonoscopy, EGD, and capsule endoscopy w/out obvious cause/source found.  Oral iron helping as of 03/2016.  . Leg pain 03/06/2016  . Nonmelanoma skin cancer   . 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 Peripheral vascular disease ([place])   . Pleural thickening    chronic  . 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 No avid enhancement or mass effect. Obs by urol as of 10/2019.   Past Surgical History:  Procedure Laterality Date  . back injection  oct 2011, 07/2010, 12/2012   no surgery  . Capsule endoscopy  02/2016   mild duodenitis, o/w NEG  . CARDIOVASCULAR STRESS TEST  05/2009; 07/2015  . carotid dopplers  11/2014   Bilateral - 1% to 9% ICA stenosis. Vertebral artery flow is  . COLONOSCOPY  08/2008; 02/2016; 02/2017   2017: 12 polyps (adenomatous).  02/2017 repeat TCS--multiple polyps: recall 3 yrs.  . CORONARY ANGIOPLASTY WITH STENT PLACEMENT  12/04/2014   4.0 x 12 mm Synergy stent to the mid LAD  . CYSTOSCOPY  05/24/2019  . CYSTOSCOPY  W/ TRANSURETHRAL RESECTION OF POSTERIOR URETHERAL VALVES  06/12/2019   2-5cm  . CYSTOSCOPY WITH INJECTION N/A 12/13/2019   Procedure: CYSTOSCOPY WITH INJECTION;  Surgeon: [name], MD;  [facility];  Service: Urology;  Laterality: N/A;  . ESOPHAGOGASTRODUODENOSCOPY  02/2016   Gastritis.  NEG h pylori.    . IR IMAGING GUIDED PORT INSERTION  09/12/2019  . LE arterial vascular study  02/2016   Per Dr. Radford Pax, no evidence of peripheral vascular disease  . LEFT HEART CATHETERIZATION WITH CORONARY ANGIOGRAM N/A 12/04/2014   Procedure: LEFT HEART CATHETERIZATION WITH CORONARY ANGIOGRAM;  Surgeon: Jolaine Artist, MD; LAD 99% then  aneurysmal, OM1 40%, CFX 50%, RCA 40%, 50% then occluded  . LYMPHADENECTOMY Bilateral 12/13/2019   Procedure: LYMPHADENECTOMY;  Surgeon: [name], MD;  [facility];  Service: Urology;  Laterality: Bilateral;  . ROBOT ASSISTED LAPAROSCOPIC COMPLETE CYSTECT ILEAL CONDUIT N/A 12/13/2019   Procedure: XI ROBOTIC ASSISTED LAPAROSCOPIC COMPLETE CYSTECT ILEAL CONDUIT AND RADICAL PROSTATECTOMY;  Surgeon: [name], MD;  [facility];  Service: Urology;  Laterality: N/A;  6 HRS  . ROTATOR CUFF REPAIR Left 09/2013   left  . SHOULDER INJECTION  feb 2014  . TRANSTHORACIC ECHOCARDIOGRAM  11/2014   EF 50-55%, some wall motion abnormalities noted, grade I diast dysfxn  . TRANSURETHRAL RESECTION OF BLADDER TUMOR N/A 06/12/2019   INVASIVE HIGH GRADE PAPILLARY UROTHELIAL CARCINOMA, invading muscularis propria.  Procedure: TRANSURETHRAL RESECTION OF BLADDER TUMOR (TURBT);  Surgeon: Lucas Mallow, MD;  [facility];  Service: Urology;  Laterality: N/A;     Current Meds  Medication Sig  . allopurinol (ZYLOPRIM) 300 MG tablet TAKE 1 TABLET BY MOUTH EVERY DAY  . aspirin EC 81 MG tablet Take 81 mg by mouth daily.  Marland Kitchen desonide (DESOWEN) 0.05 % cream Apply 1 application topically 3 (three) times a week.   . diphenhydrAMINE (BENADRYL) 25 MG tablet Take 50 mg by mouth 3 (three) times daily as needed for itching or allergies.   Marland Kitchen EPINEPHrine 0.3 mg/0.3 mL IJ SOAJ injection Inject 0.3 mg into the muscle as needed for anaphylaxis.   . fluticasone (FLONASE) 50 MCG/ACT nasal spray Place 1 spray into both nostrils daily.  Marland Kitchen gabapentin (NEURONTIN) 300 MG capsule Take 300-600 mg by mouth See admin instructions. Take 2 capsules (600 mg) by mouth in the morning & take 1 capsule (300 mg) by mouth in the afternoon.  . loratadine (CLARITIN) 10 MG tablet Take 20 mg by mouth daily.   Marland Kitchen losartan (COZAAR) 50 MG tablet Take 1 tablet (50 mg total) by mouth daily. Please keep upcoming appt in June with Dr. Radford Pax  before anymore refills. Thank you  . metoprolol tartrate (LOPRESSOR) 25 MG tablet Take 0.5 tablets (12.5 mg total) by mouth 2 (two) times daily. (Patient taking differently: Take 25 mg by mouth 2 (two) times daily. )  . nitroGLYCERIN (NITROSTAT) 0.4 MG SL tablet PLACE 1 TABLET UNDER TONGUE EVERY 5 MINS, UP TO 3 DOSES AS NEEDED FOR CHEST PAIN  . PREVIDENT 5000 BOOSTER PLUS 1.1 % PSTE Take 1 application by mouth at bedtime.   . rosuvastatin (CRESTOR) 20 MG tablet TAKE 1 TABLET BY MOUTH EVERY DAY  . saccharomyces boulardii (FLORASTOR) 250 MG capsule Take 1 capsule (250 mg total) by mouth 2 (two) times daily.  . simethicone (MYLICON) 80 MG chewable tablet Chew 1 tablet (80 mg total) by mouth every 6 (six) hours as needed for flatulence.  . traMADol (ULTRAM) 50 MG  tablet Take 50 mg by mouth 3 (three) times daily as needed for moderate pain.   . vancomycin (VANCOCIN HCL) 125 MG capsule Take 1 capsule (125 mg total) by mouth as directed. Take 1 cap QID x 8 days then 1 cap BID x 7 days, then 1 cap daily x 7 days, then 1 cap every other day for 4 doses, then 1 cap every 3 days x 3 doses     Allergies:   Patient has no known allergies.   Social History   Tobacco Use  . Smoking status: Former Smoker    Packs/day: 2.00    Years: 35.00    Pack years: 70.00    Types: Cigarettes    Quit date: 09/28/1998    Years since quitting: 21.4  . Smokeless tobacco: Former Systems developer    Types: Snuff    Quit date: 02/09/1986  . Tobacco comment: as a teenager  Vaping Use  . Vaping Use: Never used  Substance Use Topics  . Alcohol use: No    Alcohol/week: 0.0 standard drinks    Comment: quit 1994  . Drug use: No     Family Hx: The patient's family history includes Bladder Cancer in his maternal grandfather; Breast cancer in his mother and sister; COPD in his brother; Congestive Heart Failure in his mother; Diabetes in his mother; Heart disease in his brother and father; Lung cancer in his father. There is no history of  Colon cancer, Esophageal cancer, Pancreatic cancer, Rectal cancer, Stomach cancer, or Prostate cancer.  ROS:   Please see the history of present illness.     All other systems reviewed and are negative.   Labs/Other Tests and Data Reviewed:    Recent Labs: 01/08/2020: TSH 1.946 02/21/2020: Magnesium 1.9 02/25/2020: ALT 39; BUN 15; Creatinine, Ser 0.92; Hemoglobin 10.8; Platelets 281; Potassium 3.9; Sodium 137   Recent Lipid Panel Lab Results  Component Value Date/Time   CHOL 119 03/13/2019 11:41 AM   TRIG 106 03/13/2019 11:41 AM   HDL 54 03/13/2019 11:41 AM   CHOLHDL 2.2 03/13/2019 11:41 AM   CHOLHDL 2.5 10/20/2016 10:17 AM   LDLCALC 44 03/13/2019 11:41 AM    Wt Readings from Last 3 Encounters:  03/14/20 198 lb 12.8 oz (90.2 kg)  03/04/20 210 lb 6.4 oz (95.4 kg)  02/19/20 205 lb (93 kg)     Objective:    Vital Signs:  BP 110/70   Pulse (!) 57   Ht 6\' 2"  (1.88 m)   Wt 198 lb 12.8 oz (90.2 kg)   SpO2 96%   BMI 25.52 kg/m   GEN: Well nourished, well developed in no acute distress HEENT: Normal NECK: No JVD; No carotid bruits LYMPHATICS: No lymphadenopathy CARDIAC:RRR, no murmurs, rubs, gallops RESPIRATORY:  Clear to auscultation without rales, wheezing or rhonchi  ABDOMEN: Soft, non-tender, non-distended MUSCULOSKELETAL:  No edema; No deformity  SKIN: Warm and dry NEUROLOGIC:  Alert and oriented x 3 PSYCHIATRIC:  Normal affect    ASSESSMENT & PLAN:    1.  ASCAD  -Cath 2016 showed 2 vesselASCAD with a 99% LAD and an occluded RCA. The RCA was felt well collateralized and heunderwent PCI of the LAD.   -His anginal equivalent is hypotension and CP in the past.   -he denies any anginal symptoms since I last saw him -continue on ASA, statin and BB  2.  Hypertension -BP is well controlled -continue Losartan 50mg  BID, amlodipine 5mg  daily  and Lopressor 25mg  BID  -outside labs  from PCP showed stable creatinine at 0.92 and K+ 3.9    3.  Hyperlipidemia  - his LDL  goal is < 70.   -LDL was 44 a year ago -repeat FLP and ALT -continue Crestor 20mg  daily  Medication Adjustments/Labs and Tests Ordered: Current medicines are reviewed at length with the patient today.  Concerns regarding medicines are outlined above.  Tests Ordered: No orders of the defined types were placed in this encounter.  Medication Changes: No orders of the defined types were placed in this encounter.   Disposition:  Follow up in 1 year(s)  Signed, Fransico Him, MD  03/14/2020 11:06 AM    [place]

## 2020-03-20 ENCOUNTER — Other Ambulatory Visit: Payer: Self-pay

## 2020-03-20 ENCOUNTER — Other Ambulatory Visit: Payer: Medicare HMO | Admitting: *Deleted

## 2020-03-20 DIAGNOSIS — E7800 Pure hypercholesterolemia, unspecified: Secondary | ICD-10-CM

## 2020-03-20 LAB — LIPID PANEL
Chol/HDL Ratio: 1.9 ratio (ref 0.0–5.0)
Cholesterol, Total: 116 mg/dL (ref 100–199)
HDL: 62 mg/dL (ref >39)
LDL Chol Calc (NIH): 37 mg/dL (ref 0–99)
Triglycerides: 85 mg/dL (ref 0–149)
VLDL Cholesterol Cal: 17 mg/dL (ref 5–40)

## 2020-03-20 LAB — ALT: ALT: 8 IU/L (ref 0–44)

## 2020-03-29 ENCOUNTER — Other Ambulatory Visit: Payer: Self-pay

## 2020-03-29 ENCOUNTER — Inpatient Hospital Stay: Payer: Medicare HMO | Attending: Oncology

## 2020-03-29 DIAGNOSIS — C679 Malignant neoplasm of bladder, unspecified: Secondary | ICD-10-CM | POA: Diagnosis present

## 2020-03-29 DIAGNOSIS — Z95828 Presence of other vascular implants and grafts: Secondary | ICD-10-CM

## 2020-03-29 DIAGNOSIS — Z9221 Personal history of antineoplastic chemotherapy: Secondary | ICD-10-CM | POA: Insufficient documentation

## 2020-03-29 DIAGNOSIS — Z452 Encounter for adjustment and management of vascular access device: Secondary | ICD-10-CM | POA: Diagnosis not present

## 2020-03-29 MED ORDER — SODIUM CHLORIDE 0.9% FLUSH
10.0000 mL | INTRAVENOUS | Status: DC | PRN
  Administered 2020-03-29: 10 mL
  Filled 2020-03-29: qty 10

## 2020-03-29 MED ORDER — HEPARIN SOD (PORK) LOCK FLUSH 100 UNIT/ML IV SOLN
500.0000 [IU] | Freq: Once | INTRAVENOUS | Status: AC | PRN
  Administered 2020-03-29: 500 [IU]
  Filled 2020-03-29: qty 5

## 2020-04-03 ENCOUNTER — Encounter: Payer: Self-pay | Admitting: Gastroenterology

## 2020-04-03 ENCOUNTER — Other Ambulatory Visit: Payer: Self-pay | Admitting: Family Medicine

## 2020-04-03 NOTE — Telephone Encounter (Signed)
I'll do #90 with RF x 3. He needs f/u in 3 mo in office for routine follow up of chronic pain.-thx

## 2020-04-03 NOTE — Telephone Encounter (Signed)
Requesting: tramadol Contract:07/22/18 UDS:10/04/18 Last Visit:03/04/20 Next Visit: advised to f/u 1 mo. Last Refill:01/05/20  Please Advise. Not originally prescribed by you. Medication pending

## 2020-04-04 NOTE — Telephone Encounter (Signed)
Patient advised refill sent in and 3 month f/u appt scheduled.

## 2020-05-02 ENCOUNTER — Other Ambulatory Visit: Payer: Self-pay | Admitting: Gastroenterology

## 2020-05-15 ENCOUNTER — Encounter: Payer: Self-pay | Admitting: Gastroenterology

## 2020-05-15 ENCOUNTER — Other Ambulatory Visit: Payer: Self-pay

## 2020-05-15 ENCOUNTER — Ambulatory Visit (AMBULATORY_SURGERY_CENTER): Payer: Self-pay

## 2020-05-15 VITALS — Ht 74.0 in | Wt 198.2 lb

## 2020-05-15 DIAGNOSIS — Z86010 Personal history of colonic polyps: Secondary | ICD-10-CM

## 2020-05-15 MED ORDER — SUTAB 1479-225-188 MG PO TABS: 12.0000 | ORAL_TABLET | ORAL | 0 refills | Status: DC

## 2020-05-15 NOTE — Progress Notes (Signed)
No allergies to soy or egg Pt is not on blood thinners or diet pills Denies issues with sedation/intubation Denies atrial flutter/fib Denies constipation   Emmi instructions given to pt  Pt is aware of Covid safety and care partner requirements.  

## 2020-05-21 ENCOUNTER — Telehealth: Payer: Self-pay

## 2020-05-21 NOTE — Telephone Encounter (Signed)
Received order form for drainage bag, ostomy bag and urine ostomy pouch. Placed on PCP desk to review and sign, if appropriate.

## 2020-05-21 NOTE — Telephone Encounter (Signed)
This needs to be done by pt's urologist/urology office--thx

## 2020-05-21 NOTE — Telephone Encounter (Signed)
Faxed form to Dr.Bell, urologist.

## 2020-05-27 ENCOUNTER — Encounter: Payer: Self-pay | Admitting: Gastroenterology

## 2020-05-27 ENCOUNTER — Ambulatory Visit (AMBULATORY_SURGERY_CENTER): Payer: Medicare HMO | Admitting: Gastroenterology

## 2020-05-27 ENCOUNTER — Other Ambulatory Visit: Payer: Self-pay

## 2020-05-27 VITALS — BP 137/77 | HR 68 | Temp 96.6°F | Resp 12 | Ht 74.0 in | Wt 198.2 lb

## 2020-05-27 DIAGNOSIS — D123 Benign neoplasm of transverse colon: Secondary | ICD-10-CM

## 2020-05-27 DIAGNOSIS — Z86010 Personal history of colonic polyps: Secondary | ICD-10-CM | POA: Diagnosis not present

## 2020-05-27 MED ORDER — SODIUM CHLORIDE 0.9 % IV SOLN: 500.0000 mL | Freq: Once | INTRAVENOUS | Status: DC

## 2020-05-27 NOTE — Progress Notes (Signed)
To PACU, VSS. Report to [email or web address] 

## 2020-05-27 NOTE — Progress Notes (Signed)
VS by AG  Pt's states no medical or surgical changes since previsit or office visit.  

## 2020-05-27 NOTE — Op Note (Signed)
[facility] Patient Name: Brandon Haynes Procedure Date: 05/27/2020 3:05 PM MRN: [id number] Endoscopist: Remo Lipps P. Havery Moros , MD Age: 79 Referring MD:  Date of Birth: [birth year] Gender: Male Account #: [id number] Procedure:                Colonoscopy Indications:              High risk colon cancer surveillance: Personal                            history of colonic polyps (12 adenomas removed in                            2017, 7 polyps in 2018) Medicines:                Monitored Anesthesia Care Procedure:                Pre-Anesthesia Assessment:                           - Prior to the procedure, a History and Physical                            was performed, and patient medications and                            allergies were reviewed. The patient's tolerance of                            previous anesthesia was also reviewed. The risks                            and benefits of the procedure and the sedation                            options and risks were discussed with the patient.                            All questions were answered, and informed consent                            was obtained. Prior Anticoagulants: The patient has                            taken no previous anticoagulant or antiplatelet                            agents. ASA Grade Assessment: III - A patient with                            severe systemic disease. After reviewing the risks                            and benefits, the patient was deemed in  satisfactory condition to undergo the procedure.                           After obtaining informed consent, the colonoscope                            was passed under direct vision. Throughout the                            procedure, the patient's blood pressure, pulse, and                            oxygen saturations were monitored continuously. The                            Colonoscope was introduced through  the anus and                            advanced to the the cecum, identified by                            appendiceal orifice and ileocecal valve. The                            colonoscopy was performed without difficulty. The                            patient tolerated the procedure well. The quality                            of the bowel preparation was adequate. The                            ileocecal valve, appendiceal orifice, and rectum                            were photographed. Scope In: 3:14:56 PM Scope Out: 3:36:52 PM Scope Withdrawal Time: 0 hours 15 minutes 7 seconds  Total Procedure Duration: 0 hours 21 minutes 56 seconds  Findings:                 The perianal and digital rectal examinations were                            normal.                           Two sessile polyps were found in the transverse                            colon. The polyps were 3 to 6 mm in size. These                            polyps were removed with a cold snare. Resection  and retrieval were complete.                           A few medium-mouthed diverticula were found in the                            sigmoid colon.                           Internal hemorrhoids were found during retroflexion.                           There was restricted mobility of the sigmoid colon.                            The exam was otherwise without abnormality. Complications:            No immediate complications. Estimated blood loss:                            Minimal. Estimated Blood Loss:     Estimated blood loss was minimal. Impression:               - Two 3 to 6 mm polyps in the transverse colon,                            removed with a cold snare. Resected and retrieved.                           - Diverticulosis in the sigmoid colon.                           - Internal hemorrhoids.                           - The examination was otherwise normal. Recommendation:            - Patient has a contact number available for                            emergencies. The signs and symptoms of potential                            delayed complications were discussed with the                            patient. Return to normal activities tomorrow.                            Written discharge instructions were provided to the                            patient.                           - Resume previous diet.                           -  Continue present medications.                           - Await pathology results.                           - Likely no further surveillance colonoscopy is                            warranted given no high risk lesions on this exam                            and the patient's age Carlota Raspberry. Copelan Maultsby, MD 05/27/2020 3:42:11 PM This report has been signed electronically.

## 2020-05-27 NOTE — Progress Notes (Signed)
Called to room to assist during endoscopic procedure.  Patient ID and intended procedure confirmed with present staff. Received instructions for my participation in the procedure from the performing physician.  

## 2020-05-27 NOTE — Patient Instructions (Signed)
Handouts on polyps,diverticulosis,& hemorrhoids given to you today  Await pathology results on polyps removed today    YOU HAD AN ENDOSCOPIC PROCEDURE TODAY AT [facility]:   Refer to the procedure report that was given to you for any specific questions about what was found during the examination.  If the procedure report does not answer your questions, please call your gastroenterologist to clarify.  If you requested that your care partner not be given the details of your procedure findings, then the procedure report has been included in a sealed envelope for you to review at your convenience later.  YOU SHOULD EXPECT: Some feelings of bloating in the abdomen. Passage of more gas than usual.  Walking can help get rid of the air that was put into your GI tract during the procedure and reduce the bloating. If you had a lower endoscopy (such as a colonoscopy or flexible sigmoidoscopy) you may notice spotting of blood in your stool or on the toilet paper. If you underwent a bowel prep for your procedure, you may not have a normal bowel movement for a few days.  Please Note:  You might notice some irritation and congestion in your nose or some drainage.  This is from the oxygen used during your procedure.  There is no need for concern and it should clear up in a day or so.  SYMPTOMS TO REPORT IMMEDIATELY:   Following lower endoscopy (colonoscopy or flexible sigmoidoscopy):  Excessive amounts of blood in the stool  Significant tenderness or worsening of abdominal pains  Swelling of the abdomen that is new, acute  Fever of 100F or higher    For urgent or emergent issues, a gastroenterologist can be reached at any hour by calling [phone]. Do not use MyChart messaging for urgent concerns.    DIET:  We do recommend a small meal at first, but then you may proceed to your regular diet.  Drink plenty of fluids but you should avoid alcoholic beverages for 24  hours.  ACTIVITY:  You should plan to take it easy for the rest of today and you should NOT DRIVE or use heavy machinery until tomorrow (because of the sedation medicines used during the test).    FOLLOW UP: Our staff will call the number listed on your records 48-72 hours following your procedure to check on you and address any questions or concerns that you may have regarding the information given to you following your procedure. If we do not reach you, we will leave a message.  We will attempt to reach you two times.  During this call, we will ask if you have developed any symptoms of COVID 19. If you develop any symptoms (ie: fever, flu-like symptoms, shortness of breath, cough etc.) before then, please call [phone].  If you test positive for Covid 19 in the 2 weeks post procedure, please call and report this information to [place].    If any biopsies were taken you will be contacted by phone or by letter within the next 1-3 weeks.  Please call us at [phone] if you have not heard about the biopsies in 3 weeks.    SIGNATURES/CONFIDENTIALITY: You and/or your care partner have signed paperwork which will be entered into your electronic medical record.  These signatures attest to the fact that that the information above on your After Visit Summary has been reviewed and is understood.  Full responsibility of the confidentiality of this discharge information lies with you  and/or your care-partner. 

## 2020-05-29 ENCOUNTER — Telehealth: Payer: Self-pay | Admitting: Gastroenterology

## 2020-05-29 ENCOUNTER — Telehealth: Payer: Self-pay

## 2020-05-29 NOTE — Telephone Encounter (Signed)
Pt is requesting a call back from a nurse to discuss colonoscopy results. 

## 2020-05-29 NOTE — Telephone Encounter (Signed)
Lm on vm for patient to return call 

## 2020-05-29 NOTE — Telephone Encounter (Signed)
Spoke with patient and advised that we have not received his pathology results yet as they get sent to an outside lab. Advised that once we receive the pathology results the doctor will review them and we will call him with more information.

## 2020-05-29 NOTE — Telephone Encounter (Signed)
  Follow up Call-  Call back number 05/27/2020  Post procedure Call Back phone  # [phone]  Permission to leave phone message Yes  Some recent data might be hidden     Patient questions:  Do you have a fever, pain , or abdominal swelling? No. Pain Score  0 *  Have you tolerated food without any problems? Yes.    Have you been able to return to your normal activities? Yes.    Do you have any questions about your discharge instructions: Diet   No. Medications  No. Follow up visit  No.  Do you have questions or concerns about your Care? No.  Actions: * If pain score is 4 or above: No action needed, pain <4.  Have you developed a fever since your procedure? No 2.   Have you had an respiratory symptoms (SOB or cough) since your procedure? No 3.   Have you tested positive for COVID 19 since your procedure No  4.   Have you had any family members/close contacts diagnosed with the COVID 19 since your procedure? No  If yes to any of these questions please route to Joylene [place], RN and Joella Prince, RN

## 2020-05-30 ENCOUNTER — Other Ambulatory Visit: Payer: Self-pay | Admitting: Cardiology

## 2020-05-31 ENCOUNTER — Other Ambulatory Visit: Payer: Self-pay

## 2020-05-31 ENCOUNTER — Inpatient Hospital Stay: Payer: Medicare HMO | Attending: Oncology

## 2020-05-31 DIAGNOSIS — C679 Malignant neoplasm of bladder, unspecified: Secondary | ICD-10-CM | POA: Insufficient documentation

## 2020-05-31 DIAGNOSIS — Z95828 Presence of other vascular implants and grafts: Secondary | ICD-10-CM

## 2020-05-31 DIAGNOSIS — Z452 Encounter for adjustment and management of vascular access device: Secondary | ICD-10-CM | POA: Diagnosis not present

## 2020-05-31 MED ORDER — HEPARIN SOD (PORK) LOCK FLUSH 100 UNIT/ML IV SOLN
500.0000 [IU] | Freq: Once | INTRAVENOUS | Status: AC | PRN
  Administered 2020-05-31: 500 [IU]
  Filled 2020-05-31: qty 5

## 2020-05-31 MED ORDER — SODIUM CHLORIDE 0.9% FLUSH
10.0000 mL | INTRAVENOUS | Status: DC | PRN
  Administered 2020-05-31: 10 mL
  Filled 2020-05-31: qty 10

## 2020-06-04 LAB — PSA: PSA: <0.015

## 2020-06-06 ENCOUNTER — Other Ambulatory Visit (HOSPITAL_COMMUNITY): Payer: Self-pay | Admitting: Urology

## 2020-06-06 ENCOUNTER — Ambulatory Visit (HOSPITAL_COMMUNITY)
Admission: RE | Admit: 2020-06-06 | Discharge: 2020-06-06 | Disposition: A | Discharge status: Home / Self Care | Payer: Medicare HMO | Source: Ambulatory Visit | Attending: Urology | Admitting: Urology

## 2020-06-06 ENCOUNTER — Other Ambulatory Visit: Payer: Self-pay

## 2020-06-06 DIAGNOSIS — C672 Malignant neoplasm of lateral wall of bladder: Secondary | ICD-10-CM | POA: Diagnosis not present

## 2020-07-05 ENCOUNTER — Encounter: Payer: Self-pay | Admitting: Family Medicine

## 2020-07-05 ENCOUNTER — Ambulatory Visit: Payer: Medicare HMO | Admitting: Family Medicine

## 2020-07-05 ENCOUNTER — Other Ambulatory Visit: Payer: Self-pay

## 2020-07-05 VITALS — BP 149/89 | HR 79 | Temp 97.8°F | Ht 74.0 in | Wt 194.0 lb

## 2020-07-05 DIAGNOSIS — D649 Anemia, unspecified: Secondary | ICD-10-CM | POA: Diagnosis not present

## 2020-07-05 DIAGNOSIS — G894 Chronic pain syndrome: Secondary | ICD-10-CM

## 2020-07-05 DIAGNOSIS — I10 Essential (primary) hypertension: Secondary | ICD-10-CM | POA: Diagnosis not present

## 2020-07-05 DIAGNOSIS — R7401 Elevation of levels of liver transaminase levels: Secondary | ICD-10-CM

## 2020-07-05 DIAGNOSIS — E7800 Pure hypercholesterolemia, unspecified: Secondary | ICD-10-CM

## 2020-07-05 NOTE — Progress Notes (Signed)
OFFICE VISIT  08/18/2020  CC: f/u chronic pain  HPI:    Patient is a 79 y.o. Caucasian male who presents for f/u chronic pain syndrome, HTN, HLD.  Indication for chronic opioid:chronic low back pain (DDD) andbilat LL neuropathic pain. Opioids r'xd to maximize functioning and quality of life. Pt has gotten periodic ESI but has no hx of back surgery Medication and dose:tramadol 50mg , 1tid prn # pills per month: 90 PMP AWARE reviewed today: most recent rx for tramadol was filled 06/04/20, # 30, rx by me. No red flags.  Still using 2-3 tramadol, usually at bedtime or middle of the night. He had a back injection 05/23/20.  Pain is improved, esp inback but leg pains not improved.  He can mow for a couple hours w/out pain. Plan on another injection 08/2020.  Home bps: rarely taking bp med b/c he checks it twice daily and sees 120s-135 syst/diast 60s-70s.  No HR data. Says if he takes losartan (occ does this if bp over 140) but when he does this he ends up having fatigue/lightheadedness.  Urol f/u several times last month: no sign of any cancer anymore at this time.  HLD: takes statin daily w/out problem.  ROS: no fevers, no CP, no SOB, no wheezing, no cough, no dizziness, no HAs, no rashes, no melena/hematochezia.  No polyuria or polydipsia.  No myalgias or arthralgias.  No focal weakness, paresthesias, or tremors.  No acute vision or hearing abnormalities. No n/v/d or abd pain.  No palpitations.    Past Medical History:  Diagnosis Date  . Allergic rhinitis    allergy shots via [place]  . Anxiety   . 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  Lesion resected. No mets.  He got neoadjuvant chemo x 4 cycles.  Cystoprostectomy with ileal conduit formation 12/13/19 (Dr. Tresa Moore).  . 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 C. difficile colitis 01/09/2020; late May 2021   oral vancomycin  . CAD, multiple vessel    a.  CP/near-syncope 11/2014 s/p DES to MLAD, occluded RCA after takeoff of RV marginal with collaterals treated medically, otherwise mild nonobstructive disease. EF 55%;  b. 07/2015 Lexiscan CL: EF 56%, no ischemia/infarct.   . Cataracts, both eyes   . Chronic constipation    worse since ileal conduit surgery was done 2020  . Chronic low back pain   . DDD (degenerative disc disease), lumbar   . GERD (gastroesophageal reflux disease)    PPI bid needed to control sx's  . Gout   . Hearing loss   . History  of basal cell carcinoma   . History of adenomatous polyp of colon    Rpt colonoscopy recommended 02/2020->no further if no high risk lesions at that time.  . Hypercholesteremia   . Hypertension    Hx labile/uncontrolled.  Improved s/p visit to HTN clinic 2019.  . Iron deficiency anemia 2017   Colonoscopy, EGD, and capsule endoscopy w/out obvious cause/source found.  Oral iron helping as of 03/2016.  . Leg pain 03/06/2016  . Nonmelanoma skin cancer   . 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 Peripheral vascular disease ([place])   . Pleural thickening    chronic  . 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 No avid enhancement or mass effect. Obs by urol as of 10/2019.    Past Surgical History:  Procedure Laterality Date  . back injection  oct 2011, 07/2010, 12/2012   no surgery  . Capsule endoscopy  02/2016   mild duodenitis, o/w NEG  . CARDIOVASCULAR STRESS TEST  05/2009; 07/2015  . carotid dopplers  11/2014   Bilateral - 1% to 9% ICA stenosis. Vertebral artery flow is  . COLONOSCOPY  08/2008; 02/2016; 02/2017   2017: 12 polyps (adenomatous).  02/2017 repeat TCS--multiple polyps: recall 3 yrs.  . CORONARY ANGIOPLASTY WITH STENT PLACEMENT  12/04/2014   4.0 x 12 mm Synergy stent to the mid LAD  . CYSTOSCOPY  05/24/2019  . CYSTOSCOPY W/ TRANSURETHRAL RESECTION OF POSTERIOR URETHERAL VALVES  06/12/2019   2-5cm  . CYSTOSCOPY WITH INJECTION  N/A 12/13/2019   Procedure: CYSTOSCOPY WITH INJECTION;  Surgeon: [name], MD;  Location: [facility];  Service: Urology;  Laterality: N/A;  . ESOPHAGOGASTRODUODENOSCOPY  02/2016   Gastritis.  NEG h pylori.    . IR IMAGING GUIDED PORT INSERTION  09/12/2019  . LE arterial vascular study  02/2016   Per Dr. Radford Pax, no evidence of peripheral vascular disease  . LEFT HEART CATHETERIZATION WITH CORONARY ANGIOGRAM N/A 12/04/2014   Procedure: LEFT HEART CATHETERIZATION WITH CORONARY ANGIOGRAM;  Surgeon: Jolaine Artist, MD; LAD 99% then aneurysmal, OM1 40%, CFX 50%, RCA 40%, 50% then occluded  . LYMPHADENECTOMY Bilateral 12/13/2019   Procedure: LYMPHADENECTOMY;  Surgeon: [name], MD;  Location: [facility];  Service: Urology;  Laterality: Bilateral;  . ROBOT ASSISTED LAPAROSCOPIC COMPLETE CYSTECT ILEAL CONDUIT N/A 12/13/2019   Procedure: XI ROBOTIC ASSISTED LAPAROSCOPIC COMPLETE CYSTECT ILEAL CONDUIT AND RADICAL PROSTATECTOMY;  Surgeon: [name], MD;  Location: [facility];  Service: Urology;  Laterality: N/A;  6 HRS  . ROTATOR CUFF REPAIR Left 09/2013   left  . SHOULDER INJECTION  feb 2014  . TRANSTHORACIC ECHOCARDIOGRAM  11/2014   EF 50-55%, some wall motion abnormalities noted, grade I diast dysfxn  . TRANSURETHRAL RESECTION OF BLADDER TUMOR N/A 06/12/2019   INVASIVE HIGH GRADE PAPILLARY UROTHELIAL CARCINOMA, invading muscularis propria.  Procedure: TRANSURETHRAL RESECTION OF BLADDER TUMOR (TURBT);  Surgeon: Lucas Mallow, MD;  Location: [facility];  Service: Urology;  Laterality: N/A;    Outpatient Medications Prior to Visit  Medication Sig Dispense Refill  . amLODipine (NORVASC) 5 MG tablet Take 5 mg by mouth daily.     Marland Kitchen aspirin EC 81 MG tablet Take 81 mg by mouth daily.    Marland Kitchen desonide (DESOWEN) 0.05 % cream Apply 1 application topically 3 (three) times a week.   1  . diphenhydrAMINE (BENADRYL) 25 MG tablet Take 50 mg by mouth 3 (three) times daily as needed for itching or allergies.     Marland Kitchen  EPINEPHrine 0.3 mg/0.3 mL IJ SOAJ injection Inject 0.3 mg into the muscle as needed for anaphylaxis.     . fluticasone (FLONASE) 50 MCG/ACT nasal spray Place 1 spray into both nostrils daily.    Marland Kitchen loratadine (CLARITIN) 10 MG tablet Take 20 mg by mouth daily.     . nitroGLYCERIN (NITROSTAT) 0.4 MG SL tablet PLACE 1 TABLET UNDER TONGUE EVERY 5 MINS, UP TO 3 DOSES AS NEEDED FOR CHEST PAIN 25 tablet 1  . PREVIDENT 5000 BOOSTER PLUS 1.1 % PSTE Take 1 application by mouth at bedtime.   1  . allopurinol (ZYLOPRIM) 300 MG tablet TAKE 1 TABLET BY MOUTH EVERY DAY 90 tablet 0  . pantoprazole (PROTONIX) 40 MG tablet TAKE 1 TABLET BY MOUTH EVERY DAY 90 tablet 0  . rosuvastatin (CRESTOR) 20 MG tablet TAKE 1  TABLET BY MOUTH EVERY DAY 90 tablet 1  . traMADol (ULTRAM) 50 MG tablet TAKE 1 TABLET BY MOUTH 3 TIMES A DAY AS NEEDED FOR PAIN 90 tablet 3  . gabapentin (NEURONTIN) 300 MG capsule Take 300-600 mg by mouth See admin instructions. Take 2 capsules (600 mg) by mouth in the morning & take 1 capsule (300 mg) by mouth in the afternoon. (Patient not taking: Reported on 07/05/2020)    . losartan (COZAAR) 50 MG tablet Take 1 tablet (50 mg total) by mouth daily. (Patient not taking: Reported on 07/05/2020) 90 tablet 3  . metoprolol tartrate (LOPRESSOR) 25 MG tablet Take 0.5 tablets (12.5 mg total) by mouth 2 (two) times daily. (Patient not taking: Reported on 07/05/2020) 180 tablet 3   No facility-administered medications prior to visit.    No Known Allergies  ROS As per HPI  PE: Vitals with BMI 07/31/2020 07/05/2020 05/27/2020  Height 6\' 2"  6\' 2"  -  Weight 195 lbs 10 oz 194 lbs -  BMI 63.0 16.0 -  Systolic 109 323 557  Diastolic 81 89 77  Pulse 58 79 68     Gen: Alert, well appearing.  Patient is oriented to person, place, time, and situation. AFFECT: pleasant, lucid thought and speech. CV: RRR, no m/r/g.   LUNGS: CTA bilat, nonlabored resps, good aeration in all lung fields. EXT: no clubbing or cyanosis.   no edema.    LABS:  Lab Results  Component Value Date   TSH 1.946 01/08/2020   Lab Results  Component Value Date   WBC 7.5 07/31/2020   HGB 12.1 (L) 07/31/2020   HCT 38.0 (L) 07/31/2020   MCV 87.8 07/31/2020   PLT 146 (L) 07/31/2020   Lab Results  Component Value Date   IRON 201 (H) 08/18/2016   FERRITIN 74.6 08/18/2016   Lab Results  Component Value Date   VITAMINB12 592 02/10/2016    Lab Results  Component Value Date   CREATININE 1.07 07/31/2020   BUN 15 07/31/2020   NA 142 07/31/2020   K 4.0 07/31/2020   CL 108 07/31/2020   CO2 29 07/31/2020   Lab Results  Component Value Date   ALT 14 07/31/2020   AST 17 07/31/2020   ALKPHOS 50 07/31/2020   BILITOT 0.5 07/31/2020   Lab Results  Component Value Date   CHOL 116 03/20/2020   Lab Results  Component Value Date   HDL 62 03/20/2020   Lab Results  Component Value Date   LDLCALC 37 03/20/2020   Lab Results  Component Value Date   TRIG 85 03/20/2020   Lab Results  Component Value Date   CHOLHDL 1.9 03/20/2020   Lab Results  Component Value Date   PSA <0.015 06/04/2020   PSA 2.13 03/22/2014   PSA 1.77 02/21/2013   Lab Results  Component Value Date   HGBA1C 5.2 04/05/2019   IMPRESSION AND PLAN:  1) HTN: bp's good lately, with tendency to feel like bp too low if he takes his prn losartan. No further prn losartan at this time. Restart 1/2 of 25mg  lopressor bid and see if bp consistently <130 and no sx's after taking it. He'll be getting blood work 07/31/20 at oncologist's office.  2) HLD: tolerating rosuva 20mg  qd. LDL 37 June this year. Plan recheck after 08/2020.  3) Chronic pain syndrome. Control is good. No new pain med rx needed. CSC updated today.  F/u 3 mo f/u [facility]  An After Visit Summary was printed and  given to the patient.  FOLLOW UP: Return in about 3 months (around 10/05/2020) for routine chronic illness f/u.  Signed:  Crissie Sickles, MD           08/18/2020

## 2020-07-05 NOTE — Patient Instructions (Signed)
Restart 1/2 of your 25mg  lopressor tab twice daily

## 2020-07-29 ENCOUNTER — Other Ambulatory Visit: Payer: Self-pay | Admitting: Gastroenterology

## 2020-07-31 ENCOUNTER — Inpatient Hospital Stay: Payer: Medicare HMO

## 2020-07-31 ENCOUNTER — Other Ambulatory Visit: Payer: Self-pay

## 2020-07-31 ENCOUNTER — Inpatient Hospital Stay: Payer: Medicare HMO | Attending: Oncology | Admitting: Oncology

## 2020-07-31 VITALS — BP 121/81 | HR 58 | Temp 96.5°F | Resp 18 | Ht 74.0 in | Wt 195.6 lb

## 2020-07-31 DIAGNOSIS — Z8551 Personal history of malignant neoplasm of bladder: Secondary | ICD-10-CM | POA: Diagnosis not present

## 2020-07-31 DIAGNOSIS — C679 Malignant neoplasm of bladder, unspecified: Secondary | ICD-10-CM | POA: Diagnosis not present

## 2020-07-31 DIAGNOSIS — Z79899 Other long term (current) drug therapy: Secondary | ICD-10-CM | POA: Diagnosis not present

## 2020-07-31 DIAGNOSIS — Z7982 Long term (current) use of aspirin: Secondary | ICD-10-CM | POA: Diagnosis not present

## 2020-07-31 DIAGNOSIS — Z95828 Presence of other vascular implants and grafts: Secondary | ICD-10-CM

## 2020-07-31 LAB — CMP (CANCER CENTER ONLY)
ALT: 14 U/L (ref 0–44)
AST: 17 U/L (ref 15–41)
Albumin: 4 g/dL (ref 3.5–5.0)
Alkaline Phosphatase: 50 U/L (ref 38–126)
Anion gap: 5 (ref 5–15)
BUN: 15 mg/dL (ref 8–23)
CO2: 29 mmol/L (ref 22–32)
Calcium: 8.9 mg/dL (ref 8.9–10.3)
Chloride: 108 mmol/L (ref 98–111)
Creatinine: 1.07 mg/dL (ref 0.61–1.24)
GFR, Estimated: >60 mL/min (ref >60)
Glucose, Bld: 84 mg/dL (ref 70–99)
Potassium: 4 mmol/L (ref 3.5–5.1)
Sodium: 142 mmol/L (ref 135–145)
Total Bilirubin: 0.5 mg/dL (ref 0.3–1.2)
Total Protein: 6.7 g/dL (ref 6.5–8.1)

## 2020-07-31 LAB — CBC WITH DIFFERENTIAL (CANCER CENTER ONLY)
Abs Immature Granulocytes: 0.02 10*3/uL (ref 0.00–0.07)
Basophils Absolute: 0 10*3/uL (ref 0.0–0.1)
Basophils Relative: 1 %
Eosinophils Absolute: 0.4 10*3/uL (ref 0.0–0.5)
Eosinophils Relative: 5 %
HCT: 38 % — ABNORMAL LOW (ref 39.0–52.0)
Hemoglobin: 12.1 g/dL — ABNORMAL LOW (ref 13.0–17.0)
Immature Granulocytes: 0 %
Lymphocytes Relative: 34 %
Lymphs Abs: 2.6 10*3/uL (ref 0.7–4.0)
MCH: 27.9 pg (ref 26.0–34.0)
MCHC: 31.8 g/dL (ref 30.0–36.0)
MCV: 87.8 fL (ref 80.0–100.0)
Monocytes Absolute: 0.6 10*3/uL (ref 0.1–1.0)
Monocytes Relative: 8 %
Neutro Abs: 3.9 10*3/uL (ref 1.7–7.7)
Neutrophils Relative %: 52 %
Platelet Count: 146 10*3/uL — ABNORMAL LOW (ref 150–400)
RBC: 4.33 MIL/uL (ref 4.22–5.81)
RDW: 15.2 % (ref 11.5–15.5)
WBC Count: 7.5 10*3/uL (ref 4.0–10.5)
nRBC: 0 % (ref 0.0–0.2)

## 2020-07-31 MED ORDER — HEPARIN SOD (PORK) LOCK FLUSH 100 UNIT/ML IV SOLN
500.0000 [IU] | Freq: Once | INTRAVENOUS | Status: AC | PRN
  Administered 2020-07-31: 500 [IU]
  Filled 2020-07-31: qty 5

## 2020-07-31 MED ORDER — SODIUM CHLORIDE 0.9% FLUSH
10.0000 mL | INTRAVENOUS | Status: DC | PRN
  Administered 2020-07-31: 10 mL
  Filled 2020-07-31: qty 10

## 2020-07-31 NOTE — Patient Instructions (Signed)

## 2020-07-31 NOTE — Progress Notes (Signed)
Hematology and Oncology Follow Up Visit  Brandon Haynes [id number] Feb 02, 1941 79 y.o. 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     Prior Therapy:  He is status post TURBT on 06/12/2019 which showed high grade invasive urothelial carcinoma invading into the muscularis propria.  Neoadjuvant chemotherapy utilizing cisplatin and gemcitabine.  He completed 4 cycles of chemotherapy on October 20, 2019.  He is status post radical cystectomy and lymphadenectomy completed on December 13, 2019.  Final pathology showed The carcinoma in situ with 0 out of 12 lymph node involvement.    Current therapy: Active surveillance.    Interim History: Brandon Haynes is here for repeat evaluation.  Since the last visit, he reports no major changes in his health.  He continues to recover from his surgery without any complications.  He denies any chest pain or shortness of breath.  He denies any abdominal pain flank pain or hematuria.      Medications: Updated on review. Current Outpatient Medications  Medication Sig Dispense Refill  . allopurinol (ZYLOPRIM) 300 MG tablet TAKE 1 TABLET BY MOUTH EVERY DAY 90 tablet 0  . amLODipine (NORVASC) 5 MG tablet Take 5 mg by mouth daily.     Marland Kitchen aspirin EC 81 MG tablet Take 81 mg by mouth daily.    Marland Kitchen desonide (DESOWEN) 0.05 % cream Apply 1 application topically 3 (three) times a week.   1  . diphenhydrAMINE (BENADRYL) 25 MG tablet Take 50 mg by mouth 3 (three) times daily as needed for itching or allergies.     Marland Kitchen EPINEPHrine 0.3 mg/0.3 mL IJ SOAJ injection Inject 0.3 mg into the muscle as needed for anaphylaxis.     . fluticasone (FLONASE) 50 MCG/ACT nasal spray Place 1 spray into both nostrils daily.    Marland Kitchen gabapentin (NEURONTIN) 300 MG capsule Take 300-600 mg by mouth  See admin instructions. Take 2 capsules (600 mg) by mouth in the morning & take 1 capsule (300 mg) by mouth in the afternoon. (Patient not taking: Reported on 07/05/2020)    . loratadine (CLARITIN) 10 MG tablet Take 20 mg by mouth daily.     Marland Kitchen losartan (COZAAR) 50 MG tablet Take 1 tablet (50 mg total) by mouth daily. (Patient not taking: Reported on 07/05/2020) 90 tablet 3  . metoprolol tartrate (LOPRESSOR) 25 MG tablet Take 0.5 tablets (12.5 mg total) by mouth 2 (two) times daily. (Patient not taking: Reported on 07/05/2020) 180 tablet 3  . nitroGLYCERIN (NITROSTAT) 0.4 MG SL tablet PLACE 1 TABLET UNDER TONGUE EVERY 5 MINS, UP TO 3 DOSES AS NEEDED FOR CHEST PAIN 25 tablet 1  . pantoprazole (PROTONIX) 40 MG tablet TAKE 1 TABLET BY MOUTH EVERY DAY 90 tablet 0  . PREVIDENT 5000 BOOSTER PLUS 1.1 % PSTE Take 1 application by mouth at bedtime.   1  . rosuvastatin (CRESTOR) 20 MG tablet TAKE 1 TABLET BY MOUTH EVERY DAY 90 tablet 1  . traMADol (ULTRAM) 50 MG tablet TAKE 1 TABLET BY MOUTH 3 TIMES A DAY AS NEEDED FOR PAIN 90 tablet 3   No current facility-administered medications for this visit.   Facility-Administered Medications Ordered in Other Visits  Medication Dose Route Frequency Provider Last Rate Last Admin  . sodium chloride flush (NS) 0.9 % injection 10 mL  10 mL Intracatheter  PRN Wyatt Portela, MD   10 mL at 07/31/20 1006     Allergies: No Known Allergies      Physical Exam:   Blood pressure 121/81, pulse (!) 58, temperature (!) 96.5 F (35.8 C), temperature source Tympanic, resp. rate 18, height 6\' 2"  (1.88 m), weight 195 lb 9.6 oz (88.7 kg), SpO2 100 %.    ECOG: 0    General appearance: Alert, awake without any distress. Head: Atraumatic without abnormalities Oropharynx: Without any thrush or ulcers. Eyes: No scleral icterus. Lymph nodes: No lymphadenopathy noted in the cervical, supraclavicular, or axillary nodes Heart:regular rate and rhythm, without any murmurs or  gallops.   Lung: Clear to auscultation without any rhonchi, wheezes or dullness to percussion. Abdomin: Soft, nontender without any shifting dullness or ascites. Musculoskeletal: No clubbing or cyanosis. Neurological: No motor or sensory deficits. Skin: No rashes or lesions.        Lab Results: Lab Results  Component Value Date   WBC 8.9 02/25/2020   HGB 10.8 (L) 02/25/2020   HCT 34.0 (L) 02/25/2020   MCV 91.4 02/25/2020   PLT 281 02/25/2020     Chemistry      Component Value Date/Time   NA 137 02/25/2020 0400   NA 143 03/13/2019 1141   K 3.9 02/25/2020 0400   CL 109 02/25/2020 0400   CO2 22 02/25/2020 0400   BUN 15 02/25/2020 0400   BUN 11 03/13/2019 1141   CREATININE 0.92 02/25/2020 0400   CREATININE 1.01 01/30/2020 0930   CREATININE 0.94 01/08/2020 0954      Component Value Date/Time   CALCIUM 7.7 (L) 02/25/2020 0400   ALKPHOS 54 02/25/2020 0400   AST 59 (H) 02/25/2020 0400   AST 17 01/30/2020 0930   ALT 8 03/20/2020 0823   ALT 9 01/30/2020 0930   BILITOT 0.6 02/25/2020 0400   BILITOT 0.4 01/30/2020 0930      Impression and Plan:  79 year old man with:  1.  Bladder cancer diagnosed in August 2020.  He was found to have high-grade urothelial carcinoma with no residual carcinoma in situ after surgical resection.Marland Kitchen   He is currently on active surveillance without any evidence of relapsed disease.  The natural course of his disease and risk of recurrence was discussed and remains low at this time.  He will continue to have repeat imaging studies in the care of Dr. Tresa Moore.  Imaging studies obtained in September 2021 showed no evidence of disease relapse.  2.  IV access: Risks and benefits of Port-A-Cath removal were discussed at this time.  3.  Anemia: Hemoglobin corrected at this time.   4  Follow-up: He will return in 6 months for repeat follow-up.  30 minutes were dedicated to this encounter.  Time spent on reviewing his disease status, reviewing  imaging studies and future plan of care review.     Zola Button, MD 11/3/202110:11 AM

## 2020-08-03 IMAGING — DX DG LUMBAR SPINE COMPLETE 4+V
5 series · 5 of 5 positions shown · non-contrast
Comparison: MRI lumbar spine dated April 03, 2017.

CLINICAL DATA: Left lower back pain after fall.

EXAM:
LUMBAR SPINE - COMPLETE 4+ VIEW

[lumbar spine ap]
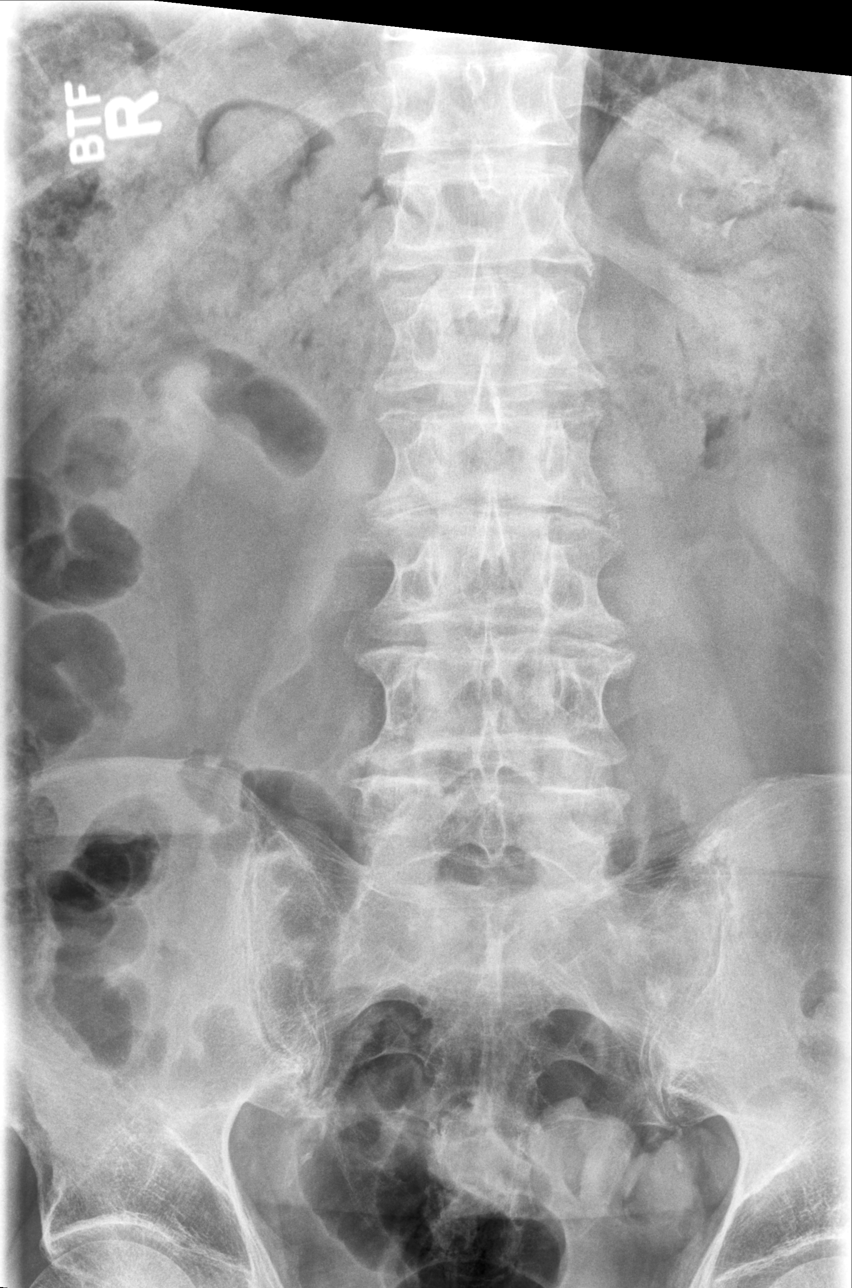

[lumbar spine oblique (1 of 2)]
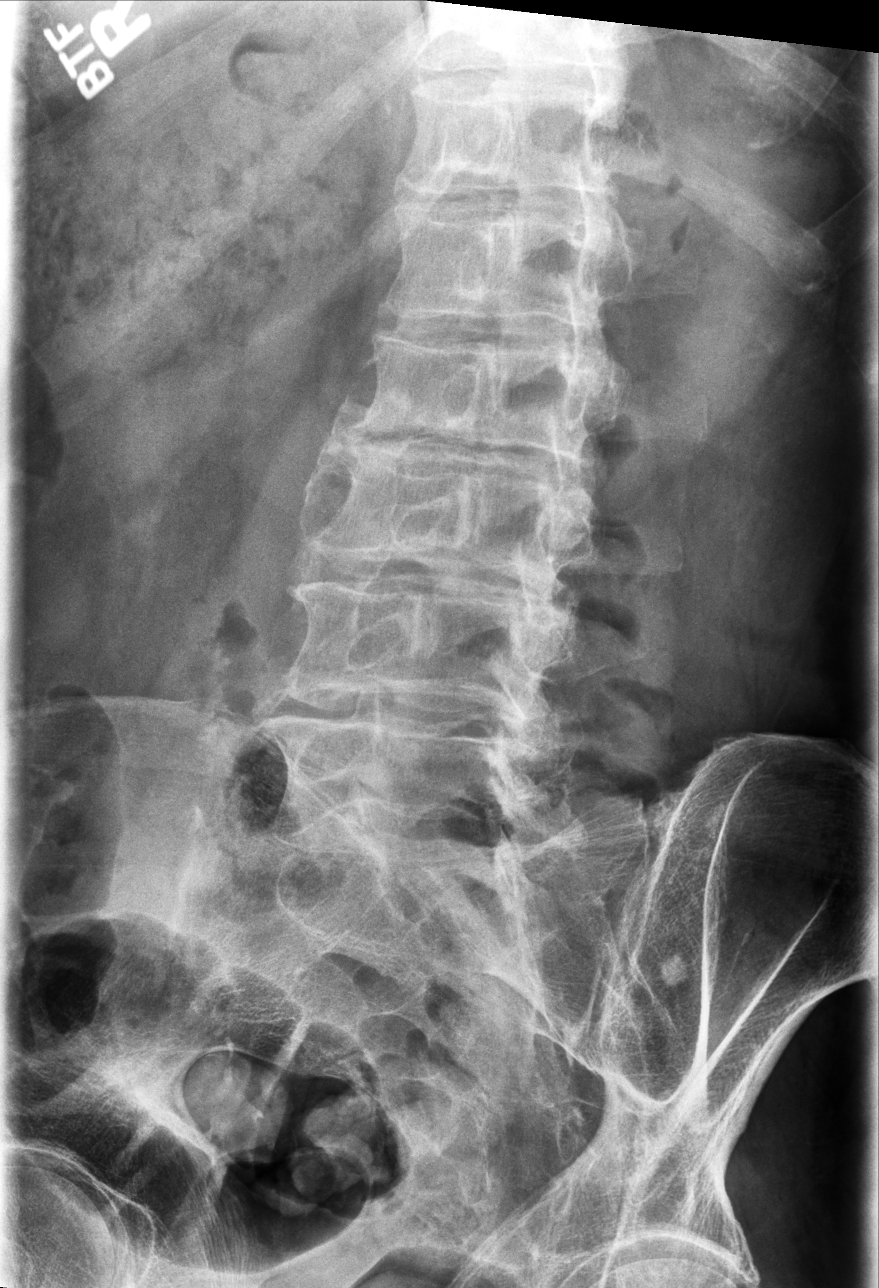

[lumbar spine oblique (2 of 2)]
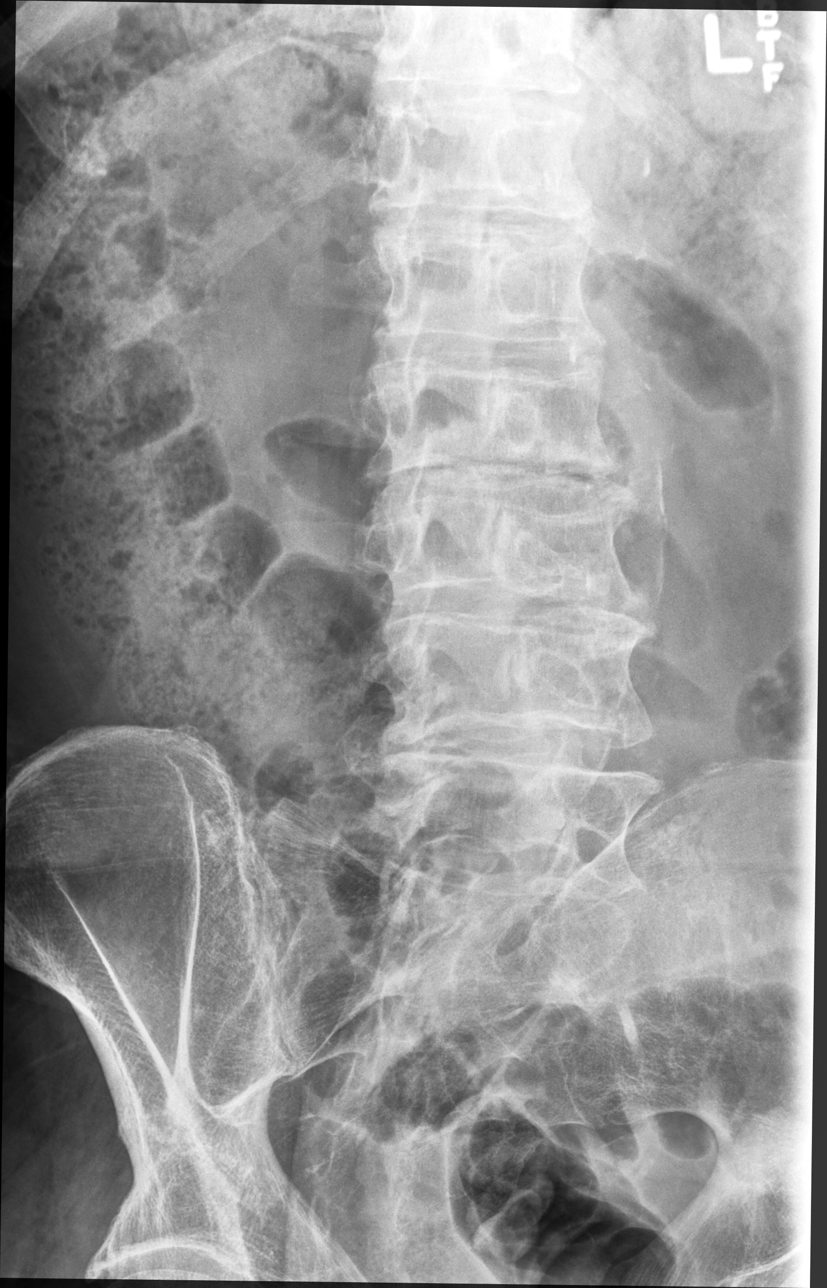

[lumbar spine lat (1 of 2)]
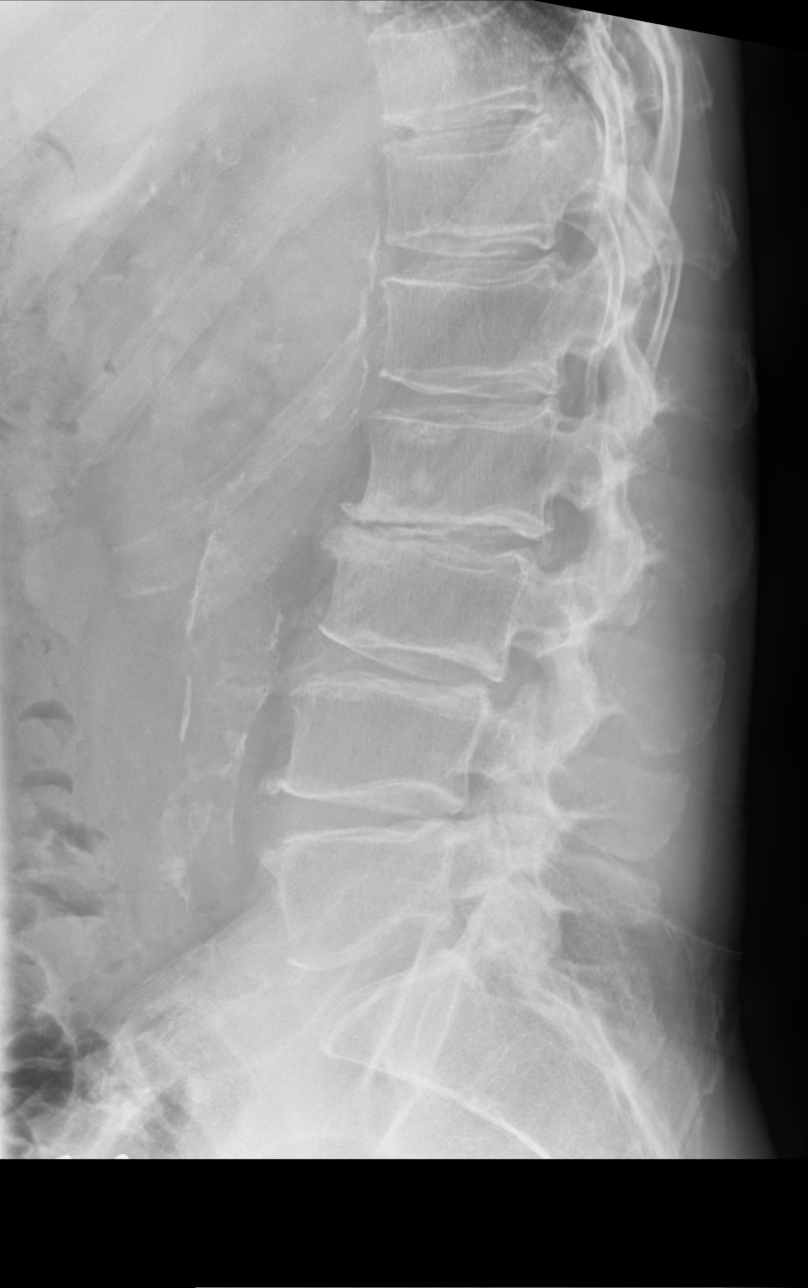

[lumbar spine lat (2 of 2)]
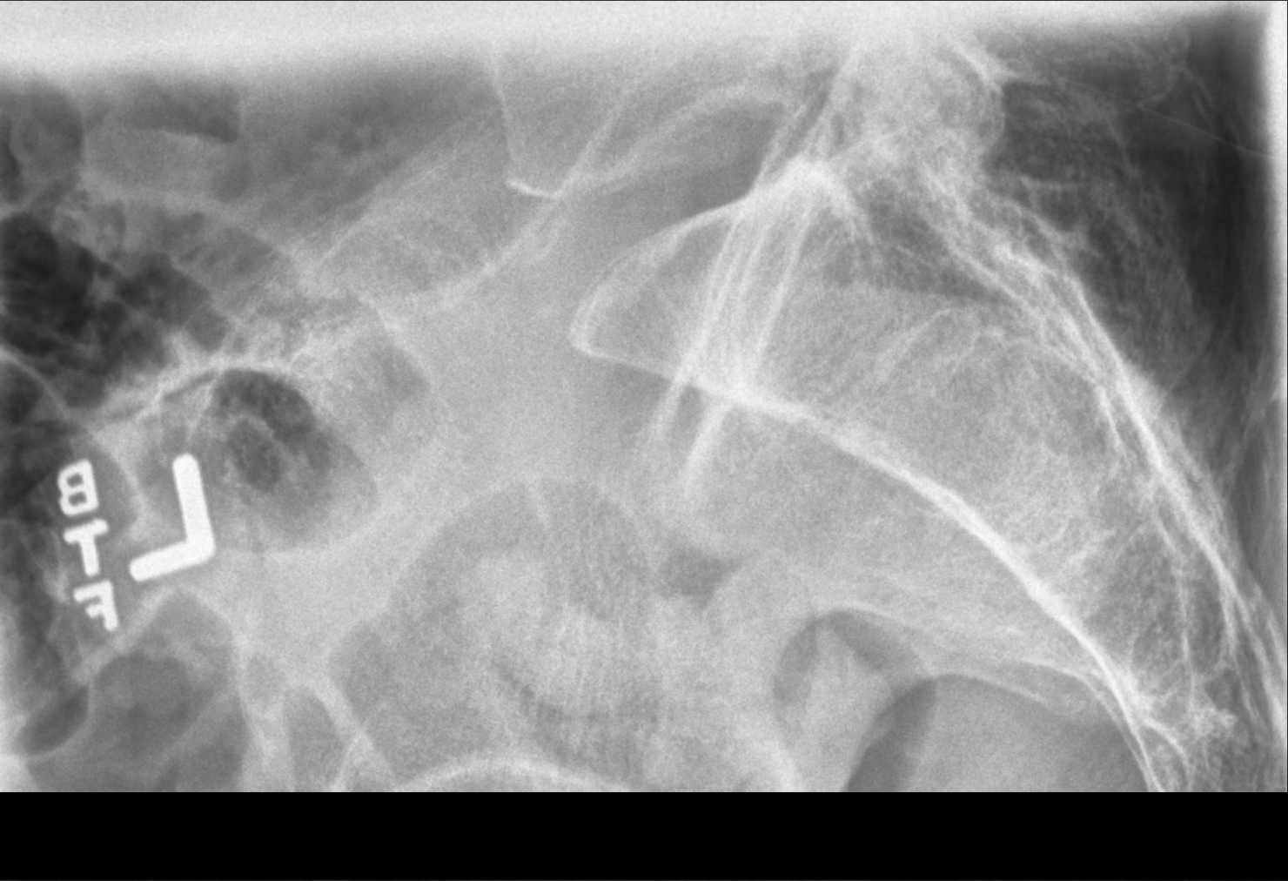

[5 of 5 positions shown; findings below may reference images not displayed]

FINDINGS: Five lumbar type vertebral bodies. No acute fracture or subluxation.
Vertebral body heights are preserved. Trace stepwise retrolisthesis
at L2-L3, L3-L4, and L4-L5 is unchanged. Moderate to severe disc
height loss at L2-L3, unchanged. Mild disc height loss of the
remaining lumbar levels is similar to prior MRI. Mild lumbar facet
arthropathy. The sacroiliac joints are intact. Aortic
atherosclerosis.
IMPRESSION: 1.  No acute osseous abnormality.
2. Stable lumbar spondylosis.

## 2020-08-05 ENCOUNTER — Other Ambulatory Visit: Payer: Self-pay | Admitting: Family Medicine

## 2020-08-05 NOTE — Telephone Encounter (Signed)
Last written:04/03/20 Last ov:07/05/20 Next ov:10/07/20 Contract:none BZM:CEYE

## 2020-08-18 ENCOUNTER — Other Ambulatory Visit: Payer: Self-pay | Admitting: Radiology

## 2020-08-20 ENCOUNTER — Ambulatory Visit (HOSPITAL_COMMUNITY)
Admission: RE | Admit: 2020-08-20 | Discharge: 2020-08-20 | Disposition: A | Discharge status: Home / Self Care | Payer: Medicare HMO | Source: Ambulatory Visit | Attending: Oncology | Admitting: Oncology

## 2020-08-20 ENCOUNTER — Encounter (HOSPITAL_COMMUNITY): Payer: Self-pay

## 2020-08-20 ENCOUNTER — Other Ambulatory Visit: Payer: Self-pay

## 2020-08-20 DIAGNOSIS — Z452 Encounter for adjustment and management of vascular access device: Secondary | ICD-10-CM | POA: Diagnosis not present

## 2020-08-20 DIAGNOSIS — Z79899 Other long term (current) drug therapy: Secondary | ICD-10-CM | POA: Insufficient documentation

## 2020-08-20 DIAGNOSIS — C679 Malignant neoplasm of bladder, unspecified: Secondary | ICD-10-CM

## 2020-08-20 DIAGNOSIS — Z8551 Personal history of malignant neoplasm of bladder: Secondary | ICD-10-CM | POA: Insufficient documentation

## 2020-08-20 DIAGNOSIS — Z87891 Personal history of nicotine dependence: Secondary | ICD-10-CM | POA: Diagnosis not present

## 2020-08-20 DIAGNOSIS — Z7982 Long term (current) use of aspirin: Secondary | ICD-10-CM | POA: Diagnosis not present

## 2020-08-20 HISTORY — PX: IR REMOVAL TUN ACCESS W/ PORT W/O FL MOD SED: IMG2290

## 2020-08-20 LAB — CBC WITH DIFFERENTIAL/PLATELET
Abs Immature Granulocytes: 0.02 10*3/uL (ref 0.00–0.07)
Basophils Absolute: 0 10*3/uL (ref 0.0–0.1)
Basophils Relative: 1 %
Eosinophils Absolute: 0.3 10*3/uL (ref 0.0–0.5)
Eosinophils Relative: 3 %
HCT: 42.9 % (ref 39.0–52.0)
Hemoglobin: 13.7 g/dL (ref 13.0–17.0)
Immature Granulocytes: 0 %
Lymphocytes Relative: 38 %
Lymphs Abs: 3 10*3/uL (ref 0.7–4.0)
MCH: 29.1 pg (ref 26.0–34.0)
MCHC: 31.9 g/dL (ref 30.0–36.0)
MCV: 91.1 fL (ref 80.0–100.0)
Monocytes Absolute: 0.5 10*3/uL (ref 0.1–1.0)
Monocytes Relative: 6 %
Neutro Abs: 4.1 10*3/uL (ref 1.7–7.7)
Neutrophils Relative %: 52 %
Platelets: 166 10*3/uL (ref 150–400)
RBC: 4.71 MIL/uL (ref 4.22–5.81)
RDW: 14.9 % (ref 11.5–15.5)
WBC: 8 10*3/uL (ref 4.0–10.5)
nRBC: 0 % (ref 0.0–0.2)

## 2020-08-20 LAB — PROTIME-INR
INR: 0.9 (ref 0.8–1.2)
Prothrombin Time: 11.8 seconds (ref 11.4–15.2)

## 2020-08-20 MED ORDER — MIDAZOLAM HCL 2 MG/2ML IJ SOLN
INTRAMUSCULAR | Status: AC | PRN
  Administered 2020-08-20 (×2): 1 mg via INTRAVENOUS

## 2020-08-20 MED ORDER — CEFAZOLIN SODIUM-DEXTROSE 2-4 GM/100ML-% IV SOLN: 2.0000 g | INTRAVENOUS | Status: AC

## 2020-08-20 MED ORDER — SODIUM CHLORIDE 0.9 % IV SOLN: INTRAVENOUS | Status: DC

## 2020-08-20 MED ORDER — LIDOCAINE-EPINEPHRINE 1 %-1:100000 IJ SOLN
INTRAMUSCULAR | Status: AC
  Filled 2020-08-20: qty 1

## 2020-08-20 MED ORDER — FENTANYL CITRATE (PF) 100 MCG/2ML IJ SOLN
INTRAMUSCULAR | Status: AC | PRN
Start: 2020-08-20 — End: 2020-08-20
  Administered 2020-08-20 (×2): 50 ug via INTRAVENOUS

## 2020-08-20 MED ORDER — CEFAZOLIN SODIUM-DEXTROSE 2-4 GM/100ML-% IV SOLN
INTRAVENOUS | Status: AC
  Administered 2020-08-20: 2 g via INTRAVENOUS
  Filled 2020-08-20: qty 100

## 2020-08-20 MED ORDER — MIDAZOLAM HCL 2 MG/2ML IJ SOLN
INTRAMUSCULAR | Status: AC
  Filled 2020-08-20: qty 2

## 2020-08-20 MED ORDER — LIDOCAINE-EPINEPHRINE 1 %-1:100000 IJ SOLN
INTRAMUSCULAR | Status: AC | PRN
  Administered 2020-08-20: 10 mL via INTRADERMAL

## 2020-08-20 MED ORDER — FENTANYL CITRATE (PF) 100 MCG/2ML IJ SOLN
INTRAMUSCULAR | Status: AC
  Filled 2020-08-20: qty 2

## 2020-08-20 NOTE — Discharge Instructions (Signed)
No changes to your home medications   Implanted Port Removal, Care After This sheet gives you information about how to care for yourself after your procedure. Your health care provider may also give you more specific instructions. If you have problems or questions, contact your health care provider. What can I expect after the procedure? After the procedure, it is common to have:  Soreness or pain near your incision.  Some swelling or bruising near your incision. Follow these instructions at home: Medicines  Take over-the-counter and prescription medicines only as told by your health care provider.  If you were prescribed an antibiotic medicine, take it as told by your health care provider. Do not stop taking the antibiotic even if you start to feel better. Bathing  Do not take baths, swim, or use a hot tub until your health care provider approves. You may remove your dressing tomorrow afternoon around 3 PM and shower.  Incision care   Follow instructions from your health care provider about how to take care of your incision. Make sure you: ? .  Leave skin glue in place. These skin closures may need to stay in place for 2 weeks or longer.  Check your incision area every day for signs of infection. Check for: ? More redness, swelling, or pain. ? More fluid or blood. ? Warmth. ? Pus or a bad smell. Driving   Do not drive for 24 hours if you were given a medicine to help you relax (sedative) during your procedure.  If you did not receive a sedative, ask your health care provider when it is safe to drive. Activity  Return to your normal activities as told by your health care provider. Ask your health care provider what activities are safe for you.  Do not lift anything that is heavier than 10 lb (4.5 kg), or the limit that you are told, until your health care provider says that it is safe.  Do not do activities that involve lifting your arms over your head. General  instructions  Do not use any products that contain nicotine or tobacco, such as cigarettes and e-cigarettes. These can delay healing. If you need help quitting, ask your health care provider.  Keep all follow-up visits as told by your health care provider. This is important. Contact a health care provider if:  You have more redness, swelling, or pain around your incision.  You have more fluid or blood coming from your incision.  Your incision feels warm to the touch.  You have pus or a bad smell coming from your incision.  You have pain that is not relieved by your pain medicine. Get help right away if you have:  A fever or chills.  Chest pain.  Difficulty breathing. Summary  After the procedure, it is common to have pain, soreness, swelling, or bruising near your incision.  If you were prescribed an antibiotic medicine, take it as told by your health care provider. Do not stop taking the antibiotic even if you start to feel better.  Do not drive for 24 hours if you were given a sedative during your procedure.  Return to your normal activities as told by your health care provider. Ask your health care provider what activities are safe for you. This information is not intended to replace advice given to you by your health care provider. Make sure you discuss any questions you have with your health care provider. Document Revised: 10/28/2017 Document Reviewed: 10/28/2017 Elsevier Patient Education  [phone]  [place].     Moderate Conscious Sedation, Adult, Care After These instructions provide you with information about caring for yourself after your procedure. Your health care provider may also give you more specific instructions. Your treatment has been planned according to current medical practices, but problems sometimes occur. Call your health care provider if you have any problems or questions after your procedure. What can I expect after the procedure? After your  procedure, it is common:  To feel sleepy for several hours.  To feel clumsy and have poor balance for several hours.  To have poor judgment for several hours.  To vomit if you eat too soon. Follow these instructions at home: For at least 24 hours after the procedure:   Do not: ? Participate in activities where you could fall or become injured. ? Drive. ? Use heavy machinery. ? Drink alcohol. ? Take sleeping pills or medicines that cause drowsiness. ? Make important decisions or sign legal documents. ? Take care of children on your own.  Rest. Eating and drinking  Follow the diet recommended by your health care provider.  If you vomit: ? Drink water, juice, or soup when you can drink without vomiting. ? Make sure you have little or no nausea before eating solid foods. General instructions  Have a responsible adult stay with you until you are awake and alert.  Take over-the-counter and prescription medicines only as told by your health care provider.  If you smoke, do not smoke without supervision.  Keep all follow-up visits as told by your health care provider. This is important. Contact a health care provider if:  You keep feeling nauseous or you keep vomiting.  You feel light-headed.  You develop a rash.  You have a fever. Get help right away if:  You have trouble breathing. This information is not intended to replace advice given to you by your health care provider. Make sure you discuss any questions you have with your health care provider. Document Revised: 08/27/2017 Document Reviewed: 01/04/2016 Elsevier Patient Education  2020 [employer].

## 2020-08-20 NOTE — H&P (Signed)
Chief Complaint: Patient was seen in consultation today for port removal at the request of Shadad,Firas N  Referring Physician(s): Wyatt Portela  Supervising Physician: Jacqulynn Cadet  Patient Status: [facility] - Out-pt  History of Present Illness: Brandon Haynes is a 79 y.o. male with hx of bladder cancer. Had port placed 12/20 and has now completed surgery and therapy. He is referred for port removal. PMHx, meds, labs, imaging, allergies reviewed. Feels well, no recent fevers, chills, illness. Has been NPO today as directed.   Past Medical History:  Diagnosis Date   Allergic rhinitis    allergy shots via [place]   Anxiety    Bladder cancer ([place]) 05/2019   INVASIVE HIGH GRADE PAPILLARY UROTHELIAL CARCINOMA, invading muscularis propia.  Lesion resected. No mets.  He got neoadjuvant chemo x 4 cycles.  Cystoprostectomy with ileal conduit formation 12/13/19 (Dr. Tresa Moore).   Bradycardia, drug induced 09/08/2016   C. difficile colitis 01/09/2020; late May 2021   oral vancomycin   CAD, multiple vessel    a. CP/near-syncope 11/2014 s/p DES to MLAD, occluded RCA after takeoff of RV marginal with collaterals treated medically, otherwise mild nonobstructive disease. EF 55%;  b. 07/2015 Lexiscan CL: EF 56%, no ischemia/infarct.    Cataracts, both eyes    Chronic constipation    worse since ileal conduit surgery was done 2020   Chronic low back pain    DDD (degenerative disc disease), lumbar    GERD (gastroesophageal reflux disease)    PPI bid needed to control sx's   Gout    Hearing loss    History  of basal cell carcinoma    History of adenomatous polyp of colon    Rpt colonoscopy recommended 02/2020->no further if no high risk lesions at that time.   Hypercholesteremia    Hypertension    Hx labile/uncontrolled.  Improved s/p visit to HTN clinic 2019.   Iron deficiency anemia 2017   Colonoscopy, EGD, and capsule endoscopy w/out obvious cause/source found.  Oral  iron helping as of 03/2016.   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 No avid enhancement or mass effect. Obs by urol as of 10/2019.    Past Surgical History:  Procedure Laterality Date   back injection  oct 2011, 07/2010, 12/2012   no surgery   Capsule endoscopy  02/2016   mild duodenitis, o/w NEG   CARDIOVASCULAR STRESS TEST  05/2009; 07/2015   carotid dopplers  11/2014   Bilateral - 1% to 9% ICA stenosis. Vertebral artery flow is   COLONOSCOPY  08/2008; 02/2016; 02/2017   2017: 12 polyps (adenomatous).  02/2017 repeat TCS--multiple polyps: recall 3 yrs.   CORONARY ANGIOPLASTY WITH STENT PLACEMENT  12/04/2014   4.0 x 12 mm Synergy stent to the mid LAD   CYSTOSCOPY  05/24/2019   CYSTOSCOPY W/ TRANSURETHRAL RESECTION OF POSTERIOR URETHERAL VALVES  06/12/2019   2-5cm   CYSTOSCOPY WITH INJECTION N/A 12/13/2019   Procedure: CYSTOSCOPY WITH INJECTION;  Surgeon: [name], MD;  Location: [facility];  Service: Urology;  Laterality: N/A;   ESOPHAGOGASTRODUODENOSCOPY  02/2016   Gastritis.  NEG h pylori.     IR IMAGING GUIDED PORT INSERTION  09/12/2019   LE arterial vascular study  02/2016   Per Dr.  Turner, no evidence of peripheral vascular disease   LEFT HEART CATHETERIZATION WITH CORONARY ANGIOGRAM N/A 12/04/2014   Procedure: LEFT HEART CATHETERIZATION WITH CORONARY ANGIOGRAM;  Surgeon: Jolaine Artist, MD; LAD 99% then aneurysmal, OM1 40%, CFX 50%, RCA 40%, 50% then occluded   LYMPHADENECTOMY Bilateral 12/13/2019   Procedure: LYMPHADENECTOMY;  Surgeon: [name], MD;  [facility];  Service: Urology;  Laterality: Bilateral;   ROBOT ASSISTED LAPAROSCOPIC COMPLETE CYSTECT ILEAL CONDUIT N/A 12/13/2019   Procedure: XI ROBOTIC ASSISTED LAPAROSCOPIC  COMPLETE CYSTECT ILEAL CONDUIT AND RADICAL PROSTATECTOMY;  Surgeon: [name], MD;  [facility];  Service: Urology;  Laterality: N/A;  6 HRS   ROTATOR CUFF REPAIR Left 09/2013   left   SHOULDER INJECTION  feb 2014   TRANSTHORACIC ECHOCARDIOGRAM  11/2014   EF 50-55%, some wall motion abnormalities noted, grade I diast dysfxn   TRANSURETHRAL RESECTION OF BLADDER TUMOR N/A 06/12/2019   INVASIVE HIGH GRADE PAPILLARY UROTHELIAL CARCINOMA, invading muscularis propria.  Procedure: TRANSURETHRAL RESECTION OF BLADDER TUMOR (TURBT);  Surgeon: Lucas Mallow, MD;  [facility];  Service: Urology;  Laterality: N/A;    Allergies: Patient has no known allergies.  Medications: Prior to Admission medications   Medication Sig Start Date End Date Taking? Authorizing Provider  allopurinol (ZYLOPRIM) 300 MG tablet TAKE 1 TABLET BY MOUTH EVERY DAY 08/05/20  Yes McGowen, [name], MD  aspirin EC 81 MG tablet Take 81 mg by mouth daily.   Yes [provider]  desonide (DESOWEN) 0.05 % cream Apply 1 application topically 3 (three) times a week.  02/11/15  Yes [provider]  diphenhydrAMINE (BENADRYL) 25 MG tablet Take 50 mg by mouth 3 (three) times daily as needed for itching or allergies.    Yes [provider]  fluticasone (FLONASE) 50 MCG/ACT nasal spray Place 1 spray into both nostrils daily.   Yes [provider]  gabapentin (NEURONTIN) 300 MG capsule Take 300-600 mg by mouth See admin instructions. Take 2 capsules (600 mg) by mouth in the morning & take 1 capsule (300 mg) by mouth in the afternoon.   Yes [provider]  loratadine (CLARITIN) 10 MG tablet Take 20 mg by mouth daily.    Yes [provider]  metoprolol tartrate (LOPRESSOR) 25 MG tablet Take 0.5 tablets (12.5 mg total) by mouth 2 (two) times daily. 02/26/20  Yes Alma Friendly, MD  pantoprazole (PROTONIX) 40 MG tablet TAKE 1 TABLET BY MOUTH EVERY DAY 07/29/20  Yes  Armbruster, Carlota Raspberry, MD  PREVIDENT 5000 BOOSTER PLUS 1.1 % PSTE Take 1 application by mouth at bedtime.  10/27/14  Yes [provider]  rosuvastatin (CRESTOR) 20 MG tablet TAKE 1 TABLET BY MOUTH EVERY DAY 08/05/20  Yes McGowen, [name], MD  traMADol (ULTRAM) 50 MG tablet TAKE 1 TABLET BY MOUTH 3 TIMES A DAY AS NEEDED FOR PAIN 08/05/20  Yes McGowen, [name], MD  amLODipine (NORVASC) 5 MG tablet Take 5 mg by mouth daily.     [provider]  EPINEPHrine 0.3 mg/0.3 mL IJ SOAJ injection Inject 0.3 mg into the muscle as needed for anaphylaxis.  10/04/17   [provider]  losartan (COZAAR) 50 MG tablet Take 1 tablet (50 mg total) by mouth daily. Patient not taking: Reported on 07/05/2020 05/30/20   Sueanne Margarita, MD  nitroGLYCERIN (NITROSTAT) 0.4 MG SL tablet PLACE 1 TABLET UNDER TONGUE EVERY 5 MINS, UP TO 3 DOSES AS NEEDED FOR CHEST PAIN 12/28/19  Sueanne Margarita, MD     Family History  Problem Relation Age of Onset   Lung cancer Father    Heart disease Father    Breast cancer Mother    Congestive Heart Failure Mother    Diabetes Mother    Breast cancer Sister    Heart disease Brother    COPD Brother    Bladder Cancer Maternal Grandfather    Colon cancer Neg Hx    Esophageal cancer Neg Hx    Pancreatic cancer Neg Hx    Rectal cancer Neg Hx    Stomach cancer Neg Hx    Prostate cancer Neg Hx    Colon polyps Neg Hx     Social History   Socioeconomic History   Marital status: Married    Spouse name: Vaughan Basta   Number of children: 3   Years of education: Not on file   Highest education level: Not on file  Occupational History   Occupation: retired  Tobacco Use   Smoking status: Former Smoker    Packs/day: 2.00    Years: 35.00    Pack years: 70.00    Types: Cigarettes    Quit date: 09/28/1998    Years since quitting: 21.9   Smokeless tobacco: Former Systems developer    Types: Snuff    Quit date: 02/09/1986   Tobacco comment: as a teenager    Vaping Use   Vaping Use: Never used  Substance and Sexual Activity   Alcohol use: No    Alcohol/week: 0.0 standard drinks    Comment: quit 1994   Drug use: No   Sexual activity: Not Currently  Other Topics Concern   Not on file  Social History Narrative   Married, 3 children.   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   Orig from [place], lived [place] same farm all his life.   Former smoker: quit approx around [place].  80 pack-yr hx.   Alcohol-none, used to drink a lot of beer on weekends.  No drugs.   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   Diet: fair   Social Determinants of Health   Financial Resource Strain:    Difficulty of Paying Living Expenses: Not on file  Food Insecurity:    Worried About Charity fundraiser in the Last Year: Not on file   [employer] of Food in the Last Year: Not on file  Transportation Needs:    Lack of Transportation (Medical): Not on file   Lack of Transportation (Non-Medical): Not on file  Physical Activity:    Days of Exercise per Week: Not on file   Minutes of Exercise per Session: Not on file  Stress:    Feeling of Stress : Not on file  Social Connections:    Frequency of Communication with Friends and Family: Not on file   Frequency of Social Gatherings with Friends and Family: Not on file   Attends Religious Services: Not on file   Active Member of Clubs or Organizations: Not on file   Attends Archivist Meetings: Not on file   Marital Status: Not on file     Review of Systems: A 12 point ROS discussed and pertinent positives are indicated in the HPI above.  All other systems are negative.  Review of Systems  Vital Signs: BP 127/73 (BP Location: Left Arm)    Pulse (!) 59    Temp 97.7 F (36.5 C) (Oral)    SpO2 100%  Physical Exam Constitutional:      Appearance: Normal appearance.  HENT:     Mouth/Throat:     Mouth: Mucous membranes are moist.     Pharynx: Oropharynx is  clear.  Cardiovascular:     Rate and Rhythm: Normal rate and regular rhythm.     Heart sounds: Normal heart sounds.  Pulmonary:     Effort: Pulmonary effort is normal. No respiratory distress.     Breath sounds: Normal breath sounds.  Skin:    General: Skin is warm and dry.     Comments: (R)upper chest port palpable. Incision well healed  Neurological:     General: No focal deficit present.     Mental Status: He is alert and oriented to person, place, and time.  Psychiatric:        Mood and Affect: Mood normal.        Thought Content: Thought content normal.        Judgment: Judgment normal.      Imaging: No results found.  Labs:  CBC: Recent Labs    02/23/20 0323 02/24/20 0506 02/25/20 0400 07/31/20 1005  WBC 16.8* 12.3* 8.9 7.5  HGB 12.0* 11.1* 10.8* 12.1*  HCT 38.0* 35.1* 34.0* 38.0*  PLT 264 272 281 146*    COAGS: Recent Labs    09/12/19 1015 02/19/20 1547 02/20/20 0431  INR 0.9 1.1 1.2  APTT  --  28 28    BMP: Recent Labs    02/22/20 0322 02/22/20 0322 02/23/20 0323 02/24/20 0506 02/25/20 0400 07/31/20 1005  NA 133*   < > 133* 133* 137 142  K 3.9   < > 3.7 3.0* 3.9 4.0  CL 102   < > 104 110 109 108  CO2 20*   < > 22 18* 22 29  GLUCOSE 111*   < > 105* 72 101* 84  BUN 22   < > 21 16 15 15   CALCIUM 7.7*   < > 7.8* 6.3* 7.7* 8.9  CREATININE 1.02   < > 1.05 0.74 0.92 1.07  GFRNONAA >60   < > >60 >60 >60 >60  GFRAA >60  --  >60 >60 >60  --    < > = values in this interval not displayed.    LIVER FUNCTION TESTS: Recent Labs    02/23/20 0323 02/23/20 0323 02/24/20 0506 02/25/20 0400 03/20/20 0823 07/31/20 1005  BILITOT 0.1*  --  0.4 0.6  --  0.5  AST 65*  --  39 59*  --  17  ALT 34   < > 27 39 8 14  ALKPHOS 58  --  45 54  --  50  PROT 4.7*  --  3.6* 4.4*  --  6.7  ALBUMIN 2.2*  --  1.6* 2.1*  --  4.0   < > = values in this interval not displayed.    TUMOR MARKERS: No results for input(s): AFPTM, CEA, CA199, CHROMGRNA in the last  8760 hours.  Assessment and Plan: Hx of bladder cancer For port removal. Risks and benefits of port-a-catheter removal was discussed with the patient including, but not limited to bleeding, infection, pneumothorax, or fibrin sheath development and need for additional procedures.  All of the patient's questions were answered, patient is agreeable to proceed. Consent signed and in chart.    Thank you for this interesting consult.  I greatly enjoyed meeting DAMARIEN NYMAN and look forward to participating in their care.  A copy of this report  was sent to the requesting provider on this date.  Electronically Signed: Ascencion Dike, PA-C 08/20/2020, 1:57 PM   I spent a total of 20 minutes in face to face in clinical consultation, greater than 50% of which was counseling/coordinating care for port removal.

## 2020-08-20 NOTE — Procedures (Signed)
Interventional Radiology Procedure Note  Procedure: Portacatheter removal  Complications: None  Estimated Blood Loss: None  Recommendations: - DC home   Signed,  Cailan Antonucci K. Rey Dansby, MD   

## 2020-09-02 ENCOUNTER — Other Ambulatory Visit: Payer: Self-pay | Admitting: Gastroenterology

## 2020-10-02 DIAGNOSIS — J301 Allergic rhinitis due to pollen: Secondary | ICD-10-CM | POA: Diagnosis not present

## 2020-10-02 DIAGNOSIS — J3089 Other allergic rhinitis: Secondary | ICD-10-CM | POA: Diagnosis not present

## 2020-10-07 ENCOUNTER — Ambulatory Visit: Payer: Medicare HMO | Admitting: Family Medicine

## 2020-10-09 DIAGNOSIS — J301 Allergic rhinitis due to pollen: Secondary | ICD-10-CM | POA: Diagnosis not present

## 2020-10-09 DIAGNOSIS — J3089 Other allergic rhinitis: Secondary | ICD-10-CM | POA: Diagnosis not present

## 2020-10-16 ENCOUNTER — Other Ambulatory Visit: Payer: Self-pay

## 2020-10-16 NOTE — Progress Notes (Signed)
OFFICE VISIT  10/17/2020  CC:  Chief Complaint  Patient presents with  . Follow-up    RCI, pt is not fasting   HPI:    Patient is a 80 y.o. Caucasian male who presents for 3 mo f/u HTN, HLD, chronic pain syndrome. A/P as of last visit: "1) HTN: bp's good lately, with tendency to feel like bp too low if he takes his prn losartan. No further prn losartan at this time. Restart 1/2 of 25mg  lopressor bid and see if bp consistently <130 and no sx's after taking it. He'll be getting blood work 07/31/20 at oncologist's office.  2) HLD: tolerating rosuva 20mg  qd. LDL 37 June this year. Plan recheck after 08/2020.  3) Chronic pain syndrome. Control is good. No new pain med rx needed. CSC updated today."  INTERIM HX: Feeling well. Getting cataract surgery later this month.  BP: daily home checks consistently <130.  Takes 1/2 of 25mg  lopressor bid consistently, occ greater than 130 and he takes 1/2 losartan and this is effective.  No dizziness or acute fatigue. Rarely has to add amlodipine 5mg . Eating lots more calories and salt last couple months.  HLD: takes rosuva 20mg  qd. No side effects.      Indication for chronic opioid:chronic low back pain (DDD) andR side sciatica pain. Gabapentin via [place] [facility].  Scheduled 11/2020 for ESI. Requires 2-3 tramadol pills per day to keep the pain at minimal level. Opioids r'xd to maximize functioning and quality of life. Pt has gotten periodic ESI but has no hx of back surgery Medication and dose:tramadol 50mg , 1tid prn # pills per month: 90 PMP AWARE reviewed today: most recent rx for tramadol was filled 09/02/20, # 86, rx by me. No red flags.  ROS: no fevers, no CP, no SOB, no wheezing, no cough, no dizziness, no HAs, no rashes, no melena/hematochezia.  No polyuria or polydipsia.  No focal weakness, paresthesias, or tremors.  No acute vision or hearing abnormalities. No n/v/d or abd pain.  No palpitations.   Past Medical History:   Diagnosis Date  . Allergic rhinitis    allergy shots via [place]  . Anxiety   . Bladder cancer ([place]) 05/2019   INVASIVE HIGH GRADE PAPILLARY UROTHELIAL CARCINOMA, invading muscularis propia.  Lesion resected. No mets.  He got neoadjuvant chemo x 4 cycles.  Cystoprostectomy with ileal conduit formation 12/13/19 (Dr. Tresa Moore).  . 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 C. difficile colitis 01/09/2020; late May 2021   oral vancomycin  . CAD, multiple vessel    a. CP/near-syncope 11/2014 s/p DES to MLAD, occluded RCA after takeoff of RV marginal with collaterals treated medically, otherwise mild nonobstructive disease. EF 55%;  b. 07/2015 Lexiscan CL: EF 56%, no ischemia/infarct.   . Cataracts, both eyes   . Chronic constipation    worse since ileal conduit surgery was done 2020  . Chronic low back pain   . DDD (degenerative disc disease), lumbar   . GERD (gastroesophageal reflux disease)    PPI bid needed to control sx's  . Gout   . Hearing loss   . History  of basal cell carcinoma   . History of adenomatous polyp of colon    Rpt colonoscopy recommended 02/2020->no further if no high risk lesions at that time.  . Hypercholesteremia   . Hypertension    Hx labile/uncontrolled.  Improved s/p visit to HTN clinic 2019.  . Iron deficiency anemia 2017   Colonoscopy, EGD, and capsule endoscopy w/out obvious cause/source found.  Oral iron helping as of 03/2016.  . Leg pain 03/06/2016  . Nonmelanoma skin cancer   . 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 Peripheral vascular disease ([place])   . Pleural thickening    chronic  . 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 No avid enhancement or mass effect. Obs by urol as of 10/2019.    Past Surgical History:  Procedure Laterality Date  . back injection  oct 2011, 07/2010, 12/2012   no surgery  . Capsule endoscopy  02/2016   mild duodenitis, o/w NEG  . CARDIOVASCULAR STRESS TEST  05/2009;  07/2015  . carotid dopplers  11/2014   Bilateral - 1% to 9% ICA stenosis. Vertebral artery flow is  . COLONOSCOPY  08/2008; 02/2016; 02/2017   2017: 12 polyps (adenomatous).  02/2017 repeat TCS--multiple polyps: recall 3 yrs.  . CORONARY ANGIOPLASTY WITH STENT PLACEMENT  12/04/2014   4.0 x 12 mm Synergy stent to the mid LAD  . CYSTOSCOPY  05/24/2019  . CYSTOSCOPY W/ TRANSURETHRAL RESECTION OF POSTERIOR URETHERAL VALVES  06/12/2019   2-5cm  . CYSTOSCOPY WITH INJECTION N/A 12/13/2019   Procedure: CYSTOSCOPY WITH INJECTION;  Surgeon: [name], MD;  [facility];  Service: Urology;  Laterality: N/A;  . ESOPHAGOGASTRODUODENOSCOPY  02/2016   Gastritis.  NEG h pylori.    . IR IMAGING GUIDED PORT INSERTION  09/12/2019  . IR REMOVAL TUN ACCESS W/ PORT W/O FL MOD SED  08/20/2020  . LE arterial vascular study  02/2016   Per Dr. Radford Pax, no evidence of peripheral vascular disease  . LEFT HEART CATHETERIZATION WITH CORONARY ANGIOGRAM N/A 12/04/2014   Procedure: LEFT HEART CATHETERIZATION WITH CORONARY ANGIOGRAM;  Surgeon: Jolaine Artist, MD; LAD 99% then aneurysmal, OM1 40%, CFX 50%, RCA 40%, 50% then occluded  . LYMPHADENECTOMY Bilateral 12/13/2019   Procedure: LYMPHADENECTOMY;  Surgeon: [name], MD;  [facility];  Service: Urology;  Laterality: Bilateral;  . ROBOT ASSISTED LAPAROSCOPIC COMPLETE CYSTECT ILEAL CONDUIT N/A 12/13/2019   Procedure: XI ROBOTIC ASSISTED LAPAROSCOPIC COMPLETE CYSTECT ILEAL CONDUIT AND RADICAL PROSTATECTOMY;  Surgeon: [name], MD;  [facility];  Service: Urology;  Laterality: N/A;  6 HRS  . ROTATOR CUFF REPAIR Left 09/2013   left  . SHOULDER INJECTION  feb 2014  . TRANSTHORACIC ECHOCARDIOGRAM  11/2014   EF 50-55%, some wall motion abnormalities noted, grade I diast dysfxn  . TRANSURETHRAL RESECTION OF BLADDER TUMOR N/A 06/12/2019   INVASIVE HIGH GRADE PAPILLARY UROTHELIAL CARCINOMA, invading muscularis propria.  Procedure: TRANSURETHRAL RESECTION  OF BLADDER TUMOR (TURBT);  Surgeon: Lucas Mallow, MD;  [facility];  Service: Urology;  Laterality: N/A;    Outpatient Medications Prior to Visit  Medication Sig Dispense Refill  . allopurinol (ZYLOPRIM) 300 MG tablet TAKE 1 TABLET BY MOUTH EVERY DAY 90 tablet 3  . amLODipine (NORVASC) 5 MG tablet Take 5 mg by mouth daily.     Marland Kitchen aspirin EC 81 MG tablet Take 81 mg by mouth daily.    Marland Kitchen desonide (DESOWEN) 0.05 % cream Apply 1 application topically 3 (three) times a week.   1  . diphenhydrAMINE (BENADRYL) 25 MG tablet Take 50 mg by mouth 3 (three) times daily as needed for itching or allergies.     . fluticasone (FLONASE) 50 MCG/ACT nasal spray Place 1 spray into both nostrils daily.    Marland Kitchen gabapentin (NEURONTIN) 300 MG capsule Take 300-600 mg by mouth  See admin instructions. Take 2 capsules (600 mg) by mouth in the morning & take 1 capsule (300 mg) by mouth in the afternoon.    . loratadine (CLARITIN) 10 MG tablet Take 20 mg by mouth daily.    . metoprolol tartrate (LOPRESSOR) 25 MG tablet Take 0.5 tablets (12.5 mg total) by mouth 2 (two) times daily. 180 tablet 3  . pantoprazole (PROTONIX) 40 MG tablet TAKE 1 TABLET BY MOUTH EVERY DAY 90 tablet 1  . PREVIDENT 5000 BOOSTER PLUS 1.1 % PSTE Take 1 application by mouth at bedtime.   1  . rosuvastatin (CRESTOR) 20 MG tablet TAKE 1 TABLET BY MOUTH EVERY DAY 90 tablet 3  . traMADol (ULTRAM) 50 MG tablet TAKE 1 TABLET BY MOUTH 3 TIMES A DAY AS NEEDED FOR PAIN 90 tablet 5  . EPINEPHrine 0.3 mg/0.3 mL IJ SOAJ injection Inject 0.3 mg into the muscle as needed for anaphylaxis.  (Patient not taking: Reported on 10/17/2020)    . losartan (COZAAR) 50 MG tablet Take 1 tablet (50 mg total) by mouth daily. (Patient not taking: No sig reported) 90 tablet 3  . nitroGLYCERIN (NITROSTAT) 0.4 MG SL tablet PLACE 1 TABLET UNDER TONGUE EVERY 5 MINS, UP TO 3 DOSES AS NEEDED FOR CHEST PAIN (Patient not taking: Reported on 10/17/2020) 25 tablet 1   No  facility-administered medications prior to visit.    No Known Allergies  ROS As per HPI  PE: Vitals with BMI 10/17/2020 08/20/2020 08/20/2020  Height 6\' 2"  - -  Weight 204 lbs 3 oz - -  BMI [id number] - -  Systolic [id number]  Diastolic 75 78 86  Pulse 76 62 67   Gen: Alert, well appearing.  Patient is oriented to person, place, time, and situation. AFFECT: pleasant, lucid thought and speech. CV: RRR, no m/r/g.   LUNGS: CTA bilat, nonlabored resps, good aeration in all lung fields. EXT: no clubbing or cyanosis.  no edema.    LABS:  Lab Results  Component Value Date   TSH 1.946 01/08/2020   Lab Results  Component Value Date   WBC 8.0 08/20/2020   HGB 13.7 08/20/2020   HCT 42.9 08/20/2020   MCV 91.1 08/20/2020   PLT 166 08/20/2020   Lab Results  Component Value Date   CREATININE 1.07 07/31/2020   BUN 15 07/31/2020   NA 142 07/31/2020   K 4.0 07/31/2020   CL 108 07/31/2020   CO2 29 07/31/2020   Lab Results  Component Value Date   ALT 14 07/31/2020   AST 17 07/31/2020   ALKPHOS 50 07/31/2020   BILITOT 0.5 07/31/2020   Lab Results  Component Value Date   CHOL 116 03/20/2020   Lab Results  Component Value Date   HDL 62 03/20/2020   Lab Results  Component Value Date   LDLCALC 37 03/20/2020   Lab Results  Component Value Date   TRIG 85 03/20/2020   Lab Results  Component Value Date   CHOLHDL 1.9 03/20/2020   Lab Results  Component Value Date   PSA <0.015 06/04/2020   PSA 2.13 03/22/2014   PSA 1.77 02/21/2013   Lab Results  Component Value Date   HGBA1C 5.2 04/05/2019    IMPRESSION AND PLAN:  1) HTN: good control on current regimen of 1/2 of 25mg  lopressor bid, plus 1/2 of 50mg  losartan prn bp >130 and then 5mg  amlod prn bp>140 (Rarely has to take his losartan or amlod). Lytes/cr good 07/2020. Plan repeat 3  mo.  2) HLD, goal LDL <70: tolerating rosuva 20mg  qd and lipid levels excellent 02/2020. Plan rpt flp and hepatic panel 3 mo.  3)  Chronic pain syndrome (bilat LBP with R sided sciatica), stable. Cont tramadol 50mg , 1 tid prn. Gabapentin and periodic ESIs via neurosurgeon.  An After Visit Summary was printed and given to the patient.  FOLLOW UP: Return in about 3 months (around 01/15/2021) for annual CPE (fasting).  Signed:  Crissie Sickles, MD           10/17/2020

## 2020-10-17 ENCOUNTER — Encounter: Payer: Self-pay | Admitting: Family Medicine

## 2020-10-17 ENCOUNTER — Ambulatory Visit: Payer: Medicare HMO | Admitting: Family Medicine

## 2020-10-17 VITALS — BP 123/75 | HR 76 | Temp 97.5°F | Resp 16 | Ht 74.0 in | Wt 204.2 lb

## 2020-10-17 DIAGNOSIS — I10 Essential (primary) hypertension: Secondary | ICD-10-CM

## 2020-10-17 DIAGNOSIS — G894 Chronic pain syndrome: Secondary | ICD-10-CM | POA: Diagnosis not present

## 2020-10-17 DIAGNOSIS — G8929 Other chronic pain: Secondary | ICD-10-CM | POA: Diagnosis not present

## 2020-10-17 DIAGNOSIS — E7800 Pure hypercholesterolemia, unspecified: Secondary | ICD-10-CM

## 2020-10-17 DIAGNOSIS — M5441 Lumbago with sciatica, right side: Secondary | ICD-10-CM

## 2020-10-18 DIAGNOSIS — J301 Allergic rhinitis due to pollen: Secondary | ICD-10-CM | POA: Diagnosis not present

## 2020-10-18 DIAGNOSIS — J3089 Other allergic rhinitis: Secondary | ICD-10-CM | POA: Diagnosis not present

## 2020-10-18 DIAGNOSIS — J3081 Allergic rhinitis due to animal (cat) (dog) hair and dander: Secondary | ICD-10-CM | POA: Diagnosis not present

## 2020-10-23 DIAGNOSIS — J3089 Other allergic rhinitis: Secondary | ICD-10-CM | POA: Diagnosis not present

## 2020-10-23 DIAGNOSIS — J301 Allergic rhinitis due to pollen: Secondary | ICD-10-CM | POA: Diagnosis not present

## 2020-10-24 DIAGNOSIS — H2512 Age-related nuclear cataract, left eye: Secondary | ICD-10-CM | POA: Diagnosis not present

## 2020-10-30 DIAGNOSIS — J301 Allergic rhinitis due to pollen: Secondary | ICD-10-CM | POA: Diagnosis not present

## 2020-10-30 DIAGNOSIS — J3089 Other allergic rhinitis: Secondary | ICD-10-CM | POA: Diagnosis not present

## 2020-11-04 DIAGNOSIS — J301 Allergic rhinitis due to pollen: Secondary | ICD-10-CM | POA: Diagnosis not present

## 2020-11-04 DIAGNOSIS — J3089 Other allergic rhinitis: Secondary | ICD-10-CM | POA: Diagnosis not present

## 2020-11-06 DIAGNOSIS — J3089 Other allergic rhinitis: Secondary | ICD-10-CM | POA: Diagnosis not present

## 2020-11-06 DIAGNOSIS — J301 Allergic rhinitis due to pollen: Secondary | ICD-10-CM | POA: Diagnosis not present

## 2020-11-07 DIAGNOSIS — Z936 Other artificial openings of urinary tract status: Secondary | ICD-10-CM | POA: Diagnosis not present

## 2020-11-07 DIAGNOSIS — Z8551 Personal history of malignant neoplasm of bladder: Secondary | ICD-10-CM | POA: Diagnosis not present

## 2020-11-12 DIAGNOSIS — J3089 Other allergic rhinitis: Secondary | ICD-10-CM | POA: Diagnosis not present

## 2020-11-12 DIAGNOSIS — C672 Malignant neoplasm of lateral wall of bladder: Secondary | ICD-10-CM | POA: Diagnosis not present

## 2020-11-12 DIAGNOSIS — C61 Malignant neoplasm of prostate: Secondary | ICD-10-CM | POA: Diagnosis not present

## 2020-11-12 DIAGNOSIS — J301 Allergic rhinitis due to pollen: Secondary | ICD-10-CM | POA: Diagnosis not present

## 2020-11-12 LAB — PSA: PSA: <0.015

## 2020-11-18 ENCOUNTER — Ambulatory Visit (HOSPITAL_COMMUNITY)
Admission: RE | Admit: 2020-11-18 | Discharge: 2020-11-18 | Disposition: A | Discharge status: Home / Self Care | Payer: Medicare HMO | Source: Ambulatory Visit | Attending: Urology | Admitting: Urology

## 2020-11-18 ENCOUNTER — Other Ambulatory Visit: Payer: Self-pay

## 2020-11-18 ENCOUNTER — Other Ambulatory Visit (HOSPITAL_COMMUNITY): Payer: Self-pay | Admitting: Urology

## 2020-11-18 DIAGNOSIS — K449 Diaphragmatic hernia without obstruction or gangrene: Secondary | ICD-10-CM | POA: Diagnosis not present

## 2020-11-18 DIAGNOSIS — J3089 Other allergic rhinitis: Secondary | ICD-10-CM | POA: Diagnosis not present

## 2020-11-18 DIAGNOSIS — C672 Malignant neoplasm of lateral wall of bladder: Secondary | ICD-10-CM

## 2020-11-18 DIAGNOSIS — C679 Malignant neoplasm of bladder, unspecified: Secondary | ICD-10-CM | POA: Diagnosis not present

## 2020-11-18 DIAGNOSIS — N2889 Other specified disorders of kidney and ureter: Secondary | ICD-10-CM | POA: Diagnosis not present

## 2020-11-18 DIAGNOSIS — J301 Allergic rhinitis due to pollen: Secondary | ICD-10-CM | POA: Diagnosis not present

## 2020-11-18 DIAGNOSIS — R918 Other nonspecific abnormal finding of lung field: Secondary | ICD-10-CM | POA: Diagnosis not present

## 2020-11-18 DIAGNOSIS — I700 Atherosclerosis of aorta: Secondary | ICD-10-CM | POA: Diagnosis not present

## 2020-11-18 DIAGNOSIS — Z8546 Personal history of malignant neoplasm of prostate: Secondary | ICD-10-CM | POA: Diagnosis not present

## 2020-11-25 DIAGNOSIS — J3089 Other allergic rhinitis: Secondary | ICD-10-CM | POA: Diagnosis not present

## 2020-11-25 DIAGNOSIS — J301 Allergic rhinitis due to pollen: Secondary | ICD-10-CM | POA: Diagnosis not present

## 2020-11-25 DIAGNOSIS — H2511 Age-related nuclear cataract, right eye: Secondary | ICD-10-CM | POA: Diagnosis not present

## 2020-11-28 DIAGNOSIS — H2511 Age-related nuclear cataract, right eye: Secondary | ICD-10-CM | POA: Diagnosis not present

## 2020-12-02 DIAGNOSIS — J3089 Other allergic rhinitis: Secondary | ICD-10-CM | POA: Diagnosis not present

## 2020-12-02 DIAGNOSIS — Z936 Other artificial openings of urinary tract status: Secondary | ICD-10-CM | POA: Diagnosis not present

## 2020-12-02 DIAGNOSIS — C61 Malignant neoplasm of prostate: Secondary | ICD-10-CM | POA: Diagnosis not present

## 2020-12-02 DIAGNOSIS — J301 Allergic rhinitis due to pollen: Secondary | ICD-10-CM | POA: Diagnosis not present

## 2020-12-02 DIAGNOSIS — C672 Malignant neoplasm of lateral wall of bladder: Secondary | ICD-10-CM | POA: Diagnosis not present

## 2020-12-02 DIAGNOSIS — R8271 Bacteriuria: Secondary | ICD-10-CM | POA: Diagnosis not present

## 2020-12-05 DIAGNOSIS — M48062 Spinal stenosis, lumbar region with neurogenic claudication: Secondary | ICD-10-CM | POA: Diagnosis not present

## 2020-12-12 DIAGNOSIS — J301 Allergic rhinitis due to pollen: Secondary | ICD-10-CM | POA: Diagnosis not present

## 2020-12-12 DIAGNOSIS — J3089 Other allergic rhinitis: Secondary | ICD-10-CM | POA: Diagnosis not present

## 2020-12-16 ENCOUNTER — Encounter: Payer: Self-pay | Admitting: Family Medicine

## 2020-12-17 DIAGNOSIS — J301 Allergic rhinitis due to pollen: Secondary | ICD-10-CM | POA: Diagnosis not present

## 2020-12-17 DIAGNOSIS — J3089 Other allergic rhinitis: Secondary | ICD-10-CM | POA: Diagnosis not present

## 2020-12-24 DIAGNOSIS — J3089 Other allergic rhinitis: Secondary | ICD-10-CM | POA: Diagnosis not present

## 2020-12-24 DIAGNOSIS — J301 Allergic rhinitis due to pollen: Secondary | ICD-10-CM | POA: Diagnosis not present

## 2020-12-31 DIAGNOSIS — J3089 Other allergic rhinitis: Secondary | ICD-10-CM | POA: Diagnosis not present

## 2020-12-31 DIAGNOSIS — J301 Allergic rhinitis due to pollen: Secondary | ICD-10-CM | POA: Diagnosis not present

## 2021-01-07 DIAGNOSIS — J301 Allergic rhinitis due to pollen: Secondary | ICD-10-CM | POA: Diagnosis not present

## 2021-01-07 DIAGNOSIS — M5136 Other intervertebral disc degeneration, lumbar region: Secondary | ICD-10-CM | POA: Diagnosis not present

## 2021-01-07 DIAGNOSIS — M5416 Radiculopathy, lumbar region: Secondary | ICD-10-CM | POA: Diagnosis not present

## 2021-01-07 DIAGNOSIS — J3089 Other allergic rhinitis: Secondary | ICD-10-CM | POA: Diagnosis not present

## 2021-01-14 DIAGNOSIS — J3089 Other allergic rhinitis: Secondary | ICD-10-CM | POA: Diagnosis not present

## 2021-01-14 DIAGNOSIS — J301 Allergic rhinitis due to pollen: Secondary | ICD-10-CM | POA: Diagnosis not present

## 2021-01-15 ENCOUNTER — Encounter: Payer: Medicare HMO | Admitting: Family Medicine

## 2021-01-20 DIAGNOSIS — Z936 Other artificial openings of urinary tract status: Secondary | ICD-10-CM | POA: Diagnosis not present

## 2021-01-20 DIAGNOSIS — Z8551 Personal history of malignant neoplasm of bladder: Secondary | ICD-10-CM | POA: Diagnosis not present

## 2021-01-21 DIAGNOSIS — J301 Allergic rhinitis due to pollen: Secondary | ICD-10-CM | POA: Diagnosis not present

## 2021-01-21 DIAGNOSIS — J3081 Allergic rhinitis due to animal (cat) (dog) hair and dander: Secondary | ICD-10-CM | POA: Diagnosis not present

## 2021-01-21 DIAGNOSIS — J3089 Other allergic rhinitis: Secondary | ICD-10-CM | POA: Diagnosis not present

## 2021-01-24 DIAGNOSIS — Z8551 Personal history of malignant neoplasm of bladder: Secondary | ICD-10-CM | POA: Diagnosis not present

## 2021-01-24 DIAGNOSIS — Z936 Other artificial openings of urinary tract status: Secondary | ICD-10-CM | POA: Diagnosis not present

## 2021-01-27 ENCOUNTER — Other Ambulatory Visit: Payer: Self-pay

## 2021-01-28 ENCOUNTER — Encounter: Payer: Self-pay | Admitting: Family Medicine

## 2021-01-28 ENCOUNTER — Ambulatory Visit: Payer: Medicare HMO | Admitting: Family Medicine

## 2021-01-28 VITALS — BP 117/58 | HR 62 | Temp 97.7°F | Resp 16 | Ht 74.0 in | Wt 212.4 lb

## 2021-01-28 DIAGNOSIS — E7800 Pure hypercholesterolemia, unspecified: Secondary | ICD-10-CM | POA: Diagnosis not present

## 2021-01-28 DIAGNOSIS — Z1159 Encounter for screening for other viral diseases: Secondary | ICD-10-CM

## 2021-01-28 DIAGNOSIS — Z0000 Encounter for general adult medical examination without abnormal findings: Secondary | ICD-10-CM

## 2021-01-28 DIAGNOSIS — G894 Chronic pain syndrome: Secondary | ICD-10-CM | POA: Diagnosis not present

## 2021-01-28 DIAGNOSIS — J3089 Other allergic rhinitis: Secondary | ICD-10-CM | POA: Diagnosis not present

## 2021-01-28 DIAGNOSIS — J301 Allergic rhinitis due to pollen: Secondary | ICD-10-CM | POA: Diagnosis not present

## 2021-01-28 DIAGNOSIS — Z23 Encounter for immunization: Secondary | ICD-10-CM | POA: Diagnosis not present

## 2021-01-28 DIAGNOSIS — I10 Essential (primary) hypertension: Secondary | ICD-10-CM | POA: Diagnosis not present

## 2021-01-28 MED ORDER — TETANUS-DIPHTH-ACELL PERTUSSIS 5-2-15.5 LF-MCG/0.5 IM SUSP: 0.5000 mL | Freq: Once | INTRAMUSCULAR | 0 refills | Status: AC

## 2021-01-28 MED ORDER — TRAMADOL HCL 50 MG PO TABS: ORAL_TABLET | ORAL | 5 refills | Status: DC

## 2021-01-28 NOTE — Progress Notes (Signed)
Office Note 01/28/2021  CC:  Chief Complaint  Patient presents with  . Annual Exam    fasting    HPI:  Brandon Haynes is a 80 y.o. White male who is here for annual health maintenance exam and 3 mo f/u chronic pain syndrome, HTN, and HLD. A/P as of last visit: "1) HTN: good control on current regimen of 1/2 of 25mg  lopressor bid, plus 1/2 of 50mg  losartan prn bp >130 and then 5mg  amlod prn bp>140 (Rarely has to take his losartan or amlod). Lytes/cr good 07/2020. Plan repeat 3 mo.  2) HLD, goal LDL <70: tolerating rosuva 20mg  qd and lipid levels excellent 02/2020. Plan rpt flp and hepatic panel 3 mo.  3) Chronic pain syndrome (bilat LBP with R sided sciatica), stable. Cont tramadol 50mg , 1 tid prn. Gabapentin and periodic ESIs via neurosurgeon."   INTERIM HX: Says feeling well.  HOME BP checks: consistently <130/80 and taking only his metoprolol (not amlodipine or losartan). No low bp's.  Tolerating statin daily (rosuva 20mg  qd). Eating veg's/fruits, chicken and pork, also admits to eating lots of complex carbs lately.  Wt is up 17 lbs over the last 6 mo and he attributes this to eating a lot of junk.  Exercise: arm exercises with 10 lb wts, also chest resistance exercises. Pretty active around his home and yard but no formal walking or CV exercise regimen.   Indication for chronic opioid:chronic low back pain (DDD) andR side sciatica pain. Gabapentin via [place] [facility].  ESI was done 11/2020-->helped his back pain some but not his sciatica pain.  Plan is for another ESI next month. Requires 2-3 tramadol pills per day to keep the pain at minimal level. Opioids r'xd to maximize functioning and quality of life. Pt has gotten periodic ESI but has no hx of back surgery Medication and dose:tramadol 50mg , 1tid prn # pills per month: 90 PMP AWARE reviewed today: most recent rx for tramadol 50mg  was filled 01/01/21, # 13, rx by me. No red flags.  Past Medical History:   Diagnosis Date  . Allergic rhinitis    allergy shots via [place]  . Anxiety   . 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  Lesion resected. No mets.  He got neoadjuvant chemo x 4 cycles.  Cystoprostectomy with ileal conduit formation 12/13/19 (Dr. Tresa Moore). Clear as of 11/2020 surveillance urethroscopy  . 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 C. difficile colitis 01/09/2020; late May 2021   oral vancomycin  . CAD, multiple vessel    a. CP/near-syncope 11/2014 s/p DES to MLAD, occluded RCA after takeoff of RV marginal with collaterals treated medically, otherwise mild nonobstructive disease. EF 55%;  b. 07/2015 Lexiscan CL: EF 56%, no ischemia/infarct.   . Cataracts, both eyes   . Chronic constipation    worse since ileal conduit surgery was done 2020  . Chronic low back pain   . DDD (degenerative disc disease), lumbar   . GERD (gastroesophageal reflux disease)    PPI bid needed to control sx's  . Gout   . Hearing loss   . History  of basal cell carcinoma   . History of adenomatous polyp of colon    Rpt colonoscopy recommended 02/2020->no further if no high risk lesions at that time.  Marland Kitchen History of prostate cancer 11/2019   grd 2 cancer with neg margins incidental on cystectomy path 11/2019.  Marland Kitchen Hypercholesteremia   . Hypertension    Hx labile/uncontrolled.  Improved s/p visit to HTN clinic 2019.  . Iron deficiency anemia 2017   Colonoscopy, EGD, and capsule endoscopy w/out obvious cause/source found.  Oral iron helping as of 03/2016.  . Leg pain 03/06/2016  . Nonmelanoma skin cancer   . 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 Peripheral vascular disease ([place])   . Pleural thickening    chronic  . 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 No avid enhancement or mass effect. Obs by urol as of 10/2019 and 11/2020.    Past Surgical History:  Procedure Laterality  Date  . back injection  oct 2011, 07/2010, 12/2012   no surgery  . Capsule endoscopy  02/2016   mild duodenitis, o/w NEG  . CARDIOVASCULAR STRESS TEST  05/2009; 07/2015  . carotid dopplers  11/2014   Bilateral - 1% to 9% ICA stenosis. Vertebral artery flow is  . CATARACT EXTRACTION, BILATERAL     Jan, Feb 2022  . COLONOSCOPY  08/2008; 02/2016; 02/2017; 05/27/20   2017: 12 polyps (adenomatous).  02/2017 repeat TCS--multiple polyps. 04/2020 +adenomas.  . CORONARY ANGIOPLASTY WITH STENT PLACEMENT  12/04/2014   4.0 x 12 mm Synergy stent to the mid LAD  . CYSTOSCOPY  05/24/2019  . CYSTOSCOPY W/ TRANSURETHRAL RESECTION OF POSTERIOR URETHERAL VALVES  06/12/2019   2-5cm  . CYSTOSCOPY WITH INJECTION N/A 12/13/2019   Procedure: CYSTOSCOPY WITH INJECTION;  Surgeon: [name], MD;  Location: [facility];  Service: Urology;  Laterality: N/A;  . ESOPHAGOGASTRODUODENOSCOPY  02/2016   Gastritis.  NEG h pylori.    . IR IMAGING GUIDED PORT INSERTION  09/12/2019  . IR REMOVAL TUN ACCESS W/ PORT W/O FL MOD SED  08/20/2020  . LE arterial vascular study  02/2016   Per Dr. Radford Pax, no evidence of peripheral vascular disease  . LEFT HEART CATHETERIZATION WITH CORONARY ANGIOGRAM N/A 12/04/2014   Procedure: LEFT HEART CATHETERIZATION WITH CORONARY ANGIOGRAM;  Surgeon: Jolaine Artist, MD; LAD 99% then aneurysmal, OM1 40%, CFX 50%, RCA 40%, 50% then occluded  . LYMPHADENECTOMY Bilateral 12/13/2019   Procedure: LYMPHADENECTOMY;  Surgeon: [name], MD;  Location: [facility];  Service: Urology;  Laterality: Bilateral;  . ROBOT ASSISTED LAPAROSCOPIC COMPLETE CYSTECT ILEAL CONDUIT N/A 12/13/2019   Procedure: XI ROBOTIC ASSISTED LAPAROSCOPIC COMPLETE CYSTECT ILEAL CONDUIT AND RADICAL PROSTATECTOMY;  Surgeon: [name], MD;  Location: [facility];  Service: Urology;  Laterality: N/A;  6 HRS  . ROTATOR CUFF REPAIR Left 09/2013   left  . SHOULDER INJECTION  feb 2014  . TRANSTHORACIC ECHOCARDIOGRAM  11/2014   EF 50-55%, some  wall motion abnormalities noted, grade I diast dysfxn  . TRANSURETHRAL RESECTION OF BLADDER TUMOR N/A 06/12/2019   INVASIVE HIGH GRADE PAPILLARY UROTHELIAL CARCINOMA, invading muscularis propria.  Procedure: TRANSURETHRAL RESECTION OF BLADDER TUMOR (TURBT);  Surgeon: Lucas Mallow, MD;  Location: [facility];  Service: Urology;  Laterality: N/A;    Family History  Problem Relation Age of Onset  . Lung cancer Father   . Heart disease Father   . Breast cancer Mother   . Congestive Heart Failure Mother   . Diabetes Mother   . Breast cancer Sister   . Heart disease Brother   . COPD Brother   . Bladder Cancer Maternal Grandfather   . Colon cancer Neg Hx   . Esophageal cancer Neg Hx   . Pancreatic cancer Neg Hx   . Rectal cancer Neg Hx   .  Stomach cancer Neg Hx   . Prostate cancer Neg Hx   . Colon polyps Neg Hx     Social History   Socioeconomic History  . Marital status: Married    Spouse name: Vaughan Basta  . Number of children: 3  . Years of education: Not on file  . Highest education level: Not on file  Occupational History  . Occupation: retired  Tobacco Use  . Smoking status: Former Smoker    Packs/day: 2.00    Years: 35.00    Pack years: 70.00    Types: Cigarettes    Quit date: 09/28/1998    Years since quitting: 22.3  . Smokeless tobacco: Former Systems developer    Types: Snuff    Quit date: 02/09/1986  . Tobacco comment: as a teenager  Vaping Use  . Vaping Use: Never used  Substance and Sexual Activity  . Alcohol use: No    Alcohol/week: 0.0 standard drinks    Comment: quit 1994  . Drug use: No  . Sexual activity: Not Currently  Other Topics Concern  . Not on file  Social History Narrative   Married, 3 children.   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   Orig from [place], lived [place] same farm all his life.   Former smoker: quit approx around [place].  80 pack-yr hx.   Alcohol-none, used to drink a lot of beer on weekends.  No drugs.   No formal exercise  but is active [place] his farm.   Diet: fair   Social Determinants of Health   Financial Resource Strain: Not on file  Food Insecurity: Not on file  Transportation Needs: Not on file  Physical Activity: Not on file  Stress: Not on file  Social Connections: Not on file  Intimate Partner Violence: Not on file    Outpatient Medications Prior to Visit  Medication Sig Dispense Refill  . allopurinol (ZYLOPRIM) 300 MG tablet TAKE 1 TABLET BY MOUTH EVERY DAY 90 tablet 3  . aspirin EC 81 MG tablet Take 81 mg by mouth daily.    Marland Kitchen desonide (DESOWEN) 0.05 % cream Apply 1 application topically 3 (three) times a week.   1  . diphenhydrAMINE (BENADRYL) 25 MG tablet Take 50 mg by mouth 3 (three) times daily as needed for itching or allergies.     . fluticasone (FLONASE) 50 MCG/ACT nasal spray Place 1 spray into both nostrils daily.    Marland Kitchen gabapentin (NEURONTIN) 300 MG capsule Take 300-600 mg by mouth See admin instructions. Take 2 capsules (600 mg) by mouth in the morning & take 1 capsule (300 mg) by mouth in the afternoon.    . loratadine (CLARITIN) 10 MG tablet Take 20 mg by mouth daily.    . metoprolol tartrate (LOPRESSOR) 25 MG tablet Take 0.5 tablets (12.5 mg total) by mouth 2 (two) times daily. 180 tablet 3  . pantoprazole (PROTONIX) 40 MG tablet TAKE 1 TABLET BY MOUTH EVERY DAY 90 tablet 1  . PREVIDENT 5000 BOOSTER PLUS 1.1 % PSTE Take 1 application by mouth at bedtime.   1  . rosuvastatin (CRESTOR) 20 MG tablet TAKE 1 TABLET BY MOUTH EVERY DAY 90 tablet 3  . traMADol (ULTRAM) 50 MG tablet TAKE 1 TABLET BY MOUTH 3 TIMES A DAY AS NEEDED FOR PAIN 90 tablet 5  . amLODipine (NORVASC) 5 MG tablet Take 5 mg by mouth daily.  (Patient not taking: Reported on 01/28/2021)    . EPINEPHrine 0.3 mg/0.3 mL IJ SOAJ injection  Inject 0.3 mg into the muscle as needed for anaphylaxis.  (Patient not taking: No sig reported)    . losartan (COZAAR) 50 MG tablet Take 1 tablet (50 mg total) by mouth daily. (Patient not taking:  No sig reported) 90 tablet 3  . nitroGLYCERIN (NITROSTAT) 0.4 MG SL tablet PLACE 1 TABLET UNDER TONGUE EVERY 5 MINS, UP TO 3 DOSES AS NEEDED FOR CHEST PAIN (Patient not taking: No sig reported) 25 tablet 1   No facility-administered medications prior to visit.    No Known Allergies  ROS Review of Systems  Constitutional: Negative for appetite change, chills, fatigue and fever.  HENT: Negative for congestion, dental problem, ear pain and sore throat.   Eyes: Negative for discharge, redness and visual disturbance.  Respiratory: Negative for cough, chest tightness, shortness of breath and wheezing.   Cardiovascular: Negative for chest pain, palpitations and leg swelling.  Gastrointestinal: Negative for abdominal pain, blood in stool, diarrhea, nausea and vomiting.  Genitourinary: Negative for difficulty urinating, dysuria, flank pain, frequency, hematuria and urgency.  Musculoskeletal: Negative for arthralgias, back pain, joint swelling, myalgias and neck stiffness.  Skin: Negative for pallor and rash.  Neurological: Negative for dizziness, speech difficulty, weakness and headaches.  Hematological: Negative for adenopathy. Does not bruise/bleed easily.  Psychiatric/Behavioral: Negative for confusion and sleep disturbance. The patient is not nervous/anxious.     PE; Vitals with BMI 01/28/2021 10/17/2020 08/20/2020  Height 6\' 2"  6\' 2"  -  Weight 212 lbs 6 oz 204 lbs 3 oz -  BMI 0000000 [id number] -  Systolic [id number]  Diastolic 58 75 78  Pulse 62 76 62    Gen: Alert, well appearing.  Patient is oriented to person, place, time, and situation. AFFECT: pleasant, lucid thought and speech. ENT: Ears: EACs clear, normal epithelium.  TMs with good light reflex and landmarks bilaterally.  Eyes: no injection, icteris, swelling, or exudate.  EOMI, PERRLA. Nose: no drainage or turbinate edema/swelling.  No injection or focal lesion.  Mouth: lips without lesion/swelling.  Oral mucosa pink and moist.   Dentition intact and without obvious caries or gingival swelling.  Oropharynx without erythema, exudate, or swelling.  Neck: supple/nontender.  No LAD, mass, or TM.  Carotid pulses 2+ bilaterally, without bruits. CV: RRR, no m/r/g.   LUNGS: CTA bilat, nonlabored resps, good aeration in all lung fields. ABD: soft, NT, ND, BS normal.  No hepatospenomegaly or mass.  No bruits. EXT: no clubbing, cyanosis, or edema.  Musculoskeletal: no joint swelling, erythema, warmth, or tenderness.  ROM of all joints intact. Skin - no sores or suspicious lesions or rashes or color changes   Pertinent labs:  Lab Results  Component Value Date   TSH 1.946 01/08/2020   Lab Results  Component Value Date   WBC 8.0 08/20/2020   HGB 13.7 08/20/2020   HCT 42.9 08/20/2020   MCV 91.1 08/20/2020   PLT 166 08/20/2020   Lab Results  Component Value Date   CREATININE 1.07 07/31/2020   BUN 15 07/31/2020   NA 142 07/31/2020   K 4.0 07/31/2020   CL 108 07/31/2020   CO2 29 07/31/2020   Lab Results  Component Value Date   ALT 14 07/31/2020   AST 17 07/31/2020   ALKPHOS 50 07/31/2020   BILITOT 0.5 07/31/2020   Lab Results  Component Value Date   CHOL 116 03/20/2020   Lab Results  Component Value Date   HDL 62 03/20/2020   Lab Results  Component Value Date  LDLCALC 37 03/20/2020   Lab Results  Component Value Date   TRIG 85 03/20/2020   Lab Results  Component Value Date   CHOLHDL 1.9 03/20/2020   Lab Results  Component Value Date   PSA <0.015 11/12/2020   PSA <0.015 06/04/2020   PSA 2.13 03/22/2014   Lab Results  Component Value Date   HGBA1C 5.2 04/05/2019   Lab Results  Component Value Date   LABURIC 4.3 02/21/2013    ASSESSMENT AND PLAN:   1) HTN: stable on just 12.5mg  lopressor bid. He has amlodipine and losartan at home to take if signif elev bp begins to be an issue again in future. Lytes/cr today.  2) HLD: tolerating crestor 20mg  qd. FLP and hepatic panel today. LDL  goal <70.  It was 37 about 11 mo ago.  3) Chronic pain syndrome: stable. CSC UTD. Sent in rx today for tramadol 50mg , 1 tid prn, #90, RF x 5.  4) Health maintenance exam: Reviewed age and gender appropriate health maintenance issues (prudent diet, regular exercise, health risks of tobacco and excessive alcohol, use of seatbelts, fire alarms in home, use of sunscreen).  Also reviewed age and gender appropriate health screening as well as vaccine recommendations. Vaccines: Tdap booster due->rx sent to pharmacy.  Otherwise ALL UTD. Labs: CMET, FLP, CBC, hep c screening. Prostate ca screening: n/a d/t hx of prostatectomy. Colon ca screening: hx of polyps, most recently 05/27/20.  Recall 3 yrs.  An After Visit Summary was printed and given to the patient.  FOLLOW UP:  Return in about 3 months (around 04/30/2021) for routine chronic illness f/u.  Signed:  Crissie Sickles, MD           01/28/2021

## 2021-01-29 ENCOUNTER — Inpatient Hospital Stay: Payer: Medicare HMO | Admitting: Oncology

## 2021-01-29 ENCOUNTER — Inpatient Hospital Stay: Payer: Medicare HMO | Attending: Oncology

## 2021-01-29 ENCOUNTER — Encounter: Payer: Self-pay | Admitting: Family Medicine

## 2021-01-29 ENCOUNTER — Other Ambulatory Visit: Payer: Self-pay

## 2021-01-29 VITALS — BP 130/75 | HR 60 | Temp 97.8°F | Resp 17 | Ht 74.0 in | Wt 212.6 lb

## 2021-01-29 DIAGNOSIS — N2889 Other specified disorders of kidney and ureter: Secondary | ICD-10-CM | POA: Diagnosis not present

## 2021-01-29 DIAGNOSIS — Z905 Acquired absence of kidney: Secondary | ICD-10-CM | POA: Diagnosis not present

## 2021-01-29 DIAGNOSIS — Z9221 Personal history of antineoplastic chemotherapy: Secondary | ICD-10-CM | POA: Insufficient documentation

## 2021-01-29 DIAGNOSIS — C679 Malignant neoplasm of bladder, unspecified: Secondary | ICD-10-CM | POA: Diagnosis not present

## 2021-01-29 DIAGNOSIS — Z8551 Personal history of malignant neoplasm of bladder: Secondary | ICD-10-CM | POA: Diagnosis not present

## 2021-01-29 LAB — LIPID PANEL
Cholesterol: 130 mg/dL (ref <200)
HDL: 60 mg/dL (ref >=40)
LDL Cholesterol (Calc): 48 mg/dL (calc)
Non-HDL Cholesterol (Calc): 70 mg/dL (calc) (ref <130)
Total CHOL/HDL Ratio: 2.2 (calc) (ref <5.0)
Triglycerides: 135 mg/dL (ref <150)

## 2021-01-29 LAB — HEPATITIS C ANTIBODY
Hepatitis C Ab: NONREACTIVE
SIGNAL TO CUT-OFF: 0 (ref <1.00)

## 2021-01-29 LAB — CBC WITH DIFFERENTIAL/PLATELET
Absolute Monocytes: 510 cells/uL (ref 200–950)
Basophils Absolute: 41 cells/uL (ref 0–200)
Basophils Relative: 0.6 %
Eosinophils Absolute: 170 cells/uL (ref 15–500)
Eosinophils Relative: 2.5 %
HCT: 38.1 % — ABNORMAL LOW (ref 38.5–50.0)
Hemoglobin: 12.6 g/dL — ABNORMAL LOW (ref 13.2–17.1)
Lymphs Abs: 2203 cells/uL (ref 850–3900)
MCH: 29.9 pg (ref 27.0–33.0)
MCHC: 33.1 g/dL (ref 32.0–36.0)
MCV: 90.5 fL (ref 80.0–100.0)
MPV: 10.9 fL (ref 7.5–12.5)
Monocytes Relative: 7.5 %
Neutro Abs: 3876 cells/uL (ref 1500–7800)
Neutrophils Relative %: 57 %
Platelets: 153 10*3/uL (ref 140–400)
RBC: 4.21 10*6/uL (ref 4.20–5.80)
RDW: 14.7 % (ref 11.0–15.0)
Total Lymphocyte: 32.4 %
WBC: 6.8 10*3/uL (ref 3.8–10.8)

## 2021-01-29 LAB — COMPREHENSIVE METABOLIC PANEL
AG Ratio: 2.1 (calc) (ref 1.0–2.5)
ALT: 13 U/L (ref 9–46)
AST: 16 U/L (ref 10–35)
Albumin: 4.1 g/dL (ref 3.6–5.1)
Alkaline phosphatase (APISO): 44 U/L (ref 35–144)
BUN/Creatinine Ratio: 12 (calc) (ref 6–22)
BUN: 14 mg/dL (ref 7–25)
CO2: 26 mmol/L (ref 20–32)
Calcium: 9 mg/dL (ref 8.6–10.3)
Chloride: 108 mmol/L (ref 98–110)
Creat: 1.2 mg/dL — ABNORMAL HIGH (ref 0.70–1.18)
Globulin: 2 g/dL (calc) (ref 1.9–3.7)
Glucose, Bld: 73 mg/dL (ref 65–99)
Potassium: 3.9 mmol/L (ref 3.5–5.3)
Sodium: 145 mmol/L (ref 135–146)
Total Bilirubin: 0.6 mg/dL (ref 0.2–1.2)
Total Protein: 6.1 g/dL (ref 6.1–8.1)

## 2021-01-29 NOTE — Progress Notes (Signed)
Hematology and Oncology Follow Up Visit  Brandon Haynes [id number] 12-21-1940 80 y.o. 01/29/2021 12:42 PM McGowen, Brandon Haynes, MDMcGowen, Brandon Blackwater, MD   Principle Diagnosis: 80 year old man with bladder cancer diagnosed in September 2020.  He was found to have T2N0 high-grade urothelial carcinoma of the bladder without any residual disease after surgical resection.   Prior Therapy:  He is status post TURBT on 06/12/2019 which showed high grade invasive urothelial carcinoma invading into the muscularis propria.  Neoadjuvant chemotherapy utilizing cisplatin and gemcitabine.  He completed 4 cycles of chemotherapy on October 20, 2019.  He is status post radical cystectomy and lymphadenectomy completed on December 13, 2019.  Final pathology showed The carcinoma in situ with 0 out of 12 lymph node involvement.    Current therapy: Active surveillance.    Interim History: Brandon Haynes presents today for a follow-up visit.  Since the last visit, he reports no major changes in his health.  He denies any residual complications related to chemotherapy.  Denies any nausea, vomiting or abdominal pain.  He denies any hospitalization or illnesses.  He denies any hematuria or dysuria.     Medications: Unchanged on review. Current Outpatient Medications  Medication Sig Dispense Refill  . allopurinol (ZYLOPRIM) 300 MG tablet TAKE 1 TABLET BY MOUTH EVERY DAY 90 tablet 3  . amLODipine (NORVASC) 5 MG tablet Take 5 mg by mouth daily.  (Patient not taking: Reported on 01/28/2021)    . aspirin EC 81 MG tablet Take 81 mg by mouth daily.    Marland Kitchen desonide (DESOWEN) 0.05 % cream Apply 1 application topically 3 (three) times a week.   1  . diphenhydrAMINE (BENADRYL) 25 MG tablet Take 50 mg by mouth 3 (three) times daily as needed for itching or allergies.     Marland Kitchen EPINEPHrine 0.3 mg/0.3 mL IJ SOAJ injection Inject 0.3 mg into the muscle as needed for anaphylaxis.  (Patient not taking: No sig reported)    . fluticasone (FLONASE)  50 MCG/ACT nasal spray Place 1 spray into both nostrils daily.    Marland Kitchen gabapentin (NEURONTIN) 300 MG capsule Take 300-600 mg by mouth See admin instructions. Take 2 capsules (600 mg) by mouth in the morning & take 1 capsule (300 mg) by mouth in the afternoon.    . loratadine (CLARITIN) 10 MG tablet Take 20 mg by mouth daily.    Marland Kitchen losartan (COZAAR) 50 MG tablet Take 1 tablet (50 mg total) by mouth daily. (Patient not taking: No sig reported) 90 tablet 3  . metoprolol tartrate (LOPRESSOR) 25 MG tablet Take 0.5 tablets (12.5 mg total) by mouth 2 (two) times daily. 180 tablet 3  . nitroGLYCERIN (NITROSTAT) 0.4 MG SL tablet PLACE 1 TABLET UNDER TONGUE EVERY 5 MINS, UP TO 3 DOSES AS NEEDED FOR CHEST PAIN (Patient not taking: No sig reported) 25 tablet 1  . pantoprazole (PROTONIX) 40 MG tablet TAKE 1 TABLET BY MOUTH EVERY DAY 90 tablet 1  . PREVIDENT 5000 BOOSTER PLUS 1.1 % PSTE Take 1 application by mouth at bedtime.   1  . rosuvastatin (CRESTOR) 20 MG tablet TAKE 1 TABLET BY MOUTH EVERY DAY 90 tablet 3  . traMADol (ULTRAM) 50 MG tablet TAKE 1 TABLET BY MOUTH 3 TIMES A DAY AS NEEDED FOR PAIN 90 tablet 5   No current facility-administered medications for this visit.     Allergies: No Known Allergies      Physical Exam:   Blood pressure 130/75, pulse 60, temperature 97.8 F (36.6 C),  temperature source Tympanic, resp. rate 17, height 6\' 2"  (1.88 m), weight 212 lb 9.6 oz (96.4 kg), SpO2 100 %.    ECOG: 0    General appearance: Comfortable appearing without any discomfort Head: Normocephalic without any trauma Oropharynx: Mucous membranes are moist and pink without any thrush or ulcers. Eyes: Pupils are equal and round reactive to light. Lymph nodes: No cervical, supraclavicular, inguinal or axillary lymphadenopathy.   Heart:regular rate and rhythm.  S1 and S2 without leg edema. Lung: Clear without any rhonchi or wheezes.  No dullness to percussion. Abdomin: Soft, nontender, nondistended  with good bowel sounds.  No hepatosplenomegaly. Musculoskeletal: No joint deformity or effusion.  Full range of motion noted. Neurological: No deficits noted on motor, sensory and deep tendon reflex exam. Skin: No petechial rash or dryness.  Appeared moist.          Lab Results: Lab Results  Component Value Date   WBC 6.8 01/28/2021   HGB 12.6 (L) 01/28/2021   HCT 38.1 (L) 01/28/2021   MCV 90.5 01/28/2021   PLT 153 01/28/2021     Chemistry      Component Value Date/Time   NA 145 01/28/2021 1147   NA 143 03/13/2019 1141   K 3.9 01/28/2021 1147   CL 108 01/28/2021 1147   CO2 26 01/28/2021 1147   BUN 14 01/28/2021 1147   BUN 11 03/13/2019 1141   CREATININE 1.20 (H) 01/28/2021 1147      Component Value Date/Time   CALCIUM 9.0 01/28/2021 1147   ALKPHOS 50 07/31/2020 1005   AST 16 01/28/2021 1147   AST 17 07/31/2020 1005   ALT 13 01/28/2021 1147   ALT 14 07/31/2020 1005   BILITOT 0.6 01/28/2021 1147   BILITOT 0.5 07/31/2020 1005     IMPRESSION: 1. 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 2. No evidence of metastatic disease. 3. Stable postsurgical changes status post cysto prostatectomy and urinary diversion. 4. Coronary and aortic Atherosclerosis (ICD10-I70.0). Impression and Plan:  80 year old man with:  1.  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   His disease status was updated at this time and treatment options were reviewed.  He has no evidence of disease relapse based on imaging studies obtained in February 2022.  Laboratory data on Jan 28, 2021 which showed also normal hematological parameters and kidney function.  At this time I see no evidence of relapsed disease or any additional treatment.  Different salvage therapy options in the future including systemic  chemotherapy, immunotherapy, antibody drug conjugate as well as oral targeted therapy were reiterated.  He is agreeable to continue with active surveillance.  2.  IV access: Port-A-Cath removed without any complications.  3.  Anemia: Resolved at this time.  Related to malignancy and chemotherapy.  4.  Left kidney mass: Unchanged on imaging studies and continues to be followed by Dr. Tresa Moore.  He will have repeat imaging studies in 6 months.  6.   Follow-up: In 6 months for repeat evaluation.  30 minutes were spent on this visit.  The time was dedicated to reviewing imaging studies, disease status update and future plan of care discussion.    Zola Button, MD 5/4/202212:42 PM

## 2021-02-04 DIAGNOSIS — K219 Gastro-esophageal reflux disease without esophagitis: Secondary | ICD-10-CM | POA: Diagnosis not present

## 2021-02-04 DIAGNOSIS — J301 Allergic rhinitis due to pollen: Secondary | ICD-10-CM | POA: Diagnosis not present

## 2021-02-04 DIAGNOSIS — J3089 Other allergic rhinitis: Secondary | ICD-10-CM | POA: Diagnosis not present

## 2021-02-04 DIAGNOSIS — J300 Vasomotor rhinitis: Secondary | ICD-10-CM | POA: Diagnosis not present

## 2021-02-04 NOTE — Progress Notes (Signed)
Subjective:   Brandon Haynes is a 80 y.o. male who presents for Medicare Annual/Subsequent preventive examination.   Review of Systems     Cardiac Risk Factors include: advanced age (>43men, >10 women);male gender;hypertension;dyslipidemia     Objective:    Today's Vitals   02/05/21 1358  BP: 130/82  Resp: 16  Temp: 97.8 F (36.6 C)  TempSrc: Temporal  Weight: 213 lb (96.6 kg)  Height: 6\' 2"  (1.88 m)   Body mass index is 27.35 kg/m.  Advanced Directives 02/05/2021 08/20/2020 02/20/2020 02/19/2020 01/08/2020 12/13/2019 12/12/2019  Does Patient Have a Medical Advance Directive? Yes Yes No No Yes Yes Yes  Type of Paramedic of [place];Living will [place];Living will - - Healthcare Power of [place]  Does patient want to make changes to medical advance directive? - No - Patient declined - - Yes (ED - Information included in AVS) No - Patient declined Yes (Inpatient - patient defers changing a medical advance directive at this time - Information given)  Copy of [place] in Chart? No - copy requested No - copy requested - - Yes - validated most recent copy scanned in chart (See row information) Yes - validated most recent copy scanned in chart (See row information) -  Would patient like information on creating a medical advance directive? - - No - Patient declined - - - -    Current Medications (verified) Outpatient Encounter Medications as of 02/05/2021  Medication Sig  . allopurinol (ZYLOPRIM) 300 MG tablet TAKE 1 TABLET BY MOUTH EVERY DAY  . aspirin EC 81 MG tablet Take 81 mg by mouth daily.  Marland Kitchen desonide (DESOWEN) 0.05 % cream Apply 1 application topically 3 (three) times a week.   . diphenhydrAMINE (BENADRYL) 25 MG tablet Take 50 mg by mouth 3 (three) times daily as needed for itching or allergies.   . fluticasone (FLONASE) 50 MCG/ACT nasal spray Place 1 spray  into both nostrils daily.  Marland Kitchen gabapentin (NEURONTIN) 300 MG capsule Take 300-600 mg by mouth See admin instructions. Take 2 capsules (600 mg) by mouth in the morning & take 1 capsule (300 mg) by mouth in the afternoon.  . loratadine (CLARITIN) 10 MG tablet Take 20 mg by mouth daily.  . metoprolol tartrate (LOPRESSOR) 25 MG tablet Take 0.5 tablets (12.5 mg total) by mouth 2 (two) times daily.  . pantoprazole (PROTONIX) 40 MG tablet TAKE 1 TABLET BY MOUTH EVERY DAY  . PREVIDENT 5000 BOOSTER PLUS 1.1 % PSTE Take 1 application by mouth at bedtime.   . rosuvastatin (CRESTOR) 20 MG tablet TAKE 1 TABLET BY MOUTH EVERY DAY  . traMADol (ULTRAM) 50 MG tablet TAKE 1 TABLET BY MOUTH 3 TIMES A DAY AS NEEDED FOR PAIN  . amLODipine (NORVASC) 5 MG tablet Take 5 mg by mouth daily.  (Patient not taking: No sig reported)  . EPINEPHrine 0.3 mg/0.3 mL IJ SOAJ injection Inject 0.3 mg into the muscle as needed for anaphylaxis.  (Patient not taking: No sig reported)  . losartan (COZAAR) 50 MG tablet Take 1 tablet (50 mg total) by mouth daily. (Patient not taking: No sig reported)  . nitroGLYCERIN (NITROSTAT) 0.4 MG SL tablet PLACE 1 TABLET UNDER TONGUE EVERY 5 MINS, UP TO 3 DOSES AS NEEDED FOR CHEST PAIN (Patient not taking: No sig reported)   No facility-administered encounter medications on file as of 02/05/2021.    Allergies (verified) Patient has  no known allergies.   History: Past Medical History:  Diagnosis Date  . Allergic rhinitis    allergy shots via [place]  . Anxiety   . 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  Lesion resected. No mets.  He got neoadjuvant chemo x 4 cycles.  Cystoprostectomy with ileal conduit formation 12/13/19 (Dr. Tresa Moore). Clear as of 11/2020 surveillance urethroscopy  . 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 C. difficile colitis 01/09/2020; late May 2021   oral vancomycin  . CAD, multiple vessel    a.  CP/near-syncope 11/2014 s/p DES to MLAD, occluded RCA after takeoff of RV marginal with collaterals treated medically, otherwise mild nonobstructive disease. EF 55%;  b. 07/2015 Lexiscan CL: EF 56%, no ischemia/infarct.   . Cataracts, both eyes   . Chronic constipation    worse since ileal conduit surgery was done 2020  . Chronic low back pain   . DDD (degenerative disc disease), lumbar   . GERD (gastroesophageal reflux disease)    PPI bid needed to control sx's  . Gout   . Hearing loss   . History  of basal cell carcinoma   . History of adenomatous polyp of colon    Rpt colonoscopy recommended 02/2020->no further if no high risk lesions at that time.  Marland Kitchen History of prostate cancer 11/2019   grd 2 cancer with neg margins incidental on cystectomy path 11/2019.  Marland Kitchen Hypercholesteremia   . Hypertension    Hx labile/uncontrolled.  Improved s/p visit to HTN clinic 2019.  . Iron deficiency anemia 2017   Colonoscopy, EGD, and capsule endoscopy w/out obvious cause/source found.  Oral iron helping as of 03/2016.  . Leg pain 03/06/2016  . Nonmelanoma skin cancer   . 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 Peripheral vascular disease ([place])   . Pleural thickening    chronic  . 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 No avid enhancement or mass effect. Obs by urol as of 10/2019 and 11/2020.   Past Surgical History:  Procedure Laterality Date  . back injection  oct 2011, 07/2010, 12/2012   no surgery  . Capsule endoscopy  02/2016   mild duodenitis, o/w NEG  . CARDIOVASCULAR STRESS TEST  05/2009; 07/2015  . carotid dopplers  11/2014   Bilateral - 1% to 9% ICA stenosis. Vertebral artery flow is  . CATARACT EXTRACTION, BILATERAL     Jan, Feb 2022  . COLONOSCOPY  08/2008; 02/2016; 02/2017; 05/27/20   2017: 12 polyps (adenomatous).  02/2017 repeat TCS--multiple polyps. 04/2020 +adenomas.  . CORONARY ANGIOPLASTY WITH STENT PLACEMENT  12/04/2014   4.0  x 12 mm Synergy stent to the mid LAD  . CYSTOSCOPY  05/24/2019  . CYSTOSCOPY W/ TRANSURETHRAL RESECTION OF POSTERIOR URETHERAL VALVES  06/12/2019   2-5cm  . CYSTOSCOPY WITH INJECTION N/A 12/13/2019   Procedure: CYSTOSCOPY WITH INJECTION;  Surgeon: [name], MD;  Location: [facility];  Service: Urology;  Laterality: N/A;  . ESOPHAGOGASTRODUODENOSCOPY  02/2016   Gastritis.  NEG h pylori.    . IR IMAGING GUIDED PORT INSERTION  09/12/2019  . IR REMOVAL TUN ACCESS W/ PORT W/O FL MOD SED  08/20/2020  . LE arterial vascular study  02/2016   Per Dr. Radford Pax, no evidence of peripheral vascular disease  . LEFT HEART CATHETERIZATION WITH CORONARY ANGIOGRAM N/A 12/04/2014   Procedure: LEFT HEART CATHETERIZATION WITH CORONARY ANGIOGRAM;  Surgeon: Shaune Pascal  Bensimhon, MD; LAD 99% then aneurysmal, OM1 40%, CFX 50%, RCA 40%, 50% then occluded  . LYMPHADENECTOMY Bilateral 12/13/2019   Procedure: LYMPHADENECTOMY;  Surgeon: [name], MD;  Location: [facility];  Service: Urology;  Laterality: Bilateral;  . ROBOT ASSISTED LAPAROSCOPIC COMPLETE CYSTECT ILEAL CONDUIT N/A 12/13/2019   Procedure: XI ROBOTIC ASSISTED LAPAROSCOPIC COMPLETE CYSTECT ILEAL CONDUIT AND RADICAL PROSTATECTOMY;  Surgeon: [name], MD;  Location: [facility];  Service: Urology;  Laterality: N/A;  6 HRS  . ROTATOR CUFF REPAIR Left 09/2013   left  . SHOULDER INJECTION  feb 2014  . TRANSTHORACIC ECHOCARDIOGRAM  11/2014   EF 50-55%, some wall motion abnormalities noted, grade I diast dysfxn  . TRANSURETHRAL RESECTION OF BLADDER TUMOR N/A 06/12/2019   INVASIVE HIGH GRADE PAPILLARY UROTHELIAL CARCINOMA, invading muscularis propria.  Procedure: TRANSURETHRAL RESECTION OF BLADDER TUMOR (TURBT);  Surgeon: Lucas Mallow, MD;  Location: [facility];  Service: Urology;  Laterality: N/A;   Family History  Problem Relation Age of Onset  . Lung cancer Father   . Heart disease Father   . Breast cancer Mother   . Congestive Heart Failure Mother   .  Diabetes Mother   . Breast cancer Sister   . Heart disease Brother   . COPD Brother   . Bladder Cancer Maternal Grandfather   . Colon cancer Neg Hx   . Esophageal cancer Neg Hx   . Pancreatic cancer Neg Hx   . Rectal cancer Neg Hx   . Stomach cancer Neg Hx   . Prostate cancer Neg Hx   . Colon polyps Neg Hx    Social History   Socioeconomic History  . Marital status: Married    Spouse name: Vaughan Basta  . Number of children: 3  . Years of education: Not on file  . Highest education level: Not on file  Occupational History  . Occupation: retired  Tobacco Use  . Smoking status: Former Smoker    Packs/day: 2.00    Years: 35.00    Pack years: 70.00    Types: Cigarettes    Quit date: 09/28/1998    Years since quitting: 22.3  . Smokeless tobacco: Former Systems developer    Types: Snuff    Quit date: 02/09/1986  . Tobacco comment: as a teenager  Vaping Use  . Vaping Use: Never used  Substance and Sexual Activity  . Alcohol use: No    Alcohol/week: 0.0 standard drinks    Comment: quit 1994  . Drug use: No  . Sexual activity: Not Currently  Other Topics Concern  . Not on file  Social History Narrative   Married, 3 children.   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   Orig from [place], lived [place] same farm all his life.   Former smoker: quit approx around [place].  80 pack-yr hx.   Alcohol-none, used to drink a lot of beer on weekends.  No drugs.   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   Diet: fair   Social Determinants of Health   Financial Resource Strain: Low Risk   . Difficulty of Paying Living Expenses: Not hard at all  Food Insecurity: No Food Insecurity  . Worried About Charity fundraiser in the Last Year: Never true  . Ran Out of Food in the Last Year: Never true  Transportation Needs: No Transportation Needs  . Lack of Transportation (Medical): No  . Lack of Transportation (Non-Medical): No  Physical Activity: Insufficiently Active  .  Days of  Exercise per Week: 3 days  . Minutes of Exercise per Session: 30 min  Stress: No Stress Concern Present  . Feeling of Stress : Not at all  Social Connections: Moderately Integrated  . Frequency of Communication with Friends and Family: More than three times a week  . Frequency of Social Gatherings with Friends and Family: More than three times a week  . Attends Religious Services: More than 4 times per year  . Active Member of Clubs or Organizations: No  . Attends Archivist Meetings: Never  . Marital Status: Married    Tobacco Counseling Counseling given: Not Answered Comment: as a teenager   Clinical Intake:  Pre-visit preparation completed: Yes  Pain : No/denies pain     Nutritional Status: BMI 25 -29 Overweight Nutritional Risks: None Diabetes: No  How often do you need to have someone help you when you read instructions, pamphlets, or other written materials from your doctor or pharmacy?: 1 - Never  Diabetic?No  Interpreter Needed?: No  Information entered by :: Caroleen Hamman LPN   Activities of Daily Living In your present state of health, do you have any difficulty performing the following activities: 02/05/2021 08/20/2020  Hearing? N N  Vision? N N  Difficulty concentrating or making decisions? N N  Walking or climbing stairs? N N  Dressing or bathing? N N  Doing errands, shopping? N -  Preparing Food and eating ? N -  Using the Toilet? N -  In the past six months, have you accidently leaked urine? N -  Do you have problems with loss of bowel control? N -  Managing your Medications? N -  Managing your Finances? N -  Housekeeping or managing your Housekeeping? N -  Some recent data might be hidden    Patient Care Team: Tammi Sou, MD as PCP - General (Family Medicine) Jovita Gamma, MD as Consulting Physician (Neurosurgery) Mcarthur Rossetti, MD as Consulting Physician (Orthopedic Surgery) Lavonna Monarch, MD as Consulting  Physician (Dermatology) Sueanne Margarita, MD as Consulting Physician (Cardiology) Gardiner Barefoot, DPM as Consulting Physician (Podiatry) Armbruster, Carlota Raspberry, MD as Consulting Physician (Gastroenterology) Melida Quitter, MD as Consulting Physician (Otolaryngology) Harold Hedge, Darrick Grinder, MD as Consulting Physician (Allergy and Immunology) Lucas Mallow, MD as Consulting Physician (Urology) Wyatt Portela, MD as Consulting Physician (Oncology) [name], MD as Consulting Physician (Urology) [name], MD as Consulting Physician (Urology)  Indicate any recent Medical Services you may have received from other than [facility] providers in the past year (date may be approximate).     Assessment:   This is a routine wellness examination for Frantz.  Hearing/Vision screen  Hearing Screening   125Hz  250Hz  500Hz  1000Hz  2000Hz  3000Hz  4000Hz  6000Hz  8000Hz   Right ear:           Left ear:           Comments: Wears hearing last  Vision Screening Comments: Last eye exam-12/2020-Dr. Groat Wears reading glasses  Dietary issues and exercise activities discussed: Current Exercise Habits: Home exercise routine (pt states he does a lot of yard work), Type of exercise: strength training/weights;stretching, Time (Minutes): 30, Frequency (Times/Week): 3, Weekly Exercise (Minutes/Week): 90, Intensity: Mild, Exercise limited by: None identified  Goals Addressed            This Visit's Progress   . Patient Stated       Cut back on sweets & try to lose 10-15 pounds  Depression Screen PHQ 2/9 Scores 02/05/2021 07/05/2020 01/03/2020 10/14/2018 10/22/2017 10/20/2016 10/18/2015  PHQ - 2 Score 0 0 0 0 0 0 0    Fall Risk Fall Risk  02/05/2021 07/05/2020 10/14/2018 08/05/2018 10/22/2017  Falls in the past year? 0 0 0 1 No  Number falls in past yr: 0 0 0 0 -  Injury with Fall? 0 0 0 0 -  Risk for fall due to : - - - - -  Follow up Falls prevention discussed - - Education provided;Falls evaluation  completed -    FALL RISK PREVENTION PERTAINING TO THE HOME:  Any stairs in or around the home? Yes  If so, are there any without handrails? Yes  Home free of loose throw rugs in walkways, pet beds, electrical cords, etc? Yes  Adequate lighting in your home to reduce risk of falls? Yes   ASSISTIVE DEVICES UTILIZED TO PREVENT FALLS:  Life alert? No  Use of a cane, walker or w/c? No  Grab bars in the bathroom? No  Shower chair or bench in shower? No  Elevated toilet seat or a handicapped toilet? No   TIMED UP AND GO:  Was the test performed? Yes .  Length of time to ambulate 10 feet: 9 sec.   Gait steady and fast without use of assistive device  Cognitive Function:Normal cognitive status assessed by direct observation by this Nurse Health Advisor. No abnormalities found.   MMSE - Mini Mental State Exam 10/22/2017  Orientation to time 5  Orientation to Place 5  Registration 3  Attention/ Calculation 3  Recall 3  Language- name 2 objects 2  Language- repeat 1  Language- follow 3 step command 3  Language- read & follow direction 1  Write a sentence 1  Copy design 1  Total score 28     6CIT Screen 02/05/2021  What Year? 0 points  What month? 0 points  What time? 0 points  Count back from 20 0 points  Months in reverse 0 points  Repeat phrase 0 points  Total Score 0    Immunizations Immunization History  Administered Date(s) Administered  . Influenza Split 07/08/2011, 05/23/2012, 05/29/2013  . Influenza Whole 07/19/2009  . Influenza, High Dose Seasonal PF 05/24/2018, 12/19/2018, 05/18/2019, 02/05/2020  . Influenza-Unspecified 07/04/2014, 06/18/2015, 06/16/2016, 06/04/2017, 05/07/2020  . Moderna Sars-Covid-2 Vaccination 10/24/2019, 11/24/2019, 08/05/2020  . Pneumococcal Conjugate-13 03/22/2014  . Pneumococcal Polysaccharide-23 07/19/2009, 12/19/2018, 02/05/2020  . Td 02/20/2010  . Zoster 09/28/2009  . Zoster Recombinat (Shingrix) 10/21/2017, 02/04/2018    TDAP  status: Due, Education has been provided regarding the importance of this vaccine. Advised may receive this vaccine at local pharmacy or Health Dept. Aware to provide a copy of the vaccination record if obtained from local pharmacy or Health Dept. Verbalized acceptance and understanding.  Flu Vaccine status: Up to date  Pneumococcal vaccine status: Up to date  Covid-19 vaccine status: Completed vaccines  Qualifies for Shingles Vaccine? No   Zostavax completed Yes   Shingrix Completed?: Yes  Screening Tests Health Maintenance  Topic Date Due  . TETANUS/TDAP  02/21/2020  . COVID-19 Vaccine (4 - Booster for Moderna series) 02/02/2021  . INFLUENZA VACCINE  04/28/2021  . COLONOSCOPY (Pts 45-45yrs Insurance coverage will need to be confirmed)  05/28/2023  . Hepatitis C Screening  Completed  . PNA vac Low Risk Adult  Completed  . HPV VACCINES  Aged Out    Health Maintenance  Health Maintenance Due  Topic Date Due  . TETANUS/TDAP  02/21/2020  . COVID-19 Vaccine (4 - Booster for Moderna series) 02/02/2021    Colorectal cancer screening: No longer required.   Lung Cancer Screening: (Low Dose CT Chest recommended if Age 18-80 years, 30 pack-year currently smoking OR have quit w/in 15years.) does not qualify.     Additional Screening:  Hepatitis C Screening: does not qualify  Vision Screening: Recommended annual ophthalmology exams for early detection of glaucoma and other disorders of the eye. Is the patient up to date with their annual eye exam?  Yes  Who is the provider or what is the name of the office in which the patient attends annual eye exams? Dr. Katy Fitch  Dental Screening: Recommended annual dental exams for proper oral hygiene  Community Resource Referral / Chronic Care Management: CRR required this visit?  No   CCM required this visit?  No      Plan:     I have personally reviewed and noted the following in the patient's chart:   . Medical and social  history . Use of alcohol, tobacco or illicit drugs  . Current medications and supplements including opioid prescriptions. Patient is not currently taking opioid prescriptions. . Functional ability and status . Nutritional status . Physical activity . Advanced directives . List of other physicians . Hospitalizations, surgeries, and ER visits in previous 12 months . Vitals . Screenings to include cognitive, depression, and falls . Referrals and appointments  In addition, I have reviewed and discussed with patient certain preventive protocols, quality metrics, and best practice recommendations. A written personalized care plan for preventive services as well as general preventive health recommendations were provided to patient.   Patient to access avs on mychart.  Marta Antu, LPN   [id number]  Nurse Health Advisor  Nurse Notes: None

## 2021-02-05 ENCOUNTER — Other Ambulatory Visit: Payer: Self-pay

## 2021-02-05 ENCOUNTER — Ambulatory Visit (INDEPENDENT_AMBULATORY_CARE_PROVIDER_SITE_OTHER): Payer: Medicare HMO

## 2021-02-05 VITALS — BP 130/82 | Temp 97.8°F | Resp 16 | Ht 74.0 in | Wt 213.0 lb

## 2021-02-05 DIAGNOSIS — Z0000 Encounter for general adult medical examination without abnormal findings: Secondary | ICD-10-CM | POA: Diagnosis not present

## 2021-02-05 NOTE — Patient Instructions (Signed)
Brandon Haynes , Thank you for taking time to come for your Medicare Wellness Visit. I appreciate your ongoing commitment to your health goals. Please review the following plan we discussed and let me know if I can assist you in the future.   Screening recommendations/referrals: Colonoscopy: No longer required Recommended yearly ophthalmology/optometry visit for glaucoma screening and checkup Recommended yearly dental visit for hygiene and checkup  Vaccinations: Influenza vaccine: Up to date Pneumococcal vaccine: Completed vacines Tdap vaccine: Discuss with pharmacy Shingles vaccine: Completed vaccines   Covid-19: Up to date  Advanced directives: Please bring a copy for your chart  Conditions/risks identified: See problem list  Next appointment: Follow up in one year for your annual wellness visit. 02/11/2022 @ 9:45  Preventive Care 65 Years and Older, Male Preventive care refers to lifestyle choices and visits with your health care provider that can promote health and wellness. What does preventive care include?  A yearly physical exam. This is also called an annual well check.  Dental exams once or twice a year.  Routine eye exams. Ask your health care provider how often you should have your eyes checked.  Personal lifestyle choices, including:  Daily care of your teeth and gums.  Regular physical activity.  Eating a healthy diet.  Avoiding tobacco and drug use.  Limiting alcohol use.  Practicing safe sex.  Taking low doses of aspirin every day.  Taking vitamin and mineral supplements as recommended by your health care provider. What happens during an annual well check? The services and screenings done by your health care provider during your annual well check will depend on your age, overall health, lifestyle risk factors, and family history of disease. Counseling  Your health care provider may ask you questions about your:  Alcohol use.  Tobacco use.  Drug  use.  Emotional well-being.  Home and relationship well-being.  Sexual activity.  Eating habits.  History of falls.  Memory and ability to understand (cognition).  Work and work Statistician. Screening  You may have the following tests or measurements:  Height, weight, and BMI.  Blood pressure.  Lipid and cholesterol levels. These may be checked every 5 years, or more frequently if you are over 63 years old.  Skin check.  Lung cancer screening. You may have this screening every year starting at age 30 if you have a 30-pack-year history of smoking and currently smoke or have quit within the past 15 years.  Fecal occult blood test (FOBT) of the stool. You may have this test every year starting at age 75.  Flexible sigmoidoscopy or colonoscopy. You may have a sigmoidoscopy every 5 years or a colonoscopy every 10 years starting at age 8.  Prostate cancer screening. Recommendations will vary depending on your family history and other risks.  Hepatitis C blood test.  Hepatitis B blood test.  Sexually transmitted disease (STD) testing.  Diabetes screening. This is done by checking your blood sugar (glucose) after you have not eaten for a while (fasting). You may have this done every 1-3 years.  Abdominal aortic aneurysm (AAA) screening. You may need this if you are a current or former smoker.  Osteoporosis. You may be screened starting at age 63 if you are at high risk. Talk with your health care provider about your test results, treatment options, and if necessary, the need for more tests. Vaccines  Your health care provider may recommend certain vaccines, such as:  Influenza vaccine. This is recommended every year.  Tetanus, diphtheria, and  acellular pertussis (Tdap, Td) vaccine. You may need a Td booster every 10 years.  Zoster vaccine. You may need this after age 6.  Pneumococcal 13-valent conjugate (PCV13) vaccine. One dose is recommended after age  26.  Pneumococcal polysaccharide (PPSV23) vaccine. One dose is recommended after age 52. Talk to your health care provider about which screenings and vaccines you need and how often you need them. This information is not intended to replace advice given to you by your health care provider. Make sure you discuss any questions you have with your health care provider. Document Released: 10/11/2015 Document Revised: 06/03/2016 Document Reviewed: 07/16/2015 Elsevier Interactive Patient Education  2017 [place] Prevention in the Home Falls can cause injuries. They can happen to people of all ages. There are many things you can do to make your home safe and to help prevent falls. What can I do on the outside of my home?  Regularly fix the edges of walkways and driveways and fix any cracks.  Remove anything that might make you trip as you walk through a door, such as a raised step or threshold.  Trim any bushes or trees on the path to your home.  Use bright outdoor lighting.  Clear any walking paths of anything that might make someone trip, such as rocks or tools.  Regularly check to see if handrails are loose or broken. Make sure that both sides of any steps have handrails.  Any raised decks and porches should have guardrails on the edges.  Have any leaves, snow, or ice cleared regularly.  Use sand or salt on walking paths during winter.  Clean up any spills in your garage right away. This includes oil or grease spills. What can I do in the bathroom?  Use night lights.  Install grab bars by the toilet and in the tub and shower. Do not use towel bars as grab bars.  Use non-skid mats or decals in the tub or shower.  If you need to sit down in the shower, use a plastic, non-slip stool.  Keep the floor dry. Clean up any water that spills on the floor as soon as it happens.  Remove soap buildup in the tub or shower regularly.  Attach bath mats securely with double-sided  non-slip rug tape.  Do not have throw rugs and other things on the floor that can make you trip. What can I do in the bedroom?  Use night lights.  Make sure that you have a light by your bed that is easy to reach.  Do not use any sheets or blankets that are too big for your bed. They should not hang down onto the floor.  Have a firm chair that has side arms. You can use this for support while you get dressed.  Do not have throw rugs and other things on the floor that can make you trip. What can I do in the kitchen?  Clean up any spills right away.  Avoid walking on wet floors.  Keep items that you use a lot in easy-to-reach places.  If you need to reach something above you, use a strong step stool that has a grab bar.  Keep electrical cords out of the way.  Do not use floor polish or wax that makes floors slippery. If you must use wax, use non-skid floor wax.  Do not have throw rugs and other things on the floor that can make you trip. What can I do with my stairs?  Do not leave any items on the stairs.  Make sure that there are handrails on both sides of the stairs and use them. Fix handrails that are broken or loose. Make sure that handrails are as long as the stairways.  Check any carpeting to make sure that it is firmly attached to the stairs. Fix any carpet that is loose or worn.  Avoid having throw rugs at the top or bottom of the stairs. If you do have throw rugs, attach them to the floor with carpet tape.  Make sure that you have a light switch at the top of the stairs and the bottom of the stairs. If you do not have them, ask someone to add them for you. What else can I do to help prevent falls?  Wear shoes that:  Do not have high heels.  Have rubber bottoms.  Are comfortable and fit you well.  Are closed at the toe. Do not wear sandals.  If you use a stepladder:  Make sure that it is fully opened. Do not climb a closed stepladder.  Make sure that both  sides of the stepladder are locked into place.  Ask someone to hold it for you, if possible.  Clearly mark and make sure that you can see:  Any grab bars or handrails.  First and last steps.  Where the edge of each step is.  Use tools that help you move around (mobility aids) if they are needed. These include:  Canes.  Walkers.  Scooters.  Crutches.  Turn on the lights when you go into a dark area. Replace any light bulbs as soon as they burn out.  Set up your furniture so you have a clear path. Avoid moving your furniture around.  If any of your floors are uneven, fix them.  If there are any pets around you, be aware of where they are.  Review your medicines with your doctor. Some medicines can make you feel dizzy. This can increase your chance of falling. Ask your doctor what other things that you can do to help prevent falls. This information is not intended to replace advice given to you by your health care provider. Make sure you discuss any questions you have with your health care provider. Document Released: 07/11/2009 Document Revised: 02/20/2016 Document Reviewed: 10/19/2014 Elsevier Interactive Patient Education  2017 [employer].

## 2021-02-17 ENCOUNTER — Encounter: Payer: Self-pay | Admitting: Family Medicine

## 2021-02-18 DIAGNOSIS — J301 Allergic rhinitis due to pollen: Secondary | ICD-10-CM | POA: Diagnosis not present

## 2021-02-18 DIAGNOSIS — J3089 Other allergic rhinitis: Secondary | ICD-10-CM | POA: Diagnosis not present

## 2021-02-25 DIAGNOSIS — J3089 Other allergic rhinitis: Secondary | ICD-10-CM | POA: Diagnosis not present

## 2021-02-25 DIAGNOSIS — J301 Allergic rhinitis due to pollen: Secondary | ICD-10-CM | POA: Diagnosis not present

## 2021-02-27 DIAGNOSIS — J3081 Allergic rhinitis due to animal (cat) (dog) hair and dander: Secondary | ICD-10-CM | POA: Diagnosis not present

## 2021-02-27 DIAGNOSIS — J3089 Other allergic rhinitis: Secondary | ICD-10-CM | POA: Diagnosis not present

## 2021-02-27 DIAGNOSIS — J301 Allergic rhinitis due to pollen: Secondary | ICD-10-CM | POA: Diagnosis not present

## 2021-03-04 DIAGNOSIS — J3089 Other allergic rhinitis: Secondary | ICD-10-CM | POA: Diagnosis not present

## 2021-03-04 DIAGNOSIS — J301 Allergic rhinitis due to pollen: Secondary | ICD-10-CM | POA: Diagnosis not present

## 2021-03-10 DIAGNOSIS — J3089 Other allergic rhinitis: Secondary | ICD-10-CM | POA: Diagnosis not present

## 2021-03-10 DIAGNOSIS — M48062 Spinal stenosis, lumbar region with neurogenic claudication: Secondary | ICD-10-CM | POA: Diagnosis not present

## 2021-03-10 DIAGNOSIS — J301 Allergic rhinitis due to pollen: Secondary | ICD-10-CM | POA: Diagnosis not present

## 2021-03-11 ENCOUNTER — Other Ambulatory Visit: Payer: Self-pay

## 2021-03-11 ENCOUNTER — Ambulatory Visit: Payer: Medicare HMO | Admitting: Cardiology

## 2021-03-11 ENCOUNTER — Encounter: Payer: Self-pay | Admitting: Cardiology

## 2021-03-11 VITALS — BP 120/78 | HR 67 | Ht 74.0 in | Wt 212.0 lb

## 2021-03-11 DIAGNOSIS — I2510 Atherosclerotic heart disease of native coronary artery without angina pectoris: Secondary | ICD-10-CM | POA: Diagnosis not present

## 2021-03-11 DIAGNOSIS — E7800 Pure hypercholesterolemia, unspecified: Secondary | ICD-10-CM | POA: Diagnosis not present

## 2021-03-11 DIAGNOSIS — I10 Essential (primary) hypertension: Secondary | ICD-10-CM

## 2021-03-11 MED ORDER — ROSUVASTATIN CALCIUM 20 MG PO TABS
20.0000 mg | ORAL_TABLET | Freq: Every day | ORAL | 3 refills | Status: DC
Start: 2021-03-11 — End: 2021-05-10

## 2021-03-11 MED ORDER — METOPROLOL TARTRATE 25 MG PO TABS
12.5000 mg | ORAL_TABLET | Freq: Two times a day (BID) | ORAL | 3 refills | Status: DC
Start: 2021-03-11 — End: 2021-10-31

## 2021-03-11 NOTE — Progress Notes (Signed)
Date:  03/11/2021   ID:  Brandon Haynes, DOB Oct 24, 1940, MRN [id number]   PCP:  Tammi Sou, MD  Cardiologist:  Fransico Him, MD Electrophysiologist:  None   Chief Complaint:  CAD, HTN, lipids  History of Present Illness:     Brandon Haynes is a 80 y.o. 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 The RCA was felt well collateralized and he underwent PCI of the LAD.  LV function was 55% at time of cath.  Echocardiogram revealed akinesis of the basal-mid inferoseptal myocardium and  mid-apical anteroseptal myocardium with EF 50-55%.  His last nuclear stress test showed no ischemia. His angina has presented as CP and hypotension in the past.    He is here today for followup and is doing well.  He denies any chest pain or pressure, SOB, DOE, PND, orthopnea, LE edema, dizziness, palpitations or syncope. He is compliant with his meds and is tolerating meds with no SE.    Prior CV studies:   The following studies were reviewed today:  EKG  Past Medical History:  Diagnosis Date   Allergic rhinitis    allergy shots via [facility]   Anxiety    Bladder cancer ([place]) 05/2019   INVASIVE HIGH GRADE PAPILLARY UROTHELIAL CARCINOMA, invading muscularis propia.  Lesion resected. No mets.  He got neoadjuvant chemo x 4 cycles.  Cystoprostectomy with ileal conduit formation 12/13/19 (Dr. Tresa Moore). 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 difficile colitis 01/09/2020; late May 2021   oral vancomycin   CAD, multiple vessel    a. CP/near-syncope 11/2014 s/p DES to MLAD, occluded RCA after takeoff of RV marginal with collaterals treated medically, otherwise mild nonobstructive disease. EF 55%;  b. 07/2015 Lexiscan CL: EF 56%, no ischemia/infarct.    Cataracts, both eyes    Chronic constipation    worse since ileal conduit surgery was done 2020   Chronic low back pain    DDD (degenerative disc disease), lumbar    GERD (gastroesophageal  reflux disease)    PPI bid needed to control sx's   Gout    Hearing loss    History  of basal cell carcinoma    History of adenomatous polyp of colon    Rpt colonoscopy recommended 02/2020->no further if no high risk lesions at that time.   History of prostate cancer 11/2019   grd 2 cancer with neg margins incidental on cystectomy path 11/2019.   Hypercholesteremia    Hypertension    Hx labile/uncontrolled.  Improved s/p visit to HTN clinic 2019.   Iron deficiency anemia 2017   Colonoscopy, EGD, and capsule endoscopy w/out obvious cause/source found.  Oral iron helping as of 03/2016.   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 No avid enhancement or mass effect. Obs by urol as of 10/2019 and 11/2020.   Past Surgical History:  Procedure Laterality Date   back injection  oct 2011, 07/2010, 12/2012   no surgery   Capsule endoscopy  02/2016   mild duodenitis, o/w NEG   CARDIOVASCULAR STRESS TEST  05/2009; 07/2015   carotid dopplers  11/2014   Bilateral - 1% to 9% ICA stenosis. Vertebral  artery flow is   CATARACT EXTRACTION, BILATERAL     Jan, Feb 2022   COLONOSCOPY  08/2008; 02/2016; 02/2017; 05/27/20   2017: 12 polyps (adenomatous).  02/2017 repeat TCS--multiple polyps. 04/2020 +adenomas.   CORONARY ANGIOPLASTY WITH STENT PLACEMENT  12/04/2014   4.0 x 12 mm Synergy stent to the mid LAD   CYSTOSCOPY  05/24/2019   CYSTOSCOPY W/ TRANSURETHRAL RESECTION OF POSTERIOR URETHERAL VALVES  06/12/2019   2-5cm   CYSTOSCOPY WITH INJECTION N/A 12/13/2019   Procedure: CYSTOSCOPY WITH INJECTION;  Surgeon: [name], MD;  Location: [facility];  Service: Urology;  Laterality: N/A;   ESOPHAGOGASTRODUODENOSCOPY  02/2016   Gastritis.  NEG h pylori.     IR IMAGING GUIDED PORT INSERTION  09/12/2019    IR REMOVAL TUN ACCESS W/ PORT W/O FL MOD SED  08/20/2020   LE arterial vascular study  02/2016   Per Dr. Radford Pax, no evidence of peripheral vascular disease   LEFT HEART CATHETERIZATION WITH CORONARY ANGIOGRAM N/A 12/04/2014   Procedure: LEFT HEART CATHETERIZATION WITH CORONARY ANGIOGRAM;  Surgeon: Jolaine Artist, MD; LAD 99% then aneurysmal, OM1 40%, CFX 50%, RCA 40%, 50% then occluded   LYMPHADENECTOMY Bilateral 12/13/2019   Procedure: LYMPHADENECTOMY;  Surgeon: [name], MD;  Location: [facility];  Service: Urology;  Laterality: Bilateral;   ROBOT ASSISTED LAPAROSCOPIC COMPLETE CYSTECT ILEAL CONDUIT N/A 12/13/2019   Procedure: XI ROBOTIC ASSISTED LAPAROSCOPIC COMPLETE CYSTECT ILEAL CONDUIT AND RADICAL PROSTATECTOMY;  Surgeon: [name], MD;  Location: [facility];  Service: Urology;  Laterality: N/A;  6 HRS   ROTATOR CUFF REPAIR Left 09/2013   left   SHOULDER INJECTION  feb 2014   TRANSTHORACIC ECHOCARDIOGRAM  11/2014   EF 50-55%, some wall motion abnormalities noted, grade I diast dysfxn   TRANSURETHRAL RESECTION OF BLADDER TUMOR N/A 06/12/2019   INVASIVE HIGH GRADE PAPILLARY UROTHELIAL CARCINOMA, invading muscularis propria.  Procedure: TRANSURETHRAL RESECTION OF BLADDER TUMOR (TURBT);  Surgeon: Lucas Mallow, MD;  Location: [facility];  Service: Urology;  Laterality: N/A;     Current Meds  Medication Sig   allopurinol (ZYLOPRIM) 300 MG tablet TAKE 1 TABLET BY MOUTH EVERY DAY   aspirin EC 81 MG tablet Take 81 mg by mouth daily.   desonide (DESOWEN) 0.05 % cream Apply 1 application topically 3 (three) times a week.    diphenhydrAMINE (BENADRYL) 25 MG tablet Take 50 mg by mouth 3 (three) times daily as needed for itching or allergies.    fluticasone (FLONASE) 50 MCG/ACT nasal spray Place 1 spray into both nostrils daily.   gabapentin (NEURONTIN) 300 MG capsule Take 300-600 mg by mouth See admin instructions. Take 2 capsules (600 mg) by mouth in the morning & take 1 capsule (300 mg) by  mouth in the afternoon.   loratadine (CLARITIN) 10 MG tablet Take 20 mg by mouth daily.   metoprolol tartrate (LOPRESSOR) 25 MG tablet Take 0.5 tablets (12.5 mg total) by mouth 2 (two) times daily.   nitroGLYCERIN (NITROSTAT) 0.4 MG SL tablet PLACE 1 TABLET UNDER TONGUE EVERY 5 MINS, UP TO 3 DOSES AS NEEDED FOR CHEST PAIN   pantoprazole (PROTONIX) 40 MG tablet TAKE 1 TABLET BY MOUTH EVERY DAY   PREVIDENT 5000 BOOSTER PLUS 1.1 % PSTE Take 1 application by mouth at bedtime.    rosuvastatin (CRESTOR) 20 MG tablet TAKE 1 TABLET BY MOUTH EVERY DAY   traMADol (ULTRAM) 50 MG tablet TAKE 1 TABLET BY MOUTH 3 TIMES A DAY AS NEEDED FOR PAIN  Allergies:   Patient has no known allergies.   Social History   Tobacco Use   Smoking status: Former    Packs/day: 2.00    Years: 35.00    Pack years: 70.00    Types: Cigarettes    Quit date: 09/28/1998    Years since quitting: 22.4   Smokeless tobacco: Former    Types: Snuff    Quit date: 02/09/1986   Tobacco comments:    as a teenager  Vaping Use   Vaping Use: Never used  Substance Use Topics   Alcohol use: No    Alcohol/week: 0.0 standard drinks    Comment: quit 1994   Drug use: No     Family Hx: The patient's family history includes Bladder Cancer in his maternal grandfather; Breast cancer in his mother and sister; COPD in his brother; Congestive Heart Failure in his mother; Diabetes in his mother; Heart disease in his brother and father; Lung cancer in his father. There is no history of Colon cancer, Esophageal cancer, Pancreatic cancer, Rectal cancer, Stomach cancer, Prostate cancer, or Colon polyps.  ROS:   Please see the history of present illness.     All other systems reviewed and are negative.   Labs/Other Tests and Data Reviewed:    Recent Labs: 01/28/2021: ALT 13; BUN 14; Creat 1.20; Hemoglobin 12.6; Platelets 153; Potassium 3.9; Sodium 145   Recent Lipid Panel Lab Results  Component Value Date/Time   CHOL 130 01/28/2021 11:47  AM   CHOL 116 03/20/2020 08:23 AM   TRIG 135 01/28/2021 11:47 AM   HDL 60 01/28/2021 11:47 AM   HDL 62 03/20/2020 08:23 AM   CHOLHDL 2.2 01/28/2021 11:47 AM   LDLCALC 48 01/28/2021 11:47 AM    Wt Readings from Last 3 Encounters:  03/11/21 212 lb (96.2 kg)  02/05/21 213 lb (96.6 kg)  01/29/21 212 lb 9.6 oz (96.4 kg)     Objective:    Vital Signs:  BP 120/78   Pulse 67   Ht 6\' 2"  (1.88 m)   Wt 212 lb (96.2 kg)   SpO2 96%   BMI 27.22 kg/m   GEN: Well nourished, well developed in no acute distress HEENT: Normal NECK: No JVD; No carotid bruits LYMPHATICS: No lymphadenopathy CARDIAC:RRR, no murmurs, rubs, gallops RESPIRATORY:  Clear to auscultation without rales, wheezing or rhonchi  ABDOMEN: Soft, non-tender, non-distended MUSCULOSKELETAL:  No edema; No deformity  SKIN: Warm and dry NEUROLOGIC:  Alert and oriented x 3 PSYCHIATRIC:  Normal affect     EKG was peformed in the office today and showed NSR ASSESSMENT & PLAN:    1.  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 The RCA was felt well collateralized and he underwent PCI of the LAD.   -His anginal equivalent is hypotension and CP in the past.   -he has not had any anginal symptoms -continue on ASA, statin and BB  2.  Hypertension -BP is well controlled on exam today -Continue prescription drug management with Lopressor 12.5mg  BID   3.  Hyperlipidemia  - his LDL goal is < 70.   -I have personally reviewed and interpreted outside labs performed by patient's PCP which showed LDL 48, HDL 60 and ALT 13 in May 2022  -Continue prescription drug management with Crestor 20mg  daily >>refilled for 1 year  Medication Adjustments/Labs and Tests Ordered: Current medicines are reviewed at length with the patient today.  Concerns regarding medicines are  outlined above.  Tests Ordered: Orders Placed This Encounter  Procedures   EKG 12-Lead    Medication Changes: No orders of the defined  types were placed in this encounter.   Disposition:  Follow up in 1 year(s)  Signed, Fransico Him, MD  03/11/2021 3:06 PM    [facility]

## 2021-03-11 NOTE — Addendum Note (Signed)
Addended by: Patterson Hammersmith A on: 03/11/2021 03:13 PM   Modules accepted: Orders

## 2021-03-11 NOTE — Patient Instructions (Signed)

## 2021-03-17 DIAGNOSIS — J3089 Other allergic rhinitis: Secondary | ICD-10-CM | POA: Diagnosis not present

## 2021-03-17 DIAGNOSIS — J301 Allergic rhinitis due to pollen: Secondary | ICD-10-CM | POA: Diagnosis not present

## 2021-03-26 DIAGNOSIS — J3089 Other allergic rhinitis: Secondary | ICD-10-CM | POA: Diagnosis not present

## 2021-03-26 DIAGNOSIS — J301 Allergic rhinitis due to pollen: Secondary | ICD-10-CM | POA: Diagnosis not present

## 2021-03-26 DIAGNOSIS — Z8551 Personal history of malignant neoplasm of bladder: Secondary | ICD-10-CM | POA: Diagnosis not present

## 2021-03-26 DIAGNOSIS — Z936 Other artificial openings of urinary tract status: Secondary | ICD-10-CM | POA: Diagnosis not present

## 2021-03-27 DIAGNOSIS — Z936 Other artificial openings of urinary tract status: Secondary | ICD-10-CM | POA: Diagnosis not present

## 2021-03-27 DIAGNOSIS — Z8551 Personal history of malignant neoplasm of bladder: Secondary | ICD-10-CM | POA: Diagnosis not present

## 2021-04-01 DIAGNOSIS — J3089 Other allergic rhinitis: Secondary | ICD-10-CM | POA: Diagnosis not present

## 2021-04-01 DIAGNOSIS — J301 Allergic rhinitis due to pollen: Secondary | ICD-10-CM | POA: Diagnosis not present

## 2021-04-08 DIAGNOSIS — M5136 Other intervertebral disc degeneration, lumbar region: Secondary | ICD-10-CM | POA: Diagnosis not present

## 2021-04-08 DIAGNOSIS — M48062 Spinal stenosis, lumbar region with neurogenic claudication: Secondary | ICD-10-CM | POA: Diagnosis not present

## 2021-04-08 DIAGNOSIS — J3089 Other allergic rhinitis: Secondary | ICD-10-CM | POA: Diagnosis not present

## 2021-04-08 DIAGNOSIS — J301 Allergic rhinitis due to pollen: Secondary | ICD-10-CM | POA: Diagnosis not present

## 2021-04-08 DIAGNOSIS — Z6826 Body mass index (BMI) 26.0-26.9, adult: Secondary | ICD-10-CM | POA: Diagnosis not present

## 2021-04-08 DIAGNOSIS — I10 Essential (primary) hypertension: Secondary | ICD-10-CM | POA: Diagnosis not present

## 2021-04-18 DIAGNOSIS — J3089 Other allergic rhinitis: Secondary | ICD-10-CM | POA: Diagnosis not present

## 2021-04-18 DIAGNOSIS — J301 Allergic rhinitis due to pollen: Secondary | ICD-10-CM | POA: Diagnosis not present

## 2021-04-24 DIAGNOSIS — J301 Allergic rhinitis due to pollen: Secondary | ICD-10-CM | POA: Diagnosis not present

## 2021-04-24 DIAGNOSIS — J3089 Other allergic rhinitis: Secondary | ICD-10-CM | POA: Diagnosis not present

## 2021-04-30 ENCOUNTER — Encounter: Payer: Self-pay | Admitting: Family Medicine

## 2021-04-30 ENCOUNTER — Encounter (HOSPITAL_COMMUNITY): Payer: Self-pay

## 2021-04-30 ENCOUNTER — Emergency Department (HOSPITAL_COMMUNITY): Payer: Medicare HMO

## 2021-04-30 ENCOUNTER — Inpatient Hospital Stay (HOSPITAL_COMMUNITY)
Admission: EM | Admit: 2021-04-30 | Discharge: 2021-05-11 | DRG: 234 | Disposition: A | Discharge status: Home / Self Care | Payer: Medicare HMO | Attending: Thoracic Surgery (Cardiothoracic Vascular Surgery) | Admitting: Thoracic Surgery (Cardiothoracic Vascular Surgery)

## 2021-04-30 ENCOUNTER — Other Ambulatory Visit: Payer: Self-pay

## 2021-04-30 ENCOUNTER — Ambulatory Visit (INDEPENDENT_AMBULATORY_CARE_PROVIDER_SITE_OTHER): Payer: Medicare HMO | Admitting: Family Medicine

## 2021-04-30 VITALS — BP 100/60 | HR 55

## 2021-04-30 DIAGNOSIS — S0191XA Laceration without foreign body of unspecified part of head, initial encounter: Secondary | ICD-10-CM | POA: Diagnosis present

## 2021-04-30 DIAGNOSIS — G894 Chronic pain syndrome: Secondary | ICD-10-CM

## 2021-04-30 DIAGNOSIS — Z85828 Personal history of other malignant neoplasm of skin: Secondary | ICD-10-CM

## 2021-04-30 DIAGNOSIS — Z8249 Family history of ischemic heart disease and other diseases of the circulatory system: Secondary | ICD-10-CM

## 2021-04-30 DIAGNOSIS — J90 Pleural effusion, not elsewhere classified: Secondary | ICD-10-CM

## 2021-04-30 DIAGNOSIS — K5900 Constipation, unspecified: Secondary | ICD-10-CM | POA: Diagnosis not present

## 2021-04-30 DIAGNOSIS — D649 Anemia, unspecified: Secondary | ICD-10-CM

## 2021-04-30 DIAGNOSIS — F419 Anxiety disorder, unspecified: Secondary | ICD-10-CM | POA: Diagnosis present

## 2021-04-30 DIAGNOSIS — I25110 Atherosclerotic heart disease of native coronary artery with unstable angina pectoris: Secondary | ICD-10-CM

## 2021-04-30 DIAGNOSIS — Z955 Presence of coronary angioplasty implant and graft: Secondary | ICD-10-CM

## 2021-04-30 DIAGNOSIS — S199XXA Unspecified injury of neck, initial encounter: Secondary | ICD-10-CM | POA: Diagnosis not present

## 2021-04-30 DIAGNOSIS — D696 Thrombocytopenia, unspecified: Secondary | ICD-10-CM | POA: Diagnosis not present

## 2021-04-30 DIAGNOSIS — M47814 Spondylosis without myelopathy or radiculopathy, thoracic region: Secondary | ICD-10-CM | POA: Diagnosis present

## 2021-04-30 DIAGNOSIS — R001 Bradycardia, unspecified: Secondary | ICD-10-CM | POA: Diagnosis not present

## 2021-04-30 DIAGNOSIS — I214 Non-ST elevation (NSTEMI) myocardial infarction: Secondary | ICD-10-CM | POA: Diagnosis not present

## 2021-04-30 DIAGNOSIS — R079 Chest pain, unspecified: Secondary | ICD-10-CM

## 2021-04-30 DIAGNOSIS — Z951 Presence of aortocoronary bypass graft: Secondary | ICD-10-CM

## 2021-04-30 DIAGNOSIS — Z8052 Family history of malignant neoplasm of bladder: Secondary | ICD-10-CM

## 2021-04-30 DIAGNOSIS — R55 Syncope and collapse: Secondary | ICD-10-CM | POA: Diagnosis not present

## 2021-04-30 DIAGNOSIS — R7303 Prediabetes: Secondary | ICD-10-CM | POA: Diagnosis present

## 2021-04-30 DIAGNOSIS — E7800 Pure hypercholesterolemia, unspecified: Secondary | ICD-10-CM | POA: Diagnosis present

## 2021-04-30 DIAGNOSIS — I10 Essential (primary) hypertension: Secondary | ICD-10-CM | POA: Diagnosis present

## 2021-04-30 DIAGNOSIS — S0990XA Unspecified injury of head, initial encounter: Secondary | ICD-10-CM | POA: Diagnosis not present

## 2021-04-30 DIAGNOSIS — Z8551 Personal history of malignant neoplasm of bladder: Secondary | ICD-10-CM

## 2021-04-30 DIAGNOSIS — Z87891 Personal history of nicotine dependence: Secondary | ICD-10-CM

## 2021-04-30 DIAGNOSIS — Z20822 Contact with and (suspected) exposure to covid-19: Secondary | ICD-10-CM | POA: Diagnosis present

## 2021-04-30 DIAGNOSIS — M109 Gout, unspecified: Secondary | ICD-10-CM | POA: Diagnosis present

## 2021-04-30 DIAGNOSIS — E782 Mixed hyperlipidemia: Secondary | ICD-10-CM

## 2021-04-30 DIAGNOSIS — Z79899 Other long term (current) drug therapy: Secondary | ICD-10-CM

## 2021-04-30 DIAGNOSIS — I259 Chronic ischemic heart disease, unspecified: Secondary | ICD-10-CM | POA: Diagnosis not present

## 2021-04-30 DIAGNOSIS — S060X9A Concussion with loss of consciousness of unspecified duration, initial encounter: Secondary | ICD-10-CM | POA: Diagnosis present

## 2021-04-30 DIAGNOSIS — I200 Unstable angina: Secondary | ICD-10-CM

## 2021-04-30 DIAGNOSIS — W19XXXA Unspecified fall, initial encounter: Secondary | ICD-10-CM | POA: Diagnosis present

## 2021-04-30 DIAGNOSIS — Z7982 Long term (current) use of aspirin: Secondary | ICD-10-CM

## 2021-04-30 DIAGNOSIS — K219 Gastro-esophageal reflux disease without esophagitis: Secondary | ICD-10-CM | POA: Diagnosis present

## 2021-04-30 DIAGNOSIS — Z743 Need for continuous supervision: Secondary | ICD-10-CM | POA: Diagnosis not present

## 2021-04-30 DIAGNOSIS — Q250 Patent ductus arteriosus: Secondary | ICD-10-CM

## 2021-04-30 DIAGNOSIS — I739 Peripheral vascular disease, unspecified: Secondary | ICD-10-CM | POA: Diagnosis present

## 2021-04-30 DIAGNOSIS — R0789 Other chest pain: Secondary | ICD-10-CM | POA: Diagnosis not present

## 2021-04-30 DIAGNOSIS — H9190 Unspecified hearing loss, unspecified ear: Secondary | ICD-10-CM | POA: Diagnosis present

## 2021-04-30 DIAGNOSIS — D62 Acute posthemorrhagic anemia: Secondary | ICD-10-CM | POA: Diagnosis not present

## 2021-04-30 HISTORY — DX: Chest pain, unspecified: R07.9

## 2021-04-30 LAB — CBC WITH DIFFERENTIAL/PLATELET
Abs Immature Granulocytes: 0.02 10*3/uL (ref 0.00–0.07)
Basophils Absolute: 0 10*3/uL (ref 0.0–0.1)
Basophils Relative: 0 %
Eosinophils Absolute: 0.2 10*3/uL (ref 0.0–0.5)
Eosinophils Relative: 2 %
HCT: 43.3 % (ref 39.0–52.0)
Hemoglobin: 13.6 g/dL (ref 13.0–17.0)
Immature Granulocytes: 0 %
Lymphocytes Relative: 33 %
Lymphs Abs: 2.9 10*3/uL (ref 0.7–4.0)
MCH: 29.6 pg (ref 26.0–34.0)
MCHC: 31.4 g/dL (ref 30.0–36.0)
MCV: 94.1 fL (ref 80.0–100.0)
Monocytes Absolute: 0.7 10*3/uL (ref 0.1–1.0)
Monocytes Relative: 8 %
Neutro Abs: 4.9 10*3/uL (ref 1.7–7.7)
Neutrophils Relative %: 57 %
Platelets: 168 10*3/uL (ref 150–400)
RBC: 4.6 MIL/uL (ref 4.22–5.81)
RDW: 15.3 % (ref 11.5–15.5)
WBC: 8.7 10*3/uL (ref 4.0–10.5)
nRBC: 0 % (ref 0.0–0.2)

## 2021-04-30 LAB — CBC
HCT: 37.3 % — ABNORMAL LOW (ref 39.0–52.0)
Hemoglobin: 12 g/dL — ABNORMAL LOW (ref 13.0–17.0)
MCH: 30.4 pg (ref 26.0–34.0)
MCHC: 32.2 g/dL (ref 30.0–36.0)
MCV: 94.4 fL (ref 80.0–100.0)
Platelets: 127 10*3/uL — ABNORMAL LOW (ref 150–400)
RBC: 3.95 MIL/uL — ABNORMAL LOW (ref 4.22–5.81)
RDW: 15.4 % (ref 11.5–15.5)
WBC: 5.6 10*3/uL (ref 4.0–10.5)
nRBC: 0 % (ref 0.0–0.2)

## 2021-04-30 LAB — COMPREHENSIVE METABOLIC PANEL
ALT: 16 U/L (ref 0–44)
AST: 21 U/L (ref 15–41)
Albumin: 4 g/dL (ref 3.5–5.0)
Alkaline Phosphatase: 40 U/L (ref 38–126)
Anion gap: 9 (ref 5–15)
BUN: 20 mg/dL (ref 8–23)
CO2: 24 mmol/L (ref 22–32)
Calcium: 9.3 mg/dL (ref 8.9–10.3)
Chloride: 107 mmol/L (ref 98–111)
Creatinine, Ser: 1.11 mg/dL (ref 0.61–1.24)
GFR, Estimated: >60 mL/min (ref >60)
Glucose, Bld: 97 mg/dL (ref 70–99)
Potassium: 4.3 mmol/L (ref 3.5–5.1)
Sodium: 140 mmol/L (ref 135–145)
Total Bilirubin: 1 mg/dL (ref 0.3–1.2)
Total Protein: 6.7 g/dL (ref 6.5–8.1)

## 2021-04-30 LAB — BRAIN NATRIURETIC PEPTIDE: B Natriuretic Peptide: 125.7 pg/mL — ABNORMAL HIGH (ref 0.0–100.0)

## 2021-04-30 LAB — RESP PANEL BY RT-PCR (FLU A&B, COVID) ARPGX2
Influenza A by PCR: NEGATIVE
Influenza B by PCR: NEGATIVE
SARS Coronavirus 2 by RT PCR: NEGATIVE

## 2021-04-30 LAB — I-STAT CHEM 8, ED
BUN: 24 mg/dL — ABNORMAL HIGH (ref 8–23)
Calcium, Ion: 1.05 mmol/L — ABNORMAL LOW (ref 1.15–1.40)
Chloride: 109 mmol/L (ref 98–111)
Creatinine, Ser: 1.1 mg/dL (ref 0.61–1.24)
Glucose, Bld: 91 mg/dL (ref 70–99)
HCT: 40 % (ref 39.0–52.0)
Hemoglobin: 13.6 g/dL (ref 13.0–17.0)
Potassium: 4.5 mmol/L (ref 3.5–5.1)
Sodium: 141 mmol/L (ref 135–145)
TCO2: 25 mmol/L (ref 22–32)

## 2021-04-30 LAB — TROPONIN I (HIGH SENSITIVITY)
Troponin I (High Sensitivity): 5 ng/L (ref <18)
Troponin I (High Sensitivity): 5 ng/L (ref <18)

## 2021-04-30 LAB — PROTIME-INR
INR: 0.9 (ref 0.8–1.2)
Prothrombin Time: 12.5 seconds (ref 11.4–15.2)

## 2021-04-30 LAB — CREATININE, SERUM
Creatinine, Ser: 1.05 mg/dL (ref 0.61–1.24)
GFR, Estimated: >60 mL/min (ref >60)

## 2021-04-30 LAB — MAGNESIUM: Magnesium: 2.2 mg/dL (ref 1.7–2.4)

## 2021-04-30 MED ORDER — TRAMADOL HCL 50 MG PO TABS
50.0000 mg | ORAL_TABLET | Freq: Three times a day (TID) | ORAL | Status: DC | PRN
  Administered 2021-04-30 – 2021-05-02 (×2): 50 mg via ORAL
  Filled 2021-04-30 (×2): qty 1

## 2021-04-30 MED ORDER — ALLOPURINOL 300 MG PO TABS
300.0000 mg | ORAL_TABLET | Freq: Every day | ORAL | Status: DC
  Administered 2021-04-30 – 2021-05-11 (×11): 300 mg via ORAL
  Filled 2021-04-30 (×3): qty 1
  Filled 2021-04-30: qty 3
  Filled 2021-04-30 (×9): qty 1

## 2021-04-30 MED ORDER — METOPROLOL TARTRATE 12.5 MG HALF TABLET
12.5000 mg | ORAL_TABLET | Freq: Two times a day (BID) | ORAL | Status: DC
  Administered 2021-04-30 – 2021-05-05 (×11): 12.5 mg via ORAL
  Filled 2021-04-30 (×12): qty 1

## 2021-04-30 MED ORDER — ONDANSETRON HCL 4 MG/2ML IJ SOLN
4.0000 mg | Freq: Four times a day (QID) | INTRAMUSCULAR | Status: DC | PRN
Start: 2021-04-30 — End: 2021-05-06

## 2021-04-30 MED ORDER — ACETAMINOPHEN 325 MG PO TABS: 650.0000 mg | ORAL_TABLET | Freq: Four times a day (QID) | ORAL | Status: DC | PRN

## 2021-04-30 MED ORDER — ROSUVASTATIN CALCIUM 20 MG PO TABS
20.0000 mg | ORAL_TABLET | Freq: Every day | ORAL | Status: DC
  Administered 2021-04-30 – 2021-05-05 (×6): 20 mg via ORAL
  Filled 2021-04-30 (×7): qty 1

## 2021-04-30 MED ORDER — PANTOPRAZOLE SODIUM 40 MG PO TBEC
40.0000 mg | DELAYED_RELEASE_TABLET | Freq: Every day | ORAL | Status: DC
  Administered 2021-04-30 – 2021-05-05 (×6): 40 mg via ORAL
  Filled 2021-04-30 (×7): qty 1

## 2021-04-30 MED ORDER — ASPIRIN EC 81 MG PO TBEC
81.0000 mg | DELAYED_RELEASE_TABLET | Freq: Every day | ORAL | Status: DC
  Administered 2021-05-02 – 2021-05-05 (×4): 81 mg via ORAL
  Filled 2021-04-30 (×5): qty 1

## 2021-04-30 MED ORDER — GABAPENTIN 300 MG PO CAPS
300.0000 mg | ORAL_CAPSULE | Freq: Two times a day (BID) | ORAL | Status: DC
  Administered 2021-04-30 – 2021-05-11 (×21): 300 mg via ORAL
  Filled 2021-04-30 (×21): qty 1

## 2021-04-30 MED ORDER — HEPARIN SODIUM (PORCINE) 5000 UNIT/ML IJ SOLN
5000.0000 [IU] | Freq: Three times a day (TID) | INTRAMUSCULAR | Status: DC
  Administered 2021-04-30 – 2021-05-01 (×3): 5000 [IU] via SUBCUTANEOUS
  Filled 2021-04-30 (×3): qty 1

## 2021-04-30 MED ORDER — ACETAMINOPHEN 325 MG PO TABS: 650.0000 mg | ORAL_TABLET | ORAL | Status: DC | PRN

## 2021-04-30 MED ORDER — NITROGLYCERIN 0.4 MG SL SUBL: 0.4000 mg | SUBLINGUAL_TABLET | SUBLINGUAL | Status: DC | PRN

## 2021-04-30 MED ORDER — ASPIRIN EC 81 MG PO TBEC: 81.0000 mg | DELAYED_RELEASE_TABLET | Freq: Every day | ORAL | Status: DC

## 2021-04-30 NOTE — ED Notes (Signed)
Introduced myself to pt. Given a urinal. AxO x4. VSS. GCS 15. Denies needs.

## 2021-04-30 NOTE — ED Notes (Signed)
Pt's urostomy bag changed out with home supplies. Bag was emptied. Lights turned off and pt readjusted in bed. Found pt a pillow. Denies further needs.

## 2021-04-30 NOTE — H&P (Addendum)
Cardiology Admission H&P:    Patient ID: Brandon Haynes MRN: [id number]; DOB: 02-Apr-1941  Admit date: 04/30/2021 Date of Consult: 04/30/2021  PCP:  Tammi Sou, MD   [facility] Providers Cardiologist:  Fransico Him, MD   {   Patient Profile:   Brandon Haynes is a 80 y.o. male with a hx of CAD s/p LAD stent and occluded RCA with collaterals, hypertension, hyperlipidemia, chronic pain syndrome, bladder cancer s/p TURBT/chemotherapy/radical cystectomy and lymphadenectomy 12/13/19, gout, who is being seen 04/30/2021 for the evaluation of chest pain and syncope at the request of Dr. Doren Custard.   History of Present Illness:   Brandon Haynes follows Dr. Radford Pax outpatient for CAD.   He had hospitalization 12/03/2014-12/05/2014 for chest pain and syncope concerning for unstable angina.  He underwent cardiac catheterization on 12/04/2014, revealed mid LAD 99% stenosis where he was treated with DES; occluded RCA with left-to-right collaterals.  He was placed aspirin, Brilinta, Crestor, metoprolol for medical therapy. Echocardiogram on 12/04/2014 showed normal EF 50 to 55%, akinesis of basal-midinferoseptal myocardium, akinesis of mid apicalanteroseptal myocardium, grade 1 DD, trivial aortic regurg. Last NM stress Myoview from 08/24/2015 showed low risk study, no reversible ischemia, mild septal hypokinesis, LVEF 56%.   He was last seen by Dr. Radford Pax in office on 03/11/2021, he was doing well without any anginal symptoms, notes mention that his anginal equivalent symptoms are hypotension and chest pain in the past.  He is maintained on aspirin 81 mg daily, Crestor 20 mg daily, metoprolol 12.5 mg twice daily for medical therapy.  Patient was seen PCP today in the office, complained about 2 weeks onset of progressive worsening fatigue and shortness of breath with exertion.  Few hours before the office visit, he had experienced some mid-sternum chest tightness.  He felt dizzy when he stood up, had some nausea,  subsequently  passed out and hit his left forehead on the carpeted floor.  No one witnessed the fall. He felt overall poor for the past 2 weeks, felt low activity tolerance, and felt  his symptoms are similar to previous MI.  He was given 3 tablets of chewable aspirin in the office and EMS was called to transfer patient to the ER.   Admission diagnostic today revealed unremarkable CBC differential, CMP, INR 0.9.  High sensitive troponin negative x2.  BNP 125.  CT head no acute ICH. CT cervical spine revealed no acute cervical spine fracture.  Chest x-ray revealed no acute finding.  EKG revealed sinus bradycardia with ventricular rate of 56 bpm, no acute ST-T change comparing to previous EKG on 03/11/2021, T waves appear more prominent comparing to EKG on 02/20/20.  He is bradycardic with heart rate 50 to 60s, hypertensive with blood pressure up to 170/108, otherwise hemodynamically stable at ED.  Cardiology is asked to see the patient.     Past Medical History:  Diagnosis Date   Allergic rhinitis    allergy shots via [place]   Anxiety    Bladder cancer ([place]) 05/2019   INVASIVE HIGH GRADE PAPILLARY UROTHELIAL CARCINOMA, invading muscularis propia.  Lesion resected. No mets.  He got neoadjuvant chemo x 4 cycles.  Cystoprostectomy with ileal conduit formation 12/13/19 (Dr. Tresa Moore). 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 difficile colitis 01/09/2020; late May 2021   oral vancomycin   CAD, multiple vessel    a. CP/near-syncope 11/2014 s/p DES to MLAD, occluded RCA after takeoff of RV marginal with collaterals treated medically,  otherwise mild nonobstructive disease. EF 55%;  b. 07/2015 Lexiscan CL: EF 56%, no ischemia/infarct.    Cataracts, both eyes    Chronic constipation    worse since ileal conduit surgery was done 2020   Chronic low back pain    DDD (degenerative disc disease), lumbar    GERD (gastroesophageal reflux disease)    PPI bid needed to  control sx's   Gout    Hearing loss    History  of basal cell carcinoma    History of adenomatous polyp of colon    Rpt colonoscopy recommended 02/2020->no further if no high risk lesions at that time.   History of prostate cancer 11/2019   grd 2 cancer with neg margins incidental on cystectomy path 11/2019.   Hypercholesteremia    Hypertension    Hx labile/uncontrolled.  Improved s/p visit to HTN clinic 2019.   Iron deficiency anemia 2017   Colonoscopy, EGD, and capsule endoscopy w/out obvious cause/source found.  Oral iron helping as of 03/2016.   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 No avid enhancement or mass effect. Obs by urol as of 10/2019 and 11/2020.    Past Surgical History:  Procedure Laterality Date   back injection  oct 2011, 07/2010, 12/2012   no surgery   Capsule endoscopy  02/2016   mild duodenitis, o/w NEG   CARDIOVASCULAR STRESS TEST  05/2009; 07/2015   carotid dopplers  11/2014   Bilateral - 1% to 9% ICA stenosis. Vertebral artery flow is   CATARACT EXTRACTION, BILATERAL     Jan, Feb 2022   COLONOSCOPY  08/2008; 02/2016; 02/2017; 05/27/20   2017: 12 polyps (adenomatous).  02/2017 repeat TCS--multiple polyps. 04/2020 +adenomas.   CORONARY ANGIOPLASTY WITH STENT PLACEMENT  12/04/2014   4.0 x 12 mm Synergy stent to the mid LAD   CYSTOSCOPY  05/24/2019   CYSTOSCOPY W/ TRANSURETHRAL RESECTION OF POSTERIOR URETHERAL VALVES  06/12/2019   2-5cm   CYSTOSCOPY WITH INJECTION N/A 12/13/2019   Procedure: CYSTOSCOPY WITH INJECTION;  Surgeon: [name], MD;  Location: [facility];  Service: Urology;  Laterality: N/A;   ESOPHAGOGASTRODUODENOSCOPY  02/2016   Gastritis.  NEG h pylori.     IR IMAGING GUIDED PORT INSERTION  09/12/2019   IR REMOVAL TUN ACCESS W/ PORT W/O  FL MOD SED  08/20/2020   LE arterial vascular study  02/2016   Per Dr. Radford Pax, no evidence of peripheral vascular disease   LEFT HEART CATHETERIZATION WITH CORONARY ANGIOGRAM N/A 12/04/2014   Procedure: LEFT HEART CATHETERIZATION WITH CORONARY ANGIOGRAM;  Surgeon: Jolaine Artist, MD; LAD 99% then aneurysmal, OM1 40%, CFX 50%, RCA 40%, 50% then occluded   LYMPHADENECTOMY Bilateral 12/13/2019   Procedure: LYMPHADENECTOMY;  Surgeon: [name], MD;  Location: [facility];  Service: Urology;  Laterality: Bilateral;   ROBOT ASSISTED LAPAROSCOPIC COMPLETE CYSTECT ILEAL CONDUIT N/A 12/13/2019   Procedure: XI ROBOTIC ASSISTED LAPAROSCOPIC COMPLETE CYSTECT ILEAL CONDUIT AND RADICAL PROSTATECTOMY;  Surgeon: [name], MD;  Location: [facility];  Service: Urology;  Laterality: N/A;  6 HRS   ROTATOR CUFF REPAIR Left 09/2013   left   SHOULDER INJECTION  feb 2014   TRANSTHORACIC ECHOCARDIOGRAM  11/2014   EF 50-55%, some wall motion abnormalities noted, grade I  diast dysfxn   TRANSURETHRAL RESECTION OF BLADDER TUMOR N/A 06/12/2019   INVASIVE HIGH GRADE PAPILLARY UROTHELIAL CARCINOMA, invading muscularis propria.  Procedure: TRANSURETHRAL RESECTION OF BLADDER TUMOR (TURBT);  Surgeon: Lucas Mallow, MD;  Location: [facility];  Service: Urology;  Laterality: N/A;     Home Medications:  Prior to Admission medications   Medication Sig Start Date End Date Taking? Authorizing Provider  allopurinol (ZYLOPRIM) 300 MG tablet TAKE 1 TABLET BY MOUTH EVERY DAY 08/05/20   McGowen, [name], MD  aspirin EC 81 MG tablet Take 81 mg by mouth daily.    [provider]  desonide (DESOWEN) 0.05 % cream Apply 1 application topically 3 (three) times a week.  02/11/15   [provider]  diphenhydrAMINE (BENADRYL) 25 MG tablet Take 50 mg by mouth 3 (three) times daily as needed for itching or allergies.     [provider]  fluticasone (FLONASE) 50 MCG/ACT nasal spray Place 1 spray into both nostrils daily.     [provider]  gabapentin (NEURONTIN) 300 MG capsule Take 300-600 mg by mouth See admin instructions. Take 2 capsules (600 mg) by mouth in the morning & take 1 capsule (300 mg) by mouth in the afternoon.    [provider]  loratadine (CLARITIN) 10 MG tablet Take 20 mg by mouth daily.    [provider]  metoprolol tartrate (LOPRESSOR) 25 MG tablet Take 0.5 tablets (12.5 mg total) by mouth 2 (two) times daily. 03/11/21   Sueanne Margarita, MD  nitroGLYCERIN (NITROSTAT) 0.4 MG SL tablet PLACE 1 TABLET UNDER TONGUE EVERY 5 MINS, UP TO 3 DOSES AS NEEDED FOR CHEST PAIN 12/28/19   Sueanne Margarita, MD  pantoprazole (PROTONIX) 40 MG tablet TAKE 1 TABLET BY MOUTH EVERY DAY 09/02/20   Armbruster, Carlota Raspberry, MD  PREVIDENT 5000 BOOSTER PLUS 1.1 % PSTE Take 1 application by mouth at bedtime.  10/27/14   [provider]  rosuvastatin (CRESTOR) 20 MG tablet Take 1 tablet (20 mg total) by mouth daily. 03/11/21   Sueanne Margarita, MD  traMADol (ULTRAM) 50 MG tablet TAKE 1 TABLET BY MOUTH 3 TIMES A DAY AS NEEDED FOR PAIN 01/28/21   McGowen, [name], MD    Inpatient Medications: Scheduled Meds:  Continuous Infusions:  PRN Meds:   Allergies:   No Known Allergies  Social History:   Social History   Socioeconomic History   Marital status: Married    Spouse name: Vaughan Basta   Number of children: 3   Years of education: Not on file   Highest education level: Not on file  Occupational History   Occupation: retired  Tobacco Use   Smoking status: Former    Packs/day: 2.00    Years: 35.00    Pack years: 70.00    Types: Cigarettes    Quit date: 09/28/1998    Years since quitting: 22.6   Smokeless tobacco: Former    Types: Snuff    Quit date: 02/09/1986   Tobacco comments:    as a teenager  Vaping Use   Vaping Use: Never used  Substance and Sexual Activity   Alcohol use: No    Alcohol/week: 0.0 standard drinks    Comment: quit 1994   Drug use: No   Sexual activity: Not  Currently  Other Topics Concern   Not on file  Social History Narrative   Married, 3 children.   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   Orig from  [place], lived [place] same farm all his life.   Former smoker: quit approx around [place].  80 pack-yr hx.   Alcohol-none, used to drink a lot of beer on weekends.  No drugs.   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   Diet: fair   Social Determinants of Health   Financial Resource Strain: Low Risk    Difficulty of Paying Living Expenses: Not hard at all  Food Insecurity: No Food Insecurity   Worried About Charity fundraiser in the Last Year: Never true   Ran Out of Food in the Last Year: Never true  Transportation Needs: No Transportation Needs   Lack of Transportation (Medical): No   Lack of Transportation (Non-Medical): No  Physical Activity: Insufficiently Active   Days of Exercise per Week: 3 days   Minutes of Exercise per Session: 30 min  Stress: No Stress Concern Present   Feeling of Stress : Not at all  Social Connections: Moderately Integrated   Frequency of Communication with Friends and Family: More than three times a week   Frequency of Social Gatherings with Friends and Family: More than three times a week   Attends Religious Services: More than 4 times per year   Active Member of Clubs or Organizations: No   Attends Archivist Meetings: Never   Marital Status: Married  Human resources officer Violence: Not At Risk   Fear of Current or Ex-Partner: No   Emotionally Abused: No   Physically Abused: No   Sexually Abused: No    Family History:    Family History  Problem Relation Age of Onset   Lung cancer Father    Heart disease Father    Breast cancer Mother    Congestive Heart Failure Mother    Diabetes Mother    Breast cancer Sister    Heart disease Brother    COPD Brother    Bladder Cancer Maternal Grandfather    Colon cancer Neg Hx    Esophageal cancer Neg Hx    Pancreatic  cancer Neg Hx    Rectal cancer Neg Hx    Stomach cancer Neg Hx    Prostate cancer Neg Hx    Colon polyps Neg Hx      ROS:  Constitutional: see HPI  Eyes: Denied vision change or loss Ears/Nose/Mouth/Throat: Denied ear ache, sore throat, coughing, sinus pain Cardiovascular: see HPI  Respiratory: see HPI  Gastrointestinal: Denied nausea, vomiting, abdominal pain, diarrhea Genital/Urinary: Hx of bladder cancer  Musculoskeletal: Denied muscle ache, joint pain, weakness Skin: Denied rash, wound Neuro: see HPI  Psych: Denied history of depression/anxiety  Endocrine: Denied history of diabetes   Physical Exam/Data:   Vitals:   04/30/21 1545 04/30/21 1600 04/30/21 1615 04/30/21 1630  BP: (!) 145/89 (!) 146/87 (!) 129/92 (!) 150/95  Pulse: (!) 59 62 63 (!) 59  Resp: '15 11 13 11  '$ Temp:      SpO2: 98% 100% 97% 100%   No intake or output data in the 24 hours ending 04/30/21 1645 Last 3 Weights 03/11/2021 02/05/2021 01/29/2021  Weight (lbs) 212 lb 213 lb 212 lb 9.6 oz  Weight (kg) 96.163 kg 96.616 kg 96.435 kg     There is no height or weight on file to calculate BMI.   Vitals:  Vitals:   04/30/21 1615 04/30/21 1630  BP: (!) 129/92 (!) 150/95  Pulse: 63 (!) 59  Resp: 13 11  Temp:    SpO2: 97% 100%  General Appearance: In no apparent distress,sitting in bed HEENT: Normocephalic, atraumatic. EOMs intact. Left forehead laceration.  Neck: Supple, trachea midline, no JVDs Cardiovascular: Regular rate and rhythm, normal S1-S2,  no murmur/rub/gallop Respiratory: Resting breathing unlabored, lungs sounds clear to auscultation bilaterally, no use of accessory muscles. On room air.  No wheezes, rales or rhonchi.   Gastrointestinal: Bowel sounds positive, abdomen soft, non-tender, non-distended.  Extremities: Able to move all extremities in bed without difficulty, no edema/cyanosis/clubbing Genitourinary: ileal conduit with clear yellow urine  Musculoskeletal: Normal muscle bulk and  tone, muscle strength 5/5 throughout Skin: Intact, warm, dry. No rashes or petechiae noted in exposed areas.  Neurologic: Alert, oriented to person, place and time. Fluent speech,  no cognitive deficit, no gross focal neuro deficit Psychiatric: Normal affect. Mood is appropriate.    EKG:  The EKG was personally reviewed and demonstrates: Sinus bradycardia with ventricular of 56 bpm, no acute ST-T change comparing to previous EKG on 03/11/2021, prominent T waves compared to EKG on 02/20/2020  Telemetry:  Telemetry was personally reviewed and demonstrates:  Sinus rhythm with rate of 60s  Relevant CV Studies:  NM Myoveiw on 08/24/2015:  1. No reversible ischemia or infarction.   2. Mild septal hypokinesia.  No LEFT ventricular dilatation.   3. Left ventricular ejection fraction 56%   4. Low-risk stress test findings.   Echo on 12/04/2014: - Left ventricle: The cavity size was normal. There was mild focal    basal hypertrophy. Systolic function was normal. The estimated    ejection fraction was in the range of 50% to 55%. There is    akinesis of the basal-midinferoseptal myocardium. There is    akinesis of the mid-apicalanteroseptal myocardium. There was an    increased relative contribution of atrial contraction to    ventricular filling. Doppler parameters are consistent with    abnormal left ventricular relaxation (grade 1 diastolic    dysfunction).  - Aortic valve: There was trivial regurgitation.  - Mitral valve: Calcified annulus.    Cardiac Cath: 12/04/2014  Left main: Large. Normal. LAD: Very large vessel wrapping apex. Gives off 2 diagonals. Apical LAD bifurcates into a "whale tail". Mild plaque throughout. In the mid vessel there is a 99% stenosis immediately followed by an aneurysmal segment. There is a 40% stenosis after this.  LCX: Large non-dominant vessel. Gives off 2 small Ramus branches. Very large OM-1.  40% stenosis in mid OM-1 with myocardial bridging segment.  Distal AV groove LCX with 40-50% stenosis with robust L -> R collaterals. Small PL.   RCA: Dominant vessel. Mild to moderate plaque throughout. 30-40% proximal lesion. 30% mid. 50% hazy lesion around distal bend. The RCA is occluded after the take-off of a large RV marginal. The distal RCA system fills well from L-> R collaterals. LV-gram done in the RAO projection: Ejection fraction = 55% no regional wall motion abnormalities Assessment: 1. 2v CAD as described above 2. Normal EF Plan/Discussion: PCI LAD with Dr. [place]. The distal RCA is totally occluded but well collateralized from the left system. Will need aggressive risk factor management.    Laboratory Data:  High Sensitivity Troponin:   Recent Labs  Lab 04/30/21 1236 04/30/21 1308  TROPONINIHS 5 5     Chemistry Recent Labs  Lab 04/30/21 1236 04/30/21 1311  NA 140 141  K 4.3 4.5  CL 107 109  CO2 24  --   GLUCOSE 97 91  BUN 20 24*  CREATININE 1.11 1.10  CALCIUM 9.3  --  GFRNONAA >60  --   ANIONGAP 9  --     Recent Labs  Lab 04/30/21 1236  PROT 6.7  ALBUMIN 4.0  AST 21  ALT 16  ALKPHOS 40  BILITOT 1.0   Hematology Recent Labs  Lab 04/30/21 1236 04/30/21 1311  WBC 8.7  --   RBC 4.60  --   HGB 13.6 13.6  HCT 43.3 40.0  MCV 94.1  --   MCH 29.6  --   MCHC 31.4  --   RDW 15.3  --   PLT 168  --    BNP Recent Labs  Lab 04/30/21 1256  BNP 125.7*    DDimer No results for input(s): DDIMER in the last 168 hours.   Radiology/Studies:  DG Chest 2 View  Result Date: 04/30/2021 CLINICAL DATA:  Chest pain EXAM: CHEST - 2 VIEW COMPARISON:  Radiograph 11/18/2020, chest CT 02/19/2020 FINDINGS: Unchanged cardiomediastinal silhouette. No focal airspace disease. No pleural effusion or visible pneumothorax. No acute osseous abnormality. Thoracic spondylosis. IMPRESSION: No evidence of acute cardiopulmonary disease. Electronically Signed   By: Maurine Simmering   On: 04/30/2021 13:17   CT HEAD WO CONTRAST  (5MM)  Result Date: 04/30/2021 CLINICAL DATA:  Head trauma, minor (Age >= 65y) EXAM: CT HEAD WITHOUT CONTRAST TECHNIQUE: Contiguous axial images were obtained from the base of the skull through the vertex without intravenous contrast. COMPARISON:  MRI 04/22/2006 FINDINGS: Brain: No evidence of acute infarction, hemorrhage, hydrocephalus, extra-axial collection or mass lesion/mass effect.The ventricles are normal in size.Scattered subcortical and periventricular white matter hypodensities, nonspecific but likely sequela of chronic small vessel ischemic disease. Vascular: No hyperdense vessel or unexpected calcification. Skull: Normal. Negative for fracture or focal lesion. Sinuses/Orbits: Scattered paranasal sinus mucosal thickening. Other: None. IMPRESSION: No acute intracranial abnormality. Electronically Signed   By: Maurine Simmering   On: 04/30/2021 13:21   CT Cervical Spine Wo Contrast  Result Date: 04/30/2021 CLINICAL DATA:  Neck trauma (Age >= 65y) EXAM: CT CERVICAL SPINE WITHOUT CONTRAST TECHNIQUE: Multidetector CT imaging of the cervical spine was performed without intravenous contrast. Multiplanar CT image reconstructions were also generated. COMPARISON:  None. FINDINGS: Alignment: Straightening of the normal cervical lordosis. Skull base and vertebrae: But there is no acute fracture. There is no aggressive osseous lesion. Soft tissues and spinal canal: No prevertebral fluid or swelling. No visible canal hematoma. Disc levels: Multilevel degenerative disc disease, worst at C5-C6 and C6-C7.Mild multilevel facet arthropathy, with fusion of the right C2-C3 facet joints. C1-C2 degenerative changes. Upper chest: Negative. Other: None. IMPRESSION: No acute cervical spine fracture. Multilevel degenerative changes as described above. Electronically Signed   By: Maurine Simmering   On: 04/30/2021 13:24     Assessment and Plan:   Chest pain concerning for angina  CAD s/p LAD stent and occluded RCA with collaterals  2016 -Presented with chest tightness, syncope episode, DOE and fatigue x2 weeks -High sensitive troponin negative x2; BNP elevated 125 -EKG without acute ischemic change - CXR no acute finding -Last cardiac cath from 11/2014 with mid LAD 99% stenosis treated with DES; occluded RCA with left-to-right collaterals - Presentation similar to previous unstable angina in 2016, concerning for angina equivalent - will admit to telemetry unit - Plan repeat cardiac catheterization tomorrow, NPO after midnight, agreeable  - Repeat echocardiogram - Medical therapy: Continue aspirin 81 mg daily, metoprolol 12.5 mg twice daily, Crestor 20 mg daily - LDL from 01/28/21 was 48 at goal  Head laceration and concussion Syncope and collapse -CT  head without acute ICH -CT cervical spine without acute fracture -Cardiac work-up for syncope as above -Monitor neuro exam q4h for 24 hours, repeat CT if acute neurological change occurs  Hypertension -Blood pressure mildly elevated, will resume home medication metoprolol, trend BP  Hyperlipidemia -LDL from 01/28/21 at goal, continue Crestor 20 mg daily  History of bladder cancer -No acute issue at this time, follow-up with urology as scheduled  Gout prophylaxis -Resume home medicine allopurinol  Chronic pain syndrome -As needed Tylenol and resume home medicine as needed tramadol  GERD -Resume PPI  Fatigue - check TSH , b12    Shared Decision Making/Informed Consent{  The risks [stroke (1 in 1000), death (1 in 1000), kidney failure [usually temporary] (1 in 500), bleeding (1 in 200), allergic reaction [possibly serious] (1 in 200)], benefits (diagnostic support and management of coronary artery disease) and alternatives of a cardiac catheterization were discussed in detail with Brandon Haynes and he is willing to proceed.    Risk Assessment/Risk Scores:     HEAR Score (for undifferentiated chest pain):  HEAR Score: 5{      For questions or updates,  please contact [place] Please consult [email or web address] for contact info under    Signed, Margie Billet, NP  04/30/2021 4:45 PM  Agree with note by Margie Billet NP  Brandon Haynes is a 80 year old Caucasian male patient of Dr. Theodosia Blender with a history of CAD status post LAD intervention back in 2016.  He has a known occluded RCA with left-to-right collaterals.  Other problems as outlined including treated hypertension, hyperlipidemia, GERD and bladder cancer.  He is seen today for evaluation of syncope.  Apparently fell and hit his head.  He is been experiencing some atypical chest pain and severe fatigue which were very similar to his symptoms prior to his LAD stent.  He is currently pain-free.  His exam is benign.  His enzymes are negative.  His EKG shows sinus bradycardia.  I believe given the similarity of his symptoms to his prior coronary presentation we should proceed with diagnostic coronary angiography tomorrow. The patient understands that risks included but are not limited to stroke (1 in 1000), death (1 in 42), kidney failure [usually temporary] (1 in 500), bleeding (1 in 200), allergic reaction [possibly serious] (1 in 200).  The patient understands and agrees to proceed   Lorretta Harp, M.D., [place], [facility], [place], [place] [address] [place], [place]  [id number]  [phone] 04/30/2021 5:40 PM

## 2021-04-30 NOTE — Progress Notes (Signed)
OFFICE VISIT  04/30/2021  CC:  Chief Complaint  Patient presents with   Follow-up  CHEST PAIN  HPI:    Patient is a 80 y.o. Caucasian male who presents accompanied by his wife Vaughan Basta for 3 mo f/u chronic pain syndrome and HTN. However, he has complaint of chest pain today.  HPI: Progressive fatigue and DOE last 2 wks. A few hours ago developed SSCP and tightness across chest at rest.  He stood up and felt dizzy and passed out and hit his L forehead region.  No change in chest symptoms with ambulation, no change with taking 1 SL NTG tab about 2 hours ago.  No jaw or arm pain, no nausea or palpitations. He has some feeling of SOB + DOE today.  No recent fevers, cough, or LE swelling or pain. He took one '81mg'$  ASA at home today. Says eating less than normal lately, sayd he hydrates well but admits he has been working out in the heat still lately--much less able to tolerate this lately, though.  ROS as above, plus-->  no wheezing, no HAs, no rashes, no melena/hematochezia.  No polyuria or polydipsia.  No myalgias or arthralgias.  No focal weakness, paresthesias, or tremors.  No acute vision or hearing abnormalities.  No dysuria or unusual/new urinary urgency or frequency.  No recent changes in lower legs. No n/v/d or abd pain.   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  Lesion resected. No mets.  He got neoadjuvant chemo x 4 cycles.  Cystoprostectomy with ileal conduit formation 12/13/19 (Dr. Tresa Moore). 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 difficile colitis 01/09/2020; late May 2021   oral vancomycin   CAD, multiple vessel    a. CP/near-syncope 11/2014 s/p DES to MLAD, occluded RCA after takeoff of RV marginal with collaterals treated medically, otherwise mild nonobstructive  disease. EF 55%;  b. 07/2015 Lexiscan CL: EF 56%, no ischemia/infarct.    Cataracts, both eyes    Chronic constipation    worse since ileal conduit surgery was done 2020   Chronic low back pain    DDD (degenerative disc disease), lumbar    GERD (gastroesophageal reflux disease)    PPI bid needed to control sx's   Gout    Hearing loss    History  of basal cell carcinoma    History of adenomatous polyp of colon    Rpt colonoscopy recommended 02/2020->no further if no high risk lesions at that time.   History of prostate cancer 11/2019   grd 2 cancer with neg margins incidental on cystectomy path 11/2019.   Hypercholesteremia    Hypertension    Hx labile/uncontrolled.  Improved s/p visit to HTN clinic 2019.   Iron deficiency anemia 2017   Colonoscopy, EGD, and capsule endoscopy w/out obvious cause/source found.  Oral iron helping as of 03/2016.   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 No avid enhancement or mass effect. Obs by urol as of 10/2019 and 11/2020.    Past Surgical History:  Procedure Laterality Date  back injection  oct 2011, 07/2010, 12/2012   no surgery   Capsule endoscopy  02/2016   mild duodenitis, o/w NEG   CARDIOVASCULAR STRESS TEST  05/2009; 07/2015   carotid dopplers  11/2014   Bilateral - 1% to 9% ICA stenosis. Vertebral artery flow is   CATARACT EXTRACTION, BILATERAL     Jan, Feb 2022   COLONOSCOPY  08/2008; 02/2016; 02/2017; 05/27/20   2017: 12 polyps (adenomatous).  02/2017 repeat TCS--multiple polyps. 04/2020 +adenomas.   CORONARY ANGIOPLASTY WITH STENT PLACEMENT  12/04/2014   4.0 x 12 mm Synergy stent to the mid LAD   CYSTOSCOPY  05/24/2019   CYSTOSCOPY W/ TRANSURETHRAL RESECTION OF POSTERIOR URETHERAL VALVES  06/12/2019   2-5cm   CYSTOSCOPY  WITH INJECTION N/A 12/13/2019   Procedure: CYSTOSCOPY WITH INJECTION;  Surgeon: [name], MD;  Location: [facility];  Service: Urology;  Laterality: N/A;   ESOPHAGOGASTRODUODENOSCOPY  02/2016   Gastritis.  NEG h pylori.     IR IMAGING GUIDED PORT INSERTION  09/12/2019   IR REMOVAL TUN ACCESS W/ PORT W/O FL MOD SED  08/20/2020   LE arterial vascular study  02/2016   Per Dr. Radford Pax, no evidence of peripheral vascular disease   LEFT HEART CATHETERIZATION WITH CORONARY ANGIOGRAM N/A 12/04/2014   Procedure: LEFT HEART CATHETERIZATION WITH CORONARY ANGIOGRAM;  Surgeon: Jolaine Artist, MD; LAD 99% then aneurysmal, OM1 40%, CFX 50%, RCA 40%, 50% then occluded   LYMPHADENECTOMY Bilateral 12/13/2019   Procedure: LYMPHADENECTOMY;  Surgeon: [name], MD;  Location: [facility];  Service: Urology;  Laterality: Bilateral;   ROBOT ASSISTED LAPAROSCOPIC COMPLETE CYSTECT ILEAL CONDUIT N/A 12/13/2019   Procedure: XI ROBOTIC ASSISTED LAPAROSCOPIC COMPLETE CYSTECT ILEAL CONDUIT AND RADICAL PROSTATECTOMY;  Surgeon: [name], MD;  Location: [facility];  Service: Urology;  Laterality: N/A;  6 HRS   ROTATOR CUFF REPAIR Left 09/2013   left   SHOULDER INJECTION  feb 2014   TRANSTHORACIC ECHOCARDIOGRAM  11/2014   EF 50-55%, some wall motion abnormalities noted, grade I diast dysfxn   TRANSURETHRAL RESECTION OF BLADDER TUMOR N/A 06/12/2019   INVASIVE HIGH GRADE PAPILLARY UROTHELIAL CARCINOMA, invading muscularis propria.  Procedure: TRANSURETHRAL RESECTION OF BLADDER TUMOR (TURBT);  Surgeon: Lucas Mallow, MD;  Location: [facility];  Service: Urology;  Laterality: N/A;    Outpatient Medications Prior to Visit  Medication Sig Dispense Refill   nitroGLYCERIN (NITROSTAT) 0.4 MG SL tablet PLACE 1 TABLET UNDER TONGUE EVERY 5 MINS, UP TO 3 DOSES AS NEEDED FOR CHEST PAIN 25 tablet 1   allopurinol (ZYLOPRIM) 300 MG tablet TAKE 1 TABLET BY MOUTH EVERY DAY 90 tablet 3   aspirin EC 81 MG tablet Take 81 mg by mouth daily.      desonide (DESOWEN) 0.05 % cream Apply 1 application topically 3 (three) times a week.   1   diphenhydrAMINE (BENADRYL) 25 MG tablet Take 50 mg by mouth 3 (three) times daily as needed for itching or allergies.      fluticasone (FLONASE) 50 MCG/ACT nasal spray Place 1 spray into both nostrils daily.     gabapentin (NEURONTIN) 300 MG capsule Take 300-600 mg by mouth See admin instructions. Take 2 capsules (600 mg) by mouth in the morning & take 1 capsule (300 mg) by mouth in the afternoon.     loratadine (CLARITIN) 10 MG tablet Take 20 mg by mouth daily.     metoprolol tartrate (LOPRESSOR) 25 MG tablet Take 0.5 tablets (12.5 mg total) by mouth 2 (  two) times daily. 180 tablet 3   pantoprazole (PROTONIX) 40 MG tablet TAKE 1 TABLET BY MOUTH EVERY DAY 90 tablet 1   PREVIDENT 5000 BOOSTER PLUS 1.1 % PSTE Take 1 application by mouth at bedtime.   1   rosuvastatin (CRESTOR) 20 MG tablet Take 1 tablet (20 mg total) by mouth daily. 90 tablet 3   traMADol (ULTRAM) 50 MG tablet TAKE 1 TABLET BY MOUTH 3 TIMES A DAY AS NEEDED FOR PAIN 90 tablet 5   No facility-administered medications prior to visit.    No Known Allergies  ROS As per HPI  PE: Vitals with BMI 04/30/2021 03/11/2021 02/05/2021  Height - '6\' 2"'$  '6\' 2"'$   Weight - 212 lbs 213 lbs  BMI - [id number]  Systolic [id number]  Diastolic 60 78 82  Pulse 55 67 -   Gen: Alert, well appearing/NAD, conversational.  Patient is oriented to person, place, time, and situation. AFFECT: pleasant, lucid thought and speech. [id number]: no injection, icteris, swelling, or exudate.  EOMI, PERRLA. Mouth: lips without lesion/swelling.  Oral mucosa pink and moist. Oropharynx without erythema, exudate, or swelling.  CV: RRR, no m/r/g.   LUNGS: CTA bilat, nonlabored resps, good aeration in all lung fields. ABD: soft, NT/ND, urine bag attached to ostomy w/out abnormality, urine dark yellow. EXT: no clubbing or cyanosis.  no edema.    LABS:  Lab Results  Component  Value Date   TSH 1.946 01/08/2020   Lab Results  Component Value Date   WBC 6.8 01/28/2021   HGB 12.6 (L) 01/28/2021   HCT 38.1 (L) 01/28/2021   MCV 90.5 01/28/2021   PLT 153 01/28/2021   Lab Results  Component Value Date   IRON 201 (H) 08/18/2016   FERRITIN 74.6 08/18/2016   Lab Results  Component Value Date   CREATININE 1.20 (H) 01/28/2021   BUN 14 01/28/2021   NA 145 01/28/2021   K 3.9 01/28/2021   CL 108 01/28/2021   CO2 26 01/28/2021   Lab Results  Component Value Date   ALT 13 01/28/2021   AST 16 01/28/2021   ALKPHOS 50 07/31/2020   BILITOT 0.6 01/28/2021   Lab Results  Component Value Date   CHOL 130 01/28/2021   Lab Results  Component Value Date   HDL 60 01/28/2021   Lab Results  Component Value Date   LDLCALC 48 01/28/2021   Lab Results  Component Value Date   TRIG 135 01/28/2021   Lab Results  Component Value Date   CHOLHDL 2.2 01/28/2021   Lab Results  Component Value Date   PSA <0.015 11/12/2020   PSA <0.015 06/04/2020   PSA 2.13 03/22/2014   Lab Results  Component Value Date   HGBA1C 5.2 04/05/2019   12 lead EKG today: SR, rate 50, no ischemic changes, QRS duration normal, QT and PR intervals normal.  Essentially normal EKG.  No change compared to 03/12/21.  IMPRESSION AND PLAN:  1) Chest pain, acute, in pt with known CAD+hx of LAD stent + moderate CAD other arteries on last cath 2016.  EKG today normal. No hx of cardiomyopathy. Progressive fatigue and DOE point to possible onset of ischemia 2 wks ago. Needs further eval in emergency room setting so I've had him transported via EMS today. Pt with soft bp here today so I didn't give any NTG.  I gave 3 chewable ASA in office today.  An After Visit Summary was printed and given to the patient.  FOLLOW UP:  Return for f/u to be determined based on ED eval today. Next CPE 01/2022  Signed:  Crissie Sickles, MD           04/30/2021

## 2021-04-30 NOTE — ED Provider Notes (Signed)
Emergency Medicine Provider Triage Evaluation Note  Brandon Haynes , a 80 y.o. male  was evaluated in triage.  Pt complains of chest tightness, shortness of breath, fatigue, syncope.  Patient states he has been having increasing shortness of breath, mostly with exertion for the past 2 weeks.  He has been feeling extremely fatigued for the past 2 days.  Today he stood up, took 2 steps and passed out, hitting his head.  He reports chest pain/tightness which has resolved with nitro given with EMS.  He has a history of previous heart attack, states his symptoms today feel similar to when he had a heart attack.  He follows with Dr. Radford Pax.  No fevers or chills.  No cough.  No leg swelling. He is not on blood thinners  Review of Systems  Positive: Cp, sob, syncope Negative: fever  Physical Exam  BP 127/71   Pulse (!) 57   Temp 98.2 F (36.8 C)   Resp 18   SpO2 99%  Gen:   Awake, no distress   Resp:  Normal effort, clear lung sounds MSK:   Moves extremities without difficulty  Other:  Abrasion of the left forehead.  Medical Decision Making  Medically screening exam initiated at 12:30 PM.  Appropriate orders placed.  Brandon Haynes was informed that the remainder of the evaluation will be completed by another provider, this initial triage assessment does not replace that evaluation, and the importance of remaining in the ED until their evaluation is complete.  Labs, ekg, cxr, ct head    Brandon Heidelberg, PA-C 04/30/21 1231    Brandon Pick, MD 05/02/21 [phone]

## 2021-04-30 NOTE — ED Notes (Signed)
Pt readjusted in bed. Informed he'll be NPO after midnight. Given some water. Readjusted back of bed. Lights turned out.

## 2021-04-30 NOTE — ED Notes (Signed)
Pt is in Xray and will be transported to room 7 when finished.

## 2021-04-30 NOTE — ED Provider Notes (Signed)
[facility] EMERGENCY DEPARTMENT Provider Note   CSN: [id number] Arrival date & time: 04/30/21  1218     History No chief complaint on file.   Brandon Haynes is a 80 y.o. male.  HPI Patient is a 80 year old male who presents after syncopal episode.  He has had ongoing shortness of breath for the past 2 weeks.  He has had worsening fatigue over the past 2 days.  Fatigue and shortness of breath are worsened with exertion.  Earlier today he was sitting in a recliner.  He stood up and took about 2 steps.  At this time, he had a syncopal episode with only about a second of dizziness as a prodrome.  He fell forward onto carpeted floor.  His wife was at home but did not witness the fall.  When he woke up he felt just weak.  He had an abrasion to his forehead.  He was able to get up off of the floor.  He had a previously scheduled appointment with his primary care doctor which she went to.  At this point, he mentioned his recent symptoms as well as a syncopal episode.  He reports that they performed an EKG at the office and instructed him to come to the emergency department.  Patient arrives via EMS.  He did receive 4 ASA prior to arrival.  Currently, he endorses chronic lumbar back pain and continued fatigue.  He is not currently experiencing chest discomfort.  He had an MI with stent placement approximately 6 years ago.  He reports that, at the time of MI, he had similar symptoms to the ones he has been experiencing, although they were more severe at that time. HPI: A 80 year old patient with a history of hypertension presents for evaluation of chest pain. Initial onset of pain was approximately 1-3 hours ago. The patient's chest pain is not worse with exertion. The patient complains of nausea. The patient's chest pain is middle- or left-sided, is not well-localized, is not described as heaviness/pressure/tightness, is not sharp and does not radiate to the arms/jaw/neck. The patient denies  diaphoresis. The patient has no history of stroke, has no history of peripheral artery disease, has not smoked in the past 90 days, denies any history of treated diabetes, has no relevant family history of coronary artery disease (first degree relative at less than age 14), has no history of hypercholesterolemia and does not have an elevated BMI (>=30).   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  Lesion resected. No mets.  He got neoadjuvant chemo x 4 cycles.  Cystoprostectomy with ileal conduit formation 12/13/19 (Dr. Tresa Moore). 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 difficile colitis 01/09/2020; late May 2021   oral vancomycin   CAD, multiple vessel    a. CP/near-syncope 11/2014 s/p DES to MLAD, occluded RCA after takeoff of RV marginal with collaterals treated medically, otherwise mild nonobstructive disease. EF 55%;  b. 07/2015 Lexiscan CL: EF 56%, no ischemia/infarct.    Cataracts, both eyes    Chronic constipation    worse since ileal conduit surgery was done 2020   Chronic low back pain    DDD (degenerative disc disease), lumbar    GERD (gastroesophageal reflux disease)    PPI bid needed to control sx's  Gout    Hearing loss    History  of basal cell carcinoma    History of adenomatous polyp of colon    Rpt colonoscopy recommended 02/2020->no further if no high risk lesions at that time.   History of prostate cancer 11/2019   grd 2 cancer with neg margins incidental on cystectomy path 11/2019.   Hypercholesteremia    Hypertension    Hx labile/uncontrolled.  Improved s/p visit to HTN clinic 2019.   Iron deficiency anemia 2017   Colonoscopy, EGD, and capsule endoscopy w/out obvious cause/source found.  Oral iron helping as of 03/2016.   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 No avid enhancement or mass effect. Obs by urol as of 10/2019 and 11/2020.    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   Subjective tinnitus, bilateral 06/30/2016   Prediabetes 04/16/2016   Leg pain 03/06/2016   Dyspnea on exertion 08/23/2015   Dizziness 08/23/2015   Rash 08/23/2015   Coronary artery disease involving native coronary artery of native heart without angina pectoris    Syncope 12/03/2014   Osteoarthritis of multiple joints 10/18/2014   Symptomatic hypotension 03/22/2014   Goals of care, counseling/discussion 03/22/2014   Encounter to establish care with new doctor 02/26/2014   Rotator cuff tear 08/28/2013   GERD 08/29/2010   HEARING LOSS 06/21/2008   PERIPHERAL VASCULAR DISEASE 06/21/2008   ALLERGIC RHINITIS 06/21/2008   BASAL CELL CARCINOMA, HX OF 06/21/2008   COLONIC POLYPS 03/29/2008   HYPERCHOLESTEROLEMIA 03/29/2008   GOUT 03/29/2008   ANXIETY 03/29/2008   Essential hypertension 03/29/2008   DEGENERATIVE [place] DISEASE 03/29/2008   BACK PAIN, LUMBAR 03/29/2008    Past Surgical History:  Procedure Laterality Date   back injection  oct 2011, 07/2010, 12/2012   no surgery   Capsule endoscopy  02/2016   mild duodenitis, o/w NEG   CARDIOVASCULAR STRESS TEST  05/2009; 07/2015   carotid dopplers  11/2014   Bilateral - 1% to 9% ICA stenosis. Vertebral artery flow is   CATARACT  EXTRACTION, BILATERAL     Jan, Feb 2022   COLONOSCOPY  08/2008; 02/2016; 02/2017; 05/27/20   2017: 12 polyps (adenomatous).  02/2017 repeat TCS--multiple polyps. 04/2020 +adenomas.   CORONARY ANGIOPLASTY WITH STENT PLACEMENT  12/04/2014   4.0 x 12 mm Synergy stent to the mid LAD   CYSTOSCOPY  05/24/2019   CYSTOSCOPY W/ TRANSURETHRAL RESECTION OF POSTERIOR URETHERAL VALVES  06/12/2019   2-5cm   CYSTOSCOPY WITH INJECTION N/A 12/13/2019   Procedure: CYSTOSCOPY WITH INJECTION;  Surgeon: [name], MD;  Location: [facility];  Service: Urology;  Laterality: N/A;   ESOPHAGOGASTRODUODENOSCOPY  02/2016   Gastritis.  NEG h pylori.     IR IMAGING GUIDED PORT INSERTION  09/12/2019   IR REMOVAL TUN ACCESS W/ PORT W/O FL MOD SED  08/20/2020   LE arterial vascular study  02/2016   Per  Dr. Radford Pax, no evidence of peripheral vascular disease   LEFT HEART CATHETERIZATION WITH CORONARY ANGIOGRAM N/A 12/04/2014   Procedure: LEFT HEART CATHETERIZATION WITH CORONARY ANGIOGRAM;  Surgeon: Jolaine Artist, MD; LAD 99% then aneurysmal, OM1 40%, CFX 50%, RCA 40%, 50% then occluded   LYMPHADENECTOMY Bilateral 12/13/2019   Procedure: LYMPHADENECTOMY;  Surgeon: [name], MD;  Location: [facility];  Service: Urology;  Laterality: Bilateral;   ROBOT ASSISTED LAPAROSCOPIC COMPLETE CYSTECT ILEAL CONDUIT N/A 12/13/2019   Procedure: XI ROBOTIC ASSISTED LAPAROSCOPIC COMPLETE CYSTECT ILEAL CONDUIT AND RADICAL PROSTATECTOMY;  Surgeon: [name], MD;  Location: [facility];  Service: Urology;  Laterality: N/A;  6 HRS   ROTATOR CUFF REPAIR Left 09/2013   left   SHOULDER INJECTION  feb 2014   TRANSTHORACIC ECHOCARDIOGRAM  11/2014   EF 50-55%, some wall motion abnormalities noted, grade I diast dysfxn   TRANSURETHRAL RESECTION OF BLADDER TUMOR N/A 06/12/2019   INVASIVE HIGH GRADE PAPILLARY UROTHELIAL CARCINOMA, invading muscularis propria.  Procedure: TRANSURETHRAL RESECTION OF BLADDER TUMOR (TURBT);  Surgeon: Lucas Mallow, MD;   Location: [facility];  Service: Urology;  Laterality: N/A;       Family History  Problem Relation Age of Onset   Lung cancer Father    Heart disease Father    Breast cancer Mother    Congestive Heart Failure Mother    Diabetes Mother    Breast cancer Sister    Heart disease Brother    COPD Brother    Bladder Cancer Maternal Grandfather    Colon cancer Neg Hx    Esophageal cancer Neg Hx    Pancreatic cancer Neg Hx    Rectal cancer Neg Hx    Stomach cancer Neg Hx    Prostate cancer Neg Hx    Colon polyps Neg Hx     Social History   Tobacco Use   Smoking status: Former    Packs/day: 2.00    Years: 35.00    Pack years: 70.00    Types: Cigarettes    Quit date: 09/28/1998    Years since quitting: 22.6   Smokeless tobacco: Former    Types: Snuff    Quit date: 02/09/1986   Tobacco comments:    as a teenager  Vaping Use   Vaping Use: Never used  Substance Use Topics   Alcohol use: No    Alcohol/week: 0.0 standard drinks    Comment: quit 1994   Drug use: No    Home Medications Prior to Admission medications   Medication Sig Start Date End Date Taking? Authorizing Provider  acetaminophen (TYLENOL) 650 MG CR tablet Take 650 mg by mouth every 8 (eight) hours as needed for pain.   Yes [provider]  allopurinol (ZYLOPRIM) 300 MG tablet TAKE 1 TABLET BY MOUTH EVERY DAY Patient taking differently: Take 300 mg by mouth daily. 08/05/20  Yes McGowen, [name], MD  aspirin EC 81 MG tablet Take 81 mg by mouth daily.   Yes [provider]  Cholecalciferol (D3 PO) Take 1 capsule by mouth daily.   Yes [provider]  desonide (DESOWEN) 0.05 % cream Apply 1 application topically daily as needed (dry skin). 02/11/15  Yes [provider]  EPINEPHrine 0.3 mg/0.3 mL IJ SOAJ injection Inject 0.3 mg into the muscle as needed for anaphylaxis. 03/17/21  Yes [provider]  gabapentin (NEURONTIN) 300 MG capsule Take 300 mg by mouth 2 (two) times daily.    Yes [provider]  hydrocortisone  cream 1 % Apply 1 application topically 4 (four) times daily as needed for itching.   Yes [provider]  loratadine (CLARITIN) 10 MG tablet Take 20 mg by mouth daily.   Yes [provider]  metoprolol tartrate (LOPRESSOR) 25 MG tablet Take 0.5 tablets (12.5 mg total) by mouth 2 (two) times daily. 03/11/21  Yes Turner, Eber Hong, MD  nitroGLYCERIN (NITROSTAT) 0.4 MG SL tablet PLACE 1 TABLET UNDER TONGUE EVERY 5 MINS, UP TO 3 DOSES AS NEEDED FOR CHEST PAIN Patient taking differently: Place 0.4 mg under the tongue every 5 (five) minutes as needed for chest pain. 12/28/19  Yes Turner, Traci R, MD  pantoprazole (PROTONIX) 40 MG tablet TAKE 1 TABLET BY MOUTH EVERY DAY Patient taking differently: Take 40 mg by mouth daily. 09/02/20  Yes Armbruster, Carlota Raspberry, MD  PREVIDENT 5000 BOOSTER PLUS 1.1 % PSTE Take 1 application by mouth as directed. 10/27/14  Yes [provider]  rosuvastatin (CRESTOR) 20 MG tablet Take 1 tablet (20 mg total) by mouth daily. 03/11/21  Yes Turner, Eber Hong, MD  traMADol (ULTRAM) 50 MG tablet TAKE 1 TABLET BY MOUTH 3 TIMES A DAY AS NEEDED FOR PAIN Patient taking differently: Take 50-100 mg by mouth 3 (three) times daily as needed for moderate pain. 01/28/21  Yes McGowen, [name], MD  triamcinolone (NASACORT ALLERGY 24HR) 55 MCG/ACT AERO nasal inhaler Place 1 spray into the nose 2 (two) times daily as needed (allergies).   Yes [provider]    Allergies    Patient has no known allergies.  Review of Systems   Review of Systems  Constitutional:  Positive for activity change and fatigue. Negative for chills and fever.  HENT:  Negative for congestion, ear pain and sore throat.   Eyes:  Negative for pain and visual disturbance.  Respiratory:  Positive for chest tightness and shortness of breath. Negative for cough, wheezing and stridor.   Cardiovascular:  Positive for chest pain. Negative for palpitations.   Gastrointestinal:  Negative for abdominal pain, diarrhea, nausea and vomiting.  Genitourinary:  Negative for dysuria and hematuria.  Musculoskeletal:  Negative for arthralgias, back pain, gait problem, joint swelling, myalgias and neck pain.  Skin:  Negative for color change and rash.  Neurological:  Positive for syncope. Negative for seizures, weakness, numbness and headaches.  Hematological:  Does not bruise/bleed easily.  Psychiatric/Behavioral:  Negative for confusion and decreased concentration.   All other systems reviewed and are negative.  Physical Exam Updated Vital Signs BP 128/82   Pulse 81   Temp 98.2 F (36.8 C)   Resp 19   SpO2 91%   Physical Exam Vitals and nursing note reviewed.  Constitutional:      General: He is not in acute distress.    Appearance: Normal appearance. He is well-developed. He is not ill-appearing, toxic-appearing or diaphoretic.  HENT:     Head: Normocephalic.     Comments: Abrasion to left side of forehead    Right Ear: External ear normal.     Left Ear: External ear normal.     Nose: Nose normal.     Mouth/Throat:     Mouth: Mucous membranes are moist.     Pharynx: Oropharynx is clear.  Eyes:     Extraocular Movements: Extraocular movements intact.     Conjunctiva/sclera: Conjunctivae normal.  Cardiovascular:     Rate and Rhythm: Normal rate and regular rhythm.     Heart sounds: No murmur heard. Pulmonary:  Effort: Pulmonary effort is normal. No respiratory distress.     Breath sounds: Normal breath sounds. No wheezing or rales.  Chest:     Chest wall: No tenderness.  Abdominal:     Palpations: Abdomen is soft.     Tenderness: There is no abdominal tenderness. There is no guarding.  Musculoskeletal:        General: No swelling, tenderness or deformity. Normal range of motion.     Cervical back: Neck supple. No rigidity or tenderness.  Skin:    General: Skin is warm and dry.     Coloration: Skin is not jaundiced or pale.   Neurological:     General: No focal deficit present.     Mental Status: He is alert and oriented to person, place, and time.     Cranial Nerves: No cranial nerve deficit.     Sensory: No sensory deficit.     Motor: No weakness.     Coordination: Coordination normal.  Psychiatric:        Mood and Affect: Mood normal.        Behavior: Behavior normal.        Thought Content: Thought content normal.        Judgment: Judgment normal.    ED Results / Procedures / Treatments   Labs (all labs ordered are listed, but only abnormal results are displayed) Labs Reviewed  BRAIN NATRIURETIC PEPTIDE - Abnormal; Notable for the following components:      Result Value   B Natriuretic Peptide 125.7 (*)    All other components within normal limits  I-STAT CHEM 8, ED - Abnormal; Notable for the following components:   BUN 24 (*)    Calcium, Ion 1.05 (*)    All other components within normal limits  RESP PANEL BY RT-PCR (FLU A&B, COVID) ARPGX2  CBC WITH DIFFERENTIAL/PLATELET  COMPREHENSIVE METABOLIC PANEL  MAGNESIUM  PROTIME-INR  CBC  CREATININE, SERUM  BASIC METABOLIC PANEL  TSH  VITAMIN B12  TROPONIN I (HIGH SENSITIVITY)  TROPONIN I (HIGH SENSITIVITY)    EKG None  Radiology DG Chest 2 View  Result Date: 04/30/2021 CLINICAL DATA:  Chest pain EXAM: CHEST - 2 VIEW COMPARISON:  Radiograph 11/18/2020, chest CT 02/19/2020 FINDINGS: Unchanged cardiomediastinal silhouette. No focal airspace disease. No pleural effusion or visible pneumothorax. No acute osseous abnormality. Thoracic spondylosis. IMPRESSION: No evidence of acute cardiopulmonary disease. Electronically Signed   By: Maurine Simmering   On: 04/30/2021 13:17   CT HEAD WO CONTRAST (5MM)  Result Date: 04/30/2021 CLINICAL DATA:  Head trauma, minor (Age >= 65y) EXAM: CT HEAD WITHOUT CONTRAST TECHNIQUE: Contiguous axial images were obtained from the base of the skull through the vertex without intravenous contrast. COMPARISON:  MRI 04/22/2006  FINDINGS: Brain: No evidence of acute infarction, hemorrhage, hydrocephalus, extra-axial collection or mass lesion/mass effect.The ventricles are normal in size.Scattered subcortical and periventricular white matter hypodensities, nonspecific but likely sequela of chronic small vessel ischemic disease. Vascular: No hyperdense vessel or unexpected calcification. Skull: Normal. Negative for fracture or focal lesion. Sinuses/Orbits: Scattered paranasal sinus mucosal thickening. Other: None. IMPRESSION: No acute intracranial abnormality. Electronically Signed   By: Maurine Simmering   On: 04/30/2021 13:21   CT Cervical Spine Wo Contrast  Result Date: 04/30/2021 CLINICAL DATA:  Neck trauma (Age >= 65y) EXAM: CT CERVICAL SPINE WITHOUT CONTRAST TECHNIQUE: Multidetector CT imaging of the cervical spine was performed without intravenous contrast. Multiplanar CT image reconstructions were also generated. COMPARISON:  None. FINDINGS: Alignment: Straightening of  the normal cervical lordosis. Skull base and vertebrae: But there is no acute fracture. There is no aggressive osseous lesion. Soft tissues and spinal canal: No prevertebral fluid or swelling. No visible canal hematoma. Disc levels: Multilevel degenerative disc disease, worst at C5-C6 and C6-C7.Mild multilevel facet arthropathy, with fusion of the right C2-C3 facet joints. C1-C2 degenerative changes. Upper chest: Negative. Other: None. IMPRESSION: No acute cervical spine fracture. Multilevel degenerative changes as described above. Electronically Signed   By: Maurine Simmering   On: 04/30/2021 13:24    Procedures Procedures   Medications Ordered in ED Medications  aspirin EC tablet 81 mg (has no administration in time range)  ondansetron (ZOFRAN) injection 4 mg (has no administration in time range)  heparin injection 5,000 Units (5,000 Units Subcutaneous Given 04/30/21 1901)  allopurinol (ZYLOPRIM) tablet 300 mg (300 mg Oral Given 04/30/21 1901)  traMADol (ULTRAM) tablet  50 mg (has no administration in time range)  metoprolol tartrate (LOPRESSOR) tablet 12.5 mg (has no administration in time range)  nitroGLYCERIN (NITROSTAT) SL tablet 0.4 mg (has no administration in time range)  rosuvastatin (CRESTOR) tablet 20 mg (20 mg Oral Given 04/30/21 1901)  pantoprazole (PROTONIX) EC tablet 40 mg (40 mg Oral Given 04/30/21 1901)  gabapentin (NEURONTIN) capsule 300 mg (has no administration in time range)  acetaminophen (TYLENOL) tablet 650 mg (has no administration in time range)    ED Course  I have reviewed the triage vital signs and the nursing notes.  Pertinent labs & imaging results that were available during my care of the patient were reviewed by me and considered in my medical decision making (see chart for details).    MDM Rules/Calculators/A&P HEAR Score: 5                         Patient is a 80 year old male who presents for a syncopal episode.  He reports that he has had worsening exertional shortness of breath over the past 2 weeks.  Over the past 2 days, he has had what he describes as extreme fatigue that is worsened with any exertion.  This is not typical for him as he remains a active 80 year old at home.  Today, he experienced a syncopal episode.  At the time, he was seated in a recliner.  He stood up and took 2 steps prior to syncopized and.  He describes a very brief prodrome of dizziness that lasted about 1 second prior to syncopal episode.  When he fell, he fell forward onto carpeted floor.  The fall was unwitnessed.  He did suffer an abrasion to the left side of his forehead.  He was able to get up off of the floor and ambulate.  He actually went to his primary care doctor where he described today's events.  Patient's primary care doctor instructed him to come to the ED.  Patient arrived via ambulance.  Vital signs upon arrival notable for mild bradycardia with heart rate in the range of 50s.  Patient does take metoprolol, although it only a low-dose.   Blood pressure remains normal.  Patient's breathing is even unlabored.  On lung auscultation, patient does not have any evidence of crackles.  He has no focal neurologic deficits.  Other than his minor abrasion to his forehead, he has no external evidence of injuries.  Prior to being bedded in the ED, patient was able to undergo work-up which included CT scans of head and cervical spine.  No evidence of acute  injury identified on the scans.  Patient's EKG showed the following:  ED ECG REPORT   Date: 04/30/2021  Rate: 56  Rhythm: sinus bradycardia  QRS Axis: normal  Intervals: normal  ST/T Wave abnormalities: normal  Conduction Disutrbances:none  Lab work was notable for a BNP of 125.7.  Serial troponins were normal.  Electrolytes were normal.  Given the patient's cardiac history and high risk syncope, patient was admitted for further observation and management.  Final Clinical Impression(s) / ED Diagnoses Final diagnoses:  Syncope and collapse    Rx / DC Orders ED Discharge Orders     None        Godfrey Pick, MD 04/30/21 1918

## 2021-04-30 NOTE — ED Notes (Signed)
Pt not roomed at this time

## 2021-04-30 NOTE — ED Triage Notes (Addendum)
Patient arrived by [facility] from primary MD office following syncopal episode this am at home with abrasion to forehead. Patient complains of chest pressure and SOB with same. Administered 1 SL NTG @ 0930 with chest pressure resolving. Alert and oriented, denies pain. States that the past 2 days the fatigue greatly increased.

## 2021-05-01 ENCOUNTER — Inpatient Hospital Stay (HOSPITAL_COMMUNITY): Payer: Medicare HMO

## 2021-05-01 ENCOUNTER — Inpatient Hospital Stay (HOSPITAL_COMMUNITY)
Admission: EM | Disposition: A | Discharge status: Home / Self Care | Payer: Self-pay | Source: Home / Self Care | Attending: Cardiovascular Disease

## 2021-05-01 ENCOUNTER — Encounter (HOSPITAL_COMMUNITY): Payer: Self-pay | Admitting: Cardiovascular Disease

## 2021-05-01 DIAGNOSIS — I200 Unstable angina: Secondary | ICD-10-CM | POA: Diagnosis not present

## 2021-05-01 DIAGNOSIS — I10 Essential (primary) hypertension: Secondary | ICD-10-CM | POA: Diagnosis not present

## 2021-05-01 DIAGNOSIS — F419 Anxiety disorder, unspecified: Secondary | ICD-10-CM | POA: Diagnosis not present

## 2021-05-01 DIAGNOSIS — E7800 Pure hypercholesterolemia, unspecified: Secondary | ICD-10-CM | POA: Diagnosis not present

## 2021-05-01 DIAGNOSIS — Z955 Presence of coronary angioplasty implant and graft: Secondary | ICD-10-CM | POA: Diagnosis not present

## 2021-05-01 DIAGNOSIS — I2510 Atherosclerotic heart disease of native coronary artery without angina pectoris: Secondary | ICD-10-CM | POA: Diagnosis not present

## 2021-05-01 DIAGNOSIS — S060X9A Concussion with loss of consciousness of unspecified duration, initial encounter: Secondary | ICD-10-CM | POA: Diagnosis not present

## 2021-05-01 DIAGNOSIS — Z8551 Personal history of malignant neoplasm of bladder: Secondary | ICD-10-CM | POA: Diagnosis not present

## 2021-05-01 DIAGNOSIS — R079 Chest pain, unspecified: Secondary | ICD-10-CM

## 2021-05-01 DIAGNOSIS — Z01810 Encounter for preprocedural cardiovascular examination: Secondary | ICD-10-CM | POA: Diagnosis not present

## 2021-05-01 DIAGNOSIS — D696 Thrombocytopenia, unspecified: Secondary | ICD-10-CM | POA: Diagnosis not present

## 2021-05-01 DIAGNOSIS — R001 Bradycardia, unspecified: Secondary | ICD-10-CM | POA: Diagnosis not present

## 2021-05-01 DIAGNOSIS — I25110 Atherosclerotic heart disease of native coronary artery with unstable angina pectoris: Secondary | ICD-10-CM

## 2021-05-01 DIAGNOSIS — Z85828 Personal history of other malignant neoplasm of skin: Secondary | ICD-10-CM | POA: Diagnosis not present

## 2021-05-01 DIAGNOSIS — I088 Other rheumatic multiple valve diseases: Secondary | ICD-10-CM | POA: Diagnosis not present

## 2021-05-01 DIAGNOSIS — H9190 Unspecified hearing loss, unspecified ear: Secondary | ICD-10-CM | POA: Diagnosis not present

## 2021-05-01 DIAGNOSIS — Z20822 Contact with and (suspected) exposure to covid-19: Secondary | ICD-10-CM | POA: Diagnosis not present

## 2021-05-01 DIAGNOSIS — K219 Gastro-esophageal reflux disease without esophagitis: Secondary | ICD-10-CM | POA: Diagnosis not present

## 2021-05-01 DIAGNOSIS — Z8249 Family history of ischemic heart disease and other diseases of the circulatory system: Secondary | ICD-10-CM | POA: Diagnosis not present

## 2021-05-01 DIAGNOSIS — J90 Pleural effusion, not elsewhere classified: Secondary | ICD-10-CM | POA: Diagnosis not present

## 2021-05-01 DIAGNOSIS — Q250 Patent ductus arteriosus: Secondary | ICD-10-CM | POA: Diagnosis not present

## 2021-05-01 DIAGNOSIS — Z79899 Other long term (current) drug therapy: Secondary | ICD-10-CM | POA: Diagnosis not present

## 2021-05-01 DIAGNOSIS — R7303 Prediabetes: Secondary | ICD-10-CM | POA: Diagnosis not present

## 2021-05-01 DIAGNOSIS — Z7982 Long term (current) use of aspirin: Secondary | ICD-10-CM | POA: Diagnosis not present

## 2021-05-01 DIAGNOSIS — I739 Peripheral vascular disease, unspecified: Secondary | ICD-10-CM | POA: Diagnosis not present

## 2021-05-01 DIAGNOSIS — Z8052 Family history of malignant neoplasm of bladder: Secondary | ICD-10-CM | POA: Diagnosis not present

## 2021-05-01 DIAGNOSIS — I214 Non-ST elevation (NSTEMI) myocardial infarction: Secondary | ICD-10-CM | POA: Diagnosis not present

## 2021-05-01 DIAGNOSIS — W19XXXA Unspecified fall, initial encounter: Secondary | ICD-10-CM | POA: Diagnosis not present

## 2021-05-01 DIAGNOSIS — D62 Acute posthemorrhagic anemia: Secondary | ICD-10-CM | POA: Diagnosis not present

## 2021-05-01 DIAGNOSIS — M109 Gout, unspecified: Secondary | ICD-10-CM | POA: Diagnosis not present

## 2021-05-01 DIAGNOSIS — J9811 Atelectasis: Secondary | ICD-10-CM | POA: Diagnosis not present

## 2021-05-01 DIAGNOSIS — I259 Chronic ischemic heart disease, unspecified: Secondary | ICD-10-CM | POA: Diagnosis not present

## 2021-05-01 DIAGNOSIS — R55 Syncope and collapse: Secondary | ICD-10-CM | POA: Diagnosis not present

## 2021-05-01 DIAGNOSIS — Z87891 Personal history of nicotine dependence: Secondary | ICD-10-CM | POA: Diagnosis not present

## 2021-05-01 DIAGNOSIS — E782 Mixed hyperlipidemia: Secondary | ICD-10-CM | POA: Diagnosis not present

## 2021-05-01 HISTORY — PX: TRANSTHORACIC ECHOCARDIOGRAM: SHX275

## 2021-05-01 HISTORY — PX: LEFT HEART CATH AND CORONARY ANGIOGRAPHY: CATH118249

## 2021-05-01 LAB — ECHOCARDIOGRAM COMPLETE
AR max vel: 3.26 cm2
AV Area VTI: 3.09 cm2
AV Area mean vel: 2.93 cm2
AV Mean grad: 7 mmHg
AV Peak grad: 11.4 mmHg
Ao pk vel: 1.69 m/s
Area-P 1/2: 2.48 cm2
Height: 74 in
S' Lateral: 2.9 cm
Weight: 3259.28 oz

## 2021-05-01 LAB — BASIC METABOLIC PANEL
Anion gap: 10 (ref 5–15)
BUN: 18 mg/dL (ref 8–23)
CO2: 27 mmol/L (ref 22–32)
Calcium: 9.1 mg/dL (ref 8.9–10.3)
Chloride: 103 mmol/L (ref 98–111)
Creatinine, Ser: 1.13 mg/dL (ref 0.61–1.24)
GFR, Estimated: >60 mL/min (ref >60)
Glucose, Bld: 82 mg/dL (ref 70–99)
Potassium: 3.9 mmol/L (ref 3.5–5.1)
Sodium: 140 mmol/L (ref 135–145)

## 2021-05-01 LAB — VITAMIN B12: Vitamin B-12: 173 pg/mL — ABNORMAL LOW (ref 180–914)

## 2021-05-01 LAB — HEPARIN LEVEL (UNFRACTIONATED): Heparin Unfractionated: 0.4 IU/mL (ref 0.30–0.70)

## 2021-05-01 LAB — POCT ACTIVATED CLOTTING TIME: Activated Clotting Time: 248 seconds

## 2021-05-01 LAB — TSH: TSH: 2.134 u[IU]/mL (ref 0.350–4.500)

## 2021-05-01 SURGERY — LEFT HEART CATH AND CORONARY ANGIOGRAPHY
Anesthesia: LOCAL

## 2021-05-01 MED ORDER — MIDAZOLAM HCL 2 MG/2ML IJ SOLN
INTRAMUSCULAR | Status: AC
  Filled 2021-05-01: qty 2

## 2021-05-01 MED ORDER — CLOPIDOGREL BISULFATE 300 MG PO TABS
ORAL_TABLET | ORAL | Status: DC | PRN
  Administered 2021-05-01: 600 mg via ORAL

## 2021-05-01 MED ORDER — HEPARIN SODIUM (PORCINE) 1000 UNIT/ML IJ SOLN
INTRAMUSCULAR | Status: AC
  Filled 2021-05-01: qty 1

## 2021-05-01 MED ORDER — NITROGLYCERIN 1 MG/10 ML FOR IR/CATH LAB
INTRA_ARTERIAL | Status: AC
  Filled 2021-05-01: qty 10

## 2021-05-01 MED ORDER — HEPARIN (PORCINE) IN NACL 1000-0.9 UT/500ML-% IV SOLN
INTRAVENOUS | Status: DC | PRN
  Administered 2021-05-01 (×2): 500 mL

## 2021-05-01 MED ORDER — SODIUM CHLORIDE 0.9 % WEIGHT BASED INFUSION
1.0000 mL/kg/h | INTRAVENOUS | Status: DC
  Administered 2021-05-01: 1 mL/kg/h via INTRAVENOUS

## 2021-05-01 MED ORDER — ASPIRIN 81 MG PO CHEW: 81.0000 mg | CHEWABLE_TABLET | ORAL | Status: DC

## 2021-05-01 MED ORDER — LIDOCAINE HCL (PF) 1 % IJ SOLN
INTRAMUSCULAR | Status: DC | PRN
  Administered 2021-05-01: 2 mL

## 2021-05-01 MED ORDER — HEPARIN (PORCINE) IN NACL 1000-0.9 UT/500ML-% IV SOLN
INTRAVENOUS | Status: AC
  Filled 2021-05-01: qty 500

## 2021-05-01 MED ORDER — VERAPAMIL HCL 2.5 MG/ML IV SOLN
INTRAVENOUS | Status: DC | PRN
  Administered 2021-05-01: 10 mL via INTRA_ARTERIAL

## 2021-05-01 MED ORDER — IOHEXOL 350 MG/ML SOLN
INTRAVENOUS | Status: DC | PRN
  Administered 2021-05-01: 100 mL

## 2021-05-01 MED ORDER — LIDOCAINE HCL (PF) 1 % IJ SOLN
INTRAMUSCULAR | Status: AC
  Filled 2021-05-01: qty 30

## 2021-05-01 MED ORDER — HEPARIN SODIUM (PORCINE) 1000 UNIT/ML IJ SOLN
INTRAMUSCULAR | Status: DC | PRN
  Administered 2021-05-01: 3000 [IU] via INTRAVENOUS
  Administered 2021-05-01: 6000 [IU] via INTRAVENOUS
  Administered 2021-05-01: 5000 [IU] via INTRAVENOUS

## 2021-05-01 MED ORDER — SODIUM CHLORIDE 0.9 % WEIGHT BASED INFUSION: 3.0000 mL/kg/h | INTRAVENOUS | Status: DC

## 2021-05-01 MED ORDER — ASPIRIN 81 MG PO CHEW
81.0000 mg | CHEWABLE_TABLET | ORAL | Status: AC
  Administered 2021-05-01: 81 mg via ORAL
  Filled 2021-05-01: qty 1

## 2021-05-01 MED ORDER — FENTANYL CITRATE (PF) 100 MCG/2ML IJ SOLN
INTRAMUSCULAR | Status: DC | PRN
  Administered 2021-05-01: 25 ug

## 2021-05-01 MED ORDER — SODIUM CHLORIDE 0.9 % IV SOLN: INTRAVENOUS | Status: AC

## 2021-05-01 MED ORDER — MIDAZOLAM HCL 2 MG/2ML IJ SOLN
INTRAMUSCULAR | Status: DC | PRN
  Administered 2021-05-01: 1 mg via INTRAVENOUS

## 2021-05-01 MED ORDER — SODIUM CHLORIDE 0.9 % WEIGHT BASED INFUSION: 1.0000 mL/kg/h | INTRAVENOUS | Status: DC

## 2021-05-01 MED ORDER — HYDROCODONE-ACETAMINOPHEN 5-325 MG PO TABS
1.0000 | ORAL_TABLET | Freq: Four times a day (QID) | ORAL | Status: DC | PRN
  Administered 2021-05-01 – 2021-05-05 (×14): 1 via ORAL
  Filled 2021-05-01 (×14): qty 1

## 2021-05-01 MED ORDER — HEPARIN (PORCINE) 25000 UT/250ML-% IV SOLN
1400.0000 [IU]/h | INTRAVENOUS | Status: DC
  Administered 2021-05-01 – 2021-05-05 (×5): 1400 [IU]/h via INTRAVENOUS
  Filled 2021-05-01 (×6): qty 250

## 2021-05-01 MED ORDER — FAMOTIDINE IN NACL 20-0.9 MG/50ML-% IV SOLN
INTRAVENOUS | Status: DC | PRN
  Administered 2021-05-01: 20 mg via INTRAVENOUS

## 2021-05-01 MED ORDER — FAMOTIDINE IN NACL 20-0.9 MG/50ML-% IV SOLN
INTRAVENOUS | Status: AC
  Filled 2021-05-01: qty 50

## 2021-05-01 MED ORDER — SODIUM CHLORIDE 0.9 % WEIGHT BASED INFUSION
3.0000 mL/kg/h | INTRAVENOUS | Status: AC
  Administered 2021-05-01: 3 mL/kg/h via INTRAVENOUS

## 2021-05-01 MED ORDER — FENTANYL CITRATE (PF) 100 MCG/2ML IJ SOLN
INTRAMUSCULAR | Status: AC
  Filled 2021-05-01: qty 2

## 2021-05-01 MED ORDER — VERAPAMIL HCL 2.5 MG/ML IV SOLN
INTRAVENOUS | Status: AC
  Filled 2021-05-01: qty 2

## 2021-05-01 SURGICAL SUPPLY — 19 items
BALLN MINITREK OTW 2.0X12 (BALLOONS) ×2
BALLN SAPPHIRE 2.5X12 (BALLOONS) ×2
BALLOON MINITREK OTW 2.0X12 (BALLOONS) IMPLANT
BALLOON SAPPHIRE 2.5X12 (BALLOONS) IMPLANT
CATH 5FR JL3.5 JR4 ANG PIG MP (CATHETERS) ×1 IMPLANT
CATH VISTA GUIDE 6FR XBLAD3.5 (CATHETERS) ×1 IMPLANT
DEVICE RAD COMP TR BAND LRG (VASCULAR PRODUCTS) ×1 IMPLANT
GLIDESHEATH SLEND SS 6F .021 (SHEATH) ×1 IMPLANT
GUIDEWIRE INQWIRE 1.5J.035X260 (WIRE) IMPLANT
INQWIRE 1.5J .035X260CM (WIRE) ×2
KIT ENCORE 26 ADVANTAGE (KITS) ×1 IMPLANT
KIT HEART LEFT (KITS) ×2 IMPLANT
PACK CARDIAC CATHETERIZATION (CUSTOM PROCEDURE TRAY) ×2 IMPLANT
TRANSDUCER W/STOPCOCK (MISCELLANEOUS) ×2 IMPLANT
TUBING CIL FLEX 10 FLL-RA (TUBING) ×2 IMPLANT
WIRE ASAHI FIELDER XT 190CM (WIRE) IMPLANT
WIRE ASAHI FIELDER XT 300CM (WIRE) ×1 IMPLANT
WIRE COUGAR XT STRL 190CM (WIRE) ×1 IMPLANT
WIRE HI TORQ WHISPER MS 190CM (WIRE) ×1 IMPLANT

## 2021-05-01 NOTE — Progress Notes (Signed)
  Echocardiogram 2D Echocardiogram has been performed.  Brandon Haynes F 05/01/2021, 5:12 PM

## 2021-05-01 NOTE — Plan of Care (Signed)

## 2021-05-01 NOTE — Plan of Care (Signed)

## 2021-05-01 NOTE — Progress Notes (Signed)
[place] for Heparin Indication: chest pain/ACS  Labs: Recent Labs    04/30/21 1236 04/30/21 1308 04/30/21 1311 04/30/21 1930 05/01/21 0227 05/01/21 2240  HGB 13.6  --  13.6 12.0*  --   --   HCT 43.3  --  40.0 37.3*  --   --   PLT 168  --   --  127*  --   --   LABPROT 12.5  --   --   --   --   --   INR 0.9  --   --   --   --   --   HEPARINUNFRC  --   --   --   --   --  0.40  CREATININE 1.11  --  1.10 1.05 1.13  --   TROPONINIHS 5 5  --   --   --   --    Assessment: Pt with a hx of CAD who was admitted for ongoing fatigue and short of breath. He is s/p cath that showed multivessel dz with plan for CABG consult. Heparin to be started 8 hr post sheath removal at around 1700.  Heparin level 0.4 unit/ml  Goal of Therapy:  Heparin level 0.3-0.7 units/ml Monitor platelets by anticoagulation protocol: Yes   Plan:  Continue heparin at 1400 units/hr Daily HL and CBC  Thanks for allowing pharmacy to be a part of this patient's care.  Excell Seltzer, PharmD Clinical Pharmacist  05/01/2021 11:33 PM

## 2021-05-01 NOTE — Consult Note (Addendum)
[address]       [place] [address]             [place] Record [id number] Date of Birth: 1941-04-13  Referring: No ref. provider found Primary Care: McGowen, [name], MD Primary Cardiologist:Traci Radford Pax, MD  Chief Complaint:   No chief complaint on file.   History of Present Illness:    We are asked to see this 80 year old male in cardiothoracic surgical consultation due to severe coronary artery disease.  The patient was hospitalized in March 2016 for chest pain and syncope concerning for unstable angina.  At that time he underwent cardiac catheterization and this revealed a severe LAD 99% stenosis which was treated with DES.  He also had an occluded RCA with left-to-right collaterals.  He was placed at that time on aspirin, Brilinta, Crestor and metoprolol for medical therapy.  Echocardiogram at that time showed an ejection fraction of 50 to 55% with akinesis of the basal-mid inferoseptal myocardium, akinesis of the apical lateral septal myocardium and grade 1 diastolic dysfunction.  There now he was last seen by Dr. Radford Pax on 03/11/2021 1and was doing well from a clinical perspective without anginal symptoms.  On 04/30/2021 he was seen by his pcp with complaints of 2 weeks onset of progressive worsening fatigue and shortness of breath with exertion.  He is normally very active mowing his grass and working around [employer] grounds as well as working in his garden.  A few hours before the visit he experienced some mid sternal chest tightness as well as dizziness.  It is also associated with some nausea and he subsequently passed out and hit his left forehead on a carpeted floor.  This was an unwitnessed fall.  EMS was called called during MD visit and he was taken to the emergency department.  Initial high-sensitivity troponins were negative x2 and BNP was 125.  CT scan of the head showed no acute bleed..  EKG showed no acute  ST's T wave segment changes.  He was found to have some bradycardia with heart rate in the 50-60 range.  Cardiology was asked to see the patient in consultation and he was admitted to the telemetry unit with plans for cardiac catheterization.  This was done on today's date.  Findings are listed below and inclusive of a heavily calcified mid LAD stenosis, patent mid LAD stent with minimal stenosis.  This lesion had a unsuccessfull attempted PCI as the wire was unable to cross the lesion.  He is noted to have continued preserved LV systolic function.  We are asked to see for consideration of coronary artery surgical revascularization.     Current Activity/ Functional Status: Patient is independent with mobility/ambulation, transfers, ADL's, IADL's.   Zubrod Score: At the time of surgery this patient's most appropriate activity status/level should be described as: '[]'$     0    Normal activity, no symptoms '[x]'$     1    Restricted in physical strenuous activity but ambulatory, able to do out light work '[]'$     2    Ambulatory and capable of self care, unable to do work activities, up and about                 more than 50%  Of the time                            '[]'$   3    Only limited self care, in bed greater than 50% of waking hours '[]'$     4    Completely disabled, no self care, confined to bed or chair '[]'$     5    Moribund  Past Medical History:  Diagnosis Date   Allergic rhinitis    allergy shots via [place]   Anxiety    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  Lesion resected. No mets.  He got neoadjuvant chemo x 4 cycles.  Cystoprostectomy with ileal conduit formation 12/13/19 (Dr. Tresa Moore). 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 difficile colitis 01/09/2020; late May 2021   oral vancomycin   CAD, multiple vessel    a. CP/near-syncope 11/2014 s/p DES to MLAD, occluded RCA after  takeoff of RV marginal with collaterals treated medically, otherwise mild nonobstructive disease. EF 55%;  b. 07/2015 Lexiscan CL: EF 56%, no ischemia/infarct.    Cataracts, both eyes    Chronic constipation    worse since ileal conduit surgery was done 2020   Chronic low back pain    DDD (degenerative disc disease), lumbar    GERD (gastroesophageal reflux disease)    PPI bid needed to control sx's   Gout    Hearing loss    History  of basal cell carcinoma    History of adenomatous polyp of colon    Rpt colonoscopy recommended 02/2020->no further if no high risk lesions at that time.   History of prostate cancer 11/2019   grd 2 cancer with neg margins incidental on cystectomy path 11/2019.   Hypercholesteremia    Hypertension    Hx labile/uncontrolled.  Improved s/p visit to HTN clinic 2019.   Iron deficiency anemia 2017   Colonoscopy, EGD, and capsule endoscopy w/out obvious cause/source found.  Oral iron helping as of 03/2016.   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 No avid enhancement or mass effect. Obs by urol as of 10/2019 and 11/2020.    Past Surgical History:  Procedure Laterality Date   back injection  oct 2011, 07/2010, 12/2012   no surgery   Capsule endoscopy  02/2016   mild duodenitis, o/w NEG   CARDIOVASCULAR STRESS TEST  05/2009; 07/2015   carotid dopplers  11/2014   Bilateral - 1% to 9% ICA stenosis. Vertebral artery flow is   CATARACT EXTRACTION, BILATERAL     Jan, Feb 2022   COLONOSCOPY  08/2008; 02/2016; 02/2017; 05/27/20   2017: 12 polyps (adenomatous).  02/2017 repeat TCS--multiple polyps. 04/2020 +adenomas.   CORONARY ANGIOPLASTY WITH STENT PLACEMENT  12/04/2014   4.0 x 12 mm Synergy stent to the mid LAD   CYSTOSCOPY  05/24/2019   CYSTOSCOPY W/  TRANSURETHRAL RESECTION OF POSTERIOR URETHERAL VALVES  06/12/2019   2-5cm   CYSTOSCOPY WITH INJECTION N/A 12/13/2019   Procedure: CYSTOSCOPY WITH INJECTION;  Surgeon: [name], MD;  Location: [facility];  Service: Urology;  Laterality: N/A;   ESOPHAGOGASTRODUODENOSCOPY  02/2016   Gastritis.  NEG h pylori.     IR IMAGING GUIDED PORT INSERTION  09/12/2019   IR REMOVAL TUN ACCESS W/ PORT W/O FL MOD SED  08/20/2020   LE  arterial vascular study  02/2016   Per Dr. Radford Pax, no evidence of peripheral vascular disease   LEFT HEART CATHETERIZATION WITH CORONARY ANGIOGRAM N/A 12/04/2014   Procedure: LEFT HEART CATHETERIZATION WITH CORONARY ANGIOGRAM;  Surgeon: Jolaine Artist, MD; LAD 99% then aneurysmal, OM1 40%, CFX 50%, RCA 40%, 50% then occluded   LYMPHADENECTOMY Bilateral 12/13/2019   Procedure: LYMPHADENECTOMY;  Surgeon: [name], MD;  Location: [facility];  Service: Urology;  Laterality: Bilateral;   ROBOT ASSISTED LAPAROSCOPIC COMPLETE CYSTECT ILEAL CONDUIT N/A 12/13/2019   Procedure: XI ROBOTIC ASSISTED LAPAROSCOPIC COMPLETE CYSTECT ILEAL CONDUIT AND RADICAL PROSTATECTOMY;  Surgeon: [name], MD;  Location: [facility];  Service: Urology;  Laterality: N/A;  6 HRS   ROTATOR CUFF REPAIR Left 09/2013   left   SHOULDER INJECTION  feb 2014   TRANSTHORACIC ECHOCARDIOGRAM  11/2014   EF 50-55%, some wall motion abnormalities noted, grade I diast dysfxn   TRANSURETHRAL RESECTION OF BLADDER TUMOR N/A 06/12/2019   INVASIVE HIGH GRADE PAPILLARY UROTHELIAL CARCINOMA, invading muscularis propria.  Procedure: TRANSURETHRAL RESECTION OF BLADDER TUMOR (TURBT);  Surgeon: Lucas Mallow, MD;  Location: [facility];  Service: Urology;  Laterality: N/A;    Social History   Tobacco Use  Smoking Status Former   Packs/day: 2.00   Years: 35.00   Pack years: 70.00   Types: Cigarettes   Quit date: 09/28/1998   Years since quitting: 22.6  Smokeless Tobacco Former   Types: Snuff   Quit date: 02/09/1986  Tobacco  Comments   as a teenager    Social History   Substance and Sexual Activity  Alcohol Use No   Alcohol/week: 0.0 standard drinks   Comment: quit 1994     No Known Allergies  Current Facility-Administered Medications  Medication Dose Route Frequency Provider Last Rate Last Admin   0.9 %  sodium chloride infusion   Intravenous Continuous Burnell Blanks, MD       acetaminophen (TYLENOL) tablet 650 mg  650 mg Oral Q6H PRN Margie Billet, NP       allopurinol (ZYLOPRIM) tablet 300 mg  300 mg Oral Daily Lauree Chandler D, MD   300 mg at 04/30/21 1901   aspirin EC tablet 81 mg  81 mg Oral Daily Margie Billet, NP       gabapentin (NEURONTIN) capsule 300 mg  300 mg Oral BID Lauree Chandler D, MD   300 mg at 04/30/21 2111   heparin injection 5,000 Units  5,000 Units Subcutaneous Q8H Margie Billet, NP   5,000 Units at 05/01/21 0534   metoprolol tartrate (LOPRESSOR) tablet 12.5 mg  12.5 mg Oral BID Lauree Chandler D, MD   12.5 mg at 04/30/21 2111   nitroGLYCERIN (NITROSTAT) SL tablet 0.4 mg  0.4 mg Sublingual Q5 min PRN Burnell Blanks, MD       ondansetron [facility]) injection 4 mg  4 mg Intravenous Q6H PRN Margie Billet, NP       pantoprazole (PROTONIX) EC tablet 40 mg  40 mg Oral Daily Burnell Blanks, MD   40 mg at 04/30/21 1901   rosuvastatin (CRESTOR) tablet 20 mg  20 mg Oral Daily Burnell Blanks, MD   20 mg at 04/30/21 1901   traMADol (ULTRAM) tablet 50 mg  50 mg Oral Q8H PRN Burnell Blanks, MD   50 mg at 04/30/21 2111    Medications Prior to Admission  Medication Sig Dispense Refill Last Dose   acetaminophen (TYLENOL) 650 MG CR tablet Take 650  mg by mouth every 8 (eight) hours as needed for pain.   04/29/2021   allopurinol (ZYLOPRIM) 300 MG tablet TAKE 1 TABLET BY MOUTH EVERY DAY (Patient taking differently: Take 300 mg by mouth daily.) 90 tablet 3 04/29/2021   aspirin EC 81 MG tablet Take 81 mg by mouth daily.   04/30/2021   Cholecalciferol (D3  PO) Take 1 capsule by mouth daily.   04/30/2021   desonide (DESOWEN) 0.05 % cream Apply 1 application topically daily as needed (dry skin).  1 Past Week   EPINEPHrine 0.3 mg/0.3 mL IJ SOAJ injection Inject 0.3 mg into the muscle as needed for anaphylaxis.   never   gabapentin (NEURONTIN) 300 MG capsule Take 300 mg by mouth 2 (two) times daily.   04/30/2021   hydrocortisone cream 1 % Apply 1 application topically 4 (four) times daily as needed for itching.   Past Week   loratadine (CLARITIN) 10 MG tablet Take 20 mg by mouth daily.   04/30/2021   metoprolol tartrate (LOPRESSOR) 25 MG tablet Take 0.5 tablets (12.5 mg total) by mouth 2 (two) times daily. 180 tablet 3 04/30/2021 at 0700   nitroGLYCERIN (NITROSTAT) 0.4 MG SL tablet PLACE 1 TABLET UNDER TONGUE EVERY 5 MINS, UP TO 3 DOSES AS NEEDED FOR CHEST PAIN (Patient taking differently: Place 0.4 mg under the tongue every 5 (five) minutes as needed for chest pain.) 25 tablet 1 04/30/2021   pantoprazole (PROTONIX) 40 MG tablet TAKE 1 TABLET BY MOUTH EVERY DAY (Patient taking differently: Take 40 mg by mouth daily.) 90 tablet 1 04/30/2021   PREVIDENT 5000 BOOSTER PLUS 1.1 % PSTE Take 1 application by mouth as directed.  1 Past Week   rosuvastatin (CRESTOR) 20 MG tablet Take 1 tablet (20 mg total) by mouth daily. 90 tablet 3 04/29/2021   traMADol (ULTRAM) 50 MG tablet TAKE 1 TABLET BY MOUTH 3 TIMES A DAY AS NEEDED FOR PAIN (Patient taking differently: Take 50-100 mg by mouth 3 (three) times daily as needed for moderate pain.) 90 tablet 5 04/29/2021   triamcinolone (NASACORT ALLERGY 24HR) 55 MCG/ACT AERO nasal inhaler Place 1 spray into the nose 2 (two) times daily as needed (allergies).   Past Week    Family History  Problem Relation Age of Onset   Lung cancer Father    Heart disease Father    Breast cancer Mother    Congestive Heart Failure Mother    Diabetes Mother    Breast cancer Sister    Heart disease Brother    COPD Brother    Bladder Cancer Maternal  Grandfather    Colon cancer Neg Hx    Esophageal cancer Neg Hx    Pancreatic cancer Neg Hx    Rectal cancer Neg Hx    Stomach cancer Neg Hx    Prostate cancer Neg Hx    Colon polyps Neg Hx      Review of Systems:   Review of Systems  Constitutional:  Positive for diaphoresis and malaise/fatigue. Negative for chills, fever and weight loss.  HENT:  Positive for congestion, hearing loss, sinus pain and tinnitus. Negative for ear discharge, ear pain, nosebleeds and sore throat.        Bilat hearing aids  Eyes:  Positive for blurred vision and pain. Negative for double vision, photophobia, discharge and redness.  Respiratory:  Positive for shortness of breath. Negative for cough, hemoptysis, sputum production, wheezing and stridor.   Cardiovascular:  Positive for chest pain. Negative for palpitations, orthopnea,  claudication, leg swelling and PND.  Gastrointestinal:  Positive for constipation and heartburn. Negative for abdominal pain, blood in stool, diarrhea, melena, nausea and vomiting.       + dysphagia at times  Genitourinary:        S/p TURBT/ cystectomy w/ ileal conduit  Musculoskeletal:  Positive for back pain and falls.       Limbar back pain x 20 years tx's by neutologist  Skin:  Negative for itching and rash.  Neurological:  Positive for dizziness, tingling, sensory change, focal weakness and weakness. Negative for tremors, speech change, seizures, loss of consciousness and headaches.  Endo/Heme/Allergies:  Positive for environmental allergies.  Psychiatric/Behavioral:  Positive for depression. Negative for hallucinations, memory loss, substance abuse and suicidal ideas. The patient has insomnia. The patient is not nervous/anxious.           Physical Exam: BP 131/78   Pulse (!) 58   Temp 98.2 F (36.8 C) (Oral)   Resp 10   Ht '6\' 2"'$  (1.88 m)   Wt 96.2 kg   SpO2 99%   BMI 27.22 kg/m    Physical Exam  Constitutional: No distress.  HENT:  Nose: No nasal discharge.   Mouth/Throat: Dental caries present. Oropharynx is clear. Pharynx is normal.  Eyes: Pupils are equal, round, and reactive to light. Conjunctivae are normal.  Neck: Thyroid normal. No JVD present. No neck adenopathy. No thyromegaly present.  Cardiovascular: Regular rhythm, S1 normal, S2 normal and normal heart sounds. Exam reveals no gallop.  No murmur heard. Pulses:      Dorsalis pedis pulses are 2+ on the right side and 2+ on the left side.       Posterior tibial pulses are 0 on the right side and 2+ on the left side.  No carotid bruits  Pulmonary/Chest: Breath sounds normal. He has no wheezes. He has no rales. He exhibits no tenderness.  Abdominal: Soft. Bowel sounds are normal. He exhibits no distension and no mass. There is no hepatomegaly. There is no abdominal tenderness.  Musculoskeletal:        General: No tenderness, deformity or edema.     Cervical back: Normal range of motion and neck supple.  Neurological: He is alert and oriented to person, place, and time. He has normal motor skills.  Skin: Skin is warm and dry. No rash noted. No cyanosis. No jaundice or pallor. Nails show no clubbing.    Diagnostic Studies & Laboratory data:     Recent Radiology Findings:   DG Chest 2 View  Result Date: 04/30/2021 CLINICAL DATA:  Chest pain EXAM: CHEST - 2 VIEW COMPARISON:  Radiograph 11/18/2020, chest CT 02/19/2020 FINDINGS: Unchanged cardiomediastinal silhouette. No focal airspace disease. No pleural effusion or visible pneumothorax. No acute osseous abnormality. Thoracic spondylosis. IMPRESSION: No evidence of acute cardiopulmonary disease. Electronically Signed   By: Maurine Simmering   On: 04/30/2021 13:17   CT HEAD WO CONTRAST (5MM)  Result Date: 04/30/2021 CLINICAL DATA:  Head trauma, minor (Age >= 65y) EXAM: CT HEAD WITHOUT CONTRAST TECHNIQUE: Contiguous axial images were obtained from the base of the skull through the vertex without intravenous contrast. COMPARISON:  MRI 04/22/2006  FINDINGS: Brain: No evidence of acute infarction, hemorrhage, hydrocephalus, extra-axial collection or mass lesion/mass effect.The ventricles are normal in size.Scattered subcortical and periventricular white matter hypodensities, nonspecific but likely sequela of chronic small vessel ischemic disease. Vascular: No hyperdense vessel or unexpected calcification. Skull: Normal. Negative for fracture or focal lesion. Sinuses/Orbits: Scattered paranasal sinus  mucosal thickening. Other: None. IMPRESSION: No acute intracranial abnormality. Electronically Signed   By: Maurine Simmering   On: 04/30/2021 13:21   CT Cervical Spine Wo Contrast  Result Date: 04/30/2021 CLINICAL DATA:  Neck trauma (Age >= 65y) EXAM: CT CERVICAL SPINE WITHOUT CONTRAST TECHNIQUE: Multidetector CT imaging of the cervical spine was performed without intravenous contrast. Multiplanar CT image reconstructions were also generated. COMPARISON:  None. FINDINGS: Alignment: Straightening of the normal cervical lordosis. Skull base and vertebrae: But there is no acute fracture. There is no aggressive osseous lesion. Soft tissues and spinal canal: No prevertebral fluid or swelling. No visible canal hematoma. Disc levels: Multilevel degenerative disc disease, worst at C5-C6 and C6-C7.Mild multilevel facet arthropathy, with fusion of the right C2-C3 facet joints. C1-C2 degenerative changes. Upper chest: Negative. Other: None. IMPRESSION: No acute cervical spine fracture. Multilevel degenerative changes as described above. Electronically Signed   By: Maurine Simmering   On: 04/30/2021 13:24   CARDIAC CATHETERIZATION  Result Date: 05/01/2021 Formatting of this result is different from the original.   Prox RCA-1 lesion is 50% stenosed.   Prox RCA-2 lesion is 50% stenosed.   Mid RCA lesion is 100% stenosed.   Prox Cx lesion is 30% stenosed.   Mid Cx to Dist Cx lesion is 50% stenosed.   Mid LAD-2 lesion is 20% stenosed.   Mid LAD-1 lesion is 99% stenosed.   Dist LAD  lesion is 30% stenosed.   The left ventricular systolic function is normal.   LV end diastolic pressure is normal.   The left ventricular ejection fraction is 55-65% by visual estimate.   There is no mitral valve regurgitation. Severe heavily calcified mid LAD stenosis. Patent mid LAD stent with minimal restenosis. Mild to moderate Circumflex stenosis Chronic occlusion mid RCA. The distal branches fill briskly from left to right collaterals Preserved LV systolic function Unsuccessful attempt at PCI of the mid LAD lesion as I was unable to cross the lesion with a wire. Recommendations: Will consult CT surgery for CABG as PCI was not successful despite a long attempt at crossing the lesion. He was loaded with Plavix 600 mg this am in anticipation of PCI. He will need Plavix washout before surgery.     I have independently reviewed the above radiologic studies and discussed with the patient   Recent Lab Findings: Lab Results  Component Value Date   WBC 5.6 04/30/2021   HGB 12.0 (L) 04/30/2021   HCT 37.3 (L) 04/30/2021   PLT 127 (L) 04/30/2021   GLUCOSE 82 05/01/2021   CHOL 130 01/28/2021   TRIG 135 01/28/2021   HDL 60 01/28/2021   LDLCALC 48 01/28/2021   ALT 16 04/30/2021   AST 21 04/30/2021   NA 140 05/01/2021   K 3.9 05/01/2021   CL 103 05/01/2021   CREATININE 1.13 05/01/2021   BUN 18 05/01/2021   CO2 27 05/01/2021   TSH 2.134 05/01/2021   INR 0.9 04/30/2021   HGBA1C 5.2 04/05/2019    LEFT HEART CATH AND CORONARY ANGIOGRAPHY    Conclusion      Prox RCA-1 lesion is 50% stenosed.   Prox RCA-2 lesion is 50% stenosed.   Mid RCA lesion is 100% stenosed.   Prox Cx lesion is 30% stenosed.   Mid Cx to Dist Cx lesion is 50% stenosed.   Mid LAD-2 lesion is 20% stenosed.   Mid LAD-1 lesion is 99% stenosed.   Dist LAD lesion is 30% stenosed.   The left ventricular  systolic function is normal.   LV end diastolic pressure is normal.   The left ventricular ejection fraction is 55-65% by  visual estimate.   There is no mitral valve regurgitation.   Severe heavily calcified mid LAD stenosis. Patent mid LAD stent with minimal restenosis. Mild to moderate Circumflex stenosis Chronic occlusion mid RCA. The distal branches fill briskly from left to right collaterals Preserved LV systolic function Unsuccessful attempt at PCI of the mid LAD lesion as I was unable to cross the lesion with a wire.   Recommendations: Will consult CT surgery for CABG as PCI was not successful despite a long attempt at crossing the lesion. He was loaded with Plavix 600 mg this am in anticipation of PCI. He will need Plavix washout before surgery.    Indications  Unstable angina (HCC) [I20.0 (ICD-10-CM)]   Procedural Details  Technical Details Indication:80 yo male with CAD with prior stenting of the mid LAD in 2016, known CTO of the mid RCA admitted with unstable angina.   Procedure: The risks, benefits, complications, treatment options, and expected outcomes were discussed with the patient. The patient and/or family concurred with the proposed plan, giving informed consent. The patient was brought to the cath lab after IV hydration was given. The patient was sedated with Versed and Fentanyl. The right wrist was prepped and draped in a sterile fashion. 1% lidocaine was used for local anesthesia. Using the modified Seldinger access technique, a 5 French sheath was placed in the right radial artery. 3 mg Verapamil was given through the sheath. 5000 units IV heparin was given. Standard diagnostic catheters were used to perform selective coronary angiography. LV gram performed with the JR4 catheter.   He as found to have a severe, calcified stenosis of the mid LAD. I planned to attempt PCI of the mid LAD which would likely have required orbital atherectomy.   PCI Note: I engaged the left main with a XB LAD 3.5 guiding catheter. Additional IV heparin was given. He was loaded with Plavix 600 mg x 1. I then made  multiple attempts with different wires to cross the severe, calcific mid LAD stenosis but was unsuccessful. The PCI was aborted.   The sheath was removed from the right radial artery and a Terumo hemostasis band was applied at the arteriotomy site on the right wrist.     Estimated blood loss <50 mL.   During this procedure medications were administered to achieve and maintain moderate conscious sedation while the patient's heart rate, blood pressure, and oxygen saturation were continuously monitored and I was present face-to-face 100% of this time.   Medications (Filter: Administrations occurring from 0725 to 0849 on 05/01/21)  midazolam (VERSED) injection (mg) Total dose:  1 mg  Date/Time Rate/Dose/Volume Action   05/01/21 0747 1 mg Given    fentaNYL (SUBLIMAZE) injection (mcg) Total dose:  25 mcg  Date/Time Rate/Dose/Volume Action   05/01/21 Canceled Entry   0755 25 mcg Given    Heparin (Porcine) in NaCl 1000-0.9 UT/500ML-% SOLN (mL) Total volume:  1,000 mL  Date/Time Rate/Dose/Volume Action   05/01/21 0755 500 mL Given   0756 500 mL Given    lidocaine (PF) (XYLOCAINE) 1 % injection (mL) Total volume:  2 mL  Date/Time Rate/Dose/Volume Action   05/01/21 0804 2 mL Given    Radial Cocktail/Verapamil only (mL) Total volume:  10 mL  Date/Time Rate/Dose/Volume Action   05/01/21 0805 10 mL Given    heparin sodium (porcine) injection (Units) Total dose:  14,000 Units  Date/Time Rate/Dose/Volume Action   05/01/21 0806 5,000 Units Given   0814 6,000 Units Given   0827 3,000 Units Given    clopidogrel (PLAVIX) tablet (mg) Total dose:  600 mg  Date/Time Rate/Dose/Volume Action   05/01/21 0816 600 mg Given    famotidine (PEPCID) IVPB 20 mg premix (mg) Total dose:  20 mg  Date/Time Rate/Dose/Volume Action   05/01/21 0820 20 mg - 100 mL/hr New Bag/Given   0844  (over 30 min) Stopped    iohexol (OMNIPAQUE) 350 MG/ML injection (mL) Total volume:  100 mL  Date/Time  Rate/Dose/Volume Action   05/01/21 0840 100 mL Given    acetaminophen (TYLENOL) tablet 650 mg (mg) Total dose:  Cannot be calculated*  *Administration dose not documented Date/Time Rate/Dose/Volume Action   05/01/21 0725 *Not included in total MAR Hold    allopurinol (ZYLOPRIM) tablet 300 mg (mg) Total dose:  Cannot be calculated*  *Administration dose not documented Date/Time Rate/Dose/Volume Action   05/01/21 0725 *Not included in total MAR Hold    aspirin EC tablet 81 mg (mg) Total dose:  Cannot be calculated*  *Administration dose not documented Date/Time Rate/Dose/Volume Action   05/01/21 0725 *Not included in total MAR Hold    gabapentin (NEURONTIN) capsule 300 mg (mg) Total dose:  Cannot be calculated*  *Administration dose not documented Date/Time Rate/Dose/Volume Action   05/01/21 0725 *Not included in total MAR Hold    heparin injection 5,000 Units (Units) Total dose:  Cannot be calculated*  *Administration dose not documented Date/Time Rate/Dose/Volume Action   05/01/21 0725 *Not included in total MAR Hold    metoprolol tartrate (LOPRESSOR) tablet 12.5 mg (mg) Total dose:  Cannot be calculated*  *Administration dose not documented Date/Time Rate/Dose/Volume Action   05/01/21 0725 *Not included in total MAR Hold    nitroGLYCERIN (NITROSTAT) SL tablet 0.4 mg (mg) Total dose:  Cannot be calculated*  *Administration dose not documented Date/Time Rate/Dose/Volume Action   05/01/21 0725 *Not included in total MAR Hold    ondansetron (ZOFRAN) injection 4 mg (mg) Total dose:  Cannot be calculated*  *Administration dose not documented Date/Time Rate/Dose/Volume Action   05/01/21 0725 *Not included in total MAR Hold    pantoprazole (PROTONIX) EC tablet 40 mg (mg) Total dose:  Cannot be calculated*  *Administration dose not documented Date/Time Rate/Dose/Volume Action   05/01/21 0725 *Not included in total MAR Hold    rosuvastatin (CRESTOR) tablet 20  mg (mg) Total dose:  Cannot be calculated*  *Administration dose not documented Date/Time Rate/Dose/Volume Action   05/01/21 0725 *Not included in total MAR Hold    traMADol (ULTRAM) tablet 50 mg (mg) Total dose:  Cannot be calculated*  *Administration dose not documented Date/Time Rate/Dose/Volume Action   05/01/21 0725 *Not included in total MAR Hold     Sedation Time  Sedation Time Physician-1: 52 minutes 27 seconds   Contrast  Medication Name Total Dose  iohexol (OMNIPAQUE) 350 MG/ML injection 100 mL    Radiation/Fluoro  Fluoro time: 11.6 (min) DAP: 17.4 (Gycm2) Cumulative Air Kerma: 357.8 (mGy)   Coronary Findings   Diagnostic Dominance: Right  Left Anterior Descending  Vessel is large.  Mid LAD-1 lesion is 99% stenosed. The lesion is calcified.  Mid LAD-2 lesion is 20% stenosed. The lesion was previously treated using a drug eluting stent over 2 years ago.  Dist LAD lesion is 30% stenosed.  Left Circumflex  Vessel is large.  Prox Cx lesion is 30% stenosed.  Mid Cx to [facility]  Cx lesion is 50% stenosed.  Second Obtuse Marginal Branch  Vessel is large in size.  Right Coronary Artery  Vessel is large.  Prox RCA-1 lesion is 50% stenosed.  Prox RCA-2 lesion is 50% stenosed.  Mid RCA lesion is 100% stenosed. The lesion is chronically occluded.   Intervention   No interventions have been documented.  Left Heart  Left Ventricle The left ventricular size is normal. The left ventricular systolic function is normal. LV end diastolic pressure is normal. The left ventricular ejection fraction is 55-65% by visual estimate. No regional wall motion abnormalities. There is no evidence of mitral regurgitation.   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 difficile colitis History of rotator cuff tear     Plan: The patient and all pertinent studies will be reviewed by the surgeon to determine if he is a adequate candidate to proceed with CABG.    I  spent 55 minutes counseling the patient face to face.   Chioke Giovanni, PA-C  05/01/2021 9:55 AM  Agree with above.  80 year old male with two-vessel coronary artery disease and preserved LV function.  Discussed the risks and benefits of surgical revascularization and he is agreeable to proceed with an off-pump CABG x2.  He is tentatively scheduled for 05/06/2021.  Shanese Riemenschneider Bary Leriche

## 2021-05-01 NOTE — Interval H&P Note (Signed)
History and Physical Interval Note:  05/01/2021 7:37 AM  Brandon Haynes  has presented today for surgery, with the diagnosis of angina.  The various methods of treatment have been discussed with the patient and family. After consideration of risks, benefits and other options for treatment, the patient has consented to  Procedure(s): LEFT HEART CATH AND CORONARY ANGIOGRAPHY (N/A) as a surgical intervention.  The patient's history has been reviewed, patient examined, no change in status, stable for surgery.  I have reviewed the patient's chart and labs.  Questions were answered to the patient's satisfaction.    Cath Lab Visit (complete for each Cath Lab visit)  Clinical Evaluation Leading to the Procedure:   ACS: No.  Non-ACS:    Anginal Classification: CCS III  Anti-ischemic medical therapy: Minimal Therapy (1 class of medications)  Non-Invasive Test Results: No non-invasive testing performed  Prior CABG: No previous CABG        Lauree Chandler

## 2021-05-01 NOTE — ED Notes (Signed)
Pt appears to be asleep. NAD noted. 

## 2021-05-01 NOTE — Progress Notes (Signed)
Progress Note  Patient Name: Brandon Haynes Date of Encounter: 05/01/2021  [facility] Cardiologist: Fransico Him, MD   Subjective   No recurrent symptoms this morning.  Patient had left heart cath by Dr. Angelena Form revealing an occluded RCA which was known and is subtotally occluded highly calcified mid LAD proximal to previously placed stent which he was unable to cross.  He was referred for CAB G.   Inpatient Medications    Scheduled Meds:  [MAR Hold] allopurinol  300 mg Oral Daily   [MAR Hold] aspirin EC  81 mg Oral Daily   [MAR Hold] gabapentin  300 mg Oral BID   [MAR Hold] heparin  5,000 Units Subcutaneous Q8H   [MAR Hold] metoprolol tartrate  12.5 mg Oral BID   [MAR Hold] pantoprazole  40 mg Oral Daily   [MAR Hold] rosuvastatin  20 mg Oral Daily   Continuous Infusions:  sodium chloride     sodium chloride 1 mL/kg/hr (05/01/21 0634)   PRN Meds: [MAR Hold] acetaminophen, [MAR Hold] nitroGLYCERIN, [MAR Hold] ondansetron (ZOFRAN) IV, [MAR Hold] traMADol   Vital Signs    Vitals:   05/01/21 0855 05/01/21 0900 05/01/21 0910 05/01/21 0925  BP: 137/76 139/79 131/81 131/78  Pulse: 61 62 (!) 58 (!) 58  Resp: (!) 8 (!) '7 12 10  '$ Temp:      TempSrc:      SpO2: 99% 97% 98% 99%  Weight:      Height:        Intake/Output Summary (Last 24 hours) at 05/01/2021 0947 Last data filed at 05/01/2021 0900 Gross per 24 hour  Intake --  Output 1000 ml  Net -1000 ml   Last 3 Weights 05/01/2021 03/11/2021 02/05/2021  Weight (lbs) 212 lb 212 lb 213 lb  Weight (kg) 96.163 kg 96.163 kg 96.616 kg      Telemetry    Sinus rhythm/sinus bradycardia- Personally Reviewed  ECG    Normal sinus rhythm at 67 without ST or T wave changes- Personally Reviewed  Physical Exam   GEN: No acute distress.   Neck: No JVD Cardiac: RRR, no murmurs, rubs, or gallops.  Respiratory: Clear to auscultation bilaterally. GI: Soft, nontender, non-distended  MS: No edema; No deformity. Neuro:  Nonfocal   Psych: Normal affect   Labs    High Sensitivity Troponin:   Recent Labs  Lab 04/30/21 1236 04/30/21 1308  TROPONINIHS 5 5      Chemistry Recent Labs  Lab 04/30/21 1236 04/30/21 1311 04/30/21 1930 05/01/21 0227  NA 140 141  --  140  K 4.3 4.5  --  3.9  CL 107 109  --  103  CO2 24  --   --  27  GLUCOSE 97 91  --  82  BUN 20 24*  --  18  CREATININE 1.11 1.10 1.05 1.13  CALCIUM 9.3  --   --  9.1  PROT 6.7  --   --   --   ALBUMIN 4.0  --   --   --   AST 21  --   --   --   ALT 16  --   --   --   ALKPHOS 40  --   --   --   BILITOT 1.0  --   --   --   GFRNONAA >60  --  >60 >60  ANIONGAP 9  --   --  10     Hematology Recent Labs  Lab 04/30/21 1236  04/30/21 1311 04/30/21 1930  WBC 8.7  --  5.6  RBC 4.60  --  3.95*  HGB 13.6 13.6 12.0*  HCT 43.3 40.0 37.3*  MCV 94.1  --  94.4  MCH 29.6  --  30.4  MCHC 31.4  --  32.2  RDW 15.3  --  15.4  PLT 168  --  127*    BNP Recent Labs  Lab 04/30/21 1256  BNP 125.7*     DDimer No results for input(s): DDIMER in the last 168 hours.   Radiology    DG Chest 2 View  Result Date: 04/30/2021 CLINICAL DATA:  Chest pain EXAM: CHEST - 2 VIEW COMPARISON:  Radiograph 11/18/2020, chest CT 02/19/2020 FINDINGS: Unchanged cardiomediastinal silhouette. No focal airspace disease. No pleural effusion or visible pneumothorax. No acute osseous abnormality. Thoracic spondylosis. IMPRESSION: No evidence of acute cardiopulmonary disease. Electronically Signed   By: Maurine Simmering   On: 04/30/2021 13:17   CT HEAD WO CONTRAST (5MM)  Result Date: 04/30/2021 CLINICAL DATA:  Head trauma, minor (Age >= 65y) EXAM: CT HEAD WITHOUT CONTRAST TECHNIQUE: Contiguous axial images were obtained from the base of the skull through the vertex without intravenous contrast. COMPARISON:  MRI 04/22/2006 FINDINGS: Brain: No evidence of acute infarction, hemorrhage, hydrocephalus, extra-axial collection or mass lesion/mass effect.The ventricles are normal in  size.Scattered subcortical and periventricular white matter hypodensities, nonspecific but likely sequela of chronic small vessel ischemic disease. Vascular: No hyperdense vessel or unexpected calcification. Skull: Normal. Negative for fracture or focal lesion. Sinuses/Orbits: Scattered paranasal sinus mucosal thickening. Other: None. IMPRESSION: No acute intracranial abnormality. Electronically Signed   By: Maurine Simmering   On: 04/30/2021 13:21   CT Cervical Spine Wo Contrast  Result Date: 04/30/2021 CLINICAL DATA:  Neck trauma (Age >= 65y) EXAM: CT CERVICAL SPINE WITHOUT CONTRAST TECHNIQUE: Multidetector CT imaging of the cervical spine was performed without intravenous contrast. Multiplanar CT image reconstructions were also generated. COMPARISON:  None. FINDINGS: Alignment: Straightening of the normal cervical lordosis. Skull base and vertebrae: But there is no acute fracture. There is no aggressive osseous lesion. Soft tissues and spinal canal: No prevertebral fluid or swelling. No visible canal hematoma. Disc levels: Multilevel degenerative disc disease, worst at C5-C6 and C6-C7.Mild multilevel facet arthropathy, with fusion of the right C2-C3 facet joints. C1-C2 degenerative changes. Upper chest: Negative. Other: None. IMPRESSION: No acute cervical spine fracture. Multilevel degenerative changes as described above. Electronically Signed   By: Maurine Simmering   On: 04/30/2021 13:24   CARDIAC CATHETERIZATION  Result Date: 05/01/2021 Formatting of this result is different from the original.   Prox RCA-1 lesion is 50% stenosed.   Prox RCA-2 lesion is 50% stenosed.   Mid RCA lesion is 100% stenosed.   Prox Cx lesion is 30% stenosed.   Mid Cx to Dist Cx lesion is 50% stenosed.   Mid LAD-2 lesion is 20% stenosed.   Mid LAD-1 lesion is 99% stenosed.   Dist LAD lesion is 30% stenosed.   The left ventricular systolic function is normal.   LV end diastolic pressure is normal.   The left ventricular ejection fraction is  55-65% by visual estimate.   There is no mitral valve regurgitation. Severe heavily calcified mid LAD stenosis. Patent mid LAD stent with minimal restenosis. Mild to moderate Circumflex stenosis Chronic occlusion mid RCA. The distal branches fill briskly from left to right collaterals Preserved LV systolic function Unsuccessful attempt at PCI of the mid LAD lesion as I was unable to  cross the lesion with a wire. Recommendations: Will consult CT surgery for CABG as PCI was not successful despite a long attempt at crossing the lesion. He was loaded with Plavix 600 mg this am in anticipation of PCI. He will need Plavix washout before surgery.    Cardiac Studies   Cardiac catheterization (05/01/2021)  Conclusion      Prox RCA-1 lesion is 50% stenosed.   Prox RCA-2 lesion is 50% stenosed.   Mid RCA lesion is 100% stenosed.   Prox Cx lesion is 30% stenosed.   Mid Cx to Dist Cx lesion is 50% stenosed.   Mid LAD-2 lesion is 20% stenosed.   Mid LAD-1 lesion is 99% stenosed.   Dist LAD lesion is 30% stenosed.   The left ventricular systolic function is normal.   LV end diastolic pressure is normal.   The left ventricular ejection fraction is 55-65% by visual estimate.   There is no mitral valve regurgitation.   Severe heavily calcified mid LAD stenosis. Patent mid LAD stent with minimal restenosis. Mild to moderate Circumflex stenosis Chronic occlusion mid RCA. The distal branches fill briskly from left to right collaterals Preserved LV systolic function Unsuccessful attempt at PCI of the mid LAD lesion as I was unable to cross the lesion with a wire.   Recommendations: Will consult CT surgery for CABG as PCI was not successful despite a long attempt at crossing the lesion. He was loaded with Plavix 600 mg this am in anticipation of PCI. He will need Plavix washout before surgery. Coronary Diagrams   Diagnostic Dominance: Right    Intervention   Patient Profile     Brandon Haynes is a 80  y.o. male with a hx of CAD s/p LAD stent and occluded RCA with collaterals, hypertension, hyperlipidemia, chronic pain syndrome, bladder cancer s/p TURBT/chemotherapy/radical cystectomy and lymphadenectomy 12/13/19, gout, who is being seen 04/30/2021 for the evaluation of chest pain and syncope at the request of Dr. Doren Custard.   Assessment & Plan    1: CAD-history of prior mid LAD stent 2016 with presenting symptoms similar to 2 symptoms at that point.  His enzymes are negative.  Cardiac catheterization today revealed an occluded RCA which is old and a subtotally occluded mid LAD with a highly calcified lesion which Dr. Angelena Form was unable to cross and referred patient for CABG.  Because he was loaded with Plavix he will need Plavix washout prior to surgical revascularization.  2: Essential hypertension-blood pressure under adequate control metoprolol.  3: Hyperlipidemia-on high-dose statin therapy with LDL at goal.      For questions or updates, please contact [place] Please consult [email or web address] for contact info under        Signed, Quay Burow, MD  05/01/2021, 9:47 AM

## 2021-05-01 NOTE — Progress Notes (Signed)
ANTICOAGULATION CONSULT NOTE - Initial Consult  Pharmacy Consult for Heparin Indication: chest pain/ACS  No Known Allergies  Patient Measurements: Height: '6\' 2"'$  (188 cm) Weight: 92.4 kg (203 lb 11.3 oz) (Scale C) IBW/kg (Calculated) : 82.2 Heparin Dosing Weight: 92kg  Vital Signs: Temp: 97.8 F (36.6 C) (08/04 1001) Temp Source: Oral (08/04 1001) BP: 131/78 (08/04 0925) Pulse Rate: 64 (08/04 1001)  Labs: Recent Labs    04/30/21 1236 04/30/21 1308 04/30/21 1311 04/30/21 1930 05/01/21 0227  HGB 13.6  --  13.6 12.0*  --   HCT 43.3  --  40.0 37.3*  --   PLT 168  --   --  127*  --   LABPROT 12.5  --   --   --   --   INR 0.9  --   --   --   --   CREATININE 1.11  --  1.10 1.05 1.13  TROPONINIHS 5 5  --   --   --     Estimated Creatinine Clearance: 62.6 mL/min (by C-G formula based on SCr of 1.13 mg/dL).   Medical History: Past Medical History:  Diagnosis Date   Allergic rhinitis    allergy shots via [place]   Anxiety    Bladder cancer ([place]) 05/2019   INVASIVE HIGH GRADE PAPILLARY UROTHELIAL CARCINOMA, invading muscularis propia.  Lesion resected. No mets.  He got neoadjuvant chemo x 4 cycles.  Cystoprostectomy with ileal conduit formation 12/13/19 (Dr. Tresa Moore). 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 difficile colitis 01/09/2020; late May 2021   oral vancomycin   CAD, multiple vessel    a. CP/near-syncope 11/2014 s/p DES to MLAD, occluded RCA after takeoff of RV marginal with collaterals treated medically, otherwise mild nonobstructive disease. EF 55%;  b. 07/2015 Lexiscan CL: EF 56%, no ischemia/infarct.    Cataracts, both eyes    Chronic constipation    worse since ileal conduit surgery was done 2020   Chronic low back pain    DDD (degenerative disc disease), lumbar    GERD (gastroesophageal reflux disease)    PPI bid needed to control sx's   Gout    Hearing loss    History  of basal cell carcinoma    History of  adenomatous polyp of colon    Rpt colonoscopy recommended 02/2020->no further if no high risk lesions at that time.   History of prostate cancer 11/2019   grd 2 cancer with neg margins incidental on cystectomy path 11/2019.   Hypercholesteremia    Hypertension    Hx labile/uncontrolled.  Improved s/p visit to HTN clinic 2019.   Iron deficiency anemia 2017   Colonoscopy, EGD, and capsule endoscopy w/out obvious cause/source found.  Oral iron helping as of 03/2016.   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 No avid enhancement or mass effect. Obs by urol as of 10/2019 and 11/2020.    Medications:  Medications Prior to Admission  Medication Sig Dispense Refill Last Dose   acetaminophen (TYLENOL) 650 MG CR tablet Take 650 mg by mouth every 8 (eight) hours as needed for pain.   04/29/2021   allopurinol (ZYLOPRIM) 300 MG tablet TAKE 1 TABLET BY MOUTH EVERY DAY (Patient taking differently:  Take 300 mg by mouth daily.) 90 tablet 3 04/29/2021   aspirin EC 81 MG tablet Take 81 mg by mouth daily.   04/30/2021   Cholecalciferol (D3 PO) Take 1 capsule by mouth daily.   04/30/2021   desonide (DESOWEN) 0.05 % cream Apply 1 application topically daily as needed (dry skin).  1 Past Week   EPINEPHrine 0.3 mg/0.3 mL IJ SOAJ injection Inject 0.3 mg into the muscle as needed for anaphylaxis.   never   gabapentin (NEURONTIN) 300 MG capsule Take 300 mg by mouth 2 (two) times daily.   04/30/2021   hydrocortisone cream 1 % Apply 1 application topically 4 (four) times daily as needed for itching.   Past Week   loratadine (CLARITIN) 10 MG tablet Take 20 mg by mouth daily.   04/30/2021   metoprolol tartrate (LOPRESSOR) 25 MG tablet Take 0.5 tablets (12.5 mg total) by mouth 2 (two) times daily. 180 tablet 3 04/30/2021  at 0700   nitroGLYCERIN (NITROSTAT) 0.4 MG SL tablet PLACE 1 TABLET UNDER TONGUE EVERY 5 MINS, UP TO 3 DOSES AS NEEDED FOR CHEST PAIN (Patient taking differently: Place 0.4 mg under the tongue every 5 (five) minutes as needed for chest pain.) 25 tablet 1 04/30/2021   pantoprazole (PROTONIX) 40 MG tablet TAKE 1 TABLET BY MOUTH EVERY DAY (Patient taking differently: Take 40 mg by mouth daily.) 90 tablet 1 04/30/2021   PREVIDENT 5000 BOOSTER PLUS 1.1 % PSTE Take 1 application by mouth as directed.  1 Past Week   rosuvastatin (CRESTOR) 20 MG tablet Take 1 tablet (20 mg total) by mouth daily. 90 tablet 3 04/29/2021   traMADol (ULTRAM) 50 MG tablet TAKE 1 TABLET BY MOUTH 3 TIMES A DAY AS NEEDED FOR PAIN (Patient taking differently: Take 50-100 mg by mouth 3 (three) times daily as needed for moderate pain.) 90 tablet 5 04/29/2021   triamcinolone (NASACORT ALLERGY 24HR) 55 MCG/ACT AERO nasal inhaler Place 1 spray into the nose 2 (two) times daily as needed (allergies).   Past Week   Scheduled:   allopurinol  300 mg Oral Daily   aspirin EC  81 mg Oral Daily   gabapentin  300 mg Oral BID   metoprolol tartrate  12.5 mg Oral BID   pantoprazole  40 mg Oral Daily   rosuvastatin  20 mg Oral Daily   Infusions:   sodium chloride 75 mL/hr at 05/01/21 1016   heparin      Assessment: Pt with a hx of CAD who was admitted for ongoing fatigue and short of breath. He is s/p cath that showed multivessel dz with plan for CABG consult. Heparin to be started 8 hr post sheath removal at around 1700.   Hgb 12, plt 127 Scr 1.13 Goal of Therapy:  Heparin level 0.3-0.7 units/ml Monitor platelets by anticoagulation protocol: Yes   Plan:  Start heparin at 1400 units/hr at 1700 today 6 hr HL Daily HL and CBC  Onnie Boer, PharmD, BCIDP, AAHIVP, CPP Infectious Disease Pharmacist 05/01/2021 10:24 AM

## 2021-05-01 NOTE — ED Notes (Signed)
Pt appears to be asleep, NAD noted.

## 2021-05-02 ENCOUNTER — Other Ambulatory Visit (HOSPITAL_COMMUNITY): Payer: Medicare HMO

## 2021-05-02 DIAGNOSIS — I200 Unstable angina: Secondary | ICD-10-CM | POA: Diagnosis not present

## 2021-05-02 LAB — CBC
HCT: 38.7 % — ABNORMAL LOW (ref 39.0–52.0)
Hemoglobin: 12.6 g/dL — ABNORMAL LOW (ref 13.0–17.0)
MCH: 29.5 pg (ref 26.0–34.0)
MCHC: 32.6 g/dL (ref 30.0–36.0)
MCV: 90.6 fL (ref 80.0–100.0)
Platelets: 141 10*3/uL — ABNORMAL LOW (ref 150–400)
RBC: 4.27 MIL/uL (ref 4.22–5.81)
RDW: 15.4 % (ref 11.5–15.5)
WBC: 6.2 10*3/uL (ref 4.0–10.5)
nRBC: 0 % (ref 0.0–0.2)

## 2021-05-02 MED ORDER — ACETAMINOPHEN 325 MG PO TABS: 325.0000 mg | ORAL_TABLET | Freq: Four times a day (QID) | ORAL | Status: DC | PRN

## 2021-05-02 MED ORDER — ACETAMINOPHEN 325 MG PO TABS
650.0000 mg | ORAL_TABLET | Freq: Three times a day (TID) | ORAL | Status: DC
  Administered 2021-05-02 – 2021-05-05 (×11): 650 mg via ORAL
  Filled 2021-05-02 (×12): qty 2

## 2021-05-02 MED FILL — Nitroglycerin IV Soln 100 MCG/ML in D5W: INTRA_ARTERIAL | Qty: 10 | Status: AC

## 2021-05-02 NOTE — Progress Notes (Signed)
[facility] for Heparin Indication: chest pain/ACS  Labs: Recent Labs    04/30/21 1236 04/30/21 1308 04/30/21 1311 04/30/21 1930 05/01/21 0227 05/01/21 2240 05/02/21 0321  HGB 13.6  --  13.6 12.0*  --   --  12.6*  HCT 43.3  --  40.0 37.3*  --   --  38.7*  PLT 168  --   --  127*  --   --  141*  LABPROT 12.5  --   --   --   --   --   --   INR 0.9  --   --   --   --   --   --   HEPARINUNFRC  --   --   --   --   --  0.40  --   CREATININE 1.11  --  1.10 1.05 1.13  --   --   TROPONINIHS 5 5  --   --   --   --   --    Assessment: 80 yo male presented with chest pain and worsening fatigue and dyspnea on exertion. Of note, he has a history of CAD and received a DES to mid LAD in 2016. He underwent cath 8/4 that showed multivessel disease with plan for CABG. Heparin was started 8 hr post sheath removal at around 1700.   Heparin level this morning is therapeutic at 0.4 on 1400 units/hr. CBC unremarkable. No signs/symptoms of bleeding noted.  Goal of Therapy:  Heparin level 0.3-0.7 units/ml Monitor platelets by anticoagulation protocol: Yes   Plan:  Continue heparin at 1400 units/hr Daily HL and CBC Monitor for s/sx of bleeding  Thanks for allowing pharmacy to be a part of this patient's care.  Laurey Arrow, PharmD PGY1 Pharmacy Resident 05/02/2021  7:38 AM  Please check [email or web address] for unit-specific pharmacy phone numbers.

## 2021-05-02 NOTE — Plan of Care (Signed)

## 2021-05-02 NOTE — Progress Notes (Addendum)
Progress Note  Patient Name: Brandon Haynes Date of Encounter: 05/02/2021  [facility] Cardiologist: Fransico Him, MD   Subjective   Patient complains of significant lower back pain and asking for more pain medication.  No chest pain or shortness of breath.  Inpatient Medications    Scheduled Meds:  allopurinol  300 mg Oral Daily   aspirin EC  81 mg Oral Daily   gabapentin  300 mg Oral BID   metoprolol tartrate  12.5 mg Oral BID   pantoprazole  40 mg Oral Daily   rosuvastatin  20 mg Oral Daily   Continuous Infusions:  heparin 1,400 Units/hr (05/01/21 1742)   PRN Meds: acetaminophen, HYDROcodone-acetaminophen, nitroGLYCERIN, ondansetron (ZOFRAN) IV, traMADol   Vital Signs    Vitals:   05/01/21 1936 05/02/21 0007 05/02/21 0417 05/02/21 0733  BP: (!) 151/79 137/81 (!) 149/90 127/73  Pulse: 64 61 (!) 58 63  Resp: '16 16 16 16  '$ Temp: 98.3 F (36.8 C) 97.6 F (36.4 C) 97.6 F (36.4 C) 97.8 F (36.6 C)  TempSrc: Oral Oral Oral Oral  SpO2: 99% 97% 97% 96%  Weight:   92.4 kg   Height:        Intake/Output Summary (Last 24 hours) at 05/02/2021 1034 Last data filed at 05/02/2021 0842 Gross per 24 hour  Intake 742.29 ml  Output 2125 ml  Net -1382.71 ml   Last 3 Weights 05/02/2021 05/01/2021 05/01/2021  Weight (lbs) 203 lb 11.3 oz 203 lb 11.3 oz 212 lb  Weight (kg) 92.4 kg 92.4 kg 96.163 kg      Telemetry    NSR - Personally Reviewed  ECG    SR - Personally Reviewed  Physical Exam   GEN: No acute distress.   Neck: No JVD Cardiac: RRR, no murmurs, rubs, or gallops.  Respiratory: Clear to auscultation bilaterally. GI: Soft, nontender, non-distended  MS: No edema; No deformity. Neuro:  Nonfocal  Psych: Normal affect   Labs    High Sensitivity Troponin:   Recent Labs  Lab 04/30/21 1236 04/30/21 1308  TROPONINIHS 5 5      Chemistry Recent Labs  Lab 04/30/21 1236 04/30/21 1311 04/30/21 1930 05/01/21 0227  NA 140 141  --  140  K 4.3 4.5  --  3.9  CL  107 109  --  103  CO2 24  --   --  27  GLUCOSE 97 91  --  82  BUN 20 24*  --  18  CREATININE 1.11 1.10 1.05 1.13  CALCIUM 9.3  --   --  9.1  PROT 6.7  --   --   --   ALBUMIN 4.0  --   --   --   AST 21  --   --   --   ALT 16  --   --   --   ALKPHOS 40  --   --   --   BILITOT 1.0  --   --   --   GFRNONAA >60  --  >60 >60  ANIONGAP 9  --   --  10     Hematology Recent Labs  Lab 04/30/21 1236 04/30/21 1311 04/30/21 1930 05/02/21 0321  WBC 8.7  --  5.6 6.2  RBC 4.60  --  3.95* 4.27  HGB 13.6 13.6 12.0* 12.6*  HCT 43.3 40.0 37.3* 38.7*  MCV 94.1  --  94.4 90.6  MCH 29.6  --  30.4 29.5  MCHC 31.4  --  32.2 32.6  RDW 15.3  --  15.4 15.4  PLT 168  --  127* 141*    BNP Recent Labs  Lab 04/30/21 1256  BNP 125.7*     DDimer No results for input(s): DDIMER in the last 168 hours.   Radiology    DG Chest 2 View  Result Date: 04/30/2021 CLINICAL DATA:  Chest pain EXAM: CHEST - 2 VIEW COMPARISON:  Radiograph 11/18/2020, chest CT 02/19/2020 FINDINGS: Unchanged cardiomediastinal silhouette. No focal airspace disease. No pleural effusion or visible pneumothorax. No acute osseous abnormality. Thoracic spondylosis. IMPRESSION: No evidence of acute cardiopulmonary disease. Electronically Signed   By: Maurine Simmering   On: 04/30/2021 13:17   CT HEAD WO CONTRAST (5MM)  Result Date: 04/30/2021 CLINICAL DATA:  Head trauma, minor (Age >= 65y) EXAM: CT HEAD WITHOUT CONTRAST TECHNIQUE: Contiguous axial images were obtained from the base of the skull through the vertex without intravenous contrast. COMPARISON:  MRI 04/22/2006 FINDINGS: Brain: No evidence of acute infarction, hemorrhage, hydrocephalus, extra-axial collection or mass lesion/mass effect.The ventricles are normal in size.Scattered subcortical and periventricular white matter hypodensities, nonspecific but likely sequela of chronic small vessel ischemic disease. Vascular: No hyperdense vessel or unexpected calcification. Skull: Normal.  Negative for fracture or focal lesion. Sinuses/Orbits: Scattered paranasal sinus mucosal thickening. Other: None. IMPRESSION: No acute intracranial abnormality. Electronically Signed   By: Maurine Simmering   On: 04/30/2021 13:21   CT Cervical Spine Wo Contrast  Result Date: 04/30/2021 CLINICAL DATA:  Neck trauma (Age >= 65y) EXAM: CT CERVICAL SPINE WITHOUT CONTRAST TECHNIQUE: Multidetector CT imaging of the cervical spine was performed without intravenous contrast. Multiplanar CT image reconstructions were also generated. COMPARISON:  None. FINDINGS: Alignment: Straightening of the normal cervical lordosis. Skull base and vertebrae: But there is no acute fracture. There is no aggressive osseous lesion. Soft tissues and spinal canal: No prevertebral fluid or swelling. No visible canal hematoma. Disc levels: Multilevel degenerative disc disease, worst at C5-C6 and C6-C7.Mild multilevel facet arthropathy, with fusion of the right C2-C3 facet joints. C1-C2 degenerative changes. Upper chest: Negative. Other: None. IMPRESSION: No acute cervical spine fracture. Multilevel degenerative changes as described above. Electronically Signed   By: Maurine Simmering   On: 04/30/2021 13:24   CARDIAC CATHETERIZATION  Result Date: 05/01/2021 Formatting of this result is different from the original.   Prox RCA-1 lesion is 50% stenosed.   Prox RCA-2 lesion is 50% stenosed.   Mid RCA lesion is 100% stenosed.   Prox Cx lesion is 30% stenosed.   Mid Cx to Dist Cx lesion is 50% stenosed.   Mid LAD-2 lesion is 20% stenosed.   Mid LAD-1 lesion is 99% stenosed.   Dist LAD lesion is 30% stenosed.   The left ventricular systolic function is normal.   LV end diastolic pressure is normal.   The left ventricular ejection fraction is 55-65% by visual estimate.   There is no mitral valve regurgitation. Severe heavily calcified mid LAD stenosis. Patent mid LAD stent with minimal restenosis. Mild to moderate Circumflex stenosis Chronic occlusion mid RCA.  The distal branches fill briskly from left to right collaterals Preserved LV systolic function Unsuccessful attempt at PCI of the mid LAD lesion as I was unable to cross the lesion with a wire. Recommendations: Will consult CT surgery for CABG as PCI was not successful despite a long attempt at crossing the lesion. He was loaded with Plavix 600 mg this am in anticipation of PCI. He will need Plavix washout before  surgery.   ECHOCARDIOGRAM COMPLETE  Result Date: 05/01/2021    ECHOCARDIOGRAM REPORT   Patient Name:   Brandon Haynes Date of Exam: 05/01/2021 Medical Rec #:  [id number]     Height:       74.0 in Accession #:    [id number]    Weight:       203.7 lb Date of Birth:  04-23-1941     BSA:          2.190 m Patient Age:    61 years      BP:           125/79 mmHg Patient Gender: M             HR:           59 bpm. Exam Location:  Inpatient Procedure: 2D Echo, Cardiac Doppler, Color Doppler and 3D Echo Indications:    Chest Pain  History:        Patient has prior history of Echocardiogram examinations, most                 recent 12/04/2014. CAD; Signs/Symptoms:Fatigue.  Sonographer:    Merrie Roof RDCS Referring Phys: [id number] [facility] IMPRESSIONS  1. Left ventricular ejection fraction, by estimation, is 55 to 60%. The left ventricle has normal function. The left ventricle has no regional wall motion abnormalities. Left ventricular diastolic parameters are consistent with Grade I diastolic dysfunction (impaired relaxation).  2. Right ventricular systolic function is normal. The right ventricular size is normal. Tricuspid regurgitation signal is inadequate for assessing PA pressure.  3. Left atrial size was mildly dilated.  4. The mitral valve is normal in structure. Trivial mitral valve regurgitation. No evidence of mitral stenosis.  5. The aortic valve is tricuspid. Aortic valve regurgitation is mild. Mild aortic valve sclerosis is present, with no evidence of aortic valve stenosis.  6. Aortic dilatation noted. There  is borderline dilatation of the aortic root, measuring 37 mm.  7. The inferior vena cava is normal in size with greater than 50% respiratory variability, suggesting right atrial pressure of 3 mmHg. FINDINGS  Left Ventricle: Left ventricular ejection fraction, by estimation, is 55 to 60%. The left ventricle has normal function. The left ventricle has no regional wall motion abnormalities. The left ventricular internal cavity size was normal in size. There is  no left ventricular hypertrophy. Left ventricular diastolic parameters are consistent with Grade I diastolic dysfunction (impaired relaxation). Right Ventricle: The right ventricular size is normal. No increase in right ventricular wall thickness. Right ventricular systolic function is normal. Tricuspid regurgitation signal is inadequate for assessing PA pressure. Left Atrium: Left atrial size was mildly dilated. Right Atrium: Right atrial size was normal in size. Pericardium: There is no evidence of pericardial effusion. Mitral Valve: The mitral valve is normal in structure. There is mild thickening of the mitral valve leaflet(s). Mild mitral annular calcification. Trivial mitral valve regurgitation. No evidence of mitral valve stenosis. Tricuspid Valve: The tricuspid valve is normal in structure. Tricuspid valve regurgitation is not demonstrated. Aortic Valve: The aortic valve is tricuspid. Aortic valve regurgitation is mild. Mild aortic valve sclerosis is present, with no evidence of aortic valve stenosis. Aortic valve mean gradient measures 7.0 mmHg. Aortic valve peak gradient measures 11.4 mmHg. Aortic valve area, by VTI measures 3.09 cm. Pulmonic Valve: The pulmonic valve was normal in structure. Pulmonic valve regurgitation is not visualized. Aorta: Aortic dilatation noted. There is borderline dilatation of the aortic root, measuring 37 mm.  Venous: The inferior vena cava is normal in size with greater than 50% respiratory variability, suggesting right  atrial pressure of 3 mmHg. IAS/Shunts: No atrial level shunt detected by color flow Doppler.  LEFT VENTRICLE PLAX 2D LVIDd:         3.70 cm  Diastology LVIDs:         2.90 cm  LV e' medial:    4.57 cm/s LV PW:         1.00 cm  LV E/e' medial:  22.1 LV IVS:        1.20 cm  LV e' lateral:   6.74 cm/s LVOT diam:     2.20 cm  LV E/e' lateral: 15.0 LV SV:         122 LV SV Index:   56 LVOT Area:     3.80 cm                          3D Volume EF:                         3D EF:        59 %                         LV EDV:       101 ml                         LV ESV:       42 ml                         LV SV:        59 ml RIGHT VENTRICLE RV Basal diam:  3.00 cm LEFT ATRIUM             Index       RIGHT ATRIUM           Index LA diam:        3.30 cm 1.51 cm/m  RA Area:     19.00 cm LA Vol (A2C):   69.9 ml 31.91 ml/m RA Volume:   41.20 ml  18.81 ml/m LA Vol (A4C):   58.1 ml 26.52 ml/m LA Biplane Vol: 65.6 ml 29.95 ml/m  AORTIC VALVE AV Area (Vmax):    3.26 cm AV Area (Vmean):   2.93 cm AV Area (VTI):     3.09 cm AV Vmax:           169.00 cm/s AV Vmean:          121.000 cm/s AV VTI:            0.394 m AV Peak Grad:      11.4 mmHg AV Mean Grad:      7.0 mmHg LVOT Vmax:         145.00 cm/s LVOT Vmean:        93.300 cm/s LVOT VTI:          0.320 m LVOT/AV VTI ratio: 0.81  AORTA Ao Root diam: 3.70 cm MITRAL VALVE MV Area (PHT): 2.48 cm     SHUNTS MV Decel Time: 306 msec     Systemic VTI:  0.32 m MV E velocity: 101.00 cm/s  Systemic Diam: 2.20 cm MV A velocity: 142.00 cm/s MV E/A ratio:  0.71 Loralie Champagne MD Electronically signed by Loralie Champagne  MD Signature Date/Time: 05/01/2021/6:17:53 PM    Final     Cardiac Studies   LEFT HEART CATH AND CORONARY ANGIOGRAPHY    Conclusion      Prox RCA-1 lesion is 50% stenosed.   Prox RCA-2 lesion is 50% stenosed.   Mid RCA lesion is 100% stenosed.   Prox Cx lesion is 30% stenosed.   Mid Cx to Dist Cx lesion is 50% stenosed.   Mid LAD-2 lesion is 20% stenosed.   Mid  LAD-1 lesion is 99% stenosed.   Dist LAD lesion is 30% stenosed.   The left ventricular systolic function is normal.   LV end diastolic pressure is normal.   The left ventricular ejection fraction is 55-65% by visual estimate.   There is no mitral valve regurgitation.   Severe heavily calcified mid LAD stenosis. Patent mid LAD stent with minimal restenosis. Mild to moderate Circumflex stenosis Chronic occlusion mid RCA. The distal branches fill briskly from left to right collaterals Preserved LV systolic function Unsuccessful attempt at PCI of the mid LAD lesion as I was unable to cross the lesion with a wire.   Recommendations: Will consult CT surgery for CABG as PCI was not successful despite a long attempt at crossing the lesion. He was loaded with Plavix 600 mg this am in anticipation of PCI. He will need Plavix washout before surgery.  Diagnostic Dominance: Right    Echo 05/01/2021 1. Left ventricular ejection fraction, by estimation, is 55 to 60%. The  left ventricle has normal function. The left ventricle has no regional  wall motion abnormalities. Left ventricular diastolic parameters are  consistent with Grade I diastolic  dysfunction (impaired relaxation).   2. Right ventricular systolic function is normal. The right ventricular  size is normal. Tricuspid regurgitation signal is inadequate for assessing  PA pressure.   3. Left atrial size was mildly dilated.   4. The mitral valve is normal in structure. Trivial mitral valve  regurgitation. No evidence of mitral stenosis.   5. The aortic valve is tricuspid. Aortic valve regurgitation is mild.  Mild aortic valve sclerosis is present, with no evidence of aortic valve  stenosis.   6. Aortic dilatation noted. There is borderline dilatation of the aortic  root, measuring 37 mm.   7. The inferior vena cava is normal in size with greater than 50%  respiratory variability, suggesting right atrial pressure of 3 mmHg.   Patient  Profile     Brandon Haynes is a 80 y.o. male with a hx of CAD s/p LAD stent and occluded RCA with collaterals, hypertension, hyperlipidemia, chronic pain syndrome, bladder cancer s/p TURBT/chemotherapy/radical cystectomy and lymphadenectomy 12/13/19, gout, who is being seen 04/30/2021 for the evaluation of chest pain and syncope at the request of Dr. Doren Custard.     Assessment & Plan    CAD - Cath as above multivessel CAD.  Unsuccessful PCI attempt.  Seen by CT CS and plan for surgery next week after Plavix washout.  No recurrent chest pain. -Echocardiogram with preserved LV function -Continue aspirin, statin and beta-blocker  2. HLD - 01/28/2021: Cholesterol 130; HDL 60; LDL Cholesterol (Calc) 48; Triglycerides 135  -Continue Crestor 20 mg  3.  Chronic back pain -Patient with longstanding history of severe lumbar issue. This this is his main complaint. -Medications sorted out with help of pharmacist.  Will place on scheduled and as needed meds.  Will allow to ambulate with assistance.  For questions or updates, please contact [place] Please consult  [email or web address] for contact info under       Signed, Leanor Kail, PA  05/02/2021, 10:34 AM     Agree with note by Robbie Lis PA-C  Mr. Bassinger underwent cardiac catheterization by Dr. Angelena Form yesterday.  He demonstrated an occluded right which is old and a high-grade calcified subtotal occluded mid LAD which she was unable to cross.  He felt the patient was a suitable CABG candidate.  He was seen by Dr. Kipp Brood who has tentatively posted him for next Tuesday.  Otherwise, he remains asymptomatic.  His major complaint is of back pain on hydrocodone and tramadol.  Lorretta Harp, M.D., [place], [facility], [place] [address] [place], [place]  [address]  [phone] 05/02/2021 11:09 AM

## 2021-05-03 ENCOUNTER — Inpatient Hospital Stay (HOSPITAL_COMMUNITY): Payer: Medicare HMO

## 2021-05-03 DIAGNOSIS — Z01810 Encounter for preprocedural cardiovascular examination: Secondary | ICD-10-CM | POA: Diagnosis not present

## 2021-05-03 DIAGNOSIS — I200 Unstable angina: Secondary | ICD-10-CM | POA: Diagnosis not present

## 2021-05-03 LAB — CBC
HCT: 37.1 % — ABNORMAL LOW (ref 39.0–52.0)
Hemoglobin: 12 g/dL — ABNORMAL LOW (ref 13.0–17.0)
MCH: 29.4 pg (ref 26.0–34.0)
MCHC: 32.3 g/dL (ref 30.0–36.0)
MCV: 90.9 fL (ref 80.0–100.0)
Platelets: 132 10*3/uL — ABNORMAL LOW (ref 150–400)
RBC: 4.08 MIL/uL — ABNORMAL LOW (ref 4.22–5.81)
RDW: 15.4 % (ref 11.5–15.5)
WBC: 5.5 10*3/uL (ref 4.0–10.5)
nRBC: 0 % (ref 0.0–0.2)

## 2021-05-03 LAB — HEPARIN LEVEL (UNFRACTIONATED): Heparin Unfractionated: 0.63 IU/mL (ref 0.30–0.70)

## 2021-05-03 MED ORDER — DOCUSATE SODIUM 100 MG PO CAPS
100.0000 mg | ORAL_CAPSULE | Freq: Every day | ORAL | Status: DC | PRN
  Administered 2021-05-03 – 2021-05-05 (×3): 100 mg via ORAL
  Filled 2021-05-03 (×3): qty 1

## 2021-05-03 NOTE — Progress Notes (Signed)
During the shift change IV pump was off, patient stated that IV pump was beeping and he "pushed button". It look like he accidentally turned off the pump. Restarted heparin with night shift nurse, notified pharmacy, educated patient again.

## 2021-05-03 NOTE — Progress Notes (Addendum)
ANTICOAGULATION CONSULT NOTE - Follow Up Consult  Pharmacy Consult for heparin Indication: chest pain/ACS  No Known Allergies  Patient Measurements: Height: '6\' 2"'$  (188 cm) Weight: 92.9 kg (204 lb 14.4 oz) IBW/kg (Calculated) : 82.2 Heparin Dosing Weight: 92.4kg  Vital Signs: Temp: 97.5 F (36.4 C) (08/06 0336) Temp Source: Oral (08/06 0336) BP: 112/59 (08/06 0814) Pulse Rate: 66 (08/06 0814)  Labs: Recent Labs    04/30/21 1236 04/30/21 1308 04/30/21 1311 04/30/21 1930 05/01/21 0227 05/01/21 2240 05/02/21 0321 05/03/21 0429  HGB 13.6  --  13.6 12.0*  --   --  12.6* 12.0*  HCT 43.3  --  40.0 37.3*  --   --  38.7* 37.1*  PLT 168  --   --  127*  --   --  141* 132*  LABPROT 12.5  --   --   --   --   --   --   --   INR 0.9  --   --   --   --   --   --   --   HEPARINUNFRC  --   --   --   --   --  0.40  --   --   CREATININE 1.11  --  1.10 1.05 1.13  --   --   --   TROPONINIHS 5 5  --   --   --   --   --   --     Estimated Creatinine Clearance: 62.6 mL/min (by C-G formula based on SCr of 1.13 mg/dL).  Assessment: 80 yo male presented with chest pain and worsening fatigue and dyspnea on exertion. Of note, he has a history of CAD and received a DES to mid LAD in 2016. He underwent cath 8/4 that showed multivessel disease with plan for CABG. Heparin was started 8 hr post sheath removal at around 1700. Heparin was previously therapeutic at 0.4 on 1400 units/hr (8/4).   On 8/6 during shift change, the nurse noted that the IV pump accidentally turned off by the patient at an unknown time. It was restarted by the nurse '@0708'$    Goal of Therapy:  Heparin level 0.3-0.7 units/ml Monitor platelets by anticoagulation protocol: Yes   Plan:  Increase to heparin 1450 units/hr Check repeat heparin level at 1400 Daily heparin level and CBC Monitor for s/sx of bleeding ________________  [id number] 8/6 '@1550'$ : Heparin level 0.63 - within goal Decrease to heparin 1400units/hr Monitor  daily heparin level and CBC Monitor for s/sx of bleeding   Thank you for including pharmacy in the care of this patient.  Zenaida Deed, PharmD PGY1 Acute Care Pharmacy Resident  Phone: [phone] 05/03/2021  10:19 AM  Please check [email or web address] for unit-specific pharmacy phone numbers.

## 2021-05-03 NOTE — Progress Notes (Signed)
VASCULAR LAB    Pre CABG Dopplers have been performed.  See CV proc for preliminary results.   Rodnesha Elie, RVT 05/03/2021, 10:27 AM

## 2021-05-03 NOTE — Progress Notes (Signed)
[id number] Gave pt and family OHS booklet, care guide, staying in the tube handout and wrote down how to view pre op video. Pt able to demonstrate 2500 ml on IS with cues. Discussed sternal precautions and demonstrated how to get  up and down without use of arms. Family available after discharge to assist in care. Pt wanting to shave. Just completed bath. Will continue to follow. Graylon Good RN BSN 05/03/2021 1:45 PM

## 2021-05-03 NOTE — Progress Notes (Signed)
Progress Note  Patient Name: Brandon Haynes Date of Encounter: 05/03/2021  Primary Cardiologist:   Fransico Him, MD   Subjective   No chest pain.  He is constipated  Inpatient Medications    Scheduled Meds:  acetaminophen  650 mg Oral TID   allopurinol  300 mg Oral Daily   aspirin EC  81 mg Oral Daily   gabapentin  300 mg Oral BID   metoprolol tartrate  12.5 mg Oral BID   pantoprazole  40 mg Oral Daily   rosuvastatin  20 mg Oral Daily   Continuous Infusions:  heparin 1,400 Units/hr (05/03/21 0826)   PRN Meds: acetaminophen, HYDROcodone-acetaminophen, nitroGLYCERIN, ondansetron (ZOFRAN) IV, traMADol   Vital Signs    Vitals:   05/02/21 1250 05/02/21 2038 05/03/21 0336 05/03/21 0814  BP: 94/64 111/74 117/79 (!) 112/59  Pulse: (!) 58 63 (!) 56 66  Resp: '18 18 18 20  '$ Temp: 97.8 F (36.6 C) (!) 97.5 F (36.4 C) (!) 97.5 F (36.4 C)   TempSrc: Oral Oral Oral   SpO2: 100% 99% 98% 100%  Weight:   92.9 kg   Height:        Intake/Output Summary (Last 24 hours) at 05/03/2021 1030 Last data filed at 05/03/2021 0824 Gross per 24 hour  Intake 1199.68 ml  Output 2975 ml  Net -1775.32 ml   Filed Weights   05/01/21 1001 05/02/21 0417 05/03/21 0336  Weight: 92.4 kg 92.4 kg 92.9 kg    Telemetry    NSR - Personally Reviewed  ECG    NA - Personally Reviewed  Physical Exam   GEN: No acute distress.   Neck: No  JVD Cardiac: RRR, no murmurs, rubs, or gallops.  Respiratory: Clear  to auscultation bilaterally. GI: Soft, nontender, non-distended  MS: No  edema; No deformity.  Right radial without bleeding or bruising Neuro:  Nonfocal  Psych: Normal affect   Labs    Chemistry Recent Labs  Lab 04/30/21 1236 04/30/21 1311 04/30/21 1930 05/01/21 0227  NA 140 141  --  140  K 4.3 4.5  --  3.9  CL 107 109  --  103  CO2 24  --   --  27  GLUCOSE 97 91  --  82  BUN 20 24*  --  18  CREATININE 1.11 1.10 1.05 1.13  CALCIUM 9.3  --   --  9.1  PROT 6.7  --   --   --    ALBUMIN 4.0  --   --   --   AST 21  --   --   --   ALT 16  --   --   --   ALKPHOS 40  --   --   --   BILITOT 1.0  --   --   --   GFRNONAA >60  --  >60 >60  ANIONGAP 9  --   --  10     Hematology Recent Labs  Lab 04/30/21 1930 05/02/21 0321 05/03/21 0429  WBC 5.6 6.2 5.5  RBC 3.95* 4.27 4.08*  HGB 12.0* 12.6* 12.0*  HCT 37.3* 38.7* 37.1*  MCV 94.4 90.6 90.9  MCH 30.4 29.5 29.4  MCHC 32.2 32.6 32.3  RDW 15.4 15.4 15.4  PLT 127* 141* 132*    Cardiac EnzymesNo results for input(s): TROPONINI in the last 168 hours. No results for input(s): TROPIPOC in the last 168 hours.   BNP Recent Labs  Lab 04/30/21 1256  BNP 125.7*  DDimer No results for input(s): DDIMER in the last 168 hours.   Radiology    ECHOCARDIOGRAM COMPLETE  Result Date: 05/01/2021    ECHOCARDIOGRAM REPORT   Patient Name:   RAND FORSETH Date of Exam: 05/01/2021 Medical Rec #:  [id number]     Height:       74.0 in Accession #:    [id number]    Weight:       203.7 lb Date of Birth:  [birth year]     BSA:          2.190 m Patient Age:    80 years      BP:           125/79 mmHg Patient Gender: M             HR:           59 bpm. Exam Location:  Inpatient Procedure: 2D Echo, Cardiac Doppler, Color Doppler and 3D Echo Indications:    Chest Pain  History:        Patient has prior history of Echocardiogram examinations, most                 recent 12/04/2014. CAD; Signs/Symptoms:Fatigue.  Sonographer:    Merrie Roof RDCS Referring Phys: [id number] [facility] IMPRESSIONS  1. Left ventricular ejection fraction, by estimation, is 55 to 60%. The left ventricle has normal function. The left ventricle has no regional wall motion abnormalities. Left ventricular diastolic parameters are consistent with Grade I diastolic dysfunction (impaired relaxation).  2. Right ventricular systolic function is normal. The right ventricular size is normal. Tricuspid regurgitation signal is inadequate for assessing PA pressure.  3. Left atrial size was  mildly dilated.  4. The mitral valve is normal in structure. Trivial mitral valve regurgitation. No evidence of mitral stenosis.  5. The aortic valve is tricuspid. Aortic valve regurgitation is mild. Mild aortic valve sclerosis is present, with no evidence of aortic valve stenosis.  6. Aortic dilatation noted. There is borderline dilatation of the aortic root, measuring 37 mm.  7. The inferior vena cava is normal in size with greater than 50% respiratory variability, suggesting right atrial pressure of 3 mmHg. FINDINGS  Left Ventricle: Left ventricular ejection fraction, by estimation, is 55 to 60%. The left ventricle has normal function. The left ventricle has no regional wall motion abnormalities. The left ventricular internal cavity size was normal in size. There is  no left ventricular hypertrophy. Left ventricular diastolic parameters are consistent with Grade I diastolic dysfunction (impaired relaxation). Right Ventricle: The right ventricular size is normal. No increase in right ventricular wall thickness. Right ventricular systolic function is normal. Tricuspid regurgitation signal is inadequate for assessing PA pressure. Left Atrium: Left atrial size was mildly dilated. Right Atrium: Right atrial size was normal in size. Pericardium: There is no evidence of pericardial effusion. Mitral Valve: The mitral valve is normal in structure. There is mild thickening of the mitral valve leaflet(s). Mild mitral annular calcification. Trivial mitral valve regurgitation. No evidence of mitral valve stenosis. Tricuspid Valve: The tricuspid valve is normal in structure. Tricuspid valve regurgitation is not demonstrated. Aortic Valve: The aortic valve is tricuspid. Aortic valve regurgitation is mild. Mild aortic valve sclerosis is present, with no evidence of aortic valve stenosis. Aortic valve mean gradient measures 7.0 mmHg. Aortic valve peak gradient measures 11.4 mmHg. Aortic valve area, by VTI measures 3.09 cm.  Pulmonic Valve: The pulmonic valve was normal in structure. Pulmonic valve regurgitation is not visualized.  Aorta: Aortic dilatation noted. There is borderline dilatation of the aortic root, measuring 37 mm. Venous: The inferior vena cava is normal in size with greater than 50% respiratory variability, suggesting right atrial pressure of 3 mmHg. IAS/Shunts: No atrial level shunt detected by color flow Doppler.  LEFT VENTRICLE PLAX 2D LVIDd:         3.70 cm  Diastology LVIDs:         2.90 cm  LV e' medial:    4.57 cm/s LV PW:         1.00 cm  LV E/e' medial:  22.1 LV IVS:        1.20 cm  LV e' lateral:   6.74 cm/s LVOT diam:     2.20 cm  LV E/e' lateral: 15.0 LV SV:         122 LV SV Index:   56 LVOT Area:     3.80 cm                          3D Volume EF:                         3D EF:        59 %                         LV EDV:       101 ml                         LV ESV:       42 ml                         LV SV:        59 ml RIGHT VENTRICLE RV Basal diam:  3.00 cm LEFT ATRIUM             Index       RIGHT ATRIUM           Index LA diam:        3.30 cm 1.51 cm/m  RA Area:     19.00 cm LA Vol (A2C):   69.9 ml 31.91 ml/m RA Volume:   41.20 ml  18.81 ml/m LA Vol (A4C):   58.1 ml 26.52 ml/m LA Biplane Vol: 65.6 ml 29.95 ml/m  AORTIC VALVE AV Area (Vmax):    3.26 cm AV Area (Vmean):   2.93 cm AV Area (VTI):     3.09 cm AV Vmax:           169.00 cm/s AV Vmean:          121.000 cm/s AV VTI:            0.394 m AV Peak Grad:      11.4 mmHg AV Mean Grad:      7.0 mmHg LVOT Vmax:         145.00 cm/s LVOT Vmean:        93.300 cm/s LVOT VTI:          0.320 m LVOT/AV VTI ratio: 0.81  AORTA Ao Root diam: 3.70 cm MITRAL VALVE MV Area (PHT): 2.48 cm     SHUNTS MV Decel Time: 306 msec     Systemic VTI:  0.32 m MV E velocity: 101.00 cm/s  Systemic Diam: 2.20 cm MV A velocity: 142.00  cm/s MV E/A ratio:  0.71 Loralie Champagne MD Electronically signed by Loralie Champagne MD Signature Date/Time: 05/01/2021/6:17:53 PM    Final     VAS US DOPPLER PRE CABG  Result Date: 05/03/2021 PREOPERATIVE VASCULAR EVALUATION Patient Name:  ILIJAH FRAPPIER  Date of Exam:   05/03/2021 Medical Rec #: [id number]      Accession #:    [id number] Date of Birth: May 26, 1941      Patient Gender: M Patient Age:   39 years Exam Location:  [facility] Procedure:      VAS US DOPPLER PRE CABG Referring Phys: HARRELL LIGHTFOOT --------------------------------------------------------------------------------  Indications:      Pre-CABG. Risk Factors:     Hypertension, hyperlipidemia, coronary artery disease. Other Factors:    History of LAD stenting. History of Bladder cancer. Comparison Study: Prior normal carotid duplex done 12/04/2014. Prior normal ABI                   done 03/19/18 Performing Technologist: Sharion Dove RVS  Examination Guidelines: A complete evaluation includes B-mode imaging, spectral Doppler, color Doppler, and power Doppler as needed of all accessible portions of each vessel. Bilateral testing is considered an integral part of a complete examination. Limited examinations for reoccurring indications may be performed as noted.  Right Carotid Findings: +----------+--------+--------+--------+------------+------------------+           PSV cm/sEDV cm/sStenosisDescribe    Comments           +----------+--------+--------+--------+------------+------------------+ CCA Prox  68      10                          intimal thickening +----------+--------+--------+--------+------------+------------------+ CCA Distal68      13              heterogenous                   +----------+--------+--------+--------+------------+------------------+ ICA Prox  66      19      1-39%   heterogenous                   +----------+--------+--------+--------+------------+------------------+ ICA Distal79      21                                             +----------+--------+--------+--------+------------+------------------+ ECA       91       11                                             +----------+--------+--------+--------+------------+------------------+ +----------+--------+-------+--------+------------+           PSV cm/sEDV cmsDescribeArm Pressure +----------+--------+-------+--------+------------+ Subclavian97                                  +----------+--------+-------+--------+------------+ +---------+--------+--+--------+-+ VertebralPSV cm/s24EDV cm/s5 +---------+--------+--+--------+-+ Left Carotid Findings: +----------+--------+--------+--------+------------+------------------+           PSV cm/sEDV cm/sStenosisDescribe    Comments           +----------+--------+--------+--------+------------+------------------+ CCA Prox  94      18  intimal thickening +----------+--------+--------+--------+------------+------------------+ CCA Distal72      14                          intimal thickening +----------+--------+--------+--------+------------+------------------+ ICA Prox  82      26      1-39%   heterogenous                   +----------+--------+--------+--------+------------+------------------+ ICA Distal69      25                                             +----------+--------+--------+--------+------------+------------------+ ECA       62      14                                             +----------+--------+--------+--------+------------+------------------+ +----------+--------+--------+--------+------------+ SubclavianPSV cm/sEDV cm/sDescribeArm Pressure +----------+--------+--------+--------+------------+           41                                   +----------+--------+--------+--------+------------+ +---------+--------+--+--------+--+ VertebralPSV cm/s53EDV cm/s11 +---------+--------+--+--------+--+  ABI Findings: +--------+------------------+-----+---------+--------+ Right   Rt Pressure (mmHg)IndexWaveform Comment   +--------+------------------+-----+---------+--------+ [id number]                    triphasic         +--------+------------------+-----+---------+--------+ PTA                            triphasic         +--------+------------------+-----+---------+--------+ DP                             triphasic         +--------+------------------+-----+---------+--------+ +--------+------------------+-----+---------+--------+ Left    Lt Pressure (mmHg)IndexWaveform Comment  +--------+------------------+-----+---------+--------+ Brachial                                bandages +--------+------------------+-----+---------+--------+ PTA                            triphasic         +--------+------------------+-----+---------+--------+ DP                             triphasic         +--------+------------------+-----+---------+--------+  Right Doppler Findings: +--------+--------+-----+---------+--------+ Site    PressureIndexDoppler  Comments +--------+--------+-----+---------+--------+ [id number]          triphasic         +--------+--------+-----+---------+--------+ Radial               triphasic         +--------+--------+-----+---------+--------+ Ulnar                triphasic         +--------+--------+-----+---------+--------+  Left Doppler Findings: +--------+--------+-----+---------+--------+ Site    PressureIndexDoppler  Comments +--------+--------+-----+---------+--------+ Brachial  bandages +--------+--------+-----+---------+--------+ Radial               triphasic         +--------+--------+-----+---------+--------+ Ulnar                triphasic         +--------+--------+-----+---------+--------+  Summary: Right Carotid: Velocities in the right ICA are consistent with a 1-39% stenosis. Left Carotid: Velocities in the left ICA are consistent with a 1-39% stenosis. Vertebrals:  Bilateral vertebral  arteries demonstrate antegrade flow. Subclavians: Normal flow hemodynamics were seen in bilateral subclavian              arteries. Right Upper Extremity: Doppler waveform obliterate with right radial compression. Doppler waveform obliterate with right ulnar compression. Left Upper Extremity: Doppler waveforms remain within normal limits with left radial compression. Doppler waveforms decrease 50% with left ulnar compression.     Preliminary     Cardiac Studies   Diagnostic Dominance: Right      Echo 05/01/2021 1. Left ventricular ejection fraction, by estimation, is 55 to 60%. The  left ventricle has normal function. The left ventricle has no regional  wall motion abnormalities. Left ventricular diastolic parameters are  consistent with Grade I diastolic  dysfunction (impaired relaxation).   2. Right ventricular systolic function is normal. The right ventricular  size is normal. Tricuspid regurgitation signal is inadequate for assessing  PA pressure.   3. Left atrial size was mildly dilated.   4. The mitral valve is normal in structure. Trivial mitral valve  regurgitation. No evidence of mitral stenosis.   5. The aortic valve is tricuspid. Aortic valve regurgitation is mild.  Mild aortic valve sclerosis is present, with no evidence of aortic valve  stenosis.   6. Aortic dilatation noted. There is borderline dilatation of the aortic  root, measuring 37 mm.   7. The inferior vena cava is normal in size with greater than 50%  respiratory variability, suggesting right atrial pressure of 3 mmHg.  Patient Profile     80 y.o. male  with a hx of CAD s/p LAD stent and occluded RCA with collaterals, hypertension, hyperlipidemia, chronic pain syndrome, bladder cancer s/p TURBT/chemotherapy/radical cystectomy and lymphadenectomy 12/13/19, gout, who is being seen 04/30/2021 for the evaluation of chest pain and syncope at the request of Dr. Doren Custard.     Assessment & Plan   CHEST PAIN:  CAD as above.   Plan for CABG next week.   DYSLIPIDEMIA:    Continue current therapy.       CONSTIPATION:  Will order stool softener.    For questions or updates, please contact [place] Please consult [email or web address] for contact info under Cardiology/STEMI.   Signed, Minus Breeding, MD  05/03/2021, 10:30 AM

## 2021-05-04 DIAGNOSIS — I10 Essential (primary) hypertension: Secondary | ICD-10-CM | POA: Diagnosis not present

## 2021-05-04 DIAGNOSIS — I200 Unstable angina: Secondary | ICD-10-CM | POA: Diagnosis not present

## 2021-05-04 LAB — CBC
HCT: 37.4 % — ABNORMAL LOW (ref 39.0–52.0)
Hemoglobin: 12.6 g/dL — ABNORMAL LOW (ref 13.0–17.0)
MCH: 30.2 pg (ref 26.0–34.0)
MCHC: 33.7 g/dL (ref 30.0–36.0)
MCV: 89.7 fL (ref 80.0–100.0)
Platelets: 147 10*3/uL — ABNORMAL LOW (ref 150–400)
RBC: 4.17 MIL/uL — ABNORMAL LOW (ref 4.22–5.81)
RDW: 15.3 % (ref 11.5–15.5)
WBC: 6.1 10*3/uL (ref 4.0–10.5)
nRBC: 0 % (ref 0.0–0.2)

## 2021-05-04 LAB — HEPARIN LEVEL (UNFRACTIONATED): Heparin Unfractionated: 0.61 IU/mL (ref 0.30–0.70)

## 2021-05-04 MED ORDER — POLYETHYLENE GLYCOL 3350 17 G PO PACK
17.0000 g | PACK | Freq: Every day | ORAL | Status: DC | PRN
  Administered 2021-05-04: 17 g via ORAL
  Filled 2021-05-04: qty 1

## 2021-05-04 NOTE — Progress Notes (Signed)
ANTICOAGULATION CONSULT NOTE - Follow Up Consult  Pharmacy Consult for heparin Indication: chest pain/ACS  No Known Allergies  Patient Measurements: Height: '6\' 2"'$  (188 cm) Weight: 93.2 kg (205 lb 6.4 oz) IBW/kg (Calculated) : 82.2 Heparin Dosing Weight: 92.4kg  Vital Signs: Temp: 97.6 F (36.4 C) (08/07 1056) Temp Source: Oral (08/07 1056) BP: 106/62 (08/07 1056) Pulse Rate: 64 (08/07 1056)  Labs: Recent Labs    05/01/21 2240 05/02/21 0321 05/02/21 0321 05/03/21 0429 05/03/21 1500 05/04/21 0316  HGB  --  12.6*   < > 12.0*  --  12.6*  HCT  --  38.7*  --  37.1*  --  37.4*  PLT  --  141*  --  132*  --  147*  HEPARINUNFRC 0.40  --   --   --  0.63 0.61   < > = values in this interval not displayed.     Estimated Creatinine Clearance: 62.6 mL/min (by C-G formula based on SCr of 1.13 mg/dL).  Assessment: 80 yo male presented with chest pain and worsening fatigue and dyspnea on exertion. Of note, he has a history of CAD and received a DES to mid LAD in 2016. He underwent cath 8/4 that showed multivessel disease with plan for CABG. Heparin was started 8 hr post sheath removal at around 1700. Heparin was previously therapeutic at 0.4 on 1400 units/hr (8/4). On 8/6, the rate was empirically increased to heparin 1450 units/hr due to the patient turning off the IV pump at an unknown time, and the next level was 0.63. Based on level within goal, the rate was reduced back to 1400 units/hr.  Heparin level today 0.61 - within goal. Hgb 12.6, plt 147. No s/sx of bleeding.   Goal of Therapy:  Heparin level 0.3-0.7 units/ml Monitor platelets by anticoagulation protocol: Yes   Plan:  Continue heparin 1400 units/hr Daily heparin level and CBC Monitor for s/sx of bleeding  Thank you for including pharmacy in the care of this patient.  Zenaida Deed, PharmD PGY1 Acute Care Pharmacy Resident  Phone: [phone] 05/04/2021  3:32 PM  Please check [email or web address] for unit-specific pharmacy  phone numbers.

## 2021-05-04 NOTE — Progress Notes (Addendum)
Progress Note  Patient Name: Brandon Haynes Date of Encounter: 05/04/2021  [facility] Cardiologist: Fransico Him, MD   Subjective   No chest pain. Chronic back pain, bed makes it worse. Mostly controlled with home meds  Inpatient Medications    Scheduled Meds:  acetaminophen  650 mg Oral TID   allopurinol  300 mg Oral Daily   aspirin EC  81 mg Oral Daily   gabapentin  300 mg Oral BID   metoprolol tartrate  12.5 mg Oral BID   pantoprazole  40 mg Oral Daily   rosuvastatin  20 mg Oral Daily   Continuous Infusions:  heparin 1,400 Units/hr (05/03/21 1654)   PRN Meds: acetaminophen, docusate sodium, HYDROcodone-acetaminophen, nitroGLYCERIN, ondansetron (ZOFRAN) IV, traMADol   Vital Signs    Vitals:   05/03/21 1054 05/03/21 1931 05/04/21 0405 05/04/21 0839  BP: 93/61 109/71 (!) 100/50 104/67  Pulse: (!) 56 69 60 63  Resp: '18 16 16 18  '$ Temp: 98.3 F (36.8 C) 98.4 F (36.9 C) 97.7 F (36.5 C) 97.7 F (36.5 C)  TempSrc: Oral Oral Oral Oral  SpO2: 99% 97% 94% 97%  Weight:   93.2 kg   Height:        Intake/Output Summary (Last 24 hours) at 05/04/2021 0923 Last data filed at 05/04/2021 0829 Gross per 24 hour  Intake 3412.01 ml  Output 2075 ml  Net 1337.01 ml   Last 3 Weights 05/04/2021 05/03/2021 05/02/2021  Weight (lbs) 205 lb 6.4 oz 204 lb 14.4 oz 203 lb 11.3 oz  Weight (kg) 93.169 kg 92.942 kg 92.4 kg      Telemetry    SR - Personally Reviewed  ECG    No new tracing this morning  Physical Exam   GEN: No acute distress.   Neck: No JVD Cardiac: RRR, no murmurs, rubs, or gallops.  Respiratory: Clear to auscultation bilaterally. GI: Soft, nontender, non-distended  MS: No edema; No deformity. Right radial site stable.  Neuro:  Nonfocal  Psych: Normal affect   Labs    High Sensitivity Troponin:   Recent Labs  Lab 04/30/21 1236 04/30/21 1308  TROPONINIHS 5 5      Chemistry Recent Labs  Lab 04/30/21 1236 04/30/21 1311 04/30/21 1930 05/01/21 0227   NA 140 141  --  140  K 4.3 4.5  --  3.9  CL 107 109  --  103  CO2 24  --   --  27  GLUCOSE 97 91  --  82  BUN 20 24*  --  18  CREATININE 1.11 1.10 1.05 1.13  CALCIUM 9.3  --   --  9.1  PROT 6.7  --   --   --   ALBUMIN 4.0  --   --   --   AST 21  --   --   --   ALT 16  --   --   --   ALKPHOS 40  --   --   --   BILITOT 1.0  --   --   --   GFRNONAA >60  --  >60 >60  ANIONGAP 9  --   --  10     Hematology Recent Labs  Lab 05/02/21 0321 05/03/21 0429 05/04/21 0316  WBC 6.2 5.5 6.1  RBC 4.27 4.08* 4.17*  HGB 12.6* 12.0* 12.6*  HCT 38.7* 37.1* 37.4*  MCV 90.6 90.9 89.7  MCH 29.5 29.4 30.2  MCHC 32.6 32.3 33.7  RDW 15.4 15.4 15.3  PLT 141* 132* 147*    BNP Recent Labs  Lab 04/30/21 1256  BNP 125.7*     DDimer No results for input(s): DDIMER in the last 168 hours.   Radiology    VAS US DOPPLER PRE CABG  Result Date: 05/03/2021 PREOPERATIVE VASCULAR EVALUATION Patient Name:  DANGEL MATHE  Date of Exam:   05/03/2021 Medical Rec #: [id number]      Accession #:    [id number] Date of Birth: [birth year]      Patient Gender: M Patient Age:   80 years Exam Location:  [facility] Procedure:      VAS US DOPPLER PRE CABG Referring Phys: HARRELL LIGHTFOOT --------------------------------------------------------------------------------  Indications:      Pre-CABG. Risk Factors:     Hypertension, hyperlipidemia, coronary artery disease. Other Factors:    History of LAD stenting. History of Bladder cancer. Comparison Study: Prior normal carotid duplex done 12/04/2014. Prior normal ABI                   done 03/19/18 Performing Technologist: Sharion Dove RVS  Examination Guidelines: A complete evaluation includes B-mode imaging, spectral Doppler, color Doppler, and power Doppler as needed of all accessible portions of each vessel. Bilateral testing is considered an integral part of a complete examination. Limited examinations for reoccurring indications may be performed as noted.  Right  Carotid Findings: +----------+--------+--------+--------+------------+------------------+           PSV cm/sEDV cm/sStenosisDescribe    Comments           +----------+--------+--------+--------+------------+------------------+ CCA Prox  68      10                          intimal thickening +----------+--------+--------+--------+------------+------------------+ CCA Distal68      13              heterogenous                   +----------+--------+--------+--------+------------+------------------+ ICA Prox  66      19      1-39%   heterogenous                   +----------+--------+--------+--------+------------+------------------+ ICA Distal79      21                                             +----------+--------+--------+--------+------------+------------------+ ECA       91      11                                             +----------+--------+--------+--------+------------+------------------+ +----------+--------+-------+--------+------------+           PSV cm/sEDV cmsDescribeArm Pressure +----------+--------+-------+--------+------------+ Subclavian97                                  +----------+--------+-------+--------+------------+ +---------+--------+--+--------+-+ VertebralPSV cm/s24EDV cm/s5 +---------+--------+--+--------+-+ Left Carotid Findings: +----------+--------+--------+--------+------------+------------------+           PSV cm/sEDV cm/sStenosisDescribe    Comments           +----------+--------+--------+--------+------------+------------------+ CCA Prox  94      18  intimal thickening +----------+--------+--------+--------+------------+------------------+ CCA Distal72      14                          intimal thickening +----------+--------+--------+--------+------------+------------------+ ICA Prox  82      26      1-39%   heterogenous                    +----------+--------+--------+--------+------------+------------------+ ICA Distal69      25                                             +----------+--------+--------+--------+------------+------------------+ ECA       62      14                                             +----------+--------+--------+--------+------------+------------------+ +----------+--------+--------+--------+------------+ SubclavianPSV cm/sEDV cm/sDescribeArm Pressure +----------+--------+--------+--------+------------+           41                                   +----------+--------+--------+--------+------------+ +---------+--------+--+--------+--+ VertebralPSV cm/s53EDV cm/s11 +---------+--------+--+--------+--+  ABI Findings: +--------+------------------+-----+---------+--------+ Right   Rt Pressure (mmHg)IndexWaveform Comment  +--------+------------------+-----+---------+--------+ [id number]                    triphasic         +--------+------------------+-----+---------+--------+ PTA                            triphasic         +--------+------------------+-----+---------+--------+ DP                             triphasic         +--------+------------------+-----+---------+--------+ +--------+------------------+-----+---------+--------+ Left    Lt Pressure (mmHg)IndexWaveform Comment  +--------+------------------+-----+---------+--------+ Brachial                                bandages +--------+------------------+-----+---------+--------+ PTA                            triphasic         +--------+------------------+-----+---------+--------+ DP                             triphasic         +--------+------------------+-----+---------+--------+  Right Doppler Findings: +--------+--------+-----+---------+--------+ Site    PressureIndexDoppler  Comments +--------+--------+-----+---------+--------+ [id number]          triphasic          +--------+--------+-----+---------+--------+ Radial               triphasic         +--------+--------+-----+---------+--------+ Ulnar                triphasic         +--------+--------+-----+---------+--------+  Left Doppler Findings: +--------+--------+-----+---------+--------+ Site    PressureIndexDoppler  Comments +--------+--------+-----+---------+--------+ Brachial  bandages +--------+--------+-----+---------+--------+ Radial               triphasic         +--------+--------+-----+---------+--------+ Ulnar                triphasic         +--------+--------+-----+---------+--------+  Summary: Right Carotid: Velocities in the right ICA are consistent with a 1-39% stenosis. Left Carotid: Velocities in the left ICA are consistent with a 1-39% stenosis. Vertebrals:  Bilateral vertebral arteries demonstrate antegrade flow. Subclavians: Normal flow hemodynamics were seen in bilateral subclavian              arteries. Right Upper Extremity: Doppler waveform obliterate with right radial compression. Doppler waveform obliterate with right ulnar compression. Left Upper Extremity: Doppler waveforms remain within normal limits with left radial compression. Doppler waveforms decrease 50% with left ulnar compression.     Preliminary     Cardiac Studies   Cardiac catheterization (05/01/2021)   Conclusion       Prox RCA-1 lesion is 50% stenosed.   Prox RCA-2 lesion is 50% stenosed.   Mid RCA lesion is 100% stenosed.   Prox Cx lesion is 30% stenosed.   Mid Cx to Dist Cx lesion is 50% stenosed.   Mid LAD-2 lesion is 20% stenosed.   Mid LAD-1 lesion is 99% stenosed.   Dist LAD lesion is 30% stenosed.   The left ventricular systolic function is normal.   LV end diastolic pressure is normal.   The left ventricular ejection fraction is 55-65% by visual estimate.   There is no mitral valve regurgitation.   Severe heavily calcified mid LAD stenosis. Patent  mid LAD stent with minimal restenosis. Mild to moderate Circumflex stenosis Chronic occlusion mid RCA. The distal branches fill briskly from left to right collaterals Preserved LV systolic function Unsuccessful attempt at PCI of the mid LAD lesion as I was unable to cross the lesion with a wire.   Recommendations: Will consult CT surgery for CABG as PCI was not successful despite a long attempt at crossing the lesion. He was loaded with Plavix 600 mg this am in anticipation of PCI. He will need Plavix washout before surgery. Coronary Diagrams     Diagnostic Dominance: Right       Patient Profile     80 y.o. male with a hx of CAD s/p LAD stent and occluded RCA with collaterals, hypertension, hyperlipidemia, chronic pain syndrome, bladder cancer s/p TURBT/chemotherapy/radical cystectomy and lymphadenectomy 12/13/19, gout, who is being seen 04/30/2021 for the evaluation of chest pain and syncope at the request of Dr. Doren Custard.   Assessment & Plan    Chest pain: Underwent cardiac cath noted above with occluded RCA, subtotally occluded mLAD and diffuse Lcx disease. Recommendations for TCTS consult. Seen by Dr. Kipp Brood with plans for CABG 8/9 after plavix washout -- on IV heparin, ASA, statin, and low dose metoprolol tartrate   HTN: stable with low dose metoprolol, unable to further titrate.  HLD: LDL at goal -- on statin  History of bladder cancer: No acute issue at this time, follow-up with urology as scheduled   Gout prophylaxis: continued on home medicine allopurinol   GERD: on PPI  For questions or updates, please contact [facility] Please consult [email or web address] for contact info under        Signed, Reino Bellis, NP  05/04/2021, 9:23 AM     I have seen, examined the patient, and reviewed the above assessment and plan.  Changes  to above are made where necessary.  On exam, RRR.  No chest pain today.  Continue current medical therapy with plans for CABG 8/9.  Co Sign:  Thompson Grayer, MD 05/04/2021 10:14 AM

## 2021-05-05 DIAGNOSIS — I200 Unstable angina: Secondary | ICD-10-CM | POA: Diagnosis not present

## 2021-05-05 LAB — URINALYSIS, ROUTINE W REFLEX MICROSCOPIC
Bilirubin Urine: NEGATIVE
Glucose, UA: NEGATIVE mg/dL
Ketones, ur: NEGATIVE mg/dL
Nitrite: NEGATIVE
Protein, ur: NEGATIVE mg/dL
Specific Gravity, Urine: 1.004 — ABNORMAL LOW (ref 1.005–1.030)
pH: 7 (ref 5.0–8.0)

## 2021-05-05 LAB — PLATELET INHIBITION P2Y12: Platelet Function  P2Y12: 139 [PRU] — ABNORMAL LOW (ref 182–335)

## 2021-05-05 LAB — BASIC METABOLIC PANEL
Anion gap: 7 (ref 5–15)
BUN: 8 mg/dL (ref 8–23)
CO2: 25 mmol/L (ref 22–32)
Calcium: 8.9 mg/dL (ref 8.9–10.3)
Chloride: 108 mmol/L (ref 98–111)
Creatinine, Ser: 0.98 mg/dL (ref 0.61–1.24)
GFR, Estimated: >60 mL/min (ref >60)
Glucose, Bld: 86 mg/dL (ref 70–99)
Potassium: 3.8 mmol/L (ref 3.5–5.1)
Sodium: 140 mmol/L (ref 135–145)

## 2021-05-05 LAB — HEPARIN LEVEL (UNFRACTIONATED): Heparin Unfractionated: 0.62 IU/mL (ref 0.30–0.70)

## 2021-05-05 LAB — SURGICAL PCR SCREEN
MRSA, PCR: NEGATIVE
Staphylococcus aureus: POSITIVE — AB

## 2021-05-05 LAB — CBC
HCT: 39.8 % (ref 39.0–52.0)
Hemoglobin: 12.9 g/dL — ABNORMAL LOW (ref 13.0–17.0)
MCH: 29.7 pg (ref 26.0–34.0)
MCHC: 32.4 g/dL (ref 30.0–36.0)
MCV: 91.5 fL (ref 80.0–100.0)
Platelets: 154 10*3/uL (ref 150–400)
RBC: 4.35 MIL/uL (ref 4.22–5.81)
RDW: 15.5 % (ref 11.5–15.5)
WBC: 6.5 10*3/uL (ref 4.0–10.5)
nRBC: 0 % (ref 0.0–0.2)

## 2021-05-05 LAB — PREPARE RBC (CROSSMATCH)

## 2021-05-05 MED ORDER — CHLORHEXIDINE GLUCONATE 0.12 % MT SOLN
15.0000 mL | Freq: Once | OROMUCOSAL | Status: AC
  Administered 2021-05-06: 15 mL via OROMUCOSAL
  Filled 2021-05-05: qty 15

## 2021-05-05 MED ORDER — NOREPINEPHRINE 4 MG/250ML-% IV SOLN
0.0000 ug/min | INTRAVENOUS | Status: DC
  Filled 2021-05-05: qty 250

## 2021-05-05 MED ORDER — MUPIROCIN 2 % EX OINT
TOPICAL_OINTMENT | Freq: Two times a day (BID) | CUTANEOUS | Status: DC
  Administered 2021-05-07 – 2021-05-11 (×4): 1 via NASAL
  Filled 2021-05-05 (×3): qty 22

## 2021-05-05 MED ORDER — EPINEPHRINE HCL 5 MG/250ML IV SOLN IN NS
0.0000 ug/min | INTRAVENOUS | Status: DC
  Filled 2021-05-05: qty 250

## 2021-05-05 MED ORDER — VANCOMYCIN HCL 1500 MG/300ML IV SOLN
1500.0000 mg | INTRAVENOUS | Status: AC
  Administered 2021-05-06: 1500 mg via INTRAVENOUS
  Filled 2021-05-05: qty 300

## 2021-05-05 MED ORDER — CEFAZOLIN SODIUM-DEXTROSE 2-4 GM/100ML-% IV SOLN
2.0000 g | INTRAVENOUS | Status: AC
  Administered 2021-05-06: 2 g via INTRAVENOUS
  Filled 2021-05-05: qty 100

## 2021-05-05 MED ORDER — CHLORHEXIDINE GLUCONATE CLOTH 2 % EX PADS
6.0000 | MEDICATED_PAD | Freq: Once | CUTANEOUS | Status: AC
  Administered 2021-05-05: 6 via TOPICAL

## 2021-05-05 MED ORDER — CEFAZOLIN SODIUM-DEXTROSE 2-4 GM/100ML-% IV SOLN
2.0000 g | INTRAVENOUS | Status: DC
  Administered 2021-05-06: 2 g via INTRAVENOUS
  Filled 2021-05-05: qty 100

## 2021-05-05 MED ORDER — MANNITOL 20 % IV SOLN
Freq: Once | INTRAVENOUS | Status: DC
  Filled 2021-05-05: qty 13

## 2021-05-05 MED ORDER — METOPROLOL TARTRATE 12.5 MG HALF TABLET
12.5000 mg | ORAL_TABLET | Freq: Once | ORAL | Status: AC
  Administered 2021-05-06: 12.5 mg via ORAL
  Filled 2021-05-05: qty 1

## 2021-05-05 MED ORDER — INSULIN REGULAR(HUMAN) IN NACL 100-0.9 UT/100ML-% IV SOLN
INTRAVENOUS | Status: DC
  Filled 2021-05-05: qty 100

## 2021-05-05 MED ORDER — TRANEXAMIC ACID 1000 MG/10ML IV SOLN
1.5000 mg/kg/h | INTRAVENOUS | Status: AC
  Administered 2021-05-06: 1.5 mg/kg/h via INTRAVENOUS
  Filled 2021-05-05: qty 25

## 2021-05-05 MED ORDER — BISACODYL 5 MG PO TBEC: 5.0000 mg | DELAYED_RELEASE_TABLET | Freq: Once | ORAL | Status: DC

## 2021-05-05 MED ORDER — LACTULOSE 10 GM/15ML PO SOLN: 30.0000 g | Freq: Two times a day (BID) | ORAL | Status: DC | PRN

## 2021-05-05 MED ORDER — PHENYLEPHRINE HCL-NACL 20-0.9 MG/250ML-% IV SOLN
30.0000 ug/min | INTRAVENOUS | Status: DC
  Filled 2021-05-05: qty 250

## 2021-05-05 MED ORDER — NITROGLYCERIN IN D5W 200-5 MCG/ML-% IV SOLN
2.0000 ug/min | INTRAVENOUS | Status: DC
  Filled 2021-05-05: qty 250

## 2021-05-05 MED ORDER — CHLORHEXIDINE GLUCONATE CLOTH 2 % EX PADS
6.0000 | MEDICATED_PAD | Freq: Once | CUTANEOUS | Status: AC
  Administered 2021-05-06: 6 via TOPICAL

## 2021-05-05 MED ORDER — POTASSIUM CHLORIDE 2 MEQ/ML IV SOLN
80.0000 meq | INTRAVENOUS | Status: DC
  Filled 2021-05-05: qty 40

## 2021-05-05 MED ORDER — PLASMA-LYTE A IV SOLN
INTRAVENOUS | Status: DC
  Filled 2021-05-05: qty 5

## 2021-05-05 MED ORDER — SODIUM CHLORIDE 0.9 % IV SOLN
INTRAVENOUS | Status: DC
  Filled 2021-05-05: qty 30

## 2021-05-05 MED ORDER — MILRINONE LACTATE IN DEXTROSE 20-5 MG/100ML-% IV SOLN
0.3000 ug/kg/min | INTRAVENOUS | Status: DC
  Filled 2021-05-05: qty 100

## 2021-05-05 MED ORDER — TRANEXAMIC ACID (OHS) PUMP PRIME SOLUTION
2.0000 mg/kg | INTRAVENOUS | Status: DC
  Filled 2021-05-05: qty 1.86

## 2021-05-05 MED ORDER — TEMAZEPAM 15 MG PO CAPS
15.0000 mg | ORAL_CAPSULE | Freq: Once | ORAL | Status: AC | PRN
  Administered 2021-05-05: 15 mg via ORAL
  Filled 2021-05-05: qty 1

## 2021-05-05 MED ORDER — TRANEXAMIC ACID (OHS) BOLUS VIA INFUSION
15.0000 mg/kg | INTRAVENOUS | Status: AC
  Administered 2021-05-06: 1396.5 mg via INTRAVENOUS
  Filled 2021-05-05: qty 1397

## 2021-05-05 MED ORDER — DEXMEDETOMIDINE HCL IN NACL 400 MCG/100ML IV SOLN
0.1000 ug/kg/h | INTRAVENOUS | Status: DC
  Filled 2021-05-05: qty 100

## 2021-05-05 NOTE — Progress Notes (Signed)
Patient received bath and changed his urostomy bag with his wife. RN able to catch clean urine from urostomy bag after it was placed and lined correctly for urinalysis.

## 2021-05-05 NOTE — Anesthesia Preprocedure Evaluation (Addendum)
Anesthesia Evaluation  Patient identified by MRN, date of birth, ID band Patient awake    Reviewed: Allergy & Precautions, NPO status , Patient's Chart, lab work & pertinent test results  Airway Mallampati: I  TM Distance: >3 FB Neck ROM: Full    Dental  (+) Dental Advisory Given, Teeth Intact   Pulmonary neg pulmonary ROS, former smoker,    Pulmonary exam normal breath sounds clear to auscultation       Cardiovascular hypertension, Pt. on medications and Pt. on home beta blockers + angina + CAD, + Cardiac Stents and + Peripheral Vascular Disease  Normal cardiovascular exam Rhythm:Regular Rate:Normal  Echo 05/01/2021 1. Left ventricular ejection fraction, by estimation, is 55 to 60%. The left ventricle has normal function. The left ventricle has no regional wall motion abnormalities. Left ventricular diastolic parameters are consistent with Grade I diastolic dysfunction (impaired relaxation).  2. Right ventricular systolic function is normal. The right ventricular size is normal. Tricuspid regurgitation signal is inadequate for assessing PA pressure.  3. Left atrial size was mildly dilated.  4. The mitral valve is normal in structure. Trivial mitral valve regurgitation. No evidence of mitral stenosis.  5. The aortic valve is tricuspid. Aortic valve regurgitation is mild. Mild aortic valve sclerosis is present, with no evidence of aortic valve stenosis.  6. Aortic dilatation noted. There is borderline dilatation of the aortic root, measuring 37 mm.  7. The inferior vena cava is normal in size with greater than 50% respiratory variability, suggesting right atrial pressure of 3 mmHg.    Neuro/Psych Anxiety  Neuromuscular disease negative psych ROS   GI/Hepatic Neg liver ROS, GERD  ,  Endo/Other  negative endocrine ROS  Renal/GU Renal InsufficiencyRenal disease     Musculoskeletal  (+) Arthritis ,   Abdominal    Peds negative pediatric ROS (+)  Hematology  (+) Blood dyscrasia, anemia ,   Anesthesia Other Findings   Reproductive/Obstetrics negative OB ROS                            Anesthesia Physical  Anesthesia Plan  ASA: 4  Anesthesia Plan: General   Post-op Pain Management:    Induction: Intravenous  PONV Risk Score and Plan: 3 and Treatment may vary due to age or medical condition and Midazolam  Airway Management Planned: Oral ETT  Additional Equipment: Arterial line, CVP, TEE and Ultrasound Guidance Line Placement  Intra-op Plan:   Post-operative Plan: Possible Post-op intubation/ventilation  Informed Consent: I have reviewed the patients History and Physical, chart, labs and discussed the procedure including the risks, benefits and alternatives for the proposed anesthesia with the patient or authorized representative who has indicated his/her understanding and acceptance.     Dental advisory given  Plan Discussed with: CRNA  Anesthesia Plan Comments:        Anesthesia Quick Evaluation

## 2021-05-05 NOTE — Progress Notes (Signed)
Progress Note  Patient Name: DAMARIEN WELSCH Date of Encounter: 05/05/2021  [facility] Cardiologist: Fransico Him, MD   Subjective   No chest pain or dyspnea  Inpatient Medications    Scheduled Meds:  acetaminophen  650 mg Oral TID   allopurinol  300 mg Oral Daily   aspirin EC  81 mg Oral Daily   [START ON 05/06/2021] epinephrine  0-10 mcg/min Intravenous To OR   gabapentin  300 mg Oral BID   [START ON 05/06/2021] heparin-papaverine-plasmalyte irrigation   Irrigation To OR   [START ON 05/06/2021] insulin   Intravenous To OR   [START ON 05/06/2021] [place] Blood Cardioplegia vial (lidocaine/magnesium/mannitol 0.26g-4g-6.4g)   Intracoronary Once   metoprolol tartrate  12.5 mg Oral BID   pantoprazole  40 mg Oral Daily   [START ON 05/06/2021] phenylephrine  30-200 mcg/min Intravenous To OR   [START ON 05/06/2021] potassium chloride  80 mEq Other To OR   rosuvastatin  20 mg Oral Daily   [START ON 05/06/2021] tranexamic acid  15 mg/kg Intravenous To OR   [START ON 05/06/2021] tranexamic acid  2 mg/kg Intracatheter To OR   Continuous Infusions:  [START ON 05/06/2021]  ceFAZolin (ANCEF) IV     [START ON 05/06/2021]  ceFAZolin (ANCEF) IV     [START ON 05/06/2021] dexmedetomidine     [START ON 05/06/2021] heparin 30,000 units/NS 1000 mL solution for CELLSAVER     heparin 1,400 Units/hr (05/04/21 1756)   [START ON 05/06/2021] milrinone     [START ON 05/06/2021] nitroGLYCERIN     [START ON 05/06/2021] norepinephrine     [START ON 05/06/2021] tranexamic acid (CYKLOKAPRON) infusion ([facility])     [START ON 05/06/2021] vancomycin     PRN Meds: acetaminophen, docusate sodium, HYDROcodone-acetaminophen, nitroGLYCERIN, ondansetron (ZOFRAN) IV, polyethylene glycol, traMADol   Vital Signs    Vitals:   05/04/21 0839 05/04/21 1056 05/04/21 2024 05/05/21 0336  BP: 104/67 106/62 110/76 105/72  Pulse: 63 64    Resp: 18 17    Temp: 97.7 F (36.5 C) 97.6 F (36.4 C) 98.4 F (36.9 C) 97.7 F (36.5 C)  TempSrc: Oral  Oral Oral Oral  SpO2: 97% 96%    Weight:    93.1 kg  Height:        Intake/Output Summary (Last 24 hours) at 05/05/2021 0741 Last data filed at 05/05/2021 0344 Gross per 24 hour  Intake 600 ml  Output 2600 ml  Net -2000 ml   Last 3 Weights 05/05/2021 05/04/2021 05/03/2021  Weight (lbs) 205 lb 4 oz 205 lb 6.4 oz 204 lb 14.4 oz  Weight (kg) 93.1 kg 93.169 kg 92.942 kg      Telemetry    Sinus- Personally Reviewed  ECG    No new tracing this morning  Physical Exam    General: Well developed, well nourished, NAD  HEENT: OP clear, mucus membranes moist  SKIN: warm, dry. No rashes. Neuro: No focal deficits  Musculoskeletal: Muscle strength 5/5 all ext  Psychiatric: Mood and affect normal  Neck: No JVD, no carotid bruits, no thyromegaly, no lymphadenopathy.  Lungs:Clear bilaterally, no wheezes, rhonci, crackles Cardiovascular: Regular rate and rhythm. No murmurs, gallops or rubs. Abdomen:Soft. Bowel sounds present. Non-tender.  Extremities: No lower extremity edema. Pulses are 2 + in the bilateral DP/PT.   Labs    High Sensitivity Troponin:   Recent Labs  Lab 04/30/21 1236 04/30/21 1308  TROPONINIHS 5 5      Chemistry Recent Labs  Lab 04/30/21  1236 04/30/21 1311 04/30/21 1930 05/01/21 0227 05/05/21 0233  NA 140 141  --  140 140  K 4.3 4.5  --  3.9 3.8  CL 107 109  --  103 108  CO2 24  --   --  27 25  GLUCOSE 97 91  --  82 86  BUN 20 24*  --  18 8  CREATININE 1.11 1.10 1.05 1.13 0.98  CALCIUM 9.3  --   --  9.1 8.9  PROT 6.7  --   --   --   --   ALBUMIN 4.0  --   --   --   --   AST 21  --   --   --   --   ALT 16  --   --   --   --   ALKPHOS 40  --   --   --   --   BILITOT 1.0  --   --   --   --   GFRNONAA >60  --  >60 >60 >60  ANIONGAP 9  --   --  10 7     Hematology Recent Labs  Lab 05/03/21 0429 05/04/21 0316 05/05/21 0233  WBC 5.5 6.1 6.5  RBC 4.08* 4.17* 4.35  HGB 12.0* 12.6* 12.9*  HCT 37.1* 37.4* 39.8  MCV 90.9 89.7 91.5  MCH 29.4 30.2 29.7   MCHC 32.3 33.7 32.4  RDW 15.4 15.3 15.5  PLT 132* 147* 154    BNP Recent Labs  Lab 04/30/21 1256  BNP 125.7*     DDimer No results for input(s): DDIMER in the last 168 hours.   Radiology    VAS US DOPPLER PRE CABG  Result Date: 05/04/2021 PREOPERATIVE VASCULAR EVALUATION Patient Name:  MARKEE LIENEMANN  Date of Exam:   05/03/2021 Medical Rec #: [id number]      Accession #:    [id number] Date of Birth: 1940-12-15      Patient Gender: M Patient Age:   89 years Exam Location:  [facility] Procedure:      VAS US DOPPLER PRE CABG Referring Phys: HARRELL LIGHTFOOT --------------------------------------------------------------------------------  Indications:      Pre-CABG. Risk Factors:     Hypertension, hyperlipidemia, coronary artery disease. Other Factors:    History of LAD stenting. History of Bladder cancer. Comparison Study: Prior normal carotid duplex done 12/04/2014. Prior normal ABI                   done 03/19/18 Performing Technologist: Sharion Dove RVS  Examination Guidelines: A complete evaluation includes B-mode imaging, spectral Doppler, color Doppler, and power Doppler as needed of all accessible portions of each vessel. Bilateral testing is considered an integral part of a complete examination. Limited examinations for reoccurring indications may be performed as noted.  Right Carotid Findings: +----------+--------+--------+--------+------------+------------------+           PSV cm/sEDV cm/sStenosisDescribe    Comments           +----------+--------+--------+--------+------------+------------------+ CCA Prox  68      10                          intimal thickening +----------+--------+--------+--------+------------+------------------+ CCA Distal68      13              heterogenous                   +----------+--------+--------+--------+------------+------------------+ ICA Prox  66  19      1-39%   heterogenous                    +----------+--------+--------+--------+------------+------------------+ ICA Distal79      21                                             +----------+--------+--------+--------+------------+------------------+ ECA       91      11                                             +----------+--------+--------+--------+------------+------------------+ +----------+--------+-------+--------+------------+           PSV cm/sEDV cmsDescribeArm Pressure +----------+--------+-------+--------+------------+ Subclavian97                                  +----------+--------+-------+--------+------------+ +---------+--------+--+--------+-+ VertebralPSV cm/s24EDV cm/s5 +---------+--------+--+--------+-+ Left Carotid Findings: +----------+--------+--------+--------+------------+------------------+           PSV cm/sEDV cm/sStenosisDescribe    Comments           +----------+--------+--------+--------+------------+------------------+ CCA Prox  94      18                          intimal thickening +----------+--------+--------+--------+------------+------------------+ CCA Distal72      14                          intimal thickening +----------+--------+--------+--------+------------+------------------+ ICA Prox  82      26      1-39%   heterogenous                   +----------+--------+--------+--------+------------+------------------+ ICA Distal69      25                                             +----------+--------+--------+--------+------------+------------------+ ECA       62      14                                             +----------+--------+--------+--------+------------+------------------+ +----------+--------+--------+--------+------------+ SubclavianPSV cm/sEDV cm/sDescribeArm Pressure +----------+--------+--------+--------+------------+           41                                   +----------+--------+--------+--------+------------+  +---------+--------+--+--------+--+ VertebralPSV cm/s53EDV cm/s11 +---------+--------+--+--------+--+  ABI Findings: +--------+------------------+-----+---------+--------+ Right   Rt Pressure (mmHg)IndexWaveform Comment  +--------+------------------+-----+---------+--------+ [id number]                    triphasic         +--------+------------------+-----+---------+--------+ PTA                            triphasic         +--------+------------------+-----+---------+--------+ DP  triphasic         +--------+------------------+-----+---------+--------+ +--------+------------------+-----+---------+--------+ Left    Lt Pressure (mmHg)IndexWaveform Comment  +--------+------------------+-----+---------+--------+ Brachial                                bandages +--------+------------------+-----+---------+--------+ PTA                            triphasic         +--------+------------------+-----+---------+--------+ DP                             triphasic         +--------+------------------+-----+---------+--------+  Right Doppler Findings: +--------+--------+-----+---------+--------+ Site    PressureIndexDoppler  Comments +--------+--------+-----+---------+--------+ [id number]          triphasic         +--------+--------+-----+---------+--------+ Radial               triphasic         +--------+--------+-----+---------+--------+ Ulnar                triphasic         +--------+--------+-----+---------+--------+  Left Doppler Findings: +--------+--------+-----+---------+--------+ Site    PressureIndexDoppler  Comments +--------+--------+-----+---------+--------+ Brachial                      bandages +--------+--------+-----+---------+--------+ Radial               triphasic         +--------+--------+-----+---------+--------+ Ulnar                triphasic          +--------+--------+-----+---------+--------+  Summary: Right Carotid: Velocities in the right ICA are consistent with a 1-39% stenosis. Left Carotid: Velocities in the left ICA are consistent with a 1-39% stenosis. Vertebrals:  Bilateral vertebral arteries demonstrate antegrade flow. Subclavians: Normal flow hemodynamics were seen in bilateral subclavian              arteries. Right Upper Extremity: Doppler waveform obliterate with right radial compression. Doppler waveform obliterate with right ulnar compression. Left Upper Extremity: Doppler waveforms remain within normal limits with left radial compression. Doppler waveforms decrease 50% with left ulnar compression.  Electronically signed by Jamelle Haring on 05/04/2021 at 2:05:09 PM.    Final     Cardiac Studies   Echo 05/01/21:  1. Left ventricular ejection fraction, by estimation, is 55 to 60%. The  left ventricle has normal function. The left ventricle has no regional  wall motion abnormalities. Left ventricular diastolic parameters are  consistent with Grade I diastolic  dysfunction (impaired relaxation).   2. Right ventricular systolic function is normal. The right ventricular  size is normal. Tricuspid regurgitation signal is inadequate for assessing  PA pressure.   3. Left atrial size was mildly dilated.   4. The mitral valve is normal in structure. Trivial mitral valve  regurgitation. No evidence of mitral stenosis.   5. The aortic valve is tricuspid. Aortic valve regurgitation is mild.  Mild aortic valve sclerosis is present, with no evidence of aortic valve  stenosis.   6. Aortic dilatation noted. There is borderline dilatation of the aortic  root, measuring 37 mm.   7. The inferior vena cava is normal in size with greater than 50%  respiratory variability, suggesting right atrial pressure of 3 mmHg.   Cardiac  catheterization (05/01/2021)   Conclusion       Prox RCA-1 lesion is 50% stenosed.   Prox RCA-2 lesion is 50% stenosed.    Mid RCA lesion is 100% stenosed.   Prox Cx lesion is 30% stenosed.   Mid Cx to Dist Cx lesion is 50% stenosed.   Mid LAD-2 lesion is 20% stenosed.   Mid LAD-1 lesion is 99% stenosed.   Dist LAD lesion is 30% stenosed.   The left ventricular systolic function is normal.   LV end diastolic pressure is normal.   The left ventricular ejection fraction is 55-65% by visual estimate.   There is no mitral valve regurgitation.   Severe heavily calcified mid LAD stenosis. Patent mid LAD stent with minimal restenosis. Mild to moderate Circumflex stenosis Chronic occlusion mid RCA. The distal branches fill briskly from left to right collaterals Preserved LV systolic function Unsuccessful attempt at PCI of the mid LAD lesion as I was unable to cross the lesion with a wire.   Recommendations: Will consult CT surgery for CABG as PCI was not successful despite a long attempt at crossing the lesion. He was loaded with Plavix 600 mg this am in anticipation of PCI. He will need Plavix washout before surgery. Coronary Diagrams     Diagnostic Dominance: Right       Patient Profile     80 y.o. male with a history of CAD s/p LAD stent and occluded RCA with collaterals, hypertension, hyperlipidemia, chronic pain syndrome, bladder cancer s/p TURBT/chemotherapy/radical cystectomy and lymphadenectomy 12/13/19, gout, who was admitted with unstable angina, dyspnea, fatigue and syncope. Cardiac cath with severe three vessel CAD.    Assessment & Plan    Unstable angina: Cardiac cath with three vessel CAD. He was loaded with Plavix before attempted PCI but we were unable to cross the mid LAD lesion. CT surgery has been consulted for CABG which is planned for 05/06/21 after Plavix washout.  -Continue ASA, statin, beta blocker and IV heparin.   HTN: BP controlled. No changes  HLD: LDL at goal. Continue statin.   History of bladder cancer: No acute issue at this time, follow-up with urology as scheduled   Gout  prophylaxis: continued on home medicine allopurinol   GERD: on PPI  Constipation: Miralax this am then will try Lactulose if no BM by noon.   For questions or updates, please contact [place] Please consult [email or web address] for contact info under        Signed, Lauree Chandler, MD  05/05/2021, 7:42 AM

## 2021-05-05 NOTE — Progress Notes (Signed)
ANTICOAGULATION CONSULT NOTE - Follow Up Consult  Pharmacy Consult for heparin Indication: chest pain/ACS  No Known Allergies  Patient Measurements: Height: '6\' 2"'$  (188 cm) Weight: 93.1 kg (205 lb 4 oz) IBW/kg (Calculated) : 82.2 Heparin Dosing Weight: 92.4kg  Vital Signs: Temp: 97.7 F (36.5 C) (08/08 0336) Temp Source: Oral (08/08 0336) BP: 105/72 (08/08 0336)  Labs: Recent Labs    05/03/21 0429 05/03/21 1500 05/04/21 0316 05/05/21 0233  HGB 12.0*  --  12.6* 12.9*  HCT 37.1*  --  37.4* 39.8  PLT 132*  --  147* 154  HEPARINUNFRC  --  0.63 0.61 0.62  CREATININE  --   --   --  0.98     Estimated Creatinine Clearance: 72.2 mL/min (by C-G formula based on SCr of 0.98 mg/dL).  Assessment: 80 yo male presented with chest pain and worsening fatigue and dyspnea on exertion. Of note, he has a history of CAD and received a DES to mid LAD in 2016. He underwent cath 8/4 that showed multivessel disease with plan for CABG on 8/9 -heparin at goal on 1400 units/hr   Goal of Therapy:  Heparin level 0.3-0.7 units/ml Monitor platelets by anticoagulation protocol: Yes   Plan:  Continue heparin 1400 units/hr Daily heparin level and CBC   Thank you for including pharmacy in the care of this patient.  Hildred Laser, PharmD Clinical Pharmacist **Pharmacist phone directory can now be found on [email or web address] ([email or web address]).  Listed under [place].

## 2021-05-05 NOTE — Progress Notes (Signed)
      [address]       ,[place] [address]             [phone]      4 Days Post-Op Procedure(s) (LRB): LEFT HEART CATH AND CORONARY ANGIOGRAPHY (N/A) Subjective: Feels okay this morning, no additional questions about surgery at this time.  Objective: Vital signs in last 24 hours: Temp:  [97.6 F (36.4 C)-98.4 F (36.9 C)] 97.7 F (36.5 C) (08/08 0336) Pulse Rate:  [63-64] 64 (08/07 1056) Cardiac Rhythm: Normal sinus rhythm (08/07 1900) Resp:  [17-18] 17 (08/07 1056) BP: (104-110)/(62-76) 105/72 (08/08 0336) SpO2:  [96 %-97 %] 96 % (08/07 1056) Weight:  [93.1 kg] 93.1 kg (08/08 0336)     Intake/Output from previous day: 08/07 0701 - 08/08 0700 In: 600 [P.O.:600] Out: 2600 [Urine:2600] Intake/Output this shift: No intake/output data recorded.  General appearance: alert, cooperative, and no distress Heart: regular rate and rhythm, S1, S2 normal, no murmur, click, rub or gallop Lungs: clear to auscultation bilaterally Abdomen: soft, non-tender; bowel sounds normal; no masses,  no organomegaly Extremities: extremities normal, atraumatic, no cyanosis or edema  Lab Results: Recent Labs    05/04/21 0316 05/05/21 0233  WBC 6.1 6.5  HGB 12.6* 12.9*  HCT 37.4* 39.8  PLT 147* 154   BMET:  Recent Labs    05/05/21 0233  NA 140  K 3.8  CL 108  CO2 25  GLUCOSE 86  BUN 8  CREATININE 0.98  CALCIUM 8.9    PT/INR: No results for input(s): LABPROT, INR in the last 72 hours. ABG    Component Value Date/Time   TCO2 25 04/30/2021 1311   CBG (last 3)  No results for input(s): GLUCAP in the last 72 hours.  Assessment/Plan: S/P Procedure(s) (LRB): LEFT HEART CATH AND CORONARY ANGIOGRAPHY (N/A)  CAD- plan for CABG tomorrow. All questions answered.  Carotids without significant disease  Constipation-colace ordered   Plan: CABG tomorrow.    LOS: 4 days    Elgie Collard 05/05/2021

## 2021-05-05 NOTE — Progress Notes (Signed)
[id number] Came to see pt to see if any questions re ed done pre op . Pt encouraged to use IS. Offered to walk. Pt stated he has been up in room and bathing and resting now. Will follow up after surgery. Graylon Good RN BSN 05/05/2021 1:31 PM

## 2021-05-05 NOTE — Progress Notes (Signed)
Patient is alert and oriented x4. Patient has read through the inform consent and blood consent and has signed for both procedure(s)/consents. RN has signed as witness. Consents are in the chart.

## 2021-05-06 ENCOUNTER — Inpatient Hospital Stay (HOSPITAL_COMMUNITY): Payer: Medicare HMO

## 2021-05-06 ENCOUNTER — Inpatient Hospital Stay (HOSPITAL_COMMUNITY)
Admission: EM | Disposition: A | Discharge status: Home / Self Care | Payer: Self-pay | Source: Home / Self Care | Attending: Cardiovascular Disease

## 2021-05-06 ENCOUNTER — Inpatient Hospital Stay (HOSPITAL_COMMUNITY): Payer: Medicare HMO | Admitting: Certified Registered Nurse Anesthetist

## 2021-05-06 DIAGNOSIS — I2510 Atherosclerotic heart disease of native coronary artery without angina pectoris: Secondary | ICD-10-CM

## 2021-05-06 DIAGNOSIS — Z951 Presence of aortocoronary bypass graft: Secondary | ICD-10-CM

## 2021-05-06 HISTORY — PX: CORONARY ARTERY BYPASS GRAFT: SHX141

## 2021-05-06 HISTORY — PX: TEE WITHOUT CARDIOVERSION: SHX5443

## 2021-05-06 LAB — POCT I-STAT, CHEM 8
BUN: 8 mg/dL (ref 8–23)
BUN: 8 mg/dL (ref 8–23)
BUN: 8 mg/dL (ref 8–23)
Calcium, Ion: 1.33 mmol/L (ref 1.15–1.40)
Calcium, Ion: 1.28 mmol/L (ref 1.15–1.40)
Calcium, Ion: 1.28 mmol/L (ref 1.15–1.40)
Chloride: 105 mmol/L (ref 98–111)
Chloride: 104 mmol/L (ref 98–111)
Chloride: 105 mmol/L (ref 98–111)
Creatinine, Ser: 0.8 mg/dL (ref 0.61–1.24)
Creatinine, Ser: 0.8 mg/dL (ref 0.61–1.24)
Creatinine, Ser: 0.8 mg/dL (ref 0.61–1.24)
Glucose, Bld: 107 mg/dL — ABNORMAL HIGH (ref 70–99)
Glucose, Bld: 113 mg/dL — ABNORMAL HIGH (ref 70–99)
Glucose, Bld: 142 mg/dL — ABNORMAL HIGH (ref 70–99)
HCT: 37 % — ABNORMAL LOW (ref 39.0–52.0)
HCT: 34 % — ABNORMAL LOW (ref 39.0–52.0)
HCT: 35 % — ABNORMAL LOW (ref 39.0–52.0)
Hemoglobin: 12.6 g/dL — ABNORMAL LOW (ref 13.0–17.0)
Hemoglobin: 11.6 g/dL — ABNORMAL LOW (ref 13.0–17.0)
Hemoglobin: 11.9 g/dL — ABNORMAL LOW (ref 13.0–17.0)
Potassium: 3.7 mmol/L (ref 3.5–5.1)
Potassium: 3.9 mmol/L (ref 3.5–5.1)
Potassium: 3.7 mmol/L (ref 3.5–5.1)
Sodium: 142 mmol/L (ref 135–145)
Sodium: 141 mmol/L (ref 135–145)
Sodium: 142 mmol/L (ref 135–145)
TCO2: 28 mmol/L (ref 22–32)
TCO2: 25 mmol/L (ref 22–32)
TCO2: 25 mmol/L (ref 22–32)

## 2021-05-06 LAB — POCT I-STAT 7, (LYTES, BLD GAS, ICA,H+H)
Acid-Base Excess: 1 mmol/L (ref 0.0–2.0)
Acid-base deficit: 1 mmol/L (ref 0.0–2.0)
Acid-base deficit: 3 mmol/L — ABNORMAL HIGH (ref 0.0–2.0)
Acid-base deficit: 6 mmol/L — ABNORMAL HIGH (ref 0.0–2.0)
Acid-base deficit: 4 mmol/L — ABNORMAL HIGH (ref 0.0–2.0)
Bicarbonate: 27.3 mmol/L (ref 20.0–28.0)
Bicarbonate: 25.3 mmol/L (ref 20.0–28.0)
Bicarbonate: 22.4 mmol/L (ref 20.0–28.0)
Bicarbonate: 19.9 mmol/L — ABNORMAL LOW (ref 20.0–28.0)
Bicarbonate: 21.1 mmol/L (ref 20.0–28.0)
Calcium, Ion: 1.29 mmol/L (ref 1.15–1.40)
Calcium, Ion: 1.24 mmol/L (ref 1.15–1.40)
Calcium, Ion: 1.19 mmol/L (ref 1.15–1.40)
Calcium, Ion: 1.23 mmol/L (ref 1.15–1.40)
Calcium, Ion: 1.17 mmol/L (ref 1.15–1.40)
HCT: 38 % — ABNORMAL LOW (ref 39.0–52.0)
HCT: 36 % — ABNORMAL LOW (ref 39.0–52.0)
HCT: 34 % — ABNORMAL LOW (ref 39.0–52.0)
HCT: 33 % — ABNORMAL LOW (ref 39.0–52.0)
HCT: 31 % — ABNORMAL LOW (ref 39.0–52.0)
Hemoglobin: 12.9 g/dL — ABNORMAL LOW (ref 13.0–17.0)
Hemoglobin: 12.2 g/dL — ABNORMAL LOW (ref 13.0–17.0)
Hemoglobin: 11.6 g/dL — ABNORMAL LOW (ref 13.0–17.0)
Hemoglobin: 11.2 g/dL — ABNORMAL LOW (ref 13.0–17.0)
Hemoglobin: 10.5 g/dL — ABNORMAL LOW (ref 13.0–17.0)
O2 Saturation: 100 %
O2 Saturation: 99 %
O2 Saturation: 99 %
O2 Saturation: 97 %
O2 Saturation: 97 %
Patient temperature: 96.2
Patient temperature: 96.9
Patient temperature: 97.9
Potassium: 3.7 mmol/L (ref 3.5–5.1)
Potassium: 3.8 mmol/L (ref 3.5–5.1)
Potassium: 3.7 mmol/L (ref 3.5–5.1)
Potassium: 4.1 mmol/L (ref 3.5–5.1)
Potassium: 4 mmol/L (ref 3.5–5.1)
Sodium: 142 mmol/L (ref 135–145)
Sodium: 142 mmol/L (ref 135–145)
Sodium: 142 mmol/L (ref 135–145)
Sodium: 141 mmol/L (ref 135–145)
Sodium: 142 mmol/L (ref 135–145)
TCO2: 29 mmol/L (ref 22–32)
TCO2: 27 mmol/L (ref 22–32)
TCO2: 24 mmol/L (ref 22–32)
TCO2: 21 mmol/L — ABNORMAL LOW (ref 22–32)
TCO2: 22 mmol/L (ref 22–32)
pCO2 arterial: 47.4 mmHg (ref 32.0–48.0)
pCO2 arterial: 46.2 mmHg (ref 32.0–48.0)
pCO2 arterial: 38.9 mmHg (ref 32.0–48.0)
pCO2 arterial: 39 mmHg (ref 32.0–48.0)
pCO2 arterial: 36.7 mmHg (ref 32.0–48.0)
pH, Arterial: 7.369 (ref 7.350–7.450)
pH, Arterial: 7.345 — ABNORMAL LOW (ref 7.350–7.450)
pH, Arterial: 7.362 (ref 7.350–7.450)
pH, Arterial: 7.312 — ABNORMAL LOW (ref 7.350–7.450)
pH, Arterial: 7.365 (ref 7.350–7.450)
pO2, Arterial: 399 mmHg — ABNORMAL HIGH (ref 83.0–108.0)
pO2, Arterial: 150 mmHg — ABNORMAL HIGH (ref 83.0–108.0)
pO2, Arterial: 159 mmHg — ABNORMAL HIGH (ref 83.0–108.0)
pO2, Arterial: 96 mmHg (ref 83.0–108.0)
pO2, Arterial: 87 mmHg (ref 83.0–108.0)

## 2021-05-06 LAB — ECHO INTRAOPERATIVE TEE
Height: 74 in
Weight: 3245.17 oz

## 2021-05-06 LAB — GLUCOSE, CAPILLARY
Glucose-Capillary: 135 mg/dL — ABNORMAL HIGH (ref 70–99)
Glucose-Capillary: 124 mg/dL — ABNORMAL HIGH (ref 70–99)
Glucose-Capillary: 132 mg/dL — ABNORMAL HIGH (ref 70–99)
Glucose-Capillary: 127 mg/dL — ABNORMAL HIGH (ref 70–99)
Glucose-Capillary: 128 mg/dL — ABNORMAL HIGH (ref 70–99)
Glucose-Capillary: 119 mg/dL — ABNORMAL HIGH (ref 70–99)
Glucose-Capillary: 126 mg/dL — ABNORMAL HIGH (ref 70–99)
Glucose-Capillary: 143 mg/dL — ABNORMAL HIGH (ref 70–99)
Glucose-Capillary: 155 mg/dL — ABNORMAL HIGH (ref 70–99)
Glucose-Capillary: 138 mg/dL — ABNORMAL HIGH (ref 70–99)

## 2021-05-06 LAB — BASIC METABOLIC PANEL
Anion gap: 8 (ref 5–15)
Anion gap: 7 (ref 5–15)
BUN: 8 mg/dL (ref 8–23)
BUN: 8 mg/dL (ref 8–23)
CO2: 25 mmol/L (ref 22–32)
CO2: 22 mmol/L (ref 22–32)
Calcium: 9 mg/dL (ref 8.9–10.3)
Calcium: 8.4 mg/dL — ABNORMAL LOW (ref 8.9–10.3)
Chloride: 106 mmol/L (ref 98–111)
Chloride: 109 mmol/L (ref 98–111)
Creatinine, Ser: 0.89 mg/dL (ref 0.61–1.24)
Creatinine, Ser: 0.92 mg/dL (ref 0.61–1.24)
GFR, Estimated: >60 mL/min (ref >60)
GFR, Estimated: >60 mL/min (ref >60)
Glucose, Bld: 96 mg/dL (ref 70–99)
Glucose, Bld: 136 mg/dL — ABNORMAL HIGH (ref 70–99)
Potassium: 3.8 mmol/L (ref 3.5–5.1)
Potassium: 4.1 mmol/L (ref 3.5–5.1)
Sodium: 139 mmol/L (ref 135–145)
Sodium: 138 mmol/L (ref 135–145)

## 2021-05-06 LAB — PROTIME-INR
INR: 1 (ref 0.8–1.2)
INR: 1.2 (ref 0.8–1.2)
Prothrombin Time: 13.2 seconds (ref 11.4–15.2)
Prothrombin Time: 14.9 seconds (ref 11.4–15.2)

## 2021-05-06 LAB — CBC
HCT: 38.6 % — ABNORMAL LOW (ref 39.0–52.0)
HCT: 36.5 % — ABNORMAL LOW (ref 39.0–52.0)
HCT: 33.7 % — ABNORMAL LOW (ref 39.0–52.0)
Hemoglobin: 12.8 g/dL — ABNORMAL LOW (ref 13.0–17.0)
Hemoglobin: 12.1 g/dL — ABNORMAL LOW (ref 13.0–17.0)
Hemoglobin: 11.2 g/dL — ABNORMAL LOW (ref 13.0–17.0)
MCH: 30 pg (ref 26.0–34.0)
MCH: 30 pg (ref 26.0–34.0)
MCH: 30.1 pg (ref 26.0–34.0)
MCHC: 33.2 g/dL (ref 30.0–36.0)
MCHC: 33.2 g/dL (ref 30.0–36.0)
MCHC: 33.2 g/dL (ref 30.0–36.0)
MCV: 90.4 fL (ref 80.0–100.0)
MCV: 90.6 fL (ref 80.0–100.0)
MCV: 90.6 fL (ref 80.0–100.0)
Platelets: 145 10*3/uL — ABNORMAL LOW (ref 150–400)
Platelets: 125 10*3/uL — ABNORMAL LOW (ref 150–400)
Platelets: 103 10*3/uL — ABNORMAL LOW (ref 150–400)
RBC: 4.27 MIL/uL (ref 4.22–5.81)
RBC: 4.03 MIL/uL — ABNORMAL LOW (ref 4.22–5.81)
RBC: 3.72 MIL/uL — ABNORMAL LOW (ref 4.22–5.81)
RDW: 15.7 % — ABNORMAL HIGH (ref 11.5–15.5)
RDW: 15.2 % (ref 11.5–15.5)
RDW: 15 % (ref 11.5–15.5)
WBC: 7 10*3/uL (ref 4.0–10.5)
WBC: 11.4 10*3/uL — ABNORMAL HIGH (ref 4.0–10.5)
WBC: 8.2 10*3/uL (ref 4.0–10.5)
nRBC: 0 % (ref 0.0–0.2)
nRBC: 0 % (ref 0.0–0.2)
nRBC: 0 % (ref 0.0–0.2)

## 2021-05-06 LAB — APTT
aPTT: 103 seconds — ABNORMAL HIGH (ref 24–36)
aPTT: 30 seconds (ref 24–36)

## 2021-05-06 LAB — MAGNESIUM: Magnesium: 2.4 mg/dL (ref 1.7–2.4)

## 2021-05-06 LAB — HEMOGLOBIN A1C
Hgb A1c MFr Bld: 5.7 % — ABNORMAL HIGH (ref 4.8–5.6)
Mean Plasma Glucose: 116.89 mg/dL

## 2021-05-06 LAB — HEPARIN LEVEL (UNFRACTIONATED): Heparin Unfractionated: 0.54 IU/mL (ref 0.30–0.70)

## 2021-05-06 SURGERY — OFF PUMP CORONARY ARTERY BYPASS GRAFTING (CABG)
Anesthesia: General | Site: Chest

## 2021-05-06 MED ORDER — LACTATED RINGERS IV SOLN: INTRAVENOUS | Status: DC | PRN

## 2021-05-06 MED ORDER — SODIUM CHLORIDE 0.9% FLUSH: 3.0000 mL | INTRAVENOUS | Status: DC | PRN

## 2021-05-06 MED ORDER — SODIUM CHLORIDE 0.9 % IV SOLN: 250.0000 mL | INTRAVENOUS | Status: DC

## 2021-05-06 MED ORDER — VANCOMYCIN HCL IN DEXTROSE 1-5 GM/200ML-% IV SOLN
1000.0000 mg | Freq: Once | INTRAVENOUS | Status: AC
  Administered 2021-05-06: 1000 mg via INTRAVENOUS
  Filled 2021-05-06: qty 200

## 2021-05-06 MED ORDER — ARTIFICIAL TEARS OPHTHALMIC OINT
TOPICAL_OINTMENT | OPHTHALMIC | Status: AC
  Filled 2021-05-06: qty 3.5

## 2021-05-06 MED ORDER — MIDAZOLAM HCL 2 MG/2ML IJ SOLN: 2.0000 mg | INTRAMUSCULAR | Status: DC | PRN

## 2021-05-06 MED ORDER — MORPHINE SULFATE (PF) 2 MG/ML IV SOLN
1.0000 mg | INTRAVENOUS | Status: DC | PRN
  Administered 2021-05-06: 4 mg via INTRAVENOUS
  Administered 2021-05-06 – 2021-05-07 (×2): 2 mg via INTRAVENOUS
  Filled 2021-05-06: qty 1
  Filled 2021-05-06: qty 2
  Filled 2021-05-06: qty 1

## 2021-05-06 MED ORDER — PROTAMINE SULFATE 10 MG/ML IV SOLN
INTRAVENOUS | Status: DC | PRN
  Administered 2021-05-06: 150 mg via INTRAVENOUS

## 2021-05-06 MED ORDER — NOREPINEPHRINE 4 MG/250ML-% IV SOLN
INTRAVENOUS | Status: DC | PRN
  Administered 2021-05-06: 2 ug/min via INTRAVENOUS

## 2021-05-06 MED ORDER — SODIUM BICARBONATE 8.4 % IV SOLN
50.0000 meq | Freq: Once | INTRAVENOUS | Status: AC
  Administered 2021-05-06: 50 meq via INTRAVENOUS

## 2021-05-06 MED ORDER — CHLORHEXIDINE GLUCONATE 0.12 % MT SOLN
15.0000 mL | OROMUCOSAL | Status: AC
  Administered 2021-05-06: 15 mL via OROMUCOSAL

## 2021-05-06 MED ORDER — BISACODYL 10 MG RE SUPP: 10.0000 mg | Freq: Every day | RECTAL | Status: DC

## 2021-05-06 MED ORDER — METOPROLOL TARTRATE 25 MG/10 ML ORAL SUSPENSION
12.5000 mg | Freq: Two times a day (BID) | ORAL | Status: DC
  Filled 2021-05-06: qty 5

## 2021-05-06 MED ORDER — DOCUSATE SODIUM 100 MG PO CAPS
200.0000 mg | ORAL_CAPSULE | Freq: Every day | ORAL | Status: DC
  Administered 2021-05-07 – 2021-05-11 (×5): 200 mg via ORAL
  Filled 2021-05-06 (×5): qty 2

## 2021-05-06 MED ORDER — ROCURONIUM BROMIDE 10 MG/ML (PF) SYRINGE
PREFILLED_SYRINGE | INTRAVENOUS | Status: AC
  Filled 2021-05-06: qty 10

## 2021-05-06 MED ORDER — FAMOTIDINE IN NACL 20-0.9 MG/50ML-% IV SOLN
20.0000 mg | Freq: Two times a day (BID) | INTRAVENOUS | Status: AC
  Administered 2021-05-06: 20 mg via INTRAVENOUS
  Filled 2021-05-06: qty 50

## 2021-05-06 MED ORDER — PHENYLEPHRINE 40 MCG/ML (10ML) SYRINGE FOR IV PUSH (FOR BLOOD PRESSURE SUPPORT)
PREFILLED_SYRINGE | INTRAVENOUS | Status: DC | PRN
Start: 2021-05-06 — End: 2021-05-06
  Administered 2021-05-06 (×2): 80 ug via INTRAVENOUS
  Administered 2021-05-06 (×2): 120 ug via INTRAVENOUS
  Administered 2021-05-06: 40 ug via INTRAVENOUS
  Administered 2021-05-06: 120 ug via INTRAVENOUS
  Administered 2021-05-06: 80 ug via INTRAVENOUS
  Administered 2021-05-06 (×2): 40 ug via INTRAVENOUS
  Administered 2021-05-06 (×4): 80 ug via INTRAVENOUS
  Administered 2021-05-06: 120 ug via INTRAVENOUS

## 2021-05-06 MED ORDER — PANTOPRAZOLE SODIUM 40 MG PO TBEC: 40.0000 mg | DELAYED_RELEASE_TABLET | Freq: Every day | ORAL | Status: DC

## 2021-05-06 MED ORDER — FENTANYL CITRATE (PF) 250 MCG/5ML IJ SOLN
INTRAMUSCULAR | Status: AC
  Filled 2021-05-06: qty 25

## 2021-05-06 MED ORDER — PHENYLEPHRINE HCL-NACL 20-0.9 MG/250ML-% IV SOLN
INTRAVENOUS | Status: DC | PRN
  Administered 2021-05-06: 15 ug/min via INTRAVENOUS

## 2021-05-06 MED ORDER — FENTANYL CITRATE (PF) 100 MCG/2ML IJ SOLN
INTRAMUSCULAR | Status: DC | PRN
  Administered 2021-05-06 (×3): 50 ug via INTRAVENOUS
  Administered 2021-05-06 (×2): 100 ug via INTRAVENOUS
  Administered 2021-05-06: 150 ug via INTRAVENOUS
  Administered 2021-05-06: 300 ug via INTRAVENOUS
  Administered 2021-05-06: 25 ug via INTRAVENOUS
  Administered 2021-05-06 (×2): 100 ug via INTRAVENOUS
  Administered 2021-05-06: 25 ug via INTRAVENOUS
  Administered 2021-05-06: 150 ug via INTRAVENOUS
  Administered 2021-05-06: 50 ug via INTRAVENOUS

## 2021-05-06 MED ORDER — ACETAMINOPHEN 160 MG/5ML PO SOLN: 650.0000 mg | Freq: Once | ORAL | Status: AC

## 2021-05-06 MED ORDER — LACTATED RINGERS IV SOLN: INTRAVENOUS | Status: DC

## 2021-05-06 MED ORDER — MIDAZOLAM HCL 5 MG/5ML IJ SOLN
INTRAMUSCULAR | Status: DC | PRN
  Administered 2021-05-06: 3 mg via INTRAVENOUS
  Administered 2021-05-06: 2 mg via INTRAVENOUS
  Administered 2021-05-06: 3 mg via INTRAVENOUS
  Administered 2021-05-06: 2 mg via INTRAVENOUS

## 2021-05-06 MED ORDER — PROTAMINE SULFATE 10 MG/ML IV SOLN
INTRAVENOUS | Status: AC
  Filled 2021-05-06: qty 25

## 2021-05-06 MED ORDER — NITROGLYCERIN IN D5W 200-5 MCG/ML-% IV SOLN: 0.0000 ug/min | INTRAVENOUS | Status: DC

## 2021-05-06 MED ORDER — POTASSIUM CHLORIDE 10 MEQ/50ML IV SOLN
10.0000 meq | INTRAVENOUS | Status: AC
  Administered 2021-05-06 (×3): 10 meq via INTRAVENOUS

## 2021-05-06 MED ORDER — LIDOCAINE 2% (20 MG/ML) 5 ML SYRINGE
INTRAMUSCULAR | Status: AC
  Filled 2021-05-06: qty 5

## 2021-05-06 MED ORDER — INSULIN REGULAR(HUMAN) IN NACL 100-0.9 UT/100ML-% IV SOLN: INTRAVENOUS | Status: DC

## 2021-05-06 MED ORDER — LACTATED RINGERS IV SOLN
500.0000 mL | Freq: Once | INTRAVENOUS | Status: AC | PRN
  Administered 2021-05-07: 500 mL via INTRAVENOUS

## 2021-05-06 MED ORDER — CHLORHEXIDINE GLUCONATE CLOTH 2 % EX PADS
6.0000 | MEDICATED_PAD | Freq: Every day | CUTANEOUS | Status: DC
  Administered 2021-05-06 – 2021-05-11 (×6): 6 via TOPICAL

## 2021-05-06 MED ORDER — PHENYLEPHRINE 40 MCG/ML (10ML) SYRINGE FOR IV PUSH (FOR BLOOD PRESSURE SUPPORT)
PREFILLED_SYRINGE | INTRAVENOUS | Status: AC
  Filled 2021-05-06: qty 10

## 2021-05-06 MED ORDER — SODIUM CHLORIDE 0.9% FLUSH
3.0000 mL | Freq: Two times a day (BID) | INTRAVENOUS | Status: DC
  Administered 2021-05-07 – 2021-05-10 (×5): 3 mL via INTRAVENOUS

## 2021-05-06 MED ORDER — ACETAMINOPHEN 500 MG PO TABS
1000.0000 mg | ORAL_TABLET | Freq: Four times a day (QID) | ORAL | Status: DC
  Administered 2021-05-06 – 2021-05-11 (×19): 1000 mg via ORAL
  Filled 2021-05-06 (×20): qty 2

## 2021-05-06 MED ORDER — ONDANSETRON HCL 4 MG/2ML IJ SOLN
4.0000 mg | Freq: Four times a day (QID) | INTRAMUSCULAR | Status: DC | PRN
  Administered 2021-05-07: 4 mg via INTRAVENOUS
  Filled 2021-05-06: qty 2

## 2021-05-06 MED ORDER — ROCURONIUM BROMIDE 10 MG/ML (PF) SYRINGE
PREFILLED_SYRINGE | INTRAVENOUS | Status: DC | PRN
Start: 2021-05-06 — End: 2021-05-06
  Administered 2021-05-06: 100 mg via INTRAVENOUS
  Administered 2021-05-06: 50 mg via INTRAVENOUS

## 2021-05-06 MED ORDER — PHENYLEPHRINE 40 MCG/ML (10ML) SYRINGE FOR IV PUSH (FOR BLOOD PRESSURE SUPPORT)
PREFILLED_SYRINGE | INTRAVENOUS | Status: AC
  Filled 2021-05-06: qty 20

## 2021-05-06 MED ORDER — ARTIFICIAL TEARS OPHTHALMIC OINT
TOPICAL_OINTMENT | OPHTHALMIC | Status: DC | PRN
  Administered 2021-05-06: 1 via OPHTHALMIC

## 2021-05-06 MED ORDER — METOPROLOL TARTRATE 12.5 MG HALF TABLET
12.5000 mg | ORAL_TABLET | Freq: Two times a day (BID) | ORAL | Status: DC
  Administered 2021-05-06 – 2021-05-10 (×4): 12.5 mg via ORAL
  Filled 2021-05-06 (×6): qty 1

## 2021-05-06 MED ORDER — 0.9 % SODIUM CHLORIDE (POUR BTL) OPTIME
TOPICAL | Status: DC | PRN
  Administered 2021-05-06: 5000 mL

## 2021-05-06 MED ORDER — INSULIN REGULAR(HUMAN) IN NACL 100-0.9 UT/100ML-% IV SOLN
INTRAVENOUS | Status: DC | PRN
  Administered 2021-05-06: 1 [IU]/h via INTRAVENOUS

## 2021-05-06 MED ORDER — ACETAMINOPHEN 650 MG RE SUPP
650.0000 mg | Freq: Once | RECTAL | Status: AC
  Administered 2021-05-06: 650 mg via RECTAL

## 2021-05-06 MED ORDER — PROPOFOL 10 MG/ML IV BOLUS
INTRAVENOUS | Status: DC | PRN
Start: 2021-05-06 — End: 2021-05-06
  Administered 2021-05-06: 50 mg via INTRAVENOUS
  Administered 2021-05-06: 60 mg via INTRAVENOUS

## 2021-05-06 MED ORDER — DOBUTAMINE IN D5W 4-5 MG/ML-% IV SOLN: 0.0000 ug/kg/min | INTRAVENOUS | Status: DC

## 2021-05-06 MED ORDER — CEFAZOLIN SODIUM-DEXTROSE 2-4 GM/100ML-% IV SOLN
2.0000 g | Freq: Three times a day (TID) | INTRAVENOUS | Status: AC
  Administered 2021-05-06 – 2021-05-08 (×6): 2 g via INTRAVENOUS
  Filled 2021-05-06 (×7): qty 100

## 2021-05-06 MED ORDER — SODIUM CHLORIDE 0.45 % IV SOLN: INTRAVENOUS | Status: DC | PRN

## 2021-05-06 MED ORDER — MIDAZOLAM HCL (PF) 10 MG/2ML IJ SOLN
INTRAMUSCULAR | Status: AC
  Filled 2021-05-06: qty 2

## 2021-05-06 MED ORDER — PHENYLEPHRINE HCL (PRESSORS) 10 MG/ML IV SOLN: INTRAVENOUS | Status: DC | PRN

## 2021-05-06 MED ORDER — ALBUMIN HUMAN 5 % IV SOLN
250.0000 mL | INTRAVENOUS | Status: AC | PRN
Start: 2021-05-06 — End: 2021-05-06
  Administered 2021-05-06 (×4): 12.5 g via INTRAVENOUS
  Filled 2021-05-06 (×3): qty 250

## 2021-05-06 MED ORDER — NOREPINEPHRINE 4 MG/250ML-% IV SOLN
0.0000 ug/min | INTRAVENOUS | Status: DC
  Administered 2021-05-06: 4 ug/min via INTRAVENOUS
  Administered 2021-05-07: 7 ug/min via INTRAVENOUS
  Filled 2021-05-06: qty 250

## 2021-05-06 MED ORDER — PLASMA-LYTE A IV SOLN
INTRAVENOUS | Status: DC | PRN
  Administered 2021-05-06: 1000 mL via INTRAVASCULAR

## 2021-05-06 MED ORDER — ALBUMIN HUMAN 5 % IV SOLN: INTRAVENOUS | Status: DC | PRN

## 2021-05-06 MED ORDER — MAGNESIUM SULFATE 4 GM/100ML IV SOLN
4.0000 g | Freq: Once | INTRAVENOUS | Status: AC
  Administered 2021-05-06: 4 g via INTRAVENOUS
  Filled 2021-05-06: qty 100

## 2021-05-06 MED ORDER — SODIUM CHLORIDE 0.9 % IV SOLN: INTRAVENOUS | Status: DC

## 2021-05-06 MED ORDER — PROPOFOL 10 MG/ML IV BOLUS
INTRAVENOUS | Status: AC
  Filled 2021-05-06: qty 20

## 2021-05-06 MED ORDER — SODIUM CHLORIDE (PF) 0.9 % IJ SOLN
INTRAMUSCULAR | Status: AC
  Filled 2021-05-06: qty 10

## 2021-05-06 MED ORDER — TRAMADOL HCL 50 MG PO TABS
50.0000 mg | ORAL_TABLET | ORAL | Status: DC | PRN
  Administered 2021-05-07 – 2021-05-08 (×3): 100 mg via ORAL
  Administered 2021-05-09: 50 mg via ORAL
  Administered 2021-05-09 – 2021-05-11 (×5): 100 mg via ORAL
  Filled 2021-05-06 (×3): qty 2
  Filled 2021-05-06: qty 1
  Filled 2021-05-06 (×5): qty 2

## 2021-05-06 MED ORDER — ROSUVASTATIN CALCIUM 20 MG PO TABS
20.0000 mg | ORAL_TABLET | Freq: Every day | ORAL | Status: DC
  Filled 2021-05-06: qty 1

## 2021-05-06 MED ORDER — NICARDIPINE HCL IN NACL 20-0.86 MG/200ML-% IV SOLN
0.0000 mg/h | INTRAVENOUS | Status: DC
  Filled 2021-05-06: qty 200

## 2021-05-06 MED ORDER — ASPIRIN 81 MG PO CHEW: 324.0000 mg | CHEWABLE_TABLET | Freq: Every day | ORAL | Status: DC

## 2021-05-06 MED ORDER — DEXMEDETOMIDINE HCL IN NACL 400 MCG/100ML IV SOLN
INTRAVENOUS | Status: DC | PRN
  Administered 2021-05-06: .4 ug/kg/h via INTRAVENOUS

## 2021-05-06 MED ORDER — ORAL CARE MOUTH RINSE
15.0000 mL | Freq: Two times a day (BID) | OROMUCOSAL | Status: DC
  Administered 2021-05-06 – 2021-05-11 (×9): 15 mL via OROMUCOSAL

## 2021-05-06 MED ORDER — HEPARIN SODIUM (PORCINE) 1000 UNIT/ML IJ SOLN
INTRAMUSCULAR | Status: DC | PRN
  Administered 2021-05-06: 15000 [IU] via INTRAVENOUS

## 2021-05-06 MED ORDER — OXYCODONE HCL 5 MG PO TABS
5.0000 mg | ORAL_TABLET | ORAL | Status: DC | PRN
  Administered 2021-05-06 – 2021-05-11 (×13): 10 mg via ORAL
  Filled 2021-05-06 (×13): qty 2

## 2021-05-06 MED ORDER — ASPIRIN EC 325 MG PO TBEC
325.0000 mg | DELAYED_RELEASE_TABLET | Freq: Every day | ORAL | Status: DC
  Filled 2021-05-06: qty 1

## 2021-05-06 MED ORDER — METOPROLOL TARTRATE 5 MG/5ML IV SOLN: 2.5000 mg | INTRAVENOUS | Status: DC | PRN

## 2021-05-06 MED ORDER — BISACODYL 5 MG PO TBEC
10.0000 mg | DELAYED_RELEASE_TABLET | Freq: Every day | ORAL | Status: DC
  Administered 2021-05-07 – 2021-05-11 (×5): 10 mg via ORAL
  Filled 2021-05-06 (×5): qty 2

## 2021-05-06 MED ORDER — SODIUM CHLORIDE (PF) 0.9 % IJ SOLN
OROMUCOSAL | Status: DC | PRN
  Administered 2021-05-06 (×3): 4 mL via TOPICAL

## 2021-05-06 MED ORDER — DEXMEDETOMIDINE HCL IN NACL 400 MCG/100ML IV SOLN
0.0000 ug/kg/h | INTRAVENOUS | Status: DC
  Administered 2021-05-06: 0.5 ug/kg/h via INTRAVENOUS
  Administered 2021-05-06: 0.1 ug/kg/h via INTRAVENOUS
  Filled 2021-05-06 (×2): qty 100

## 2021-05-06 MED ORDER — ACETAMINOPHEN 160 MG/5ML PO SOLN
1000.0000 mg | Freq: Four times a day (QID) | ORAL | Status: DC
  Filled 2021-05-06: qty 40.6

## 2021-05-06 MED ORDER — DEXTROSE 50 % IV SOLN: 0.0000 mL | INTRAVENOUS | Status: DC | PRN

## 2021-05-06 SURGICAL SUPPLY — 91 items
ADH SKN CLS APL DERMABOND .7 (GAUZE/BANDAGES/DRESSINGS) ×2
BAG DECANTER FOR FLEXI CONT (MISCELLANEOUS) ×5 IMPLANT
BLADE CLIPPER SURG (BLADE) ×3 IMPLANT
BLADE STERNUM SYSTEM 6 (BLADE) ×3 IMPLANT
BLOWER MISTER CAL-MED (MISCELLANEOUS) ×3 IMPLANT
BNDG CMPR MED 10X6 ELC LF (GAUZE/BANDAGES/DRESSINGS) ×2
BNDG ELASTIC 4X5.8 VLCR STR LF (GAUZE/BANDAGES/DRESSINGS) ×3 IMPLANT
BNDG ELASTIC 6X10 VLCR STRL LF (GAUZE/BANDAGES/DRESSINGS) ×1 IMPLANT
BNDG ELASTIC 6X5.8 VLCR STR LF (GAUZE/BANDAGES/DRESSINGS) ×3 IMPLANT
BNDG GAUZE ELAST 4 BULKY (GAUZE/BANDAGES/DRESSINGS) ×3 IMPLANT
CABLE SURGICAL S-101-97-12 (CABLE) ×1 IMPLANT
CANISTER SUCT 3000ML PPV (MISCELLANEOUS) ×3 IMPLANT
CANNULA MC2 2 STG 29/37 NON-V (CANNULA) ×2 IMPLANT
CANNULA MC2 TWO STAGE (CANNULA) ×3
CANNULA NON VENT 20FR 12 (CANNULA) ×3 IMPLANT
CLIP RETRACTION 3.0MM CORONARY (MISCELLANEOUS) ×3 IMPLANT
CLIP VESOCCLUDE MED 24/CT (CLIP) IMPLANT
CLIP VESOCCLUDE SM WIDE 24/CT (CLIP) IMPLANT
CONN ST 1/2X1/2  BEN (MISCELLANEOUS) ×3
CONN ST 1/2X1/2 BEN (MISCELLANEOUS) ×2 IMPLANT
CONNECTOR BLAKE 2:1 CARIO BLK (MISCELLANEOUS) ×1 IMPLANT
CONTAINER PROTECT SURGISLUSH (MISCELLANEOUS) ×1 IMPLANT
DERMABOND ADVANCED (GAUZE/BANDAGES/DRESSINGS) ×1
DERMABOND ADVANCED .7 DNX12 (GAUZE/BANDAGES/DRESSINGS) IMPLANT
DRAIN CHANNEL 19F RND (DRAIN) IMPLANT
DRAPE CARDIOVASCULAR INCISE (DRAPES) ×3
DRAPE INCISE IOBAN 66X45 STRL (DRAPES) ×4 IMPLANT
DRAPE SRG 135X102X78XABS (DRAPES) ×2 IMPLANT
DRAPE WARM FLUID 44X44 (DRAPES) ×3 IMPLANT
DRSG COVADERM 4X14 (GAUZE/BANDAGES/DRESSINGS) ×3 IMPLANT
ELECT BLADE 4.0 EZ CLEAN MEGAD (MISCELLANEOUS) ×6
ELECT REM PT RETURN 9FT ADLT (ELECTROSURGICAL) ×6
ELECTRODE BLDE 4.0 EZ CLN MEGD (MISCELLANEOUS) IMPLANT
ELECTRODE REM PT RTRN 9FT ADLT (ELECTROSURGICAL) ×4 IMPLANT
FELT TEFLON 1X6 (MISCELLANEOUS) ×6 IMPLANT
GAUZE 4X4 16PLY ~~LOC~~+RFID DBL (SPONGE) ×1 IMPLANT
GAUZE SPONGE 4X4 12PLY STRL (GAUZE/BANDAGES/DRESSINGS) ×6 IMPLANT
GLOVE SRG 8 PF TXTR STRL LF DI (GLOVE) IMPLANT
GLOVE SURG ENC MOIS LTX SZ7 (GLOVE) ×6 IMPLANT
GLOVE SURG MICRO LTX SZ6 (GLOVE) ×4 IMPLANT
GLOVE SURG MICRO LTX SZ6.5 (GLOVE) ×1 IMPLANT
GLOVE SURG MICRO LTX SZ7.5 (GLOVE) ×3 IMPLANT
GLOVE SURG UNDER POLY LF SZ7.5 (GLOVE) ×6 IMPLANT
GLOVE SURG UNDER POLY LF SZ8 (GLOVE) ×3
GOWN STRL REUS W/ TWL LRG LVL3 (GOWN DISPOSABLE) ×8 IMPLANT
GOWN STRL REUS W/ TWL XL LVL3 (GOWN DISPOSABLE) ×4 IMPLANT
GOWN STRL REUS W/TWL LRG LVL3 (GOWN DISPOSABLE) ×24
GOWN STRL REUS W/TWL XL LVL3 (GOWN DISPOSABLE) ×6
HEMOSTAT POWDER SURGIFOAM 1G (HEMOSTASIS) ×9 IMPLANT
INSERT SUTURE HOLDER (MISCELLANEOUS) ×3 IMPLANT
KIT BASIN OR (CUSTOM PROCEDURE TRAY) ×3 IMPLANT
KIT SUCTION CATH 14FR (SUCTIONS) ×9 IMPLANT
KIT TURNOVER KIT B (KITS) ×3 IMPLANT
KIT VASOVIEW HEMOPRO 2 VH 4000 (KITS) ×3 IMPLANT
LEAD PACING MYOCARDI (MISCELLANEOUS) ×3 IMPLANT
MARKER GRAFT CORONARY BYPASS (MISCELLANEOUS) ×9 IMPLANT
NS IRRIG 1000ML POUR BTL (IV SOLUTION) ×15 IMPLANT
OFFPUMP STABILIZER SUV (MISCELLANEOUS) ×3 IMPLANT
PACK E OPEN HEART (SUTURE) ×3 IMPLANT
PACK OPEN HEART (CUSTOM PROCEDURE TRAY) ×3 IMPLANT
PAD ARMBOARD 7.5X6 YLW CONV (MISCELLANEOUS) ×6 IMPLANT
PAD ELECT DEFIB RADIOL ZOLL (MISCELLANEOUS) ×3 IMPLANT
PENCIL BUTTON HOLSTER BLD 10FT (ELECTRODE) ×4 IMPLANT
POSITIONER ACROBAT-I OFFPUMP (MISCELLANEOUS) ×3 IMPLANT
POSITIONER HEAD DONUT 9IN (MISCELLANEOUS) ×3 IMPLANT
PUNCH AORTIC ROTATE 4.0MM (MISCELLANEOUS) ×1 IMPLANT
PUNCH AORTIC ROTATE 4.5MM 8IN (MISCELLANEOUS) IMPLANT
SHUNT CORONARY AXIUS 1.0 (MISCELLANEOUS) ×2 IMPLANT
SHUNT CORONARY AXIUS 1.25 (MISCELLANEOUS) ×1 IMPLANT
SUPPORT HEART JANKE-BARRON (MISCELLANEOUS) ×3 IMPLANT
SUT BONE WAX W31G (SUTURE) ×3 IMPLANT
SUT MNCRL AB 3-0 PS2 18 (SUTURE) ×6 IMPLANT
SUT PDS AB 1 CTX 36 (SUTURE) ×6 IMPLANT
SUT PROLENE 3 0 SH DA (SUTURE) ×3 IMPLANT
SUT PROLENE 5 0 C 1 36 (SUTURE) ×9 IMPLANT
SUT PROLENE 7 0 BV 1 (SUTURE) ×3 IMPLANT
SUT PROLENE BLUE 7 0 (SUTURE) ×3 IMPLANT
SUT STEEL 6MS V (SUTURE) ×6 IMPLANT
SUT VIC AB 2-0 CT1 27 (SUTURE) ×3
SUT VIC AB 2-0 CT1 TAPERPNT 27 (SUTURE) IMPLANT
SUT VIC AB 3-0 X1 27 (SUTURE) ×1 IMPLANT
SYSTEM SAHARA CHEST DRAIN ATS (WOUND CARE) ×3 IMPLANT
TAPE CLOTH SURG 4X10 WHT LF (GAUZE/BANDAGES/DRESSINGS) ×1 IMPLANT
TAPE PAPER 2X10 WHT MICROPORE (GAUZE/BANDAGES/DRESSINGS) ×1 IMPLANT
TAPES RETRACTO (MISCELLANEOUS) ×7 IMPLANT
TOWEL GREEN STERILE (TOWEL DISPOSABLE) ×3 IMPLANT
TOWEL GREEN STERILE FF (TOWEL DISPOSABLE) ×3 IMPLANT
TRAY FOLEY SLVR 16FR TEMP STAT (SET/KITS/TRAYS/PACK) ×3 IMPLANT
TUBING LAP HI FLOW INSUFFLATIO (TUBING) ×3 IMPLANT
UNDERPAD 30X36 HEAVY ABSORB (UNDERPADS AND DIAPERS) ×3 IMPLANT
WATER STERILE IRR 1000ML POUR (IV SOLUTION) ×6 IMPLANT

## 2021-05-06 NOTE — Brief Op Note (Signed)
04/30/2021 - 05/06/2021  10:50 AM  PATIENT:  Brandon Haynes  80 y.o. male  PRE-OPERATIVE DIAGNOSIS:  Coronary artery disease  POST-OPERATIVE DIAGNOSIS:  Coronary artery disease  PROCEDURE:  Procedure(s): OFF PUMP CORONARY ARTERY BYPASS GRAFTING (CABG) X TWO, USING LEFT INTERNAL MAMMARY ARTERY AND RIGHT LEG GREATER SAPHENOUS VEIN HARVESTED ENDOSCOPICALLY (N/A) TRANSESOPHAGEAL ECHOCARDIOGRAM (TEE) (N/A)  SURGEON:  Surgeon(s) and Role:    * Lightfoot, Lucile Crater, MD - Primary  PHYSICIAN ASSISTANT: Briley Bumgarner PA-C  ASSISTANTS: STAFF    ANESTHESIA:   general  EBL:  908 mL   BLOOD ADMINISTERED:none  DRAINS:  LEFT PLEURAL AND MEDIASTINAL CHEST TUBES    LOCAL MEDICATIONS USED:  NONE  SPECIMEN:  No Specimen  DISPOSITION OF SPECIMEN:  N/A  COUNTS:  YES  TOURNIQUET:  * No tourniquets in log *  DICTATION: .Dragon Dictation  PLAN OF CARE: Admit to inpatient   PATIENT DISPOSITION:  ICU - intubated and hemodynamically stable.   Delay start of Pharmacological VTE agent (>24hrs) due to surgical blood loss or risk of bleeding: yes  COMPLICATIONS: NO KNOWN

## 2021-05-06 NOTE — Anesthesia Postprocedure Evaluation (Signed)
Anesthesia Post Note  Patient: TEKOA KOVAC  Procedure(s) Performed: OFF PUMP CORONARY ARTERY BYPASS GRAFTING (CABG) X TWO, USING LEFT INTERNAL MAMMARY ARTERY AND RIGHT LEG GREATER SAPHENOUS VEIN HARVESTED ENDOSCOPICALLY (Chest) TRANSESOPHAGEAL ECHOCARDIOGRAM (TEE)     Patient location during evaluation: SICU Anesthesia Type: General Level of consciousness: sedated and patient remains intubated per anesthesia plan Pain management: pain level controlled Vital Signs Assessment: post-procedure vital signs reviewed and stable Respiratory status: patient on ventilator - see flowsheet for VS Cardiovascular status: stable Anesthetic complications: no   No notable events documented.  Last Vitals:  Vitals:   05/06/21 1500 05/06/21 1515  BP: 95/67 96/68  Pulse: 84 82  Resp: 12 15  Temp: (!) 34.8 C (!) 34.9 C  SpO2: 100% 100%    Last Pain:  Vitals:   05/06/21 1225  TempSrc: Axillary  PainSc:                  Nolon Nations

## 2021-05-06 NOTE — Progress Notes (Signed)
Echocardiogram Echocardiogram Transesophageal has been performed.  Oneal Deputy [place] RDCS 05/06/2021, 8:54 AM

## 2021-05-06 NOTE — Plan of Care (Signed)
  Problem: Education: Goal: Knowledge of General Education information will improve Description: Including pain rating scale, medication(s)/side effects and non-pharmacologic comfort measures Outcome: Progressing   Problem: Health Behavior/Discharge Planning: Goal: Ability to manage health-related needs will improve Outcome: Progressing   Problem: Clinical Measurements: Goal: Ability to maintain clinical measurements within normal limits will improve Outcome: Progressing Goal: Will remain free from infection Outcome: Progressing Goal: Diagnostic test results will improve Outcome: Progressing Goal: Respiratory complications will improve Outcome: Progressing Goal: Cardiovascular complication will be avoided Outcome: Progressing   Problem: Activity: Goal: Risk for activity intolerance will decrease Outcome: Progressing   Problem: Coping: Goal: Level of anxiety will decrease Outcome: Progressing   Problem: Elimination: Goal: Will not experience complications related to bowel motility Outcome: Progressing Goal: Will not experience complications related to urinary retention Outcome: Progressing   Problem: Pain Managment: Goal: General experience of comfort will improve Outcome: Progressing   Problem: Safety: Goal: Ability to remain free from injury will improve Outcome: Progressing   Problem: Skin Integrity: Goal: Risk for impaired skin integrity will decrease Outcome: Progressing   Problem: Education: Goal: Will demonstrate proper wound care and an understanding of methods to prevent future damage Outcome: Progressing Goal: Knowledge of disease or condition will improve Outcome: Progressing Goal: Knowledge of the prescribed therapeutic regimen will improve Outcome: Progressing Goal: Individualized Educational Video(s) Outcome: Progressing   Problem: Activity: Goal: Risk for activity intolerance will decrease Outcome: Progressing   Problem: Cardiac: Goal: Will  achieve and/or maintain hemodynamic stability Outcome: Progressing   Problem: Clinical Measurements: Goal: Postoperative complications will be avoided or minimized Outcome: Progressing   Problem: Respiratory: Goal: Respiratory status will improve Outcome: Progressing   Problem: Skin Integrity: Goal: Wound healing without signs and symptoms of infection Outcome: Progressing Goal: Risk for impaired skin integrity will decrease Outcome: Progressing   Problem: Urinary Elimination: Goal: Ability to achieve and maintain adequate renal perfusion and functioning will improve Outcome: Progressing

## 2021-05-06 NOTE — Progress Notes (Signed)
     [address]       [place] [address]             [phone]       No events  Vitals:   05/05/21 1957 05/06/21 0515  BP: 127/76 129/82  Pulse: 71 (!) 59  Resp:  20  Temp: 98.4 F (36.9 C) 98 F (36.7 C)  SpO2: 99% 97%   Alert NAD Sinus  EOWB  OR today for CABG 2

## 2021-05-06 NOTE — Procedures (Signed)
Extubation Procedure Note  Patient Details:   Name: Brandon Haynes DOB: September 08, 1941 MRN: [id number]   Airway Documentation:    Vent end date: 05/06/21 Vent end time: 1758   Evaluation  O2 sats: stable throughout Complications: No apparent complications Patient did tolerate procedure well. Bilateral Breath Sounds: Clear   Yes  Patient was extubated to a 4L [place] without any complications, dyspnea or stridor noted. Patient did not have a cuff leak & Ph was out of range (7.29) prior to extubation. Dr. Kipp Brood was notified by RN & gave verbal orders to give 1 amp of Bicarb & albumin & proceed with extubation. NIF: -26, VC: 1L.   Claretta Fraise 05/06/2021, 5:58 PM

## 2021-05-06 NOTE — Anesthesia Procedure Notes (Addendum)
Central Venous Catheter Insertion Performed by: Nolon Nations, MD, anesthesiologist Start/End8/05/2021 7:00 AM, 05/06/2021 7:22 AM Patient location: Pre-op. Preanesthetic checklist: patient identified, IV checked, site marked, risks and benefits discussed, surgical consent, monitors and equipment checked, pre-op evaluation, timeout performed and anesthesia consent Position: Trendelenburg Lidocaine 1% used for infiltration and patient sedated Hand hygiene performed  and maximum sterile barriers used  Catheter size: 9 Fr MAC introducer Procedure performed using ultrasound guided technique. Ultrasound Notes:anatomy identified, needle tip was noted to be adjacent to the nerve/plexus identified, no ultrasound evidence of intravascular and/or intraneural injection and image(s) printed for medical record Attempts: 1 Following insertion, line sutured, dressing applied and Biopatch. Post procedure assessment: blood return through all ports, free fluid flow and no air  Patient tolerated the procedure well with no immediate complications.

## 2021-05-06 NOTE — Anesthesia Procedure Notes (Signed)
Procedure Name: Intubation Date/Time: 05/06/2021 8:34 AM Performed by: Kathryne Hitch, CRNA Pre-anesthesia Checklist: Patient identified, Emergency Drugs available, Suction available and Patient being monitored Patient Re-evaluated:Patient Re-evaluated prior to induction Oxygen Delivery Method: Circle system utilized Preoxygenation: Pre-oxygenation with 100% oxygen Induction Type: IV induction Ventilation: Mask ventilation without difficulty Laryngoscope Size: Miller and 3 Grade View: Grade I Tube type: Oral Tube size: 8.0 mm Number of attempts: 1 Airway Equipment and Method: Stylet and Oral airway Placement Confirmation: ETT inserted through vocal cords under direct vision, positive ETCO2 and breath sounds checked- equal and bilateral Secured at: 26 cm Tube secured with: Tape Dental Injury: Teeth and Oropharynx as per pre-operative assessment

## 2021-05-06 NOTE — Transfer of Care (Signed)
Immediate Anesthesia Transfer of Care Note  Patient: Brandon Haynes  Procedure(s) Performed: OFF PUMP CORONARY ARTERY BYPASS GRAFTING (CABG) X TWO, USING LEFT INTERNAL MAMMARY ARTERY AND RIGHT LEG GREATER SAPHENOUS VEIN HARVESTED ENDOSCOPICALLY (Chest) TRANSESOPHAGEAL ECHOCARDIOGRAM (TEE)  Patient Location: ICU  Anesthesia Type:General  Level of Consciousness: Patient remains intubated per anesthesia plan  Airway & Oxygen Therapy: Patient remains intubated per anesthesia plan and Patient placed on Ventilator (see vital sign flow sheet for setting)  Post-op Assessment: Report given to RN and Post -op Vital signs reviewed and stable  Post vital signs: Reviewed and stable  Last Vitals:  Vitals Value Taken Time  BP    Temp    Pulse 86 05/06/21 1234  Resp 12 05/06/21 1234  SpO2 100 % 05/06/21 1234  Vitals shown include unvalidated device data.  Last Pain:  Vitals:   05/06/21 0515  TempSrc: Oral  PainSc: 0-No pain      Patients Stated Pain Goal: 0 ([id number])  Complications: No notable events documented.

## 2021-05-06 NOTE — Op Note (Signed)
     [address]       ,[place] [address]             [phone]                                         05/06/2021    Patient:  Brandon Haynes Pre-Op Dx: NSTEMI   Coronary artery disease   History of bladder cancer Post-op Dx: Same Procedure: Off pump CABG X 2, LIMA LAD, reverse saphenous vein graft to PDA. Endoscopic greater saphenous vein harvest on the right   Surgeon and Role:      * Rhyker Silversmith, Lucile Crater, MD - Primary    Evonnie Pat, PA-C- assisting   Anesthesia  general EBL: 1 L  Blood Administration: None  Drains: 31 F blake drain:  L, mediastinal  Wires: None Counts: correct   Indications: 80 year old male with two-vessel coronary artery disease and preserved LV function.  Discussed the risks and benefits of surgical revascularization and he is agreeable to proceed with an off-pump CABG x2.  He is tentatively scheduled for 05/06/2021.  Findings: Heavily calcified LAD.  Small PDA.  Friable LIMA.  Good vein conduit.  Operative Technique: After the risks, benefits and alternatives were thoroughly discussed, the patient was brought to the operative theatre.  All invasive lines were placed in pre-op holding.  Anesthesia was induced, and the patient was prepped and draped in normal sterile fashion.  An appropriate surgical pause was performed, and pre-operative antibiotics were dosed accordingly.  We began with simultaneous incisions along the right leg for harvesting of the greater saphenous vein and the chest for the sternotomy.  In regards to the sternotomy, this was carried down with bovie cautery, and the sternum was divided with a reciprocating saw.  Meticulous hemostasis was obtained.  The left internal thoracic artery was exposed and harvested in in pedicled fashion.  The patient was systemically heparinized, and the artery was divided distally, and placed in a papaverine sponge.    The sternal elevator was removed, and a retractor was placed.  The  pericardium was divided in the midline and fashioned into a cradle with pericardial stitches.   We exposed the anterior wall of the heart and identified a good target on  LAD, and fashioned an end to side anastomosis between it and the LITA.  We next exposed the posterior wall and identified a small target on the PDA.  An end to side anastomosis between it and reverse saphenous vein.  Meticulous hemostasis was obtained.    A partial occludding clamp was then placed on the ascending aorta, and we created an end to side anastomosis between it and the proximal vein graft.  The proximal site was marked with a ring.  Hemostasis was obtained, and the heparin was reversed with protamine.  Chest tubes and wires were placed, and the sternum was re-approximated with with sternal wires.  The soft tissue and skin were re-approximated wth absorbable suture.    The patient tolerated the procedure without any immediate complications, and was transferred to the ICU in guarded condition.  [name]

## 2021-05-06 NOTE — Anesthesia Procedure Notes (Signed)
Arterial Line Insertion Start/End8/05/2021 7:22 AM, 05/06/2021 7:22 AM Performed by: Kathryne Hitch, CRNA, CRNA  Patient location: Pre-op. Preanesthetic checklist: patient identified, IV checked, site marked, risks and benefits discussed, surgical consent, monitors and equipment checked, pre-op evaluation, timeout performed and anesthesia consent Lidocaine 1% used for infiltration Left, radial was placed Catheter size: 20 G Hand hygiene performed  and maximum sterile barriers used   Attempts: 1 Procedure performed without using ultrasound guided technique. Following insertion, dressing applied. Post procedure assessment: normal and unchanged

## 2021-05-07 ENCOUNTER — Encounter (HOSPITAL_COMMUNITY): Payer: Self-pay | Admitting: Thoracic Surgery (Cardiothoracic Vascular Surgery)

## 2021-05-07 ENCOUNTER — Inpatient Hospital Stay (HOSPITAL_COMMUNITY): Payer: Medicare HMO

## 2021-05-07 DIAGNOSIS — I200 Unstable angina: Secondary | ICD-10-CM | POA: Diagnosis not present

## 2021-05-07 LAB — MAGNESIUM
Magnesium: 2.1 mg/dL (ref 1.7–2.4)
Magnesium: 2.2 mg/dL (ref 1.7–2.4)

## 2021-05-07 LAB — CBC
HCT: 33.5 % — ABNORMAL LOW (ref 39.0–52.0)
HCT: 31.7 % — ABNORMAL LOW (ref 39.0–52.0)
Hemoglobin: 11.2 g/dL — ABNORMAL LOW (ref 13.0–17.0)
Hemoglobin: 10.2 g/dL — ABNORMAL LOW (ref 13.0–17.0)
MCH: 30 pg (ref 26.0–34.0)
MCH: 29.7 pg (ref 26.0–34.0)
MCHC: 33.4 g/dL (ref 30.0–36.0)
MCHC: 32.2 g/dL (ref 30.0–36.0)
MCV: 89.8 fL (ref 80.0–100.0)
MCV: 92.2 fL (ref 80.0–100.0)
Platelets: 141 10*3/uL — ABNORMAL LOW (ref 150–400)
Platelets: UNDETERMINED 10*3/uL (ref 150–400)
RBC: 3.73 MIL/uL — ABNORMAL LOW (ref 4.22–5.81)
RBC: 3.44 MIL/uL — ABNORMAL LOW (ref 4.22–5.81)
RDW: 15.4 % (ref 11.5–15.5)
RDW: 15.9 % — ABNORMAL HIGH (ref 11.5–15.5)
WBC: 12.2 10*3/uL — ABNORMAL HIGH (ref 4.0–10.5)
WBC: 10 10*3/uL (ref 4.0–10.5)
nRBC: 0 % (ref 0.0–0.2)
nRBC: 0 % (ref 0.0–0.2)

## 2021-05-07 LAB — BASIC METABOLIC PANEL
Anion gap: 7 (ref 5–15)
Anion gap: 6 (ref 5–15)
BUN: 9 mg/dL (ref 8–23)
BUN: 13 mg/dL (ref 8–23)
CO2: 23 mmol/L (ref 22–32)
CO2: 23 mmol/L (ref 22–32)
Calcium: 8.8 mg/dL — ABNORMAL LOW (ref 8.9–10.3)
Calcium: 8.7 mg/dL — ABNORMAL LOW (ref 8.9–10.3)
Chloride: 108 mmol/L (ref 98–111)
Chloride: 107 mmol/L (ref 98–111)
Creatinine, Ser: 1.07 mg/dL (ref 0.61–1.24)
Creatinine, Ser: 1.11 mg/dL (ref 0.61–1.24)
GFR, Estimated: >60 mL/min (ref >60)
GFR, Estimated: >60 mL/min (ref >60)
Glucose, Bld: 149 mg/dL — ABNORMAL HIGH (ref 70–99)
Glucose, Bld: 167 mg/dL — ABNORMAL HIGH (ref 70–99)
Potassium: 4.1 mmol/L (ref 3.5–5.1)
Potassium: 4.2 mmol/L (ref 3.5–5.1)
Sodium: 138 mmol/L (ref 135–145)
Sodium: 136 mmol/L (ref 135–145)

## 2021-05-07 LAB — GLUCOSE, CAPILLARY
Glucose-Capillary: 119 mg/dL — ABNORMAL HIGH (ref 70–99)
Glucose-Capillary: 125 mg/dL — ABNORMAL HIGH (ref 70–99)
Glucose-Capillary: 127 mg/dL — ABNORMAL HIGH (ref 70–99)
Glucose-Capillary: 129 mg/dL — ABNORMAL HIGH (ref 70–99)
Glucose-Capillary: 132 mg/dL — ABNORMAL HIGH (ref 70–99)
Glucose-Capillary: 141 mg/dL — ABNORMAL HIGH (ref 70–99)
Glucose-Capillary: 141 mg/dL — ABNORMAL HIGH (ref 70–99)
Glucose-Capillary: 142 mg/dL — ABNORMAL HIGH (ref 70–99)
Glucose-Capillary: 143 mg/dL — ABNORMAL HIGH (ref 70–99)
Glucose-Capillary: 144 mg/dL — ABNORMAL HIGH (ref 70–99)
Glucose-Capillary: 146 mg/dL — ABNORMAL HIGH (ref 70–99)
Glucose-Capillary: 150 mg/dL — ABNORMAL HIGH (ref 70–99)
Glucose-Capillary: 151 mg/dL — ABNORMAL HIGH (ref 70–99)
Glucose-Capillary: 154 mg/dL — ABNORMAL HIGH (ref 70–99)
Glucose-Capillary: 158 mg/dL — ABNORMAL HIGH (ref 70–99)
Glucose-Capillary: 163 mg/dL — ABNORMAL HIGH (ref 70–99)

## 2021-05-07 MED ORDER — ASPIRIN EC 81 MG PO TBEC
81.0000 mg | DELAYED_RELEASE_TABLET | Freq: Every day | ORAL | Status: DC
  Administered 2021-05-07 – 2021-05-11 (×5): 81 mg via ORAL
  Filled 2021-05-07 (×4): qty 1

## 2021-05-07 MED ORDER — ALBUMIN HUMAN 5 % IV SOLN
12.5000 g | INTRAVENOUS | Status: DC | PRN
  Administered 2021-05-07 (×2): 12.5 g via INTRAVENOUS
  Filled 2021-05-07 (×2): qty 250

## 2021-05-07 MED ORDER — PANTOPRAZOLE SODIUM 40 MG PO TBEC
40.0000 mg | DELAYED_RELEASE_TABLET | Freq: Every day | ORAL | Status: DC
  Administered 2021-05-07 – 2021-05-11 (×5): 40 mg via ORAL
  Filled 2021-05-07 (×5): qty 1

## 2021-05-07 MED ORDER — INSULIN DETEMIR 100 UNIT/ML ~~LOC~~ SOLN
15.0000 [IU] | Freq: Once | SUBCUTANEOUS | Status: AC
  Administered 2021-05-07: 15 [IU] via SUBCUTANEOUS
  Filled 2021-05-07 (×2): qty 0.15

## 2021-05-07 MED ORDER — ENOXAPARIN SODIUM 30 MG/0.3ML IJ SOSY
30.0000 mg | PREFILLED_SYRINGE | Freq: Every day | INTRAMUSCULAR | Status: DC
  Administered 2021-05-07 – 2021-05-10 (×4): 30 mg via SUBCUTANEOUS
  Filled 2021-05-07 (×4): qty 0.3

## 2021-05-07 MED ORDER — CLOPIDOGREL BISULFATE 75 MG PO TABS
75.0000 mg | ORAL_TABLET | Freq: Every day | ORAL | Status: DC
  Administered 2021-05-07 – 2021-05-11 (×5): 75 mg via ORAL
  Filled 2021-05-07 (×5): qty 1

## 2021-05-07 MED ORDER — ROSUVASTATIN CALCIUM 20 MG PO TABS
40.0000 mg | ORAL_TABLET | Freq: Every day | ORAL | Status: DC
  Administered 2021-05-07 – 2021-05-11 (×5): 40 mg via ORAL
  Filled 2021-05-07 (×5): qty 2

## 2021-05-07 MED ORDER — INSULIN ASPART 100 UNIT/ML IJ SOLN
0.0000 [IU] | Freq: Three times a day (TID) | INTRAMUSCULAR | Status: DC
  Administered 2021-05-07: 4 [IU] via SUBCUTANEOUS
  Administered 2021-05-07 – 2021-05-08 (×2): 2 [IU] via SUBCUTANEOUS
  Administered 2021-05-08 (×2): 4 [IU] via SUBCUTANEOUS
  Administered 2021-05-10: 2 [IU] via SUBCUTANEOUS

## 2021-05-07 MED ORDER — INSULIN ASPART 100 UNIT/ML IJ SOLN: 0.0000 [IU] | INTRAMUSCULAR | Status: DC

## 2021-05-07 MED FILL — Lidocaine HCl Local Preservative Free (PF) Inj 2%: INTRAMUSCULAR | Qty: 15 | Status: AC

## 2021-05-07 MED FILL — Heparin Sodium (Porcine) Inj 1000 Unit/ML: INTRAMUSCULAR | Qty: 30 | Status: AC

## 2021-05-07 MED FILL — Potassium Chloride Inj 2 mEq/ML: INTRAVENOUS | Qty: 40 | Status: AC

## 2021-05-07 MED FILL — Sodium Chloride IV Soln 0.9%: INTRAVENOUS | Qty: 2000 | Status: AC

## 2021-05-07 NOTE — Progress Notes (Signed)
Progress Note  Patient Name: Brandon Haynes Date of Encounter: 05/07/2021  [facility] Cardiologist: Fransico Him, MD   Subjective   Doing well. Chest wall sore.   Inpatient Medications    Scheduled Meds:  acetaminophen  1,000 mg Oral Q6H   Or   acetaminophen (TYLENOL) oral liquid 160 mg/5 mL  1,000 mg Per Tube Q6H   allopurinol  300 mg Oral Daily   aspirin EC  81 mg Oral Daily   bisacodyl  10 mg Oral Daily   Or   bisacodyl  10 mg Rectal Daily   Chlorhexidine Gluconate Cloth  6 each Topical Daily   clopidogrel  75 mg Oral Daily   docusate sodium  200 mg Oral Daily   enoxaparin (LOVENOX) injection  30 mg Subcutaneous QHS   gabapentin  300 mg Oral BID   insulin aspart  0-24 Units Subcutaneous Q4H   mouth rinse  15 mL Mouth Rinse BID   metoprolol tartrate  12.5 mg Oral BID   Or   metoprolol tartrate  12.5 mg Per Tube BID   mupirocin ointment   Nasal BID   pantoprazole  40 mg Oral Daily   rosuvastatin  40 mg Oral Daily   sodium chloride flush  3 mL Intravenous Q12H   Continuous Infusions:  sodium chloride 20 mL/hr at 05/07/21 0700   sodium chloride     sodium chloride      ceFAZolin (ANCEF) IV Stopped (05/07/21 0644)   dexmedetomidine (PRECEDEX) IV infusion Stopped (05/07/21 0044)   DOBUTamine Stopped (05/06/21 1220)   insulin 1.7 Units/hr (05/07/21 0700)   lactated ringers Stopped (05/06/21 1220)   lactated ringers Stopped (05/06/21 1842)   niCARDipine Stopped (05/06/21 1220)   nitroGLYCERIN 0 mcg/min (05/06/21 1220)   norepinephrine (LEVOPHED) Adult infusion 4 mcg/min (05/07/21 0700)   PRN Meds: sodium chloride, dextrose, metoprolol tartrate, midazolam, morphine injection, ondansetron (ZOFRAN) IV, oxyCODONE, sodium chloride flush, traMADol   Vital Signs    Vitals:   05/07/21 0700 05/07/21 0715 05/07/21 0817 05/07/21 0912  BP: (!) 107/59   (!) 117/58  Pulse: 78 74  73  Resp: 15 15    Temp:   98.7 F (37.1 C)   TempSrc:   Oral   SpO2: 95% 96%     Weight:      Height:        Intake/Output Summary (Last 24 hours) at 05/07/2021 0959 Last data filed at 05/07/2021 0800 Gross per 24 hour  Intake 6989.65 ml  Output 3083 ml  Net 3906.65 ml   Last 3 Weights 05/07/2021 05/06/2021 05/05/2021  Weight (lbs) 213 lb 10 oz 202 lb 13.2 oz 205 lb 4 oz  Weight (kg) 96.9 kg 92 kg 93.1 kg      Telemetry    Sinus - Personally Reviewed  ECG    Sinus this am, personally reviewed by me.   Physical Exam   General: Well developed, well nourished, NAD  HEENT: OP clear, MMM SKIN: warm, dry. Neuro: No focal deficits  Psychiatric: Mood and affect normal  Lungs:Clear bilaterally, no wheezes, rhonci, crackles Cardiovascular: Regular rate and rhythm. No murmurs, gallops or rubs. Abdomen:Soft.  Extremities: Minimal LE edema.   Labs    High Sensitivity Troponin:   Recent Labs  Lab 04/30/21 1236 04/30/21 1308  TROPONINIHS 5 5      Chemistry Recent Labs  Lab 04/30/21 1236 04/30/21 1311 05/06/21 0037 05/06/21 0836 05/06/21 1134 05/06/21 1138 05/06/21 1830 05/06/21 1907 05/07/21 0312  NA  140   < > 139   < > 142   < > 138 142 138  K 4.3   < > 3.8   < > 3.7   < > 4.1 4.0 4.1  CL 107   < > 106   < > 105  --  109  --  108  CO2 24   < > 25  --   --   --  22  --  23  GLUCOSE 97   < > 96   < > 142*  --  136*  --  149*  BUN 20   < > 8   < > 8  --  8  --  9  CREATININE 1.11   < > 0.89   < > 0.80  --  0.92  --  1.07  CALCIUM 9.3   < > 9.0  --   --   --  8.4*  --  8.8*  PROT 6.7  --   --   --   --   --   --   --   --   ALBUMIN 4.0  --   --   --   --   --   --   --   --   AST 21  --   --   --   --   --   --   --   --   ALT 16  --   --   --   --   --   --   --   --   ALKPHOS 40  --   --   --   --   --   --   --   --   BILITOT 1.0  --   --   --   --   --   --   --   --   GFRNONAA >60   < > >60  --   --   --  >60  --  >60  ANIONGAP 9   < > 8  --   --   --  7  --  7   < > = values in this interval not displayed.     Hematology Recent Labs   Lab 05/06/21 1221 05/06/21 1244 05/06/21 1830 05/06/21 1907 05/07/21 0312  WBC 11.4*  --  8.2  --  12.2*  RBC 4.03*  --  3.72*  --  3.73*  HGB 12.1*   < > 11.2* 10.5* 11.2*  HCT 36.5*   < > 33.7* 31.0* 33.5*  MCV 90.6  --  90.6  --  89.8  MCH 30.0  --  30.1  --  30.0  MCHC 33.2  --  33.2  --  33.4  RDW 15.2  --  15.0  --  15.4  PLT 125*  --  103*  --  141*   < > = values in this interval not displayed.    BNP Recent Labs  Lab 04/30/21 1256  BNP 125.7*     DDimer No results for input(s): DDIMER in the last 168 hours.   Radiology    DG Chest Port 1 View  Result Date: 05/07/2021 CLINICAL DATA:  Status post CABG. EXAM: PORTABLE CHEST 1 VIEW COMPARISON:  Chest x-ray from yesterday. FINDINGS: Interval extubation and removal of the enteric tube. Unchanged right internal jugular sheath. Unchanged mediastinal drain and left-sided chest tube. Stable cardiomediastinal silhouette status post CABG. Decreased lung  volumes with increased mild left greater than right bibasilar atelectasis. No pneumothorax or large pleural effusion. No acute osseous abnormality. IMPRESSION: 1. Interval extubation and removal of the enteric tube. 2. Decreased lung volumes with increased bibasilar atelectasis. Electronically Signed   By: Titus Dubin M.D.   On: 05/07/2021 08:22   DG Chest Port 1 View  Result Date: 05/06/2021 CLINICAL DATA:  Postop CABG. EXAM: PORTABLE CHEST 1 VIEW COMPARISON:  Radiographs 04/30/2021 and 11/18/2020.  CT 02/19/2020. FINDINGS: 1248 hours. Status post median sternotomy and CABG. Tip of the endotracheal tube is in the mid trachea. An enteric tube projects below the diaphragm, tip not visualized. A right IJ sheath projects over the right clavicular head, and there is a femorally placed Swan-Ganz catheter which projects over the left pulmonary artery. The heart size and mediastinal contours are stable. There is aortic atherosclerosis. Mild atelectasis is present at both lung bases. No  edema, pneumothorax or significant pleural effusion. IMPRESSION: No demonstrated complication following CABG. Support system as above. Mild bibasilar atelectasis. Electronically Signed   By: Richardean Sale M.D.   On: 05/06/2021 13:16   ECHO INTRAOPERATIVE TEE  Result Date: 05/06/2021  *INTRAOPERATIVE TRANSESOPHAGEAL REPORT *  Patient Name:   TAMARICK BRODIE  Date of Exam: 05/06/2021 Medical Rec #:  [id number]      Height:       74.0 in Accession #:    [id number]     Weight:       202.8 lb Date of Birth:  [birth year]      BSA:          2.19 m Patient Age:    34 years       BP:           129/82 mmHg Patient Gender: M              HR:           74 bpm. Exam Location:  Anesthesiology Transesophogeal exam was perform intraoperatively during surgical procedure. Patient was closely monitored under general anesthesia during the entirety of examination. Indications:     CAD Native Vessel i25.1 Sonographer:     Raquel Sarna Senior RDCS Performing Phys: [id number] Lucile Crater LIGHTFOOT Diagnosing Phys: Nolon Nations MD Complications: No known complications during this procedure. POST-OP IMPRESSIONS _ Left Ventricle: The left ventricle is unchanged from pre-bypass. _ Right Ventricle: The right ventricle appears unchanged from pre-bypass. _ Aorta: The aorta appears unchanged from pre-bypass. _ Left Atrium: The left atrium appears unchanged from pre-bypass. _ Aortic Valve: The aortic valve appears unchanged from pre-bypass. _ Mitral Valve: The mitral valve appears unchanged from pre-bypass. _ Tricuspid Valve: The tricuspid valve appears unchanged from pre-bypass. _ Pulmonic Valve: The pulmonic valve appears unchanged from pre-bypass. _ Interventricular Septum: The interventricular septum appears unchanged from pre-bypass. PRE-OP FINDINGS  Left Ventricle: The left ventricle has normal systolic function, with an ejection fraction of 55-60%. The cavity size was normal. There is no increase in left ventricular wall thickness. Right Ventricle: The  right ventricle has normal systolic function. The cavity was normal. There is no increase in right ventricular wall thickness. Left Atrium: Left atrial size was dilated. No left atrial/left atrial appendage thrombus was detected. Right Atrium: Right atrial size was normal in size. Interatrial Septum: No atrial level shunt detected by color flow Doppler. Pericardium: There is no evidence of pericardial effusion. Mitral Valve: The mitral valve is normal in structure. Mitral valve regurgitation is trivial by color flow Doppler. There is  no evidence of mitral valve vegetation. Tricuspid Valve: The tricuspid valve was normal in structure. Tricuspid valve regurgitation is trivial by color flow Doppler. There is no evidence of tricuspid valve vegetation. Aortic Valve: The aortic valve is tricuspid Aortic valve regurgitation is mild by color flow Doppler. There is no stenosis of the aortic valve. Pulmonic Valve: The pulmonic valve was abnormal. Pulmonic valve regurgitation is trivial by color flow Doppler. Aorta: There is mild dilatation of the ascending aorta, measuring 42 mm. There is evidence of plaque in the descending aorta; Grade III, measuring 3-44m in size.  JNolon NationsMD Electronically signed by JNolon NationsMD Signature Date/Time: 05/06/2021/1:01:03 PM    Final     Cardiac Studies   Echo 05/01/21:  1. Left ventricular ejection fraction, by estimation, is 55 to 60%. The  left ventricle has normal function. The left ventricle has no regional  wall motion abnormalities. Left ventricular diastolic parameters are  consistent with Grade I diastolic  dysfunction (impaired relaxation).   2. Right ventricular systolic function is normal. The right ventricular  size is normal. Tricuspid regurgitation signal is inadequate for assessing  PA pressure.   3. Left atrial size was mildly dilated.   4. The mitral valve is normal in structure. Trivial mitral valve  regurgitation. No evidence of mitral stenosis.    5. The aortic valve is tricuspid. Aortic valve regurgitation is mild.  Mild aortic valve sclerosis is present, with no evidence of aortic valve  stenosis.   6. Aortic dilatation noted. There is borderline dilatation of the aortic  root, measuring 37 mm.   7. The inferior vena cava is normal in size with greater than 50%  respiratory variability, suggesting right atrial pressure of 3 mmHg.   Cardiac catheterization (05/01/2021)   Conclusion       Prox RCA-1 lesion is 50% stenosed.   Prox RCA-2 lesion is 50% stenosed.   Mid RCA lesion is 100% stenosed.   Prox Cx lesion is 30% stenosed.   Mid Cx to Dist Cx lesion is 50% stenosed.   Mid LAD-2 lesion is 20% stenosed.   Mid LAD-1 lesion is 99% stenosed.   Dist LAD lesion is 30% stenosed.   The left ventricular systolic function is normal.   LV end diastolic pressure is normal.   The left ventricular ejection fraction is 55-65% by visual estimate.   There is no mitral valve regurgitation.   Severe heavily calcified mid LAD stenosis. Patent mid LAD stent with minimal restenosis. Mild to moderate Circumflex stenosis Chronic occlusion mid RCA. The distal branches fill briskly from left to right collaterals Preserved LV systolic function Unsuccessful attempt at PCI of the mid LAD lesion as I was unable to cross the lesion with a wire.   Recommendations: Will consult CT surgery for CABG as PCI was not successful despite a long attempt at crossing the lesion. He was loaded with Plavix 600 mg this am in anticipation of PCI. He will need Plavix washout before surgery. Coronary Diagrams     Diagnostic Dominance: Right       Patient Profile     80y.o. male with a history of CAD s/p LAD stent and occluded RCA with collaterals, hypertension, hyperlipidemia, chronic pain syndrome, bladder cancer s/p TURBT/chemotherapy/radical cystectomy and lymphadenectomy 12/13/19, gout, who was admitted with unstable angina, dyspnea, fatigue and syncope.  Cardiac cath with severe three vessel CAD.  2V CABG on 05/06/21.   Assessment & Plan    CAD with unstable angina:  Cardiac cath with three vessel CAD. He is now s/p 2V CABG. He is doing well this am. Will continue ASA, Plavix, statin and beta blocker.    HTN: BP stable   For questions or updates, please contact [place] Please consult [email or web address] for contact info under        Signed, Lauree Chandler, MD  05/07/2021, 9:59 AM

## 2021-05-07 NOTE — Plan of Care (Signed)
  Problem: Education: Goal: Knowledge of General Education information will improve Description: Including pain rating scale, medication(s)/side effects and non-pharmacologic comfort measures Outcome: Progressing   Problem: Health Behavior/Discharge Planning: Goal: Ability to manage health-related needs will improve Outcome: Progressing   Problem: Clinical Measurements: Goal: Ability to maintain clinical measurements within normal limits will improve Outcome: Progressing Goal: Will remain free from infection Outcome: Progressing Goal: Diagnostic test results will improve Outcome: Progressing Goal: Respiratory complications will improve Outcome: Progressing Goal: Cardiovascular complication will be avoided Outcome: Progressing   Problem: Activity: Goal: Risk for activity intolerance will decrease Outcome: Progressing   Problem: Coping: Goal: Level of anxiety will decrease Outcome: Progressing   Problem: Elimination: Goal: Will not experience complications related to bowel motility Outcome: Progressing Goal: Will not experience complications related to urinary retention Outcome: Progressing   Problem: Pain Managment: Goal: General experience of comfort will improve Outcome: Progressing   Problem: Safety: Goal: Ability to remain free from injury will improve Outcome: Progressing   Problem: Skin Integrity: Goal: Risk for impaired skin integrity will decrease Outcome: Progressing   Problem: Education: Goal: Will demonstrate proper wound care and an understanding of methods to prevent future damage Outcome: Progressing Goal: Knowledge of disease or condition will improve Outcome: Progressing Goal: Knowledge of the prescribed therapeutic regimen will improve Outcome: Progressing Goal: Individualized Educational Video(s) Outcome: Progressing   Problem: Activity: Goal: Risk for activity intolerance will decrease Outcome: Progressing   Problem: Cardiac: Goal: Will  achieve and/or maintain hemodynamic stability Outcome: Progressing   Problem: Clinical Measurements: Goal: Postoperative complications will be avoided or minimized Outcome: Progressing   Problem: Respiratory: Goal: Respiratory status will improve Outcome: Progressing   Problem: Skin Integrity: Goal: Wound healing without signs and symptoms of infection Outcome: Progressing Goal: Risk for impaired skin integrity will decrease Outcome: Progressing   Problem: Urinary Elimination: Goal: Ability to achieve and maintain adequate renal perfusion and functioning will improve Outcome: Progressing

## 2021-05-07 NOTE — Hospital Course (Addendum)
History of Present Illness:   at time of consultation   We are asked to see this 80 year old male in cardiothoracic surgical consultation due to severe coronary artery disease.  The patient was hospitalized in March 2016 for chest pain and syncope concerning for unstable angina.  At that time he underwent cardiac catheterization and this revealed a severe LAD 99% stenosis which was treated with DES.  He also had an occluded RCA with left-to-right collaterals.  He was placed at that time on aspirin, Brilinta, Crestor and metoprolol for medical therapy.  Echocardiogram at that time showed an ejection fraction of 50 to 55% with akinesis of the basal-mid inferoseptal myocardium, akinesis of the apical lateral septal myocardium and grade 1 diastolic dysfunction.  There now he was last seen by Dr. Radford Pax on 03/11/2021 1and was doing well from a clinical perspective without anginal symptoms.  On 04/30/2021 he was seen by his pcp with complaints of 2 weeks onset of progressive worsening fatigue and shortness of breath with exertion.  He is normally very active mowing his grass and working around [employer] grounds as well as working in his garden.  A few hours before the visit he experienced some mid sternal chest tightness as well as dizziness.  It is also associated with some nausea and he subsequently passed out and hit his left forehead on a carpeted floor.  This was an unwitnessed fall.  EMS was called called during MD visit and he was taken to the emergency department.  Initial high-sensitivity troponins were negative x2 and BNP was 125.  CT scan of the head showed no acute bleed..  EKG showed no acute ST's T wave segment changes.  He was found to have some bradycardia with heart rate in the 50-60 range.  Cardiology was asked to see the patient in consultation and he was admitted to the telemetry unit with plans for cardiac catheterization.  This was done on today's date.  Findings are listed below and inclusive  of a heavily calcified mid LAD stenosis, patent mid LAD stent with minimal stenosis.  This lesion had a unsuccessfull attempted PCI as the wire was unable to cross the lesion.  He is noted to have continued preserved LV systolic function.  We are asked to see for consideration of coronary artery surgical revascularization.  The patient and all relevant studies were evaluated by Dr. Kipp Brood who recommended proceeding with coronary artery surgical revascularization as the best option due to the severity of the anatomical findings as well as increasing symptoms.   Hospital course:  Patient was medically stabilized and felt able to proceed with surgery.  On 05/06/2021 he was taken to the operating room and underwent coronary bypass grafting x2, off-pump.  He tolerated the procedure well was taken to the surgical intensive care unit in stable condition.  Postoperative hospital course:  The patient was extubated using standard post cardiac surgical protocols.  He has remained hemodynamically stable.  All routine lines, monitors and drainage devices have been discontinued in standard fashion.  Renal function has remained within normal limits.  He does have an expected acute blood loss anemia which is stable. He was on Plavix  and ec asa 81 given his presentation of unstable angina and NSTEMI.  He was started on Midodrine for low BP. He was weaned off the Insulin drip. His pre op HGA1C was 5.7. He likely has borderline diabetes. We will stop accu checks and SS PRN after transfer. He has expected post  op blood loss anemia. His last H and H was ***.  He had thrombocytopenia. His last platelet count was up to **.He has been surgically stable for transfer to 4E for the past few days;however, there was no bed available until 08/13. Patient has been ambulating on room air. He has been tolerating a diet and has had a bowel movement. Sternal and RLE wounds are clean, dry, and continuing to heal.

## 2021-05-07 NOTE — Discharge Summary (Signed)
[address]       [place] [address]             [phone]    Physician Discharge Summary  Patient ID: Brandon Haynes MRN: [id number] DOB/AGE: [birth year] 78 y.o.  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   Recurrent epistaxis 10/19/2017   Bradycardia, drug induced 09/08/2016   Bilateral sensorineural hearing loss 06/30/2016   Subjective tinnitus, bilateral 06/30/2016   Prediabetes 04/16/2016   Leg pain 03/06/2016   Dyspnea on exertion 08/23/2015   Dizziness 08/23/2015   Rash 08/23/2015   Coronary artery disease involving native coronary artery of native heart without angina pectoris    Syncope 12/03/2014   Osteoarthritis of multiple joints 10/18/2014   Symptomatic hypotension 03/22/2014   Goals of care, counseling/discussion 03/22/2014   Encounter to establish care with new doctor 02/26/2014   Rotator cuff tear 08/28/2013   GERD 08/29/2010   HEARING LOSS 06/21/2008   PERIPHERAL VASCULAR DISEASE 06/21/2008   ALLERGIC RHINITIS 06/21/2008   BASAL CELL CARCINOMA, HX OF 06/21/2008   COLONIC POLYPS 03/29/2008   HYPERCHOLESTEROLEMIA 03/29/2008   GOUT 03/29/2008   ANXIETY 03/29/2008   Essential hypertension 03/29/2008   DEGENERATIVE [place] DISEASE 03/29/2008   BACK PAIN, LUMBAR 03/29/2008     Discharge Diagnoses:  Patient Active Problem List   Diagnosis Date Noted   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   Unstable angina (HCC)    Chest pain 04/30/2021   Lumbar radiculopathy 01/07/2021   Sepsis ([place]) 02/20/2020   Colitis 02/19/2020   Bladder cancer ([place]) 12/13/2019   Port-A-Cath in place  10/04/2019   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   Recurrent epistaxis 10/19/2017   Bradycardia, drug induced 09/08/2016   Bilateral sensorineural hearing loss 06/30/2016   Subjective tinnitus, bilateral 06/30/2016   Prediabetes 04/16/2016   Leg pain 03/06/2016   Dyspnea on exertion 08/23/2015   Dizziness 08/23/2015   Rash 08/23/2015   Coronary artery disease involving native coronary artery of native heart without angina pectoris    Syncope 12/03/2014   Osteoarthritis of multiple joints 10/18/2014   Symptomatic hypotension 03/22/2014   Goals of care, counseling/discussion 03/22/2014   Encounter to establish care with new doctor 02/26/2014   Rotator cuff tear 08/28/2013   GERD 08/29/2010   HEARING LOSS 06/21/2008   PERIPHERAL VASCULAR DISEASE 06/21/2008   ALLERGIC RHINITIS 06/21/2008   BASAL CELL CARCINOMA, HX OF 06/21/2008   COLONIC POLYPS 03/29/2008   HYPERCHOLESTEROLEMIA 03/29/2008   GOUT 03/29/2008   ANXIETY 03/29/2008   Essential hypertension 03/29/2008   DEGENERATIVE [place] DISEASE 03/29/2008   BACK PAIN, LUMBAR 03/29/2008    Discharged Condition: stable  History of Present Illness:   at time of consultation   We are asked to see this 80 year old male in cardiothoracic surgical consultation due to severe coronary artery disease.  The patient was hospitalized in March 2016 for chest pain and syncope concerning for unstable angina.  At that time he underwent cardiac catheterization and this revealed a severe LAD 99% stenosis which was treated with DES.  He also had an occluded  RCA with left-to-right collaterals.  He was placed at that time on aspirin, Brilinta, Crestor and metoprolol for medical therapy.  Echocardiogram at that time showed an ejection fraction of 50 to 55% with akinesis of the basal-mid inferoseptal myocardium, akinesis of the apical lateral septal myocardium and grade 1 diastolic dysfunction.  There now he was last seen by Dr. Radford Pax on  03/11/2021 1and was doing well from a clinical perspective without anginal symptoms.  On 04/30/2021 he was seen by his pcp with complaints of 2 weeks onset of progressive worsening fatigue and shortness of breath with exertion.  He is normally very active mowing his grass and working around [employer] grounds as well as working in his garden.  A few hours before the visit he experienced some mid sternal chest tightness as well as dizziness.  It is also associated with some nausea and he subsequently passed out and hit his left forehead on a carpeted floor.  This was an unwitnessed fall.  EMS was called called during MD visit and he was taken to the emergency department.  Initial high-sensitivity troponins were negative x2 and BNP was 125.  CT scan of the head showed no acute bleed..  EKG showed no acute ST's T wave segment changes.  He was found to have some bradycardia with heart rate in the 50-60 range.  Cardiology was asked to see the patient in consultation and he was admitted to the telemetry unit with plans for cardiac catheterization.  This was done on today's date.  Findings are listed below and inclusive of a heavily calcified mid LAD stenosis, patent mid LAD stent with minimal stenosis.  This lesion had a unsuccessfull attempted PCI as the wire was unable to cross the lesion.  He is noted to have continued preserved LV systolic function.  We are asked to see for consideration of coronary artery surgical revascularization.  The patient and all relevant studies were evaluated by Dr. Kipp Brood who recommended proceeding with coronary artery surgical revascularization as the best option due to the severity of the anatomical findings as well as increasing symptoms.   Hospital course:  Patient was medically stabilized and felt able to proceed with surgery.  On 05/06/2021 he was taken to the operating room and underwent coronary bypass grafting x2, off-pump.  He tolerated the procedure well was taken to the  surgical intensive care unit in stable condition.  Postoperative hospital course:  The patient was extubated using standard post cardiac surgical protocols.  He has remained hemodynamically stable.  All routine lines, monitors and drainage devices have been discontinued in standard fashion.  Renal function has remained within normal limits.  He does have an expected acute blood loss anemia which is stable. He was on Plavix  and ec asa 81 given his presentation of unstable angina and NSTEMI.  He was started on Midodrine for low BP. He was weaned off the Insulin drip. His pre op HGA1C was 5.7. He likely has borderline diabetes. We will stop accu checks and SS PRN after transfer. He has expected post op blood loss anemia. His last H and H was stable at 10.3 and 33.1.  He had thrombocytopenia. His last platelet count was up to 111,000.He has been surgically stable for transfer to 4E for the past few days;however, there was no bed available. BP improved and Midodrine was stopped on 08/14. He has no LE edema. Per Dr. Cyndia Bent, no need for Lasix at discharge. Patient has been ambulating on room air. He  has been tolerating a diet and after a laxative, had a bowel movement. Sternal and RLE wounds are clean, dry, and continuing to heal. Chest tube sutures will be removed prior to discharge.   Consults: cardiology  Significant Diagnostic Studies:    CLINICAL DATA:  Status post CABG.   EXAM: PORTABLE CHEST 1 VIEW   COMPARISON:  05/07/2021   FINDINGS: Stable heart size. Mediastinal drain remains. Improved aeration at both lung bases with some residual mild bibasilar atelectasis remaining. No pneumothorax. Probable trace/small left pleural effusion. No pulmonary edema.   IMPRESSION: Improved aeration of both lung bases with mild residual bibasilar atelectasis. Probable trace left pleural effusion.     Electronically Signed   By: Aletta Edouard M.D.   On: 05/08/2021 08:47      Treatments:  Off  pump CABG X 2, LIMA LAD, reverse saphenous vein graft to PDA. Endoscopic greater saphenous vein harvest on the righ by Dr. Kipp Brood on 05/06/2021.  Discharge Exam: Blood pressure 138/78, pulse 68, temperature 98.4 F (36.9 C), temperature source Oral, resp. rate 12, height '6\' 2"'$  (1.88 m), weight 96.8 kg, SpO2 95 %.  Physical Exam: General appearance: alert and cooperative Neurologic: intact Heart: regular rate and rhythm Lungs: clear to auscultation bilaterally Abdomen: soft, non-tender; bowel sounds normal Extremities:no edema Wound: incision ok  Discharge Medications:  The patient has been discharged on:   1.Beta Blocker:  Yes [  x ]                              No   [   ]                              If No, reason:  2.Ace Inhibitor/ARB: Yes [   ]                                     No  [   x ]                                     If No, reason:Labile BP  3.Statin:   Yes [  x ]                  No  [   ]                  If No, reason:  4.Ecasa:  Yes  [ x  ]                  No   [   ]                  If No, reason:  Patient had ACS upon admission:Yes  Plavix/P2Y12 inhibitor: Yes [   x]                                      No  [   ]                        Discharge Instructions     Amb Referral to Cardiac  Rehabilitation   Complete by: As directed    Diagnosis: CABG   CABG X ___: 2   After initial evaluation and assessments completed: Virtual Based Care may be provided alone or in conjunction with Phase 2 Cardiac Rehab based on patient barriers.: Yes      Allergies as of 05/11/2021   No Known Allergies      Medication List     STOP taking these medications    nitroGLYCERIN 0.4 MG SL tablet Commonly known as: NITROSTAT       TAKE these medications    acetaminophen 650 MG CR tablet Commonly known as: TYLENOL Take 650 mg by mouth every 8 (eight) hours as needed for pain.   allopurinol 300 MG tablet Commonly known as: ZYLOPRIM TAKE 1 TABLET BY  MOUTH EVERY DAY   aspirin EC 81 MG tablet Take 81 mg by mouth daily.   clopidogrel 75 MG tablet Commonly known as: PLAVIX Take 1 tablet (75 mg total) by mouth daily.   D3 PO Take 1 capsule by mouth daily.   desonide 0.05 % cream Commonly known as: DESOWEN Apply 1 application topically daily as needed (dry skin).   EPINEPHrine 0.3 mg/0.3 mL Soaj injection Commonly known as: EPI-PEN Inject 0.3 mg into the muscle as needed for anaphylaxis.   gabapentin 300 MG capsule Commonly known as: NEURONTIN Take 300 mg by mouth 2 (two) times daily.   hydrocortisone cream 1 % Apply 1 application topically 4 (four) times daily as needed for itching.   loratadine 10 MG tablet Commonly known as: CLARITIN Take 20 mg by mouth daily.   metoprolol tartrate 25 MG tablet Commonly known as: LOPRESSOR Take 0.5 tablets (12.5 mg total) by mouth 2 (two) times daily.   Nasacort Allergy 24HR 55 MCG/ACT Aero nasal inhaler Generic drug: triamcinolone Place 1 spray into the nose 2 (two) times daily as needed (allergies).   pantoprazole 40 MG tablet Commonly known as: PROTONIX TAKE 1 TABLET BY MOUTH EVERY DAY   PreviDent 5000 Booster Plus 1.1 % Pste Generic drug: Sodium Fluoride Take 1 application by mouth as directed.   rosuvastatin 40 MG tablet Commonly known as: CRESTOR Take 1 tablet (40 mg total) by mouth daily. What changed:  medication strength how much to take   traMADol 50 MG tablet Commonly known as: ULTRAM Take 1 tablet (50 mg total) by mouth every 6 (six) hours as needed for moderate pain. What changed:  how much to take how to take this when to take this reasons to take this additional instructions        Follow-up Information     Lightfoot, Lucile Crater, MD Follow up.   Specialty: Cardiothoracic Surgery Why: Please see discharge paperwork for follow-up appointment with surgeon.  Also obtain a MYCHART ACCOUNT to keep track of all future appointments related to surgery and  cardiology follow-up. Contact information: [address] [place] [address] [phone]         Loel Dubonnet, NP. Go on 05/23/2021.   Specialty: Cardiology Why: Appointment time is at 2:00 pm Contact information: [address] [place] [address] [phone]         Tammi Sou, MD. Call.   Specialty: Family Medicine Why: for further surveillance of HGA1C 5.7 (borderline diabetes) Contact information: [address] [place] [address] [phone]         Sueanne Margarita, MD .   Specialty: Cardiology Contact information: [phone]  [address] [place] [address] [phone]                 Signed: Bricyn Giovanni, PA-C  05/11/2021, 10:27 AM

## 2021-05-07 NOTE — Progress Notes (Signed)
      [address]       [place] [address]             [phone]      POD # 1 CABG x 2  No complaints, denies pain BP (!) 117/58   Pulse 73   Temp 98.3 F (36.8 C) (Oral)   Resp 15   Ht '6\' 2"'$  (1.88 m)   Wt 96.9 kg   SpO2 96%   BMI 27.43 kg/m  2L [place]  Intake/Output Summary (Last 24 hours) at 05/07/2021 1814 Last data filed at 05/07/2021 1600 Gross per 24 hour  Intake 2425.11 ml  Output 1395 ml  Net 1030.11 ml   K= 4.2, creatinine 1.11 Hct 32  Doing well POD # 1  Twanda Stakes C. Roxan Hockey, MD [facility] [phone]

## 2021-05-07 NOTE — Progress Notes (Signed)
[address]       ,[place] [address]             [phone]                 1 Day Post-Op Procedure(s) (LRB): OFF PUMP CORONARY ARTERY BYPASS GRAFTING (CABG) X TWO, USING LEFT INTERNAL MAMMARY ARTERY AND RIGHT LEG GREATER SAPHENOUS VEIN HARVESTED ENDOSCOPICALLY (N/A) TRANSESOPHAGEAL ECHOCARDIOGRAM (TEE) (N/A)   Events: No events. Extubated overnight. _______________________________________________________________ Vitals: BP (!) 117/58   Pulse 73   Temp 98.8 F (37.1 C) (Oral)   Resp 15   Ht '6\' 2"'$  (1.88 m)   Wt 96.9 kg   SpO2 96%   BMI 27.43 kg/m   - Neuro: Alert NAD  - Cardiovascular: Sinus  Drips: Levo at 2.   CVP:  [4 mmHg-16 mmHg] 5 mmHg  - Pulm: Easy work of breathing Serosanguineous chest tube output  ABG    Component Value Date/Time   PHART 7.365 05/06/2021 1907   PCO2ART 36.7 05/06/2021 1907   PO2ART 87 05/06/2021 1907   HCO3 21.1 05/06/2021 1907   TCO2 22 05/06/2021 1907   ACIDBASEDEF 4.0 (H) 05/06/2021 1907   O2SAT 97.0 05/06/2021 1907    - Abd: Nondistended.  Ileal conduit is pink - Extremity: Warm  .Intake/Output      08/09 0701 08/10 0700 08/10 0701 08/11 0700   P.O. 400    I.V. (mL/kg) 3981.1 (41.1)    Blood 560    IV Piggyback 2448.6    Total Intake(mL/kg) 7389.7 (76.3)    Urine (mL/kg/hr) 2305 (1) 125 (0.1)   Blood 908    Chest Tube 320 10   Total Output 3533 135   Net +3856.7 -135           _______________________________________________________________ Labs: CBC Latest Ref Rng & Units 05/07/2021 05/06/2021 05/06/2021  WBC 4.0 - 10.5 K/uL 12.2(H) - 8.2  Hemoglobin 13.0 - 17.0 g/dL 11.2(L) 10.5(L) 11.2(L)  Hematocrit 39.0 - 52.0 % 33.5(L) 31.0(L) 33.7(L)  Platelets 150 - 400 K/uL 141(L) - 103(L)   CMP Latest Ref Rng & Units 05/07/2021 05/06/2021 05/06/2021  Glucose 70 - 99 mg/dL 149(H) - 136(H)  BUN 8 - 23 mg/dL 9 - 8  Creatinine 0.61 - 1.24 mg/dL 1.07 - 0.92  Sodium 135 - 145 mmol/L 138 142 138   Potassium 3.5 - 5.1 mmol/L 4.1 4.0 4.1  Chloride 98 - 111 mmol/L 108 - 109  CO2 22 - 32 mmol/L 23 - 22  Calcium 8.9 - 10.3 mg/dL 8.8(L) - 8.4(L)  Total Protein 6.5 - 8.1 g/dL - - -  Total Bilirubin 0.3 - 1.2 mg/dL - - -  Alkaline Phos 38 - 126 U/L - - -  AST 15 - 41 U/L - - -  ALT 0 - 44 U/L - - -    CXR: Pulmonary vascular congestion  _______________________________________________________________  Assessment and Plan: POD 1 s/p off-pump CABG x2  Neuro: Pain controlled CV: We will wean Levophed.  We will remove arterial line once off Levophed.  Adding Plavix given his presentation with unstable angina and NSTEMI.  I will cut aspirin 81 mg.  Holding beta-blocker while on Levophed.  On statin. Pulm: We will keep chest tubes for now.  Continue pulmonary hygiene Renal: Good urine output.  Ileal conduit is functioning well. GI: Advancing diet Heme: Stable ID: Afebrile Endo: Sliding scale insulin Dispo: Continue ICU care.   Lajuana Matte 05/07/2021 4:02 PM

## 2021-05-08 ENCOUNTER — Inpatient Hospital Stay (HOSPITAL_COMMUNITY): Payer: Medicare HMO

## 2021-05-08 LAB — BASIC METABOLIC PANEL
Anion gap: 5 (ref 5–15)
BUN: 15 mg/dL (ref 8–23)
CO2: 26 mmol/L (ref 22–32)
Calcium: 8.5 mg/dL — ABNORMAL LOW (ref 8.9–10.3)
Chloride: 106 mmol/L (ref 98–111)
Creatinine, Ser: 1.14 mg/dL (ref 0.61–1.24)
GFR, Estimated: >60 mL/min (ref >60)
Glucose, Bld: 116 mg/dL — ABNORMAL HIGH (ref 70–99)
Potassium: 4 mmol/L (ref 3.5–5.1)
Sodium: 137 mmol/L (ref 135–145)

## 2021-05-08 LAB — CBC
HCT: 29 % — ABNORMAL LOW (ref 39.0–52.0)
Hemoglobin: 9.2 g/dL — ABNORMAL LOW (ref 13.0–17.0)
MCH: 30.1 pg (ref 26.0–34.0)
MCHC: 31.7 g/dL (ref 30.0–36.0)
MCV: 94.8 fL (ref 80.0–100.0)
Platelets: 87 10*3/uL — ABNORMAL LOW (ref 150–400)
RBC: 3.06 MIL/uL — ABNORMAL LOW (ref 4.22–5.81)
RDW: 16 % — ABNORMAL HIGH (ref 11.5–15.5)
WBC: 7.5 10*3/uL (ref 4.0–10.5)
nRBC: 0 % (ref 0.0–0.2)

## 2021-05-08 LAB — GLUCOSE, CAPILLARY
Glucose-Capillary: 102 mg/dL — ABNORMAL HIGH (ref 70–99)
Glucose-Capillary: 158 mg/dL — ABNORMAL HIGH (ref 70–99)
Glucose-Capillary: 170 mg/dL — ABNORMAL HIGH (ref 70–99)
Glucose-Capillary: 134 mg/dL — ABNORMAL HIGH (ref 70–99)

## 2021-05-08 MED ORDER — SODIUM CHLORIDE 0.9% FLUSH
3.0000 mL | Freq: Two times a day (BID) | INTRAVENOUS | Status: DC
  Administered 2021-05-08 – 2021-05-11 (×6): 3 mL via INTRAVENOUS

## 2021-05-08 MED ORDER — SODIUM CHLORIDE 0.9 % IV SOLN: 250.0000 mL | INTRAVENOUS | Status: DC | PRN

## 2021-05-08 MED ORDER — ~~LOC~~ CARDIAC SURGERY, PATIENT & FAMILY EDUCATION: Freq: Once | Status: AC

## 2021-05-08 MED ORDER — SODIUM CHLORIDE 0.9% FLUSH: 3.0000 mL | INTRAVENOUS | Status: DC | PRN

## 2021-05-08 NOTE — Progress Notes (Signed)
Progress Note  Patient Name: Brandon Haynes Date of Encounter: 05/08/2021  [facility] Cardiologist: Fransico Him, MD   Subjective   Chest wall sore. No events  Inpatient Medications    Scheduled Meds:  acetaminophen  1,000 mg Oral Q6H   Or   acetaminophen (TYLENOL) oral liquid 160 mg/5 mL  1,000 mg Per Tube Q6H   allopurinol  300 mg Oral Daily   aspirin EC  81 mg Oral Daily   bisacodyl  10 mg Oral Daily   Or   bisacodyl  10 mg Rectal Daily   Chlorhexidine Gluconate Cloth  6 each Topical Daily   clopidogrel  75 mg Oral Daily   docusate sodium  200 mg Oral Daily   enoxaparin (LOVENOX) injection  30 mg Subcutaneous QHS   gabapentin  300 mg Oral BID   insulin aspart  0-24 Units Subcutaneous TID AC & HS   mouth rinse  15 mL Mouth Rinse BID   metoprolol tartrate  12.5 mg Oral BID   Or   metoprolol tartrate  12.5 mg Per Tube BID   mupirocin ointment   Nasal BID   pantoprazole  40 mg Oral Daily   rosuvastatin  40 mg Oral Daily   sodium chloride flush  3 mL Intravenous Q12H   Continuous Infusions:  sodium chloride Stopped (05/07/21 1140)   sodium chloride     sodium chloride     albumin human 12.5 g (05/07/21 1730)    ceFAZolin (ANCEF) IV 2 g (05/07/21 2307)   dexmedetomidine (PRECEDEX) IV infusion Stopped (05/07/21 0044)   DOBUTamine Stopped (05/06/21 1220)   insulin Stopped (05/07/21 1531)   lactated ringers Stopped (05/06/21 1220)   lactated ringers Stopped (05/06/21 1842)   niCARDipine Stopped (05/06/21 1220)   nitroGLYCERIN 0 mcg/min (05/06/21 1220)   norepinephrine (LEVOPHED) Adult infusion Stopped (05/07/21 1653)   PRN Meds: sodium chloride, albumin human, dextrose, metoprolol tartrate, midazolam, morphine injection, ondansetron (ZOFRAN) IV, oxyCODONE, sodium chloride flush, traMADol   Vital Signs    Vitals:   05/08/21 0100 05/08/21 0200 05/08/21 0300 05/08/21 0400  BP: 90/61 90/65 (!) 91/59 106/62  Pulse: 69 67 67 66  Resp: (!) 8 (!) 9 (!) 9 (!) 9   Temp:      TempSrc:      SpO2: 97% 96% 97% 100%  Weight:      Height:        Intake/Output Summary (Last 24 hours) at 05/08/2021 0624 Last data filed at 05/08/2021 0400 Gross per 24 hour  Intake 628.72 ml  Output 845 ml  Net -216.28 ml   Last 3 Weights 05/07/2021 05/06/2021 05/05/2021  Weight (lbs) 213 lb 10 oz 202 lb 13.2 oz 205 lb 4 oz  Weight (kg) 96.9 kg 92 kg 93.1 kg      Telemetry    sinus- Personally Reviewed  ECG    No AM EKG, personally reviewed by me.   Physical Exam   General: Well developed, well nourished, NAD  HEENT: OP clear, mucus membranes moist  SKIN: warm, dry. No rashes. Neuro: No focal deficits  Musculoskeletal: Muscle strength 5/5 all ext  Psychiatric: Mood and affect normal  Neck: No JVD, no carotid bruits, no thyromegaly, no lymphadenopathy.  Lungs:Clear bilaterally, no wheezes, rhonci, crackles Cardiovascular: Regular rate and rhythm. No murmurs, gallops or rubs. Abdomen:Soft. Bowel sounds present. Non-tender.  Extremities: No lower extremity edema. Pulses are 2 + in the bilateral DP/PT.   Labs    High Sensitivity Troponin:  Recent Labs  Lab 04/30/21 1236 04/30/21 1308  TROPONINIHS 5 5      Chemistry Recent Labs  Lab 05/07/21 0312 05/07/21 1703 05/08/21 0412  NA 138 136 137  K 4.1 4.2 4.0  CL 108 107 106  CO2 '23 23 26  '$ GLUCOSE 149* 167* 116*  BUN '9 13 15  '$ CREATININE 1.07 1.11 1.14  CALCIUM 8.8* 8.7* 8.5*  GFRNONAA >60 >60 >60  ANIONGAP '7 6 5     '$ Hematology Recent Labs  Lab 05/06/21 1830 05/06/21 1907 05/07/21 0312 05/07/21 1703  WBC 8.2  --  12.2* 10.0  RBC 3.72*  --  3.73* 3.44*  HGB 11.2* 10.5* 11.2* 10.2*  HCT 33.7* 31.0* 33.5* 31.7*  MCV 90.6  --  89.8 92.2  MCH 30.1  --  30.0 29.7  MCHC 33.2  --  33.4 32.2  RDW 15.0  --  15.4 15.9*  PLT 103*  --  141* PLATELET CLUMPS NOTED ON SMEAR, UNABLE TO ESTIMATE    BNP No results for input(s): BNP, PROBNP in the last 168 hours.    DDimer No results for input(s):  DDIMER in the last 168 hours.   Radiology    DG Chest Port 1 View  Result Date: 05/07/2021 CLINICAL DATA:  Status post CABG. EXAM: PORTABLE CHEST 1 VIEW COMPARISON:  Chest x-ray from yesterday. FINDINGS: Interval extubation and removal of the enteric tube. Unchanged right internal jugular sheath. Unchanged mediastinal drain and left-sided chest tube. Stable cardiomediastinal silhouette status post CABG. Decreased lung volumes with increased mild left greater than right bibasilar atelectasis. No pneumothorax or large pleural effusion. No acute osseous abnormality. IMPRESSION: 1. Interval extubation and removal of the enteric tube. 2. Decreased lung volumes with increased bibasilar atelectasis. Electronically Signed   By: Titus Dubin M.D.   On: 05/07/2021 08:22   DG Chest Port 1 View  Result Date: 05/06/2021 CLINICAL DATA:  Postop CABG. EXAM: PORTABLE CHEST 1 VIEW COMPARISON:  Radiographs 04/30/2021 and 11/18/2020.  CT 02/19/2020. FINDINGS: 1248 hours. Status post median sternotomy and CABG. Tip of the endotracheal tube is in the mid trachea. An enteric tube projects below the diaphragm, tip not visualized. A right IJ sheath projects over the right clavicular head, and there is a femorally placed Swan-Ganz catheter which projects over the left pulmonary artery. The heart size and mediastinal contours are stable. There is aortic atherosclerosis. Mild atelectasis is present at both lung bases. No edema, pneumothorax or significant pleural effusion. IMPRESSION: No demonstrated complication following CABG. Support system as above. Mild bibasilar atelectasis. Electronically Signed   By: Richardean Sale M.D.   On: 05/06/2021 13:16   ECHO INTRAOPERATIVE TEE  Result Date: 05/06/2021  *INTRAOPERATIVE TRANSESOPHAGEAL REPORT *  Patient Name:   Brandon Haynes  Date of Exam: 05/06/2021 Medical Rec #:  [id number]      Height:       74.0 in Accession #:    [id number]     Weight:       202.8 lb Date of Birth:  11-06-1940       BSA:          2.19 m Patient Age:    80 years       BP:           129/82 mmHg Patient Gender: M              HR:           74 bpm. Exam Location:  Anesthesiology Transesophogeal exam  was perform intraoperatively during surgical procedure. Patient was closely monitored under general anesthesia during the entirety of examination. Indications:     CAD Native Vessel i25.1 Sonographer:     Raquel Sarna Senior RDCS Performing Phys: [id number] Lucile Crater LIGHTFOOT Diagnosing Phys: Nolon Nations MD Complications: No known complications during this procedure. POST-OP IMPRESSIONS _ Left Ventricle: The left ventricle is unchanged from pre-bypass. _ Right Ventricle: The right ventricle appears unchanged from pre-bypass. _ Aorta: The aorta appears unchanged from pre-bypass. _ Left Atrium: The left atrium appears unchanged from pre-bypass. _ Aortic Valve: The aortic valve appears unchanged from pre-bypass. _ Mitral Valve: The mitral valve appears unchanged from pre-bypass. _ Tricuspid Valve: The tricuspid valve appears unchanged from pre-bypass. _ Pulmonic Valve: The pulmonic valve appears unchanged from pre-bypass. _ Interventricular Septum: The interventricular septum appears unchanged from pre-bypass. PRE-OP FINDINGS  Left Ventricle: The left ventricle has normal systolic function, with an ejection fraction of 55-60%. The cavity size was normal. There is no increase in left ventricular wall thickness. Right Ventricle: The right ventricle has normal systolic function. The cavity was normal. There is no increase in right ventricular wall thickness. Left Atrium: Left atrial size was dilated. No left atrial/left atrial appendage thrombus was detected. Right Atrium: Right atrial size was normal in size. Interatrial Septum: No atrial level shunt detected by color flow Doppler. Pericardium: There is no evidence of pericardial effusion. Mitral Valve: The mitral valve is normal in structure. Mitral valve regurgitation is trivial by color  flow Doppler. There is no evidence of mitral valve vegetation. Tricuspid Valve: The tricuspid valve was normal in structure. Tricuspid valve regurgitation is trivial by color flow Doppler. There is no evidence of tricuspid valve vegetation. Aortic Valve: The aortic valve is tricuspid Aortic valve regurgitation is mild by color flow Doppler. There is no stenosis of the aortic valve. Pulmonic Valve: The pulmonic valve was abnormal. Pulmonic valve regurgitation is trivial by color flow Doppler. Aorta: There is mild dilatation of the ascending aorta, measuring 42 mm. There is evidence of plaque in the descending aorta; Grade III, measuring 3-7m in size.  JNolon NationsMD Electronically signed by JNolon NationsMD Signature Date/Time: 05/06/2021/1:01:03 PM    Final     Cardiac Studies   Echo 05/01/21:  1. Left ventricular ejection fraction, by estimation, is 55 to 60%. The  left ventricle has normal function. The left ventricle has no regional  wall motion abnormalities. Left ventricular diastolic parameters are  consistent with Grade I diastolic  dysfunction (impaired relaxation).   2. Right ventricular systolic function is normal. The right ventricular  size is normal. Tricuspid regurgitation signal is inadequate for assessing  PA pressure.   3. Left atrial size was mildly dilated.   4. The mitral valve is normal in structure. Trivial mitral valve  regurgitation. No evidence of mitral stenosis.   5. The aortic valve is tricuspid. Aortic valve regurgitation is mild.  Mild aortic valve sclerosis is present, with no evidence of aortic valve  stenosis.   6. Aortic dilatation noted. There is borderline dilatation of the aortic  root, measuring 37 mm.   7. The inferior vena cava is normal in size with greater than 50%  respiratory variability, suggesting right atrial pressure of 3 mmHg.   Cardiac catheterization (05/01/2021)   Conclusion       Prox RCA-1 lesion is 50% stenosed.   Prox RCA-2 lesion  is 50% stenosed.   Mid RCA lesion is 100% stenosed.   Prox Cx lesion  is 30% stenosed.   Mid Cx to Dist Cx lesion is 50% stenosed.   Mid LAD-2 lesion is 20% stenosed.   Mid LAD-1 lesion is 99% stenosed.   Dist LAD lesion is 30% stenosed.   The left ventricular systolic function is normal.   LV end diastolic pressure is normal.   The left ventricular ejection fraction is 55-65% by visual estimate.   There is no mitral valve regurgitation.   Severe heavily calcified mid LAD stenosis. Patent mid LAD stent with minimal restenosis. Mild to moderate Circumflex stenosis Chronic occlusion mid RCA. The distal branches fill briskly from left to right collaterals Preserved LV systolic function Unsuccessful attempt at PCI of the mid LAD lesion as I was unable to cross the lesion with a wire.   Recommendations: Will consult CT surgery for CABG as PCI was not successful despite a long attempt at crossing the lesion. He was loaded with Plavix 600 mg this am in anticipation of PCI. He will need Plavix washout before surgery. Coronary Diagrams     Diagnostic Dominance: Right       Patient Profile     80 y.o. male with a history of CAD s/p LAD stent and occluded RCA with collaterals, hypertension, hyperlipidemia, chronic pain syndrome, bladder cancer s/p TURBT/chemotherapy/radical cystectomy and lymphadenectomy 12/13/19, gout, who was admitted with unstable angina, dyspnea, fatigue and syncope. Cardiac cath with severe three vessel CAD.  2V CABG on 05/06/21.   Assessment & Plan    CAD with unstable angina: Cardiac cath with three vessel CAD. He is now s/p 2V CABG. No issues overnight. Continue ASA, Plavix, statin and beta blocker.     HTN: BP is stable.    For questions or updates, please contact [facility] Please consult [email or web address] for contact info under        Signed, Lauree Chandler, MD  05/08/2021, 6:24 AM

## 2021-05-08 NOTE — Progress Notes (Signed)
CARDIAC REHAB PHASE I   PRE:  Rate/Rhythm: 83 SR    BP: lying 108/76    SaO2: 96 RA  MODE:  Ambulation: 740 ft   POST:  Rate/Rhythm: 102 ST with PACs    BP: sitting 124/71     SaO2: 98 RA  Pt moving well. Likes EVA. Ambulated at quick pace. C/o SOB and some chest soreness. To recliner, although reluctant (c/o back pain). VSS. Encouraged another walk later. Will "graduate" to RW or no AD tomorrow.  Brandon Haynes, ACSM 05/08/2021 1:43 PM

## 2021-05-08 NOTE — Progress Notes (Signed)
      [address]       [place] [address]             [phone]                 2 Days Post-Op Procedure(s) (LRB): OFF PUMP CORONARY ARTERY BYPASS GRAFTING (CABG) X TWO, USING LEFT INTERNAL MAMMARY ARTERY AND RIGHT LEG GREATER SAPHENOUS VEIN HARVESTED ENDOSCOPICALLY (N/A) TRANSESOPHAGEAL ECHOCARDIOGRAM (TEE) (N/A)   Events: No events.  _______________________________________________________________ Vitals: BP 112/69   Pulse 84   Temp 98.7 F (37.1 C) (Oral)   Resp 14   Ht '6\' 2"'$  (1.88 m)   Wt 98.2 kg   SpO2 98%   BMI 27.80 kg/m   - Neuro: Alert NAD  - Cardiovascular: Sinus  Drips: none CVP:  [2 mmHg-49 mmHg] 12 mmHg  - Pulm: Easy work of breathing Serosanguineous chest tube output  ABG    Component Value Date/Time   PHART 7.365 05/06/2021 1907   PCO2ART 36.7 05/06/2021 1907   PO2ART 87 05/06/2021 1907   HCO3 21.1 05/06/2021 1907   TCO2 22 05/06/2021 1907   ACIDBASEDEF 4.0 (H) 05/06/2021 1907   O2SAT 97.0 05/06/2021 1907    - Abd: Nondistended.  Ileal conduit is pink - Extremity: Warm  .Intake/Output      08/10 0701 08/11 0700 08/11 0701 08/12 0700   P.O.     I.V. (mL/kg) 395.1 (4)    Blood     IV Piggyback 100    Total Intake(mL/kg) 495.1 (5)    Urine (mL/kg/hr) 1205 (0.5)    Blood     Chest Tube 90    Total Output 1295    Net -799.9            _______________________________________________________________ Labs: CBC Latest Ref Rng & Units 05/08/2021 05/07/2021 05/07/2021  WBC 4.0 - 10.5 K/uL 7.5 10.0 12.2(H)  Hemoglobin 13.0 - 17.0 g/dL 9.2(L) 10.2(L) 11.2(L)  Hematocrit 39.0 - 52.0 % 29.0(L) 31.7(L) 33.5(L)  Platelets 150 - 400 K/uL 87(L) PLATELET CLUMPS NOTED ON SMEAR, UNABLE TO ESTIMATE 141(L)   CMP Latest Ref Rng & Units 05/08/2021 05/07/2021 05/07/2021  Glucose 70 - 99 mg/dL 116(H) 167(H) 149(H)  BUN 8 - 23 mg/dL '15 13 9  '$ Creatinine 0.61 - 1.24 mg/dL 1.14 1.11 1.07  Sodium 135 - 145 mmol/L 137 136 138  Potassium  3.5 - 5.1 mmol/L 4.0 4.2 4.1  Chloride 98 - 111 mmol/L 106 107 108  CO2 22 - 32 mmol/L '26 23 23  '$ Calcium 8.9 - 10.3 mg/dL 8.5(L) 8.7(L) 8.8(L)  Total Protein 6.5 - 8.1 g/dL - - -  Total Bilirubin 0.3 - 1.2 mg/dL - - -  Alkaline Phos 38 - 126 U/L - - -  AST 15 - 41 U/L - - -  ALT 0 - 44 U/L - - -    CXR: Pulmonary vascular congestion  _______________________________________________________________  Assessment and Plan: POD 2 s/p off-pump CABG x2  Neuro: Pain controlled CV: On Plavix given his presentation with unstable angina and NSTEMI.  I will cut aspirin 81 mg.  BB today.  On statin. Pulm: We will remove chest tubes.  Continue pulmonary hygiene Renal: Good urine output.  Ileal conduit is functioning well. GI: on diet Heme: Stable ID: [email or web address] Endo: Sliding scale insulin Dispo: floor.   Lajuana Matte 05/08/2021 9:02 AM

## 2021-05-09 LAB — TYPE AND SCREEN
ABO/RH(D): A NEG
Antibody Screen: NEGATIVE
Unit division: 0
Unit division: 0

## 2021-05-09 LAB — CBC
HCT: 33.1 % — ABNORMAL LOW (ref 39.0–52.0)
Hemoglobin: 10.3 g/dL — ABNORMAL LOW (ref 13.0–17.0)
MCH: 29.6 pg (ref 26.0–34.0)
MCHC: 31.1 g/dL (ref 30.0–36.0)
MCV: 95.1 fL (ref 80.0–100.0)
Platelets: 111 10*3/uL — ABNORMAL LOW (ref 150–400)
RBC: 3.48 MIL/uL — ABNORMAL LOW (ref 4.22–5.81)
RDW: 15.8 % — ABNORMAL HIGH (ref 11.5–15.5)
WBC: 8.1 10*3/uL (ref 4.0–10.5)
nRBC: 0 % (ref 0.0–0.2)

## 2021-05-09 LAB — BASIC METABOLIC PANEL
Anion gap: 8 (ref 5–15)
BUN: 16 mg/dL (ref 8–23)
CO2: 25 mmol/L (ref 22–32)
Calcium: 8.6 mg/dL — ABNORMAL LOW (ref 8.9–10.3)
Chloride: 101 mmol/L (ref 98–111)
Creatinine, Ser: 1.03 mg/dL (ref 0.61–1.24)
GFR, Estimated: >60 mL/min (ref >60)
Glucose, Bld: 99 mg/dL (ref 70–99)
Potassium: 4.3 mmol/L (ref 3.5–5.1)
Sodium: 134 mmol/L — ABNORMAL LOW (ref 135–145)

## 2021-05-09 LAB — BPAM RBC
Blood Product Expiration Date: 202208292359
Blood Product Expiration Date: 202208292359
Unit Type and Rh: 600
Unit Type and Rh: 600

## 2021-05-09 LAB — GLUCOSE, CAPILLARY
Glucose-Capillary: 93 mg/dL (ref 70–99)
Glucose-Capillary: 117 mg/dL — ABNORMAL HIGH (ref 70–99)
Glucose-Capillary: 97 mg/dL (ref 70–99)
Glucose-Capillary: 112 mg/dL — ABNORMAL HIGH (ref 70–99)

## 2021-05-09 MED ORDER — MIDODRINE HCL 5 MG PO TABS
5.0000 mg | ORAL_TABLET | Freq: Three times a day (TID) | ORAL | Status: DC
  Administered 2021-05-09 – 2021-05-11 (×6): 5 mg via ORAL
  Filled 2021-05-09 (×6): qty 1

## 2021-05-09 NOTE — Progress Notes (Signed)
      [address]       [place] [address]             [phone]                 3 Days Post-Op Procedure(s) (LRB): OFF PUMP CORONARY ARTERY BYPASS GRAFTING (CABG) X TWO, USING LEFT INTERNAL MAMMARY ARTERY AND RIGHT LEG GREATER SAPHENOUS VEIN HARVESTED ENDOSCOPICALLY (N/A) TRANSESOPHAGEAL ECHOCARDIOGRAM (TEE) (N/A)   Events: No events.  _______________________________________________________________ Vitals: BP 103/63   Pulse 80   Temp 98.5 F (36.9 C) (Oral)   Resp 10   Ht '6\' 2"'$  (1.88 m)   Wt 98.3 kg   SpO2 99%   BMI 27.82 kg/m   - Neuro: Alert NAD  - Cardiovascular: Sinus  Drips: none    - Pulm: Easy work of breathing Serosanguineous chest tube output  ABG    Component Value Date/Time   PHART 7.365 05/06/2021 1907   PCO2ART 36.7 05/06/2021 1907   PO2ART 87 05/06/2021 1907   HCO3 21.1 05/06/2021 1907   TCO2 22 05/06/2021 1907   ACIDBASEDEF 4.0 (H) 05/06/2021 1907   O2SAT 97.0 05/06/2021 1907    - Abd: Nondistended.  Ileal conduit is pink - Extremity: Warm  .Intake/Output      08/12 0701 08/13 0700   P.O. 360   I.V. (mL/kg)    Total Intake(mL/kg) 360 (3.7)   Urine (mL/kg/hr) 865 (0.7)   Chest Tube    Total Output 865   Net -505          _______________________________________________________________ Labs: CBC Latest Ref Rng & Units 05/09/2021 05/08/2021 05/07/2021  WBC 4.0 - 10.5 K/uL 8.1 7.5 10.0  Hemoglobin 13.0 - 17.0 g/dL 10.3(L) 9.2(L) 10.2(L)  Hematocrit 39.0 - 52.0 % 33.1(L) 29.0(L) 31.7(L)  Platelets 150 - 400 K/uL 111(L) 87(L) PLATELET CLUMPS NOTED ON SMEAR, UNABLE TO ESTIMATE   CMP Latest Ref Rng & Units 05/09/2021 05/08/2021 05/07/2021  Glucose 70 - 99 mg/dL 99 116(H) 167(H)  BUN 8 - 23 mg/dL '16 15 13  '$ Creatinine 0.61 - 1.24 mg/dL 1.03 1.14 1.11  Sodium 135 - 145 mmol/L 134(L) 137 136  Potassium 3.5 - 5.1 mmol/L 4.3 4.0 4.2  Chloride 98 - 111 mmol/L 101 106 107  CO2 22 - 32 mmol/L '25 26 23  '$ Calcium 8.9 - 10.3  mg/dL 8.6(L) 8.5(L) 8.7(L)  Total Protein 6.5 - 8.1 g/dL - - -  Total Bilirubin 0.3 - 1.2 mg/dL - - -  Alkaline Phos 38 - 126 U/L - - -  AST 15 - 41 U/L - - -  ALT 0 - 44 U/L - - -    CXR: Pulmonary vascular congestion  _______________________________________________________________  Assessment and Plan: POD 2 s/p off-pump CABG x2  Neuro: Pain controlled CV: On Plavix given his presentation with unstable angina and NSTEMI.  I will cut aspirin 81 mg.  BB today.  On statin. Pulm: Continue pulmonary hygiene Renal: Good urine output.  Ileal conduit is functioning well. GI: on diet Heme: Stable ID: [email or web address] Endo: Sliding scale insulin Dispo: floor.   Lajuana Matte 05/09/2021 7:02 PM

## 2021-05-09 NOTE — Progress Notes (Signed)
Progress Note  Patient Name: Brandon Haynes Date of Encounter: 05/09/2021  [facility] Cardiologist: Fransico Him, MD   Subjective   No complaints  Inpatient Medications    Scheduled Meds:  acetaminophen  1,000 mg Oral Q6H   Or   acetaminophen (TYLENOL) oral liquid 160 mg/5 mL  1,000 mg Per Tube Q6H   allopurinol  300 mg Oral Daily   aspirin EC  81 mg Oral Daily   bisacodyl  10 mg Oral Daily   Or   bisacodyl  10 mg Rectal Daily   Chlorhexidine Gluconate Cloth  6 each Topical Daily   clopidogrel  75 mg Oral Daily   docusate sodium  200 mg Oral Daily   enoxaparin (LOVENOX) injection  30 mg Subcutaneous QHS   gabapentin  300 mg Oral BID   insulin aspart  0-24 Units Subcutaneous TID AC & HS   mouth rinse  15 mL Mouth Rinse BID   metoprolol tartrate  12.5 mg Oral BID   Or   metoprolol tartrate  12.5 mg Per Tube BID   mupirocin ointment   Nasal BID   pantoprazole  40 mg Oral Daily   rosuvastatin  40 mg Oral Daily   sodium chloride flush  3 mL Intravenous Q12H   sodium chloride flush  3 mL Intravenous Q12H   Continuous Infusions:  sodium chloride Stopped (05/07/21 1140)   sodium chloride     sodium chloride     sodium chloride     albumin human 12.5 g (05/07/21 1730)   lactated ringers Stopped (05/06/21 1220)   lactated ringers Stopped (05/06/21 1842)   PRN Meds: sodium chloride, sodium chloride, albumin human, metoprolol tartrate, midazolam, morphine injection, ondansetron (ZOFRAN) IV, oxyCODONE, sodium chloride flush, sodium chloride flush, traMADol   Vital Signs    Vitals:   05/09/21 0500 05/09/21 0600 05/09/21 0700 05/09/21 0800  BP: 95/61 107/69 (!) 90/59 (!) 95/55  Pulse: 76  78 74  Resp: '11 12 10 '$ (!) 9  Temp:  98.4 F (36.9 C)    TempSrc:  Oral    SpO2: 91%  94% 93%  Weight: 98.3 kg     Height:        Intake/Output Summary (Last 24 hours) at 05/09/2021 0905 Last data filed at 05/09/2021 0800 Gross per 24 hour  Intake 840 ml  Output 1135 ml  Net  -295 ml   Last 3 Weights 05/09/2021 05/08/2021 05/07/2021  Weight (lbs) 216 lb 11.4 oz 216 lb 7.9 oz 213 lb 10 oz  Weight (kg) 98.3 kg 98.2 kg 96.9 kg      Telemetry    NSR- Personally Reviewed  ECG    No AM EKG, personally reviewed by me.   Physical Exam    General: Well developed, well nourished, NAD  HEENT: OP clear, mucus membranes moist  SKIN: warm, dry. No rashes. Neuro: No focal deficits  Musculoskeletal: Muscle strength 5/5 all ext  Psychiatric: Mood and affect normal  Neck: No JVD, no carotid bruits, no thyromegaly, no lymphadenopathy.  Lungs:Clear bilaterally, no wheezes, rhonci, crackles Cardiovascular: Regular rate and rhythm. No murmurs, gallops or rubs. Abdomen:Soft. Bowel sounds present. Non-tender.  Extremities: No lower extremity edema.     Labs    High Sensitivity Troponin:   Recent Labs  Lab 04/30/21 1236 04/30/21 1308  TROPONINIHS 5 5      Chemistry Recent Labs  Lab 05/07/21 1703 05/08/21 0412 05/09/21 0046  NA 136 137 134*  K 4.2 4.0  4.3  CL 107 106 101  CO2 '23 26 25  '$ GLUCOSE 167* 116* 99  BUN '13 15 16  '$ CREATININE 1.11 1.14 1.03  CALCIUM 8.7* 8.5* 8.6*  GFRNONAA >60 >60 >60  ANIONGAP '6 5 8     '$ Hematology Recent Labs  Lab 05/07/21 1703 05/08/21 0412 05/09/21 0046  WBC 10.0 7.5 8.1  RBC 3.44* 3.06* 3.48*  HGB 10.2* 9.2* 10.3*  HCT 31.7* 29.0* 33.1*  MCV 92.2 94.8 95.1  MCH 29.7 30.1 29.6  MCHC 32.2 31.7 31.1  RDW 15.9* 16.0* 15.8*  PLT PLATELET CLUMPS NOTED ON SMEAR, UNABLE TO ESTIMATE 87* 111*    BNP No results for input(s): BNP, PROBNP in the last 168 hours.    DDimer No results for input(s): DDIMER in the last 168 hours.   Radiology    DG Chest Port 1 View  Result Date: 05/08/2021 CLINICAL DATA:  Status post CABG. EXAM: PORTABLE CHEST 1 VIEW COMPARISON:  05/07/2021 FINDINGS: Stable heart size. Mediastinal drain remains. Improved aeration at both lung bases with some residual mild bibasilar atelectasis remaining.  No pneumothorax. Probable trace/small left pleural effusion. No pulmonary edema. IMPRESSION: Improved aeration of both lung bases with mild residual bibasilar atelectasis. Probable trace left pleural effusion. Electronically Signed   By: Aletta Edouard M.D.   On: 05/08/2021 08:47    Cardiac Studies   Echo 05/01/21:  1. Left ventricular ejection fraction, by estimation, is 55 to 60%. The  left ventricle has normal function. The left ventricle has no regional  wall motion abnormalities. Left ventricular diastolic parameters are  consistent with Grade I diastolic  dysfunction (impaired relaxation).   2. Right ventricular systolic function is normal. The right ventricular  size is normal. Tricuspid regurgitation signal is inadequate for assessing  PA pressure.   3. Left atrial size was mildly dilated.   4. The mitral valve is normal in structure. Trivial mitral valve  regurgitation. No evidence of mitral stenosis.   5. The aortic valve is tricuspid. Aortic valve regurgitation is mild.  Mild aortic valve sclerosis is present, with no evidence of aortic valve  stenosis.   6. Aortic dilatation noted. There is borderline dilatation of the aortic  root, measuring 37 mm.   7. The inferior vena cava is normal in size with greater than 50%  respiratory variability, suggesting right atrial pressure of 3 mmHg.   Cardiac catheterization (05/01/2021)   Conclusion       Prox RCA-1 lesion is 50% stenosed.   Prox RCA-2 lesion is 50% stenosed.   Mid RCA lesion is 100% stenosed.   Prox Cx lesion is 30% stenosed.   Mid Cx to Dist Cx lesion is 50% stenosed.   Mid LAD-2 lesion is 20% stenosed.   Mid LAD-1 lesion is 99% stenosed.   Dist LAD lesion is 30% stenosed.   The left ventricular systolic function is normal.   LV end diastolic pressure is normal.   The left ventricular ejection fraction is 55-65% by visual estimate.   There is no mitral valve regurgitation.   Severe heavily calcified mid LAD  stenosis. Patent mid LAD stent with minimal restenosis. Mild to moderate Circumflex stenosis Chronic occlusion mid RCA. The distal branches fill briskly from left to right collaterals Preserved LV systolic function Unsuccessful attempt at PCI of the mid LAD lesion as I was unable to cross the lesion with a wire.   Recommendations: Will consult CT surgery for CABG as PCI was not successful despite a long attempt  at crossing the lesion. He was loaded with Plavix 600 mg this am in anticipation of PCI. He will need Plavix washout before surgery. Coronary Diagrams     Diagnostic Dominance: Right       Patient Profile     80 y.o. male with a history of CAD s/p LAD stent and occluded RCA with collaterals, hypertension, hyperlipidemia, chronic pain syndrome, bladder cancer s/p TURBT/chemotherapy/radical cystectomy and lymphadenectomy 12/13/19, gout, who was admitted with unstable angina, dyspnea, fatigue and syncope. Cardiac cath with severe three vessel CAD.  2V CABG on 05/06/21.   Assessment & Plan    CAD with unstable angina: Cardiac cath with three vessel CAD. He is now s/p 2V CABG. He continues to do well. No chest pain. No issues overnight. Continue Plavix/ASA, statin and beta blocker.      HTN: BP is stable today.    For questions or updates, please contact [place] Please consult [email or web address] for contact info under        Signed, Lauree Chandler, MD  05/09/2021, 9:05 AM

## 2021-05-09 NOTE — Discharge Instructions (Addendum)

## 2021-05-09 NOTE — Progress Notes (Signed)
CARDIAC REHAB PHASE I   PRE:  Rate/Rhythm: 79 SR  BP:  Supine:   Sitting: 102/60  Standing:    SaO2: 96%RA  MODE:  Ambulation: 740 ft   POST:  Rate/Rhythm: 96 R  BP:  Supine:   Sitting: 117/81  Standing:    SaO2: 98%RA 1116-1146 Pt walked 740 ft on RA with rolling walker and gait belt use with minimal asst. Gait steady and tolerated well. To recliner for lunch. Reinforced sternal precautions.   Graylon Good, RN BSN  05/09/2021 11:44 AM

## 2021-05-09 NOTE — Plan of Care (Signed)
  Problem: Education: Goal: Knowledge of General Education information will improve Description: Including pain rating scale, medication(s)/side effects and non-pharmacologic comfort measures Outcome: Progressing   Problem: Health Behavior/Discharge Planning: Goal: Ability to manage health-related needs will improve Outcome: Progressing   Problem: Clinical Measurements: Goal: Ability to maintain clinical measurements within normal limits will improve Outcome: Progressing Goal: Will remain free from infection Outcome: Progressing Goal: Diagnostic test results will improve Outcome: Progressing Goal: Respiratory complications will improve Outcome: Progressing Goal: Cardiovascular complication will be avoided Outcome: Progressing   Problem: Activity: Goal: Risk for activity intolerance will decrease Outcome: Progressing   Problem: Coping: Goal: Level of anxiety will decrease Outcome: Progressing   Problem: Elimination: Goal: Will not experience complications related to bowel motility Outcome: Progressing Goal: Will not experience complications related to urinary retention Outcome: Progressing   Problem: Pain Managment: Goal: General experience of comfort will improve Outcome: Progressing   Problem: Safety: Goal: Ability to remain free from injury will improve Outcome: Progressing   Problem: Skin Integrity: Goal: Risk for impaired skin integrity will decrease Outcome: Progressing   Problem: Education: Goal: Will demonstrate proper wound care and an understanding of methods to prevent future damage Outcome: Progressing Goal: Knowledge of disease or condition will improve Outcome: Progressing Goal: Knowledge of the prescribed therapeutic regimen will improve Outcome: Progressing Goal: Individualized Educational Video(s) Outcome: Progressing

## 2021-05-10 LAB — GLUCOSE, CAPILLARY
Glucose-Capillary: 102 mg/dL — ABNORMAL HIGH (ref 70–99)
Glucose-Capillary: 109 mg/dL — ABNORMAL HIGH (ref 70–99)
Glucose-Capillary: 105 mg/dL — ABNORMAL HIGH (ref 70–99)
Glucose-Capillary: 124 mg/dL — ABNORMAL HIGH (ref 70–99)

## 2021-05-10 MED ORDER — ROSUVASTATIN CALCIUM 40 MG PO TABS: 40.0000 mg | ORAL_TABLET | Freq: Every day | ORAL | 1 refills | Status: DC

## 2021-05-10 MED ORDER — METOPROLOL TARTRATE 25 MG PO TABS
25.0000 mg | ORAL_TABLET | Freq: Two times a day (BID) | ORAL | Status: DC
  Administered 2021-05-10: 25 mg via ORAL
  Filled 2021-05-10 (×2): qty 1

## 2021-05-10 MED ORDER — CLOPIDOGREL BISULFATE 75 MG PO TABS: 75.0000 mg | ORAL_TABLET | Freq: Every day | ORAL | 1 refills | Status: DC

## 2021-05-10 NOTE — Progress Notes (Signed)
Pt received OOB in chair with wife and daughter at side. Encouraged patient to ambulate with me but he voiced that he had already walked this morning and refused walk. Provided education to patient and family on post-op OHS care, D/C education. Reviewed the concept of "move in the tube" and reviewed weight restriction of 5-10 lbs. Pt provided with activity instructions for walking at home. Reviewed precautions and S/S that require them to notify MD. Daily weights stressed and reviewed S/S of fluid overload. Encouraged use of I/S and pt archived 1500 mL. Provided handouts on, and discussed,  heart healthy diet, low na diet. Spouse will be at home to help care for patient after discharge. Pt is anxious to go home but needs to have BM before D/C. Pt will be referred to [facility] for the CRP2 program. Pt and family verbalized understanding of the education provided and all questions were answered.   Lesly Rubenstein MS, ACSM-CEP, CCRP   10:35 - 11:37

## 2021-05-10 NOTE — Progress Notes (Signed)
      [address]       [place], [address]             [phone]                 4 Days Post-Op Procedure(s) (LRB): OFF PUMP CORONARY ARTERY BYPASS GRAFTING (CABG) X TWO, USING LEFT INTERNAL MAMMARY ARTERY AND RIGHT LEG GREATER SAPHENOUS VEIN HARVESTED ENDOSCOPICALLY (N/A) TRANSESOPHAGEAL ECHOCARDIOGRAM (TEE) (N/A)   Events: No events. Ambulating well No BM, + flatus  _______________________________________________________________ Vitals: BP 123/71   Pulse 78   Temp 99.3 F (37.4 C) (Oral)   Resp 15   Ht '6\' 2"'$  (1.88 m)   Wt 97.4 kg   SpO2 95%   BMI 27.57 kg/m   - Neuro: Alert NAD  - Cardiovascular: Sinus  Drips: none    - Pulm: Easy work of breathing   ABG    Component Value Date/Time   PHART 7.365 05/06/2021 1907   PCO2ART 36.7 05/06/2021 1907   PO2ART 87 05/06/2021 1907   HCO3 21.1 05/06/2021 1907   TCO2 22 05/06/2021 1907   ACIDBASEDEF 4.0 (H) 05/06/2021 1907   O2SAT 97.0 05/06/2021 1907    - Abd: Nondistended.  Ileal conduit is pink - Extremity: Warm  .Intake/Output      08/12 0701 08/13 0700 08/13 0701 08/14 0700   P.O. 460 240   I.V. (mL/kg)     Total Intake(mL/kg) 460 (4.7) 240 (2.5)   Urine (mL/kg/hr) 1590 (0.7) 365 (1.1)   Chest Tube     Total Output 1590 365   Net -1130 -125           _______________________________________________________________ Labs: CBC Latest Ref Rng & Units 05/09/2021 05/08/2021 05/07/2021  WBC 4.0 - 10.5 K/uL 8.1 7.5 10.0  Hemoglobin 13.0 - 17.0 g/dL 10.3(L) 9.2(L) 10.2(L)  Hematocrit 39.0 - 52.0 % 33.1(L) 29.0(L) 31.7(L)  Platelets 150 - 400 K/uL 111(L) 87(L) PLATELET CLUMPS NOTED ON SMEAR, UNABLE TO ESTIMATE   CMP Latest Ref Rng & Units 05/09/2021 05/08/2021 05/07/2021  Glucose 70 - 99 mg/dL 99 116(H) 167(H)  BUN 8 - 23 mg/dL '16 15 13  '$ Creatinine 0.61 - 1.24 mg/dL 1.03 1.14 1.11  Sodium 135 - 145 mmol/L 134(L) 137 136  Potassium 3.5 - 5.1 mmol/L 4.3 4.0 4.2  Chloride 98 - 111 mmol/L  101 106 107  CO2 22 - 32 mmol/L '25 26 23  '$ Calcium 8.9 - 10.3 mg/dL 8.6(L) 8.5(L) 8.7(L)  Total Protein 6.5 - 8.1 g/dL - - -  Total Bilirubin 0.3 - 1.2 mg/dL - - -  Alkaline Phos 38 - 126 U/L - - -  AST 15 - 41 U/L - - -  ALT 0 - 44 U/L - - -    CXR: -  _______________________________________________________________  Assessment and Plan: POD 4 s/p off-pump CABG x2  Neuro: Pain controlled CV: On Plavix given his presentation with unstable angina and NSTEMI.  I will cut aspirin 81 mg.  BB today.  On statin. Pulm: Continue pulmonary hygiene Renal: Good urine output.  Ileal conduit is functioning well. GI: on diet Heme: Stable ID: Afebrile Endo: Sliding scale insulin Dispo: awaiting floor bed.  Home tomorrow.  Needs to have a BM   Brandon Haynes O Brandon Haynes 05/10/2021 10:18 AM

## 2021-05-11 LAB — GLUCOSE, CAPILLARY
Glucose-Capillary: 99 mg/dL (ref 70–99)
Glucose-Capillary: 105 mg/dL — ABNORMAL HIGH (ref 70–99)

## 2021-05-11 MED ORDER — TRAMADOL HCL 50 MG PO TABS: 50.0000 mg | ORAL_TABLET | Freq: Four times a day (QID) | ORAL | 0 refills | Status: DC | PRN

## 2021-05-11 MED ORDER — SORBITOL 70 % SOLN
30.0000 mL | Freq: Every day | Status: DC | PRN
  Administered 2021-05-11: 30 mL via ORAL
  Filled 2021-05-11: qty 30

## 2021-05-11 MED ORDER — LACTULOSE 10 GM/15ML PO SOLN
20.0000 g | Freq: Once | ORAL | Status: AC
  Administered 2021-05-11: 20 g via ORAL
  Filled 2021-05-11: qty 30

## 2021-05-11 MED ORDER — METOPROLOL TARTRATE 12.5 MG HALF TABLET
12.5000 mg | ORAL_TABLET | Freq: Two times a day (BID) | ORAL | Status: DC
  Administered 2021-05-11: 12.5 mg via ORAL

## 2021-05-11 NOTE — Progress Notes (Signed)
Pt discharged home with wife and daughter. Discharge documentation reviewed at bedside including changes in medications, hygiene (showering, etc.), follow-up appointments, nutrition, etc. This RN wheeled pt to exit and placed in private vehicle at [place]. No events noted.

## 2021-05-11 NOTE — Progress Notes (Signed)
5 Days Post-Op Procedure(s) (LRB): OFF PUMP CORONARY ARTERY BYPASS GRAFTING (CABG) X TWO, USING LEFT INTERNAL MAMMARY ARTERY AND RIGHT LEG GREATER SAPHENOUS VEIN HARVESTED ENDOSCOPICALLY (N/A) TRANSESOPHAGEAL ECHOCARDIOGRAM (TEE) (N/A) Subjective: No complaints. Passing flatus but no BM yet. Ambulating well.  Objective: Vital signs in last 24 hours: Temp:  [98 F (36.7 C)-98.6 F (37 C)] 98.4 F (36.9 C) (08/14 0645) Pulse Rate:  [66-81] 68 (08/14 0600) Cardiac Rhythm: Normal sinus rhythm (08/14 0800) Resp:  [9-19] 12 (08/14 0600) BP: (94-138)/(49-78) 138/78 (08/14 0600) SpO2:  [90 %-98 %] 95 % (08/14 0600) Weight:  [96.8 kg] 96.8 kg (08/14 0500)  Hemodynamic parameters for last 24 hours:    Intake/Output from previous day: 08/13 0701 - 08/14 0700 In: 240 [P.O.:240] Out: 1590 [Urine:1590] Intake/Output this shift: No intake/output data recorded.  General appearance: alert and cooperative Neurologic: intact Heart: regular rate and rhythm Lungs: clear to auscultation bilaterally Abdomen: soft, non-tender; bowel sounds normal Extremities:no edema Wound: incision ok  Lab Results: Recent Labs    05/09/21 0046  WBC 8.1  HGB 10.3*  HCT 33.1*  PLT 111*   BMET:  Recent Labs    05/09/21 0046  NA 134*  K 4.3  CL 101  CO2 25  GLUCOSE 99  BUN 16  CREATININE 1.03  CALCIUM 8.6*    PT/INR: No results for input(s): LABPROT, INR in the last 72 hours. ABG    Component Value Date/Time   PHART 7.365 05/06/2021 1907   HCO3 21.1 05/06/2021 1907   TCO2 22 05/06/2021 1907   ACIDBASEDEF 4.0 (H) 05/06/2021 1907   O2SAT 97.0 05/06/2021 1907   CBG (last 3)  Recent Labs    05/10/21 1542 05/10/21 2052 05/11/21 0648  GLUCAP 105* 124* 99    Assessment/Plan: S/P Procedure(s) (LRB): OFF PUMP CORONARY ARTERY BYPASS GRAFTING (CABG) X TWO, USING LEFT INTERNAL MAMMARY ARTERY AND RIGHT LEG GREATER SAPHENOUS VEIN HARVESTED ENDOSCOPICALLY (N/A) TRANSESOPHAGEAL ECHOCARDIOGRAM  (TEE) (N/A)  POD 5  Hemodynamically stable in sinus rhythm on midodrine 5 tid. Will decrease Lopressor to 12.5 bid and stop midodrine.  No BM yet so will give lactulose this am.   Wt is 10 lbs over his baseline if accurate but he has no edema. Will probably drop some with BM.   Glucose under good control and no DM hx, nl Hgb A1c. DC SSI.  Plan home later today if his bowels move. He is in agreement.   LOS: 10 days    Gaye Pollack 05/11/2021

## 2021-05-13 ENCOUNTER — Encounter: Payer: Self-pay | Admitting: Family Medicine

## 2021-05-15 ENCOUNTER — Telehealth: Payer: Self-pay

## 2021-05-15 MED ORDER — ALLOPURINOL 300 MG PO TABS: 300.0000 mg | ORAL_TABLET | Freq: Every day | ORAL | 3 refills | Status: DC

## 2021-05-15 NOTE — Telephone Encounter (Signed)
Spoke with pt's wife  Linda(DPR) regarding med request, he is already taking the allopurinol as prescribed but still having an issue with gout. He woke up this morning with inflammation and unable to walk. He is supposed to be walking 3-4 times a day as recommended by cardiologist. They would like to know if colchicine would be more helpful.   Please review and advise

## 2021-05-15 NOTE — Telephone Encounter (Signed)
RF request for allopurinol LOV: 04/30/21 Next ov: N/A Last written:08/05/20(90,3)  Please review and advise if refill appropriate or something else needed

## 2021-05-15 NOTE — Telephone Encounter (Signed)
Gout flare up - Patient recently has had open heart surgery. Requesting meds to treat gout.  Patient can be reached at [phone].

## 2021-05-16 ENCOUNTER — Other Ambulatory Visit: Payer: Self-pay | Admitting: Physician Assistant

## 2021-05-16 ENCOUNTER — Telehealth (INDEPENDENT_AMBULATORY_CARE_PROVIDER_SITE_OTHER): Payer: Self-pay | Admitting: Thoracic Surgery (Cardiothoracic Vascular Surgery)

## 2021-05-16 ENCOUNTER — Other Ambulatory Visit: Payer: Self-pay | Admitting: Family Medicine

## 2021-05-16 ENCOUNTER — Other Ambulatory Visit: Payer: Self-pay

## 2021-05-16 DIAGNOSIS — Z951 Presence of aortocoronary bypass graft: Secondary | ICD-10-CM

## 2021-05-16 MED ORDER — PREDNISONE 20 MG PO TABS: ORAL_TABLET | ORAL | 0 refills | Status: DC

## 2021-05-16 MED ORDER — COLCHICINE 0.6 MG PO TABS: ORAL_TABLET | ORAL | 3 refills | Status: DC

## 2021-05-16 NOTE — Telephone Encounter (Signed)
Please review and advise.

## 2021-05-16 NOTE — Telephone Encounter (Signed)
Patient's wife called to check status and wonders why Dr. Anitra Lauth has not called in colchicine RX yet. She states this is an emergency situation and "would appreciate" it be taken care of today.

## 2021-05-16 NOTE — Telephone Encounter (Signed)
Pt's wife, Vaughan Basta [facility]) advised of med recommendations, voiced understanding.

## 2021-05-16 NOTE — Progress Notes (Signed)
Orders only encounter

## 2021-05-16 NOTE — Telephone Encounter (Signed)
I will eRx colchicine. If this is not helpful in 1-2 days then fill the prednisone rx I will send in now as well.

## 2021-05-16 NOTE — Progress Notes (Signed)
     [address]       [place] [address]             [phone]       Patient: Home Provider: Office Consent for Telemedicine visit obtained.  Today's visit was completed via a real-time telehealth (see specific modality noted below). The patient/authorized person provided oral consent at the time of the visit to engage in a telemedicine encounter with the present provider at [facility]. The patient/authorized person was informed of the potential benefits, limitations, and risks of telemedicine. The patient/authorized person expressed understanding that the laws that protect confidentiality also apply to telemedicine. The patient/authorized person acknowledged understanding that telemedicine does not provide emergency services and that he or she would need to call 911 or proceed to the nearest hospital for help if such a need arose.   Total time spent in the clinical discussion 10 minutes.  Telehealth Modality: Phone visit (audio only)  I had a telephone visit with Mr. Skura.  He is s/p Off-pump CABG.  His pain is improved.  He also complains of gout which is improving.  His pcp did call in the Rx.    F/U in 1 month with CXR

## 2021-05-23 ENCOUNTER — Other Ambulatory Visit: Payer: Self-pay

## 2021-05-23 ENCOUNTER — Ambulatory Visit (HOSPITAL_BASED_OUTPATIENT_CLINIC_OR_DEPARTMENT_OTHER): Payer: Medicare HMO | Admitting: Family

## 2021-05-23 ENCOUNTER — Encounter (HOSPITAL_BASED_OUTPATIENT_CLINIC_OR_DEPARTMENT_OTHER): Payer: Self-pay | Admitting: Family

## 2021-05-23 VITALS — BP 106/64 | HR 71 | Resp 18 | Ht 74.0 in | Wt 201.2 lb

## 2021-05-23 DIAGNOSIS — Z951 Presence of aortocoronary bypass graft: Secondary | ICD-10-CM

## 2021-05-23 DIAGNOSIS — I25118 Atherosclerotic heart disease of native coronary artery with other forms of angina pectoris: Secondary | ICD-10-CM | POA: Diagnosis not present

## 2021-05-23 DIAGNOSIS — E785 Hyperlipidemia, unspecified: Secondary | ICD-10-CM | POA: Diagnosis not present

## 2021-05-23 MED ORDER — ROSUVASTATIN CALCIUM 40 MG PO TABS: 40.0000 mg | ORAL_TABLET | Freq: Every day | ORAL | 3 refills | Status: DC

## 2021-05-23 MED ORDER — CLOPIDOGREL BISULFATE 75 MG PO TABS: 75.0000 mg | ORAL_TABLET | Freq: Every day | ORAL | 3 refills | Status: DC

## 2021-05-23 NOTE — Progress Notes (Signed)
Office Visit    Patient Name: Brandon Haynes Date of Encounter: 05/23/2021  PCP:  Tammi Sou, MD   [place]  Cardiologist:  Fransico Him, MD  Advanced Practice Provider:  No care team member to display Electrophysiologist:  None      Chief Complaint    Brandon Haynes is a 80 y.o. male with a hx of CAD s/p PCI and subsequent CABG, hyperlipidemia, hypertension presents today for follow-up after CABG  Past Medical History    Past Medical History:  Diagnosis Date   Allergic rhinitis    allergy shots via [place]   Anxiety    Bladder cancer ([place]) 05/2019   INVASIVE HIGH GRADE PAPILLARY UROTHELIAL CARCINOMA, invading muscularis propia.  Lesion resected. No mets.  He got neoadjuvant chemo x 4 cycles.  Cystoprostectomy with ileal conduit formation 12/13/19 (Dr. Tresa Moore). 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 difficile colitis 01/09/2020; late May 2021   oral vancomycin   CAD (coronary artery disease)    mid LAD stent 2016; CABG 04/2021   CAD, multiple vessel    a. CP/near-syncope 11/2014 s/p DES to MLAD, occluded RCA after takeoff of RV marginal with collaterals treated medically, otherwise mild nonobstructive disease. EF 55%;  b. 07/2015 Lexiscan CL: EF 56%, no ischemia/infarct.    Cataracts, both eyes    Chronic constipation    worse since ileal conduit surgery was done 2020   Chronic low back pain    DDD (degenerative disc disease), lumbar    GERD (gastroesophageal reflux disease)    PPI bid needed to control sx's   Gout    Hearing loss    History  of basal cell carcinoma    History of adenomatous polyp of colon    Rpt colonoscopy recommended 02/2020->no further if no high risk lesions at that time.   History of prostate cancer 11/2019   grd 2 cancer with neg margins incidental on cystectomy path 11/2019.   Hypercholesteremia    Hypertension    Hx labile/uncontrolled.  Improved s/p visit to HTN  clinic 2019.   Iron deficiency anemia 2017   Colonoscopy, EGD, and capsule endoscopy w/out obvious cause/source found.  Oral iron helping as of 03/2016.   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 No avid enhancement or mass effect. Obs by urol as of 10/2019 and 11/2020.   Past Surgical History:  Procedure Laterality Date   back injection  oct 2011, 07/2010, 12/2012   no surgery   Capsule endoscopy  02/2016   mild duodenitis, o/w NEG   CARDIOVASCULAR STRESS TEST  05/2009; 07/2015   carotid dopplers  11/2014   Bilateral - 1% to 9% ICA stenosis. Vertebral artery flow is   CATARACT EXTRACTION, BILATERAL     Jan, Feb 2022   COLONOSCOPY  08/2008; 02/2016; 02/2017; 05/27/20   2017: 12 polyps (adenomatous).  02/2017 repeat TCS--multiple polyps. 04/2020 +adenomas.   CORONARY ANGIOPLASTY WITH STENT PLACEMENT  12/04/2014   4.0 x 12 mm Synergy stent to the mid LAD   CORONARY ARTERY BYPASS GRAFT N/A 05/06/2021   Procedure: OFF PUMP CORONARY ARTERY BYPASS GRAFTING (CABG) X TWO, USING LEFT INTERNAL MAMMARY ARTERY AND RIGHT  LEG GREATER SAPHENOUS VEIN HARVESTED ENDOSCOPICALLY;  Surgeon: Lajuana Matte, MD;  Location: [facility];  Service: Open Heart Surgery;  Laterality: N/A;   CYSTOSCOPY  05/24/2019   CYSTOSCOPY W/ TRANSURETHRAL RESECTION OF POSTERIOR URETHERAL VALVES  06/12/2019   2-5cm   CYSTOSCOPY WITH INJECTION N/A 12/13/2019   Procedure: CYSTOSCOPY WITH INJECTION;  Surgeon: [name], MD;  Location: [facility];  Service: Urology;  Laterality: N/A;   ESOPHAGOGASTRODUODENOSCOPY  02/2016   Gastritis.  NEG h pylori.     IR IMAGING GUIDED PORT INSERTION  09/12/2019   IR REMOVAL TUN ACCESS W/ PORT W/O FL MOD SED  08/20/2020   LE arterial vascular study  02/2016   Per Dr. Radford Pax, no  evidence of peripheral vascular disease   LEFT HEART CATH AND CORONARY ANGIOGRAPHY N/A 05/01/2021   Procedure: LEFT HEART CATH AND CORONARY ANGIOGRAPHY;  Surgeon: Burnell Blanks, MD;  Location: [facility];  Service: Cardiovascular;  Laterality: N/A;   LEFT HEART CATHETERIZATION WITH CORONARY ANGIOGRAM N/A 12/04/2014   Procedure: LEFT HEART CATHETERIZATION WITH CORONARY ANGIOGRAM;  Surgeon: Jolaine Artist, MD; LAD 99% then aneurysmal, OM1 40%, CFX 50%, RCA 40%, 50% then occluded   LYMPHADENECTOMY Bilateral 12/13/2019   Procedure: LYMPHADENECTOMY;  Surgeon: [name], MD;  Location: [facility];  Service: Urology;  Laterality: Bilateral;   ROBOT ASSISTED LAPAROSCOPIC COMPLETE CYSTECT ILEAL CONDUIT N/A 12/13/2019   Procedure: XI ROBOTIC ASSISTED LAPAROSCOPIC COMPLETE CYSTECT ILEAL CONDUIT AND RADICAL PROSTATECTOMY;  Surgeon: [name], MD;  Location: [facility];  Service: Urology;  Laterality: N/A;  6 HRS   ROTATOR CUFF REPAIR Left 09/2013   left   SHOULDER INJECTION  10/2012   TEE WITHOUT CARDIOVERSION N/A 05/06/2021   Procedure: TRANSESOPHAGEAL ECHOCARDIOGRAM (TEE);  Surgeon: Lajuana Matte, MD;  Location: [place] [facility];  Service: Open Heart Surgery;  Laterality: N/A;   TRANSTHORACIC ECHOCARDIOGRAM  11/2014   EF 50-55%, some wall motion abnormalities noted, grade I diast dysfxn   TRANSTHORACIC ECHOCARDIOGRAM  05/01/2021   pre CABG, EF 55-50%, nl wm, grd I DD, valves fine.   TRANSURETHRAL RESECTION OF BLADDER TUMOR N/A 06/12/2019   INVASIVE HIGH GRADE PAPILLARY UROTHELIAL CARCINOMA, invading muscularis propria.  Procedure: TRANSURETHRAL RESECTION OF BLADDER TUMOR (TURBT);  Surgeon: Lucas Mallow, MD;  Location: [facility];  Service: Urology;  Laterality: N/A;    Allergies  No Known Allergies  History of Present Illness    Brandon Haynes is a 80 y.o. male with a hx of  CAD s/p PCI and subsequent CABG, hyperlipidemia, hypertension  last seen while hospitalized.  CAD  with previously catheterization in 2016 showing CTO of RCA which is well collateralized 99% LAD stenosis requiring PCI.  He was last seen in clinic by Dr. Radford Pax 03/11/2021 and was doing well from a cardiac perspective.  He presented to the ED 04/30/2021 due to midsternal chest tightness and dizziness.  Cardiac catheterization showed heavily calcified LAD, patent mid LAD stent with minimal stenosis with unsuccessful PCI.  He underwent subsequent CABG x2 on 05/06/2021.  He did require midodrine for hypotension.  He had postoperative thrombocytopenia.  He presents today for follow-up with his wife. Tells me he got gout a week after being home which was debilitating. He is on a colchicine course and has already completed a prednisone course.  Tells me after started feeling better yesterday.  He notes only mild shoulder discomfort from his recent surgery.  He has chronic low back pain and was on tramadol even  prior to admission.  Denies shortness of breath at rest or dyspnea on exertion.  Notes occasional lightheadedness.He drinks coffee int he morning and then water. He also drinks diet sodas. Drinks about 2 bottles of water, 1 can of soda.  We discussed the importance of staying well-hydrated to prevent orthostasis and lightheadedness  EKGs/Labs/Other Studies Reviewed:   The following studies were reviewed today:  Echo 05/01/2021 1. Left ventricular ejection fraction, by estimation, is 55 to 60%. The  left ventricle has normal function. The left ventricle has no regional  wall motion abnormalities. Left ventricular diastolic parameters are  consistent with Grade I diastolic  dysfunction (impaired relaxation).   2. Right ventricular systolic function is normal. The right ventricular  size is normal. Tricuspid regurgitation signal is inadequate for assessing  PA pressure.   3. Left atrial size was mildly dilated.   4. The mitral valve is normal in structure. Trivial mitral valve  regurgitation. No evidence  of mitral stenosis.   5. The aortic valve is tricuspid. Aortic valve regurgitation is mild.  Mild aortic valve sclerosis is present, with no evidence of aortic valve  stenosis.   6. Aortic dilatation noted. There is borderline dilatation of the aortic  root, measuring 37 mm.   7. The inferior vena cava is normal in size with greater than 50%  respiratory variability, suggesting right atrial pressure of 3 mmHg.   LHC 05/01/2021   Prox RCA-1 lesion is 50% stenosed.   Prox RCA-2 lesion is 50% stenosed.   Mid RCA lesion is 100% stenosed.   Prox Cx lesion is 30% stenosed.   Mid Cx to Dist Cx lesion is 50% stenosed.   Mid LAD-2 lesion is 20% stenosed.   Mid LAD-1 lesion is 99% stenosed.   Dist LAD lesion is 30% stenosed.   The left ventricular systolic function is normal.   LV end diastolic pressure is normal.   The left ventricular ejection fraction is 55-65% by visual estimate.   There is no mitral valve regurgitation.   Severe heavily calcified mid LAD stenosis. Patent mid LAD stent with minimal restenosis.  Mild to moderate Circumflex stenosis Chronic occlusion mid RCA. The distal branches fill briskly from left to right collaterals Preserved LV systolic function Unsuccessful attempt at PCI of the mid LAD lesion as I was unable to cross the lesion with a wire.    Recommendations: Will consult CT surgery for CABG as PCI was not successful despite a long attempt at crossing the lesion. He was loaded with Plavix 600 mg this am in anticipation of PCI. He will need Plavix washout before surgery.  EKG:  EKG is ordered today.  The ekg ordered today demonstrates NSR 71 bpm with no acute ST/T wave changes.   Recent Labs: 04/30/2021: ALT 16; B Natriuretic Peptide 125.7 05/01/2021: TSH 2.134 05/07/2021: Magnesium 2.2 05/09/2021: BUN 16; Creatinine, Ser 1.03; Hemoglobin 10.3; Platelets 111; Potassium 4.3; Sodium 134  Recent Lipid Panel    Component Value Date/Time   CHOL 130 01/28/2021 1147    CHOL 116 03/20/2020 0823   TRIG 135 01/28/2021 1147   HDL 60 01/28/2021 1147   HDL 62 03/20/2020 0823   CHOLHDL 2.2 01/28/2021 1147   VLDL 24 10/20/2016 1017   LDLCALC 48 01/28/2021 1147   Home Medications   Current Meds  Medication Sig   acetaminophen (TYLENOL) 650 MG CR tablet Take 650 mg by mouth every 8 (eight) hours as needed for pain.   aspirin EC 81 MG tablet  Take 81 mg by mouth daily.   Cholecalciferol (D3 PO) Take 1 capsule by mouth daily.   clopidogrel (PLAVIX) 75 MG tablet Take 1 tablet (75 mg total) by mouth daily.   colchicine 0.6 MG tablet 2 tabs po x 1, then 2 hours later take 1 tab.  Starting the following day take 1 tab po bid until gout flare is resolved.   desonide (DESOWEN) 0.05 % cream Apply 1 application topically daily as needed (dry skin).   EPINEPHrine 0.3 mg/0.3 mL IJ SOAJ injection Inject 0.3 mg into the muscle as needed for anaphylaxis.   gabapentin (NEURONTIN) 300 MG capsule Take 300 mg by mouth 2 (two) times daily.   hydrocortisone cream 1 % Apply 1 application topically 4 (four) times daily as needed for itching.   loratadine (CLARITIN) 10 MG tablet Take 20 mg by mouth daily.   metoprolol tartrate (LOPRESSOR) 25 MG tablet Take 0.5 tablets (12.5 mg total) by mouth 2 (two) times daily.   pantoprazole (PROTONIX) 40 MG tablet TAKE 1 TABLET BY MOUTH EVERY DAY (Patient taking differently: Take 40 mg by mouth daily.)   predniSONE (DELTASONE) 20 MG tablet 2 tabs po qd x 5d   PREVIDENT 5000 BOOSTER PLUS 1.1 % PSTE Take 1 application by mouth as directed.   rosuvastatin (CRESTOR) 40 MG tablet Take 1 tablet (40 mg total) by mouth daily.   traMADol (ULTRAM) 50 MG tablet Take 1 tablet (50 mg total) by mouth every 6 (six) hours as needed for moderate pain.   triamcinolone (NASACORT) 55 MCG/ACT AERO nasal inhaler Place 1 spray into the nose 2 (two) times daily as needed (allergies).     Review of Systems      All other systems reviewed and are otherwise negative  except as noted above.  Physical Exam    VS:  BP 106/64   Pulse 71   Resp 18   Ht '6\' 2"'$  (1.88 m)   Wt 201 lb 3.2 oz (91.3 kg)   SpO2 95%   BMI 25.83 kg/m  , BMI Body mass index is 25.83 kg/m.  Wt Readings from Last 3 Encounters:  05/23/21 201 lb 3.2 oz (91.3 kg)  05/11/21 213 lb 8 oz (96.8 kg)  03/11/21 212 lb (96.2 kg)     GEN: Well nourished, well developed, in no acute distress. HEENT: normal. Neck: Supple, no JVD, carotid bruits, or masses. Cardiac: RRR, no murmurs, rubs, or gallops. No clubbing, cyanosis, edema.  Radials/PT 2+ and equal bilaterally.  Respiratory:  Respirations regular and unlabored, clear to auscultation bilaterally. GI: Soft, nontender, nondistended. MS: No deformity or atrophy. Skin: Warm and dry, no rash.  Midsternal incision and chest tube suture sites clean, dry, intact with well approximated edges and no signs of infection. Neuro:  Strength and sensation are intact. Psych: Normal affect.  Assessment & Plan    CAD s/p CABG- Stable with no anginal symptoms. No indication for ischemic evaluation.  Incisions healing appropriately.  GDMT includes aspirin, Plavix, Crestor, metoprolol.  Refills provided.  Encouraged to participate in cardiac rehab. Heart healthy diet and regular cardiovascular exercise encouraged.    Gout -completed course of prednisone and has a few days left of colchicine course.  Recent gout flare after CABG has limited exercise tolerance.  He is hopeful to start increasing his walking regimen as his gout started to subside yesterday.  He has upcoming follow-up with primary care.  HTN - BP well controlled. Continue current antihypertensive regimen.    HLD -LDL  goal less than 70.  LDL at goal.  Continue rosuvastatin 40 mg daily.  Refill provided.  Lightheadedness -reports occasional lightheadedness most notable with position changes.  Anticipate orthostasis and dehydration contributory.  Discussed the importance of staying  well-hydrated.  Disposition: Follow up in 3 month(s) with Dr. Radford Pax or APP.  Signed, Loel Dubonnet, NP 05/23/2021, 2:14 PM [place]

## 2021-05-23 NOTE — Patient Instructions (Signed)
Medication Instructions:  Continue your current medications.   *If you need a refill on your cardiac medications before your next appointment, please call your pharmacy*   Lab Work: None ordered today.  If you have labs (blood work) drawn today and your tests are completely normal, you will receive your results only by: [place] (if you have MyChart) OR A paper copy in the mail If you have any lab test that is abnormal or we need to change your treatment, we will call you to review the results.   Testing/Procedures: Your EKG today shows normal sinus rhythm.    Follow-Up: At [facility], you and your health needs are our priority.  As part of our continuing mission to provide you with exceptional heart care, we have created designated Provider Care Teams.  These Care Teams include your primary Cardiologist (physician) and Advanced Practice Providers (APPs -  Physician Assistants and Nurse Practitioners) who all work together to provide you with the care you need, when you need it.  We recommend signing up for the patient portal called "MyChart".  Sign up information is provided on this After Visit Summary.  MyChart is used to connect with patients for Virtual Visits (Telemedicine).  Patients are able to view lab/test results, encounter notes, upcoming appointments, etc.  Non-urgent messages can be sent to your provider as well.   To learn more about what you can do with MyChart, go to [email or web address].    Your next appointment:   3 month(s)  The format for your next appointment:   In Person  Provider:   You may see Fransico Him, MD or one of the following Advanced Practice Providers on your designated Care Team:   Melina Copa, PA-C Ermalinda Barrios, PA-C   Other Instructions  For coronary artery disease often called "heart disease" we aim for optimal guideline directed medical therapy. We use the "A, B, C"s to help keep [place] on track!  A = Aspirin '81mg'$  daily B =  Beta blocker which helps to relax the heart. This is your Metoprolol. C = Cholesterol control. You take Rosuvastatin to help control your cholesterol.  D = Don't forget nitroglycerin! This is an emergency tablet to be used if you have chest pain. E = Extras. In your case, this is Plavix to protect your grafts   Heart Healthy Diet Recommendations: A low-salt diet is recommended. Meats should be grilled, baked, or boiled. Avoid fried foods. Focus on lean protein sources like fish or chicken with vegetables and fruits. The American Heart Association is a Microbiologist!   Exercise recommendations: The American Heart Association recommends 150 minutes of moderate intensity exercise weekly. Try 30 minutes of moderate intensity exercise 4-5 times per week. This could include walking, jogging, or swimming.

## 2021-05-25 ENCOUNTER — Encounter (HOSPITAL_BASED_OUTPATIENT_CLINIC_OR_DEPARTMENT_OTHER): Payer: Self-pay | Admitting: Family

## 2021-05-26 ENCOUNTER — Encounter: Payer: Self-pay | Admitting: Family Medicine

## 2021-05-26 ENCOUNTER — Other Ambulatory Visit: Payer: Self-pay

## 2021-05-26 ENCOUNTER — Ambulatory Visit (INDEPENDENT_AMBULATORY_CARE_PROVIDER_SITE_OTHER): Payer: Medicare HMO | Admitting: Family Medicine

## 2021-05-26 VITALS — BP 90/60 | HR 72 | Temp 97.3°F | Resp 16 | Ht 74.0 in | Wt 197.2 lb

## 2021-05-26 DIAGNOSIS — I2510 Atherosclerotic heart disease of native coronary artery without angina pectoris: Secondary | ICD-10-CM

## 2021-05-26 DIAGNOSIS — R739 Hyperglycemia, unspecified: Secondary | ICD-10-CM

## 2021-05-26 DIAGNOSIS — Z951 Presence of aortocoronary bypass graft: Secondary | ICD-10-CM

## 2021-05-26 DIAGNOSIS — I959 Hypotension, unspecified: Secondary | ICD-10-CM | POA: Diagnosis not present

## 2021-05-26 DIAGNOSIS — Z8551 Personal history of malignant neoplasm of bladder: Secondary | ICD-10-CM | POA: Diagnosis not present

## 2021-05-26 DIAGNOSIS — Z936 Other artificial openings of urinary tract status: Secondary | ICD-10-CM | POA: Diagnosis not present

## 2021-05-26 LAB — GLUCOSE, POCT (MANUAL RESULT ENTRY): POC Glucose: 89 mg/dl (ref 70–99)

## 2021-05-26 NOTE — Progress Notes (Signed)
OFFICE VISIT  05/26/2021  CC: "check blood sugar"  HPI:    Patient is a 80 y.o. Caucasian male who presents accompanied by his daughter Magda Paganini for "check blood sugar". Of note, pt underwent CABG 05/06/21 for 3V CAD, hospitalized 8/3-8/10, 2022. About a week after d/c home he called here requesting meds for a gout flare.  I rx'd colchicine + instructions to start prednisone if this didn't help. Review of hosp labs show glucoses normal at times, sometimes in 130s-160 but not clear whether fasting or postprandial.  Hba1c on 05/06/21 was 5.7%. He says he was given insulin in hosp on some days.  CURRENTLY: Doing okay, is regaining energy slowly but recent set back from gout flare--this has now resolved. He restarts allopurinol today/tomorrow. No home glucose monitoring.  Last CBG in hosp was 105 per pt.  Has CT surgery f/u in about 1 mo and they'll decide if he's ready for cardiac rehab. His preCABG echo showed EF 50-55%.  He does not drink fluids well, some days better than others. No presyncope or signif prob with orthostatic dizziness.  ROS as above, plus--> no fevers, no CP, no SOB, no wheezing, no cough, no dizziness, no HAs, no rashes, no melena/hematochezia.  No polyuria or polydipsia.  No myalgias or arthralgias currently.  No focal weakness, paresthesias, or tremors.  No acute vision or hearing abnormalities.  No dysuria or unusual/new urinary urgency or frequency.  No recent changes in lower legs. No n/v/d or abd pain.  No palpitations.    Past Medical History:  Diagnosis Date   Allergic rhinitis    allergy shots via [place]   Anxiety    Bladder cancer ([place]) 05/2019   INVASIVE HIGH GRADE PAPILLARY UROTHELIAL CARCINOMA, invading muscularis propia.  Lesion resected. No mets.  He got neoadjuvant chemo x 4 cycles.  Cystoprostectomy with ileal conduit formation 12/13/19 (Dr. Tresa Moore). 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 difficile  colitis 01/09/2020; late May 2021   oral vancomycin   CAD (coronary artery disease)    mid LAD stent 2016; CABG 04/2021   CAD, multiple vessel    a. CP/near-syncope 11/2014 s/p DES to MLAD, occluded RCA after takeoff of RV marginal with collaterals treated medically, otherwise mild nonobstructive disease. EF 55%;  b. 07/2015 Lexiscan CL: EF 56%, no ischemia/infarct.    Cataracts, both eyes    Chronic constipation    worse since ileal conduit surgery was done 2020   Chronic low back pain    DDD (degenerative disc disease), lumbar    GERD (gastroesophageal reflux disease)    PPI bid needed to control sx's   Gout    Hearing loss    History  of basal cell carcinoma    History of adenomatous polyp of colon    Rpt colonoscopy recommended 02/2020->no further if no high risk lesions at that time.   History of prostate cancer 11/2019   grd 2 cancer with neg margins incidental on cystectomy path 11/2019.   Hypercholesteremia    Hypertension    Hx labile/uncontrolled.  Improved s/p visit to HTN clinic 2019.   Iron deficiency anemia 2017   Colonoscopy, EGD, and capsule endoscopy w/out obvious cause/source found.  Oral iron helping as of 03/2016.   Leg pain 03/06/2016   Nonmelanoma skin cancer    Osteoarthritis of multiple joints 10/18/2014   Peripheral vascular disease (HCC)    Pleural thickening    chronic   Renal neoplasm 10/2019  Small, left kidney: <2 cm, 90% endophytic by CT 04/2019, stable 10/2019. No avid enhancement or mass effect. Obs by urol as of 10/2019 and 11/2020.    Past Surgical History:  Procedure Laterality Date   back injection  oct 2011, 07/2010, 12/2012   no surgery   Capsule endoscopy  02/2016   mild duodenitis, o/w NEG   CARDIOVASCULAR STRESS TEST  05/2009; 07/2015   carotid dopplers  11/2014   Bilateral - 1% to 9% ICA stenosis. Vertebral artery flow is   CATARACT EXTRACTION, BILATERAL     Jan, Feb 2022   COLONOSCOPY  08/2008; 02/2016; 02/2017; 05/27/20   2017: 12 polyps  (adenomatous).  02/2017 repeat TCS--multiple polyps. 04/2020 +adenomas.   CORONARY ANGIOPLASTY WITH STENT PLACEMENT  12/04/2014   4.0 x 12 mm Synergy stent to the mid LAD   CORONARY ARTERY BYPASS GRAFT N/A 05/06/2021   Procedure: OFF PUMP CORONARY ARTERY BYPASS GRAFTING (CABG) X TWO, USING LEFT INTERNAL MAMMARY ARTERY AND RIGHT LEG GREATER SAPHENOUS VEIN HARVESTED ENDOSCOPICALLY;  Surgeon: Lajuana Matte, MD;  Location: [facility];  Service: Open Heart Surgery;  Laterality: N/A;   CYSTOSCOPY  05/24/2019   CYSTOSCOPY W/ TRANSURETHRAL RESECTION OF POSTERIOR URETHERAL VALVES  06/12/2019   2-5cm   CYSTOSCOPY WITH INJECTION N/A 12/13/2019   Procedure: CYSTOSCOPY WITH INJECTION;  Surgeon: [name], MD;  Location: [facility];  Service: Urology;  Laterality: N/A;   ESOPHAGOGASTRODUODENOSCOPY  02/2016   Gastritis.  NEG h pylori.     IR IMAGING GUIDED PORT INSERTION  09/12/2019   IR REMOVAL TUN ACCESS W/ PORT W/O FL MOD SED  08/20/2020   LE arterial vascular study  02/2016   Per Dr. Radford Pax, no evidence of peripheral vascular disease   LEFT HEART CATH AND CORONARY ANGIOGRAPHY N/A 05/01/2021   Procedure: LEFT HEART CATH AND CORONARY ANGIOGRAPHY;  Surgeon: Burnell Blanks, MD;  Location: [facility];  Service: Cardiovascular;  Laterality: N/A;   LEFT HEART CATHETERIZATION WITH CORONARY ANGIOGRAM N/A 12/04/2014   Procedure: LEFT HEART CATHETERIZATION WITH CORONARY ANGIOGRAM;  Surgeon: Jolaine Artist, MD; LAD 99% then aneurysmal, OM1 40%, CFX 50%, RCA 40%, 50% then occluded   LYMPHADENECTOMY Bilateral 12/13/2019   Procedure: LYMPHADENECTOMY;  Surgeon: [name], MD;  Location: [facility];  Service: Urology;  Laterality: Bilateral;   ROBOT ASSISTED LAPAROSCOPIC COMPLETE CYSTECT ILEAL CONDUIT N/A 12/13/2019   Procedure: XI ROBOTIC ASSISTED LAPAROSCOPIC COMPLETE CYSTECT ILEAL CONDUIT AND RADICAL PROSTATECTOMY;  Surgeon: [name], MD;  Location: [facility];  Service: Urology;   Laterality: N/A;  6 HRS   ROTATOR CUFF REPAIR Left 09/2013   left   SHOULDER INJECTION  10/2012   TEE WITHOUT CARDIOVERSION N/A 05/06/2021   Procedure: TRANSESOPHAGEAL ECHOCARDIOGRAM (TEE);  Surgeon: Lajuana Matte, MD;  Location: [facility];  Service: Open Heart Surgery;  Laterality: N/A;   TRANSTHORACIC ECHOCARDIOGRAM  11/2014   EF 50-55%, some wall motion abnormalities noted, grade I diast dysfxn   TRANSTHORACIC ECHOCARDIOGRAM  05/01/2021   pre CABG, EF 55-50%, nl wm, grd I DD, valves fine.   TRANSURETHRAL RESECTION OF BLADDER TUMOR N/A 06/12/2019   INVASIVE HIGH GRADE PAPILLARY UROTHELIAL CARCINOMA, invading muscularis propria.  Procedure: TRANSURETHRAL RESECTION OF BLADDER TUMOR (TURBT);  Surgeon: Lucas Mallow, MD;  Location: [facility];  Service: Urology;  Laterality: N/A;    Outpatient Medications Prior to Visit  Medication Sig Dispense Refill   acetaminophen (TYLENOL) 650 MG CR tablet Take 650 mg by mouth every 8 (eight)  hours as needed for pain.     aspirin EC 81 MG tablet Take 81 mg by mouth daily.     Cholecalciferol (D3 PO) Take 1 capsule by mouth daily.     clopidogrel (PLAVIX) 75 MG tablet Take 1 tablet (75 mg total) by mouth daily. 90 tablet 3   desonide (DESOWEN) 0.05 % cream Apply 1 application topically daily as needed (dry skin).  1   EPINEPHrine 0.3 mg/0.3 mL IJ SOAJ injection Inject 0.3 mg into the muscle as needed for anaphylaxis.     gabapentin (NEURONTIN) 300 MG capsule Take 300 mg by mouth 2 (two) times daily.     hydrocortisone cream 1 % Apply 1 application topically 4 (four) times daily as needed for itching.     loratadine (CLARITIN) 10 MG tablet Take 20 mg by mouth daily.     metoprolol tartrate (LOPRESSOR) 25 MG tablet Take 0.5 tablets (12.5 mg total) by mouth 2 (two) times daily. 180 tablet 3   pantoprazole (PROTONIX) 40 MG tablet TAKE 1 TABLET BY MOUTH EVERY DAY (Patient taking differently: Take 40 mg by mouth daily.) 90 tablet 1   PREVIDENT 5000 BOOSTER  PLUS 1.1 % PSTE Take 1 application by mouth as directed.  1   rosuvastatin (CRESTOR) 40 MG tablet Take 1 tablet (40 mg total) by mouth daily. 90 tablet 3   traMADol (ULTRAM) 50 MG tablet Take 1 tablet (50 mg total) by mouth every 6 (six) hours as needed for moderate pain. 28 tablet 0   triamcinolone (NASACORT) 55 MCG/ACT AERO nasal inhaler Place 1 spray into the nose 2 (two) times daily as needed (allergies).     colchicine 0.6 MG tablet 2 tabs po x 1, then 2 hours later take 1 tab.  Starting the following day take 1 tab po bid until gout flare is resolved. (Patient not taking: Reported on 05/26/2021) 20 tablet 3   predniSONE (DELTASONE) 20 MG tablet 2 tabs po qd x 5d (Patient not taking: Reported on 05/26/2021) 10 tablet 0   No facility-administered medications prior to visit.    No Known Allergies  ROS As per HPI  PE: Vitals with BMI 05/26/2021 05/23/2021 05/11/2021  Height '6\' 2"'$  '6\' 2"'$  -  Weight 197 lbs 3 oz 201 lbs 3 oz -  BMI [id number] -  Systolic 90 [id number] -  Diastolic 60 64 -  Pulse 72 71 75   Gen: Alert, well appearing.  Patient is oriented to person, place, time, and situation. AFFECT: pleasant, lucid thought and speech. CV: RRR, no m/r/g.   LUNGS: CTA bilat, nonlabored resps, good aeration in all lung fields. EXT: no clubbing or cyanosis.  no edema.    LABS:  Lab Results  Component Value Date   TSH 2.134 05/01/2021   Lab Results  Component Value Date   WBC 8.1 05/09/2021   HGB 10.3 (L) 05/09/2021   HCT 33.1 (L) 05/09/2021   MCV 95.1 05/09/2021   PLT 111 (L) 05/09/2021   Lab Results  Component Value Date   IRON 201 (H) 08/18/2016   FERRITIN 74.6 08/18/2016    Lab Results  Component Value Date   CREATININE 1.03 05/09/2021   BUN 16 05/09/2021   NA 134 (L) 05/09/2021   K 4.3 05/09/2021   CL 101 05/09/2021   CO2 25 05/09/2021   Lab Results  Component Value Date   ALT 16 04/30/2021   AST 21 04/30/2021   ALKPHOS 40 04/30/2021   BILITOT 1.0 04/30/2021  Lab Results  Component Value Date   CHOL 130 01/28/2021   Lab Results  Component Value Date   HDL 60 01/28/2021   Lab Results  Component Value Date   LDLCALC 48 01/28/2021   Lab Results  Component Value Date   TRIG 135 01/28/2021   Lab Results  Component Value Date   CHOLHDL 2.2 01/28/2021   Lab Results  Component Value Date   PSA <0.015 11/12/2020   PSA <0.015 06/04/2020   PSA 2.13 03/22/2014   Lab Results  Component Value Date   HGBA1C 5.7 (H) 05/06/2021   Lab Results  Component Value Date   LABURIC 4.3 02/21/2013    IMPRESSION AND PLAN:  1) Transient mild hyperglycemia: while in hosp recently for CABG.  Hba1c 5.7% at that time. Fasting CBG here today is 89. Suspect these sugars were d/t acute stress rxn.  2) Low blood pressure: his bp has been low normal on lopressor 12.'5mg'$  bid prior to his CABG. Somewhat lower since CABG but he admits he does not hydrate very well on some days. We want to keep his BB on board and stopping it likely won't result in much increase in his bp. Encouraged 65 oz fluids daily.  3) CAD, CABG about 3 wks ago. Gradually regaining energy, has CT surg f/u in about 1 mo and likely will start cardiac rehab at that time. Cont BB, statin, ASA, and plavix.  4) Gout: recent flare up resolved.  He was off allopurinol since surgery and plans on restart today. I told him to call if he starts getting acute flare during his restart of allopurinol.  An After Visit Summary was printed and given to the patient.  FOLLOW UP: No follow-ups on file.  Signed:  Crissie Sickles, MD           05/26/2021

## 2021-05-26 NOTE — Patient Instructions (Signed)
Drink 65 oz of fluids every day.

## 2021-05-27 ENCOUNTER — Other Ambulatory Visit: Payer: Self-pay | Admitting: Gastroenterology

## 2021-06-11 ENCOUNTER — Other Ambulatory Visit: Payer: Self-pay

## 2021-06-11 ENCOUNTER — Ambulatory Visit: Payer: Medicare HMO | Admitting: Dermatology

## 2021-06-11 ENCOUNTER — Encounter: Payer: Self-pay | Admitting: Dermatology

## 2021-06-11 DIAGNOSIS — Z1283 Encounter for screening for malignant neoplasm of skin: Secondary | ICD-10-CM

## 2021-06-11 DIAGNOSIS — L570 Actinic keratosis: Secondary | ICD-10-CM

## 2021-06-18 ENCOUNTER — Other Ambulatory Visit: Payer: Self-pay | Admitting: Thoracic Surgery (Cardiothoracic Vascular Surgery)

## 2021-06-18 ENCOUNTER — Telehealth: Payer: Self-pay | Admitting: *Deleted

## 2021-06-18 DIAGNOSIS — Z951 Presence of aortocoronary bypass graft: Secondary | ICD-10-CM

## 2021-06-18 NOTE — Telephone Encounter (Signed)
   Name: Brandon Haynes  DOB: [birth year]  MRN: [id number]   Primary Cardiologist: Fransico Him, MD  Chart reviewed as part of pre-operative protocol coverage. Patient was contacted 06/18/2021 in reference to pre-operative risk assessment for pending surgery as outlined below.  Brandon Haynes was last seen on 05/23/21 by Laurann Montana NP. Patient has history of prior LAD stent in 2016 with occluded RCA with collaterals, recently admitted with CP and negative troponins 04/2021. Cardiac catheterization revealed an occluded RCA which is old and a subtotally occluded mid LAD with a highly calcified lesion which Dr. Angelena Form was unable to cross and referred patient for CABG. The patient had been loaded with Plavix at time of cath in anticipation of PCI but then required Plavix washout prior to CABG. Underwent CABGx 2 on 05/06/21. Was DC on aspirin + Plavix.  I reached out to patient for update on how he is doing. The patient affirms he has been doing well without any new cardiac symptoms. Therefore, based on ACC/AHA guidelines, the patient would be at acceptable risk for the planned procedure without further cardiovascular testing. The patient was advised that if he develops new symptoms prior to surgery to contact our office to arrange for a follow-up visit, and he verbalized understanding.  Regarding request to hold Plavix, Dr. Radford Pax states, "I am fine with holding Plavix for injection."  I will route this recommendation to the requesting party via [facility] fax function and remove from pre-op pool. Please call with questions.  Brandon Pitter, PA-C 06/18/2021, 5:32 PM

## 2021-06-18 NOTE — Telephone Encounter (Signed)
   [facility] Pre-operative Risk Assessment    Patient Name: Brandon Haynes  DOB: [birth year] MRN: [id number]  HEARTCARE STAFF:  - IMPORTANT!!!!!! Under Visit Info/Reason for Call, type in Other and utilize the format Clearance MM/DD/YY or Clearance TBD. Do not use dashes or single digits. - Please review there is not already an duplicate clearance open for this procedure. - If request is for dental extraction, please clarify the # of teeth to be extracted. - If the patient is currently at the dentist's office, call Pre-Op Callback Staff (MA/nurse) to input urgent request.  - If the patient is not currently in the dentist office, please route to the Pre-Op pool.  Request for surgical clearance:  What type of surgery is being performed?  [id number]  When is this surgery scheduled?  TBD  What type of clearance is required (medical clearance vs. Pharmacy clearance to hold med vs. Both)?  BOTH  Are there any medications that need to be held prior to surgery and how long?  PLAVIX X'S 7 DAYS   Practice name and name of physician performing surgery?  [facility] / DR. Davy Pique  What is the office phone number?  [phone]   7.   What is the office fax number?  [phone]  8.   Anesthesia type (None, local, MAC, general) ?     Jeanann Lewandowsky 06/18/2021, 7:05 AM  _________________________________________________________________   (provider comments below)

## 2021-06-18 NOTE — Telephone Encounter (Signed)
   Patient Name: Brandon Haynes  DOB: 1940-12-26 MRN: [id number]  Primary Cardiologist: Fransico Him, MD  Chart reviewed as part of pre-operative protocol coverage. Will route to Dr. Radford Pax for input on holding Plavix as requested for epidural steroid injection for 7 days, then patient will need call.  Dr. Radford Pax - Patient has history of prior LAD stent in 2016 with occluded RCA with collaterals, recently admitted with CP and negative troponins 04/2021. Cardiac catheterization revealed an occluded RCA which is old and a subtotally occluded mid LAD with a highly calcified lesion which Dr. Angelena Form was unable to cross and referred patient for CABG. The patient had been loaded with Plavix at time of cath in anticipation of PCI but then required Plavix washout prior to CABG.Underwent CBAGx 2 on 05/06/21. Was DC on aspirin + Plavix. Saw Caitlin 05/23/21 and was felt to be doing well with plan for 3 month follow-up (scheduled in December). His procedural team is requesting holding Plavix for 7 days. Dr. Radford Pax - Please route response to [facility] PREOP (the pre-op pool). Thank you.    Charlie Pitter, PA-C 06/18/2021, 10:32 AM

## 2021-06-19 ENCOUNTER — Ambulatory Visit (INDEPENDENT_AMBULATORY_CARE_PROVIDER_SITE_OTHER): Payer: Self-pay | Admitting: Physician Assistant

## 2021-06-19 ENCOUNTER — Ambulatory Visit
Admission: RE | Admit: 2021-06-19 | Discharge: 2021-06-19 | Disposition: A | Discharge status: Home / Self Care | Payer: Medicare HMO | Source: Ambulatory Visit | Attending: Thoracic Surgery (Cardiothoracic Vascular Surgery) | Admitting: Thoracic Surgery (Cardiothoracic Vascular Surgery)

## 2021-06-19 ENCOUNTER — Other Ambulatory Visit: Payer: Self-pay

## 2021-06-19 ENCOUNTER — Encounter: Payer: Self-pay | Admitting: Physician Assistant

## 2021-06-19 VITALS — BP 110/70 | HR 78 | Resp 20 | Ht 74.0 in | Wt 205.0 lb

## 2021-06-19 DIAGNOSIS — Z951 Presence of aortocoronary bypass graft: Secondary | ICD-10-CM

## 2021-06-19 NOTE — Progress Notes (Signed)
Brandon Haynes       Brandon Haynes             [phone]       Brandon Haynes is a 80 y.o. male patient is s/p off-pump CABG x 2 on 05/06/2021 with Brandon Haynes. He presents today for his 1 month follow-up appointment.   Today, he has been doing well. He has been walking almost everyday but occasionally has to miss due to his back pain which is chronic. He gets injections down the road that seems to help. He has not had any chest pain or shortness of breath. He has been only taking Tramadol in the afternoons/evenings.    No diagnosis found. Past Medical History:  Diagnosis Date   Allergic rhinitis    allergy shots via Brandon Haynes   Anxiety    Bladder cancer (Brandon Haynes) 05/2019   INVASIVE HIGH GRADE PAPILLARY UROTHELIAL CARCINOMA, invading muscularis propia.  Lesion resected. No mets.  He got neoadjuvant chemo x 4 cycles.  Cystoprostectomy with ileal conduit formation 12/13/19 (Dr. Tresa Haynes). 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 difficile colitis 01/09/2020; late May 2021   oral vancomycin   CAD (coronary artery disease)    mid LAD stent 2016; CABG 04/2021   CAD, multiple vessel    a. CP/near-syncope 11/2014 s/p DES to MLAD, occluded RCA after takeoff of RV marginal with collaterals treated medically, otherwise mild nonobstructive disease. EF 55%;  b. 07/2015 Lexiscan CL: EF 56%, no ischemia/infarct.    Cataracts, both eyes    Chronic constipation    worse since ileal conduit surgery was done 2020   Chronic low back pain    DDD (degenerative disc disease), lumbar    GERD (gastroesophageal reflux disease)    PPI bid needed to control sx's   Gout    Hearing loss    History  of basal cell carcinoma    History of adenomatous polyp of colon    Rpt colonoscopy recommended 02/2020->no further if no high risk lesions at that time.   History of prostate cancer 11/2019   grd 2 cancer with neg margins incidental on cystectomy  path 11/2019.   Hypercholesteremia    Hypertension    Hx labile/uncontrolled.  Improved s/p visit to HTN clinic 2019.   Iron deficiency anemia 2017   Colonoscopy, EGD, and capsule endoscopy w/out obvious cause/source found.  Oral iron helping as of 03/2016.   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 No avid enhancement or mass effect. Obs by urol as of 10/2019 and 11/2020.   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 Scheduled Meds: Current Outpatient Medications on File Prior to Visit  Medication Sig Dispense Refill   acetaminophen (TYLENOL) 650 MG CR tablet Take 650 mg by mouth every 8 (eight) hours as needed for pain.     aspirin EC 81 MG tablet Take 81 mg by mouth daily.     Cholecalciferol (D3  PO) Take 1 capsule by mouth daily.     clopidogrel (PLAVIX) 75 MG tablet Take 1 tablet (75 mg total) by mouth daily. 90 tablet 3   colchicine 0.6 MG tablet 2 tabs po x 1, then 2 hours later take 1 tab.  Starting the following day take 1 tab po bid until gout flare is resolved. (Patient taking differently: 2 tabs po x 1, then 2 hours later take 1 tab.  Starting the following day take 1 tab po bid until gout flare is resolved.) 20 tablet 3   desonide (DESOWEN) 0.05 % cream Apply 1 application topically daily as needed (dry skin).  1   EPINEPHrine 0.3 mg/0.3 mL IJ SOAJ injection Inject 0.3 mg into the muscle as needed for anaphylaxis.     gabapentin (NEURONTIN) 300 MG capsule Take 300 mg by mouth 2 (two) times daily.     hydrocortisone cream 1 % Apply 1 application  topically 4 (four) times daily as needed for itching.     loratadine (CLARITIN) 10 MG tablet Take 20 mg by mouth daily.     metoprolol tartrate (LOPRESSOR) 25 MG tablet Take 0.5 tablets (12.5 mg total) by mouth 2 (two) times daily. 180 tablet 3   pantoprazole (PROTONIX) 40 MG tablet Take 1 tablet (40 mg total) by mouth daily. Please schedule an office visit for further refills. Thank you. 90 tablet 0   PREVIDENT 5000 BOOSTER PLUS 1.1 % PSTE Take 1 application by mouth as directed.  1   rosuvastatin (CRESTOR) 40 MG tablet Take 1 tablet (40 mg total) by mouth daily. 90 tablet 3   traMADol (ULTRAM) 50 MG tablet Take 1 tablet (50 mg total) by mouth every 6 (six) hours as needed for moderate pain. 28 tablet 0   triamcinolone (NASACORT) 55 MCG/ACT AERO nasal inhaler Place 1 spray into the nose 2 (two) times daily as needed (allergies).     No current facility-administered medications on file prior to visit.     CLINICAL DATA:  Status post CABG   EXAM: CHEST - 2 VIEW   COMPARISON:  05/08/2021   FINDINGS: The heart size is normal. Status post median sternotomy and CABG. Both lungs are clear. The visualized skeletal structures are unremarkable.   IMPRESSION: No acute abnormality of the lungs.     Electronically Signed   By: Brandon Haynes M.D.   On: 06/19/2021 10:58   Objective: Vital signs (most recent): There were no vitals taken for this visit. Today's Vitals   06/19/21 1039  BP: 110/70  Pulse: 78  Resp: 20  SpO2: 95%  Weight: 205 lb (93 kg)  Height: 6\' 2"  (1.88 m)   Body mass index is 26.32 kg/m.   Cor: NSR, no murmur Pulm: CTA bilaterally and in all fields Abd: no tenderness Wound: clean and dry, healing well, stable to palpation Ext: 1-2+ pitting edema in bilateral lower ext  Assessment & Plan  Doing well one month post CABG. We reviewed his CXR which was stable. We discussed increasing physical activity but holding off on his riding mower until the 6 week point  after surgery.  He is cleared for driving and informed to take his Tramadol only in the afternoon/evenings once he is done driving for the day. He is encouraged to take short trips and avoid night driving for a while We will provide a referral for cardiac rehab at [facility] since this is close to his house. He is encouraged to participate.  Lower extremity edema-he  is encouraged to elevate his feet and continue his daily ambulation adding time on as able. He is somewhat limited with his back pain. If he notices his weight increase 3-5 lbs over 24 hours or his edema becomes worse he is to call our office.  Maintaining a healthy diet, low sodium. He wants to loose about 10 more lbs.   Plan: I do not think he needs any routine follow-up. I would encourage him to call if he has further questions.   Elgie Collard 06/19/2021

## 2021-06-20 ENCOUNTER — Ambulatory Visit: Payer: Medicare HMO | Admitting: Thoracic Surgery (Cardiothoracic Vascular Surgery)

## 2021-06-23 ENCOUNTER — Encounter: Payer: Self-pay | Admitting: Dermatology

## 2021-06-23 NOTE — Progress Notes (Signed)
   Follow-Up Visit   Subjective  Brandon Haynes is a 80 y.o. male who presents for the following: Annual Exam (No new concerns).  General skin examination, crust on back Location:  Duration:  Quality:  Associated Signs/Symptoms: Modifying Factors:  Severity:  Timing: Context:   Objective  Well appearing patient in no apparent distress; mood and affect are within normal limits. Torso - Posterior (Back) Waist up skin examination no atypical pigmented lesions or nonmelanoma skin cancer  Mid Back Pink 6 mm crust, AK versus I SK       All skin waist up examined.   Assessment & Plan    Encounter for screening for malignant neoplasm of skin Torso - Posterior (Back)  Annual skin examination  AK (actinic keratosis) Mid Back  Destruction of lesion - Mid Back Complexity: simple   Destruction method: cryotherapy   Informed consent: discussed and consent obtained   Timeout:  patient name, date of birth, surgical site, and procedure verified Lesion destroyed using liquid nitrogen: Yes   Cryotherapy cycles:  3 Outcome: patient tolerated procedure well with no complications        I, Lavonna Monarch, MD, have reviewed all documentation for this visit.  The documentation on 06/23/21 for the exam, diagnosis, procedures, and orders are all accurate and complete.

## 2021-06-24 ENCOUNTER — Other Ambulatory Visit: Payer: Self-pay

## 2021-07-01 DIAGNOSIS — Z8551 Personal history of malignant neoplasm of bladder: Secondary | ICD-10-CM | POA: Diagnosis not present

## 2021-07-01 DIAGNOSIS — Z936 Other artificial openings of urinary tract status: Secondary | ICD-10-CM | POA: Diagnosis not present

## 2021-07-07 DIAGNOSIS — H43812 Vitreous degeneration, left eye: Secondary | ICD-10-CM | POA: Diagnosis not present

## 2021-07-07 DIAGNOSIS — Z961 Presence of intraocular lens: Secondary | ICD-10-CM | POA: Diagnosis not present

## 2021-07-07 DIAGNOSIS — H35371 Puckering of macula, right eye: Secondary | ICD-10-CM | POA: Diagnosis not present

## 2021-07-10 DIAGNOSIS — Z8551 Personal history of malignant neoplasm of bladder: Secondary | ICD-10-CM | POA: Diagnosis not present

## 2021-07-10 DIAGNOSIS — Z936 Other artificial openings of urinary tract status: Secondary | ICD-10-CM | POA: Diagnosis not present

## 2021-07-22 ENCOUNTER — Telehealth: Payer: Self-pay

## 2021-07-22 DIAGNOSIS — Z20822 Contact with and (suspected) exposure to covid-19: Secondary | ICD-10-CM | POA: Diagnosis not present

## 2021-07-22 DIAGNOSIS — R52 Pain, unspecified: Secondary | ICD-10-CM | POA: Diagnosis not present

## 2021-07-22 DIAGNOSIS — U071 COVID-19: Secondary | ICD-10-CM | POA: Diagnosis not present

## 2021-07-22 DIAGNOSIS — Z03818 Encounter for observation for suspected exposure to other biological agents ruled out: Secondary | ICD-10-CM | POA: Diagnosis not present

## 2021-07-22 NOTE — Telephone Encounter (Signed)
Brandon Haynes at [facility] in [place] [phone]  Pt tested positive for [facility] at [facility] and was interested in getting Paxlovid. Hinton Dyer called due to medication interaction concern. She wants to make sure pt can take Rx due to kidney functions. Number give is her personal number.  [phone] is the number that Hinton Dyer called from, states it can be hard to reach her when calling minute clinic

## 2021-07-23 MED ORDER — NIRMATRELVIR/RITONAVIR (PAXLOVID)TABLET: 3.0000 | ORAL_TABLET | Freq: Two times a day (BID) | ORAL | 0 refills | Status: AC

## 2021-07-23 NOTE — Telephone Encounter (Signed)
Spoke with pt regarding recommendations,voiced understanding. Rx has been sent in. Pt's appt for 10/28 has been r/s to 11/14

## 2021-07-23 NOTE — Telephone Encounter (Signed)
OK for pt to take paxlovid.  His GFR is 80. If the minute clinic provider has not already rx'd paxlovid then pls do rx for him and designate his GFR in the "notes to pharmacy" section.

## 2021-07-23 NOTE — Addendum Note (Signed)
Addended by: Deveron Furlong D on: 07/23/2021 10:39 AM   Modules accepted: Orders

## 2021-07-25 ENCOUNTER — Ambulatory Visit: Payer: Medicare HMO | Admitting: Family Medicine

## 2021-07-29 DIAGNOSIS — C672 Malignant neoplasm of lateral wall of bladder: Secondary | ICD-10-CM | POA: Diagnosis not present

## 2021-07-31 ENCOUNTER — Other Ambulatory Visit: Payer: Self-pay

## 2021-07-31 ENCOUNTER — Inpatient Hospital Stay: Payer: Medicare HMO | Attending: Oncology | Admitting: Oncology

## 2021-07-31 VITALS — BP 130/80 | HR 67 | Temp 97.5°F | Resp 18 | Wt 204.1 lb

## 2021-07-31 DIAGNOSIS — C679 Malignant neoplasm of bladder, unspecified: Secondary | ICD-10-CM | POA: Diagnosis not present

## 2021-07-31 DIAGNOSIS — N2889 Other specified disorders of kidney and ureter: Secondary | ICD-10-CM | POA: Diagnosis not present

## 2021-07-31 DIAGNOSIS — Z8551 Personal history of malignant neoplasm of bladder: Secondary | ICD-10-CM | POA: Diagnosis not present

## 2021-07-31 DIAGNOSIS — D649 Anemia, unspecified: Secondary | ICD-10-CM | POA: Diagnosis not present

## 2021-07-31 NOTE — Progress Notes (Signed)
Hematology and Oncology Follow Up Visit  Brandon Haynes [id number] [birth year] 80 y.o. 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  He had no residual disease after surgical resection in 2021.   Prior Therapy:  He is status post TURBT on 06/12/2019 which showed high grade invasive urothelial carcinoma invading into the muscularis propria.  Neoadjuvant chemotherapy utilizing cisplatin and gemcitabine.  He completed 4 cycles of chemotherapy on October 20, 2019.  He is status post radical cystectomy and lymphadenectomy completed on December 13, 2019.  Final pathology showed The carcinoma in situ with 0 out of 12 lymph node involvement.    Current therapy: Active surveillance.    Interim History: Brandon Haynes returns today for a follow-up visit.  Since the last visit, he was hospitalized in August 2022 for coronary artery bypass graft completed on May 06, 2021.  Since that time, he reports feeling well without any major complaints.  He denies any shortness of breath or difficulty breathing.  He has regained most activities of daily living.     Medications: Reviewed without changes. Current Outpatient Medications  Medication Sig Dispense Refill   acetaminophen (TYLENOL) 650 MG CR tablet Take 650 mg by mouth every 8 (eight) hours as needed for pain.     aspirin EC 81 MG tablet Take 81 mg by mouth daily.     Cholecalciferol (D3 PO) Take 1 capsule by mouth daily.     clopidogrel (PLAVIX) 75 MG tablet Take 1 tablet (75 mg total) by mouth daily. 90 tablet 3   colchicine 0.6 MG tablet 2 tabs po x 1, then 2 hours later take 1 tab.  Starting the following day take 1 tab po bid until gout flare is resolved. (Patient taking differently: 2 tabs po x 1, then 2 hours later take 1 tab.  Starting the following day take 1 tab po bid until gout flare is resolved.) 20  tablet 3   desonide (DESOWEN) 0.05 % cream Apply 1 application topically daily as needed (dry skin).  1   EPINEPHrine 0.3 mg/0.3 mL IJ SOAJ injection Inject 0.3 mg into the muscle as needed for anaphylaxis.     gabapentin (NEURONTIN) 300 MG capsule Take 300 mg by mouth 2 (two) times daily.     hydrocortisone cream 1 % Apply 1 application topically 4 (four) times daily as needed for itching.     loratadine (CLARITIN) 10 MG tablet Take 20 mg by mouth daily.     metoprolol tartrate (LOPRESSOR) 25 MG tablet Take 0.5 tablets (12.5 mg total) by mouth 2 (two) times daily. 180 tablet 3   pantoprazole (PROTONIX) 40 MG tablet Take 1 tablet (40 mg total) by mouth daily. Please schedule an office visit for further refills. Thank you. 90 tablet 0   PREVIDENT 5000 BOOSTER PLUS 1.1 % PSTE Take 1 application by mouth as directed.  1   rosuvastatin (CRESTOR) 40 MG tablet Take 1 tablet (40 mg total) by mouth daily. 90 tablet 3   traMADol (ULTRAM) 50 MG tablet Take 1 tablet (50 mg total) by mouth every 6 (six) hours as needed for moderate pain. 28 tablet 0   triamcinolone (NASACORT) 55 MCG/ACT AERO nasal inhaler Place 1 spray into the nose 2 (two) times daily as needed (allergies).     No current facility-administered medications for this visit.     Allergies: No Known Allergies  Physical Exam:    Blood pressure 130/80, pulse 67, temperature (!) 97.5 F (36.4 C), resp. rate 18, weight 204 lb 1.6 oz (92.6 kg), SpO2 100 %.    ECOG: 0   General appearance: Alert, awake without any distress. Head: Atraumatic without abnormalities Oropharynx: Without any thrush or ulcers. Eyes: No scleral icterus. Lymph nodes: No lymphadenopathy noted in the cervical, supraclavicular, or axillary nodes Heart:regular rate and rhythm, without any murmurs or gallops.   Lung: Clear to auscultation without any rhonchi, wheezes or dullness to percussion. Abdomin: Soft, nontender without any shifting dullness or  ascites. Musculoskeletal: No clubbing or cyanosis. Neurological: No motor or sensory deficits. Skin: No rashes or lesions.          Lab Results: Lab Results  Component Value Date   WBC 8.1 05/09/2021   HGB 10.3 (L) 05/09/2021   HCT 33.1 (L) 05/09/2021   MCV 95.1 05/09/2021   PLT 111 (L) 05/09/2021     Chemistry      Component Value Date/Time   NA 134 (L) 05/09/2021 0046   NA 143 03/13/2019 1141   K 4.3 05/09/2021 0046   CL 101 05/09/2021 0046   CO2 25 05/09/2021 0046   BUN 16 05/09/2021 0046   BUN 11 03/13/2019 1141   CREATININE 1.03 05/09/2021 0046   CREATININE 1.20 (H) 01/28/2021 1147      Component Value Date/Time   CALCIUM 8.6 (L) 05/09/2021 0046   ALKPHOS 40 04/30/2021 1236   AST 21 04/30/2021 1236   AST 17 07/31/2020 1005   ALT 16 04/30/2021 1236   ALT 14 07/31/2020 1005   BILITOT 1.0 04/30/2021 1236   BILITOT 0.5 07/31/2020 6653      80 year old man with:   1.  Bladder cancer diagnosed in 2020.  He presented with T2N0 high-grade urothelial carcinoma and achieved complete response to chemotherapy and surgical resection.   He is currently on active surveillance at this time without any evidence of relapse.  Risks and benefits of continuing this approach and risk of relapse was assessed.  He is currently at low risk given his complete response to therapy and salvage therapy will be only indicated upon relapse.  Additional immunotherapy is not recommended at this time.  He will have repeat imaging studies in 6 months under the care of Dr. Bess Harvest.      3.  Anemia: Resolved and the hemoglobin did drop down.  Operatively in August 2022 which is expected.  Hemoglobin was 10.3.  4.  Left kidney mass: He continues to follow with Dr. Tresa Moore regarding this issue.  6.   Follow-up: He will return in 6 months for a follow-up visit.   30 minutes were dedicated to this encounter.  The time was spent on reviewing disease status, imaging studies and future treatment  choices upon relapse.     Zola Button, MD 11/3/202210:16 AM

## 2021-08-02 ENCOUNTER — Other Ambulatory Visit: Payer: Self-pay | Admitting: Family Medicine

## 2021-08-04 ENCOUNTER — Other Ambulatory Visit: Payer: Medicare HMO

## 2021-08-04 ENCOUNTER — Ambulatory Visit: Payer: Medicare HMO | Admitting: Oncology

## 2021-08-05 ENCOUNTER — Encounter: Payer: Self-pay | Admitting: Family Medicine

## 2021-08-05 NOTE — Telephone Encounter (Addendum)
Confirmed pt is still taking this medication and will not have enough until upcoming appt on 11/14. He takes 1 tramadol and 1 tylenol and if sciatic nerve is acting up, he will take 1 tylenol and 2 tramadol. Last refill was 8/14 qty 28 by [facility].  Med pending. Please review and advise

## 2021-08-11 ENCOUNTER — Encounter: Payer: Self-pay | Admitting: Family Medicine

## 2021-08-11 ENCOUNTER — Other Ambulatory Visit: Payer: Self-pay

## 2021-08-11 ENCOUNTER — Ambulatory Visit (INDEPENDENT_AMBULATORY_CARE_PROVIDER_SITE_OTHER): Payer: Medicare HMO | Admitting: Family Medicine

## 2021-08-11 VITALS — BP 95/61 | HR 62 | Temp 97.8°F | Ht 74.0 in | Wt 207.4 lb

## 2021-08-11 DIAGNOSIS — I2510 Atherosclerotic heart disease of native coronary artery without angina pectoris: Secondary | ICD-10-CM

## 2021-08-11 DIAGNOSIS — Z79899 Other long term (current) drug therapy: Secondary | ICD-10-CM | POA: Diagnosis not present

## 2021-08-11 DIAGNOSIS — E7800 Pure hypercholesterolemia, unspecified: Secondary | ICD-10-CM | POA: Diagnosis not present

## 2021-08-11 DIAGNOSIS — Z23 Encounter for immunization: Secondary | ICD-10-CM

## 2021-08-11 LAB — LIPID PANEL
Cholesterol: 143 mg/dL (ref 0–200)
HDL: 62.9 mg/dL (ref >39.00)
LDL Cholesterol: 41 mg/dL (ref 0–99)
NonHDL: 79.92
Total CHOL/HDL Ratio: 2
Triglycerides: 193 mg/dL — ABNORMAL HIGH (ref 0.0–149.0)
VLDL: 38.6 mg/dL (ref 0.0–40.0)

## 2021-08-11 LAB — COMPREHENSIVE METABOLIC PANEL
ALT: 9 U/L (ref 0–53)
AST: 15 U/L (ref 0–37)
Albumin: 4.4 g/dL (ref 3.5–5.2)
Alkaline Phosphatase: 53 U/L (ref 39–117)
BUN: 17 mg/dL (ref 6–23)
CO2: 29 mEq/L (ref 19–32)
Calcium: 9.5 mg/dL (ref 8.4–10.5)
Chloride: 105 mEq/L (ref 96–112)
Creatinine, Ser: 1.07 mg/dL (ref 0.40–1.50)
GFR: 66.43 mL/min (ref >60.00)
Glucose, Bld: 87 mg/dL (ref 70–99)
Potassium: 4.2 mEq/L (ref 3.5–5.1)
Sodium: 142 mEq/L (ref 135–145)
Total Bilirubin: 0.5 mg/dL (ref 0.2–1.2)
Total Protein: 6.9 g/dL (ref 6.0–8.3)

## 2021-08-11 MED ORDER — TETANUS-DIPHTH-ACELL PERTUSSIS 5-2-15.5 LF-MCG/0.5 IM SUSP: 0.5000 mL | Freq: Once | INTRAMUSCULAR | 0 refills | Status: AC

## 2021-08-11 NOTE — Progress Notes (Signed)
OFFICE VISIT  08/11/2021  CC:  Chief Complaint  Patient presents with   Follow-up    RCI    HPI:    Patient is a 80 y.o. male who presents for f/u blood pressures,HLD, and CAD. A/P as of last visit on 05/26/21: "1) Transient mild hyperglycemia: while in hosp recently for CABG.  Hba1c 5.7% at that time. Fasting CBG here today is 89. Suspect these sugars were d/t acute stress rxn.   2) Low blood pressure: his bp has been low normal on lopressor 12.5mg  bid prior to his CABG. Somewhat lower since CABG but he admits he does not hydrate very well on some days. We want to keep his BB on board and stopping it likely won't result in much increase in his bp. Encouraged 65 oz fluids daily.  3) CAD, CABG about 3 wks ago. Gradually regaining energy, has CT surg f/u in about 1 mo and likely will start cardiac rehab at that time. Cont BB, statin, ASA, and plavix.   4) Gout: recent flare up resolved.  He was off allopurinol since surgery and plans on restart today. I told him to call if he starts getting acute flare during his restart of allopurinol."  INTERIM HX: [name] is feeling well and feels like he is almost returned to his pre-CABG level of energy.  He works in the yard and walks around [employer] and shopping centers without problem. He did have a case of COVID last month that he describes as a cold for a few days and then all symptoms resolved.  Says home blood pressures typically up and 409-735 systolic.  He does have recurrent sciatica pain on the right side that he uses 2 tramadol for every night. He does have lumbar degenerative disc disease and has received epidural steroid injections in the past and apparently has plan to set this up again in the future.  Past Medical History:  Diagnosis Date   Allergic rhinitis    allergy shots via [place]   Anxiety    Bladder cancer ([place]) 05/2019   [id number].  Lesion resected. No mets.  He got neoadjuvant chemo x 4 cycles.   Cystoprostectomy with ileal conduit formation 12/13/19 (Dr. Tresa Moore). 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 difficile colitis 01/09/2020; late May 2021   oral vancomycin   CAD (coronary artery disease)    mid LAD stent 2016; CABG 04/2021   CAD, multiple vessel    a. CP/near-syncope 11/2014 s/p DES to MLAD, occluded RCA after takeoff of RV marginal with collaterals treated medically, otherwise mild nonobstructive disease. EF 55%;  b. 07/2015 Lexiscan CL: EF 56%, no ischemia/infarct.    Cataracts, both eyes    Chronic constipation    worse since ileal conduit surgery was done 2020   Chronic low back pain    DDD (degenerative disc disease), lumbar    GERD (gastroesophageal reflux disease)    PPI bid needed to control sx's   Gout    Hearing loss    History  of basal cell carcinoma    History of adenomatous polyp of colon    History of prostate cancer 11/2019   grd 2 cancer with neg margins incidental on cystectomy path 11/2019.   Hypercholesteremia    Hypertension    Hx labile/uncontrolled.  Improved s/p visit to HTN clinic 2019.   Iron deficiency anemia 2017   Colonoscopy, EGD, and capsule endoscopy w/out obvious cause/source found.  Oral iron  helping as of 03/2016.   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 No avid enhancement or mass effect. Obs by urol as of 10/2019 and 11/2020.   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  Vertebral artery flow is   CATARACT EXTRACTION, BILATERAL     Jan, Feb 2022   COLONOSCOPY  08/2008; 02/2016; 02/2017; 05/27/20   2017: 12 polyps (adenomatous).  02/2017 repeat TCS--multiple polyps. 04/2020 +adenomas.   CORONARY ANGIOPLASTY WITH STENT PLACEMENT  12/04/2014   4.0 x 12 mm Synergy stent to the mid LAD   CORONARY ARTERY BYPASS GRAFT N/A 05/06/2021   Procedure: OFF PUMP CORONARY ARTERY BYPASS GRAFTING (CABG) X TWO, USING LEFT INTERNAL MAMMARY ARTERY AND RIGHT LEG GREATER SAPHENOUS VEIN HARVESTED ENDOSCOPICALLY;  Surgeon: Lajuana Matte, MD;  Location: [place];  Service: Open Heart Surgery;  Laterality: N/A;   CYSTOSCOPY  05/24/2019   CYSTOSCOPY W/ TRANSURETHRAL RESECTION OF POSTERIOR URETHERAL VALVES  06/12/2019   2-5cm   CYSTOSCOPY WITH INJECTION N/A 12/13/2019   Procedure: CYSTOSCOPY WITH INJECTION;  Surgeon: [name], MD;  Location: [facility];  Service: Urology;  Laterality: N/A;   ESOPHAGOGASTRODUODENOSCOPY  02/2016   Gastritis.  NEG h pylori.     IR IMAGING GUIDED PORT INSERTION  09/12/2019   IR REMOVAL TUN ACCESS W/ PORT W/O FL MOD SED  08/20/2020   LE arterial vascular study  02/2016   Per Dr. Radford Pax, no evidence of peripheral vascular disease   LEFT HEART CATH AND CORONARY ANGIOGRAPHY N/A 05/01/2021   Procedure: LEFT HEART CATH AND CORONARY ANGIOGRAPHY;  Surgeon: Burnell Blanks, MD;  Location: [place] [facility];  Service: Cardiovascular;  Laterality: N/A;   LEFT HEART CATHETERIZATION WITH CORONARY ANGIOGRAM N/A 12/04/2014   Procedure: LEFT HEART CATHETERIZATION WITH CORONARY ANGIOGRAM;  Surgeon: Jolaine Artist, MD; LAD 99% then aneurysmal, OM1 40%, CFX 50%, RCA 40%, 50% then occluded   LYMPHADENECTOMY Bilateral 12/13/2019   Procedure: LYMPHADENECTOMY;  Surgeon: [name], MD;  Location: [facility];  Service: Urology;  Laterality: Bilateral;   ROBOT ASSISTED LAPAROSCOPIC COMPLETE CYSTECT ILEAL CONDUIT N/A 12/13/2019   Procedure: XI ROBOTIC ASSISTED  LAPAROSCOPIC COMPLETE CYSTECT ILEAL CONDUIT AND RADICAL PROSTATECTOMY;  Surgeon: [name], MD;  Location: [facility];  Service: Urology;  Laterality: N/A;  6 HRS   ROTATOR CUFF REPAIR Left 09/2013   left   SHOULDER INJECTION  10/2012   TEE WITHOUT CARDIOVERSION N/A 05/06/2021   Procedure: TRANSESOPHAGEAL ECHOCARDIOGRAM (TEE);  Surgeon: Lajuana Matte, MD;  Location: [place];  Service: Open Heart Surgery;  Laterality: N/A;   TRANSTHORACIC ECHOCARDIOGRAM  11/2014   EF 50-55%, some wall motion abnormalities noted, grade I diast dysfxn   TRANSTHORACIC ECHOCARDIOGRAM  05/01/2021   pre CABG, EF 55-50%, nl wm, grd I DD, valves fine.   TRANSURETHRAL RESECTION OF BLADDER TUMOR N/A 06/12/2019   INVASIVE HIGH GRADE PAPILLARY UROTHELIAL CARCINOMA, invading muscularis propria.  Procedure: TRANSURETHRAL RESECTION OF BLADDER TUMOR (TURBT);  Surgeon: Lucas Mallow, MD;  Location: [facility];  Service: Urology;  Laterality: N/A;    Outpatient Medications Prior to Visit  Medication Sig Dispense Refill   acetaminophen (TYLENOL) 650 MG CR tablet Take 650 mg by mouth every 8 (eight) hours as needed for pain.     aspirin EC 81 MG tablet Take 81 mg by mouth daily.     Cholecalciferol (D3 PO) Take 1 capsule by mouth daily.     clopidogrel (PLAVIX) 75 MG tablet Take 1 tablet (75 mg total) by mouth daily. 90 tablet 3   desonide (DESOWEN) 0.05 % cream Apply 1 application topically daily as needed (dry skin).  1   EPINEPHrine 0.3 mg/0.3 mL IJ SOAJ injection Inject 0.3 mg into the muscle as needed for anaphylaxis.     gabapentin (NEURONTIN) 300 MG capsule Take 300 mg by mouth 2 (two) times daily.     hydrocortisone cream 1 % Apply 1 application topically 4 (four) times daily as needed for itching.     loratadine (CLARITIN) 10 MG tablet Take 20 mg by mouth daily.     metoprolol tartrate (LOPRESSOR) 25 MG tablet Take 0.5 tablets (12.5 mg total) by mouth 2 (two) times daily. 180 tablet 3   pantoprazole (PROTONIX)  40 MG tablet Take 1 tablet (40 mg total) by mouth daily. Please schedule an office visit for further refills. Thank you. 90 tablet 0   PREVIDENT 5000 BOOSTER PLUS 1.1 % PSTE Take 1 application by mouth as directed.  1   rosuvastatin (CRESTOR) 40 MG tablet Take 1 tablet (40 mg total) by mouth daily. 90 tablet 3   traMADol (ULTRAM) 50 MG tablet TAKE 1 TABLET BY MOUTH THREE TIMES A DAY AS NEEDED FOR PAIN 90 tablet 0   triamcinolone (NASACORT) 55 MCG/ACT AERO nasal inhaler Place 1 spray into the nose 2 (two) times daily as needed (allergies).     colchicine 0.6 MG tablet 2 tabs po x 1, then 2 hours later take 1 tab.  Starting the following day take 1 tab po bid until gout flare is resolved. (Patient not taking: Reported on 08/11/2021) 20 tablet 3   No facility-administered medications prior to visit.    No Known Allergies  ROS As per HPI  PE: Vitals with BMI 08/11/2021 07/31/2021 06/19/2021  Height 6\' 2"  - 6\' 2"   Weight 207 lbs 6 oz 204 lbs 2 oz 205 lbs  BMI 26.94 - 85.46  Systolic 95 270 350  Diastolic 61 80 70  Pulse 62 67 78   Gen: Alert, well appearing.  Patient is oriented to person, place, time, and situation. AFFECT: pleasant, lucid thought and speech. CV: RRR, no m/r/g.   LUNGS: CTA bilat, nonlabored resps, good aeration in all lung fields. EXT: no clubbing or cyanosis.  no edema.    LABS:    Chemistry      Component Value Date/Time   NA 134 (L) 05/09/2021 0046   NA 143 03/13/2019 1141   K 4.3 05/09/2021 0046   CL 101 05/09/2021 0046   CO2 25 05/09/2021 0046   BUN 16 05/09/2021 0046   BUN 11 03/13/2019 1141   CREATININE 1.03 05/09/2021 0046   CREATININE 1.20 (H) 01/28/2021 1147      Component Value Date/Time   CALCIUM 8.6 (L) 05/09/2021 0046   ALKPHOS 40 04/30/2021 1236   AST 21 04/30/2021 1236   AST 17 07/31/2020 1005   ALT 16  04/30/2021 1236   ALT 14 07/31/2020 1005   BILITOT 1.0 04/30/2021 1236   BILITOT 0.5 07/31/2020 1005     Lab Results  Component  Value Date   WBC 8.1 05/09/2021   HGB 10.3 (L) 05/09/2021   HCT 33.1 (L) 05/09/2021   MCV 95.1 05/09/2021   PLT 111 (L) 05/09/2021   Lab Results  Component Value Date   IRON 201 (H) 08/18/2016   FERRITIN 74.6 08/18/2016   Lab Results  Component Value Date   CHOL 130 01/28/2021   HDL 60 01/28/2021   LDLCALC 48 01/28/2021   TRIG 135 01/28/2021   CHOLHDL 2.2 01/28/2021   Lab Results  Component Value Date   TSH 2.134 05/01/2021   Lab Results  Component Value Date   HGBA1C 5.7 (H) 05/06/2021    IMPRESSION AND PLAN:  #1 history of labile hypertension.  Blood pressures have been good at home and low normal here in the last couple of checks.  Electrolytes and creatinine checked today.  Continue 12.5 mg Lopressor twice a day.  #2 hyperlipidemia in the setting of coronary artery disease.  Goal LDL less than 70 he is on room rosuvastatin 40 mg a day.  LDL was 48 6 months ago.  Lipid panel and hepatic panel checked today.  #3 coronary artery disease, history of CABG about 3 months ago. Asymptomatic.  Continue aspirin, Plavix, statin, and beta-blocker as per cardiology recommendations.  #4 chronic pain syndrome.  Getting periodic evaluation and lumbar spine injections via orthopedics.  I do prescribe him tramadol for nighttime use and this helps consistently.  Controlled substance contract signed today and urine drug screen obtained. No new prescription given today.  An After Visit Summary was printed and given to the patient.  FOLLOW UP: Return in about 6 months (around 02/08/2022) for annual CPE (fasting).  Signed:  Crissie Sickles, MD           08/11/2021

## 2021-08-13 LAB — DRUG MONITORING PANEL 376104, URINE
Amphetamines: NEGATIVE ng/mL (ref <500)
Barbiturates: NEGATIVE ng/mL (ref <300)
Benzodiazepines: NEGATIVE ng/mL (ref <100)
Cocaine Metabolite: NEGATIVE ng/mL (ref <150)
Desmethyltramadol: 1617 ng/mL — ABNORMAL HIGH (ref <100)
Opiates: NEGATIVE ng/mL (ref <100)
Oxycodone: NEGATIVE ng/mL (ref <100)
Tramadol: >10000 ng/mL — ABNORMAL HIGH (ref <100)

## 2021-08-13 LAB — DM TEMPLATE

## 2021-08-22 ENCOUNTER — Other Ambulatory Visit: Payer: Self-pay | Admitting: Gastroenterology

## 2021-09-03 ENCOUNTER — Ambulatory Visit: Payer: Medicare HMO | Admitting: Cardiology

## 2021-09-03 ENCOUNTER — Other Ambulatory Visit: Payer: Self-pay | Admitting: Family Medicine

## 2021-09-03 NOTE — Telephone Encounter (Signed)
Requesting: Tramadol Contract: 08/11/21 UDS: 08/11/21 Last Visit: 08/11/21 Next Visit: 02/09/22 Last Refill:08/05/21(90,0)  Please Advise. Med pending

## 2021-09-05 NOTE — Telephone Encounter (Signed)
Pt advised refill sent. °

## 2021-09-10 DIAGNOSIS — Z936 Other artificial openings of urinary tract status: Secondary | ICD-10-CM | POA: Diagnosis not present

## 2021-09-10 DIAGNOSIS — Z8551 Personal history of malignant neoplasm of bladder: Secondary | ICD-10-CM | POA: Diagnosis not present

## 2021-09-17 DIAGNOSIS — Z936 Other artificial openings of urinary tract status: Secondary | ICD-10-CM | POA: Diagnosis not present

## 2021-09-17 DIAGNOSIS — Z8551 Personal history of malignant neoplasm of bladder: Secondary | ICD-10-CM | POA: Diagnosis not present

## 2021-10-01 ENCOUNTER — Other Ambulatory Visit: Payer: Self-pay | Admitting: Dermatology

## 2021-10-01 ENCOUNTER — Ambulatory Visit: Payer: Medicare HMO | Admitting: Nurse Practitioner

## 2021-10-01 ENCOUNTER — Telehealth: Payer: Self-pay

## 2021-10-01 ENCOUNTER — Encounter: Payer: Self-pay | Admitting: Nurse Practitioner

## 2021-10-01 ENCOUNTER — Telehealth: Payer: Self-pay | Admitting: Dermatology

## 2021-10-01 VITALS — BP 118/80 | HR 63 | Ht 74.0 in | Wt 212.0 lb

## 2021-10-01 DIAGNOSIS — R1310 Dysphagia, unspecified: Secondary | ICD-10-CM | POA: Diagnosis not present

## 2021-10-01 DIAGNOSIS — K219 Gastro-esophageal reflux disease without esophagitis: Secondary | ICD-10-CM

## 2021-10-01 DIAGNOSIS — Z7902 Long term (current) use of antithrombotics/antiplatelets: Secondary | ICD-10-CM

## 2021-10-01 MED ORDER — PANTOPRAZOLE SODIUM 40 MG PO TBEC: 40.0000 mg | DELAYED_RELEASE_TABLET | Freq: Every day | ORAL | 1 refills | Status: DC

## 2021-10-01 NOTE — Progress Notes (Signed)
ASSESSMENT AND PLAN    # GERD, recent recurrent symptoms after running out of protonix prescription -Refill protonix 40 mg daily   # Solid food dysphagia, chronic. He has never discussed this with Dr. Havery Moros. He is concerned about having to frequently 'gag' up food.  --For further evaluation patient will be scheduled for EGD. The risks and benefits of EGD with possible biopsies were discussed with the patient who agrees to proceed. Dr. Havery Moros has no endoscopy time to perform this procedure in a timely manner.  Procedure will be scheduled with Dr. Candis Schatz.  --Advised patient to eat small bites, chew well with liquids in between bites to avoid food impaction.  # CAD , s/p PCI and subsequent CABG. On Plavix.  --Hold Plavix for 5 days before procedure - will instruct when and how to resume after procedure. Patient understands that there is a low but real risk of cardiovascular event such as heart attack, stroke, or embolism /  thrombosis, or ischemia while off Plavix. The patient consents to proceed. Will communicate by phone or EMR with patient's prescribing provider to confirm that holding Plavix is reasonable in this case.    # History of recurrent multiple adenomatous colon polyps . Declined genetic testing in past.  --Patient says he was told that he future colonoscopies would not be needed and I do not see him on recall list. However, I will ask Dr. Havery Moros to clarify.   # Urothelial bladder cancer, T2N0. He is s/p chemotherapy. Has urostomy.   HISTORY OF PRESENT ILLNESS    Chief Complaint : needs refill on GERD medication, swallowing problems  Brandon Haynes is a 81 y.o. male known to Dr. Havery Moros with a past medical history of GERD, adenomatous colon polyps HDL, CAD /PCI with subsequent CABG  x2 August 2022 , hypertension, gout .  Additional medical history as listed in [place] .    Brandon Haynes ran out of Protonix prescription a few weeks ago and developed regurgitation.  He has been taking some of his wife's supply as needed but cannot take it everyday due to her limited supply. He is here for a refill.  He mentions that he has had problems swallowing food, especially dry food, for a very long time .  Episodes of solid food dysphagia may occur several times a week .  He has to 'gag' up the food that won't go down. His weight is stable, actually up 5 pounds. Patient doesn't think he has ever discussed swallowing problems with Dr. Havery Moros.  He had an upper endoscopy in 2017 for evaluation of IDA, esophagus unremarkable at that time   [place] / PERTINENT STUDIES:   August 2021 colonoscopy --Two 3 to 6 mm polyps in the transverse colon, removed with a cold snare. Resected and retrieved. - Diverticulosis in the sigmoid colon. - Internal hemorrhoids. - The examination was otherwise normal. --Polyp path compatible with tubular adenomas   Current Medications, Allergies, Past Medical History, Past Surgical History, Family History and Social History were reviewed in [employer] record.     Current Outpatient Medications  Medication Sig Dispense Refill   acetaminophen (TYLENOL) 650 MG CR tablet Take 650 mg by mouth every 8 (eight) hours as needed for pain.     aspirin EC 81 MG tablet Take 81 mg by mouth daily.     Cholecalciferol (D3 PO) Take 1 capsule by mouth daily.     clopidogrel (PLAVIX) 75 MG tablet Take 1  tablet (75 mg total) by mouth daily. 90 tablet 3   colchicine 0.6 MG tablet 2 tabs po x 1, then 2 hours later take 1 tab.  Starting the following day take 1 tab po bid until gout flare is resolved. 20 tablet 3   desonide (DESOWEN) 0.05 % cream APPLY TO AFFECTED AREA EVERY DAY 60 g 2   EPINEPHrine 0.3 mg/0.3 mL IJ SOAJ injection Inject 0.3 mg into the muscle as needed for anaphylaxis.     gabapentin (NEURONTIN) 300 MG capsule Take 300 mg by mouth 2 (two) times daily.     hydrocortisone cream 1 % Apply 1  application topically 4 (four) times daily as needed for itching.     loratadine (CLARITIN) 10 MG tablet Take 20 mg by mouth daily.     metoprolol tartrate (LOPRESSOR) 25 MG tablet Take 0.5 tablets (12.5 mg total) by mouth 2 (two) times daily. 180 tablet 3   pantoprazole (PROTONIX) 40 MG tablet Take 1 tablet (40 mg total) by mouth daily. Please schedule an office visit for further refills. Thank you. 90 tablet 0   PREVIDENT 5000 BOOSTER PLUS 1.1 % PSTE Take 1 application by mouth as directed.  1   rosuvastatin (CRESTOR) 40 MG tablet Take 1 tablet (40 mg total) by mouth daily. 90 tablet 3   traMADol (ULTRAM) 50 MG tablet TAKE 1 TABLET BY MOUTH THREE TIMES A DAY AS NEEDED FOR PAIN 90 tablet 5   triamcinolone (NASACORT) 55 MCG/ACT AERO nasal inhaler Place 1 spray into the nose 2 (two) times daily as needed (allergies).     No current facility-administered medications for this visit.    Review of Systems: No chest pain. No shortness of breath. No urinary complaints.   PHYSICAL EXAM :    Wt Readings from Last 3 Encounters:  10/01/21 212 lb (96.2 kg)  08/11/21 207 lb 6.4 oz (94.1 kg)  07/31/21 204 lb 1.6 oz (92.6 kg)    BP 118/80    Pulse 63    Ht 6\' 2"  (1.88 m)    Wt 212 lb (96.2 kg)    BMI 27.22 kg/m  Constitutional:  Generally well appearing male in no acute distress. Psychiatric: Pleasant. Normal mood and affect. Behavior is normal. EENT: Pupils normal.  Conjunctivae are normal. No scleral icterus. Neck supple.  Cardiovascular: Normal rate, regular rhythm. No edema Pulmonary/chest: Effort normal and breath sounds normal. No wheezing, rales or rhonchi. Abdominal: Soft, nondistended, nontender. Bowel sounds active throughout. There are no masses palpable. No hepatomegaly.Urostomy >> light yellow urine Neurological: Alert and oriented to person place and time. Skin: Skin is warm and dry. No rashes noted.  Tye Savoy, NP  10/01/2021, 11:35 AM  Cc:  McGowen, [name],  MD

## 2021-10-01 NOTE — Patient Instructions (Signed)
If you are age 79 or older, your body mass index should be between 23-30. Your Body mass index is 27.22 kg/m. If this is out of the aforementioned range listed, please consider follow up with your Primary Care Provider. ________________________________________________________  The [place] [facility] providers would like to encourage you to use [facility] to communicate with providers for non-urgent requests or questions.  Due to long hold times on the telephone, sending your provider a message by [facility] may be a faster and more efficient way to get a response.  Please allow 48 business hours for a response.  Please remember that this is for non-urgent requests.  _______________________________________________________  Dennis Bast have been scheduled for an endoscopy. Please follow written instructions given to you at your visit today. If you use inhalers (even only as needed), please bring them with you on the day of your procedure.  We have have sent refills of Pantoprazole to your Pharmacy.  You will be contacted by our office prior to your procedure for directions on holding your Plavix.  If you do not hear from our office 1 week prior to your scheduled procedure, please call [phone] to discuss.   Follow up pending the results of your Endoscopy.  Thank you for entrusting me with your care and choosing [facility].  Tye Savoy, NP-C

## 2021-10-01 NOTE — Telephone Encounter (Signed)
° °  Name: Brandon Haynes  DOB: 18-Dec-1940  MRN: [id number]   Primary Cardiologist: Fransico Him, MD  Chart reviewed as part of pre-operative protocol coverage. Patient was contacted 10/01/2021 in reference to pre-operative risk assessment for pending surgery as outlined below.  NITESH PITSTICK was last seen on 05/23/2021 by Laurann Montana NP.  Since that day, SABINO DENNING has done well without chest pain or worsening dyspnea.  Patient was previously cleared in Sept to hold plavix for a different procedure. He is also cleared to hold plavix for 5 days prior to GI procedure and restart as soon as possible afterward at the discretion of his GI doctor.   Therefore, based on ACC/AHA guidelines, the patient would be at acceptable risk for the planned procedure without further cardiovascular testing.   The patient was advised that if he develops new symptoms prior to surgery to contact our office to arrange for a follow-up visit, and he verbalized understanding.  I will route this recommendation to the requesting party via Epic fax function and remove from pre-op pool. Please call with questions.  [place], [place] 10/01/2021, 1:41 PM

## 2021-10-01 NOTE — Telephone Encounter (Signed)
[facility] Pre-operative Risk Assessment     Request for surgical clearance:     Endoscopy Procedure  What type of surgery is being performed?     Endoscopy  When is this surgery scheduled?     10/13/2021  What type of clearance is required ?   Pharmacy  Are there any medications that need to be held prior to surgery and how long? Plavix starting 5 days prior  Practice name and name of physician performing surgery?      [facility]  What is your office phone and fax number?      Phone- [phone]  [phone]  Anesthesia type (None, local, MAC, general) ?       MAC

## 2021-10-01 NOTE — Progress Notes (Signed)
Agree with assessment and plan as outlined.  

## 2021-10-02 ENCOUNTER — Telehealth: Payer: Self-pay | Admitting: Cardiology

## 2021-10-02 NOTE — Telephone Encounter (Signed)
Patient called and says that he would not be able to make it to his virtual appt...Marland KitchenMarland KitchenMarland Kitchenwants to know what would be another day he could change it too?

## 2021-10-02 NOTE — Telephone Encounter (Signed)
Spoke with the patient and moved his appointment to 1/12

## 2021-10-09 ENCOUNTER — Encounter: Payer: Self-pay | Admitting: Cardiology

## 2021-10-09 ENCOUNTER — Telehealth (INDEPENDENT_AMBULATORY_CARE_PROVIDER_SITE_OTHER): Payer: Medicare HMO | Admitting: Cardiology

## 2021-10-09 VITALS — BP 130/76 | HR 78 | Ht 74.0 in | Wt 207.0 lb

## 2021-10-09 DIAGNOSIS — I25118 Atherosclerotic heart disease of native coronary artery with other forms of angina pectoris: Secondary | ICD-10-CM

## 2021-10-09 DIAGNOSIS — I10 Essential (primary) hypertension: Secondary | ICD-10-CM | POA: Diagnosis not present

## 2021-10-09 DIAGNOSIS — E7800 Pure hypercholesterolemia, unspecified: Secondary | ICD-10-CM | POA: Diagnosis not present

## 2021-10-09 NOTE — Progress Notes (Signed)
Virtual Visit via Video Note   This visit type was conducted due to national recommendations for restrictions regarding the COVID-19 Pandemic (e.g. social distancing) in an effort to limit this patient's exposure and mitigate transmission in our community.  Due to his co-morbid illnesses, this patient is at least at moderate risk for complications without adequate follow up.  This format is felt to be most appropriate for this patient at this time.  All issues noted in this document were discussed and addressed.  A limited physical exam was performed with this format.  Please refer to the patient's chart for his consent to telehealth for [facility].       Date:  10/09/2021   ID:  Brandon Haynes, DOB 07/13/1941, MRN [id number] The patient was identified using 2 identifiers.  Patient Location: Home Provider Location: Home Office   PCP:  McGowen, [name], MD   [facility] Providers Cardiologist:  Brandon Him, MD     Evaluation Performed:  Follow-Up Visit  Chief Complaint:  CAD, HTN, HLD  History of Present Illness:    Brandon Haynes is a 81 y.o. 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 The RCA was felt well collateralized and he underwent PCI of the LAD.  LV function was 55% at time of cath.  Echocardiogram revealed akinesis of the basal-mid inferoseptal myocardium and  mid-apical anteroseptal myocardium with EF 50-55%.    He subsequently developed recurrent chest pain in August 2022 with cath showing heavily calcified LAD, patent mid LAD stent with minimal stenosis with unsuccessful PCI and underwent CABG x2 on 05/06/2021.  This was complicated by postop thrombocytopenia and hypotension requiring midodrine.  He was seen back in the office postop and was doing well with except for an occasional dizzy spell with changes in position.  He is here today for followup and is doing well.  He denies any chest pain or pressure, SOB, DOE, PND, orthopnea, LE edema,  dizziness, palpitations or syncope. He is compliant with his meds and is tolerating meds with no SE.     The patient does not have symptoms concerning for COVID-19 infection (fever, chills, cough, or new shortness of breath).    Past Medical History:  Diagnosis Date   Allergic rhinitis    allergy shots via [place]   Anxiety    Bladder cancer ([place]) 05/2019   [id number].  Lesion resected. No mets.  He got neoadjuvant chemo x 4 cycles.  Cystoprostectomy with ileal conduit formation 12/13/19 (Dr. Tresa Moore). 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 difficile colitis 01/09/2020; late May 2021   oral vancomycin   CAD (coronary artery disease)    mid LAD stent 2016; CABG 04/2021   CAD, multiple vessel    a. CP/near-syncope 11/2014 s/p DES to MLAD, occluded RCA after takeoff of RV marginal with collaterals treated medically, otherwise mild nonobstructive disease. EF 55%;  b. 07/2015 Lexiscan CL: EF 56%, no ischemia/infarct.    Cataracts, both eyes    Chronic constipation    worse since ileal conduit surgery was done 2020   Chronic low back pain    DDD (degenerative disc disease), lumbar    GERD (gastroesophageal reflux disease)    PPI bid needed to control sx's   Gout    Hearing loss    History  of basal cell carcinoma    History of adenomatous polyp of colon  History of prostate cancer 11/2019   grd 2 cancer with neg margins incidental on cystectomy path 11/2019.   Hypercholesteremia    Hypertension    Hx labile/uncontrolled.  Improved s/p visit to HTN clinic 2019.   Iron deficiency anemia 2017   Colonoscopy, EGD, and capsule endoscopy w/out obvious cause/source found.  Oral iron helping as of 03/2016.   Leg pain 03/06/2016   Nonmelanoma skin cancer    Osteoarthritis of multiple joints 10/18/2014   Peripheral vascular disease (HCC)    Pleural thickening    chronic   Renal neoplasm 10/2019   Small, left kidney: <2 cm, 90% endophytic by CT  04/2019, stable 10/2019. No avid enhancement or mass effect. Obs by urol as of 10/2019 and 11/2020.   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 Vertebral artery flow is   CATARACT EXTRACTION, BILATERAL     Jan, Feb 2022   COLONOSCOPY  08/2008; 02/2016; 02/2017; 05/27/20   2017: 12 polyps (adenomatous).  02/2017 repeat TCS--multiple polyps. 04/2020 +adenomas.   CORONARY ANGIOPLASTY WITH STENT PLACEMENT  12/04/2014   4.0 x 12 mm Synergy stent to the mid LAD   CORONARY ARTERY BYPASS GRAFT N/A 05/06/2021   Procedure: OFF PUMP CORONARY ARTERY BYPASS GRAFTING (CABG) X TWO, USING LEFT INTERNAL MAMMARY ARTERY AND RIGHT LEG GREATER SAPHENOUS VEIN HARVESTED ENDOSCOPICALLY;  Surgeon: Lajuana Matte, MD;  Location: [place];  Service: Open Heart Surgery;  Laterality: N/A;   CYSTOSCOPY  05/24/2019   CYSTOSCOPY W/ TRANSURETHRAL RESECTION OF POSTERIOR URETHERAL VALVES  06/12/2019   2-5cm   CYSTOSCOPY WITH INJECTION N/A 12/13/2019   Procedure: CYSTOSCOPY WITH INJECTION;  Surgeon: [name], MD;  Location: [facility];  Service: Urology;  Laterality: N/A;   ESOPHAGOGASTRODUODENOSCOPY  02/2016   Gastritis.  NEG h pylori.     IR IMAGING GUIDED PORT INSERTION  09/12/2019   IR REMOVAL TUN ACCESS W/ PORT W/O FL MOD SED  08/20/2020   LE arterial vascular study  02/2016   Per Dr. Radford Pax, no evidence of peripheral vascular disease   LEFT HEART CATH AND CORONARY ANGIOGRAPHY N/A 05/01/2021   Procedure: LEFT HEART CATH AND CORONARY ANGIOGRAPHY;  Surgeon: Burnell Blanks, MD;  Location: [facility];  Service: Cardiovascular;  Laterality: N/A;   LEFT HEART CATHETERIZATION WITH CORONARY ANGIOGRAM N/A 12/04/2014    Procedure: LEFT HEART CATHETERIZATION WITH CORONARY ANGIOGRAM;  Surgeon: Jolaine Artist, MD; LAD 99% then aneurysmal, OM1 40%, CFX 50%, RCA 40%, 50% then occluded   LYMPHADENECTOMY Bilateral 12/13/2019   Procedure: LYMPHADENECTOMY;  Surgeon: [name], MD;  Location: [facility];  Service: Urology;  Laterality: Bilateral;   ROBOT ASSISTED LAPAROSCOPIC COMPLETE CYSTECT ILEAL CONDUIT N/A 12/13/2019   Procedure: XI ROBOTIC ASSISTED LAPAROSCOPIC COMPLETE CYSTECT ILEAL CONDUIT AND RADICAL PROSTATECTOMY;  Surgeon: [name], MD;  Location: [facility];  Service: Urology;  Laterality: N/A;  6 HRS   ROTATOR CUFF REPAIR Left 09/2013   left   SHOULDER INJECTION  10/2012   TEE WITHOUT CARDIOVERSION N/A 05/06/2021   Procedure: TRANSESOPHAGEAL ECHOCARDIOGRAM (TEE);  Surgeon: Lajuana Matte, MD;  Location: [place];  Service: Open Heart Surgery;  Laterality: N/A;  TRANSTHORACIC ECHOCARDIOGRAM  11/2014   EF 50-55%, some wall motion abnormalities noted, grade I diast dysfxn   TRANSTHORACIC ECHOCARDIOGRAM  05/01/2021   pre CABG, EF 55-50%, nl wm, grd I DD, valves fine.   TRANSURETHRAL RESECTION OF BLADDER TUMOR N/A 06/12/2019   INVASIVE HIGH GRADE PAPILLARY UROTHELIAL CARCINOMA, invading muscularis propria.  Procedure: TRANSURETHRAL RESECTION OF BLADDER TUMOR (TURBT);  Surgeon: Lucas Mallow, MD;  Location: [facility];  Service: Urology;  Laterality: N/A;     Current Meds  Medication Sig   acetaminophen (TYLENOL) 650 MG CR tablet Take 650 mg by mouth every 8 (eight) hours as needed for pain.   aspirin EC 81 MG tablet Take 81 mg by mouth daily.   Cholecalciferol (D3 PO) Take 1 capsule by mouth daily.   clopidogrel (PLAVIX) 75 MG tablet Take 1 tablet (75 mg total) by mouth daily.   desonide (DESOWEN) 0.05 % cream APPLY TO AFFECTED AREA EVERY DAY   EPINEPHrine 0.3 mg/0.3 mL IJ SOAJ injection Inject 0.3 mg into the muscle as needed for anaphylaxis.   loratadine (CLARITIN) 10 MG tablet Take 20 mg  by mouth daily.   metoprolol tartrate (LOPRESSOR) 25 MG tablet Take 0.5 tablets (12.5 mg total) by mouth 2 (two) times daily.   pantoprazole (PROTONIX) 40 MG tablet Take 1 tablet (40 mg total) by mouth daily. Please schedule an office visit for further refills. Thank you.   PREVIDENT 5000 BOOSTER PLUS 1.1 % PSTE Take 1 application by mouth as directed.   rosuvastatin (CRESTOR) 40 MG tablet Take 1 tablet (40 mg total) by mouth daily.   traMADol (ULTRAM) 50 MG tablet TAKE 1 TABLET BY MOUTH THREE TIMES A DAY AS NEEDED FOR PAIN   triamcinolone (NASACORT) 55 MCG/ACT AERO nasal inhaler Place 1 spray into the nose 2 (two) times daily as needed (allergies).     Allergies:   Patient has no known allergies.   Social History   Tobacco Use   Smoking status: Former    Packs/day: 2.00    Years: 35.00    Pack years: 70.00    Types: Cigarettes    Quit date: 09/28/1998    Years since quitting: 23.0   Smokeless tobacco: Former    Types: Snuff    Quit date: 02/09/1986   Tobacco comments:    as a teenager  Vaping Use   Vaping Use: Never used  Substance Use Topics   Alcohol use: No    Alcohol/week: 0.0 standard drinks    Comment: quit 1994   Drug use: No     Family Hx: The patient's family history includes Bladder Cancer in his maternal grandfather; Breast cancer in his mother and sister; COPD in his brother; Congestive Heart Failure in his mother; Diabetes in his mother; Heart disease in his brother and father; Lung cancer in his father. There is no history of Colon cancer, Esophageal cancer, Pancreatic cancer, Rectal cancer, Stomach cancer, Prostate cancer, or Colon polyps.  ROS:   Please see the history of present illness.     All other systems reviewed and are negative.   Prior CV studies:   The following studies were reviewed today:  Cardiac cath  Labs/Other Tests and Data Reviewed:    EKG:  No ECG reviewed.  Recent Labs: 04/30/2021: B Natriuretic Peptide 125.7 05/01/2021: TSH  2.134 05/07/2021: Magnesium 2.2 05/09/2021: Hemoglobin 10.3; Platelets 111 08/11/2021: ALT 9; BUN 17; Creatinine, Ser 1.07; Potassium 4.2; Sodium 142   Recent Lipid Panel Lab Results  Component Value Date/Time   CHOL 143 08/11/2021 11:03 AM   CHOL 116 03/20/2020 08:23 AM   TRIG 193.0 (H) 08/11/2021 11:03 AM   HDL 62.90 08/11/2021 11:03 AM   HDL 62 03/20/2020 08:23 AM   CHOLHDL 2 08/11/2021 11:03 AM   LDLCALC 41 08/11/2021 11:03 AM   LDLCALC 48 01/28/2021 11:47 AM    Wt Readings from Last 3 Encounters:  10/09/21 207 lb (93.9 kg)  10/01/21 212 lb (96.2 kg)  08/11/21 207 lb 6.4 oz (94.1 kg)     Risk Assessment/Calculations:          Objective:    Vital Signs:  BP 130/76    Pulse 78    Ht 6\' 2"  (1.88 m)    Wt 207 lb (93.9 kg)    BMI 26.58 kg/m    VITAL SIGNS:  reviewed GEN:  no acute distress EYES:  sclerae anicteric, EOMI - Extraocular Movements Intact RESPIRATORY:  normal respiratory effort, symmetric expansion CARDIOVASCULAR:  no peripheral edema SKIN:  no rash, lesions or ulcers. MUSCULOSKELETAL:  no obvious deformities. NEURO:  alert and oriented x 3, no obvious focal deficit PSYCH:  normal affect  ASSESSMENT & PLAN:    1.  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 The RCA was felt well collateralized and he underwent PCI of the LAD.   -He had recurrent chest pain with cath showing heavily calcified LAD, patent mid LAD stent with minimal stenosis with unsuccessful PCI and underwent CABG x2 on 05/06/2021. -He has been doing well since his CABG with no anginal symptoms -He will continue prescription drug management with aspirin, Plavix 75 mg daily, Crestor 40 mg daily and Lopressor 12.5 mg twice daily with as needed refills   2.  Hypertension -BP is adequately controlled on exam today -Continue prescription drug management with metoprolol 12.5 mg twice daily with as needed refills   3.  Hyperlipidemia  - his LDL goal is < 70.    -LDL was 41 and triglycerides 193 on 08/11/2021 but unclear if this this is a fasting lipid panel -Will repeat FLP and ALT -Continue prescription drug management with Crestor 40 mg daily with as needed refills     COVID-19 Education: The signs and symptoms of COVID-19 were discussed with the patient and how to seek care for testing (follow up with PCP or arrange E-visit).  The importance of social distancing was discussed today.  Time:   Today, I have spent 20 minutes with the patient with telehealth technology discussing the above problems.     Medication Adjustments/Labs and Tests Ordered: Current medicines are reviewed at length with the patient today.  Concerns regarding medicines are outlined above.   Tests Ordered: No orders of the defined types were placed in this encounter.   Medication Changes: No orders of the defined types were placed in this encounter.   Follow Up:  In Person in 1 year(s)  Signed, Brandon Him, MD  10/09/2021 9:12 AM    [place]

## 2021-10-09 NOTE — Addendum Note (Signed)
Addended by: Antonieta Iba on: 10/09/2021 09:40 AM   Modules accepted: Orders

## 2021-10-09 NOTE — Patient Instructions (Signed)
Medication Instructions:  Your physician recommends that you continue on your current medications as directed. Please refer to the Current Medication list given to you today.  *If you need a refill on your cardiac medications before your next appointment, please call your pharmacy*  Lab Work: Fasting lipids and ALT If you have labs (blood work) drawn today and your tests are completely normal, you will receive your results only by: [place] (if you have MyChart) OR A paper copy in the mail If you have any lab test that is abnormal or we need to change your treatment, we will call you to review the results.  Follow-Up: At [facility], you and your health needs are our priority.  As part of our continuing mission to provide you with exceptional heart care, we have created designated Provider Care Teams.  These Care Teams include your primary Cardiologist (physician) and Advanced Practice Providers (APPs -  Physician Assistants and Nurse Practitioners) who all work together to provide you with the care you need, when you need it.  Your next appointment:   1 year(s)  The format for your next appointment:   In Person  Provider:   Fransico Him, MD

## 2021-10-10 ENCOUNTER — Telehealth: Payer: Medicare HMO | Admitting: Cardiology

## 2021-10-13 ENCOUNTER — Ambulatory Visit (AMBULATORY_SURGERY_CENTER): Payer: Medicare HMO | Admitting: Gastroenterology

## 2021-10-13 ENCOUNTER — Encounter: Payer: Self-pay | Admitting: Gastroenterology

## 2021-10-13 ENCOUNTER — Other Ambulatory Visit: Payer: Self-pay

## 2021-10-13 VITALS — BP 118/75 | HR 66 | Temp 96.6°F | Resp 15 | Ht 74.0 in | Wt 212.0 lb

## 2021-10-13 DIAGNOSIS — R1310 Dysphagia, unspecified: Secondary | ICD-10-CM | POA: Diagnosis not present

## 2021-10-13 DIAGNOSIS — K2950 Unspecified chronic gastritis without bleeding: Secondary | ICD-10-CM | POA: Diagnosis not present

## 2021-10-13 DIAGNOSIS — Z936 Other artificial openings of urinary tract status: Secondary | ICD-10-CM | POA: Diagnosis not present

## 2021-10-13 DIAGNOSIS — K3189 Other diseases of stomach and duodenum: Secondary | ICD-10-CM | POA: Diagnosis not present

## 2021-10-13 DIAGNOSIS — K222 Esophageal obstruction: Secondary | ICD-10-CM | POA: Diagnosis not present

## 2021-10-13 DIAGNOSIS — Z8551 Personal history of malignant neoplasm of bladder: Secondary | ICD-10-CM | POA: Diagnosis not present

## 2021-10-13 DIAGNOSIS — K317 Polyp of stomach and duodenum: Secondary | ICD-10-CM

## 2021-10-13 DIAGNOSIS — K219 Gastro-esophageal reflux disease without esophagitis: Secondary | ICD-10-CM

## 2021-10-13 MED ORDER — SODIUM CHLORIDE 0.9 % IV SOLN: 500.0000 mL | Freq: Once | INTRAVENOUS | Status: DC

## 2021-10-13 NOTE — Progress Notes (Signed)
To PACU, VSS. Report to [email or web address] 

## 2021-10-13 NOTE — Patient Instructions (Addendum)
Handouts were given to your care partner on Gastritis. Restart PLAVIX at prior dose tomorrow 10/14/21. You may resume your other current medications today. Await biopsy results.  May take 1-3 weeks to receive pathology results. If difficulty swallowing does not improve with dilatation, consider esophageal manometry for further evaluation. Please call if any questions or concerns.      YOU HAD AN ENDOSCOPIC PROCEDURE TODAY AT [facility]:   Refer to the procedure report that was given to you for any specific questions about what was found during the examination.  If the procedure report does not answer your questions, please call your gastroenterologist to clarify.  If you requested that your care partner not be given the details of your procedure findings, then the procedure report has been included in a sealed envelope for you to review at your convenience later.  YOU SHOULD EXPECT: Some feelings of bloating in the abdomen. Passage of more gas than usual.  Walking can help get rid of the air that was put into your GI tract during the procedure and reduce the bloating. If you had a lower endoscopy (such as a colonoscopy or flexible sigmoidoscopy) you may notice spotting of blood in your stool or on the toilet paper. If you underwent a bowel prep for your procedure, you may not have a normal bowel movement for a few days.  Please Note:  You might notice some irritation and congestion in your nose or some drainage.  This is from the oxygen used during your procedure.  There is no need for concern and it should clear up in a day or so.  SYMPTOMS TO REPORT IMMEDIATELY:   Following upper endoscopy (EGD)  Vomiting of blood or coffee ground material  New chest pain or pain under the shoulder blades  Painful or persistently difficult swallowing  New shortness of breath  Fever of 100F or higher  Black, tarry-looking stools  For urgent or emergent issues, a gastroenterologist can be  reached at any hour by calling [phone]. Do not use MyChart messaging for urgent concerns.    DIET:  We do recommend a small meal at first, but then you may proceed to your regular diet.  Drink plenty of fluids but you should avoid alcoholic beverages for 24 hours.  ACTIVITY:  You should plan to take it easy for the rest of today and you should NOT DRIVE or use heavy machinery until tomorrow (because of the sedation medicines used during the test).    FOLLOW UP: Our staff will call the number listed on your records 48-72 hours following your procedure to check on you and address any questions or concerns that you may have regarding the information given to you following your procedure. If we do not reach you, we will leave a message.  We will attempt to reach you two times.  During this call, we will ask if you have developed any symptoms of COVID 19. If you develop any symptoms (ie: fever, flu-like symptoms, shortness of breath, cough etc.) before then, please call [phone].  If you test positive for Covid 19 in the 2 weeks post procedure, please call and report this information to [place].    If any biopsies were taken you will be contacted by phone or by letter within the next 1-3 weeks.  Please call us at [phone] if you have not heard about the biopsies in 3 weeks.    SIGNATURES/CONFIDENTIALITY: You and/or your care partner have signed paperwork  which will be entered into your electronic medical record.  These signatures attest to the fact that that the information above on your After Visit Summary has been reviewed and is understood.  Full responsibility of the confidentiality of this discharge information lies with you and/or your care-partner.

## 2021-10-13 NOTE — Progress Notes (Signed)
History and Physical Interval Note:  10/13/2021 10:00 AM  Brandon Haynes  has presented today for endoscopic procedure(s), with the diagnosis of  Encounter Diagnoses  Name Primary?   Dysphagia, unspecified type Yes   Gastroesophageal reflux disease, unspecified whether esophagitis present   .  The various methods of evaluation and treatment have been discussed with the patient and/or family. After consideration of risks, benefits and other options for treatment, the patient has consented to  the endoscopic procedure(s).   The patient's history has been reviewed, patient examined, no change in status, stable for endoscopic procedure(s).  I have reviewed the patient's chart and labs.  Questions were answered to the patient's satisfaction.     Zaylon Bossier E. Candis Schatz, MD [facility]

## 2021-10-13 NOTE — Op Note (Signed)
[facility] Patient Name: Brandon Haynes Procedure Date: 10/13/2021 10:00 AM MRN: [id number] Endoscopist: Nicki Reaper E. Candis Schatz , MD Age: 81 Referring MD:  Date of Birth: 1940/12/16 Gender: Male Account #: [id number] Procedure:                Upper GI endoscopy Indications:              Dysphagia Medicines:                Monitored Anesthesia Care Procedure:                Pre-Anesthesia Assessment:                           - Prior to the procedure, a History and Physical                            was performed, and patient medications and                            allergies were reviewed. The patient's tolerance of                            previous anesthesia was also reviewed. The risks                            and benefits of the procedure and the sedation                            options and risks were discussed with the patient.                            All questions were answered, and informed consent                            was obtained. Prior Anticoagulants: The patient has                            taken Plavix (clopidogrel), last dose was 6 days                            prior to procedure. ASA Grade Assessment: III - A                            patient with severe systemic disease. After                            reviewing the risks and benefits, the patient was                            deemed in satisfactory condition to undergo the                            procedure.  After obtaining informed consent, the endoscope was                            passed under direct vision. Throughout the                            procedure, the patient's blood pressure, pulse, and                            oxygen saturations were monitored continuously. The                            GIF [id number] #[id number] was introduced through the                            mouth, and advanced to the third part of duodenum.                             The upper GI endoscopy was accomplished without                            difficulty. The patient tolerated the procedure                            well. Scope In: Scope Out: Findings:                 The examined portions of the nasopharynx,                            oropharynx and larynx were normal.                           One benign-appearing, intrinsic mild stenosis was                            found 41 cm from the incisors. This stenosis                            measured 1.2 cm (inner diameter) x 1 cm (in                            length). The stenosis was traversed. A TTS dilator                            was passed through the scope. Dilation with a                            16-17-18 mm balloon dilator was performed to 18 mm.                            The dilation site was examined and showed mild  improvement in luminal narrowing. Estimated blood                            loss: none.                           The Z-line was irregular.                           The exam of the esophagus was otherwise normal.                           Diffuse granular mucosa was found in the gastric                            body and in the gastric antrum. Biopsies were taken                            with a cold forceps for Helicobacter pylori                            testing. Estimated blood loss was minimal.                           Two 2 to 3 mm sessile polyps were found in the                            gastric body. The polyp was removed with a cold                            biopsy forceps. Resection and retrieval were                            complete. Estimated blood loss was minimal.                           The exam of the stomach was otherwise normal.                           The examined duodenum was normal. Complications:            No immediate complications. Estimated Blood Loss:     Estimated blood loss was minimal. Impression:                - The examined portions of the nasopharynx,                            oropharynx and larynx were normal.                           - Benign-appearing esophageal stenosis. Dilated.                           - Z-line irregular.                           -  Granular gastric mucosa. Biopsied.                           - Two gastric polyps. Resected and retrieved.                           - Normal examined duodenum. Recommendation:           - Patient has a contact number available for                            emergencies. The signs and symptoms of potential                            delayed complications were discussed with the                            patient. Return to normal activities tomorrow.                            Written discharge instructions were provided to the                            patient.                           - Resume previous diet.                           - Resume Plavix (clopidogrel) at prior dose                            tomorrow.                           - Await pathology results.                           - If dysphagia not improved with dilation, consider                            esophageal manometry for further evaluation. Iness Pangilinan E. Candis Schatz, MD 10/13/2021 10:37:10 AM This report has been signed electronically.

## 2021-10-13 NOTE — Progress Notes (Signed)
Per Dr. Candis Schatz, pt does not need esophageal dilatation diet.  Pt may resume prior diet.  Restart PLAVIX tomorrow at prior dose.  No problems noted in the recovery room. maw

## 2021-10-13 NOTE — Progress Notes (Signed)
Called to room to assist during endoscopic procedure.  Patient ID and intended procedure confirmed with present staff. Received instructions for my participation in the procedure from the performing physician.  

## 2021-10-15 ENCOUNTER — Other Ambulatory Visit: Payer: Self-pay

## 2021-10-15 ENCOUNTER — Telehealth: Payer: Self-pay

## 2021-10-15 ENCOUNTER — Other Ambulatory Visit: Payer: Medicare HMO | Admitting: *Deleted

## 2021-10-15 DIAGNOSIS — I10 Essential (primary) hypertension: Secondary | ICD-10-CM

## 2021-10-15 DIAGNOSIS — I25118 Atherosclerotic heart disease of native coronary artery with other forms of angina pectoris: Secondary | ICD-10-CM

## 2021-10-15 DIAGNOSIS — E7800 Pure hypercholesterolemia, unspecified: Secondary | ICD-10-CM | POA: Diagnosis not present

## 2021-10-15 LAB — LIPID PANEL
Chol/HDL Ratio: 2.3 ratio (ref 0.0–5.0)
Cholesterol, Total: 126 mg/dL (ref 100–199)
HDL: 54 mg/dL (ref >39)
LDL Chol Calc (NIH): 49 mg/dL (ref 0–99)
Triglycerides: 130 mg/dL (ref 0–149)
VLDL Cholesterol Cal: 23 mg/dL (ref 5–40)

## 2021-10-15 LAB — ALT: ALT: 9 IU/L (ref 0–44)

## 2021-10-15 NOTE — Telephone Encounter (Signed)
°  Follow up Call-  Call back number 10/13/2021 05/27/2020  Post procedure Call Back phone  # [phone]  Permission to leave phone message Yes Yes  Some recent data might be hidden     Patient questions:  Do you have a fever, pain , or abdominal swelling? No. Pain Score  0 *  Have you tolerated food without any problems? Yes.    Have you been able to return to your normal activities? Yes.    Do you have any questions about your discharge instructions: Diet   No. Medications  No. Follow up visit  No.  Do you have questions or concerns about your Care? No.  Actions: * If pain score is 4 or above: No action needed, pain <4.

## 2021-10-18 ENCOUNTER — Encounter: Payer: Self-pay | Admitting: Gastroenterology

## 2021-10-31 ENCOUNTER — Other Ambulatory Visit: Payer: Self-pay

## 2021-10-31 DIAGNOSIS — I25118 Atherosclerotic heart disease of native coronary artery with other forms of angina pectoris: Secondary | ICD-10-CM

## 2021-10-31 MED ORDER — METOPROLOL TARTRATE 25 MG PO TABS: 12.5000 mg | ORAL_TABLET | Freq: Two times a day (BID) | ORAL | 3 refills | Status: DC

## 2021-10-31 MED ORDER — ROSUVASTATIN CALCIUM 40 MG PO TABS: 40.0000 mg | ORAL_TABLET | Freq: Every day | ORAL | 3 refills | Status: DC

## 2021-10-31 NOTE — Telephone Encounter (Signed)
Pt called in requesting refills on Metoprolol, Rosuvastatin and Losartan. I have sent all requested meds to pt's pharmacy except for the Losartan. Pt states that he has been off Losartan for awhile and it is no longer on his list. He would like to go back on it because his blood pressure has been running high. Pt was previously taking Losartan 50 mg daily. Please advise

## 2021-11-05 ENCOUNTER — Telehealth: Payer: Self-pay

## 2021-11-05 NOTE — Telephone Encounter (Signed)
Brandon Margarita, MD     Please have him check his BP twice daily for a week and call with resuilts       Brandon Haynes, CMA 5 days ago   Pt called in requesting refills on Metoprolol, Rosuvastatin and Losartan. I have sent all requested meds to pt's pharmacy except for the Losartan. Pt states that he has been off Losartan for awhile and it is no longer on his list. He would like to go back on it because his blood pressure has been running high. Pt was previously taking Losartan 50 mg daily. Please advise      Note    Spoke with the patient who states that his BP has been running high at times. He states that when it his high he takes 1/2 tablet of his losartan with his Lopressor. Advised patient to take his BP and heart rate twice daily for one week per Dr. Radford Pax and call us with a list of his readings. Patient verbalized understanding.

## 2021-11-05 NOTE — Telephone Encounter (Signed)
Spoke with the patient and advised him to take his BP and HR for a week and call us with a list of his readings.

## 2021-11-12 DIAGNOSIS — Z8551 Personal history of malignant neoplasm of bladder: Secondary | ICD-10-CM | POA: Diagnosis not present

## 2021-11-12 DIAGNOSIS — Z936 Other artificial openings of urinary tract status: Secondary | ICD-10-CM | POA: Diagnosis not present

## 2021-11-12 NOTE — Telephone Encounter (Signed)
Pt c/o BP issue: STAT if pt c/o blurred vision, one-sided weakness or slurred speech  1. What are your last 5 BP readings?   2/08:    122/68 (3:15 PM)  135/77 (10:00 PM)  2/09:    141/78 (8:45 AM)  148/76 (6:00 PM)  2/10:    140/81 (7:30 AM)  120/68 (7:00 PM)   2/11:    128/80 (11:00 AM  134/71 (8:00 PM)  2/12:    120/73 (8:00 AM)  119/72 (8:00 PM)  2/13:    142/86 (8:00 AM)  120/72 (8:00 AM)  2/14:    135/79 (9:30 AM)  132/80 (9:00 PM)  2. Are you having any other symptoms (ex. Dizziness, headache, blurred vision, passed out)?   3. What is your BP issue?   Patient is following up to provide BP readings.

## 2021-11-12 NOTE — Telephone Encounter (Signed)
Spoke with the patient and advised him to continue with his current medications per Dr. Radford Pax. Patient verbalized understanding.

## 2021-12-23 DIAGNOSIS — Z936 Other artificial openings of urinary tract status: Secondary | ICD-10-CM | POA: Diagnosis not present

## 2021-12-23 DIAGNOSIS — Z8551 Personal history of malignant neoplasm of bladder: Secondary | ICD-10-CM | POA: Diagnosis not present

## 2022-01-19 ENCOUNTER — Ambulatory Visit (HOSPITAL_COMMUNITY)
Admission: RE | Admit: 2022-01-19 | Discharge: 2022-01-19 | Disposition: A | Discharge status: Home / Self Care | Payer: Medicare HMO | Source: Ambulatory Visit | Attending: Urology | Admitting: Urology

## 2022-01-19 ENCOUNTER — Other Ambulatory Visit (HOSPITAL_COMMUNITY): Payer: Self-pay | Admitting: Urology

## 2022-01-19 DIAGNOSIS — C672 Malignant neoplasm of lateral wall of bladder: Secondary | ICD-10-CM | POA: Insufficient documentation

## 2022-01-19 DIAGNOSIS — C61 Malignant neoplasm of prostate: Secondary | ICD-10-CM | POA: Diagnosis not present

## 2022-01-19 DIAGNOSIS — Z8551 Personal history of malignant neoplasm of bladder: Secondary | ICD-10-CM | POA: Diagnosis not present

## 2022-01-19 LAB — PSA: PSA: <0.015

## 2022-01-21 DIAGNOSIS — N281 Cyst of kidney, acquired: Secondary | ICD-10-CM | POA: Diagnosis not present

## 2022-01-21 DIAGNOSIS — C672 Malignant neoplasm of lateral wall of bladder: Secondary | ICD-10-CM | POA: Diagnosis not present

## 2022-01-21 DIAGNOSIS — K5730 Diverticulosis of large intestine without perforation or abscess without bleeding: Secondary | ICD-10-CM | POA: Diagnosis not present

## 2022-01-21 DIAGNOSIS — Z8551 Personal history of malignant neoplasm of bladder: Secondary | ICD-10-CM | POA: Diagnosis not present

## 2022-01-21 DIAGNOSIS — N2889 Other specified disorders of kidney and ureter: Secondary | ICD-10-CM | POA: Diagnosis not present

## 2022-01-26 DIAGNOSIS — C672 Malignant neoplasm of lateral wall of bladder: Secondary | ICD-10-CM | POA: Diagnosis not present

## 2022-01-26 DIAGNOSIS — R8271 Bacteriuria: Secondary | ICD-10-CM | POA: Diagnosis not present

## 2022-01-28 NOTE — Telephone Encounter (Signed)
x

## 2022-02-09 ENCOUNTER — Encounter: Payer: Medicare HMO | Admitting: Family Medicine

## 2022-02-09 ENCOUNTER — Telehealth: Payer: Self-pay

## 2022-02-09 NOTE — Telephone Encounter (Signed)
Patient called to cancel medicare well visit.  He declined to reschedule.  Per patient request please do not call to reschedule. ?

## 2022-02-10 DIAGNOSIS — Z8551 Personal history of malignant neoplasm of bladder: Secondary | ICD-10-CM | POA: Diagnosis not present

## 2022-02-10 DIAGNOSIS — Z936 Other artificial openings of urinary tract status: Secondary | ICD-10-CM | POA: Diagnosis not present

## 2022-02-11 ENCOUNTER — Ambulatory Visit: Payer: Medicare HMO

## 2022-02-13 ENCOUNTER — Encounter: Payer: Self-pay | Admitting: Family Medicine

## 2022-02-13 ENCOUNTER — Ambulatory Visit (INDEPENDENT_AMBULATORY_CARE_PROVIDER_SITE_OTHER): Payer: Medicare HMO | Admitting: Family Medicine

## 2022-02-13 VITALS — BP 138/70 | HR 60 | Temp 98.0°F | Ht 75.25 in | Wt 212.0 lb

## 2022-02-13 DIAGNOSIS — M5441 Lumbago with sciatica, right side: Secondary | ICD-10-CM

## 2022-02-13 DIAGNOSIS — G8929 Other chronic pain: Secondary | ICD-10-CM | POA: Diagnosis not present

## 2022-02-13 DIAGNOSIS — E7800 Pure hypercholesterolemia, unspecified: Secondary | ICD-10-CM | POA: Diagnosis not present

## 2022-02-13 DIAGNOSIS — Z0000 Encounter for general adult medical examination without abnormal findings: Secondary | ICD-10-CM

## 2022-02-13 DIAGNOSIS — R0989 Other specified symptoms and signs involving the circulatory and respiratory systems: Secondary | ICD-10-CM

## 2022-02-13 DIAGNOSIS — G894 Chronic pain syndrome: Secondary | ICD-10-CM

## 2022-02-13 MED ORDER — COLCHICINE 0.6 MG PO TABS: ORAL_TABLET | ORAL | 3 refills | Status: DC

## 2022-02-13 MED ORDER — GABAPENTIN 300 MG PO CAPS: 300.0000 mg | ORAL_CAPSULE | Freq: Three times a day (TID) | ORAL | 1 refills | Status: DC

## 2022-02-13 MED ORDER — TRAMADOL HCL 50 MG PO TABS: ORAL_TABLET | ORAL | 5 refills | Status: DC

## 2022-02-13 NOTE — Progress Notes (Signed)
Office Note 02/13/2022  CC:  Chief Complaint  Patient presents with   Annual Exam    Pt is fasting   Patient is a 81 y.o. male who is here for annual health maintenance exam and 6 mo f/u labile HTN, HLD, chronic pain syndrome. A/P as of last visit: "#1 history of labile hypertension.  Blood pressures have been good at home and low normal here in the last couple of checks.  Electrolytes and creatinine checked today.  Continue 12.5 mg Lopressor twice a day.   #2 hyperlipidemia in the setting of coronary artery disease.  Goal LDL less than 70 he is on room rosuvastatin 40 mg a day.  LDL was 48 6 months ago.  Lipid panel and hepatic panel checked today.   #3 coronary artery disease, history of CABG about 3 months ago. Asymptomatic.  Continue aspirin, Plavix, statin, and beta-blocker as per cardiology recommendations.   #4 chronic pain syndrome.  Getting periodic evaluation and lumbar spine injections via orthopedics.  I do prescribe him tramadol for nighttime use and this helps consistently.  Controlled substance contract signed today and urine drug screen obtained. No new prescription given today."  INTERIM Hx: He is feeling fine.  No acute complaints. He is learning to know how far he can push himself physically before having to rest and hydrate.  Chronic pain: Lumbar degenerative disc disease with intermittent right-sided sciatica.  Has received epidural steroid injections in the past and these have been somewhat helpful.  I have prescribed him tramadol and small amounts long-term and he has done well with this. Takes 1 tab with a tylenol tab three times a day. He was on gabapentin until a couple of months ago and it had been helping. At this was originally prescribed by his neurosurgeon but when he requested refill they declined. PMP AWARE reviewed today: most recent rx for tramadol was filled 02/04/2022, #90, rx by me. No red flags.  Regular home blood pressure check shows  average of 130/80. No low blood pressures.   Past Medical History:  Diagnosis Date   Allergic rhinitis    allergy shots via [place]   Anxiety    Bladder cancer ([place]) 05/2019   [id number].  Lesion resected. No mets.  He got neoadjuvant chemo x 4 cycles.  Cystoprostectomy with ileal conduit formation 12/13/19 (Dr. Tresa Moore). 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 difficile colitis 01/09/2020; late May 2021   oral vancomycin   CAD (coronary artery disease)    mid LAD stent 2016; CABG 04/2021   CAD, multiple vessel    a. CP/near-syncope 11/2014 s/p DES to MLAD, occluded RCA after takeoff of RV marginal with collaterals treated medically, otherwise mild nonobstructive disease. EF 55%;  b. 07/2015 Lexiscan CL: EF 56%, no ischemia/infarct.    Cataracts, both eyes    Chronic constipation    worse since ileal conduit surgery was done 2020   Chronic low back pain    DDD (degenerative disc disease), lumbar    GERD (gastroesophageal reflux disease)    PPI bid needed to control sx's   Gout    Hearing loss    History  of basal cell carcinoma    History of adenomatous polyp of colon    History of prostate cancer 11/2019   grd 2 cancer with neg margins incidental on cystectomy path 11/2019.   Hypercholesteremia    Hypertension    Hx labile/uncontrolled.  Improved s/p visit to HTN  clinic 2019.   Iron deficiency anemia 2017   Colonoscopy, EGD, and capsule endoscopy w/out obvious cause/source found.  Oral iron helping as of 03/2016.   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 No avid enhancement or mass effect. Obs by urol as of 10/2019 and 11/2020.   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 Vertebral artery flow is   CATARACT EXTRACTION, BILATERAL     Jan, Feb 2022   COLONOSCOPY  08/2008; 02/2016; 02/2017; 05/27/20   2017: 12 polyps (adenomatous).  02/2017 repeat TCS--multiple polyps. 04/2020 +adenomas.   CORONARY ANGIOPLASTY WITH STENT PLACEMENT  12/04/2014   4.0 x 12 mm Synergy stent to the mid LAD   CORONARY ARTERY BYPASS GRAFT N/A 05/06/2021   Procedure: OFF PUMP CORONARY ARTERY BYPASS GRAFTING (CABG) X TWO, USING LEFT INTERNAL MAMMARY ARTERY AND RIGHT LEG GREATER SAPHENOUS VEIN HARVESTED ENDOSCOPICALLY;  Surgeon: Lajuana Matte, MD;  Location: [facility];  Service: Open Heart Surgery;  Laterality: N/A;   CYSTOSCOPY  05/24/2019   CYSTOSCOPY W/ TRANSURETHRAL RESECTION OF POSTERIOR URETHERAL VALVES  06/12/2019   2-5cm   CYSTOSCOPY WITH INJECTION N/A 12/13/2019   Procedure: CYSTOSCOPY WITH INJECTION;  Surgeon: [name], MD;  Location: [facility];  Service: Urology;  Laterality: N/A;   ESOPHAGOGASTRODUODENOSCOPY  02/2016   Gastritis.  NEG h pylori.     IR IMAGING GUIDED PORT INSERTION  09/12/2019   IR REMOVAL TUN ACCESS W/ PORT W/O FL MOD SED  08/20/2020   LE arterial vascular study  02/2016   Per Dr. Radford Pax, no evidence of peripheral vascular disease   LEFT HEART CATH AND CORONARY ANGIOGRAPHY N/A 05/01/2021   Procedure: LEFT HEART CATH AND CORONARY ANGIOGRAPHY;  Surgeon: Burnell Blanks, MD;  Location: [facility];  Service: Cardiovascular;  Laterality: N/A;   LEFT HEART CATHETERIZATION WITH CORONARY ANGIOGRAM N/A 12/04/2014   Procedure: LEFT HEART CATHETERIZATION WITH CORONARY ANGIOGRAM;  Surgeon: Jolaine Artist, MD; LAD 99% then aneurysmal, OM1 40%, CFX 50%, RCA 40%, 50% then occluded    LYMPHADENECTOMY Bilateral 12/13/2019   Procedure: LYMPHADENECTOMY;  Surgeon: [name], MD;  Location: [facility];  Service: Urology;  Laterality: Bilateral;   ROBOT ASSISTED LAPAROSCOPIC COMPLETE CYSTECT ILEAL CONDUIT N/A 12/13/2019   Procedure: XI ROBOTIC ASSISTED LAPAROSCOPIC COMPLETE CYSTECT ILEAL CONDUIT AND RADICAL PROSTATECTOMY;  Surgeon: [name], MD;  Location: [facility];  Service: Urology;  Laterality: N/A;  6 HRS   ROTATOR CUFF REPAIR Left 09/2013   left   SHOULDER INJECTION  10/2012   TEE WITHOUT CARDIOVERSION N/A 05/06/2021   Procedure: TRANSESOPHAGEAL ECHOCARDIOGRAM (TEE);  Surgeon: Lajuana Matte, MD;  Location: [facility];  Service: Open Heart Surgery;  Laterality: N/A;   TRANSTHORACIC ECHOCARDIOGRAM  11/2014   EF 50-55%, some wall motion abnormalities noted, grade I diast dysfxn   TRANSTHORACIC ECHOCARDIOGRAM  05/01/2021   pre CABG, EF 55-50%, nl wm, grd I DD,  valves fine.   TRANSURETHRAL RESECTION OF BLADDER TUMOR N/A 06/12/2019   INVASIVE HIGH GRADE PAPILLARY UROTHELIAL CARCINOMA, invading muscularis propria.  Procedure: TRANSURETHRAL RESECTION OF BLADDER TUMOR (TURBT);  Surgeon: Lucas Mallow, MD;  Location: [facility];  Service: Urology;  Laterality: N/A;    Family History  Problem Relation Age of Onset   Lung cancer Father    Heart disease Father    Breast cancer Mother    Congestive Heart Failure Mother    Diabetes Mother    Breast cancer Sister    Heart disease Brother    COPD Brother    Bladder Cancer Maternal Grandfather    Colon cancer Neg Hx    Esophageal cancer Neg Hx    Pancreatic cancer Neg Hx    Rectal cancer Neg Hx    Stomach cancer Neg Hx    Prostate cancer Neg Hx    Colon polyps Neg Hx     Social History   Socioeconomic History   Marital status: Married    Spouse name: Vaughan Basta   Number of children: 3   Years of education: Not on file   Highest education level: Not on file  Occupational History   Occupation: retired  Tobacco Use    Smoking status: Former    Packs/day: 2.00    Years: 35.00    Pack years: 70.00    Types: Cigarettes    Quit date: 09/28/1998    Years since quitting: 23.3   Smokeless tobacco: Former    Types: Snuff    Quit date: 02/09/1986   Tobacco comments:    as a teenager  Vaping Use   Vaping Use: Never used  Substance and Sexual Activity   Alcohol use: No    Alcohol/week: 0.0 standard drinks    Comment: quit 1994   Drug use: No   Sexual activity: Not Currently  Other Topics Concern   Not on file  Social History Narrative   Married, 3 children.   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   Orig from [place], lived [place] same farm all his life.   Former smoker: quit approx around [place].  80 pack-yr hx.   Alcohol-none, used to drink a lot of beer on weekends.  No drugs.   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   Diet: fair   Social Determinants of Radio broadcast assistant Strain: Not on file  Food Insecurity: Not on file  Transportation Needs: Not on file  Physical Activity: Not on file  Stress: Not on file  Social Connections: Not on file  Intimate Partner Violence: Not on file    Outpatient Medications Prior to Visit  Medication Sig Dispense Refill   acetaminophen (TYLENOL) 650 MG CR tablet Take 650 mg by mouth every 8 (eight) hours as needed for pain.     aspirin EC 81 MG tablet Take 81 mg by mouth daily.     Cholecalciferol (D3 PO) Take 1 capsule by mouth daily.     clopidogrel (PLAVIX) 75 MG tablet Take 1 tablet (75 mg total) by mouth daily. 90 tablet 3   colchicine 0.6 MG tablet 2 tabs po x 1, then 2 hours later take 1 tab.  Starting the following day take 1 tab po bid until gout flare is resolved. 20 tablet 3   desonide (DESOWEN) 0.05 % cream APPLY TO AFFECTED AREA EVERY DAY 60 g 2   hydrocortisone cream 1 % Apply 1 application. topically 4 (  four) times daily as needed for itching.     loratadine (CLARITIN) 10 MG tablet Take 20 mg by mouth  daily.     metoprolol tartrate (LOPRESSOR) 25 MG tablet Take 0.5 tablets (12.5 mg total) by mouth 2 (two) times daily. 90 tablet 3   pantoprazole (PROTONIX) 40 MG tablet Take 1 tablet (40 mg total) by mouth daily. Please schedule an office visit for further refills. Thank you. 90 tablet 1   PREVIDENT 5000 BOOSTER PLUS 1.1 % PSTE Take 1 application by mouth as directed.  1   rosuvastatin (CRESTOR) 40 MG tablet Take 1 tablet (40 mg total) by mouth daily. 90 tablet 3   traMADol (ULTRAM) 50 MG tablet TAKE 1 TABLET BY MOUTH THREE TIMES A DAY AS NEEDED FOR PAIN 90 tablet 5   triamcinolone (NASACORT) 55 MCG/ACT AERO nasal inhaler Place 1 spray into the nose 2 (two) times daily as needed (allergies).     EPINEPHrine 0.3 mg/0.3 mL IJ SOAJ injection Inject 0.3 mg into the muscle as needed for anaphylaxis. (Patient not taking: Reported on 10/13/2021)     gabapentin (NEURONTIN) 300 MG capsule Take 300 mg by mouth 2 (two) times daily. (Patient not taking: Reported on 02/13/2022)     No facility-administered medications prior to visit.    No Known Allergies  ROS Review of Systems  Constitutional:  Negative for appetite change, chills, fatigue and fever.  HENT:  Negative for congestion, dental problem, ear pain and sore throat.   Eyes:  Negative for discharge, redness and visual disturbance.  Respiratory:  Negative for cough, chest tightness, shortness of breath and wheezing.   Cardiovascular:  Negative for chest pain, palpitations and leg swelling.  Gastrointestinal:  Negative for abdominal pain, blood in stool, diarrhea, nausea and vomiting.  Genitourinary:  Negative for difficulty urinating, dysuria, flank pain, frequency, hematuria and urgency.  Musculoskeletal:  Negative for arthralgias, back pain, joint swelling, myalgias and neck stiffness.  Skin:  Negative for pallor and rash.  Neurological:  Negative for dizziness, speech difficulty, weakness and headaches.  Hematological:  Negative for  adenopathy. Does not bruise/bleed easily.  Psychiatric/Behavioral:  Negative for confusion and sleep disturbance. The patient is not nervous/anxious.    PE;    02/13/2022   10:01 AM 10/13/2021   10:53 AM 10/13/2021   10:43 AM  Vitals with BMI  Height 6' 3.25"    Weight 212 lbs    BMI 24.40    Systolic 102 725 366  Diastolic 70 75 76  Pulse 60 66 64    Gen: Alert, well appearing.  Patient is oriented to person, place, time, and situation. AFFECT: pleasant, lucid thought and speech. ENT: Ears: EACs clear, normal epithelium.  TMs with good light reflex and landmarks bilaterally.  Eyes: no injection, icteris, swelling, or exudate.  EOMI, PERRLA. Nose: no drainage or turbinate edema/swelling.  No injection or focal lesion.  Mouth: lips without lesion/swelling.  Oral mucosa pink and moist.  Dentition intact and without obvious caries or gingival swelling.  Oropharynx without erythema, exudate, or swelling.  Neck: supple/nontender.  No LAD, mass, or TM.  Carotid pulses 2+ bilaterally, without bruits. CV: RRR, distant S1 and S2, no m/r/g.   LUNGS: CTA bilat, nonlabored resps, good aeration in all lung fields. ABD: soft, NT, ND, BS normal.  No hepatospenomegaly or mass.  No bruits. EXT: no clubbing, cyanosis, or edema.  Musculoskeletal: no joint swelling, erythema, warmth, or tenderness.  ROM of all joints intact. Skin - no sores  or suspicious lesions or rashes or color changes  Pertinent labs:  Lab Results  Component Value Date   TSH 2.134 05/01/2021   Lab Results  Component Value Date   WBC 8.1 05/09/2021   HGB 10.3 (L) 05/09/2021   HCT 33.1 (L) 05/09/2021   MCV 95.1 05/09/2021   PLT 111 (L) 05/09/2021   Lab Results  Component Value Date   IRON 201 (H) 08/18/2016   FERRITIN 74.6 08/18/2016   Lab Results  Component Value Date   CREATININE 1.07 08/11/2021   BUN 17 08/11/2021   NA 142 08/11/2021   K 4.2 08/11/2021   CL 105 08/11/2021   CO2 29 08/11/2021   Lab Results   Component Value Date   ALT 9 10/15/2021   AST 15 08/11/2021   ALKPHOS 53 08/11/2021   BILITOT 0.5 08/11/2021   Lab Results  Component Value Date   CHOL 126 10/15/2021   Lab Results  Component Value Date   HDL 54 10/15/2021   Lab Results  Component Value Date   LDLCALC 49 10/15/2021   Lab Results  Component Value Date   TRIG 130 10/15/2021   Lab Results  Component Value Date   CHOLHDL 2.3 10/15/2021   Lab Results  Component Value Date   PSA <0.015 01/19/2022   PSA <0.015 11/12/2020   PSA <0.015 06/04/2020   Lab Results  Component Value Date   HGBA1C 5.7 (H) 05/06/2021   ASSESSMENT AND PLAN:   #1 hypertension, well controlled on 12.5 mg Lopressor twice daily.  2.  Hyperlipidemia.  Goal LDL less than 70.  Doing well on Crestor 40 mg a day. LDL was 49 at 5 months ago. Lipid panel and hepatic panel today.  3 chronic pain syndrome.  Lumbar degenerative disc disease/spondylosis with intermittent right-sided sciatica. Continue tramadol 50 mg 1 3 times daily with the Tylenol tab each time. We will restart gabapentin 300 mg, start with 1 a day and work gradually up to 1 3 times a day. He is currently in the process of establishing with a new orthopedic provider.  4. coronary artery disease, history of CABG about 9 months ago. Asymptomatic.  Continue aspirin, Plavix, statin, and beta-blocker as per cardiology recommendations.  5. Health maintenance exam: Reviewed age and gender appropriate health maintenance issues (prudent diet, regular exercise, health risks of tobacco and excessive alcohol, use of seatbelts, fire alarms in home, use of sunscreen).  Also reviewed age and gender appropriate health screening as well as vaccine recommendations. Vaccines: ALL UTD. Labs: CMET, FLP, CBC. Prostate ca screening: n/a d/t hx of prostatectomy. Colon ca screening: hx of polyps, most recently 05/27/20.  Recall 3 yrs.  An After Visit Summary was printed and given to the  patient.  FOLLOW UP:  Return for routine chronic illness f/u.  Signed:  Crissie Sickles, MD           02/13/2022

## 2022-02-13 NOTE — Patient Instructions (Addendum)
Health Maintenance, Male Adopting a healthy lifestyle and getting preventive care are important in promoting health and wellness. Ask your health care provider about: The right schedule for you to have regular tests and exams. Things you can do on your own to prevent diseases and keep yourself healthy. What should I know about diet, weight, and exercise? Eat a healthy diet  Eat a diet that includes plenty of vegetables, fruits, low-fat dairy products, and lean protein. Do not eat a lot of foods that are high in solid fats, added sugars, or sodium. Maintain a healthy weight Body mass index (BMI) is a measurement that can be used to identify possible weight problems. It estimates body fat based on height and weight. Your health care provider can help determine your BMI and help you achieve or maintain a healthy weight. Get regular exercise Get regular exercise. This is one of the most important things you can do for your health. Most adults should: Exercise for at least 150 minutes each week. The exercise should increase your heart rate and make you sweat (moderate-intensity exercise). Do strengthening exercises at least twice a week. This is in addition to the moderate-intensity exercise. Spend less time sitting. Even light physical activity can be beneficial. Watch cholesterol and blood lipids Have your blood tested for lipids and cholesterol at 79 years of age, then have this test every 5 years. You may need to have your cholesterol levels checked more often if: Your lipid or cholesterol levels are high. You are older than 79 years of age. You are at high risk for heart disease. What should I know about cancer screening? Many types of cancers can be detected early and may often be prevented. Depending on your health history and family history, you may need to have cancer screening at various ages. This may include screening for: Colorectal cancer. Prostate cancer. Skin cancer. Lung  cancer. What should I know about heart disease, diabetes, and high blood pressure? Blood pressure and heart disease High blood pressure causes heart disease and increases the risk of stroke. This is more likely to develop in people who have high blood pressure readings or are overweight. Talk with your health care provider about your target blood pressure readings. Have your blood pressure checked: Every 3-5 years if you are 18-39 years of age. Every year if you are 40 years old or older. If you are between the ages of 65 and 75 and are a current or former smoker, ask your health care provider if you should have a one-time screening for abdominal aortic aneurysm (AAA). Diabetes Have regular diabetes screenings. This checks your fasting blood sugar level. Have the screening done: Once every three years after age 45 if you are at a normal weight and have a low risk for diabetes. More often and at a younger age if you are overweight or have a high risk for diabetes. What should I know about preventing infection? Hepatitis B If you have a higher risk for hepatitis B, you should be screened for this virus. Talk with your health care provider to find out if you are at risk for hepatitis B infection. Hepatitis C Blood testing is recommended for: Everyone born from 1945 through 1965. Anyone with known risk factors for hepatitis C. Sexually transmitted infections (STIs) You should be screened each year for STIs, including gonorrhea and chlamydia, if: You are sexually active and are younger than 79 years of age. You are older than 79 years of age and your   health care provider tells you that you are at risk for this type of infection. Your sexual activity has changed since you were last screened, and you are at increased risk for chlamydia or gonorrhea. Ask your health care provider if you are at risk. Ask your health care provider about whether you are at high risk for HIV. Your health care provider  may recommend a prescription medicine to help prevent HIV infection. If you choose to take medicine to prevent HIV, you should first get tested for HIV. You should then be tested every 3 months for as long as you are taking the medicine. Follow these instructions at home: Alcohol use Do not drink alcohol if your health care provider tells you not to drink. If you drink alcohol: Limit how much you have to 0-2 drinks a day. Know how much alcohol is in your drink. In the [place] one drink equals one 12 oz bottle of beer (355 mL), one 5 oz glass of wine (148 mL), or one 1 oz glass of hard liquor (44 mL). Lifestyle Do not use any products that contain nicotine or tobacco. These products include cigarettes, chewing tobacco, and vaping devices, such as e-cigarettes. If you need help quitting, ask your health care provider. Do not use street drugs. Do not share needles. Ask your health care provider for help if you need support or information about quitting drugs. General instructions Schedule regular health, dental, and eye exams. Stay current with your vaccines. Tell your health care provider if: You often feel depressed. You have ever been abused or do not feel safe at home. Summary Adopting a healthy lifestyle and getting preventive care are important in promoting health and wellness. Follow your health care provider's instructions about healthy diet, exercising, and getting tested or screened for diseases. Follow your health care provider's instructions on monitoring your cholesterol and blood pressure. This information is not intended to replace advice given to you by your health care provider. Make sure you discuss any questions you have with your health care provider. Document Revised: 02/03/2021 Document Reviewed: 02/03/2021 Elsevier Patient Education  2023 [employer]  

## 2022-02-14 ENCOUNTER — Encounter: Payer: Self-pay | Admitting: Oncology

## 2022-02-14 LAB — COMPREHENSIVE METABOLIC PANEL
AG Ratio: 2 (calc) (ref 1.0–2.5)
ALT: 9 U/L (ref 9–46)
AST: 15 U/L (ref 10–35)
Albumin: 4.4 g/dL (ref 3.6–5.1)
Alkaline phosphatase (APISO): 49 U/L (ref 35–144)
BUN: 19 mg/dL (ref 7–25)
CO2: 25 mmol/L (ref 20–32)
Calcium: 9.2 mg/dL (ref 8.6–10.3)
Chloride: 107 mmol/L (ref 98–110)
Creat: 1.15 mg/dL (ref 0.70–1.28)
Globulin: 2.2 g/dL (calc) (ref 1.9–3.7)
Glucose, Bld: 87 mg/dL (ref 65–99)
Potassium: 4.5 mmol/L (ref 3.5–5.3)
Sodium: 143 mmol/L (ref 135–146)
Total Bilirubin: 0.4 mg/dL (ref 0.2–1.2)
Total Protein: 6.6 g/dL (ref 6.1–8.1)

## 2022-02-14 LAB — LIPID PANEL
Cholesterol: 119 mg/dL (ref <200)
HDL: 57 mg/dL (ref >=40)
LDL Cholesterol (Calc): 41 mg/dL (calc)
Non-HDL Cholesterol (Calc): 62 mg/dL (calc) (ref <130)
Total CHOL/HDL Ratio: 2.1 (calc) (ref <5.0)
Triglycerides: 130 mg/dL (ref <150)

## 2022-02-14 LAB — CBC
HCT: 40.5 % (ref 38.5–50.0)
Hemoglobin: 13 g/dL — ABNORMAL LOW (ref 13.2–17.1)
MCH: 28.8 pg (ref 27.0–33.0)
MCHC: 32.1 g/dL (ref 32.0–36.0)
MCV: 89.6 fL (ref 80.0–100.0)
MPV: 11.3 fL (ref 7.5–12.5)
Platelets: 192 10*3/uL (ref 140–400)
RBC: 4.52 10*6/uL (ref 4.20–5.80)
RDW: 15 % (ref 11.0–15.0)
WBC: 7.3 10*3/uL (ref 3.8–10.8)

## 2022-03-03 ENCOUNTER — Other Ambulatory Visit: Payer: Self-pay | Admitting: Nurse Practitioner

## 2022-03-05 ENCOUNTER — Other Ambulatory Visit: Payer: Self-pay

## 2022-03-05 ENCOUNTER — Inpatient Hospital Stay: Payer: Medicare HMO | Attending: Oncology | Admitting: Oncology

## 2022-03-05 VITALS — BP 120/79 | HR 68 | Temp 97.9°F | Resp 16 | Ht 75.5 in | Wt 211.2 lb

## 2022-03-05 DIAGNOSIS — D649 Anemia, unspecified: Secondary | ICD-10-CM | POA: Insufficient documentation

## 2022-03-05 DIAGNOSIS — C679 Malignant neoplasm of bladder, unspecified: Secondary | ICD-10-CM | POA: Insufficient documentation

## 2022-03-05 DIAGNOSIS — N2889 Other specified disorders of kidney and ureter: Secondary | ICD-10-CM | POA: Diagnosis not present

## 2022-03-05 NOTE — Progress Notes (Signed)
Hematology and Oncology Follow Up Visit  Brandon Haynes [id number] Jun 24, 1941 81 y.o. 03/05/2022 10:20 AM McGowen, [name], MDMcGowen, [name], MD   Principle Diagnosis: 81 year old man with bladder cancer diagnosed in September 2020.  He was found to have T2N0 high-grade urothelial carcinoma at the time of diagnosis without any residual disease after surgical resection.   Prior Therapy:  He is status post TURBT on 06/12/2019 which showed high grade invasive urothelial carcinoma invading into the muscularis propria.  Neoadjuvant chemotherapy utilizing cisplatin and gemcitabine.  He completed 4 cycles of chemotherapy on October 20, 2019.  He is status post radical cystectomy and lymphadenectomy completed on December 13, 2019.  Final pathology showed The carcinoma in situ with 0 out of 12 lymph node involvement.    Current therapy: Active surveillance.    Interim History: Brandon Haynes is here for a follow-up visit.  Since last visit, he reports feeling well without any major complaints.  He denies any hospitalizations or illnesses.  He denies any decline in energy or performance status.  He denies any abdominal pain, flank pain or hematuria.  His quality of life remained excellent.     Medications: Updated on review. Current Outpatient Medications  Medication Sig Dispense Refill   acetaminophen (TYLENOL) 650 MG CR tablet Take 650 mg by mouth every 8 (eight) hours as needed for pain.     aspirin EC 81 MG tablet Take 81 mg by mouth daily.     Cholecalciferol (D3 PO) Take 1 capsule by mouth daily.     clopidogrel (PLAVIX) 75 MG tablet Take 1 tablet (75 mg total) by mouth daily. 90 tablet 3   colchicine 0.6 MG tablet 2 tabs po x 1, then 2 hours later take 1 tab.  Starting the following day take 1 tab po bid until gout flare is resolved. 20 tablet 3   desonide (DESOWEN) 0.05 % cream APPLY TO AFFECTED AREA EVERY DAY 60 g 2   EPINEPHrine 0.3 mg/0.3 mL IJ SOAJ injection Inject 0.3 mg into the  muscle as needed for anaphylaxis. (Patient not taking: Reported on 10/13/2021)     gabapentin (NEURONTIN) 300 MG capsule Take 1 capsule (300 mg total) by mouth 3 (three) times daily. 270 capsule 1   hydrocortisone cream 1 % Apply 1 application. topically 4 (four) times daily as needed for itching.     loratadine (CLARITIN) 10 MG tablet Take 20 mg by mouth daily.     metoprolol tartrate (LOPRESSOR) 25 MG tablet Take 0.5 tablets (12.5 mg total) by mouth 2 (two) times daily. (Patient not taking: Reported on 02/13/2022) 90 tablet 3   pantoprazole (PROTONIX) 40 MG tablet Take 1 tablet (40 mg total) by mouth daily. Please schedule an office visit for further refills. Thank you. 90 tablet 1   PREVIDENT 5000 BOOSTER PLUS 1.1 % PSTE Take 1 application by mouth as directed.  1   rosuvastatin (CRESTOR) 40 MG tablet Take 1 tablet (40 mg total) by mouth daily. 90 tablet 3   traMADol (ULTRAM) 50 MG tablet TAKE 1 TABLET BY MOUTH THREE TIMES A DAY AS NEEDED FOR PAIN 90 tablet 5   triamcinolone (NASACORT) 55 MCG/ACT AERO nasal inhaler Place 1 spray into the nose 2 (two) times daily as needed (allergies).     No current facility-administered medications for this visit.     Allergies: No Known Allergies      Physical Exam:    Blood pressure 120/79, pulse 68, temperature 97.9 F (36.6 C), temperature  source Oral, resp. rate 16, height 6' 3.5" (1.918 m), weight 211 lb 3.2 oz (95.8 kg), SpO2 96 %.     ECOG: 0    General appearance: Comfortable appearing without any discomfort Head: Normocephalic without any trauma Oropharynx: Mucous membranes are moist and pink without any thrush or ulcers. Eyes: Pupils are equal and round reactive to light. Lymph nodes: No cervical, supraclavicular, inguinal or axillary lymphadenopathy.   Heart:regular rate and rhythm.  S1 and S2 without leg edema. Lung: Clear without any rhonchi or wheezes.  No dullness to percussion. Abdomin: Soft, nontender, nondistended with  good bowel sounds.  No hepatosplenomegaly. Musculoskeletal: No joint deformity or effusion.  Full range of motion noted. Neurological: No deficits noted on motor, sensory and deep tendon reflex exam. Skin: No petechial rash or dryness.  Appeared moist.           Lab Results: Lab Results  Component Value Date   WBC 7.3 02/13/2022   HGB 13.0 (L) 02/13/2022   HCT 40.5 02/13/2022   MCV 89.6 02/13/2022   PLT 192 02/13/2022   PSA <0.015 01/19/2022     Chemistry      Component Value Date/Time   NA 143 02/13/2022 1036   NA 143 03/13/2019 1141   K 4.5 02/13/2022 1036   CL 107 02/13/2022 1036   CO2 25 02/13/2022 1036   BUN 19 02/13/2022 1036   BUN 11 03/13/2019 1141   CREATININE 1.15 02/13/2022 1036      Component Value Date/Time   CALCIUM 9.2 02/13/2022 1036   ALKPHOS 53 08/11/2021 1103   AST 15 02/13/2022 1036   AST 17 07/31/2020 1005   ALT 9 02/13/2022 1036   ALT 14 07/31/2020 1005   BILITOT 0.4 02/13/2022 1036   BILITOT 0.5 07/31/2020 623      81 year old man with:   1. 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  He completed neoadjuvant chemotherapy and surgical resection without any residual disease.   The natural course of his disease was discussed at this time and risk of relapse was assessed.  Imaging studies completed and April 2023 were personally reviewed and showed no evidence of metastatic disease.  Salvage therapy options including immunotherapy antibody drug conjugate among others will be deferred unless he has recurrent disease.  I recommended continued surveillance currently receiving that under the care of Dr. Tresa Moore.      3.  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  His hemoglobin is back to normal at 13.0 on Feb 13, 2022.  4.  Left kidney mass: He is currently under active surveillance with Dr. Bess Harvest.  Recent imaging study showed slight growth.  6.   Follow-up: In 12 months for repeat evaluation.   30 minutes  were spent on this visit.  The time was dedicated to reviewing laboratory data, disease status update and outlining future plan of care discussion.     Zola Button, MD 6/8/202310:20 AM

## 2022-03-06 ENCOUNTER — Other Ambulatory Visit: Payer: Self-pay | Admitting: Nurse Practitioner

## 2022-03-06 ENCOUNTER — Other Ambulatory Visit: Payer: Self-pay | Admitting: Family Medicine

## 2022-03-09 NOTE — Telephone Encounter (Signed)
Pt confirmed he is still taking Allopurinol '300mg'$ . Last refilled on 02/12/22 (90,0)

## 2022-03-09 NOTE — Telephone Encounter (Signed)
LM for pt regarding medication ?

## 2022-03-09 NOTE — Telephone Encounter (Signed)
Requesting: tramadol Contract: 08/01/21 UDS: 08/01/21 Last Visit: 02/13/22 Next Visit: 08/18/22 Last Refill: 02/13/22(90,5)  Please Advise. Med pending  RF request for allopurinol LOV: 02/13/22 Next ov: 08/18/22 Last written: 05/15/21-05/23/21 (90,3) pt reported not taking on 05/23/21 and med d/c by another CMA

## 2022-03-09 NOTE — Telephone Encounter (Signed)
I thought that he was not taking the allopurinol anymore as well. Please clarify with patient, just in case this is an automatic refill request from the pharmacy.

## 2022-03-25 ENCOUNTER — Ambulatory Visit (INDEPENDENT_AMBULATORY_CARE_PROVIDER_SITE_OTHER): Payer: Medicare HMO

## 2022-03-25 DIAGNOSIS — Z0000 Encounter for general adult medical examination without abnormal findings: Secondary | ICD-10-CM | POA: Diagnosis not present

## 2022-03-25 NOTE — Patient Instructions (Signed)
Brandon Haynes , Thank you for taking time to come for your Medicare Wellness Visit. I appreciate your ongoing commitment to your health goals. Please review the following plan we discussed and let me know if I can assist you in the future.   Screening recommendations/referrals: Colonoscopy: 05/27/20 repeat every 3 years  Recommended yearly ophthalmology/optometry visit for glaucoma screening and checkup Recommended yearly dental visit for hygiene and checkup  Vaccinations: Influenza vaccine: done 08/11/21 repeat every year  Pneumococcal vaccine: Up to date Tdap vaccine: Done 08/13/21 repeat every 10 years  Shingles vaccine: Completed   1/24, 02/04/18 Covid-19: Completed 1/26, 2/26, 08/05/20  Advanced directives: Please bring a copy of your health care power of attorney and living will to the office at your convenience.  Conditions/risks identified: maintain healthy BMI  Next appointment: Follow up in one year for your annual wellness visit.   Preventive Care 79 Years and Older, Male Preventive care refers to lifestyle choices and visits with your health care provider that can promote health and wellness. What does preventive care include? A yearly physical exam. This is also called an annual well check. Dental exams once or twice a year. Routine eye exams. Ask your health care provider how often you should have your eyes checked. Personal lifestyle choices, including: Daily care of your teeth and gums. Regular physical activity. Eating a healthy diet. Avoiding tobacco and drug use. Limiting alcohol use. Practicing safe sex. Taking low doses of aspirin every day. Taking vitamin and mineral supplements as recommended by your health care provider. What happens during an annual well check? The services and screenings done by your health care provider during your annual well check will depend on your age, overall health, lifestyle risk factors, and family history of disease. Counseling   Your health care provider may ask you questions about your: Alcohol use. Tobacco use. Drug use. Emotional well-being. Home and relationship well-being. Sexual activity. Eating habits. History of falls. Memory and ability to understand (cognition). Work and work Statistician. Screening  You may have the following tests or measurements: Height, weight, and BMI. Blood pressure. Lipid and cholesterol levels. These may be checked every 5 years, or more frequently if you are over 52 years old. Skin check. Lung cancer screening. You may have this screening every year starting at age 36 if you have a 30-pack-year history of smoking and currently smoke or have quit within the past 15 years. Fecal occult blood test (FOBT) of the stool. You may have this test every year starting at age 33. Flexible sigmoidoscopy or colonoscopy. You may have a sigmoidoscopy every 5 years or a colonoscopy every 10 years starting at age 13. Prostate cancer screening. Recommendations will vary depending on your family history and other risks. Hepatitis C blood test. Hepatitis B blood test. Sexually transmitted disease (STD) testing. Diabetes screening. This is done by checking your blood sugar (glucose) after you have not eaten for a while (fasting). You may have this done every 1-3 years. Abdominal aortic aneurysm (AAA) screening. You may need this if you are a current or former smoker. Osteoporosis. You may be screened starting at age 65 if you are at high risk. Talk with your health care provider about your test results, treatment options, and if necessary, the need for more tests. Vaccines  Your health care provider may recommend certain vaccines, such as: Influenza vaccine. This is recommended every year. Tetanus, diphtheria, and acellular pertussis (Tdap, Td) vaccine. You may need a Td booster every 10 years.  Zoster vaccine. You may need this after age 21. Pneumococcal 13-valent conjugate (PCV13) vaccine.  One dose is recommended after age 26. Pneumococcal polysaccharide (PPSV23) vaccine. One dose is recommended after age 48. Talk to your health care provider about which screenings and vaccines you need and how often you need them. This information is not intended to replace advice given to you by your health care provider. Make sure you discuss any questions you have with your health care provider. Document Released: 10/11/2015 Document Revised: 06/03/2016 Document Reviewed: 07/16/2015 Elsevier Interactive Patient Education  2017 [place] Prevention in the Home Falls can cause injuries. They can happen to people of all ages. There are many things you can do to make your home safe and to help prevent falls. What can I do on the outside of my home? Regularly fix the edges of walkways and driveways and fix any cracks. Remove anything that might make you trip as you walk through a door, such as a raised step or threshold. Trim any bushes or trees on the path to your home. Use bright outdoor lighting. Clear any walking paths of anything that might make someone trip, such as rocks or tools. Regularly check to see if handrails are loose or broken. Make sure that both sides of any steps have handrails. Any raised decks and porches should have guardrails on the edges. Have any leaves, snow, or ice cleared regularly. Use sand or salt on walking paths during winter. Clean up any spills in your garage right away. This includes oil or grease spills. What can I do in the bathroom? Use night lights. Install grab bars by the toilet and in the tub and shower. Do not use towel bars as grab bars. Use non-skid mats or decals in the tub or shower. If you need to sit down in the shower, use a plastic, non-slip stool. Keep the floor dry. Clean up any water that spills on the floor as soon as it happens. Remove soap buildup in the tub or shower regularly. Attach bath mats securely with double-sided  non-slip rug tape. Do not have throw rugs and other things on the floor that can make you trip. What can I do in the bedroom? Use night lights. Make sure that you have a light by your bed that is easy to reach. Do not use any sheets or blankets that are too big for your bed. They should not hang down onto the floor. Have a firm chair that has side arms. You can use this for support while you get dressed. Do not have throw rugs and other things on the floor that can make you trip. What can I do in the kitchen? Clean up any spills right away. Avoid walking on wet floors. Keep items that you use a lot in easy-to-reach places. If you need to reach something above you, use a strong step stool that has a grab bar. Keep electrical cords out of the way. Do not use floor polish or wax that makes floors slippery. If you must use wax, use non-skid floor wax. Do not have throw rugs and other things on the floor that can make you trip. What can I do with my stairs? Do not leave any items on the stairs. Make sure that there are handrails on both sides of the stairs and use them. Fix handrails that are broken or loose. Make sure that handrails are as long as the stairways. Check any carpeting to make sure that  it is firmly attached to the stairs. Fix any carpet that is loose or worn. Avoid having throw rugs at the top or bottom of the stairs. If you do have throw rugs, attach them to the floor with carpet tape. Make sure that you have a light switch at the top of the stairs and the bottom of the stairs. If you do not have them, ask someone to add them for you. What else can I do to help prevent falls? Wear shoes that: Do not have high heels. Have rubber bottoms. Are comfortable and fit you well. Are closed at the toe. Do not wear sandals. If you use a stepladder: Make sure that it is fully opened. Do not climb a closed stepladder. Make sure that both sides of the stepladder are locked into place. Ask  someone to hold it for you, if possible. Clearly mark and make sure that you can see: Any grab bars or handrails. First and last steps. Where the edge of each step is. Use tools that help you move around (mobility aids) if they are needed. These include: Canes. Walkers. Scooters. Crutches. Turn on the lights when you go into a dark area. Replace any light bulbs as soon as they burn out. Set up your furniture so you have a clear path. Avoid moving your furniture around. If any of your floors are uneven, fix them. If there are any pets around you, be aware of where they are. Review your medicines with your doctor. Some medicines can make you feel dizzy. This can increase your chance of falling. Ask your doctor what other things that you can do to help prevent falls. This information is not intended to replace advice given to you by your health care provider. Make sure you discuss any questions you have with your health care provider. Document Released: 07/11/2009 Document Revised: 02/20/2016 Document Reviewed: 10/19/2014 Elsevier Interactive Patient Education  2017 [employer].

## 2022-03-25 NOTE — Progress Notes (Cosign Needed Addendum)
Virtual Visit via Telephone Note  I connected with  Brandon Haynes on 03/25/22 at  1:45 PM EDT by telephone and verified that I am speaking with the correct person using two identifiers.  Medicare Annual Wellness visit completed telephonically due to Covid-19 pandemic.   Persons participating in this call: This Health Coach and this patient.   Location: Patient: Home Provider: Office   I discussed the limitations, risks, security and privacy concerns of performing an evaluation and management service by telephone and the availability of in person appointments. The patient expressed understanding and agreed to proceed.  Unable to perform video visit due to video visit attempted and failed and/or patient does not have video capability.   Some vital signs may be absent or patient reported.   Willette Brace, LPN   Subjective:   Brandon Haynes is a 81 y.o. male who presents for Medicare Annual/Subsequent preventive examination.  Review of Systems     Cardiac Risk Factors include: advanced age (>62mn, >>21women);male gender;hypertension;dyslipidemia     Objective:    There were no vitals filed for this visit. There is no height or weight on file to calculate BMI.     03/25/2022    1:46 PM 04/30/2021   12:22 PM 02/05/2021    2:05 PM 08/20/2020    1:34 PM 02/20/2020    1:00 AM 02/19/2020    3:24 PM 01/08/2020    3:34 PM  Advanced Directives  Does Patient Have a Medical Advance Directive? Yes No Yes Yes No No Yes  Type of [place]amed[place] will   [place] Does patient want to make changes to medical advance directive?    No - Patient declined   Yes (ED - Information included in AVS)  Copy of [place] Chart? No - copy requested  No - copy requested No - copy requested   Yes - validated most recent copy scanned in chart (See row  information)  Would patient like information on creating a medical advance directive?  No - Patient declined   No - Patient declined      Current Medications (verified) Outpatient Encounter Medications as of 03/25/2022  Medication Sig   acetaminophen (TYLENOL) 650 MG CR tablet Take 650 mg by mouth every 8 (eight) hours as needed for pain.   allopurinol (ZYLOPRIM) 300 MG tablet TAKE 1 TABLET BY MOUTH EVERY DAY   aspirin EC 81 MG tablet Take 81 mg by mouth daily.   Cholecalciferol (D3 PO) Take 1 capsule by mouth daily.   clopidogrel (PLAVIX) 75 MG tablet Take 1 tablet (75 mg total) by mouth daily.   colchicine 0.6 MG tablet 2 tabs po x 1, then 2 hours later take 1 tab.  Starting the following day take 1 tab po bid until gout flare is resolved.   desonide (DESOWEN) 0.05 % cream APPLY TO AFFECTED AREA EVERY DAY   gabapentin (NEURONTIN) 300 MG capsule Take 1 capsule (300 mg total) by mouth 3 (three) times daily.   hydrocortisone cream 1 % Apply 1 application. topically 4 (four) times daily as needed for itching.   loratadine (CLARITIN) 10 MG tablet Take 20 mg by mouth daily.   metoprolol tartrate (LOPRESSOR) 25 MG tablet Take 0.5 tablets (12.5 mg total) by mouth 2 (two) times daily.   pantoprazole (PROTONIX) 40 MG tablet Take 1 tablet (40 mg total) by  mouth daily.   PREVIDENT 5000 BOOSTER PLUS 1.1 % PSTE Take 1 application by mouth as directed.   rosuvastatin (CRESTOR) 40 MG tablet Take 1 tablet (40 mg total) by mouth daily.   traMADol (ULTRAM) 50 MG tablet TAKE 1 TABLET BY MOUTH THREE TIMES A DAY AS NEEDED FOR PAIN   triamcinolone (NASACORT) 55 MCG/ACT AERO nasal inhaler Place 1 spray into the nose 2 (two) times daily as needed (allergies).   EPINEPHrine 0.3 mg/0.3 mL IJ SOAJ injection Inject 0.3 mg into the muscle as needed for anaphylaxis. (Patient not taking: Reported on 10/13/2021)   No facility-administered encounter medications on file as of 03/25/2022.    Allergies (verified) Patient has  no known allergies.   History: Past Medical History:  Diagnosis Date   Allergic rhinitis    allergy shots via [place]   Anxiety    Bladder cancer ([place]) 05/2019   [id number].  Lesion resected. No mets.  He got neoadjuvant chemo x 4 cycles.  Cystoprostectomy with ileal conduit formation 12/13/19 (Dr. Tresa Moore). 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 difficile colitis 01/09/2020; late May 2021   oral vancomycin   CAD (coronary artery disease)    mid LAD stent 2016; CABG 04/2021   CAD, multiple vessel    a. CP/near-syncope 11/2014 s/p DES to MLAD, occluded RCA after takeoff of RV marginal with collaterals treated medically, otherwise mild nonobstructive disease. EF 55%;  b. 07/2015 Lexiscan CL: EF 56%, no ischemia/infarct.    Cataracts, both eyes    Chronic constipation    worse since ileal conduit surgery was done 2020   Chronic low back pain    DDD (degenerative disc disease), lumbar    GERD (gastroesophageal reflux disease)    PPI bid needed to control sx's   Gout    Hearing loss    History  of basal cell carcinoma    History of adenomatous polyp of colon    History of prostate cancer 11/2019   grd 2 cancer with neg margins incidental on cystectomy path 11/2019.   Hypercholesteremia    Hypertension    Hx labile/uncontrolled.  Improved s/p visit to HTN clinic 2019.   Iron deficiency anemia 2017   Colonoscopy, EGD, and capsule endoscopy w/out obvious cause/source found.  Oral iron helping as of 03/2016.   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 No avid enhancement or mass effect. Obs by urol as of 10/2019 and 11/2020.   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 Vertebral artery flow is   CATARACT EXTRACTION, BILATERAL     Jan, Feb 2022   COLONOSCOPY  08/2008; 02/2016; 02/2017; 05/27/20   2017: 12 polyps (adenomatous).  02/2017 repeat TCS--multiple polyps. 04/2020 +adenomas.   CORONARY ANGIOPLASTY WITH STENT PLACEMENT  12/04/2014   4.0 x 12 mm Synergy stent to the mid LAD   CORONARY ARTERY BYPASS GRAFT  N/A 05/06/2021   Procedure: OFF PUMP CORONARY ARTERY BYPASS GRAFTING (CABG) X TWO, USING LEFT INTERNAL MAMMARY ARTERY AND RIGHT LEG GREATER SAPHENOUS VEIN HARVESTED ENDOSCOPICALLY;  Surgeon: Lajuana Matte, MD;  Location: [facility];  Service: Open Heart Surgery;  Laterality: N/A;   CYSTOSCOPY  05/24/2019   CYSTOSCOPY W/ TRANSURETHRAL RESECTION OF POSTERIOR URETHERAL VALVES  06/12/2019   2-5cm   CYSTOSCOPY WITH INJECTION N/A 12/13/2019   Procedure: CYSTOSCOPY WITH INJECTION;  Surgeon: [name], MD;  Location: [facility];  Service: Urology;  Laterality: N/A;   ESOPHAGOGASTRODUODENOSCOPY  02/2016   Gastritis.  NEG h pylori.     IR IMAGING GUIDED PORT INSERTION  09/12/2019   IR REMOVAL TUN ACCESS W/ PORT W/O FL MOD SED  08/20/2020   LE arterial vascular study  02/2016   Per Dr. Radford Pax, no evidence of peripheral vascular disease   LEFT HEART CATH AND CORONARY ANGIOGRAPHY N/A 05/01/2021   Procedure: LEFT HEART CATH AND CORONARY ANGIOGRAPHY;  Surgeon: Burnell Blanks, MD;  Location: [facility];  Service: Cardiovascular;  Laterality: N/A;   LEFT HEART CATHETERIZATION WITH CORONARY ANGIOGRAM N/A 12/04/2014   Procedure: LEFT HEART CATHETERIZATION WITH CORONARY ANGIOGRAM;  Surgeon: Jolaine Artist, MD; LAD 99% then aneurysmal, OM1 40%, CFX 50%, RCA 40%, 50% then occluded   LYMPHADENECTOMY Bilateral  12/13/2019   Procedure: LYMPHADENECTOMY;  Surgeon: [name], MD;  [facility];  Service: Urology;  Laterality: Bilateral;   ROBOT ASSISTED LAPAROSCOPIC COMPLETE CYSTECT ILEAL CONDUIT N/A 12/13/2019   Procedure: XI ROBOTIC ASSISTED LAPAROSCOPIC COMPLETE CYSTECT ILEAL CONDUIT AND RADICAL PROSTATECTOMY;  Surgeon: [name], MD;  [facility];  Service: Urology;  Laterality: N/A;  6 HRS   ROTATOR CUFF REPAIR Left 09/2013   left   SHOULDER INJECTION  10/2012   TEE WITHOUT CARDIOVERSION N/A 05/06/2021   Procedure: TRANSESOPHAGEAL ECHOCARDIOGRAM (TEE);  Surgeon: Lajuana Matte, MD;  Location: [facility];  Service: Open Heart Surgery;  Laterality: N/A;   TRANSTHORACIC ECHOCARDIOGRAM  11/2014   EF 50-55%, some wall motion abnormalities noted, grade I diast dysfxn   TRANSTHORACIC ECHOCARDIOGRAM  05/01/2021   pre CABG, EF 55-50%, nl wm, grd I DD, valves fine.   TRANSURETHRAL RESECTION OF BLADDER TUMOR N/A 06/12/2019   INVASIVE HIGH GRADE PAPILLARY UROTHELIAL CARCINOMA, invading muscularis propria.  Procedure: TRANSURETHRAL RESECTION OF BLADDER TUMOR (TURBT);  Surgeon: Lucas Mallow, MD;  [facility];  Service: Urology;  Laterality: N/A;   Family History  Problem Relation Age of Onset   Lung cancer Father    Heart disease Father    Breast cancer Mother    Congestive Heart Failure Mother    Diabetes Mother    Breast cancer Sister    Heart disease Brother    COPD Brother    Bladder Cancer Maternal Grandfather    Colon cancer Neg Hx    Esophageal cancer Neg Hx    Pancreatic cancer Neg Hx    Rectal cancer Neg Hx    Stomach cancer Neg Hx    Prostate cancer Neg Hx    Colon polyps Neg Hx    Social History   Socioeconomic History   Marital status: Married    Spouse name: Vaughan Basta   Number of children: 3   Years of education: Not on file   Highest education level: Not on file  Occupational History   Occupation: retired  Tobacco Use   Smoking status: Former     Packs/day: 2.00  Years: 35.00    Total pack years: 70.00    Types: Cigarettes    Quit date: 09/28/1998    Years since quitting: 23.5   Smokeless tobacco: Former    Types: Snuff    Quit date: 02/09/1986   Tobacco comments:    as a teenager  Vaping Use   Vaping Use: Never used  Substance and Sexual Activity   Alcohol use: No    Alcohol/week: 0.0 standard drinks of alcohol    Comment: quit 1994   Drug use: No   Sexual activity: Not Currently  Other Topics Concern   Not on file  Social History Narrative   Married, 3 children.   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   Orig from [place], lived [place] same farm all his life.   Former smoker: quit approx around [place].  80 pack-yr hx.   Alcohol-none, used to drink a lot of beer on weekends.  No drugs.   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   Diet: fair   Social Determinants of Health   Financial Resource Strain: Low Risk  (03/25/2022)   Overall Financial Resource Strain (CARDIA)    Difficulty of Paying Living Expenses: Not hard at all  Food Insecurity: No Food Insecurity (03/25/2022)   Hunger Vital Sign    Worried About Running Out of Food in the Last Year: Never true    Ran Out of Food in the Last Year: Never true  Transportation Needs: No Transportation Needs (03/25/2022)   PRAPARE - Hydrologist (Medical): No    Lack of Transportation (Non-Medical): No  Physical Activity: Insufficiently Active (03/25/2022)   Exercise Vital Sign    Days of Exercise per Week: 3 days    Minutes of Exercise per Session: 30 min  Stress: No Stress Concern Present (03/25/2022)   [place]    Feeling of Stress : Not at all  Social Connections: Moderately Integrated (03/25/2022)   Social Connection and Isolation Panel [NHANES]    Frequency of Communication with Friends and Family: More than three times a week    Frequency of  Social Gatherings with Friends and Family: More than three times a week    Attends Religious Services: More than 4 times per year    Active Member of [employer] or Organizations: No    Attends Music therapist: Never    Marital Status: Married    Tobacco Counseling Counseling given: Not Answered Tobacco comments: as a teenager   Clinical Intake:  Pre-visit preparation completed: Yes  Pain : No/denies pain     BMI - recorded: 26.05 Nutritional Status: BMI 25 -29 Overweight Nutritional Risks: None Diabetes: No  How often do you need to have someone help you when you read instructions, pamphlets, or other written materials from your doctor or pharmacy?: 1 - Never  Diabetic?no  Interpreter Needed?: No  Information entered by :: Charlott Rakes, LPN   Activities of Daily Living    03/25/2022    1:47 PM 05/01/2021   11:00 AM  In your present state of health, do you have any difficulty performing the following activities:  Hearing? 1 0  Comment hearing aids   Vision? 0 0  Difficulty concentrating or making decisions? 0 0  Walking or climbing stairs? 0 0  Dressing or bathing? 0 0  Doing errands, shopping? 0 0  Preparing Food and eating ? N  Using the Toilet? N   In the past six months, have you accidently leaked urine? N   Comment urostomy bag   Do you have problems with loss of bowel control? N   Managing your Medications? N   Managing your Finances? N   Housekeeping or managing your Housekeeping? N     Patient Care Team: Tammi Sou, MD as PCP - General (Family Medicine) Sueanne Margarita, MD as PCP - Cardiology (Cardiology) Jovita Gamma, MD as Consulting Physician (Neurosurgery) Mcarthur Rossetti, MD as Consulting Physician (Orthopedic Surgery) Lavonna Monarch, MD as Consulting Physician (Dermatology) Sueanne Margarita, MD as Consulting Physician (Cardiology) Gardiner Barefoot, DPM as Consulting Physician (Podiatry) Armbruster, Carlota Raspberry, MD as  Consulting Physician (Gastroenterology) Melida Quitter, MD as Consulting Physician (Otolaryngology) Harold Hedge, Darrick Grinder, MD as Consulting Physician (Allergy and Immunology) Lucas Mallow, MD as Consulting Physician (Urology) Wyatt Portela, MD as Consulting Physician (Oncology) [name], MD as Consulting Physician (Urology) [name], MD as Consulting Physician (Urology)  Indicate any recent Medical Services you may have received from other than [facility] providers in the past year (date may be approximate).     Assessment:   This is a routine wellness examination for Nethaniel.  Hearing/Vision screen Hearing Screening - Comments:: Pt has hearing aids Vision Screening - Comments:: Pt follows up with dr Katy Fitch for annual eye exams   Dietary issues and exercise activities discussed: Current Exercise Habits: Home exercise routine, Type of exercise: Other - see comments (work around the house), Time (Minutes): 30, Frequency (Times/Week): 3, Weekly Exercise (Minutes/Week): 90   Goals Addressed             This Visit's Progress    Patient Stated       Stay with a healthy BMI       Depression Screen    03/25/2022    1:44 PM 02/13/2022   10:05 AM 08/11/2021   10:24 AM 02/05/2021    2:09 PM 07/05/2020    9:52 AM 01/03/2020    8:43 AM 10/14/2018    9:55 AM  PHQ 2/9 Scores  PHQ - 2 Score 0 0 0 0 0 0 0    Fall Risk    03/25/2022    1:47 PM 02/13/2022   10:06 AM 08/11/2021   10:24 AM 02/05/2021    2:07 PM 07/05/2020    9:52 AM  Fall Risk   Falls in the past year? 0 0 0 0 0  Number falls in past yr: 0 0 0 0 0  Injury with Fall? 0 0 0 0 0  Risk for fall due to : Impaired vision No Fall Risks     Follow up Falls prevention discussed Falls evaluation completed Falls evaluation completed Falls prevention discussed     FALL RISK PREVENTION PERTAINING TO THE HOME:  Any stairs in or around the home? No  If so, are there any without handrails? No  Home free of loose throw  rugs in walkways, pet beds, electrical cords, etc? Yes  Adequate lighting in your home to reduce risk of falls? Yes   ASSISTIVE DEVICES UTILIZED TO PREVENT FALLS:  Life alert? No  Use of a cane, walker or w/c? No  Grab bars in the bathroom? No  Shower chair or bench in shower? No  Elevated toilet seat or a handicapped toilet? No   TIMED UP AND GO:  Was the test performed? No .   Cognitive Function:    10/22/2017  8:25 AM  MMSE - Mini Mental State Exam  Orientation to time 5  Orientation to Place 5  Registration 3  Attention/ Calculation 3  Recall 3  Language- name 2 objects 2  Language- repeat 1  Language- follow 3 step command 3  Language- read & follow direction 1  Write a sentence 1  Copy design 1  Total score 28        03/25/2022    1:51 PM 02/05/2021    2:14 PM  6CIT Screen  What Year? 0 points 0 points  What month? 0 points 0 points  What time? 0 points 0 points  Count back from 20 0 points 0 points  Months in reverse 0 points 0 points  Repeat phrase 0 points 0 points  Total Score 0 points 0 points    Immunizations Immunization History  Administered Date(s) Administered   Fluad Quad(high Dose 65+) 08/11/2021   Influenza Split 07/08/2011, 05/23/2012, 05/29/2013   Influenza Whole 07/19/2009   Influenza, High Dose Seasonal PF 05/24/2018, 12/19/2018, 05/18/2019, 02/05/2020, 02/04/2021   Influenza-Unspecified 07/04/2014, 06/18/2015, 06/16/2016, 06/04/2017, 05/07/2020   Moderna Sars-Covid-2 Vaccination 10/24/2019, 11/24/2019, 08/05/2020   Pneumococcal Conjugate-13 03/22/2014   Pneumococcal Polysaccharide-23 07/19/2009, 12/19/2018, 02/05/2020, 02/04/2021   Td 02/20/2010   Tdap 08/13/2021   Zoster Recombinat (Shingrix) 10/21/2017, 02/04/2018   Zoster, Live 09/28/2009    TDAP status: Up to date  Flu Vaccine status: Up to date  Pneumococcal vaccine status: Up to date  Covid-19 vaccine status: Completed vaccines  Qualifies for Shingles Vaccine? Yes    Zostavax completed Yes   Shingrix Completed?: Yes  Screening Tests Health Maintenance  Topic Date Due   COVID-19 Vaccine (4 - Booster for Moderna series) 09/30/2020   INFLUENZA VACCINE  04/28/2022   COLONOSCOPY (Pts 45-3yr Insurance coverage will need to be confirmed)  05/28/2023   TETANUS/TDAP  08/14/2031   Pneumonia Vaccine 81 Years old  Completed   Hepatitis C Screening  Completed   Zoster Vaccines- Shingrix  Completed   HPV VACCINES  Aged Out    Health Maintenance  Health Maintenance Due  Topic Date Due   COVID-19 Vaccine (4 - Booster for Moderna series) 09/30/2020    Colorectal cancer screening: Type of screening: Colonoscopy. Completed 05/27/20. Repeat every 3 years   Additional Screening:  Hepatitis C Screening:  Completed 01/28/21  Vision Screening: Recommended annual ophthalmology exams for early detection of glaucoma and other disorders of the eye. Is the patient up to date with their annual eye exam?  Yes  Who is the provider or what is the name of the office in which the patient attends annual eye exams? Dr GKaty Fitch If pt is not established with a provider, would they like to be referred to a provider to establish care? No .   Dental Screening: Recommended annual dental exams for proper oral hygiene  Community Resource Referral / Chronic Care Management: CRR required this visit?  No   CCM required this visit?  No      Plan:     I have personally reviewed and noted the following in the patient's chart:   Medical and social history Use of alcohol, tobacco or illicit drugs  Current medications and supplements including opioid prescriptions. Patient is currently taking opioid prescriptions. Information provided to patient regarding non-opioid alternatives. Patient advised to discuss non-opioid treatment plan with their provider. Functional ability and status Nutritional status Physical activity Advanced directives List of other  physicians Hospitalizations, surgeries, and ER visits in previous  12 months Vitals Screenings to include cognitive, depression, and falls Referrals and appointments  In addition, I have reviewed and discussed with patient certain preventive protocols, quality metrics, and best practice recommendations. A written personalized care plan for preventive services as well as general preventive health recommendations were provided to patient.     Willette Brace, LPN   7/92/1783   Nurse Notes: none

## 2022-04-03 ENCOUNTER — Other Ambulatory Visit: Payer: Self-pay | Admitting: Cardiology

## 2022-04-09 ENCOUNTER — Ambulatory Visit: Payer: Medicare HMO | Admitting: Dermatology

## 2022-04-09 DIAGNOSIS — C4441 Basal cell carcinoma of skin of scalp and neck: Secondary | ICD-10-CM

## 2022-04-09 DIAGNOSIS — L570 Actinic keratosis: Secondary | ICD-10-CM | POA: Diagnosis not present

## 2022-04-09 DIAGNOSIS — Z1283 Encounter for screening for malignant neoplasm of skin: Secondary | ICD-10-CM

## 2022-04-09 DIAGNOSIS — Z8589 Personal history of malignant neoplasm of other organs and systems: Secondary | ICD-10-CM

## 2022-04-09 DIAGNOSIS — Z85828 Personal history of other malignant neoplasm of skin: Secondary | ICD-10-CM

## 2022-04-09 DIAGNOSIS — C44519 Basal cell carcinoma of skin of other part of trunk: Secondary | ICD-10-CM | POA: Diagnosis not present

## 2022-04-09 DIAGNOSIS — D485 Neoplasm of uncertain behavior of skin: Secondary | ICD-10-CM

## 2022-04-09 NOTE — Patient Instructions (Signed)

## 2022-04-10 DIAGNOSIS — Z936 Other artificial openings of urinary tract status: Secondary | ICD-10-CM | POA: Diagnosis not present

## 2022-04-10 DIAGNOSIS — Z8551 Personal history of malignant neoplasm of bladder: Secondary | ICD-10-CM | POA: Diagnosis not present

## 2022-05-03 ENCOUNTER — Encounter: Payer: Self-pay | Admitting: Dermatology

## 2022-05-03 NOTE — Progress Notes (Signed)
   Follow-Up Visit   Subjective  Brandon Haynes is a 81 y.o. male who presents for the following: Annual Exam (Here for annual skin exam. Concerns left ear but patient said it is now healing. History of non mole skin cancer. ).  Annual skin check, several areas of concern Location:  Duration:  Quality:  Associated Signs/Symptoms: Modifying Factors:  Severity:  Timing: Context:   Objective  Well appearing patient in no apparent distress; mood and affect are within normal limits. Full body skin exam.  No atypical pigmented lesions (all checked with dermoscopy), no recurrent nonmelanoma skin cancer.  1 probable new nonmelanoma skin cancer right lower neck, will be biopsied and treated.  No recurrent carcinoma  Head - Anterior (Face), Left Hand - Posterior (3), Right Ear Gritty 2 to 4 mm pink crusts  Neck - Anterior 1.1 cm waxy nonulcerated plaque         A full examination was performed including scalp, head, eyes, ears, nose, lips, neck, chest, axillae, abdomen, back, buttocks, bilateral upper extremities, bilateral lower extremities, hands, feet, fingers, toes, fingernails, and toenails. All findings within normal limits unless otherwise noted below.  Areas beneath undergarment not fully examined   Assessment & Plan    Screening for malignant neoplasm of skin  Yearly skin exams.  History of squamous cell carcinoma  Check as needed change  AK (actinic keratosis) (5) Head - Anterior (Face); Right Ear; Left Hand - Posterior (3)  Destruction of lesion - Head - Anterior (Face), Left Hand - Posterior, Right Ear Complexity: simple   Destruction method: cryotherapy   Informed consent: discussed and consent obtained   Timeout:  patient name, date of birth, surgical site, and procedure verified Lesion destroyed using liquid nitrogen: Yes   Cryotherapy cycles:  5 Outcome: patient tolerated procedure well with no complications    Basal cell carcinoma of skin of scalp  and neck Neck - Anterior  Skin / nail biopsy Type of biopsy: tangential   Informed consent: discussed and consent obtained   Timeout: patient name, date of birth, surgical site, and procedure verified   Anesthesia: the lesion was anesthetized in a standard fashion   Anesthetic:  1% lidocaine w/ epinephrine 1-100,000 local infiltration Instrument used: flexible razor blade   Hemostasis achieved with: ferric subsulfate   Outcome: patient tolerated procedure well   Post-procedure details: wound care instructions given    Destruction of lesion Complexity: simple   Destruction method: electrodesiccation and curettage   Informed consent: discussed and consent obtained   Timeout:  patient name, date of birth, surgical site, and procedure verified Anesthesia: the lesion was anesthetized in a standard fashion   Anesthetic:  1% lidocaine w/ epinephrine 1-100,000 local infiltration Curettage performed in three different directions: Yes   Curettage cycles:  3 Lesion length (cm):  1.1 Lesion width (cm):  1.1 Margin per side (cm):  0 Final wound size (cm):  1.1 Hemostasis achieved with:  aluminum chloride Outcome: patient tolerated procedure well with no complications   Post-procedure details: wound care instructions given    Specimen 1 - Surgical pathology Differential Diagnosis: scc vs bcc txpbx Check Margins: No  After shave biopsy the base was treated with curettage plus cautery.      I, Lavonna Monarch, MD, have reviewed all documentation for this visit.  The documentation on 05/03/22 for the exam, diagnosis, procedures, and orders are all accurate and complete.

## 2022-05-06 ENCOUNTER — Other Ambulatory Visit: Payer: Self-pay | Admitting: Cardiology

## 2022-05-06 DIAGNOSIS — I25118 Atherosclerotic heart disease of native coronary artery with other forms of angina pectoris: Secondary | ICD-10-CM

## 2022-05-07 ENCOUNTER — Telehealth: Payer: Self-pay | Admitting: Dermatology

## 2022-05-07 ENCOUNTER — Telehealth: Payer: Self-pay | Admitting: Oncology

## 2022-05-07 MED ORDER — ROSUVASTATIN CALCIUM 40 MG PO TABS: 40.0000 mg | ORAL_TABLET | Freq: Every day | ORAL | 1 refills | Status: DC

## 2022-05-07 NOTE — Telephone Encounter (Signed)
Pathology to patient.  °

## 2022-05-07 NOTE — Telephone Encounter (Signed)
Patient is calling for pathology results from last visit with Stuart Tafeen, MD 

## 2022-05-07 NOTE — Telephone Encounter (Signed)
Called patient regarding upcoming 2024 appointment, patient has been called and notified.  

## 2022-06-10 DIAGNOSIS — Z8551 Personal history of malignant neoplasm of bladder: Secondary | ICD-10-CM | POA: Diagnosis not present

## 2022-06-10 DIAGNOSIS — Z936 Other artificial openings of urinary tract status: Secondary | ICD-10-CM | POA: Diagnosis not present

## 2022-06-15 ENCOUNTER — Ambulatory Visit: Payer: Medicare HMO | Admitting: Dermatology

## 2022-06-17 ENCOUNTER — Telehealth: Payer: Self-pay | Admitting: Cardiology

## 2022-06-17 ENCOUNTER — Other Ambulatory Visit: Payer: Self-pay

## 2022-06-17 DIAGNOSIS — I25118 Atherosclerotic heart disease of native coronary artery with other forms of angina pectoris: Secondary | ICD-10-CM

## 2022-06-17 MED ORDER — CLOPIDOGREL BISULFATE 75 MG PO TABS: 75.0000 mg | ORAL_TABLET | Freq: Every day | ORAL | 1 refills | Status: DC

## 2022-06-17 NOTE — Telephone Encounter (Signed)
*  STAT* If patient is at the pharmacy, call can be transferred to refill team.   1. Which medications need to be refilled? (please list name of each medication and dose if known) clopidogrel (PLAVIX) 75 MG tablet  2. Which pharmacy/location (including street and city if local pharmacy) is medication to be sent to? [id number] [place], [place] - [address]  3. Do they need a 30 day or 90 day supply? [place]

## 2022-07-06 ENCOUNTER — Other Ambulatory Visit: Payer: Self-pay | Admitting: Family Medicine

## 2022-07-08 DIAGNOSIS — H35373 Puckering of macula, bilateral: Secondary | ICD-10-CM | POA: Diagnosis not present

## 2022-07-08 DIAGNOSIS — Z961 Presence of intraocular lens: Secondary | ICD-10-CM | POA: Diagnosis not present

## 2022-07-08 DIAGNOSIS — H43812 Vitreous degeneration, left eye: Secondary | ICD-10-CM | POA: Diagnosis not present

## 2022-08-06 ENCOUNTER — Other Ambulatory Visit: Payer: Self-pay | Admitting: Family Medicine

## 2022-08-06 DIAGNOSIS — Z936 Other artificial openings of urinary tract status: Secondary | ICD-10-CM | POA: Diagnosis not present

## 2022-08-06 DIAGNOSIS — Z8551 Personal history of malignant neoplasm of bladder: Secondary | ICD-10-CM | POA: Diagnosis not present

## 2022-08-18 ENCOUNTER — Encounter: Payer: Self-pay | Admitting: Family Medicine

## 2022-08-18 ENCOUNTER — Ambulatory Visit: Payer: Medicare HMO | Admitting: Family Medicine

## 2022-08-18 VITALS — BP 108/60 | HR 55 | Temp 98.0°F | Ht 75.5 in | Wt 213.4 lb

## 2022-08-18 DIAGNOSIS — G894 Chronic pain syndrome: Secondary | ICD-10-CM

## 2022-08-18 DIAGNOSIS — E7800 Pure hypercholesterolemia, unspecified: Secondary | ICD-10-CM

## 2022-08-18 DIAGNOSIS — M5442 Lumbago with sciatica, left side: Secondary | ICD-10-CM

## 2022-08-18 DIAGNOSIS — Z79899 Other long term (current) drug therapy: Secondary | ICD-10-CM | POA: Diagnosis not present

## 2022-08-18 DIAGNOSIS — M5441 Lumbago with sciatica, right side: Secondary | ICD-10-CM | POA: Diagnosis not present

## 2022-08-18 DIAGNOSIS — G8929 Other chronic pain: Secondary | ICD-10-CM | POA: Diagnosis not present

## 2022-08-18 DIAGNOSIS — I10 Essential (primary) hypertension: Secondary | ICD-10-CM

## 2022-08-18 MED ORDER — TRAMADOL HCL 50 MG PO TABS: ORAL_TABLET | ORAL | 5 refills | Status: DC

## 2022-08-18 NOTE — Progress Notes (Signed)
OFFICE VISIT  08/18/2022  CC:  Chief Complaint  Patient presents with   Hypertension   Hyperlipidemia    Pt is fasting    Patient is a 81 y.o. male who presents for 67-monthfollow-up chronic pain syndrome, hypertension, and hyperlipidemia. A/P as of last visit: "1 hypertension, well controlled on 12.5 mg Lopressor twice daily.  2.  Hyperlipidemia.  Goal LDL less than 70.  Doing well on Crestor 40 mg a day. LDL was 49 at 5 months ago. Lipid panel and hepatic panel today.   3 chronic pain syndrome.  Lumbar degenerative disc disease/spondylosis with intermittent right-sided sciatica. Continue tramadol 50 mg 1 3 times daily with the Tylenol tab each time. We will restart gabapentin 300 mg, start with 1 a day and work gradually up to 1 3 times a day. He is currently in the process of establishing with a new orthopedic provider.   4. coronary artery disease, history of CABG about 9 months ago. Asymptomatic.  Continue aspirin, Plavix, statin, and beta-blocker as per cardiology recommendations.  5. Health maintenance exam: Reviewed age and gender appropriate health maintenance issues (prudent diet, regular exercise, health risks of tobacco and excessive alcohol, use of seatbelts, fire alarms in home, use of sunscreen).  Also reviewed age and gender appropriate health screening as well as vaccine recommendations. Vaccines: ALL UTD. Labs: CMET, FLP, CBC. Prostate ca screening: n/a d/t hx of prostatectomy. Colon ca screening: hx of polyps, most recently 05/27/20.  Recall 3 yrs."  INTERIM HX: Labs last visit were all normal.   Chronic pain: Lumbar degenerative disc disease with intermittent right-sided sciatica.  Has received epidural steroid injections in the past and these have been somewhat helpful.  I have prescribed him tramadol and small amounts long-term and he has done well with this. States pain is well controlled and his medication allows him to be functional. Takes 1 tab with  a tylenol tab 1-3 times a day.  Gabapentin 300 mg 3 times daily. PMP AWARE reviewed today: most recent rx for tramadol was filled 08/06/2022, #90, rx by me. Most recent gabapentin prescription filled 08/06/2022, #270, prescription by me. No red flags.   Past Medical History:  Diagnosis Date   Allergic rhinitis    allergy shots via    Anxiety    Bladder cancer ([place] 05/2019   pTisN0Mx.  Lesion resected. No mets.  He got neoadjuvant chemo x 4 cycles.  Cystoprostectomy with ileal conduit formation 12/13/19 (Dr. MTresa Moore. 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 difficile colitis 01/09/2020; late May 2021   oral vancomycin   CAD (coronary artery disease)    mid LAD stent 2016; CABG 04/2021   CAD, multiple vessel    a. CP/near-syncope 11/2014 s/p DES to MLAD, occluded RCA after takeoff of RV marginal with collaterals treated medically, otherwise mild nonobstructive disease. EF 55%;  b. 07/2015 Lexiscan CL: EF 56%, no ischemia/infarct.    Cataracts, both eyes    Chronic constipation    worse since ileal conduit surgery was done 2020   Chronic low back pain    DDD (degenerative disc disease), lumbar    GERD (gastroesophageal reflux disease)    PPI bid needed to control sx's   Gout    Hearing loss    History  of basal cell carcinoma    History of adenomatous polyp of colon    History of prostate cancer 11/2019   grd 2 cancer with neg margins incidental  on cystectomy path 11/2019.   Hypercholesteremia    Hypertension    Hx labile/uncontrolled.  Improved s/p visit to HTN clinic 2019.   Iron deficiency anemia 2017   Colonoscopy, EGD, and capsule endoscopy w/out obvious cause/source found.  Oral iron helping as of 03/2016.   Leg pain 03/06/2016   Nonmelanoma skin cancer    Osteoarthritis of multiple joints 10/18/2014   Peripheral vascular disease (HCC)    Pleural thickening    chronic   Renal neoplasm 10/2019   Small, left kidney: <2 cm,  90% endophytic by CT 04/2019, stable 10/2019. No avid enhancement or mass effect. Obs by urol as of 10/2019 and 11/2020.   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 Vertebral artery flow is   CATARACT EXTRACTION, BILATERAL     Jan, Feb 2022   COLONOSCOPY  08/2008; 02/2016; 02/2017; 05/27/20   2017: 12 polyps (adenomatous).  02/2017 repeat TCS--multiple polyps. 04/2020 +adenomas.   CORONARY ANGIOPLASTY WITH STENT PLACEMENT  12/04/2014   4.0 x 12 mm Synergy stent to the mid LAD   CORONARY ARTERY BYPASS GRAFT N/A 05/06/2021   Procedure: OFF PUMP CORONARY ARTERY BYPASS GRAFTING (CABG) X TWO, USING LEFT INTERNAL MAMMARY ARTERY AND RIGHT LEG GREATER SAPHENOUS VEIN HARVESTED ENDOSCOPICALLY;  Surgeon: Lajuana Matte, MD;  Location: [place];  Service: Open Heart Surgery;  Laterality: N/A;   CYSTOSCOPY  05/24/2019   CYSTOSCOPY W/ TRANSURETHRAL RESECTION OF POSTERIOR URETHERAL VALVES  06/12/2019   2-5cm   CYSTOSCOPY WITH INJECTION N/A 12/13/2019   Procedure: CYSTOSCOPY WITH INJECTION;  Surgeon: [name], MD;  [facility];  Service: Urology;  Laterality: N/A;   ESOPHAGOGASTRODUODENOSCOPY  02/2016   Gastritis.  NEG h pylori.     IR IMAGING GUIDED PORT INSERTION  09/12/2019   IR REMOVAL TUN ACCESS W/ PORT W/O FL MOD SED  08/20/2020   LE arterial vascular study  02/2016   Per Dr. Radford Pax, no evidence of peripheral vascular disease   LEFT HEART CATH AND CORONARY ANGIOGRAPHY N/A 05/01/2021   Procedure: LEFT HEART CATH AND CORONARY ANGIOGRAPHY;  Surgeon: Burnell Blanks, MD;  Location: [place] [facility];  Service: Cardiovascular;  Laterality: N/A;   LEFT HEART CATHETERIZATION WITH CORONARY  ANGIOGRAM N/A 12/04/2014   Procedure: LEFT HEART CATHETERIZATION WITH CORONARY ANGIOGRAM;  Surgeon: Jolaine Artist, MD; LAD 99% then aneurysmal, OM1 40%, CFX 50%, RCA 40%, 50% then occluded   LYMPHADENECTOMY Bilateral 12/13/2019   Procedure: LYMPHADENECTOMY;  Surgeon: [name], MD;  [facility];  Service: Urology;  Laterality: Bilateral;   ROBOT ASSISTED LAPAROSCOPIC COMPLETE CYSTECT ILEAL CONDUIT N/A 12/13/2019   Procedure: XI ROBOTIC ASSISTED LAPAROSCOPIC COMPLETE CYSTECT ILEAL CONDUIT AND RADICAL PROSTATECTOMY;  Surgeon: [name], MD;  Location: [facility];  Service: Urology;  Laterality: N/A;  6 HRS   ROTATOR CUFF REPAIR Left 09/2013   left   SHOULDER INJECTION  10/2012   TEE WITHOUT CARDIOVERSION N/A 05/06/2021   Procedure: TRANSESOPHAGEAL ECHOCARDIOGRAM (TEE);  Surgeon: Lajuana Matte, MD;  Location: [place];  Service: Open Heart Surgery;  Laterality: N/A;   TRANSTHORACIC ECHOCARDIOGRAM  11/2014   EF 50-55%, some wall motion abnormalities  noted, grade I diast dysfxn   TRANSTHORACIC ECHOCARDIOGRAM  05/01/2021   pre CABG, EF 55-50%, nl wm, grd I DD, valves fine.   TRANSURETHRAL RESECTION OF BLADDER TUMOR N/A 06/12/2019   INVASIVE HIGH GRADE PAPILLARY UROTHELIAL CARCINOMA, invading muscularis propria.  Procedure: TRANSURETHRAL RESECTION OF BLADDER TUMOR (TURBT);  Surgeon: Lucas Mallow, MD;  [facility];  Service: Urology;  Laterality: N/A;    Outpatient Medications Prior to Visit  Medication Sig Dispense Refill   acetaminophen (TYLENOL) 650 MG CR tablet Take 650 mg by mouth every 8 (eight) hours as needed for pain.     allopurinol (ZYLOPRIM) 300 MG tablet TAKE 1 TABLET BY MOUTH EVERY DAY 90 tablet 3   aspirin EC 81 MG tablet Take 81 mg by mouth daily.     Cholecalciferol (D3 PO) Take 1 capsule by mouth daily.     clopidogrel (PLAVIX) 75 MG tablet Take 1 tablet (75 mg total) by mouth daily. 90 tablet 1   desonide (DESOWEN) 0.05 % cream APPLY TO AFFECTED  AREA EVERY DAY 60 g 2   gabapentin (NEURONTIN) 300 MG capsule TAKE 1 CAPSULE BY MOUTH THREE TIMES A DAY 270 capsule 1   loratadine (CLARITIN) 10 MG tablet Take 20 mg by mouth daily.     metoprolol tartrate (LOPRESSOR) 25 MG tablet Take 0.5 tablets (12.5 mg total) by mouth 2 (two) times daily. 90 tablet 3   pantoprazole (PROTONIX) 40 MG tablet Take 1 tablet (40 mg total) by mouth daily. 90 tablet 2   PREVIDENT 5000 BOOSTER PLUS 1.1 % PSTE Take 1 application by mouth as directed.  1   rosuvastatin (CRESTOR) 40 MG tablet Take 1 tablet (40 mg total) by mouth daily. 90 tablet 1   triamcinolone (NASACORT) 55 MCG/ACT AERO nasal inhaler Place 1 spray into the nose 2 (two) times daily as needed (allergies).     colchicine 0.6 MG tablet 2 tabs po x 1, then 2 hours later take 1 tab.  Starting the following day take 1 tab po bid until gout flare is resolved. (Patient not taking: Reported on 08/18/2022) 20 tablet 3   EPINEPHrine 0.3 mg/0.3 mL IJ SOAJ injection Inject 0.3 mg into the muscle as needed for anaphylaxis. (Patient not taking: Reported on 10/13/2021)     hydrocortisone cream 1 % Apply 1 application. topically 4 (four) times daily as needed for itching. (Patient not taking: Reported on 08/18/2022)     traMADol (ULTRAM) 50 MG tablet TAKE 1 TABLET BY MOUTH THREE TIMES A DAY AS NEEDED FOR PAIN 90 tablet 5   No facility-administered medications prior to visit.    No Known Allergies  ROS As per HPI  PE:    08/18/2022    9:50 AM 03/05/2022   10:27 AM 02/13/2022   10:01 AM  Vitals with BMI  Height 6' 3.5" 6' 3.5" 6' 3.25"  Weight 213 lbs 6 oz 211 lbs 3 oz 212 lbs  BMI 26.31 85.46 27.03  Systolic 500 938 182  Diastolic 60 79 70  Pulse 55 68 60   Physical Exam  Gen: Alert, well appearing.  Patient is oriented to person, place, time, and situation. AFFECT: pleasant, lucid thought and speech. CV: RRR, no m/r/g.   LUNGS: CTA bilat, nonlabored resps, good aeration in all lung fields. Back exam:  No significant tenderness to palpation in the lumbar or sacral spine. Supine straight leg raise to about 45 degrees elicits radiating pain down back of leg to the level of  foot, bilateral.  This resolves with lowering the leg. MOTOR EXAMINATION:   Lower Extremity: L2,3 (hip flexors) Left: 5/5. Right: 5/5 L3,4 (knee extensors) Left: 5/5. Right: 5/5 L2,3,4(Hip Adduction) Left: 5/5. Right: 5/5 L4,5 (ankle dorsiflexion/TA) Left: 5/5. Right: 5/5 L5 (Hip abduction) Left: 5/5. Right: 5/5 L5 (EHL) Left: 5/5. Right: 5/5 S1 (ankle plantarflexion): Left: 5/5. Right: 5/5 L5-S2 (Knee flexors) Left 5/5.  Right 5/5  LABS:  Last CBC Lab Results  Component Value Date   WBC 7.3 02/13/2022   HGB 13.0 (L) 02/13/2022   HCT 40.5 02/13/2022   MCV 89.6 02/13/2022   MCH 28.8 02/13/2022   RDW 15.0 02/13/2022   PLT 192 92/42/6834   Last metabolic panel Lab Results  Component Value Date   GLUCOSE 87 02/13/2022   NA 143 02/13/2022   K 4.5 02/13/2022   CL 107 02/13/2022   CO2 25 02/13/2022   BUN 19 02/13/2022   CREATININE 1.15 02/13/2022   GFRNONAA >60 05/09/2021   CALCIUM 9.2 02/13/2022   PHOS 4.0 02/21/2020   PROT 6.6 02/13/2022   ALBUMIN 4.4 08/11/2021   BILITOT 0.4 02/13/2022   ALKPHOS 53 08/11/2021   AST 15 02/13/2022   ALT 9 02/13/2022   ANIONGAP 8 05/09/2021   Last lipids Lab Results  Component Value Date   CHOL 119 02/13/2022   HDL 57 02/13/2022   LDLCALC 41 02/13/2022   TRIG 130 02/13/2022   CHOLHDL 2.1 02/13/2022   Last hemoglobin A1c Lab Results  Component Value Date   HGBA1C 5.7 (H) 05/06/2021   IMPRESSION AND PLAN:  1 hypertension, well controlled on 12.5 mg Lopressor twice daily.  2.  Hyperlipidemia.  Goal LDL less than 70.  Doing well on Crestor 40 mg a day. LDL was 41 six months ago. Lipid panel today.  #3 chronic pain syndrome.  Bilateral low back pain with intermittent bilateral sciatica. Well-controlled using tramadol 50 mg 1-3 times a day and gabapentin 300  mg 3 times daily. Continue this. Controlled substance contract updated today. Urine drug screen today.  An After Visit Summary was printed and given to the patient.  FOLLOW UP: Return in about 6 months (around 02/16/2023) for annual CPE (fasting).  Signed:  Crissie Sickles, MD           08/18/2022

## 2022-08-19 LAB — DRUG MONITORING PANEL 376104, URINE
Amphetamines: NEGATIVE ng/mL (ref <500)
Barbiturates: NEGATIVE ng/mL (ref <300)
Benzodiazepines: NEGATIVE ng/mL (ref <100)
Cocaine Metabolite: NEGATIVE ng/mL (ref <150)
Desmethyltramadol: 1300 ng/mL — ABNORMAL HIGH (ref <100)
Opiates: NEGATIVE ng/mL (ref <100)
Oxycodone: NEGATIVE ng/mL (ref <100)
Tramadol: 4072 ng/mL — ABNORMAL HIGH (ref <100)

## 2022-08-19 LAB — LIPID PANEL
Cholesterol: 144 mg/dL (ref <200)
HDL: 70 mg/dL (ref >=40)
LDL Cholesterol (Calc): 53 mg/dL (calc)
Non-HDL Cholesterol (Calc): 74 mg/dL (calc) (ref <130)
Total CHOL/HDL Ratio: 2.1 (calc) (ref <5.0)
Triglycerides: 124 mg/dL (ref <150)

## 2022-08-19 LAB — BASIC METABOLIC PANEL
BUN: 14 mg/dL (ref 7–25)
CO2: 26 mmol/L (ref 20–32)
Calcium: 9.7 mg/dL (ref 8.6–10.3)
Chloride: 105 mmol/L (ref 98–110)
Creat: 1.1 mg/dL (ref 0.70–1.28)
Glucose, Bld: 84 mg/dL (ref 65–99)
Potassium: 4.3 mmol/L (ref 3.5–5.3)
Sodium: 144 mmol/L (ref 135–146)

## 2022-08-19 LAB — DM TEMPLATE

## 2022-09-30 DIAGNOSIS — L578 Other skin changes due to chronic exposure to nonionizing radiation: Secondary | ICD-10-CM | POA: Diagnosis not present

## 2022-09-30 DIAGNOSIS — L570 Actinic keratosis: Secondary | ICD-10-CM | POA: Diagnosis not present

## 2022-09-30 DIAGNOSIS — L820 Inflamed seborrheic keratosis: Secondary | ICD-10-CM | POA: Diagnosis not present

## 2022-09-30 DIAGNOSIS — C44319 Basal cell carcinoma of skin of other parts of face: Secondary | ICD-10-CM | POA: Diagnosis not present

## 2022-10-07 ENCOUNTER — Ambulatory Visit: Payer: Medicare HMO | Attending: Cardiology | Admitting: Cardiology

## 2022-10-07 ENCOUNTER — Encounter: Payer: Self-pay | Admitting: Cardiology

## 2022-10-07 VITALS — BP 126/84 | HR 60 | Ht 74.0 in | Wt 213.8 lb

## 2022-10-07 DIAGNOSIS — I10 Essential (primary) hypertension: Secondary | ICD-10-CM

## 2022-10-07 DIAGNOSIS — I25118 Atherosclerotic heart disease of native coronary artery with other forms of angina pectoris: Secondary | ICD-10-CM | POA: Diagnosis not present

## 2022-10-07 DIAGNOSIS — E7800 Pure hypercholesterolemia, unspecified: Secondary | ICD-10-CM | POA: Diagnosis not present

## 2022-10-07 NOTE — Progress Notes (Unsigned)
Date:  10/07/2022   ID:  Brandon Haynes, DOB October 30, 1940, MRN [id number] The patient was identified using 2 identifiers.  PCP:  Tammi Sou, MD   [facility] Providers Cardiologist:  Fransico Him, MD     Evaluation Performed:  Follow-Up Visit  Chief Complaint:  CAD, HTN, HLD  History of Present Illness:    Brandon Haynes is a 82 y.o. 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 The RCA was felt well collateralized and he underwent PCI of the LAD.  LV function was 55% at time of cath.  Echocardiogram revealed akinesis of the basal-mid inferoseptal myocardium and  mid-apical anteroseptal myocardium with EF 50-55%.    He subsequently developed recurrent chest pain in August 2022 with cath showing heavily calcified LAD, patent mid LAD stent with minimal stenosis with unsuccessful PCI and underwent CABG x2 on 05/06/2021.  This was complicated by postop thrombocytopenia and hypotension requiring midodrine.  He was seen back in the office postop and was doing well with except for an occasional dizzy spell with changes in position.  He is here today for followup and is doing well.  He denies any chest pain or pressure, SOB, DOE, PND, orthopnea, LE edema, dizziness, palpitations or syncope. He is compliant with his meds and is tolerating meds with no SE.    Past Medical History:  Diagnosis Date   Allergic rhinitis    allergy shots via [place]   Anxiety    Bladder cancer ([place]) 05/2019   pTisN0Mx.  Lesion resected. No mets.  He got neoadjuvant chemo x 4 cycles.  Cystoprostectomy with ileal conduit formation 12/13/19 (Dr. Tresa Moore). 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 difficile colitis 01/09/2020; late May 2021   oral vancomycin   CAD (coronary artery disease)    mid LAD stent 2016; CABG 04/2021   CAD, multiple vessel    a. CP/near-syncope 11/2014 s/p DES to MLAD, occluded RCA after takeoff of RV marginal with  collaterals treated medically, otherwise mild nonobstructive disease. EF 55%;  b. 07/2015 Lexiscan CL: EF 56%, no ischemia/infarct.    Cataracts, both eyes    Chronic constipation    worse since ileal conduit surgery was done 2020   Chronic low back pain    DDD (degenerative disc disease), lumbar    GERD (gastroesophageal reflux disease)    PPI bid needed to control sx's   Gout    Hearing loss    History  of basal cell carcinoma    History of adenomatous polyp of colon    History of prostate cancer 11/2019   grd 2 cancer with neg margins incidental on cystectomy path 11/2019.   Hypercholesteremia    Hypertension    Hx labile/uncontrolled.  Improved s/p visit to HTN clinic 2019.   Iron deficiency anemia 2017   Colonoscopy, EGD, and capsule endoscopy w/out obvious cause/source found.  Oral iron helping as of 03/2016.   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 No avid enhancement or mass effect. Obs by urol as of 10/2019 and 11/2020.   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 Vertebral artery flow is   CATARACT EXTRACTION, BILATERAL     Jan, Feb 2022   COLONOSCOPY  08/2008; 02/2016; 02/2017; 05/27/20   2017: 12 polyps (adenomatous).  02/2017 repeat TCS--multiple polyps. 04/2020 +adenomas.   CORONARY ANGIOPLASTY WITH STENT PLACEMENT  12/04/2014   4.0 x 12 mm Synergy stent to the mid LAD   CORONARY ARTERY BYPASS GRAFT N/A 05/06/2021   Procedure: OFF PUMP CORONARY ARTERY BYPASS  GRAFTING (CABG) X TWO, USING LEFT INTERNAL MAMMARY ARTERY AND RIGHT LEG GREATER SAPHENOUS VEIN HARVESTED ENDOSCOPICALLY;  Surgeon: Lajuana Matte, MD;  Location: [place];  Service: Open Heart Surgery;  Laterality: N/A;   CYSTOSCOPY  05/24/2019   CYSTOSCOPY W/ TRANSURETHRAL RESECTION OF POSTERIOR URETHERAL VALVES  06/12/2019   2-5cm   CYSTOSCOPY WITH INJECTION N/A 12/13/2019   Procedure: CYSTOSCOPY WITH INJECTION;  Surgeon: [name], MD;  [facility];  Service: Urology;  Laterality: N/A;   ESOPHAGOGASTRODUODENOSCOPY  02/2016   Gastritis.  NEG h pylori.     IR IMAGING GUIDED PORT INSERTION  09/12/2019   IR REMOVAL TUN ACCESS W/ PORT W/O FL MOD SED  08/20/2020   LE arterial vascular study  02/2016   Per Dr. Radford Pax, no evidence of peripheral vascular disease   LEFT HEART CATH AND CORONARY ANGIOGRAPHY N/A 05/01/2021   Procedure: LEFT HEART CATH AND CORONARY ANGIOGRAPHY;  Surgeon: Burnell Blanks, MD;  Location: [place] [facility];  Service: Cardiovascular;  Laterality: N/A;   LEFT HEART CATHETERIZATION WITH CORONARY ANGIOGRAM N/A 12/04/2014   Procedure: LEFT HEART CATHETERIZATION WITH CORONARY ANGIOGRAM;  Surgeon: Jolaine Artist, MD; LAD 99% then aneurysmal, OM1 40%, CFX 50%, RCA 40%, 50% then occluded   LYMPHADENECTOMY Bilateral 12/13/2019   Procedure: LYMPHADENECTOMY;  Surgeon: [name], MD;  [facility];  Service: Urology;  Laterality: Bilateral;   ROBOT ASSISTED LAPAROSCOPIC COMPLETE CYSTECT ILEAL CONDUIT N/A 12/13/2019   Procedure: XI ROBOTIC ASSISTED LAPAROSCOPIC COMPLETE CYSTECT ILEAL CONDUIT AND RADICAL PROSTATECTOMY;  Surgeon: [name], MD;  [facility];  Service: Urology;  Laterality: N/A;  6 HRS   ROTATOR CUFF REPAIR Left 09/2013   left   SHOULDER INJECTION  10/2012   TEE WITHOUT CARDIOVERSION N/A 05/06/2021   Procedure: TRANSESOPHAGEAL ECHOCARDIOGRAM (TEE);  Surgeon: Lajuana Matte, MD;  Location: [place];  Service: Open Heart  Surgery;  Laterality: N/A;   TRANSTHORACIC ECHOCARDIOGRAM  11/2014   EF 50-55%, some wall motion abnormalities noted, grade I diast dysfxn   TRANSTHORACIC ECHOCARDIOGRAM  05/01/2021   pre CABG, EF 55-50%, nl wm, grd I DD, valves fine.   TRANSURETHRAL RESECTION OF BLADDER TUMOR N/A 06/12/2019   INVASIVE HIGH GRADE PAPILLARY UROTHELIAL CARCINOMA, invading muscularis propria.  Procedure: TRANSURETHRAL RESECTION OF BLADDER TUMOR (TURBT);  Surgeon: Lucas Mallow, MD;  [facility];  Service: Urology;  Laterality: N/A;     Current Meds  Medication Sig   acetaminophen (TYLENOL) 650 MG CR tablet Take 650 mg by mouth every 8 (eight) hours as needed for pain.   allopurinol (ZYLOPRIM) 300 MG tablet TAKE 1 TABLET BY MOUTH EVERY DAY   aspirin EC 81 MG tablet Take 81 mg by mouth daily.   Cholecalciferol (D3 PO) Take  1 capsule by mouth daily.   clopidogrel (PLAVIX) 75 MG tablet Take 1 tablet (75 mg total) by mouth daily.   colchicine 0.6 MG tablet 2 tabs po x 1, then 2 hours later take 1 tab.  Starting the following day take 1 tab po bid until gout flare is resolved.   desonide (DESOWEN) 0.05 % cream APPLY TO AFFECTED AREA EVERY DAY   EPINEPHrine 0.3 mg/0.3 mL IJ SOAJ injection Inject 0.3 mg into the muscle as needed for anaphylaxis.   hydrocortisone cream 1 % Apply 1 application  topically 4 (four) times daily as needed for itching.   loratadine (CLARITIN) 10 MG tablet Take 20 mg by mouth daily.   metoprolol tartrate (LOPRESSOR) 25 MG tablet Take 0.5 tablets (12.5 mg total) by mouth 2 (two) times daily.   pantoprazole (PROTONIX) 40 MG tablet Take 1 tablet (40 mg total) by mouth daily.   PREVIDENT 5000 BOOSTER PLUS 1.1 % PSTE Take 1 application by mouth as directed.   rosuvastatin (CRESTOR) 40 MG tablet Take 1 tablet (40 mg total) by mouth daily.   traMADol (ULTRAM) 50 MG tablet TAKE 1 TABLET BY MOUTH THREE TIMES A DAY AS NEEDED FOR PAIN   triamcinolone (NASACORT) 55 MCG/ACT AERO nasal inhaler  Place 1 spray into the nose 2 (two) times daily as needed (allergies).   [DISCONTINUED] gabapentin (NEURONTIN) 300 MG capsule TAKE 1 CAPSULE BY MOUTH THREE TIMES A DAY     Allergies:   Patient has no known allergies.   Social History   Tobacco Use   Smoking status: Former    Packs/day: 2.00    Years: 35.00    Total pack years: 70.00    Types: Cigarettes    Quit date: 09/28/1998    Years since quitting: 24.0   Smokeless tobacco: Former    Types: Snuff    Quit date: 02/09/1986   Tobacco comments:    as a teenager  Vaping Use   Vaping Use: Never used  Substance Use Topics   Alcohol use: No    Alcohol/week: 0.0 standard drinks of alcohol    Comment: quit 1994   Drug use: No     Family Hx: The patient's family history includes Bladder Cancer in his maternal grandfather; Breast cancer in his mother and sister; COPD in his brother; Congestive Heart Failure in his mother; Diabetes in his mother; Heart disease in his brother and father; Lung cancer in his father. There is no history of Colon cancer, Esophageal cancer, Pancreatic cancer, Rectal cancer, Stomach cancer, Prostate cancer, or Colon polyps.  ROS:   Please see the history of present illness.     All other systems reviewed and are negative.   Prior CV studies:   The following studies were reviewed today:  Cardiac cath  Labs/Other Tests and Data Reviewed:    EKG:  EKG today demonstrates NSR with LAD, LAFB, LVH by voltage  Recent Labs: 02/13/2022: ALT 9; Hemoglobin 13.0; Platelets 192 08/18/2022: BUN 14; Creat 1.10; Potassium 4.3; Sodium 144   Recent Lipid Panel Lab Results  Component Value Date/Time   CHOL 144 08/18/2022 10:30 AM   CHOL 126 10/15/2021 09:53 AM   TRIG 124 08/18/2022 10:30 AM   HDL 70 08/18/2022 10:30 AM   HDL 54 10/15/2021 09:53 AM   CHOLHDL 2.1 08/18/2022 10:30 AM   LDLCALC 53 08/18/2022 10:30 AM    Wt Readings from Last 3 Encounters:  10/07/22 213 lb 12.8 oz (97 kg)  08/18/22 213 lb 6.4  oz  (96.8 kg)  03/05/22 211 lb 3.2 oz (95.8 kg)     Risk Assessment/Calculations:          Objective:    Vital Signs:  BP 126/84   Pulse 60   Ht '6\' 2"'$  (1.88 m)   Wt 213 lb 12.8 oz (97 kg)   SpO2 96%   BMI 27.45 kg/m   GEN: Well nourished, well developed in no acute distress HEENT: Normal NECK: No JVD; No carotid bruits LYMPHATICS: No lymphadenopathy CARDIAC:RRR, no murmurs, rubs, gallops RESPIRATORY:  Clear to auscultation without rales, wheezing or rhonchi  ABDOMEN: Soft, non-tender, non-distended MUSCULOSKELETAL:  No edema; No deformity  SKIN: Warm and dry NEUROLOGIC:  Alert and oriented x 3 PSYCHIATRIC:  Normal affect  ASSESSMENT & PLAN:    1.  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 The RCA was felt well collateralized and he underwent PCI of the LAD.   -He had recurrent chest pain with cath showing heavily calcified LAD, patent mid LAD stent with minimal stenosis with unsuccessful PCI and underwent CABG x2 on 05/06/2021. -He denies any anginal open since I saw him last -Continue prescription drug with aspirin 81 mg daily, Plavix 75 mg daily, metoprolol 12.5 mg twice daily and statin therapy with as needed refills  2.  Hypertension -Blood pressure is well-controlled on exam today -Continue prescription drug management with Lopressor 12.5 mg twice daily with as needed refills   3.  Hyperlipidemia  - his LDL goal is < 70.   -I have personally reviewed and interpreted outside labs performed by patient's PCP which showed LDL 53 and HDL 70 on 08/18/2022 -Continue prescription drug managed with Crestor 40 mg daily with as needed refills     Medication Adjustments/Labs and Tests Ordered: Current medicines are reviewed at length with the patient today.  Concerns regarding medicines are outlined above.   Tests Ordered: Orders Placed This Encounter  Procedures   EKG 12-Lead    Medication Changes: No orders of the defined types were  placed in this encounter.   Follow Up:  In Person in 1 year(s)  Signed, Fransico Him, MD  10/07/2022 3:38 PM    [place]

## 2022-10-07 NOTE — Patient Instructions (Signed)
Medication Instructions:  Your physician recommends that you continue on your current medications as directed. Please refer to the Current Medication list given to you today.  *If you need a refill on your cardiac medications before your next appointment, please call your pharmacy*  Lab Work: NONE  Testing/Procedures: NONE  Follow-Up: At [facility], you and your health needs are our priority.  As part of our continuing mission to provide you with exceptional heart care, we have created designated Provider Care Teams.  These Care Teams include your primary Cardiologist (physician) and Advanced Practice Providers (APPs -  Physician Assistants and Nurse Practitioners) who all work together to provide you with the care you need, when you need it.  Your next appointment:   1 year(s)  Provider:   Fransico Him, MD

## 2022-10-08 ENCOUNTER — Other Ambulatory Visit: Payer: Self-pay | Admitting: Family Medicine

## 2022-10-13 DIAGNOSIS — Z8551 Personal history of malignant neoplasm of bladder: Secondary | ICD-10-CM | POA: Diagnosis not present

## 2022-10-13 DIAGNOSIS — Z936 Other artificial openings of urinary tract status: Secondary | ICD-10-CM | POA: Diagnosis not present

## 2022-10-14 ENCOUNTER — Telehealth: Payer: Self-pay | Admitting: Oncology

## 2022-10-14 NOTE — Telephone Encounter (Signed)
Called patient per Dr. Alen Blew transition. Patient notified and appointments cancelled.

## 2022-10-20 DIAGNOSIS — C44319 Basal cell carcinoma of skin of other parts of face: Secondary | ICD-10-CM | POA: Diagnosis not present

## 2022-10-23 DIAGNOSIS — Z936 Other artificial openings of urinary tract status: Secondary | ICD-10-CM | POA: Diagnosis not present

## 2022-10-23 DIAGNOSIS — Z8551 Personal history of malignant neoplasm of bladder: Secondary | ICD-10-CM | POA: Diagnosis not present

## 2022-11-26 ENCOUNTER — Other Ambulatory Visit: Payer: Self-pay | Admitting: Cardiology

## 2022-11-26 ENCOUNTER — Other Ambulatory Visit: Payer: Self-pay | Admitting: Nurse Practitioner

## 2022-11-26 DIAGNOSIS — I25118 Atherosclerotic heart disease of native coronary artery with other forms of angina pectoris: Secondary | ICD-10-CM

## 2022-12-07 ENCOUNTER — Other Ambulatory Visit: Payer: Self-pay | Admitting: Cardiology

## 2022-12-07 DIAGNOSIS — I25118 Atherosclerotic heart disease of native coronary artery with other forms of angina pectoris: Secondary | ICD-10-CM

## 2022-12-12 DIAGNOSIS — Z936 Other artificial openings of urinary tract status: Secondary | ICD-10-CM | POA: Diagnosis not present

## 2022-12-12 DIAGNOSIS — Z8551 Personal history of malignant neoplasm of bladder: Secondary | ICD-10-CM | POA: Diagnosis not present

## 2022-12-22 DIAGNOSIS — D485 Neoplasm of uncertain behavior of skin: Secondary | ICD-10-CM | POA: Diagnosis not present

## 2023-01-06 DIAGNOSIS — L821 Other seborrheic keratosis: Secondary | ICD-10-CM | POA: Diagnosis not present

## 2023-01-06 DIAGNOSIS — L219 Seborrheic dermatitis, unspecified: Secondary | ICD-10-CM | POA: Diagnosis not present

## 2023-01-06 DIAGNOSIS — L570 Actinic keratosis: Secondary | ICD-10-CM | POA: Diagnosis not present

## 2023-01-08 ENCOUNTER — Other Ambulatory Visit: Payer: Self-pay | Admitting: Nurse Practitioner

## 2023-01-20 DIAGNOSIS — L821 Other seborrheic keratosis: Secondary | ICD-10-CM | POA: Diagnosis not present

## 2023-01-20 DIAGNOSIS — L219 Seborrheic dermatitis, unspecified: Secondary | ICD-10-CM | POA: Diagnosis not present

## 2023-02-11 DIAGNOSIS — Z8551 Personal history of malignant neoplasm of bladder: Secondary | ICD-10-CM | POA: Diagnosis not present

## 2023-02-11 DIAGNOSIS — Z936 Other artificial openings of urinary tract status: Secondary | ICD-10-CM | POA: Diagnosis not present

## 2023-02-16 ENCOUNTER — Encounter: Payer: Medicare HMO | Admitting: Family Medicine

## 2023-02-23 ENCOUNTER — Other Ambulatory Visit: Payer: Self-pay | Admitting: Family Medicine

## 2023-02-23 DIAGNOSIS — C61 Malignant neoplasm of prostate: Secondary | ICD-10-CM | POA: Diagnosis not present

## 2023-02-23 DIAGNOSIS — C672 Malignant neoplasm of lateral wall of bladder: Secondary | ICD-10-CM | POA: Diagnosis not present

## 2023-02-23 NOTE — Telephone Encounter (Signed)
Requesting: tramadol Contract: 08/18/22 UDS: 08/18/22 Last Visit: 08/18/22 Next Visit: 03/04/23 Last Refill: 08/18/22 (90,5)  Please Advise. Med pending

## 2023-03-01 DIAGNOSIS — R8271 Bacteriuria: Secondary | ICD-10-CM | POA: Diagnosis not present

## 2023-03-01 DIAGNOSIS — C672 Malignant neoplasm of lateral wall of bladder: Secondary | ICD-10-CM | POA: Diagnosis not present

## 2023-03-01 NOTE — Patient Instructions (Signed)

## 2023-03-03 ENCOUNTER — Ambulatory Visit: Payer: Medicare HMO | Admitting: Oncology

## 2023-03-04 ENCOUNTER — Encounter: Payer: Self-pay | Admitting: Family Medicine

## 2023-03-04 ENCOUNTER — Ambulatory Visit (INDEPENDENT_AMBULATORY_CARE_PROVIDER_SITE_OTHER): Payer: Medicare HMO | Admitting: Family Medicine

## 2023-03-04 VITALS — BP 130/75 | HR 53 | Temp 97.9°F | Ht 75.0 in | Wt 210.2 lb

## 2023-03-04 DIAGNOSIS — Z0000 Encounter for general adult medical examination without abnormal findings: Secondary | ICD-10-CM

## 2023-03-04 DIAGNOSIS — I10 Essential (primary) hypertension: Secondary | ICD-10-CM

## 2023-03-04 DIAGNOSIS — G894 Chronic pain syndrome: Secondary | ICD-10-CM | POA: Diagnosis not present

## 2023-03-04 DIAGNOSIS — R7989 Other specified abnormal findings of blood chemistry: Secondary | ICD-10-CM

## 2023-03-04 DIAGNOSIS — E7800 Pure hypercholesterolemia, unspecified: Secondary | ICD-10-CM

## 2023-03-04 DIAGNOSIS — Z79899 Other long term (current) drug therapy: Secondary | ICD-10-CM

## 2023-03-04 DIAGNOSIS — M545 Low back pain, unspecified: Secondary | ICD-10-CM | POA: Diagnosis not present

## 2023-03-04 DIAGNOSIS — G8929 Other chronic pain: Secondary | ICD-10-CM

## 2023-03-04 NOTE — Progress Notes (Signed)
Office Note 03/04/2023  CC:  Chief Complaint  Patient presents with   Annual Exam    Pt is fasting   Patient is a 82 y.o. male who is here for annual health maintenance exam and 61-month follow-up hypertension, hyperlipidemia, and chronic pain syndrome. A/P as of last visit: "1 hypertension, well controlled on 12.5 mg Lopressor twice daily.  2.  Hyperlipidemia.  Goal LDL less than 70.  Doing well on Crestor 40 mg a day. LDL was 41 six months ago. Lipid panel today.   #3 chronic pain syndrome.  Bilateral low back pain with intermittent bilateral sciatica. Well-controlled using tramadol 50 mg 1-3 times a day and gabapentin 300 mg 3 times daily. Continue this. Controlled substance contract updated today. Urine drug screen today."  INTERIM HX: Brandon Haynes is doing very well. He works in his yard every day 2 to 3 hours.    No acute concerns.  Chronic pain: Lumbar degenerative disc disease with intermittent right-sided sciatica.  Has received epidural steroid injections in the past and these have been somewhat helpful.  I have prescribed him tramadol and small amounts long-term and he has done well with this. States pain is well controlled and his medication allows him to be functional. PMP AWARE reviewed today: most recent rx for tramadol was filled 02/23/2023, # 90, rx by me. No red flags.  Past Medical History:  Diagnosis Date   Allergic rhinitis    allergy shots via [place]   Anxiety    Bladder cancer (HCC) 05/2019   [id number].  Lesion resected. No mets.  He got neoadjuvant chemo x 4 cycles.  Cystoprostectomy with ileal conduit formation 12/13/19 (Dr. Berneice Heinrich). 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 difficile colitis 01/09/2020; late May 2021   oral vancomycin   CAD (coronary artery disease)    mid LAD stent 2016; CABG 04/2021   CAD, multiple vessel    a. CP/near-syncope 11/2014 s/p DES to MLAD, occluded RCA after takeoff of RV  marginal with collaterals treated medically, otherwise mild nonobstructive disease. EF 55%;  b. 07/2015 Lexiscan CL: EF 56%, no ischemia/infarct.    Cataracts, both eyes    Chronic constipation    worse since ileal conduit surgery was done 2020   Chronic low back pain    DDD (degenerative disc disease), lumbar    GERD (gastroesophageal reflux disease)    PPI bid needed to control sx's   Gout    Hearing loss    History  of basal cell carcinoma    History of adenomatous polyp of colon    History of prostate cancer 11/2019   grd 2 cancer with neg margins incidental on cystectomy path 11/2019.   Hypercholesteremia    Hypertension    Hx labile/uncontrolled.  Improved s/p visit to HTN clinic 2019.   Iron deficiency anemia 2017   Colonoscopy, EGD, and capsule endoscopy w/out obvious cause/source found.  Oral iron helping as of 03/2016.   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 No avid enhancement or mass effect. Obs by urol as of 10/2019 and 11/2020.   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 Vertebral artery flow is   CATARACT EXTRACTION, BILATERAL     Jan, Feb 2022   COLONOSCOPY  08/2008; 02/2016; 02/2017; 05/27/20   2017: 12 polyps (adenomatous).  02/2017 repeat TCS--multiple polyps. 04/2020 +adenomas.   CORONARY ANGIOPLASTY WITH STENT PLACEMENT  12/04/2014   4.0 x 12 mm Synergy stent to the mid LAD   CORONARY ARTERY BYPASS GRAFT N/A 05/06/2021   Procedure: OFF PUMP  CORONARY ARTERY BYPASS GRAFTING (CABG) X TWO, USING LEFT INTERNAL MAMMARY ARTERY AND RIGHT LEG GREATER SAPHENOUS VEIN HARVESTED ENDOSCOPICALLY;  Surgeon: Corliss Skains, MD;  Location: [facility] OR;  Service: Open Heart Surgery;  Laterality: N/A;   CYSTOSCOPY  05/24/2019   CYSTOSCOPY W/ TRANSURETHRAL RESECTION OF POSTERIOR URETHERAL VALVES  06/12/2019   2-5cm   CYSTOSCOPY WITH INJECTION N/A 12/13/2019   Procedure: CYSTOSCOPY WITH INJECTION;  Surgeon: Sebastian Ache, MD;  Location: [facility] ORS;  Service: Urology;  Laterality: N/A;   ESOPHAGOGASTRODUODENOSCOPY  02/2016   Gastritis.  NEG h pylori.     IR IMAGING GUIDED PORT INSERTION  09/12/2019   IR REMOVAL TUN ACCESS W/ PORT W/O FL MOD SED  08/20/2020   LE arterial vascular study  02/2016   Per Dr. Mayford Knife, no evidence of peripheral vascular disease   LEFT HEART CATH AND CORONARY ANGIOGRAPHY N/A 05/01/2021   Procedure: LEFT HEART CATH AND CORONARY ANGIOGRAPHY;  Surgeon: Kathleene Hazel, MD;  Location: [facility] INVASIVE CV LAB;  Service: Cardiovascular;  Laterality: N/A;   LEFT HEART CATHETERIZATION WITH CORONARY ANGIOGRAM N/A 12/04/2014   Procedure: LEFT HEART CATHETERIZATION WITH CORONARY ANGIOGRAM;  Surgeon: Dolores Patty, MD; LAD 99% then aneurysmal, OM1 40%, CFX 50%, RCA 40%, 50% then occluded   LYMPHADENECTOMY Bilateral 12/13/2019   Procedure: LYMPHADENECTOMY;  Surgeon: Sebastian Ache, MD;  Location: [facility];  Service: Urology;  Laterality: Bilateral;   ROBOT ASSISTED LAPAROSCOPIC COMPLETE CYSTECT ILEAL CONDUIT N/A 12/13/2019   Procedure: XI ROBOTIC ASSISTED LAPAROSCOPIC COMPLETE CYSTECT ILEAL CONDUIT AND RADICAL PROSTATECTOMY;  Surgeon: Sebastian Ache, MD;  Location: [facility];  Service: Urology;  Laterality: N/A;  6 HRS   ROTATOR CUFF REPAIR Left 09/2013   left   SHOULDER INJECTION  10/2012   TEE WITHOUT CARDIOVERSION N/A 05/06/2021   Procedure: TRANSESOPHAGEAL ECHOCARDIOGRAM (TEE);  Surgeon: Corliss Skains, MD;  Location: [facility] OR;   Service: Open Heart Surgery;  Laterality: N/A;   TRANSTHORACIC ECHOCARDIOGRAM  11/2014   EF 50-55%, some wall motion abnormalities noted, grade I diast dysfxn   TRANSTHORACIC ECHOCARDIOGRAM  05/01/2021   pre CABG, EF 55-50%, nl wm, grd I DD, valves fine.   TRANSURETHRAL RESECTION OF BLADDER TUMOR N/A 06/12/2019   INVASIVE HIGH GRADE PAPILLARY UROTHELIAL CARCINOMA, invading muscularis propria.  Procedure: TRANSURETHRAL RESECTION OF BLADDER TUMOR (TURBT);  Surgeon: Crista Elliot, MD;  Location: [facility];  Service: Urology;  Laterality: N/A;    Family History  Problem Relation Age of Onset   Lung cancer Father    Heart disease Father    Breast cancer Mother    Congestive Heart Failure Mother    Diabetes Mother    Breast cancer Sister    Heart disease Brother    COPD Brother    Bladder Cancer Maternal Grandfather    Colon  cancer Neg Hx    Esophageal cancer Neg Hx    Pancreatic cancer Neg Hx    Rectal cancer Neg Hx    Stomach cancer Neg Hx    Prostate cancer Neg Hx    Colon polyps Neg Hx     Social History   Socioeconomic History   Marital status: Married    Spouse name: Bonita Quin   Number of children: 3   Years of education: Not on file   Highest education level: Not on file  Occupational History   Occupation: retired  Tobacco Use   Smoking status: Former    Packs/day: 2.00    Years: 35.00    Additional pack years: 0.00    Total pack years: 70.00    Types: Cigarettes    Quit date: 09/28/1998    Years since quitting: 24.4   Smokeless tobacco: Former    Types: Snuff    Quit date: 02/09/1986   Tobacco comments:    as a teenager  Vaping Use   Vaping Use: Never used  Substance and Sexual Activity   Alcohol use: No    Alcohol/week: 0.0 standard drinks of alcohol    Comment: quit 1994   Drug use: No   Sexual activity: Not Currently  Other Topics Concern   Not on file  Social History Narrative   Married, 3 children.   Retired [employer].  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   Orig from [place], lived [place] same farm all his life.   Former smoker: quit approx around 1990.  80 pack-yr hx.   Alcohol-none, used to drink a lot of beer on weekends.  No drugs.   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   Diet: fair   Social Determinants of Health   Financial Resource Strain: Low Risk  (03/25/2022)   Overall Financial Resource Strain (CARDIA)    Difficulty of Paying Living Expenses: Not hard at all  Food Insecurity: No Food Insecurity (03/25/2022)   Hunger Vital Sign    Worried About Running Out of Food in the Last Year: Never true    Ran Out of Food in the Last Year: Never true  Transportation Needs: No Transportation Needs (03/25/2022)   PRAPARE - Administrator, Civil Service (Medical): No    Lack of Transportation (Non-Medical): No  Physical Activity: Insufficiently Active (03/25/2022)   Exercise Vital Sign    Days of Exercise per Week: 3 days    Minutes of Exercise per Session: 30 min  Stress: No Stress Concern Present (03/25/2022)   [employer] of Occupational Health - Occupational Stress Questionnaire    Feeling of Stress : Not at all  Social Connections: Moderately Integrated (03/25/2022)   Social Connection and Isolation Panel [NHANES]    Frequency of Communication with Friends and Family: More than three times a week    Frequency of Social Gatherings with Friends and Family: More than three times a week    Attends Religious Services: More than 4 times per year    Active Member of [employer] or Organizations: No    Attends Banker Meetings: Never    Marital Status: Married  Catering manager Violence: Not At Risk (03/25/2022)   Humiliation, Afraid, Rape, and Kick questionnaire    Fear of Current or Ex-Partner: No    Emotionally Abused: No    Physically Abused: No    Sexually Abused: No    Outpatient Medications Prior to Visit  Medication Sig Dispense Refill  acetaminophen (TYLENOL) 650 MG CR tablet Take 650  mg by mouth every 8 (eight) hours as needed for pain.     allopurinol (ZYLOPRIM) 300 MG tablet TAKE 1 TABLET BY MOUTH EVERY DAY 90 tablet 3   Ascorbic Acid (VITAMIN C PO) Take by mouth daily.     aspirin EC 81 MG tablet Take 81 mg by mouth daily.     Cholecalciferol (D3 PO) Take 1 capsule by mouth daily.     clopidogrel (PLAVIX) 75 MG tablet TAKE 1 TABLET BY MOUTH EVERY DAY 90 tablet 3   colchicine 0.6 MG tablet 2 tabs po x 1, then 2 hours later take 1 tab.  Starting the following day take 1 tab po bid until gout flare is resolved. 20 tablet 3   desonide (DESOWEN) 0.05 % cream APPLY TO AFFECTED AREA EVERY DAY 60 g 2   hydrocortisone cream 1 % Apply 1 application  topically 4 (four) times daily as needed for itching.     loratadine (CLARITIN) 10 MG tablet Take 20 mg by mouth daily.     metoprolol tartrate (LOPRESSOR) 25 MG tablet TAKE 0.5 TABLETS BY MOUTH 2 TIMES DAILY. 90 tablet 3   pantoprazole (PROTONIX) 40 MG tablet TAKE 1 TABLET BY MOUTH EVERY DAY 90 tablet 0   PREVIDENT 5000 BOOSTER PLUS 1.1 % PSTE Take 1 application by mouth as directed.  1   rosuvastatin (CRESTOR) 40 MG tablet TAKE 1 TABLET BY MOUTH EVERY DAY 90 tablet 3   traMADol (ULTRAM) 50 MG tablet TAKE 1 TABLET BY MOUTH THREE TIMES A DAY AS NEEDED FOR PAIN 90 tablet 0   triamcinolone (NASACORT) 55 MCG/ACT AERO nasal inhaler Place 1 spray into the nose 2 (two) times daily as needed (allergies).     EPINEPHrine 0.3 mg/0.3 mL IJ SOAJ injection Inject 0.3 mg into the muscle as needed for anaphylaxis. (Patient not taking: Reported on 03/04/2023)     No facility-administered medications prior to visit.    No Known Allergies  Review of Systems  Constitutional:  Negative for appetite change, chills, fatigue and fever.  HENT:  Negative for congestion, dental problem, ear pain and sore throat.   Eyes:  Negative for discharge, redness and visual disturbance.  Respiratory:  Negative for cough, chest tightness, shortness of breath and  wheezing.   Cardiovascular:  Negative for chest pain, palpitations and leg swelling.  Gastrointestinal:  Negative for abdominal pain, blood in stool, diarrhea, nausea and vomiting.  Genitourinary:  Negative for difficulty urinating, dysuria, flank pain, frequency, hematuria and urgency.  Musculoskeletal:  Negative for arthralgias, back pain, joint swelling, myalgias and neck stiffness.  Skin:  Negative for pallor and rash.  Neurological:  Negative for dizziness, speech difficulty, weakness and headaches.  Hematological:  Negative for adenopathy. Does not bruise/bleed easily.  Psychiatric/Behavioral:  Negative for confusion and sleep disturbance. The patient is not nervous/anxious.     PE;    03/04/2023    9:44 AM 03/04/2023    9:40 AM 10/07/2022    3:14 PM  Vitals with BMI  Height  6\' 3"  6\' 2"   Weight  210 lbs 3 oz 213 lbs 13 oz  BMI  26.27 27.44  Systolic 130 149 161  Diastolic 75 83 84  Pulse  53 60   Initial bp today was 149/83. Manual recheck was 130/75 P 53  Gen: Alert, well appearing.  Patient is oriented to person, place, time, and situation. AFFECT: pleasant, lucid thought and speech. ENT: Ears: EACs clear,  normal epithelium.  TMs with good light reflex and landmarks bilaterally.  Eyes: no injection, icteris, swelling, or exudate.  EOMI, PERRLA. Nose: no drainage or turbinate edema/swelling.  No injection or focal lesion.  Mouth: lips without lesion/swelling.  Oral mucosa pink and moist.  Dentition intact and without obvious caries or gingival swelling.  Oropharynx without erythema, exudate, or swelling.  Neck: supple/nontender.  No LAD, mass, or TM.  Carotid pulses 2+ bilaterally, without bruits. CV: RRR, no m/r/g.   LUNGS: CTA bilat, nonlabored resps, good aeration in all lung fields. ABD: soft, NT, ND, BS normal.  No hepatospenomegaly or mass.  No bruits. EXT: no clubbing, cyanosis, or edema.  Musculoskeletal: no joint swelling, erythema, warmth, or tenderness.  ROM of all  joints intact. Skin - no sores or suspicious lesions or rashes or color changes  Pertinent labs:  Lab Results  Component Value Date   TSH 2.134 05/01/2021   Lab Results  Component Value Date   WBC 7.3 02/13/2022   HGB 13.0 (L) 02/13/2022   HCT 40.5 02/13/2022   MCV 89.6 02/13/2022   PLT 192 02/13/2022   Lab Results  Component Value Date   CREATININE 1.10 08/18/2022   BUN 14 08/18/2022   NA 144 08/18/2022   K 4.3 08/18/2022   CL 105 08/18/2022   CO2 26 08/18/2022   Lab Results  Component Value Date   ALT 9 02/13/2022   AST 15 02/13/2022   ALKPHOS 53 08/11/2021   BILITOT 0.4 02/13/2022   Lab Results  Component Value Date   CHOL 144 08/18/2022   Lab Results  Component Value Date   HDL 70 08/18/2022   Lab Results  Component Value Date   LDLCALC 53 08/18/2022   Lab Results  Component Value Date   TRIG 124 08/18/2022   Lab Results  Component Value Date   CHOLHDL 2.1 08/18/2022   Lab Results  Component Value Date   PSA <0.015 01/19/2022   PSA <0.015 11/12/2020   PSA <0.015 06/04/2020   ASSESSMENT AND PLAN:   #1 health maintenance exam: Reviewed age and gender appropriate health maintenance issues (prudent diet, regular exercise, health risks of tobacco and excessive alcohol, use of seatbelts, fire alarms in home, use of sunscreen).  Also reviewed age and gender appropriate health screening as well as vaccine recommendations. Vaccines: ALL UTD. Labs: CMET, FLP, CBC. Prostate ca screening: n/a d/t hx of prostatectomy->followed by urol. Colon ca screening: hx of polyps, most recently 05/27/20.  Recall 3 yrs--- coming up soon.  #2 hypertension, well-controlled on Lopressor 12.5 mg twice daily.  3.  Hypercholesterolemia, doing well on rosuvastatin 40 mg a day.  #4 chronic pain syndrome.  Bilateral low back pain with intermittent bilateral sciatica. Well-controlled using tramadol 50 mg 1-3 times a day and gabapentin 300 mg 3 times daily. Continue this--> a  new prescription was not needed today.. Controlled substance contract and urine drug screen are up-to-date.  An After Visit Summary was printed and given to the patient.  FOLLOW UP:  Return in about 6 months (around 09/03/2023) for routine chronic illness f/u.  Signed:  Santiago Bumpers, MD           03/04/2023

## 2023-03-05 ENCOUNTER — Encounter: Payer: Self-pay | Admitting: Family Medicine

## 2023-03-05 LAB — COMPREHENSIVE METABOLIC PANEL
AG Ratio: 1.8 (calc) (ref 1.0–2.5)
ALT: 12 U/L (ref 9–46)
AST: 18 U/L (ref 10–35)
Albumin: 4.3 g/dL (ref 3.6–5.1)
Alkaline phosphatase (APISO): 55 U/L (ref 35–144)
BUN/Creatinine Ratio: 12 (calc) (ref 6–22)
BUN: 15 mg/dL (ref 7–25)
CO2: 27 mmol/L (ref 20–32)
Calcium: 9.8 mg/dL (ref 8.6–10.3)
Chloride: 108 mmol/L (ref 98–110)
Creat: 1.23 mg/dL — ABNORMAL HIGH (ref 0.70–1.22)
Globulin: 2.4 g/dL (calc) (ref 1.9–3.7)
Glucose, Bld: 92 mg/dL (ref 65–99)
Potassium: 4.9 mmol/L (ref 3.5–5.3)
Sodium: 146 mmol/L (ref 135–146)
Total Bilirubin: 0.6 mg/dL (ref 0.2–1.2)
Total Protein: 6.7 g/dL (ref 6.1–8.1)

## 2023-03-05 LAB — CBC
HCT: 43.3 % (ref 38.5–50.0)
Hemoglobin: 14.2 g/dL (ref 13.2–17.1)
MCH: 30 pg (ref 27.0–33.0)
MCHC: 32.8 g/dL (ref 32.0–36.0)
MCV: 91.5 fL (ref 80.0–100.0)
MPV: 11.3 fL (ref 7.5–12.5)
Platelets: 175 10*3/uL (ref 140–400)
RBC: 4.73 10*6/uL (ref 4.20–5.80)
RDW: 14.5 % (ref 11.0–15.0)
WBC: 7.2 10*3/uL (ref 3.8–10.8)

## 2023-03-05 LAB — LIPID PANEL
Cholesterol: 133 mg/dL (ref <200)
HDL: 64 mg/dL (ref >=40)
LDL Cholesterol (Calc): 51 mg/dL (calc)
Non-HDL Cholesterol (Calc): 69 mg/dL (calc) (ref <130)
Total CHOL/HDL Ratio: 2.1 (calc) (ref <5.0)
Triglycerides: 92 mg/dL (ref <150)

## 2023-03-09 ENCOUNTER — Telehealth: Payer: Self-pay

## 2023-03-09 NOTE — Telephone Encounter (Signed)
Returned call and given pt results.

## 2023-03-09 NOTE — Telephone Encounter (Signed)
Patient returning call about lab results.  Please call patient back when available.  [phone]

## 2023-03-23 NOTE — Addendum Note (Signed)
Addended by: Emi Holes D on: 03/23/2023 04:51 PM   Modules accepted: Orders

## 2023-03-25 ENCOUNTER — Other Ambulatory Visit (INDEPENDENT_AMBULATORY_CARE_PROVIDER_SITE_OTHER): Payer: Medicare HMO

## 2023-03-25 DIAGNOSIS — R7989 Other specified abnormal findings of blood chemistry: Secondary | ICD-10-CM

## 2023-03-25 LAB — BASIC METABOLIC PANEL
BUN: 16 mg/dL (ref 6–23)
CO2: 29 mEq/L (ref 19–32)
Calcium: 9.7 mg/dL (ref 8.4–10.5)
Chloride: 105 mEq/L (ref 96–112)
Creatinine, Ser: 1.12 mg/dL (ref 0.40–1.50)
GFR: 62.18 mL/min (ref >60.00)
Glucose, Bld: 87 mg/dL (ref 70–99)
Potassium: 4.6 mEq/L (ref 3.5–5.1)
Sodium: 141 mEq/L (ref 135–145)

## 2023-03-26 ENCOUNTER — Other Ambulatory Visit: Payer: Self-pay | Admitting: Family Medicine

## 2023-03-26 NOTE — Telephone Encounter (Signed)
RF request for allopurinol  LOV: 03/04/23 Next ov: 09/03/23 Last written: 03/09/22 (90,3)  Requesting: tramadol Contract: 08/18/22 UDS: 08/18/22 Last Visit: 03/04/23 Next Visit: 09/03/23 Last Refill: 02/23/23 (90,0)  Please Advise. Meds pending

## 2023-04-13 DIAGNOSIS — Z8551 Personal history of malignant neoplasm of bladder: Secondary | ICD-10-CM | POA: Diagnosis not present

## 2023-04-13 DIAGNOSIS — Z936 Other artificial openings of urinary tract status: Secondary | ICD-10-CM | POA: Diagnosis not present

## 2023-04-26 ENCOUNTER — Other Ambulatory Visit: Payer: Self-pay | Admitting: Nurse Practitioner

## 2023-05-04 DIAGNOSIS — Z936 Other artificial openings of urinary tract status: Secondary | ICD-10-CM | POA: Diagnosis not present

## 2023-05-04 DIAGNOSIS — Z8551 Personal history of malignant neoplasm of bladder: Secondary | ICD-10-CM | POA: Diagnosis not present

## 2023-05-12 ENCOUNTER — Telehealth: Payer: Medicare HMO | Admitting: Family

## 2023-05-12 ENCOUNTER — Telehealth: Payer: Medicare HMO

## 2023-05-12 DIAGNOSIS — U071 COVID-19: Secondary | ICD-10-CM | POA: Diagnosis not present

## 2023-05-12 HISTORY — DX: COVID-19: U07.1

## 2023-05-12 MED ORDER — PAXLOVID (300/100) 20 X 150 MG & 10 X 100MG PO TBPK: ORAL_TABLET | ORAL | 0 refills | Status: DC

## 2023-05-12 MED ORDER — BENZONATATE 100 MG PO CAPS: 100.0000 mg | ORAL_CAPSULE | Freq: Three times a day (TID) | ORAL | 0 refills | Status: DC | PRN

## 2023-05-12 NOTE — Progress Notes (Signed)
MyChart Video Visit    Virtual Visit via Video Note    Patient location: Home. Patient and provider in visit Provider location: Office  I discussed the limitations of evaluation and management by telemedicine and the availability of in person appointments. The patient expressed understanding and agreed to proceed.  Visit Date: 05/12/2023  Today's healthcare provider: Lemont Fillers, NP     Subjective:    Patient ID: Brandon Haynes, male    DOB: 1941/08/04, 82 y.o.   MRN: [id number]  Chief Complaint  Patient presents with   Covid Positive    Patient reports having a positive covid test today   Cough    Patient complains of coughing since yesterday   Headache    Complains of headaches     HPI  Past Medical History:  Diagnosis Date   Allergic rhinitis    allergy shots via [place]   Anxiety    Bladder cancer (HCC) 05/2019   [id number].  Lesion resected. No mets.  He got neoadjuvant chemo x 4 cycles.  Cystoprostectomy with ileal conduit formation 12/13/19 (Dr. Berneice Heinrich). 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 difficile colitis 01/09/2020; late May 2021   oral vancomycin   CAD (coronary artery disease)    mid LAD stent 2016; CABG 04/2021   CAD, multiple vessel    a. CP/near-syncope 11/2014 s/p DES to MLAD, occluded RCA after takeoff of RV marginal with collaterals treated medically, otherwise mild nonobstructive disease. EF 55%;  b. 07/2015 Lexiscan CL: EF 56%, no ischemia/infarct.    Cataracts, both eyes    Chronic constipation    worse since ileal conduit surgery was done 2020   Chronic low back pain    DDD (degenerative disc disease), lumbar    GERD (gastroesophageal reflux disease)    PPI bid needed to control sx's   Gout    Hearing loss    History  of basal cell carcinoma    History of adenomatous polyp of colon    History of prostate cancer 11/2019   grd 2 cancer with neg margins incidental on cystectomy  path 11/2019.   Hypercholesteremia    Hypertension    Hx labile/uncontrolled.  Improved s/p visit to HTN clinic 2019.   Iron deficiency anemia 2017   Colonoscopy, EGD, and capsule endoscopy w/out obvious cause/source found.  Oral iron helping as of 03/2016.   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 No avid enhancement or mass effect. Obs by urol as of 10/2019 and 11/2020.    Past Surgical History:  Procedure Laterality Date   back injection  oct 2011, 07/2010, 12/2012   no surgery   Capsule endoscopy  02/2016   mild duodenitis, o/w NEG   CARDIOVASCULAR STRESS TEST  05/2009; 07/2015   carotid dopplers  11/2014   Bilateral - 1% to 9% ICA stenosis. Vertebral artery flow is   CATARACT EXTRACTION, BILATERAL     Jan, Feb 2022   COLONOSCOPY  08/2008; 02/2016; 02/2017; 05/27/20   2017: 12 polyps (adenomatous).  02/2017 repeat TCS--multiple polyps. 04/2020 +adenomas.   CORONARY ANGIOPLASTY WITH STENT PLACEMENT  12/04/2014   4.0 x 12 mm Synergy stent to the mid LAD   CORONARY ARTERY BYPASS GRAFT N/A 05/06/2021   Procedure: OFF PUMP  CORONARY ARTERY BYPASS GRAFTING (CABG) X TWO, USING LEFT INTERNAL MAMMARY ARTERY AND RIGHT LEG GREATER SAPHENOUS VEIN HARVESTED ENDOSCOPICALLY;  Surgeon: Corliss Skains, MD;  Location: [facility] OR;  Service: Open Heart Surgery;  Laterality: N/A;   CYSTOSCOPY  05/24/2019   CYSTOSCOPY W/ TRANSURETHRAL RESECTION OF POSTERIOR URETHERAL VALVES  06/12/2019   2-5cm   CYSTOSCOPY WITH INJECTION N/A 12/13/2019   Procedure: CYSTOSCOPY WITH INJECTION;  Surgeon: Sebastian Ache, MD;  Location: [facility];  Service: Urology;  Laterality: N/A;   ESOPHAGOGASTRODUODENOSCOPY  02/2016   Gastritis.  NEG h pylori.     IR IMAGING GUIDED PORT INSERTION  09/12/2019   IR REMOVAL TUN ACCESS W/ PORT W/O FL MOD SED  08/20/2020   LE  arterial vascular study  02/2016   Per Dr. Mayford Knife, no evidence of peripheral vascular disease   LEFT HEART CATH AND CORONARY ANGIOGRAPHY N/A 05/01/2021   Procedure: LEFT HEART CATH AND CORONARY ANGIOGRAPHY;  Surgeon: Kathleene Hazel, MD;  Location: [facility];  Service: Cardiovascular;  Laterality: N/A;   LEFT HEART CATHETERIZATION WITH CORONARY ANGIOGRAM N/A 12/04/2014   Procedure: LEFT HEART CATHETERIZATION WITH CORONARY ANGIOGRAM;  Surgeon: Dolores Patty, MD; LAD 99% then aneurysmal, OM1 40%, CFX 50%, RCA 40%, 50% then occluded   LYMPHADENECTOMY Bilateral 12/13/2019   Procedure: LYMPHADENECTOMY;  Surgeon: Sebastian Ache, MD;  Location: [facility];  Service: Urology;  Laterality: Bilateral;   ROBOT ASSISTED LAPAROSCOPIC COMPLETE CYSTECT ILEAL CONDUIT N/A 12/13/2019   Procedure: XI ROBOTIC ASSISTED LAPAROSCOPIC COMPLETE CYSTECT ILEAL CONDUIT AND RADICAL PROSTATECTOMY;  Surgeon: Sebastian Ache, MD;  Location: [facility];  Service: Urology;  Laterality: N/A;  6 HRS   ROTATOR CUFF REPAIR Left 09/2013   left   SHOULDER INJECTION  10/2012   TEE WITHOUT CARDIOVERSION N/A 05/06/2021   Procedure: TRANSESOPHAGEAL ECHOCARDIOGRAM (TEE);  Surgeon: Corliss Skains, MD;  Location: [facility] OR;  Service: Open Heart Surgery;  Laterality: N/A;   TRANSTHORACIC ECHOCARDIOGRAM  11/2014   EF 50-55%, some wall motion abnormalities noted, grade I diast dysfxn   TRANSTHORACIC ECHOCARDIOGRAM  05/01/2021   pre CABG, EF 55-50%, nl wm, grd I DD, valves fine.   TRANSURETHRAL RESECTION OF BLADDER TUMOR N/A 06/12/2019   INVASIVE HIGH GRADE PAPILLARY UROTHELIAL CARCINOMA, invading muscularis propria.  Procedure: TRANSURETHRAL RESECTION OF BLADDER TUMOR (TURBT);  Surgeon: Crista Elliot, MD;  Location: [facility];  Service: Urology;  Laterality: N/A;    Family History  Problem Relation Age of Onset   Lung cancer Father    Heart disease Father    Breast cancer Mother    Congestive Heart Failure Mother     Diabetes Mother    Breast cancer Sister    Heart disease Brother    COPD Brother    Bladder Cancer Maternal Grandfather    Colon cancer Neg Hx    Esophageal cancer Neg Hx    Pancreatic cancer Neg Hx    Rectal cancer Neg Hx    Stomach cancer Neg Hx    Prostate cancer Neg Hx    Colon polyps Neg Hx     Social History   Socioeconomic History   Marital status: Married    Spouse name: Bonita Quin   Number of children: 3   Years of education: Not on file   Highest education level: Not on file  Occupational History   Occupation: retired  Tobacco Use   Smoking status: Former    Current packs/day: 0.00    Average packs/day: 2.0 packs/day  for 35.0 years (70.0 ttl pk-yrs)    Types: Cigarettes    Start date: 09/29/1963    Quit date: 09/28/1998    Years since quitting: 24.6   Smokeless tobacco: Former    Types: Snuff    Quit date: 02/09/1986   Tobacco comments:    as a teenager  Vaping Use   Vaping status: Never Used  Substance and Sexual Activity   Alcohol use: No    Alcohol/week: 0.0 standard drinks of alcohol    Comment: quit 1994   Drug use: No   Sexual activity: Not Currently  Other Topics Concern   Not on file  Social History Narrative   Married, 3 children.   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   Former smoker: quit approx around 1990.  80 pack-yr hx.   Alcohol-none, used to drink a lot of beer on weekends.  No drugs.   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   Diet: fair   Social Determinants of Health   Financial Resource Strain: Low Risk  (03/25/2022)   Overall Financial Resource Strain (CARDIA)    Difficulty of Paying Living Expenses: Not hard at all  Food Insecurity: No Food Insecurity (03/25/2022)   Hunger Vital Sign    Worried About Running Out of Food in the Last Year: Never true    Ran Out of Food in the Last Year: Never true  Transportation Needs: No Transportation Needs  (03/25/2022)   PRAPARE - Administrator, Civil Service (Medical): No    Lack of Transportation (Non-Medical): No  Physical Activity: Insufficiently Active (03/25/2022)   Exercise Vital Sign    Days of Exercise per Week: 3 days    Minutes of Exercise per Session: 30 min  Stress: No Stress Concern Present (03/25/2022)   [employer] of Occupational Health - Occupational Stress Questionnaire    Feeling of Stress : Not at all  Social Connections: Moderately Integrated (03/25/2022)   Social Connection and Isolation Panel [NHANES]    Frequency of Communication with Friends and Family: More than three times a week    Frequency of Social Gatherings with Friends and Family: More than three times a week    Attends Religious Services: More than 4 times per year    Active Member of [employer] or Organizations: No    Attends Banker Meetings: Never    Marital Status: Married  Catering manager Violence: Not At Risk (03/25/2022)   Humiliation, Afraid, Rape, and Kick questionnaire    Fear of Current or Ex-Partner: No    Emotionally Abused: No    Physically Abused: No    Sexually Abused: No    Outpatient Medications Prior to Visit  Medication Sig Dispense Refill   acetaminophen (TYLENOL) 650 MG CR tablet Take 650 mg by mouth every 8 (eight) hours as needed for pain.     allopurinol (ZYLOPRIM) 300 MG tablet TAKE 1 TABLET BY MOUTH EVERY DAY 90 tablet 3   Ascorbic Acid (VITAMIN C PO) Take by mouth daily.     aspirin EC 81 MG tablet Take 81 mg by mouth daily.     Cholecalciferol (D3 PO) Take 1 capsule by mouth daily.     clopidogrel (PLAVIX) 75 MG tablet TAKE 1 TABLET BY MOUTH EVERY DAY 90 tablet 3   colchicine 0.6 MG tablet 2 tabs po x 1, then 2 hours later take 1  tab.  Starting the following day take 1 tab po bid until gout flare is resolved. 20 tablet 3   desonide (DESOWEN) 0.05 % cream APPLY TO AFFECTED AREA EVERY DAY 60 g 2   EPINEPHrine 0.3 mg/0.3 mL IJ SOAJ injection Inject  0.3 mg into the muscle as needed for anaphylaxis.     hydrocortisone cream 1 % Apply 1 application  topically 4 (four) times daily as needed for itching.     loratadine (CLARITIN) 10 MG tablet Take 20 mg by mouth daily.     metoprolol tartrate (LOPRESSOR) 25 MG tablet TAKE 0.5 TABLETS BY MOUTH 2 TIMES DAILY. 90 tablet 3   pantoprazole (PROTONIX) 40 MG tablet Take 1 tablet (40 mg total) by mouth daily. Patient needs follow up appointment for future refills. Please call [phone] to schedule an appointment. 90 tablet 0   PREVIDENT 5000 BOOSTER PLUS 1.1 % PSTE Take 1 application by mouth as directed.  1   rosuvastatin (CRESTOR) 40 MG tablet TAKE 1 TABLET BY MOUTH EVERY DAY 90 tablet 3   traMADol (ULTRAM) 50 MG tablet TAKE 1 TABLET BY MOUTH THREE TIMES A DAY AS NEEDED FOR PAIN 90 tablet 5   triamcinolone (NASACORT) 55 MCG/ACT AERO nasal inhaler Place 1 spray into the nose 2 (two) times daily as needed (allergies).     No facility-administered medications prior to visit.    No Known Allergies  ROS    See HPI Objective:    Physical Exam  There were no vitals taken for this visit. Wt Readings from Last 3 Encounters:  03/04/23 210 lb 3.2 oz (95.3 kg)  10/07/22 213 lb 12.8 oz (97 kg)  08/18/22 213 lb 6.4 oz (96.8 kg)       Gen: Awake, alert, no acute distress Resp: Breathing is even and non-labored, coarse cough noted Psych: calm/pleasant demeanor Neuro: Alert and Oriented x 3, + facial symmetry, speech is clear.  Assessment & Plan:   Problem List Items Addressed This Visit       Unprioritized   COVID-19 virus infection - Primary    Second episode of COVID-19 with symptoms of sore throat, headache, cough, and weakness. Fully vaccinated with booster. Previously treated with Paxlovid without complications. -Prescribe Paxlovid  -Rx tessalon prn cough, and mucinex as needed for congestion.  -Advise to stop taking colchicine and vitamin C while on paxlovid due to potential drug  interaction with Paxlovid. -Advise strict quarantine for 5 days, starting from the first day of symptoms. -Advise to seek emergency care for worsening weakness, high fever, or shortness of breath.      Relevant Medications   nirmatrelvir & ritonavir (PAXLOVID, 300/100,) 20 x 150 MG & 10 x 100MG  TBPK    I am having Brandon Haynes "MIKE" start on Paxlovid (300/100) and benzonatate. I am also having him maintain his loratadine, PreviDent 5000 Booster Plus, aspirin EC, Cholecalciferol (D3 PO), acetaminophen, EPINEPHrine, hydrocortisone cream, triamcinolone, desonide, colchicine, metoprolol tartrate, rosuvastatin, clopidogrel, Ascorbic Acid (VITAMIN C PO), allopurinol, traMADol, and pantoprazole.  Meds ordered this encounter  Medications   nirmatrelvir & ritonavir (PAXLOVID, 300/100,) 20 x 150 MG & 10 x 100MG  TBPK    Sig: Take per package instructions    Dispense:  30 tablet    Refill:  0    Order Specific Question:   Supervising Provider    Answer:   Danise Edge A [[id number]]   benzonatate (TESSALON) 100 MG capsule    Sig: Take 1 capsule (100 mg total) by  mouth 3 (three) times daily as needed.    Dispense:  20 capsule    Refill:  0    Order Specific Question:   Supervising Provider    Answer:   Danise Edge A [[id number]]    I discussed the assessment and treatment plan with the patient. The patient was provided an opportunity to ask questions and all were answered. The patient agreed with the plan and demonstrated an understanding of the instructions.   The patient was advised to call back or seek an in-person evaluation if the symptoms worsen or if the condition fails to improve as anticipated.   Lemont Fillers, NP [facility] at [facility] [phone] (phone) [phone] (fax)  [facility]

## 2023-05-12 NOTE — Assessment & Plan Note (Addendum)
Second episode of COVID-19 with symptoms of sore throat, headache, cough, and weakness. Fully vaccinated with booster. Previously treated with Paxlovid without complications. -Prescribe Paxlovid  -Rx tessalon prn cough, and mucinex as needed for congestion.  -Advise to stop taking colchicine and vitamin C while on paxlovid due to potential drug interaction with Paxlovid. -Advise strict quarantine for 5 days, starting from the first day of symptoms. -Advise to seek emergency care for worsening weakness, high fever, or shortness of breath.

## 2023-05-26 ENCOUNTER — Other Ambulatory Visit: Payer: Self-pay | Admitting: Nurse Practitioner

## 2023-06-14 DIAGNOSIS — Z936 Other artificial openings of urinary tract status: Secondary | ICD-10-CM | POA: Diagnosis not present

## 2023-06-14 DIAGNOSIS — Z8551 Personal history of malignant neoplasm of bladder: Secondary | ICD-10-CM | POA: Diagnosis not present

## 2023-06-22 DIAGNOSIS — Z936 Other artificial openings of urinary tract status: Secondary | ICD-10-CM | POA: Diagnosis not present

## 2023-06-22 DIAGNOSIS — Z8551 Personal history of malignant neoplasm of bladder: Secondary | ICD-10-CM | POA: Diagnosis not present

## 2023-06-29 DIAGNOSIS — D485 Neoplasm of uncertain behavior of skin: Secondary | ICD-10-CM | POA: Diagnosis not present

## 2023-06-29 DIAGNOSIS — L570 Actinic keratosis: Secondary | ICD-10-CM | POA: Diagnosis not present

## 2023-06-29 DIAGNOSIS — L821 Other seborrheic keratosis: Secondary | ICD-10-CM | POA: Diagnosis not present

## 2023-06-29 DIAGNOSIS — L814 Other melanin hyperpigmentation: Secondary | ICD-10-CM | POA: Diagnosis not present

## 2023-07-05 ENCOUNTER — Ambulatory Visit (INDEPENDENT_AMBULATORY_CARE_PROVIDER_SITE_OTHER): Payer: Medicare HMO | Admitting: Family Medicine

## 2023-07-05 ENCOUNTER — Encounter: Payer: Self-pay | Admitting: Family Medicine

## 2023-07-05 ENCOUNTER — Telehealth: Payer: Self-pay | Admitting: Cardiology

## 2023-07-05 VITALS — BP 125/77 | HR 66 | Wt 208.2 lb

## 2023-07-05 DIAGNOSIS — E538 Deficiency of other specified B group vitamins: Secondary | ICD-10-CM

## 2023-07-05 DIAGNOSIS — Z951 Presence of aortocoronary bypass graft: Secondary | ICD-10-CM | POA: Diagnosis not present

## 2023-07-05 DIAGNOSIS — Z862 Personal history of diseases of the blood and blood-forming organs and certain disorders involving the immune mechanism: Secondary | ICD-10-CM | POA: Diagnosis not present

## 2023-07-05 DIAGNOSIS — I2510 Atherosclerotic heart disease of native coronary artery without angina pectoris: Secondary | ICD-10-CM

## 2023-07-05 DIAGNOSIS — R0609 Other forms of dyspnea: Secondary | ICD-10-CM

## 2023-07-05 DIAGNOSIS — R5383 Other fatigue: Secondary | ICD-10-CM

## 2023-07-05 NOTE — Telephone Encounter (Signed)
Returned Pts call. Pt stated for the past few months his energy has been lower and he has had DOE. SOB stops with rest. This past Saturday as he was climbing stairs in a museum he became SOB and dizzy. Both went away with rest. Denies chest pains or other symptoms.  BP's have been 118/63, 118/65 and this morning was 116/65. Stated he called because his daughter said if he didn't call and get a sooner appt to be looked over she was going to. Told pt I would send message to Dr Mayford Knife and her nurse for review. Gave Pt 911 precautions in case of new symptoms.

## 2023-07-05 NOTE — Progress Notes (Signed)
OFFICE VISIT  07/05/2023  CC:  Chief Complaint  Patient presents with   Fatigue    Ongoing for a few months; Pt states when he does any physical activity it increases his fatigue. Pt states cardiologist told him he needed to have thyroid checked as well as for anemia before being seen there.     Patient is a 82 y.o. male who presents for fatigue.  HPI: Brandon Haynes reports gradually increasing problem with fatigue.  He feels this at rest but when he gets up and walks around or goes upstairs he feels really tired.  Recently he was walking up a flight of stairs and felt very tired and got dizzy.  Denies chest pain, chest tightness, nausea, diaphoresis, or palpitations. No cough.  No melena or hematochezia. No lower extremity swelling or abnormal weight gain or loss. He drinks about 50 ounces of fluids per day.  He does take aspirin and Plavix.  ROS as above, plus--> no fevers, no wheezing, no Has.  No polyuria or polydipsia.  No myalgias or arthralgias.  No focal weakness, paresthesias, or tremors.  No acute vision or hearing abnormalities.  No dysuria or unusual/new urinary urgency or frequency.  No recent changes in lower legs. No n/v/d or abd pain.     Past Medical History:  Diagnosis Date   Allergic rhinitis    allergy shots via [place]   Anxiety    Bladder cancer (HCC) 05/2019   [id number].  Lesion resected. No mets.  He got neoadjuvant chemo x 4 cycles.  Cystoprostectomy with ileal conduit formation 12/13/19 (Dr. Berneice Heinrich). 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 difficile colitis 01/09/2020; late May 2021   oral vancomycin   CAD (coronary artery disease)    mid LAD stent 2016; CABG 04/2021   CAD, multiple vessel    a. CP/near-syncope 11/2014 s/p DES to MLAD, occluded RCA after takeoff of RV marginal with collaterals treated medically, otherwise mild nonobstructive disease. EF 55%;  b. 07/2015 Lexiscan CL: EF 56%, no ischemia/infarct.     Cataracts, both eyes    Chronic constipation    worse since ileal conduit surgery was done 2020   Chronic low back pain    DDD (degenerative disc disease), lumbar    GERD (gastroesophageal reflux disease)    PPI bid needed to control sx's   Gout    Hearing loss    History  of basal cell carcinoma    History of adenomatous polyp of colon    History of prostate cancer 11/2019   grd 2 cancer with neg margins incidental on cystectomy path 11/2019.   Hypercholesteremia    Hypertension    Hx labile/uncontrolled.  Improved s/p visit to HTN clinic 2019.   Iron deficiency anemia 2017   Colonoscopy, EGD, and capsule endoscopy w/out obvious cause/source found.  Oral iron helping as of 03/2016.   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 No avid enhancement or mass effect. Obs by urol as of 10/2019 and 11/2020.    Past Surgical History:  Procedure Laterality Date   back injection  oct 2011, 07/2010, 12/2012   no surgery   Capsule endoscopy  02/2016   mild duodenitis, o/w NEG   CARDIOVASCULAR STRESS TEST  05/2009; 07/2015   carotid dopplers  11/2014   Bilateral - 1% to 9% ICA stenosis. Vertebral artery flow is   CATARACT EXTRACTION, BILATERAL     Jan, Feb 2022   COLONOSCOPY  08/2008; 02/2016; 02/2017; 05/27/20   2017: 12 polyps (adenomatous).  02/2017 repeat TCS--multiple polyps. 04/2020 +adenomas.   CORONARY ANGIOPLASTY WITH STENT PLACEMENT  12/04/2014   4.0 x 12 mm Synergy stent to the mid LAD   CORONARY ARTERY BYPASS GRAFT N/A 05/06/2021   Procedure: OFF PUMP CORONARY ARTERY BYPASS GRAFTING (CABG) X TWO, USING LEFT INTERNAL MAMMARY ARTERY AND RIGHT LEG GREATER SAPHENOUS VEIN HARVESTED ENDOSCOPICALLY;  Surgeon: Corliss Skains, MD;  Location: [facility] OR;  Service: Open Heart Surgery;  Laterality: N/A;   CYSTOSCOPY  05/24/2019    CYSTOSCOPY W/ TRANSURETHRAL RESECTION OF POSTERIOR URETHERAL VALVES  06/12/2019   2-5cm   CYSTOSCOPY WITH INJECTION N/A 12/13/2019   Procedure: CYSTOSCOPY WITH INJECTION;  Surgeon: Sebastian Ache, MD;  Location: [facility];  Service: Urology;  Laterality: N/A;   ESOPHAGOGASTRODUODENOSCOPY  02/2016   Gastritis.  NEG h pylori.     IR IMAGING GUIDED PORT INSERTION  09/12/2019   IR REMOVAL TUN ACCESS W/ PORT W/O FL MOD SED  08/20/2020   LE arterial vascular study  02/2016   Per Dr. Mayford Knife, no evidence of peripheral vascular disease   LEFT HEART CATH AND CORONARY ANGIOGRAPHY N/A 05/01/2021   Procedure: LEFT HEART CATH AND CORONARY ANGIOGRAPHY;  Surgeon: Kathleene Hazel, MD;  Location: [facility];  Service: Cardiovascular;  Laterality: N/A;   LEFT HEART CATHETERIZATION WITH CORONARY ANGIOGRAM N/A 12/04/2014   Procedure: LEFT HEART CATHETERIZATION WITH CORONARY ANGIOGRAM;  Surgeon: Dolores Patty, MD; LAD 99% then aneurysmal, OM1 40%, CFX 50%, RCA 40%, 50% then occluded   LYMPHADENECTOMY Bilateral 12/13/2019   Procedure: LYMPHADENECTOMY;  Surgeon: Sebastian Ache, MD;  Location: [facility];  Service: Urology;  Laterality: Bilateral;   ROBOT ASSISTED LAPAROSCOPIC COMPLETE CYSTECT ILEAL CONDUIT N/A 12/13/2019   Procedure: XI ROBOTIC ASSISTED LAPAROSCOPIC COMPLETE CYSTECT ILEAL CONDUIT AND RADICAL PROSTATECTOMY;  Surgeon: Sebastian Ache, MD;  Location: [facility];  Service: Urology;  Laterality: N/A;  6 HRS   ROTATOR CUFF REPAIR Left 09/2013   left   SHOULDER INJECTION  10/2012   TEE WITHOUT CARDIOVERSION N/A 05/06/2021   Procedure: TRANSESOPHAGEAL ECHOCARDIOGRAM (TEE);  Surgeon: Corliss Skains, MD;  Location[facility];  Service: Open Heart Surgery;  Laterality: N/A;   TRANSTHORACIC ECHOCARDIOGRAM  11/2014   EF 50-55%, some wall motion abnormalities noted, grade I diast dysfxn   TRANSTHORACIC ECHOCARDIOGRAM  05/01/2021   pre CABG, EF 55-50%, nl wm, grd I DD, valves fine.   TRANSURETHRAL  RESECTION OF BLADDER TUMOR N/A 06/12/2019   INVASIVE HIGH GRADE PAPILLARY UROTHELIAL CARCINOMA, invading muscularis propria.  Procedure: TRANSURETHRAL RESECTION OF BLADDER TUMOR (TURBT);  Surgeon: Crista Elliot, MD;  Location: [facility];  Service: Urology;  Laterality: N/A;    Outpatient Medications Prior to Visit  Medication Sig Dispense Refill   acetaminophen (TYLENOL) 650 MG CR tablet Take 650 mg by mouth every 8 (eight) hours as needed for pain.     allopurinol (ZYLOPRIM) 300 MG tablet TAKE 1 TABLET BY MOUTH EVERY DAY 90 tablet 3   Ascorbic Acid (VITAMIN C PO) Take by mouth daily.     aspirin EC 81 MG tablet Take 81 mg by mouth daily.     benzonatate (TESSALON) 100 MG capsule Take 1 capsule (100 mg total) by mouth 3 (three) times daily as needed. 20  capsule 0   Cholecalciferol (D3 PO) Take 1 capsule by mouth daily.     clopidogrel (PLAVIX) 75 MG tablet TAKE 1 TABLET BY MOUTH EVERY DAY 90 tablet 3   colchicine 0.6 MG tablet 2 tabs po x 1, then 2 hours later take 1 tab.  Starting the following day take 1 tab po bid until gout flare is resolved. 20 tablet 3   desonide (DESOWEN) 0.05 % cream APPLY TO AFFECTED AREA EVERY DAY 60 g 2   EPINEPHrine 0.3 mg/0.3 mL IJ SOAJ injection Inject 0.3 mg into the muscle as needed for anaphylaxis.     hydrocortisone cream 1 % Apply 1 application  topically 4 (four) times daily as needed for itching.     loratadine (CLARITIN) 10 MG tablet Take 20 mg by mouth daily.     metoprolol tartrate (LOPRESSOR) 25 MG tablet TAKE 0.5 TABLETS BY MOUTH 2 TIMES DAILY. 90 tablet 3   nirmatrelvir & ritonavir (PAXLOVID, 300/100,) 20 x 150 MG & 10 x 100MG  TBPK Take per package instructions 30 tablet 0   pantoprazole (PROTONIX) 40 MG tablet TAKE 1 TABLET BY MOUTH DAILY. PATIENT NEEDS FOLLOW UP APPOINTMENT FOR FUTURE REFILLS. 30 tablet 0   PREVIDENT 5000 BOOSTER PLUS 1.1 % PSTE Take 1 application by mouth as directed.  1   rosuvastatin (CRESTOR) 40 MG tablet TAKE 1 TABLET BY  MOUTH EVERY DAY 90 tablet 3   traMADol (ULTRAM) 50 MG tablet TAKE 1 TABLET BY MOUTH THREE TIMES A DAY AS NEEDED FOR PAIN 90 tablet 5   triamcinolone (NASACORT) 55 MCG/ACT AERO nasal inhaler Place 1 spray into the nose 2 (two) times daily as needed (allergies).     No facility-administered medications prior to visit.    No Known Allergies  Review of Systems  As per HPI  PE:    07/05/2023    3:39 PM 03/04/2023    9:44 AM 03/04/2023    9:40 AM  Vitals with BMI  Height   6\' 3"   Weight 208 lbs 3 oz  210 lbs 3 oz  BMI   26.27  Systolic 125 130 962  Diastolic 77 75 83  Pulse 66  53     Physical Exam  Gen: Alert, well appearing.  Patient is oriented to person, place, time, and situation. AFFECT: pleasant, lucid thought and speech. Neck: no TM or nodule CV: RRR, no m/r/g.   LUNGS: CTA bilat, nonlabored resps, good aeration in all lung fields. ABD: soft, NT/ND EXT: no clubbing or cyanosis.  no edema.   LABS:  Last CBC Lab Results  Component Value Date   WBC 7.2 03/04/2023   HGB 14.2 03/04/2023   HCT 43.3 03/04/2023   MCV 91.5 03/04/2023   MCH 30.0 03/04/2023   RDW 14.5 03/04/2023   PLT 175 03/04/2023   Last metabolic panel Lab Results  Component Value Date   GLUCOSE 87 03/25/2023   NA 141 03/25/2023   K 4.6 03/25/2023   CL 105 03/25/2023   CO2 29 03/25/2023   BUN 16 03/25/2023   CREATININE 1.12 03/25/2023   GFR 62.18 03/25/2023   CALCIUM 9.7 03/25/2023   PHOS 4.0 02/21/2020   PROT 6.7 03/04/2023   ALBUMIN 4.4 08/11/2021   BILITOT 0.6 03/04/2023   ALKPHOS 53 08/11/2021   AST 18 03/04/2023   ALT 12 03/04/2023   ANIONGAP 8 05/09/2021   Last lipids Lab Results  Component Value Date   CHOL 133 03/04/2023   HDL 64 03/04/2023  LDLCALC 51 03/04/2023   TRIG 92 03/04/2023   CHOLHDL 2.1 03/04/2023   Last hemoglobin A1c Lab Results  Component Value Date   HGBA1C 5.7 (H) 05/06/2021   Last thyroid functions Lab Results  Component Value Date   TSH 2.134  05/01/2021   Last vitamin B12 and Folate Lab Results  Component Value Date   VITAMINB12 173 (L) 05/01/2021   FOLATE 17.4 02/10/2016   12 lead EKG today: Sinus rhythm, rate 62, left anterior fascicular block.  Voltage criteria for LVH in aVL.  No change compared to EKG 10/07/2022.  IMPRESSION AND PLAN:  #1 progressive fatigue, dyspnea on exertion. EKG without acute abnormality today. Check TSH, CBC with differential, c-Met, and vitamin B12. Obtain chest x-ray. If all normal then Brandon Haynes will arrange follow-up with his cardiologist for consideration of further workup for anginal equivalent or HF.  2.  History of vitamin B12 deficiency. He is not on B12 supplement. Recheck B12 today but I am hesitant to blame all of his symptoms on B12 deficiency.  #3 coronary artery disease, status post CABG 2022. See #1 above. Continue aspirin, Plavix, Lopressor, and rosuvastatin.  An After Visit Summary was printed and given to the patient.  FOLLOW UP: No follow-ups on file.  Signed:  Santiago Bumpers, MD           07/05/2023

## 2023-07-05 NOTE — Telephone Encounter (Signed)
Pt c/o Shortness Of Breath: STAT if SOB developed within the last 24 hours or pt is noticeably SOB on the phone  1. Are you currently SOB (can you hear that pt is SOB on the phone)? No  2. How long have you been experiencing SOB? Since this past weekend   3. Are you SOB when sitting or when up moving around? Moving around   4. Are you currently experiencing any other symptoms? Low energy and fatigue

## 2023-07-05 NOTE — Telephone Encounter (Signed)
Spoke with Pts wife. Gave Dr Malachy Mood recommendations. Pts wife stated understanding.

## 2023-07-05 NOTE — Telephone Encounter (Signed)
Called to relay Dr Malachy Mood recommendations however after many rings it said the call could not be completed at this time.

## 2023-07-06 ENCOUNTER — Ambulatory Visit
Admission: RE | Admit: 2023-07-06 | Discharge: 2023-07-06 | Disposition: A | Discharge status: Home / Self Care | Payer: Medicare HMO | Source: Ambulatory Visit | Attending: Family Medicine | Admitting: Family Medicine

## 2023-07-06 DIAGNOSIS — R0609 Other forms of dyspnea: Secondary | ICD-10-CM

## 2023-07-06 DIAGNOSIS — R0602 Shortness of breath: Secondary | ICD-10-CM | POA: Diagnosis not present

## 2023-07-06 LAB — CBC WITH DIFFERENTIAL/PLATELET
Absolute Monocytes: 600 {cells}/uL (ref 200–950)
Basophils Absolute: 47 {cells}/uL (ref 0–200)
Basophils Relative: 0.6 %
Eosinophils Absolute: 332 {cells}/uL (ref 15–500)
Eosinophils Relative: 4.2 %
HCT: 44 % (ref 38.5–50.0)
Hemoglobin: 14.4 g/dL (ref 13.2–17.1)
Lymphs Abs: 2315 {cells}/uL (ref 850–3900)
MCH: 30.6 pg (ref 27.0–33.0)
MCHC: 32.7 g/dL (ref 32.0–36.0)
MCV: 93.4 fL (ref 80.0–100.0)
MPV: 11.3 fL (ref 7.5–12.5)
Monocytes Relative: 7.6 %
Neutro Abs: 4606 {cells}/uL (ref 1500–7800)
Neutrophils Relative %: 58.3 %
Platelets: 171 10*3/uL (ref 140–400)
RBC: 4.71 10*6/uL (ref 4.20–5.80)
RDW: 13.5 % (ref 11.0–15.0)
Total Lymphocyte: 29.3 %
WBC: 7.9 10*3/uL (ref 3.8–10.8)

## 2023-07-06 LAB — BASIC METABOLIC PANEL
BUN/Creatinine Ratio: 12 (calc) (ref 6–22)
BUN: 15 mg/dL (ref 7–25)
CO2: 27 mmol/L (ref 20–32)
Calcium: 9.5 mg/dL (ref 8.6–10.3)
Chloride: 106 mmol/L (ref 98–110)
Creat: 1.23 mg/dL — ABNORMAL HIGH (ref 0.70–1.22)
Glucose, Bld: 86 mg/dL (ref 65–99)
Potassium: 4.3 mmol/L (ref 3.5–5.3)
Sodium: 143 mmol/L (ref 135–146)

## 2023-07-06 LAB — T4, FREE: Free T4: 1 ng/dL (ref 0.8–1.8)

## 2023-07-06 LAB — TSH: TSH: 1.97 m[IU]/L (ref 0.40–4.50)

## 2023-07-06 LAB — T3: T3, Total: 87 ng/dL (ref 76–181)

## 2023-07-06 LAB — VITAMIN B12: Vitamin B-12: 387 pg/mL (ref 200–1100)

## 2023-07-14 DIAGNOSIS — H43812 Vitreous degeneration, left eye: Secondary | ICD-10-CM | POA: Diagnosis not present

## 2023-07-14 DIAGNOSIS — Z961 Presence of intraocular lens: Secondary | ICD-10-CM | POA: Diagnosis not present

## 2023-07-14 DIAGNOSIS — H35373 Puckering of macula, bilateral: Secondary | ICD-10-CM | POA: Diagnosis not present

## 2023-07-14 NOTE — Progress Notes (Unsigned)
Cardiology Office Note    Patient Name: Brandon Haynes Date of Encounter: 07/15/2023  Primary Care Provider:  Jeoffrey Massed, MD Primary Cardiologist:  Armanda Magic, MD Primary Electrophysiologist: None   Past Medical History    Past Medical History:  Diagnosis Date   Allergic rhinitis    allergy shots via [place]   Anxiety    Bladder cancer (HCC) 05/2019   pTisN0Mx.  Lesion resected. No mets.  He got neoadjuvant chemo x 4 cycles.  Cystoprostectomy with ileal conduit formation 12/13/19 (Dr. Berneice Heinrich). 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 difficile colitis 01/09/2020; late May 2021   oral vancomycin   CAD (coronary artery disease)    mid LAD stent 2016; CABG 04/2021   CAD, multiple vessel    a. CP/near-syncope 11/2014 s/p DES to MLAD, occluded RCA after takeoff of RV marginal with collaterals treated medically, otherwise mild nonobstructive disease. EF 55%;  b. 07/2015 Lexiscan CL: EF 56%, no ischemia/infarct.    Cataracts, both eyes    Chronic constipation    worse since ileal conduit surgery was done 2020   Chronic low back pain    DDD (degenerative disc disease), lumbar    GERD (gastroesophageal reflux disease)    PPI bid needed to control sx's   Gout    Hearing loss    History  of basal cell carcinoma    History of adenomatous polyp of colon    History of prostate cancer 11/2019   grd 2 cancer with neg margins incidental on cystectomy path 11/2019.   Hypercholesteremia    Hypertension    Hx labile/uncontrolled.  Improved s/p visit to HTN clinic 2019.   Iron deficiency anemia 2017   Colonoscopy, EGD, and capsule endoscopy w/out obvious cause/source found.  Oral iron helping as of 03/2016.   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 No avid enhancement  or mass effect. Obs by urol as of 10/2019 and 11/2020.    History of Present Illness  Brandon Haynes is a 82 y.o. male with a PMH of CAD s/p PTCA and stenting to 99% LAD with RCA occlusion and collateralization in 2016, CABG x 2 on 05/06/2021 HTN, HLD, GERD basal cell carcinoma who presents today for dyspnea.  Brandon Haynes was seen initially in 04/2015 was admitted to the ED with recurrent chest pain and underwent LHC that showed mid LAD heavily calcified LAD, patent mid LAD stent with minimal stenosis with unsuccessful PCI and underwent CABG x2 on 05/06/2021. This was complicated by postop thrombocytopenia and hypotension requiring midodrine.  He was last seen by Dr. Mayford Knife on 10/07/2022 and reported doing well at that time.  His blood pressures were well-controlled and was tolerating medications without any reactions.  He contacted our office on 07/05/2023 with complaint of shortness of breath.  He reported symptoms improved with rest and sometimes are associated with dizziness.  He was recommended to follow-up with his PCP normal thyroid.  He was seen by his PCP on 07/05/23 and reported feeling really dizzy and tired with shortness of breath.  EKG was completed and showed no acute abnormalities and lab work were checked with no abnormalities noted.  During today's visit the patient reports that he has been experiencing episodes of dizziness and tiredness especially  with activity.  He reports recently walking 12-15 steps and was dizzy and felt worn out with some chest pressure once he arrived to the top of the stairs.  He denies any syncope or palpitations with his chest pressure.  His blood pressure today was 140/88 and was.  Reduced to 136/72 on recheck.  He reports compliance with his current medication regimen and denies any adverse reactions.  He reports having COVID 4 weeks ago and felt poorly and lost a total of 10 pounds.  He also notes that he felt fatigued prior to having COVID.  He recently went to the beach  with his family and ate more salt than normal and has noticed increased urination and his weight is currently up 5 to 6 pounds.  He has trace lower extremity edema bilaterally and is otherwise euvolemic on examination.  Patient denies chest pain, palpitations, dyspnea, PND, orthopnea, nausea, vomiting, dizziness, syncope, edema, weight gain, or early satiety.   Review of Systems  Please see the history of present illness.    All other systems reviewed and are otherwise negative except as noted above.  Physical Exam    Wt Readings from Last 3 Encounters:  07/15/23 213 lb 3.2 oz (96.7 kg)  07/05/23 208 lb 3.2 oz (94.4 kg)  03/04/23 210 lb 3.2 oz (95.3 kg)   VS: Vitals:   07/15/23 1100  BP: (!) 140/88  Pulse: (!) 55  Resp: 16  SpO2: 98%  ,Body mass index is 26.65 kg/m. GEN: Well nourished, well developed in no acute distress Neck: No JVD; No carotid bruits Pulmonary: Clear to auscultation without rales, wheezing or rhonchi  Cardiovascular: Normal rate. Regular rhythm. Normal S1. Normal S2.   Murmurs: There is no murmur.  ABDOMEN: Soft, non-tender, non-distended EXTREMITIES: Trace lower extremity edema no deformity   EKG/LABS/ Recent Cardiac Studies   ECG personally reviewed by me today -none completed today  Risk Assessment/Calculations:          Lab Results  Component Value Date   WBC 7.9 07/05/2023   HGB 14.4 07/05/2023   HCT 44.0 07/05/2023   MCV 93.4 07/05/2023   PLT 171 07/05/2023   Lab Results  Component Value Date   CREATININE 1.23 (H) 07/05/2023   BUN 15 07/05/2023   NA 143 07/05/2023   K 4.3 07/05/2023   CL 106 07/05/2023   CO2 27 07/05/2023   Lab Results  Component Value Date   CHOL 133 03/04/2023   HDL 64 03/04/2023   LDLCALC 51 03/04/2023   TRIG 92 03/04/2023   CHOLHDL 2.1 03/04/2023    Lab Results  Component Value Date   HGBA1C 5.7 (H) 05/06/2021   Assessment & Plan    1.  Coronary artery disease: -s/p PCI to LAD and LHC repeated in 2022  for complaint of chest pain revealed multivessel CAD treated with CABG x 2 on 05/06/2021 -Patient recently contacted the office with complaint of shortness of breath and fatigue -Today patient denies any chest pressure but did experience some while taking the stairs a few weeks ago. -We will complete Lexiscan Myoview to rule out possible ischemia -We will also complete 2D echo to evaluate for structural heart or valvular changes. -Continue ASA 81 mg, Plavix 75 mg, milligrams twice daily, Crestor 40 mg daily   2.  Essential hypertension: -Patient's blood pressure today was initially 140/88 and was 136/72 on recheck -Continue metoprolol to tartrate 12.5 mg twice daily -Patient will check BP over the next few weeks and  if elevated will increase metoprolol or add additional agent.  3.  Hyperlipidemia: -Patient's last LDL cholesterol was 51 -Continue Crestor 40 mg daily  4.  Shortness of breath: -Patient reports ongoing shortness of breath with fatigue and has noticed 5 to 6 pound weight gain long with increasing urination. -Patient will take Lasix 20 mg x 3 days along with 10 mEq of potassium and then 20 mg as needed -Low sodium diet, fluid restriction <2L, and daily weights encouraged. Educated to contact our office for weight gain of 2 lbs overnight or 5 lbs in one week.    -BNP today and BMET  Disposition: Follow-up with Armanda Magic, MD or APP in 3 months Informed Consent   Shared Decision Making/Informed Consent The risks [chest pain, shortness of breath, cardiac arrhythmias, dizziness, blood pressure fluctuations, myocardial infarction, stroke/transient ischemic attack, nausea, vomiting, allergic reaction, radiation exposure, metallic taste sensation and life-threatening complications (estimated to be 1 in 10,000)], benefits (risk stratification, diagnosing coronary artery disease, treatment guidance) and alternatives of a nuclear stress test were discussed in detail with Mr. Im and he  agrees to proceed.      Signed, Napoleon Form, Leodis Rains, NP 07/15/2023, 11:44 AM [facility]

## 2023-07-15 ENCOUNTER — Ambulatory Visit: Payer: Medicare HMO | Attending: Nurse Practitioner | Admitting: Nurse Practitioner

## 2023-07-15 ENCOUNTER — Encounter: Payer: Self-pay | Admitting: Nurse Practitioner

## 2023-07-15 VITALS — BP 140/88 | HR 55 | Resp 16 | Ht 75.0 in | Wt 213.2 lb

## 2023-07-15 DIAGNOSIS — R0602 Shortness of breath: Secondary | ICD-10-CM

## 2023-07-15 DIAGNOSIS — I10 Essential (primary) hypertension: Secondary | ICD-10-CM

## 2023-07-15 DIAGNOSIS — I25118 Atherosclerotic heart disease of native coronary artery with other forms of angina pectoris: Secondary | ICD-10-CM

## 2023-07-15 DIAGNOSIS — E7800 Pure hypercholesterolemia, unspecified: Secondary | ICD-10-CM | POA: Diagnosis not present

## 2023-07-15 MED ORDER — POTASSIUM CHLORIDE ER 10 MEQ PO TBCR: 10.0000 meq | EXTENDED_RELEASE_TABLET | Freq: Every day | ORAL | 0 refills | Status: DC | PRN

## 2023-07-15 MED ORDER — FUROSEMIDE 20 MG PO TABS: 20.0000 mg | ORAL_TABLET | Freq: Every day | ORAL | 0 refills | Status: DC | PRN

## 2023-07-15 NOTE — Patient Instructions (Signed)
Medication Instructions:  TAKE Lasix 20mg  Daily for 3 days then take as needed TAKE Potassium daily for 3 days then take as needed( TAKE POTASSIUM ON DAYS YOU TAKE LASIX) *If you need a refill on your cardiac medications before your next appointment, please call your pharmacy*   Lab Work: TODAY-BMET & BNP 1 WEEK REPEAT BMET If you have labs (blood work) drawn today and your tests are completely normal, you will receive your results only by: MyChart Message (if you have MyChart) OR A paper copy in the mail If you have any lab test that is abnormal or we need to change your treatment, we will call you to review the results.   Testing/Procedures: Your physician has requested that you have an echocardiogram. Echocardiography is a painless test that uses sound waves to create images of your heart. It provides your doctor with information about the size and shape of your heart and how well your heart's chambers and valves are working. This procedure takes approximately one hour. There are no restrictions for this procedure. Please do NOT wear cologne, perfume, aftershave, or lotions (deodorant is allowed). Please arrive 15 minutes prior to your appointment time.Q  Your physician has requested that you have a lexiscan myoview. For further information please visit [email or web address] Please follow instruction sheet, as given.   Follow-Up: At [facility], you and your health needs are our priority.  As part of our continuing mission to provide you with exceptional heart care, we have created designated Provider Care Teams.  These Care Teams include your primary Cardiologist (physician) and Advanced Practice Providers (APPs -  Physician Assistants and Nurse Practitioners) who all work together to provide you with the care you need, when you need it.  We recommend signing up for the patient portal called "MyChart".  Sign up information is provided on this After Visit Summary.  MyChart is  used to connect with patients for Virtual Visits (Telemedicine).  Patients are able to view lab/test results, encounter notes, upcoming appointments, etc.  Non-urgent messages can be sent to your provider as well.   To learn more about what you can do with MyChart, go to [email or web address].    Your next appointment:   3 month(s)  Provider:   Armanda Magic, MD  or Robin Searing, NP   Other Instructions  Check your blood pressure daily for 2 weeks, then contact the office with your readings.  Make sure to check 2 hours after your medications.   AVOID these things for 30 minutes before checking your blood pressure: No Drinking caffeine. No Drinking alcohol. No Eating. No Smoking. No Exercising.  Five minutes before checking your blood pressure: Pee. Sit in a dining chair. Avoid sitting in a soft couch or armchair. Be quiet. Do not talk.

## 2023-07-21 DIAGNOSIS — C44622 Squamous cell carcinoma of skin of right upper limb, including shoulder: Secondary | ICD-10-CM | POA: Diagnosis not present

## 2023-07-22 DIAGNOSIS — R0602 Shortness of breath: Secondary | ICD-10-CM | POA: Diagnosis not present

## 2023-07-22 DIAGNOSIS — I25118 Atherosclerotic heart disease of native coronary artery with other forms of angina pectoris: Secondary | ICD-10-CM | POA: Diagnosis not present

## 2023-07-22 DIAGNOSIS — E7800 Pure hypercholesterolemia, unspecified: Secondary | ICD-10-CM | POA: Diagnosis not present

## 2023-07-22 DIAGNOSIS — I10 Essential (primary) hypertension: Secondary | ICD-10-CM | POA: Diagnosis not present

## 2023-07-23 LAB — BASIC METABOLIC PANEL
BUN/Creatinine Ratio: 14 (ref 10–24)
BUN: 16 mg/dL (ref 8–27)
CO2: 22 mmol/L (ref 20–29)
Calcium: 9.3 mg/dL (ref 8.6–10.2)
Chloride: 105 mmol/L (ref 96–106)
Creatinine, Ser: 1.14 mg/dL (ref 0.76–1.27)
Glucose: 103 mg/dL — ABNORMAL HIGH (ref 70–99)
Potassium: 4.2 mmol/L (ref 3.5–5.2)
Sodium: 143 mmol/L (ref 134–144)
eGFR: 65 mL/min/{1.73_m2} (ref >59)

## 2023-07-23 LAB — PRO B NATRIURETIC PEPTIDE: NT-Pro BNP: 149 pg/mL (ref 0–486)

## 2023-07-26 ENCOUNTER — Other Ambulatory Visit: Payer: Self-pay | Admitting: Nurse Practitioner

## 2023-07-28 ENCOUNTER — Telehealth: Payer: Self-pay

## 2023-07-28 NOTE — Telephone Encounter (Signed)
Retrieved patient voicemail from today at 3:40PM. Patient returning call regarding imaging results.  Please call patient at [phone]

## 2023-07-29 NOTE — Telephone Encounter (Signed)
Spoke with pt's wife regarding results.

## 2023-07-29 NOTE — Progress Notes (Signed)
Pt notified of results

## 2023-08-04 DIAGNOSIS — C44629 Squamous cell carcinoma of skin of left upper limb, including shoulder: Secondary | ICD-10-CM | POA: Diagnosis not present

## 2023-08-05 ENCOUNTER — Telehealth: Payer: Self-pay | Admitting: Cardiology

## 2023-08-05 NOTE — Telephone Encounter (Signed)
Patient is calling inform Dr. Mayford Knife that he completed the chest xray and everything came back normal.

## 2023-08-10 ENCOUNTER — Other Ambulatory Visit: Payer: Self-pay | Admitting: Nurse Practitioner

## 2023-08-11 ENCOUNTER — Telehealth (HOSPITAL_COMMUNITY): Payer: Self-pay | Admitting: *Deleted

## 2023-08-11 NOTE — Telephone Encounter (Signed)
Patient given detailed instructions per Myocardial Perfusion Study Information Sheet for the test on 08/18/23 Patient notified to arrive 15 minutes early and that it is imperative to arrive on time for appointment to keep from having the test rescheduled.  If you need to cancel or reschedule your appointment, please call the office within 24 hours of your appointment. . Patient verbalized understanding.[name]

## 2023-08-11 NOTE — Telephone Encounter (Deleted)
Patient given detailed instructions per Myocardial Perfusion Study Information Sheet for the test on 08/18/23 Patient notified to arrive 15 minutes early and that it is imperative to arrive on time for appointment to keep from having the test rescheduled.  If you need to cancel or reschedule your appointment, please call the office within 24 hours of your appointment. . Patient verbalized understanding.[name]

## 2023-08-18 ENCOUNTER — Ambulatory Visit (HOSPITAL_BASED_OUTPATIENT_CLINIC_OR_DEPARTMENT_OTHER): Payer: Medicare HMO

## 2023-08-18 ENCOUNTER — Ambulatory Visit (HOSPITAL_COMMUNITY): Payer: Medicare HMO | Attending: Cardiology

## 2023-08-18 DIAGNOSIS — I10 Essential (primary) hypertension: Secondary | ICD-10-CM

## 2023-08-18 DIAGNOSIS — I25118 Atherosclerotic heart disease of native coronary artery with other forms of angina pectoris: Secondary | ICD-10-CM | POA: Insufficient documentation

## 2023-08-18 DIAGNOSIS — E7800 Pure hypercholesterolemia, unspecified: Secondary | ICD-10-CM | POA: Diagnosis not present

## 2023-08-18 DIAGNOSIS — R0602 Shortness of breath: Secondary | ICD-10-CM | POA: Insufficient documentation

## 2023-08-18 LAB — MYOCARDIAL PERFUSION IMAGING
LV dias vol: 83 mL (ref 62–150)
LV sys vol: 24 mL
Nuc Stress EF: 71 %
Peak HR: 88 {beats}/min
Rest HR: 71 {beats}/min
Rest Nuclear Isotope Dose: 10.4 mCi
SDS: 0
SRS: 0
SSS: 0
ST Depression (mm): 0 mm
Stress Nuclear Isotope Dose: 31.5 mCi
TID: 1.01

## 2023-08-18 LAB — ECHOCARDIOGRAM COMPLETE
Area-P 1/2: 2.28 cm2
Height: 75 in
P 1/2 time: 655 ms
S' Lateral: 2.4 cm
Weight: 3408 [oz_av]

## 2023-08-18 MED ORDER — TECHNETIUM TC 99M TETROFOSMIN IV KIT
31.5000 | PACK | Freq: Once | INTRAVENOUS | Status: AC | PRN
  Administered 2023-08-18: 31.5 via INTRAVENOUS

## 2023-08-18 MED ORDER — TECHNETIUM TC 99M TETROFOSMIN IV KIT
10.4000 | PACK | Freq: Once | INTRAVENOUS | Status: AC | PRN
  Administered 2023-08-18: 10.4 via INTRAVENOUS

## 2023-08-18 MED ORDER — REGADENOSON 0.4 MG/5ML IV SOLN
0.4000 mg | Freq: Once | INTRAVENOUS | Status: AC
  Administered 2023-08-18: 0.4 mg via INTRAVENOUS

## 2023-08-24 ENCOUNTER — Other Ambulatory Visit: Payer: Self-pay | Admitting: Nurse Practitioner

## 2023-08-25 ENCOUNTER — Telehealth: Payer: Self-pay | Admitting: Nurse Practitioner

## 2023-08-25 ENCOUNTER — Other Ambulatory Visit: Payer: Self-pay

## 2023-08-25 DIAGNOSIS — Z936 Other artificial openings of urinary tract status: Secondary | ICD-10-CM | POA: Diagnosis not present

## 2023-08-25 DIAGNOSIS — Z8551 Personal history of malignant neoplasm of bladder: Secondary | ICD-10-CM | POA: Diagnosis not present

## 2023-08-25 MED ORDER — PANTOPRAZOLE SODIUM 40 MG PO TBEC: 40.0000 mg | DELAYED_RELEASE_TABLET | Freq: Every day | ORAL | 3 refills | Status: DC

## 2023-08-25 NOTE — Telephone Encounter (Signed)
Requesting medication refill fro Protonix sent into pharmacy at [facility] [place] [address] [place].   Patient scheduled with PA early 2025. Please advise.   Thank you

## 2023-08-25 NOTE — Telephone Encounter (Addendum)
Patient last seen in office 09/2021 for dysphagia. EGD with dilation 10/13/2021. Appointment for follow up in February 2025. Protonix refilled to cover until his appointment. Patient contacted and advised of refills. He reports no problems and states "seem to be doing fine."

## 2023-09-03 ENCOUNTER — Ambulatory Visit (INDEPENDENT_AMBULATORY_CARE_PROVIDER_SITE_OTHER): Payer: Medicare HMO | Admitting: Family Medicine

## 2023-09-03 ENCOUNTER — Encounter: Payer: Self-pay | Admitting: Family Medicine

## 2023-09-03 VITALS — BP 130/79 | HR 60 | Wt 215.6 lb

## 2023-09-03 DIAGNOSIS — E7800 Pure hypercholesterolemia, unspecified: Secondary | ICD-10-CM | POA: Diagnosis not present

## 2023-09-03 DIAGNOSIS — M5441 Lumbago with sciatica, right side: Secondary | ICD-10-CM | POA: Diagnosis not present

## 2023-09-03 DIAGNOSIS — R5382 Chronic fatigue, unspecified: Secondary | ICD-10-CM | POA: Diagnosis not present

## 2023-09-03 DIAGNOSIS — G894 Chronic pain syndrome: Secondary | ICD-10-CM

## 2023-09-03 DIAGNOSIS — I10 Essential (primary) hypertension: Secondary | ICD-10-CM | POA: Diagnosis not present

## 2023-09-03 DIAGNOSIS — G8929 Other chronic pain: Secondary | ICD-10-CM

## 2023-09-03 DIAGNOSIS — Z79899 Other long term (current) drug therapy: Secondary | ICD-10-CM | POA: Diagnosis not present

## 2023-09-03 NOTE — Progress Notes (Signed)
OFFICE VISIT  09/03/2023  CC:  Chief Complaint  Patient presents with   Medical Management of Chronic Issues    Pt is fasting.     Patient is a 82 y.o. male who presents for 45-month follow-up hypertension, hyperlipidemia, and chronic pain syndrome. A/P as of last visit: "1  hypertension, well-controlled on Lopressor 12.5 mg twice daily.   2 Hypercholesterolemia, doing well on rosuvastatin 40 mg a day.   #3 chronic pain syndrome.  Bilateral low back pain with intermittent bilateral sciatica. Well-controlled using tramadol 50 mg 1-3 times a day and gabapentin 300 mg 3 times daily. Continue this--> a new prescription was not needed today.. Controlled substance contract and urine drug screen are up-to-date."  INTERIM HX: He is feeling improved since I last saw him.  Still with some chronic fatigue but not as bad. No chest pain.  He has no shortness of breath on flat surfaces.  He really did not go up inclines any.  Home blood pressures typically in the 120s over 70s. Occasionally drops down around 100/60 but this is rare.   Chronic pain: Lumbar degenerative disc disease with intermittent right-sided sciatica.  Has received epidural steroid injections in the past and these have been somewhat helpful.  I have prescribed him tramadol and small amounts long-term and he has done well with this. States pain is well controlled and his medication allows him to be functional. PMP AWARE reviewed today: most recent rx for tramadol was filled 08/31/2023, # 90, rx by me. No red flags.  ROS as above, plus--> no fevers, no CP, no SOB, no wheezing, no cough, no dizziness, no HAs, no rashes, no melena/hematochezia.  No polyuria or polydipsia.  No myalgias or arthralgias.  No focal weakness, paresthesias, or tremors.  No acute vision or hearing abnormalities.  No dysuria or unusual/new urinary urgency or frequency.  No recent changes in lower legs. No n/v/d or abd pain.  No palpitations.     Past  Medical History:  Diagnosis Date   Allergic rhinitis    allergy shots via [place]   Anxiety    Bladder cancer (HCC) 05/2019   pTisN0Mx.  Lesion resected. No mets.  He got neoadjuvant chemo x 4 cycles.  Cystoprostectomy with ileal conduit formation 12/13/19 (Dr. Berneice Heinrich). 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 difficile colitis 01/09/2020; late May 2021   oral vancomycin   CAD (coronary artery disease)    mid LAD stent 2016; CABG 04/2021   CAD, multiple vessel    a. CP/near-syncope 11/2014 s/p DES to MLAD, occluded RCA after takeoff of RV marginal with collaterals treated medically, otherwise mild nonobstructive disease. EF 55%;  b. 07/2015 Lexiscan CL: EF 56%, no ischemia/infarct.    Cataracts, both eyes    Chronic constipation    worse since ileal conduit surgery was done 2020   Chronic low back pain    DDD (degenerative disc disease), lumbar    GERD (gastroesophageal reflux disease)    PPI bid needed to control sx's   Gout    Hearing loss    History  of basal cell carcinoma    History of adenomatous polyp of colon    History of prostate cancer 11/2019   grd 2 cancer with neg margins incidental on cystectomy path 11/2019.   Hypercholesteremia    Hypertension    Hx labile/uncontrolled.  Improved s/p visit to HTN clinic 2019.   Iron deficiency anemia 2017   Colonoscopy,  EGD, and capsule endoscopy w/out obvious cause/source found.  Oral iron helping as of 03/2016.   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 No avid enhancement or mass effect. Obs by urol as of 10/2019 and 11/2020.    Past Surgical History:  Procedure Laterality Date   back injection  oct 2011, 07/2010, 12/2012   no surgery   Capsule endoscopy  02/2016   mild duodenitis, o/w NEG   CARDIOVASCULAR  STRESS TEST  05/2009; 07/2015   07/2023 low risk lexiscan   carotid dopplers  11/2014   Bilateral - 1% to 9% ICA stenosis. Vertebral artery flow is   CATARACT EXTRACTION, BILATERAL     Jan, Feb 2022   COLONOSCOPY  08/2008; 02/2016; 02/2017; 05/27/20   2017: 12 polyps (adenomatous).  02/2017 repeat TCS--multiple polyps. 04/2020 +adenomas.   CORONARY ANGIOPLASTY WITH STENT PLACEMENT  12/04/2014   4.0 x 12 mm Synergy stent to the mid LAD   CORONARY ARTERY BYPASS GRAFT N/A 05/06/2021   Procedure: OFF PUMP CORONARY ARTERY BYPASS GRAFTING (CABG) X TWO, USING LEFT INTERNAL MAMMARY ARTERY AND RIGHT LEG GREATER SAPHENOUS VEIN HARVESTED ENDOSCOPICALLY;  Surgeon: Corliss Skains, MD;  Location: [facility] OR;  Service: Open Heart Surgery;  Laterality: N/A;   CYSTOSCOPY  05/24/2019   CYSTOSCOPY W/ TRANSURETHRAL RESECTION OF POSTERIOR URETHERAL VALVES  06/12/2019   2-5cm   CYSTOSCOPY WITH INJECTION N/A 12/13/2019   Procedure: CYSTOSCOPY WITH INJECTION;  Surgeon: Sebastian Ache, MD;  Location: [facility];  Service: Urology;  Laterality: N/A;   ESOPHAGOGASTRODUODENOSCOPY  02/2016   Gastritis.  NEG h pylori.     IR IMAGING GUIDED PORT INSERTION  09/12/2019   IR REMOVAL TUN ACCESS W/ PORT W/O FL MOD SED  08/20/2020   LE arterial vascular study  02/2016   Per Dr. Mayford Knife, no evidence of peripheral vascular disease   LEFT HEART CATH AND CORONARY ANGIOGRAPHY N/A 05/01/2021   Procedure: LEFT HEART CATH AND CORONARY ANGIOGRAPHY;  Surgeon: Kathleene Hazel, MD;  Location: [facility];  Service: Cardiovascular;  Laterality: N/A;   LEFT HEART CATHETERIZATION WITH CORONARY ANGIOGRAM N/A 12/04/2014   Procedure: LEFT HEART CATHETERIZATION WITH CORONARY ANGIOGRAM;  Surgeon: Dolores Patty, MD; LAD 99% then aneurysmal, OM1 40%, CFX 50%, RCA 40%, 50% then occluded   LYMPHADENECTOMY Bilateral 12/13/2019   Procedure: LYMPHADENECTOMY;  Surgeon: Sebastian Ache, MD;  Location: [facility];  Service: Urology;  Laterality:  Bilateral;   ROBOT ASSISTED LAPAROSCOPIC COMPLETE CYSTECT ILEAL CONDUIT N/A 12/13/2019   Procedure: XI ROBOTIC ASSISTED LAPAROSCOPIC COMPLETE CYSTECT ILEAL CONDUIT AND RADICAL PROSTATECTOMY;  Surgeon: Sebastian Ache, MD;  Location: [facility];  Service: Urology;  Laterality: N/A;  6 HRS   ROTATOR CUFF REPAIR Left 09/2013   left   SHOULDER INJECTION  10/2012   TEE WITHOUT CARDIOVERSION N/A 05/06/2021   Procedure: TRANSESOPHAGEAL ECHOCARDIOGRAM (TEE);  Surgeon: Corliss Skains, MD;  [facility];  Service: Open Heart Surgery;  Laterality: N/A;   TRANSTHORACIC ECHOCARDIOGRAM  11/2014   EF 50-55%, some wall motion abnormalities noted, grade I diast dysfxn   TRANSTHORACIC ECHOCARDIOGRAM  05/01/2021   04/2021 pre CABG, EF 55-50%, nl wm, grd I DD, valves fine.  07/2023 grd I DD, o/w good   TRANSURETHRAL RESECTION OF BLADDER TUMOR N/A 06/12/2019   INVASIVE HIGH GRADE PAPILLARY UROTHELIAL  CARCINOMA, invading muscularis propria.  Procedure: TRANSURETHRAL RESECTION OF BLADDER TUMOR (TURBT);  Surgeon: Crista Elliot, MD;  Location: [facility];  Service: Urology;  Laterality: N/A;    Outpatient Medications Prior to Visit  Medication Sig Dispense Refill   acetaminophen (TYLENOL) 650 MG CR tablet Take 650 mg by mouth every 8 (eight) hours as needed for pain.     allopurinol (ZYLOPRIM) 300 MG tablet TAKE 1 TABLET BY MOUTH EVERY DAY 90 tablet 3   Ascorbic Acid (VITAMIN C PO) Take by mouth daily.     aspirin EC 81 MG tablet Take 81 mg by mouth daily.     Cholecalciferol (D3 PO) Take 1 capsule by mouth daily.     clopidogrel (PLAVIX) 75 MG tablet TAKE 1 TABLET BY MOUTH EVERY DAY 90 tablet 3   desonide (DESOWEN) 0.05 % cream APPLY TO AFFECTED AREA EVERY DAY 60 g 2   hydrocortisone cream 1 % Apply 1 application  topically 4 (four) times daily as needed for itching.     loratadine (CLARITIN) 10 MG tablet Take 20 mg by mouth daily.     metoprolol tartrate (LOPRESSOR) 25 MG tablet TAKE 0.5 TABLETS BY MOUTH 2  TIMES DAILY. 90 tablet 3   pantoprazole (PROTONIX) 40 MG tablet Take 1 tablet (40 mg total) by mouth daily before breakfast. 30 tablet 3   PREVIDENT 5000 BOOSTER PLUS 1.1 % PSTE Take 1 application by mouth as directed.  1   rosuvastatin (CRESTOR) 40 MG tablet TAKE 1 TABLET BY MOUTH EVERY DAY 90 tablet 3   traMADol (ULTRAM) 50 MG tablet TAKE 1 TABLET BY MOUTH THREE TIMES A DAY AS NEEDED FOR PAIN 90 tablet 5   triamcinolone (NASACORT) 55 MCG/ACT AERO nasal inhaler Place 1 spray into the nose 2 (two) times daily as needed (allergies).     EPINEPHrine 0.3 mg/0.3 mL IJ SOAJ injection Inject 0.3 mg into the muscle as needed for anaphylaxis. (Patient not taking: Reported on 09/03/2023)     furosemide (LASIX) 20 MG tablet TAKE 1 TABLET BY MOUTH EVERY DAY AS NEEDED (Patient not taking: Reported on 09/03/2023) 90 tablet 0   potassium chloride (KLOR-CON) 10 MEQ tablet TAKE 1 TABLET (10 MEQ TOTAL) BY MOUTH DAILY AS NEEDED (TAKE WITH LASIX). (Patient not taking: Reported on 09/03/2023) 90 tablet 3   No facility-administered medications prior to visit.    No Known Allergies  Review of Systems As per HPI  PE:    09/03/2023    9:37 AM 08/18/2023   10:03 AM 07/15/2023   11:00 AM  Vitals with BMI  Height  6\' 3"  6\' 3"   Weight 215 lbs 10 oz 213 lbs 213 lbs 3 oz  BMI  26.62 26.65  Systolic 130  140  Diastolic 79  88  Pulse 60  55     Physical Exam  Gen: Alert, well appearing.  Patient is oriented to person, place, time, and situation. AFFECT: pleasant, lucid thought and speech. CV: RRR, no m/r/g.   LUNGS: CTA bilat, nonlabored resps, good aeration in all lung fields. EXT: no clubbing or cyanosis.  no edema.    LABS:  Last CBC Lab Results  Component Value Date   WBC 7.9 07/05/2023   HGB 14.4 07/05/2023   HCT 44.0 07/05/2023   MCV 93.4 07/05/2023   MCH 30.6 07/05/2023   RDW 13.5 07/05/2023   PLT 171 07/05/2023   Last metabolic panel Lab Results  Component Value Date   GLUCOSE 103 (H)  07/22/2023   NA 143 07/22/2023   K 4.2 07/22/2023   CL 105 07/22/2023   CO2 22 07/22/2023   BUN 16 07/22/2023   CREATININE 1.14 07/22/2023   EGFR 65 07/22/2023   CALCIUM 9.3 07/22/2023   PHOS 4.0 02/21/2020   PROT 6.7 03/04/2023   ALBUMIN 4.4 08/11/2021   BILITOT 0.6 03/04/2023   ALKPHOS 53 08/11/2021   AST 18 03/04/2023   ALT 12 03/04/2023   ANIONGAP 8 05/09/2021   Last lipids Lab Results  Component Value Date   CHOL 133 03/04/2023   HDL 64 03/04/2023   LDLCALC 51 03/04/2023   TRIG 92 03/04/2023   CHOLHDL 2.1 03/04/2023   Last hemoglobin A1c Lab Results  Component Value Date   HGBA1C 5.7 (H) 05/06/2021   Last thyroid functions Lab Results  Component Value Date   TSH 1.97 07/05/2023   T3TOTAL 87 07/05/2023   Last vitamin B12 and Folate Lab Results  Component Value Date   VITAMINB12 387 07/05/2023   FOLATE 17.4 02/10/2016   IMPRESSION AND PLAN:  1  hypertension, well-controlled on Lopressor 12.5 mg twice daily. Electrolytes and creatinine normal about 6 weeks ago.  2 Hypercholesterolemia, doing well on rosuvastatin 40 mg a day. LDL was 51 approximately 6 months ago. Lipid panel today.  3 chronic pain syndrome.  Bilateral low back pain with intermittent bilateral sciatica. Well-controlled using tramadol 50 mg 1-3 times a day and gabapentin 300 mg 3 times daily. Continue this--> a new prescription was not needed today.. Controlled substance contract and urine drug screen are up-to-date.  #4 fatigue. Lab workup unrevealing. Echo with diastolic dysfunction, otherwise good. Stress test low risk. This is getting better gradually. No new workup at this time.  An After Visit Summary was printed and given to the patient.  FOLLOW UP: No follow-ups on file.  Signed:  Santiago Bumpers, MD           09/03/2023

## 2023-09-04 LAB — LIPID PANEL
Cholesterol: 121 mg/dL (ref <200)
HDL: 60 mg/dL (ref >=40)
LDL Cholesterol (Calc): 41 mg/dL
Non-HDL Cholesterol (Calc): 61 mg/dL (ref <130)
Total CHOL/HDL Ratio: 2 (calc) (ref <5.0)
Triglycerides: 110 mg/dL (ref <150)

## 2023-10-01 DIAGNOSIS — Z8551 Personal history of malignant neoplasm of bladder: Secondary | ICD-10-CM | POA: Diagnosis not present

## 2023-10-01 DIAGNOSIS — Z936 Other artificial openings of urinary tract status: Secondary | ICD-10-CM | POA: Diagnosis not present

## 2023-10-04 ENCOUNTER — Encounter: Payer: Self-pay | Admitting: Cardiology

## 2023-10-04 ENCOUNTER — Ambulatory Visit: Payer: Medicare HMO | Attending: Cardiology | Admitting: Cardiology

## 2023-10-04 VITALS — BP 110/70 | HR 56 | Ht 75.0 in | Wt 219.0 lb

## 2023-10-04 DIAGNOSIS — E7800 Pure hypercholesterolemia, unspecified: Secondary | ICD-10-CM

## 2023-10-04 DIAGNOSIS — I25118 Atherosclerotic heart disease of native coronary artery with other forms of angina pectoris: Secondary | ICD-10-CM

## 2023-10-04 DIAGNOSIS — I10 Essential (primary) hypertension: Secondary | ICD-10-CM | POA: Diagnosis not present

## 2023-10-04 NOTE — Progress Notes (Signed)
 Date:  10/04/2023   ID:  Brandon Haynes, DOB 09-12-1941, MRN [id number] The patient was identified using 2 identifiers.  PCP:  Candise Aleene DEL, MD   [facility] Providers Cardiologist:  Wilbert Bihari, MD     Evaluation Performed:  Follow-Up Visit  Chief Complaint:  CAD, HTN, HLD  History of Present Illness:    Brandon Haynes is a 83 y.o. 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 The RCA was felt well collateralized and he underwent PCI of the LAD.  LV function was 55% at time of cath.  Echocardiogram revealed akinesis of the basal-mid inferoseptal myocardium and  mid-apical anteroseptal myocardium with EF 50-55%.    He subsequently developed recurrent chest pain in August 2022 with cath showing heavily calcified LAD, patent mid LAD stent with minimal stenosis with unsuccessful PCI and underwent CABG x2 on 05/06/2021.  This was complicated by postop thrombocytopenia and hypotension requiring midodrine .  He was seen back in the office postop and was doing well with except for an occasional dizzy spell with changes in position.  He was seen by Jackee Alberts, NP earlier this year and complained of 1 episode of chest discomfort when going upstairs.  Nuclear stress test showed no evidence of ischemia and EF 71%  He is here today for followup and is doing well.  He denies any chest pain or pressure, SOB, DOE, PND, orthopnea, LE edema, palpitations or syncope. He has had some dizziness when he stands up too fast. He is compliant with his meds and is tolerating meds with no SE.    Past Medical History:  Diagnosis Date   Allergic rhinitis    allergy shots via [place]   Anxiety    Bladder cancer (HCC) 05/2019   pTisN0Mx.  Lesion resected. No mets.  He got neoadjuvant chemo x 4 cycles.  Cystoprostectomy with ileal conduit formation 12/13/19 (Dr. Alvaro). 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 difficile colitis  01/09/2020; late May 2021   oral vancomycin    CAD (coronary artery disease)    mid LAD stent 2016; CABG 04/2021   CAD, multiple vessel    a. CP/near-syncope 11/2014 s/p DES to MLAD, occluded RCA after takeoff of RV marginal with collaterals treated medically, otherwise mild nonobstructive disease. EF 55%;  b. 07/2015 Lexiscan  CL: EF 56%, no ischemia/infarct.    Cataracts, both eyes    Chronic constipation    worse since ileal conduit surgery was done 2020   Chronic low back pain    DDD (degenerative disc disease), lumbar    GERD (gastroesophageal reflux disease)    PPI bid needed to control sx's   Gout    Hearing loss    History  of basal cell carcinoma    History of adenomatous polyp of colon    History of prostate cancer 11/2019   grd 2 cancer with neg margins incidental on cystectomy path 11/2019.   Hypercholesteremia    Hypertension    Hx labile/uncontrolled.  Improved s/p visit to HTN clinic 2019.   Iron deficiency anemia 2017   Colonoscopy, EGD, and capsule endoscopy w/out obvious cause/source found.  Oral iron helping as of 03/2016.   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 No avid enhancement or mass effect. Obs by urol as of 10/2019 and 11/2020.   Past Surgical History:  Procedure Laterality Date   back injection  oct 2011, 07/2010, 12/2012   no surgery   Capsule endoscopy  02/2016   mild duodenitis, o/w NEG   CARDIOVASCULAR STRESS TEST  05/2009; 07/2015   07/2023 low risk lexiscan    carotid dopplers  11/2014   Bilateral - 1% to 9% ICA stenosis. Vertebral artery flow is   CATARACT EXTRACTION, BILATERAL     Jan, Feb 2022   COLONOSCOPY  08/2008; 02/2016; 02/2017; 05/27/20   2017: 12 polyps (adenomatous).  02/2017 repeat TCS--multiple polyps. 04/2020 +adenomas.   CORONARY ANGIOPLASTY WITH STENT PLACEMENT   12/04/2014   4.0 x 12 mm Synergy stent to the mid LAD   CORONARY ARTERY BYPASS GRAFT N/A 05/06/2021   Procedure: OFF PUMP CORONARY ARTERY BYPASS GRAFTING (CABG) X TWO, USING LEFT INTERNAL MAMMARY ARTERY AND RIGHT LEG GREATER SAPHENOUS VEIN HARVESTED ENDOSCOPICALLY;  Surgeon: Shyrl Linnie KIDD, MD;  Location: [facility] OR;  Service: Open Heart Surgery;  Laterality: N/A;   CYSTOSCOPY  05/24/2019   CYSTOSCOPY W/ TRANSURETHRAL RESECTION OF POSTERIOR URETHERAL VALVES  06/12/2019   2-5cm   CYSTOSCOPY WITH INJECTION N/A 12/13/2019   Procedure: CYSTOSCOPY WITH INJECTION;  Surgeon: Alvaro Hummer, MD;  Location: [facility];  Service: Urology;  Laterality: N/A;   ESOPHAGOGASTRODUODENOSCOPY  02/2016   Gastritis.  NEG h pylori.     IR IMAGING GUIDED PORT INSERTION  09/12/2019   IR REMOVAL TUN ACCESS W/ PORT W/O FL MOD SED  08/20/2020   LE arterial vascular study  02/2016   Per Dr. Shlomo, no evidence of peripheral vascular disease   LEFT HEART CATH AND CORONARY ANGIOGRAPHY N/A 05/01/2021   Procedure: LEFT HEART CATH AND CORONARY ANGIOGRAPHY;  Surgeon: Verlin Lonni BIRCH, MD;  Location: [facility];  Service: Cardiovascular;  Laterality: N/A;   LEFT HEART CATHETERIZATION WITH CORONARY ANGIOGRAM N/A 12/04/2014   Procedure: LEFT HEART CATHETERIZATION WITH CORONARY ANGIOGRAM;  Surgeon: Toribio JONELLE Fuel, MD; LAD 99% then aneurysmal, OM1 40%, CFX 50%, RCA 40%, 50% then occluded   LYMPHADENECTOMY Bilateral 12/13/2019   Procedure: LYMPHADENECTOMY;  Surgeon: Alvaro Hummer, MD;  Location: [facility];  Service: Urology;  Laterality: Bilateral;   ROBOT ASSISTED LAPAROSCOPIC COMPLETE CYSTECT ILEAL CONDUIT N/A 12/13/2019   Procedure: XI ROBOTIC ASSISTED LAPAROSCOPIC COMPLETE CYSTECT ILEAL CONDUIT AND RADICAL PROSTATECTOMY;  Surgeon: Alvaro Hummer, MD;  Location: [facility];  Service: Urology;  Laterality: N/A;  6 HRS   ROTATOR CUFF REPAIR Left 09/2013   left   SHOULDER INJECTION  10/2012   TEE WITHOUT  CARDIOVERSION N/A 05/06/2021   Procedure: TRANSESOPHAGEAL ECHOCARDIOGRAM (TEE);  Surgeon: Shyrl Linnie KIDD, MD;  Location[facility];  Service: Open Heart Surgery;  Laterality: N/A;   TRANSTHORACIC ECHOCARDIOGRAM  11/2014   EF 50-55%, some wall motion abnormalities noted, grade I diast dysfxn   TRANSTHORACIC ECHOCARDIOGRAM  05/01/2021   04/2021 pre CABG, EF 55-50%, nl wm, grd I DD, valves fine.  07/2023 grd I DD, o/w good   TRANSURETHRAL RESECTION OF BLADDER TUMOR N/A 06/12/2019   INVASIVE HIGH GRADE PAPILLARY UROTHELIAL CARCINOMA, invading muscularis propria.  Procedure: TRANSURETHRAL RESECTION OF BLADDER TUMOR (TURBT);  Surgeon: Carolee Sherwood BIRCH DOUGLAS, MD;  Location: [facility];  Service: Urology;  Laterality: N/A;     Current Meds  Medication Sig   acetaminophen  (TYLENOL ) 650 MG CR tablet Take 650 mg by mouth every 8 (eight) hours  as needed for pain.   allopurinol  (ZYLOPRIM ) 300 MG tablet TAKE 1 TABLET BY MOUTH EVERY DAY   ammonium lactate (LAC-HYDRIN) 12 % lotion APPLY TWICE DAILY TO ARMS AND HANDS TO INCREASE MOISTURE AND REDUCE EASY BRUSING. IT CAN ALSO BE USED SAFELY ON NECK, CHEST AND LEGS IF DESIRED. FACIAL USE IS FINE AS WELL, BUT THE SCENT IS SOMETIMES BOTHERSOME   Ascorbic Acid (VITAMIN C PO) Take by mouth daily.   aspirin  EC 81 MG tablet Take 81 mg by mouth daily.   Cholecalciferol (D3 PO) Take 1 capsule by mouth daily.   clopidogrel  (PLAVIX ) 75 MG tablet TAKE 1 TABLET BY MOUTH EVERY DAY   desonide (DESOWEN) 0.05 % cream APPLY TO AFFECTED AREA EVERY DAY   hydrocortisone 2.5 % cream Apply topically 2 (two) times daily.   loratadine  (CLARITIN ) 10 MG tablet Take 20 mg by mouth daily.   metoprolol  tartrate (LOPRESSOR ) 25 MG tablet TAKE 0.5 TABLETS BY MOUTH 2 TIMES DAILY.   pantoprazole  (PROTONIX ) 40 MG tablet Take 1 tablet (40 mg total) by mouth daily before breakfast.   PREVIDENT 5000 BOOSTER PLUS 1.1 % PSTE Take 1 application by mouth as directed.   rosuvastatin  (CRESTOR ) 40 MG tablet TAKE  1 TABLET BY MOUTH EVERY DAY   traMADol  (ULTRAM ) 50 MG tablet TAKE 1 TABLET BY MOUTH THREE TIMES A DAY AS NEEDED FOR PAIN   triamcinolone  (NASACORT ) 55 MCG/ACT AERO nasal inhaler Place 1 spray into the nose 2 (two) times daily as needed (allergies).     Allergies:   Patient has no known allergies.   Social History   Tobacco Use   Smoking status: Former    Current packs/day: 0.00    Average packs/day: 2.0 packs/day for 35.0 years (70.0 ttl pk-yrs)    Types: Cigarettes    Start date: 09/29/1963    Quit date: 09/28/1998    Years since quitting: 25.0   Smokeless tobacco: Former    Types: Snuff    Quit date: 02/09/1986   Tobacco comments:    as a teenager  Vaping Use   Vaping status: Never Used  Substance Use Topics   Alcohol use: No    Alcohol/week: 0.0 standard drinks of alcohol    Comment: quit 1994   Drug use: No     Family Hx: The patient's family history includes Bladder Cancer in his maternal grandfather; Breast cancer in his mother and sister; COPD in his brother; Congestive Heart Failure in his mother; Diabetes in his mother; Heart disease in his brother and father; Lung cancer in his father. There is no history of Colon cancer, Esophageal cancer, Pancreatic cancer, Rectal cancer, Stomach cancer, Prostate cancer, or Colon polyps.  ROS:   Please see the history of present illness.     All other systems reviewed and are negative.   Prior CV studies:   The following studies were reviewed today:  Cardiac cath  Labs/Other Tests and Data Reviewed:     Recent Labs: 03/04/2023: ALT 12 07/05/2023: Hemoglobin 14.4; Platelets 171; TSH 1.97 07/22/2023: BUN 16; Creatinine, Ser 1.14; NT-Pro BNP 149; Potassium 4.2; Sodium 143   Recent Lipid Panel Lab Results  Component Value Date/Time   CHOL 121 09/03/2023 10:18 AM   CHOL 126 10/15/2021 09:53 AM   TRIG 110 09/03/2023 10:18 AM   HDL 60 09/03/2023 10:18 AM   HDL 54 10/15/2021 09:53 AM   CHOLHDL 2.0 09/03/2023 10:18 AM    LDLCALC 41 09/03/2023 10:18 AM    Wt Readings  from Last 3 Encounters:  10/04/23 219 lb (99.3 kg)  09/03/23 215 lb 9.6 oz (97.8 kg)  08/18/23 213 lb (96.6 kg)     Risk Assessment/Calculations:          Objective:    Vital Signs:  BP 110/70   Pulse (!) 56   Ht 6' 3 (1.905 m)   Wt 219 lb (99.3 kg)   SpO2 96%   BMI 27.37 kg/m   GEN: Well nourished, well developed in no acute distress HEENT: Normal NECK: No JVD; No carotid bruits LYMPHATICS: No lymphadenopathy CARDIAC:RRR, no murmurs, rubs, gallops RESPIRATORY:  Clear to auscultation without rales, wheezing or rhonchi  ABDOMEN: Soft, non-tender, non-distended MUSCULOSKELETAL:  No edema; No deformity  SKIN: Warm and dry NEUROLOGIC:  Alert and oriented x 3 PSYCHIATRIC:  Normal affect  ASSESSMENT & PLAN:    1.  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 The RCA was felt well collateralized and he underwent PCI of the LAD.   -He had recurrent chest pain with cath showing heavily calcified LAD, patent mid LAD stent with minimal stenosis with unsuccessful PCI and underwent CABG x2 on 05/06/2021. -He denies any further anginal sx -continue prescription drug management with aspirin  81 mg daily, Plavix  75 mg daily, Lopressor  12.5 mg twice daily and Crestor  40 mg daily with as needed refills  2.  Hypertension -BP controlled on exam today -Continue drug management with Lopressor  12.5 mg twice daily with as needed refills   3.  Hyperlipidemia  - his LDL goal is < 70.   -I have personally reviewed and interpreted outside labs performed by patient's PCP which showed LDL 41 and HDL 60 on 09/03/2023 ALT 12 on 03/04/2023 -Continue prescription drug management with Crestor  40 mg daily with as needed refills   Medication Adjustments/Labs and Tests Ordered: Current medicines are reviewed at length with the patient today.  Concerns regarding medicines are outlined above.   Tests Ordered: No orders of the  defined types were placed in this encounter.   Medication Changes: No orders of the defined types were placed in this encounter.   Follow Up:  In Person in 1 year(s)  Signed, Wilbert Bihari, MD  10/04/2023 11:26 AM    [facility]

## 2023-10-04 NOTE — Patient Instructions (Signed)
Medication Instructions:  Your physician recommends that you continue on your current medications as directed. Please refer to the Current Medication list given to you today.  *If you need a refill on your cardiac medications before your next appointment, please call your pharmacy*   Lab Work: None.  If you have labs (blood work) drawn today and your tests are completely normal, you will receive your results only by: MyChart Message (if you have MyChart) OR A paper copy in the mail If you have any lab test that is abnormal or we need to change your treatment, we will call you to review the results.   Testing/Procedures: None.   Follow-Up: At [facility], you and your health needs are our priority.  As part of our continuing mission to provide you with exceptional heart care, we have created designated Provider Care Teams.  These Care Teams include your primary Cardiologist (physician) and Advanced Practice Providers (APPs -  Physician Assistants and Nurse Practitioners) who all work together to provide you with the care you need, when you need it.  We recommend signing up for the patient portal called "MyChart".  Sign up information is provided on this After Visit Summary.  MyChart is used to connect with patients for Virtual Visits (Telemedicine).  Patients are able to view lab/test results, encounter notes, upcoming appointments, etc.  Non-urgent messages can be sent to your provider as well.   To learn more about what you can do with MyChart, go to [email or web address].    Your next appointment:   1 year(s)  Provider:   Traci Turner, MD     

## 2023-10-07 ENCOUNTER — Other Ambulatory Visit: Payer: Self-pay | Admitting: Family Medicine

## 2023-10-08 ENCOUNTER — Ambulatory Visit: Payer: Medicare HMO | Admitting: Cardiology

## 2023-10-22 ENCOUNTER — Other Ambulatory Visit: Payer: Self-pay | Admitting: Cardiology

## 2023-10-22 DIAGNOSIS — I25118 Atherosclerotic heart disease of native coronary artery with other forms of angina pectoris: Secondary | ICD-10-CM

## 2023-11-03 DIAGNOSIS — Z936 Other artificial openings of urinary tract status: Secondary | ICD-10-CM | POA: Diagnosis not present

## 2023-11-03 DIAGNOSIS — Z8551 Personal history of malignant neoplasm of bladder: Secondary | ICD-10-CM | POA: Diagnosis not present

## 2023-11-09 DIAGNOSIS — L570 Actinic keratosis: Secondary | ICD-10-CM | POA: Diagnosis not present

## 2023-11-09 DIAGNOSIS — C44622 Squamous cell carcinoma of skin of right upper limb, including shoulder: Secondary | ICD-10-CM | POA: Diagnosis not present

## 2023-11-11 ENCOUNTER — Encounter: Payer: Self-pay | Admitting: Nurse Practitioner

## 2023-11-11 ENCOUNTER — Ambulatory Visit (INDEPENDENT_AMBULATORY_CARE_PROVIDER_SITE_OTHER): Payer: Medicare HMO | Admitting: Nurse Practitioner

## 2023-11-11 VITALS — BP 120/68 | HR 54 | Ht 75.0 in | Wt 218.0 lb

## 2023-11-11 DIAGNOSIS — Z8719 Personal history of other diseases of the digestive system: Secondary | ICD-10-CM | POA: Diagnosis not present

## 2023-11-11 DIAGNOSIS — Z860101 Personal history of adenomatous and serrated colon polyps: Secondary | ICD-10-CM

## 2023-11-11 DIAGNOSIS — R1310 Dysphagia, unspecified: Secondary | ICD-10-CM | POA: Diagnosis not present

## 2023-11-11 DIAGNOSIS — K219 Gastro-esophageal reflux disease without esophagitis: Secondary | ICD-10-CM

## 2023-11-11 DIAGNOSIS — R1319 Other dysphagia: Secondary | ICD-10-CM

## 2023-11-11 NOTE — Patient Instructions (Addendum)
Until we can better understand what is going on with your swallowing be sure to take swallowing precautions. Eat slowly, chew food well before swallowing. Drink  liquids in between each bite to avoid food impaction. Let me know if swallowing problems persists / get worse.   _______________________________________________________  If your blood pressure at your visit was 140/90 or greater, please contact your primary care physician to follow up on this.  _______________________________________________________  If you are age 66 or older, your body mass index should be between 23-30. Your Body mass index is 27.25 kg/m. If this is out of the aforementioned range listed, please consider follow up with your Primary Care Provider.  If you are age 35 or younger, your body mass index should be between 19-25. Your Body mass index is 27.25 kg/m. If this is out of the aformentioned range listed, please consider follow up with your Primary Care Provider.   ________________________________________________________  The [facility] providers would like to encourage you to use [facility] to communicate with providers for non-urgent requests or questions.  Due to long hold times on the telephone, sending your provider a message by [facility] may be a faster and more efficient way to get a response.  Please allow 48 business hours for a response.  Please remember that this is for non-urgent requests.  _______________________________________________________  I appreciate the opportunity to care for you. Willette Cluster, NP

## 2023-11-11 NOTE — Progress Notes (Signed)
ASSESSMENT    Brief Narrative:  83 y.o.  male known to Dr. 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 See PMH for any additional medical & surgical history   Chronic GERD Symptoms overall managed well on daily pantoprazole 40 mg  Chronic intermittent solid food dysphagia /history of esophageal stenosis  Status post balloon dilation up to 18 mm in January 2023. No improvement with dilation. Admits to eating fast and sometimes not drinking liquids during meals  History of colon polyps.   Small tubular adenoma removed in August 2021. No longer undergoing surveillance colonoscopies due to age    PLAN   --Swallowing precautions discussed. Advised to eat slowly, chew food well before swallowing. Drink  liquids in between each bite to avoid food impaction. --He wants to hold off on any further evaluation / treatment of dysphagia. He will practice above measures and let us know if symptoms progress and will consider barium swallow or esophageal manometry -- Continue pantoprazole 40 mg daily before breakfast.   HPI   Chief complaint : Follow up on GERD.  Last seen January 2023  Brandon Haynes has a history of chronic GERD.  As long as he takes daily pantoprazole before breakfast he seems to do well.  He also has chronic solid food dysphagia.  In January 2023 he underwent an EGD with findings of benign esophageal stenosis which was dilated to 16 mm.  He mentions today that he still gets intermittent solid food dysphagia, probably about once a week.  Swallows liquids without any problems.  He does not really think that the esophageal dilation in 2023 improved his symptoms.  He admits to eating too quickly, often large bites.  Sometimes he does not even drink any fluids until after his meal is complete.     Bowel movements overall . Takes stools softneners and eats fruits to keep bowels regular.    GI History / Pertinent GI Studies   **All endoscopic studies may not be included here    EGD Jan 2023 - The examined portions of the nasopharynx, oropharynx and larynx were normal. - Benign-appearing esophageal stenosis. Dilated. - Z-line irregular. - Granular gastric mucosa. Biopsied. - Two gastric polyps. Resected and retrieved. - Normal examined duodenum.  Colonoscopy Aug 2021  --two 3-5 mm polyps in transverse, removed.Sigmoid diverticulosis, internal hemorrhoids.  One of the polyps was a TA, other was colonic mucosa. No recall due to age      Latest Ref Rng & Units 03/04/2023   10:04 AM 02/13/2022   10:36 AM 10/15/2021    9:53 AM  Hepatic Function  Total Protein 6.1 - 8.1 g/dL 6.7  6.6    AST 10 - 35 U/L 18  15    ALT 9 - 46 U/L 12  9  9    Total Bilirubin 0.2 - 1.2 mg/dL 0.6  0.4         Latest Ref Rng & Units 07/05/2023    4:26 PM 03/04/2023   10:04 AM 02/13/2022   10:36 AM  CBC  WBC 3.8 - 10.8 Thousand/uL 7.9  7.2  7.3   Hemoglobin 13.2 - 17.1 g/dL 16.1  09.6  04.5   Hematocrit 38.5 - 50.0 % 44.0  43.3  40.5   Platelets 140 - 400 Thousand/uL 171  175  192      Past  Medical History:  Diagnosis Date   Allergic rhinitis    allergy shots via    Anxiety    Bladder cancer (HCC) 05/2019   [id number].  Lesion resected. No mets.  He got neoadjuvant chemo x 4 cycles.  Cystoprostectomy with ileal conduit formation 12/13/19 (Dr. Berneice Heinrich). 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 difficile colitis 01/09/2020; late May 2021   oral vancomycin   CAD (coronary artery disease)    mid LAD stent 2016; CABG 04/2021   CAD, multiple vessel    a. CP/near-syncope 11/2014 s/p DES to MLAD, occluded RCA after takeoff of RV marginal with collaterals treated medically, otherwise mild nonobstructive disease. EF 55%;  b. 07/2015 Lexiscan CL: EF 56%, no  ischemia/infarct.    Cataracts, both eyes    Chronic constipation    worse since ileal conduit surgery was done 2020   Chronic low back pain    DDD (degenerative disc disease), lumbar    GERD (gastroesophageal reflux disease)    PPI bid needed to control sx's   Gout    Hearing loss    History  of basal cell carcinoma    History of adenomatous polyp of colon    History of prostate cancer 11/2019   grd 2 cancer with neg margins incidental on cystectomy path 11/2019.   Hypercholesteremia    Hypertension    Hx labile/uncontrolled.  Improved s/p visit to HTN clinic 2019.   Iron deficiency anemia 2017   Colonoscopy, EGD, and capsule endoscopy w/out obvious cause/source found.  Oral iron helping as of 03/2016.   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 No avid enhancement or mass effect. Obs by urol as of 10/2019 and 11/2020.    Past Surgical History:  Procedure Laterality Date   back injection  oct 2011, 07/2010, 12/2012   no surgery   Capsule endoscopy  02/2016   mild duodenitis, o/w NEG   CARDIOVASCULAR STRESS TEST  05/2009; 07/2015   07/2023 low risk lexiscan   carotid dopplers  11/2014   Bilateral - 1% to 9% ICA stenosis. Vertebral artery flow is   CATARACT EXTRACTION, BILATERAL     Jan, Feb 2022   COLONOSCOPY  08/2008; 02/2016; 02/2017; 05/27/20   2017: 12 polyps (adenomatous).  02/2017 repeat TCS--multiple polyps. 04/2020 +adenomas.   CORONARY ANGIOPLASTY WITH STENT PLACEMENT  12/04/2014   4.0 x 12 mm Synergy stent to the mid LAD   CORONARY ARTERY BYPASS GRAFT N/A 05/06/2021   Procedure: OFF PUMP CORONARY ARTERY BYPASS GRAFTING (CABG) X TWO, USING LEFT INTERNAL MAMMARY ARTERY AND RIGHT LEG GREATER SAPHENOUS VEIN HARVESTED ENDOSCOPICALLY;  Surgeon: Corliss Skains, MD;  Location: [facility] OR;  Service: Open Heart  Surgery;  Laterality: N/A;   CYSTOSCOPY  05/24/2019   CYSTOSCOPY W/ TRANSURETHRAL RESECTION OF POSTERIOR URETHERAL VALVES  06/12/2019   2-5cm   CYSTOSCOPY WITH INJECTION N/A 12/13/2019   Procedure: CYSTOSCOPY WITH INJECTION;  Surgeon: Sebastian Ache, MD;  Location: [facility];  Service: Urology;  Laterality: N/A;   ESOPHAGOGASTRODUODENOSCOPY  02/2016   Gastritis.  NEG h pylori.     IR IMAGING GUIDED PORT INSERTION  09/12/2019   IR REMOVAL TUN ACCESS W/ PORT W/O FL MOD SED  08/20/2020   LE arterial vascular study  02/2016  Per Dr. Mayford Knife, no evidence of peripheral vascular disease   LEFT HEART CATH AND CORONARY ANGIOGRAPHY N/A 05/01/2021   Procedure: LEFT HEART CATH AND CORONARY ANGIOGRAPHY;  Surgeon: Kathleene Hazel, MD;  Location: [facility];  Service: Cardiovascular;  Laterality: N/A;   LEFT HEART CATHETERIZATION WITH CORONARY ANGIOGRAM N/A 12/04/2014   Procedure: LEFT HEART CATHETERIZATION WITH CORONARY ANGIOGRAM;  Surgeon: Dolores Patty, MD; LAD 99% then aneurysmal, OM1 40%, CFX 50%, RCA 40%, 50% then occluded   LYMPHADENECTOMY Bilateral 12/13/2019   Procedure: LYMPHADENECTOMY;  Surgeon: Sebastian Ache, MD;  Location: [facility];  Service: Urology;  Laterality: Bilateral;   ROBOT ASSISTED LAPAROSCOPIC COMPLETE CYSTECT ILEAL CONDUIT N/A 12/13/2019   Procedure: XI ROBOTIC ASSISTED LAPAROSCOPIC COMPLETE CYSTECT ILEAL CONDUIT AND RADICAL PROSTATECTOMY;  Surgeon: Sebastian Ache, MD;  Location: [facility];  Service: Urology;  Laterality: N/A;  6 HRS   ROTATOR CUFF REPAIR Left 09/2013   left   SHOULDER INJECTION  10/2012   TEE WITHOUT CARDIOVERSION N/A 05/06/2021   Procedure: TRANSESOPHAGEAL ECHOCARDIOGRAM (TEE);  Surgeon: Corliss Skains, MD;  Location: [facility];  Service: Open Heart Surgery;  Laterality: N/A;   TRANSTHORACIC ECHOCARDIOGRAM  11/2014   EF 50-55%, some wall motion abnormalities noted, grade I diast dysfxn   TRANSTHORACIC ECHOCARDIOGRAM  05/01/2021   04/2021 pre  CABG, EF 55-50%, nl wm, grd I DD, valves fine.  07/2023 grd I DD, o/w good   TRANSURETHRAL RESECTION OF BLADDER TUMOR N/A 06/12/2019   INVASIVE HIGH GRADE PAPILLARY UROTHELIAL CARCINOMA, invading muscularis propria.  Procedure: TRANSURETHRAL RESECTION OF BLADDER TUMOR (TURBT);  Surgeon: Crista Elliot, MD;  Location: [facility];  Service: Urology;  Laterality: N/A;    Family History  Problem Relation Age of Onset   Lung cancer Father    Heart disease Father    Breast cancer Mother    Congestive Heart Failure Mother    Diabetes Mother    Breast cancer Sister    Heart disease Brother    COPD Brother    Bladder Cancer Maternal Grandfather    Colon cancer Neg Hx    Esophageal cancer Neg Hx    Pancreatic cancer Neg Hx    Rectal cancer Neg Hx    Stomach cancer Neg Hx    Prostate cancer Neg Hx    Colon polyps Neg Hx     Current Medications, Allergies, Family History and Social History were reviewed in [employer] record.     Current Outpatient Medications  Medication Sig Dispense Refill   acetaminophen (TYLENOL) 650 MG CR tablet Take 650 mg by mouth every 8 (eight) hours as needed for pain.     allopurinol (ZYLOPRIM) 300 MG tablet TAKE 1 TABLET BY MOUTH EVERY DAY 90 tablet 3   ammonium lactate (LAC-HYDRIN) 12 % lotion APPLY TWICE DAILY TO ARMS AND HANDS TO INCREASE MOISTURE AND REDUCE EASY BRUSING. IT CAN ALSO BE USED SAFELY ON NECK, CHEST AND LEGS IF DESIRED. FACIAL USE IS FINE AS WELL, BUT THE SCENT IS SOMETIMES BOTHERSOME     Ascorbic Acid (VITAMIN C PO) Take by mouth daily.     aspirin EC 81 MG tablet Take 81 mg by mouth daily.     Cholecalciferol (D3 PO) Take 1 capsule by mouth daily.     clopidogrel (PLAVIX) 75 MG tablet TAKE 1 TABLET BY MOUTH EVERY DAY 90 tablet 3   desonide (DESOWEN) 0.05 % cream APPLY TO AFFECTED AREA EVERY DAY 60 g 2  hydrocortisone 2.5 % cream Apply topically 2 (two) times daily.     loratadine (CLARITIN) 10 MG tablet Take 20 mg by  mouth daily.     metoprolol tartrate (LOPRESSOR) 25 MG tablet TAKE 1/2 TABLET TWICE A DAY BY MOUTH 90 tablet 3   pantoprazole (PROTONIX) 40 MG tablet Take 1 tablet (40 mg total) by mouth daily before breakfast. 30 tablet 3   PREVIDENT 5000 BOOSTER PLUS 1.1 % PSTE Take 1 application by mouth as directed.  1   rosuvastatin (CRESTOR) 40 MG tablet TAKE 1 TABLET BY MOUTH EVERY DAY 90 tablet 3   traMADol (ULTRAM) 50 MG tablet TAKE 1 TABLET BY MOUTH THREE TIMES A DAY AS NEEDED FOR PAIN 90 tablet 5   triamcinolone (NASACORT) 55 MCG/ACT AERO nasal inhaler Place 1 spray into the nose 2 (two) times daily as needed (allergies).     No current facility-administered medications for this visit.    Review of Systems: No chest pain. No shortness of breath. No urinary complaints.    Physical Exam  Filed Weights   11/11/23 1052  Weight: 218 lb (98.9 kg)   Wt Readings from Last 3 Encounters:  11/11/23 218 lb (98.9 kg)  10/04/23 219 lb (99.3 kg)  09/03/23 215 lb 9.6 oz (97.8 kg)    BP 120/68   Pulse (!) 54   Ht 6\' 3"  (1.905 m)   Wt 218 lb (98.9 kg)   BMI 27.25 kg/m  Constitutional:  Pleasant, generally well appearing male in no acute distress. Psychiatric: Normal mood and affect. Behavior is normal. EENT: Pupils normal.  Conjunctivae are normal. No scleral icterus. Neck supple.  Cardiovascular: Normal rate, regular rhythm.  Pulmonary/chest: Effort normal and breath sounds normal. No wheezing, rales or rhonchi. Abdominal: Soft, nondistended, nontender. Bowel sounds active throughout. There are no masses palpable. No hepatomegaly. Neurological: Alert and oriented to person place and time.    Willette Cluster, NP  11/11/2023, 11:25 AM

## 2023-11-11 NOTE — Progress Notes (Signed)
Agree with assessment and plan as outlined.

## 2023-11-29 ENCOUNTER — Other Ambulatory Visit: Payer: Self-pay | Admitting: Cardiology

## 2023-11-29 DIAGNOSIS — I25118 Atherosclerotic heart disease of native coronary artery with other forms of angina pectoris: Secondary | ICD-10-CM

## 2024-01-21 ENCOUNTER — Other Ambulatory Visit: Payer: Self-pay | Admitting: Nurse Practitioner

## 2024-01-29 ENCOUNTER — Other Ambulatory Visit: Payer: Self-pay | Admitting: Family Medicine

## 2024-02-16 DIAGNOSIS — Z8546 Personal history of malignant neoplasm of prostate: Secondary | ICD-10-CM | POA: Diagnosis not present

## 2024-02-16 DIAGNOSIS — C672 Malignant neoplasm of lateral wall of bladder: Secondary | ICD-10-CM | POA: Diagnosis not present

## 2024-02-16 DIAGNOSIS — N281 Cyst of kidney, acquired: Secondary | ICD-10-CM | POA: Diagnosis not present

## 2024-02-16 DIAGNOSIS — K5730 Diverticulosis of large intestine without perforation or abscess without bleeding: Secondary | ICD-10-CM | POA: Diagnosis not present

## 2024-02-16 DIAGNOSIS — C61 Malignant neoplasm of prostate: Secondary | ICD-10-CM | POA: Diagnosis not present

## 2024-02-16 DIAGNOSIS — R918 Other nonspecific abnormal finding of lung field: Secondary | ICD-10-CM | POA: Diagnosis not present

## 2024-03-02 DIAGNOSIS — R8271 Bacteriuria: Secondary | ICD-10-CM | POA: Diagnosis not present

## 2024-03-02 DIAGNOSIS — C672 Malignant neoplasm of lateral wall of bladder: Secondary | ICD-10-CM | POA: Diagnosis not present

## 2024-03-06 ENCOUNTER — Encounter: Payer: Self-pay | Admitting: Family Medicine

## 2024-03-06 ENCOUNTER — Ambulatory Visit (INDEPENDENT_AMBULATORY_CARE_PROVIDER_SITE_OTHER): Payer: Medicare HMO | Admitting: Family Medicine

## 2024-03-06 VITALS — BP 138/80 | HR 54 | Temp 97.8°F | Ht 75.0 in | Wt 214.6 lb

## 2024-03-06 DIAGNOSIS — G894 Chronic pain syndrome: Secondary | ICD-10-CM | POA: Diagnosis not present

## 2024-03-06 DIAGNOSIS — I10 Essential (primary) hypertension: Secondary | ICD-10-CM

## 2024-03-06 DIAGNOSIS — M5441 Lumbago with sciatica, right side: Secondary | ICD-10-CM | POA: Diagnosis not present

## 2024-03-06 DIAGNOSIS — E7800 Pure hypercholesterolemia, unspecified: Secondary | ICD-10-CM | POA: Diagnosis not present

## 2024-03-06 DIAGNOSIS — G8929 Other chronic pain: Secondary | ICD-10-CM

## 2024-03-06 DIAGNOSIS — Z0000 Encounter for general adult medical examination without abnormal findings: Secondary | ICD-10-CM | POA: Diagnosis not present

## 2024-03-06 MED ORDER — TRAMADOL HCL 50 MG PO TABS: ORAL_TABLET | ORAL | 5 refills | Status: DC

## 2024-03-06 MED ORDER — ALLOPURINOL 300 MG PO TABS: 300.0000 mg | ORAL_TABLET | Freq: Every day | ORAL | 3 refills | Status: AC

## 2024-03-06 NOTE — Progress Notes (Signed)
 Office Note 03/06/2024  CC:  Chief Complaint  Patient presents with   Annual Exam   Patient is a 83 y.o. male who is here for annual health maintenance exam and 16-month follow-up hypertension, hyperlipidemia, and chronic pain syndrome. A/P as of last visit: "1  hypertension, well-controlled on Lopressor  12.5 mg twice daily. Electrolytes and creatinine normal about 6 weeks ago.   2 Hypercholesterolemia, doing well on rosuvastatin  40 mg a day. LDL was 51 approximately 6 months ago. Lipid panel today.   3 chronic pain syndrome.  Bilateral low back pain with intermittent bilateral sciatica. Well-controlled using tramadol  50 mg 1-3 times a day and gabapentin  300 mg 3 times daily. Continue this--> a new prescription was not needed today.. Controlled substance contract and urine drug screen are up-to-date.   #4 fatigue. Lab workup unrevealing. Echo with diastolic dysfunction, otherwise good. Stress test low risk. This is getting better gradually. No new workup at this time."  INTERIM HX: Brandon Haynes is doing fine. He does some gardening.    His pain is well-controlled with the current regimen. Chronic pain: Lumbar degenerative disc disease with intermittent right-sided sciatica.  Has received epidural steroid injections in the past and these have been somewhat helpful.  I have prescribed him tramadol  and small amounts long-term and he has done well with this. States pain is well controlled and his medication allows him to be functional. PMP AWARE reviewed today: most recent rx for tramadol  was filled 02/04/2024, # 90, rx by me. No red flags.  Home blood pressures range from the 120s to 140 systolic.  Diastolics in the 70s.  Past Medical History:  Diagnosis Date   Allergic rhinitis    allergy shots via [place]   Anxiety    Bladder cancer (HCC) 05/2019   [id number].  Lesion resected. No mets.  He got neoadjuvant chemo x 4 cycles.  Cystoprostectomy with ileal conduit formation 12/13/19  (Dr. Secundino Dach). 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 difficile colitis 01/09/2020; late May 2021   oral vancomycin    CAD (coronary artery disease)    mid LAD stent 2016; CABG 04/2021   CAD, multiple vessel    a. CP/near-syncope 11/2014 s/p DES to MLAD, occluded RCA after takeoff of RV marginal with collaterals treated medically, otherwise mild nonobstructive disease. EF 55%;  b. 07/2015 Lexiscan  CL: EF 56%, no ischemia/infarct.    Cataracts, both eyes    Chronic constipation    worse since ileal conduit surgery was done 2020   Chronic low back pain    DDD (degenerative disc disease), lumbar    GERD (gastroesophageal reflux disease)    PPI bid needed to control sx's   Gout    Hearing loss    History  of basal cell carcinoma    History of adenomatous polyp of colon    History of prostate cancer 11/2019   grd 2 cancer with neg margins incidental on cystectomy path 11/2019.   Hypercholesteremia    Hypertension    Hx labile/uncontrolled.  Improved s/p visit to HTN clinic 2019.   Iron deficiency anemia 2017   Colonoscopy, EGD, and capsule endoscopy w/out obvious cause/source found.  Oral iron helping as of 03/2016.   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 No avid enhancement or mass  effect. Obs by urol as of 10/2019 and 11/2020.    Past Surgical History:  Procedure Laterality Date   back injection  oct 2011, 07/2010, 12/2012   no surgery   Capsule endoscopy  02/2016   mild duodenitis, o/w NEG   CARDIOVASCULAR STRESS TEST  05/2009; 07/2015   07/2023 low risk lexiscan    carotid dopplers  11/2014   Bilateral - 1% to 9% ICA stenosis. Vertebral artery flow is   CATARACT EXTRACTION, BILATERAL     Jan, Feb 2022   COLONOSCOPY  08/2008; 02/2016; 02/2017; 05/27/20   2017: 12  polyps (adenomatous).  02/2017 repeat TCS--multiple polyps. 04/2020 +adenomas.   CORONARY ANGIOPLASTY WITH STENT PLACEMENT  12/04/2014   4.0 x 12 mm Synergy stent to the mid LAD   CORONARY ARTERY BYPASS GRAFT N/A 05/06/2021   Procedure: OFF PUMP CORONARY ARTERY BYPASS GRAFTING (CABG) X TWO, USING LEFT INTERNAL MAMMARY ARTERY AND RIGHT LEG GREATER SAPHENOUS VEIN HARVESTED ENDOSCOPICALLY;  Surgeon: Hilarie Lovely, MD;  Location: [facility] OR;  Service: Open Heart Surgery;  Laterality: N/A;   CYSTOSCOPY  05/24/2019   CYSTOSCOPY W/ TRANSURETHRAL RESECTION OF POSTERIOR URETHERAL VALVES  06/12/2019   2-5cm   CYSTOSCOPY WITH INJECTION N/A 12/13/2019   Procedure: CYSTOSCOPY WITH INJECTION;  Surgeon: Osborn Blaze, MD;  Location: [facility];  Service: Urology;  Laterality: N/A;   ESOPHAGOGASTRODUODENOSCOPY  02/2016   Gastritis.  NEG h pylori.     IR IMAGING GUIDED PORT INSERTION  09/12/2019   IR REMOVAL TUN ACCESS W/ PORT W/O FL MOD SED  08/20/2020   LE arterial vascular study  02/2016   Per Dr. Micael Adas, no evidence of peripheral vascular disease   LEFT HEART CATH AND CORONARY ANGIOGRAPHY N/A 05/01/2021   Procedure: LEFT HEART CATH AND CORONARY ANGIOGRAPHY;  Surgeon: Odie Benne, MD;  Location: [facility];  Service: Cardiovascular;  Laterality: N/A;   LEFT HEART CATHETERIZATION WITH CORONARY ANGIOGRAM N/A 12/04/2014   Procedure: LEFT HEART CATHETERIZATION WITH CORONARY ANGIOGRAM;  Surgeon: Mardell Shade, MD; LAD 99% then aneurysmal, OM1 40%, CFX 50%, RCA 40%, 50% then occluded   LYMPHADENECTOMY Bilateral 12/13/2019   Procedure: LYMPHADENECTOMY;  Surgeon: Osborn Blaze, MD;  Location: [facility];  Service: Urology;  Laterality: Bilateral;   ROBOT ASSISTED LAPAROSCOPIC COMPLETE CYSTECT ILEAL CONDUIT N/A 12/13/2019   Procedure: XI ROBOTIC ASSISTED LAPAROSCOPIC COMPLETE CYSTECT ILEAL CONDUIT AND RADICAL PROSTATECTOMY;  Surgeon: Osborn Blaze, MD;  Location: [facility];  Service: Urology;   Laterality: N/A;  6 HRS   ROTATOR CUFF REPAIR Left 09/2013   left   SHOULDER INJECTION  10/2012   TEE WITHOUT CARDIOVERSION N/A 05/06/2021   Procedure: TRANSESOPHAGEAL ECHOCARDIOGRAM (TEE);  Surgeon: Hilarie Lovely, MD;  Location: [facility] OR;  Service: Open Heart Surgery;  Laterality: N/A;   TRANSTHORACIC ECHOCARDIOGRAM  11/2014   EF 50-55%, some wall motion abnormalities noted, grade I diast dysfxn   TRANSTHORACIC ECHOCARDIOGRAM  05/01/2021   04/2021 pre CABG, EF 55-50%, nl wm, grd I DD, valves fine.  07/2023 grd I DD, o/w good   TRANSURETHRAL RESECTION OF BLADDER TUMOR N/A 06/12/2019   INVASIVE HIGH GRADE PAPILLARY UROTHELIAL CARCINOMA, invading muscularis propria.  Procedure: TRANSURETHRAL RESECTION OF BLADDER TUMOR (TURBT);  Surgeon: Samson Croak, MD;  Location: [facility];  Service: Urology;  Laterality: N/A;    Family History  Problem Relation Age of Onset   Lung cancer Father    Heart disease Father    Breast cancer Mother    Congestive  Heart Failure Mother    Diabetes Mother    Breast cancer Sister    Heart disease Brother    COPD Brother    Bladder Cancer Maternal Grandfather    Colon cancer Neg Hx    Esophageal cancer Neg Hx    Pancreatic cancer Neg Hx    Rectal cancer Neg Hx    Stomach cancer Neg Hx    Prostate cancer Neg Hx    Colon polyps Neg Hx     Social History   Socioeconomic History   Marital status: Married    Spouse name: Stana Ear   Number of children: 3   Years of education: Not on file   Highest education level: Not on file  Occupational History   Occupation: retired  Tobacco Use   Smoking status: Former    Current packs/day: 0.00    Average packs/day: 2.0 packs/day for 35.0 years (70.0 ttl pk-yrs)    Types: Cigarettes    Start date: 09/29/1963    Quit date: 09/28/1998    Years since quitting: 25.4   Smokeless tobacco: Former    Types: Snuff    Quit date: 02/09/1986   Tobacco comments:    as a teenager  Vaping Use   Vaping status: Never Used   Substance and Sexual Activity   Alcohol use: No    Alcohol/week: 0.0 standard drinks of alcohol    Comment: quit 1994   Drug use: No   Sexual activity: Not Currently  Other Topics Concern   Not on file  Social History Narrative   Married, 3 children.   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   Orig from [place], lived [place] same farm all his life.   Former smoker: quit approx around 1990.  80 pack-yr hx.   Alcohol-none, used to drink a lot of beer on weekends.  No drugs.   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   Diet: fair   Social Drivers of Corporate investment banker Strain: Low Risk  (03/25/2022)   Overall Financial Resource Strain (CARDIA)    Difficulty of Paying Living Expenses: Not hard at all  Food Insecurity: No Food Insecurity (03/25/2022)   Hunger Vital Sign    Worried About Running Out of Food in the Last Year: Never true    Ran Out of Food in the Last Year: Never true  Transportation Needs: No Transportation Needs (03/25/2022)   PRAPARE - Administrator, Civil Service (Medical): No    Lack of Transportation (Non-Medical): No  Physical Activity: Insufficiently Active (03/25/2022)   Exercise Vital Sign    Days of Exercise per Week: 3 days    Minutes of Exercise per Session: 30 min  Stress: No Stress Concern Present (03/25/2022)   [employer] of Occupational Health - Occupational Stress Questionnaire    Feeling of Stress : Not at all  Social Connections: Moderately Integrated (03/25/2022)   Social Connection and Isolation Panel [NHANES]    Frequency of Communication with Friends and Family: More than three times a week    Frequency of Social Gatherings with Friends and Family: More than three times a week    Attends Religious Services: More than 4 times per year    Active Member of [employer] or Organizations: No    Attends Banker Meetings: Never    Marital Status: Married  Catering manager Violence: Not At Risk  (03/25/2022)   Humiliation, Afraid, Rape, and Kick questionnaire  Fear of Current or Ex-Partner: No    Emotionally Abused: No    Physically Abused: No    Sexually Abused: No    Outpatient Medications Prior to Visit  Medication Sig Dispense Refill   acetaminophen  (TYLENOL ) 650 MG CR tablet Take 650 mg by mouth every 8 (eight) hours as needed for pain.     ammonium lactate (LAC-HYDRIN) 12 % lotion APPLY TWICE DAILY TO ARMS AND HANDS TO INCREASE MOISTURE AND REDUCE EASY BRUSING. IT CAN ALSO BE USED SAFELY ON NECK, CHEST AND LEGS IF DESIRED. FACIAL USE IS FINE AS WELL, BUT THE SCENT IS SOMETIMES BOTHERSOME     Ascorbic Acid (VITAMIN C PO) Take by mouth daily.     aspirin  EC 81 MG tablet Take 81 mg by mouth daily.     Cholecalciferol (D3 PO) Take 1 capsule by mouth daily.     clopidogrel  (PLAVIX ) 75 MG tablet TAKE 1 TABLET BY MOUTH EVERY DAY 90 tablet 3   desonide (DESOWEN) 0.05 % cream APPLY TO AFFECTED AREA EVERY DAY 60 g 2   hydrocortisone 2.5 % cream Apply topically 2 (two) times daily.     loratadine  (CLARITIN ) 10 MG tablet Take 20 mg by mouth daily.     metoprolol  tartrate (LOPRESSOR ) 25 MG tablet TAKE 1/2 TABLET TWICE A DAY BY MOUTH 90 tablet 3   pantoprazole  (PROTONIX ) 40 MG tablet TAKE 1 TABLET BY MOUTH DAILY BEFORE BREAKFAST 30 tablet 3   PREVIDENT 5000 BOOSTER PLUS 1.1 % PSTE Take 1 application by mouth as directed.  1   rosuvastatin  (CRESTOR ) 40 MG tablet TAKE 1 TABLET BY MOUTH EVERY DAY 90 tablet 3   triamcinolone  (NASACORT ) 55 MCG/ACT AERO nasal inhaler Place 1 spray into the nose 2 (two) times daily as needed (allergies).     allopurinol  (ZYLOPRIM ) 300 MG tablet TAKE 1 TABLET BY MOUTH EVERY DAY 90 tablet 0   traMADol  (ULTRAM ) 50 MG tablet TAKE 1 TABLET BY MOUTH THREE TIMES A DAY AS NEEDED FOR PAIN 90 tablet 5   No facility-administered medications prior to visit.    No Known Allergies  Review of Systems  Constitutional:  Negative for appetite change, chills, fatigue and  fever.  HENT:  Negative for congestion, dental problem, ear pain and sore throat.   Eyes:  Negative for discharge, redness and visual disturbance.  Respiratory:  Negative for cough, chest tightness, shortness of breath and wheezing.   Cardiovascular:  Negative for chest pain, palpitations and leg swelling.  Gastrointestinal:  Negative for abdominal pain, blood in stool, diarrhea, nausea and vomiting.  Genitourinary:  Negative for difficulty urinating, dysuria, flank pain, frequency, hematuria and urgency.  Musculoskeletal:  Negative for arthralgias, back pain, joint swelling, myalgias and neck stiffness.  Skin:  Negative for pallor and rash.  Neurological:  Negative for dizziness, speech difficulty, weakness and headaches.  Hematological:  Negative for adenopathy. Does not bruise/bleed easily.  Psychiatric/Behavioral:  Negative for confusion and sleep disturbance. The patient is not nervous/anxious.     PE;    03/06/2024   10:03 AM 03/06/2024    9:51 AM 11/11/2023   10:52 AM  Vitals with BMI  Height  6\' 3"  6\' 3"   Weight  214 lbs 10 oz 218 lbs  BMI  26.82 27.25  Systolic 138 146 161  Diastolic 80 70 68  Pulse  54 54     Gen: Alert, well appearing.  Patient is oriented to person, place, time, and situation. AFFECT: pleasant, lucid thought and speech.  ENT: Ears: EACs clear, normal epithelium.  TMs with good light reflex and landmarks bilaterally.  Eyes: no injection, icteris, swelling, or exudate.  EOMI, PERRLA. Nose: no drainage or turbinate edema/swelling.  No injection or focal lesion.  Mouth: lips without lesion/swelling.  Oral mucosa pink and moist.  Dentition intact and without obvious caries or gingival swelling.  Oropharynx without erythema, exudate, or swelling.  Neck: supple/nontender.  No LAD, mass, or TM.  Carotid pulses 2+ bilaterally, without bruits. CV: RRR, no m/r/g.   LUNGS: CTA bilat, nonlabored resps, good aeration in all lung fields. ABD: soft, NT, ND, BS normal.  No  hepatospenomegaly or mass.  No bruits. EXT: no clubbing, cyanosis, or edema.  Musculoskeletal: no joint swelling, erythema, warmth, or tenderness.  ROM of all joints intact. Skin - no sores or suspicious lesions or rashes or color changes  Pertinent labs:  Lab Results  Component Value Date   TSH 1.97 07/05/2023   Lab Results  Component Value Date   WBC 7.9 07/05/2023   HGB 14.4 07/05/2023   HCT 44.0 07/05/2023   MCV 93.4 07/05/2023   PLT 171 07/05/2023   Lab Results  Component Value Date   CREATININE 1.14 07/22/2023   BUN 16 07/22/2023   NA 143 07/22/2023   K 4.2 07/22/2023   CL 105 07/22/2023   CO2 22 07/22/2023   Lab Results  Component Value Date   ALT 12 03/04/2023   AST 18 03/04/2023   ALKPHOS 53 08/11/2021   BILITOT 0.6 03/04/2023   Lab Results  Component Value Date   CHOL 121 09/03/2023   Lab Results  Component Value Date   HDL 60 09/03/2023   Lab Results  Component Value Date   LDLCALC 41 09/03/2023   Lab Results  Component Value Date   TRIG 110 09/03/2023   Lab Results  Component Value Date   CHOLHDL 2.0 09/03/2023   Lab Results  Component Value Date   PSA <0.015 01/19/2022   PSA <0.015 11/12/2020   PSA <0.015 06/04/2020   Lab Results  Component Value Date   HGBA1C 5.7 (H) 05/06/2021   ASSESSMENT AND PLAN:   #1 health maintenance exam: Reviewed age and gender appropriate health maintenance issues (prudent diet, regular exercise, health risks of tobacco and excessive alcohol, use of seatbelts, fire alarms in home, use of sunscreen).  Also reviewed age and gender appropriate health screening as well as vaccine recommendations. Vaccines: ALL UTD. Labs: CMET, FLP, CBC. Prostate ca screening: n/a d/t hx of prostatectomy->followed by urol. Colon ca screening: hx of polyps, most recently 05/27/20.   #2 Hypertension, well-controlled on Lopressor  12.5 mg twice daily. Electrolytes and creatinine today.   #3 Hypercholesterolemia, doing well on  rosuvastatin  40 mg a day. Lipid panel and hepatic panel today.   3 chronic pain syndrome.  Bilateral low back pain with intermittent bilateral sciatica. Well-controlled using tramadol  50 mg 1-3 times a day. Controlled substance contract and urine drug screen are up-to-date  An After Visit Summary was printed and given to the patient.  FOLLOW UP:  Return in about 6 months (around 09/05/2024) for routine chronic illness f/u.  Signed:  Arletha Lady, MD           03/06/2024

## 2024-03-07 DIAGNOSIS — L821 Other seborrheic keratosis: Secondary | ICD-10-CM | POA: Diagnosis not present

## 2024-03-07 LAB — CBC WITH DIFFERENTIAL/PLATELET
Absolute Lymphocytes: 1774 {cells}/uL (ref 850–3900)
Absolute Monocytes: 526 {cells}/uL (ref 200–950)
Basophils Absolute: 44 {cells}/uL (ref 0–200)
Basophils Relative: 0.6 %
Eosinophils Absolute: 153 {cells}/uL (ref 15–500)
Eosinophils Relative: 2.1 %
HCT: 45.4 % (ref 38.5–50.0)
Hemoglobin: 14.6 g/dL (ref 13.2–17.1)
MCH: 30.7 pg (ref 27.0–33.0)
MCHC: 32.2 g/dL (ref 32.0–36.0)
MCV: 95.4 fL (ref 80.0–100.0)
MPV: 10.4 fL (ref 7.5–12.5)
Monocytes Relative: 7.2 %
Neutro Abs: 4803 {cells}/uL (ref 1500–7800)
Neutrophils Relative %: 65.8 %
Platelets: 177 10*3/uL (ref 140–400)
RBC: 4.76 10*6/uL (ref 4.20–5.80)
RDW: 14.3 % (ref 11.0–15.0)
Total Lymphocyte: 24.3 %
WBC: 7.3 10*3/uL (ref 3.8–10.8)

## 2024-03-07 LAB — COMPREHENSIVE METABOLIC PANEL WITH GFR
AG Ratio: 2 (calc) (ref 1.0–2.5)
ALT: 12 U/L (ref 9–46)
AST: 15 U/L (ref 10–35)
Albumin: 4.5 g/dL (ref 3.6–5.1)
Alkaline phosphatase (APISO): 50 U/L (ref 35–144)
BUN: 13 mg/dL (ref 7–25)
CO2: 26 mmol/L (ref 20–32)
Calcium: 9.5 mg/dL (ref 8.6–10.3)
Chloride: 108 mmol/L (ref 98–110)
Creat: 1.08 mg/dL (ref 0.70–1.22)
Globulin: 2.3 g/dL (ref 1.9–3.7)
Glucose, Bld: 101 mg/dL — ABNORMAL HIGH (ref 65–99)
Potassium: 4.4 mmol/L (ref 3.5–5.3)
Sodium: 144 mmol/L (ref 135–146)
Total Bilirubin: 0.7 mg/dL (ref 0.2–1.2)
Total Protein: 6.8 g/dL (ref 6.1–8.1)
eGFR: 69 mL/min/{1.73_m2} (ref >=60)

## 2024-03-07 LAB — LIPID PANEL
Cholesterol: 122 mg/dL (ref <200)
HDL: 69 mg/dL (ref >=40)
LDL Cholesterol (Calc): 34 mg/dL
Non-HDL Cholesterol (Calc): 53 mg/dL (ref <130)
Total CHOL/HDL Ratio: 1.8 (calc) (ref <5.0)
Triglycerides: 106 mg/dL (ref <150)

## 2024-03-08 ENCOUNTER — Ambulatory Visit: Payer: Self-pay | Admitting: Family Medicine

## 2024-03-20 DIAGNOSIS — Z936 Other artificial openings of urinary tract status: Secondary | ICD-10-CM | POA: Diagnosis not present

## 2024-03-20 DIAGNOSIS — Z8551 Personal history of malignant neoplasm of bladder: Secondary | ICD-10-CM | POA: Diagnosis not present

## 2024-04-04 ENCOUNTER — Other Ambulatory Visit: Payer: Self-pay | Admitting: Gastroenterology

## 2024-04-29 DIAGNOSIS — Z8551 Personal history of malignant neoplasm of bladder: Secondary | ICD-10-CM | POA: Diagnosis not present

## 2024-04-29 DIAGNOSIS — Z936 Other artificial openings of urinary tract status: Secondary | ICD-10-CM | POA: Diagnosis not present

## 2024-05-01 ENCOUNTER — Ambulatory Visit: Payer: Self-pay | Admitting: *Deleted

## 2024-05-01 ENCOUNTER — Encounter: Payer: Self-pay | Admitting: Family Medicine

## 2024-05-01 ENCOUNTER — Ambulatory Visit: Admitting: Family Medicine

## 2024-05-01 VITALS — BP 126/61 | HR 62 | Temp 97.5°F | Ht 75.0 in | Wt 217.6 lb

## 2024-05-01 DIAGNOSIS — R140 Abdominal distension (gaseous): Secondary | ICD-10-CM

## 2024-05-01 DIAGNOSIS — R8290 Unspecified abnormal findings in urine: Secondary | ICD-10-CM

## 2024-05-01 DIAGNOSIS — R1011 Right upper quadrant pain: Secondary | ICD-10-CM | POA: Diagnosis not present

## 2024-05-01 DIAGNOSIS — M545 Low back pain, unspecified: Secondary | ICD-10-CM | POA: Diagnosis not present

## 2024-05-01 LAB — POCT URINALYSIS DIPSTICK
Bilirubin, UA: NEGATIVE
Blood, UA: POSITIVE
Glucose, UA: NEGATIVE
Ketones, UA: NEGATIVE
Nitrite, UA: NEGATIVE
Protein, UA: NEGATIVE
Spec Grav, UA: 1.01 (ref 1.010–1.025)
Urobilinogen, UA: 0.2 U/dL
pH, UA: 6 (ref 5.0–8.0)

## 2024-05-01 NOTE — Progress Notes (Signed)
 OFFICE VISIT  05/01/2024  CC:  Chief Complaint  Patient presents with   Abdominal Pain    Upper RUQ pain with movement, some LBP started 1 week ago; comes and goes. Denies diarrhea, fever, urination pain or vomiting.     Patient is a 83 y.o. male who presents for abd pain.  HPI: About a week ago Brandon Haynes started to feel pain intermittently in the right side and upper abdomen under the rib cage.  Hurts mostly when he moves his torso in certain directions and when he lays on his back.  Sometimes when he takes a deep breath he feels it more.  No preceding overuse or trauma. Says he feels a little bit bloated and slight decreased appetite.  No nausea.  He describes eating an excellent supper last night. No constipation or diarrhea.  Eating does not make the pain occur.  No fever.  No blood in urine.  Urine looks light yellow in ileal conduit bag. The pain does not radiate around into the lower abdomen or groin.  He has no rash. He denies cough, shortness of breath, chest pain, dizziness, or weight loss.  His weight is 3 pounds up compared to 2 months ago in the office.  Review of systems: As above in HPI.  Past Medical History:  Diagnosis Date   Allergic rhinitis    allergy shots via [place]   Anxiety    Bladder cancer (HCC) 05/2019   [id number].  Lesion resected. No mets.  He got neoadjuvant chemo x 4 cycles.  Cystoprostectomy with ileal conduit formation 12/13/19 (Dr. Alvaro). 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 difficile colitis 01/09/2020; late May 2021   oral vancomycin    CAD (coronary artery disease)    mid LAD stent 2016; CABG 04/2021   CAD, multiple vessel    a. CP/near-syncope 11/2014 s/p DES to MLAD, occluded RCA after takeoff of RV marginal with collaterals treated medically, otherwise mild nonobstructive disease. EF 55%;  b. 07/2015 Lexiscan  CL: EF 56%, no ischemia/infarct.    Cataracts, both eyes    Chronic constipation    worse  since ileal conduit surgery was done 2020   Chronic low back pain    DDD (degenerative disc disease), lumbar    GERD (gastroesophageal reflux disease)    PPI bid needed to control sx's   Gout    Hearing loss    History  of basal cell carcinoma    History of adenomatous polyp of colon    History of prostate cancer 11/2019   grd 2 cancer with neg margins incidental on cystectomy path 11/2019.   Hypercholesteremia    Hypertension    Hx labile/uncontrolled.  Improved s/p visit to HTN clinic 2019.   Iron deficiency anemia 2017   Colonoscopy, EGD, and capsule endoscopy w/out obvious cause/source found.  Oral iron helping as of 03/2016.   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 No avid enhancement or mass effect. Obs by urol as of 10/2019 and 11/2020.    Past Surgical History:  Procedure Laterality Date   back injection  oct 2011, 07/2010, 12/2012   no surgery   Capsule endoscopy  02/2016   mild duodenitis, o/w NEG   CARDIOVASCULAR STRESS TEST  05/2009; 07/2015   07/2023 low risk  lexiscan    carotid dopplers  11/2014   Bilateral - 1% to 9% ICA stenosis. Vertebral artery flow is   CATARACT EXTRACTION, BILATERAL     Jan, Feb 2022   COLONOSCOPY  08/2008; 02/2016; 02/2017; 05/27/20   2017: 12 polyps (adenomatous).  02/2017 repeat TCS--multiple polyps. 04/2020 +adenomas.   CORONARY ANGIOPLASTY WITH STENT PLACEMENT  12/04/2014   4.0 x 12 mm Synergy stent to the mid LAD   CORONARY ARTERY BYPASS GRAFT N/A 05/06/2021   Procedure: OFF PUMP CORONARY ARTERY BYPASS GRAFTING (CABG) X TWO, USING LEFT INTERNAL MAMMARY ARTERY AND RIGHT LEG GREATER SAPHENOUS VEIN HARVESTED ENDOSCOPICALLY;  Surgeon: Shyrl Linnie KIDD, MD;  Location: [facility] OR;  Service: Open Heart Surgery;  Laterality: N/A;   CYSTOSCOPY  05/24/2019   CYSTOSCOPY W/  TRANSURETHRAL RESECTION OF POSTERIOR URETHERAL VALVES  06/12/2019   2-5cm   CYSTOSCOPY WITH INJECTION N/A 12/13/2019   Procedure: CYSTOSCOPY WITH INJECTION;  Surgeon: Alvaro Hummer, MD;  Location: [facility];  Service: Urology;  Laterality: N/A;   ESOPHAGOGASTRODUODENOSCOPY  02/2016   Gastritis.  NEG h pylori.     IR IMAGING GUIDED PORT INSERTION  09/12/2019   IR REMOVAL TUN ACCESS W/ PORT W/O FL MOD SED  08/20/2020   LE arterial vascular study  02/2016   Per Dr. Shlomo, no evidence of peripheral vascular disease   LEFT HEART CATH AND CORONARY ANGIOGRAPHY N/A 05/01/2021   Procedure: LEFT HEART CATH AND CORONARY ANGIOGRAPHY;  Surgeon: Verlin Lonni BIRCH, MD;  Location: [facility];  Service: Cardiovascular;  Laterality: N/A;   LEFT HEART CATHETERIZATION WITH CORONARY ANGIOGRAM N/A 12/04/2014   Procedure: LEFT HEART CATHETERIZATION WITH CORONARY ANGIOGRAM;  Surgeon: Toribio JONELLE Fuel, MD; LAD 99% then aneurysmal, OM1 40%, CFX 50%, RCA 40%, 50% then occluded   LYMPHADENECTOMY Bilateral 12/13/2019   Procedure: LYMPHADENECTOMY;  Surgeon: Alvaro Hummer, MD;  Location: [facility];  Service: Urology;  Laterality: Bilateral;   ROBOT ASSISTED LAPAROSCOPIC COMPLETE CYSTECT ILEAL CONDUIT N/A 12/13/2019   Procedure: XI ROBOTIC ASSISTED LAPAROSCOPIC COMPLETE CYSTECT ILEAL CONDUIT AND RADICAL PROSTATECTOMY;  Surgeon: Alvaro Hummer, MD;  Location: [facility];  Service: Urology;  Laterality: N/A;  6 HRS   ROTATOR CUFF REPAIR Left 09/2013   left   SHOULDER INJECTION  10/2012   TEE WITHOUT CARDIOVERSION N/A 05/06/2021   Procedure: TRANSESOPHAGEAL ECHOCARDIOGRAM (TEE);  Surgeon: Shyrl Linnie KIDD, MD;  Location: [facility] OR;  Service: Open Heart Surgery;  Laterality: N/A;   TRANSTHORACIC ECHOCARDIOGRAM  11/2014   EF 50-55%, some wall motion abnormalities noted, grade I diast dysfxn   TRANSTHORACIC ECHOCARDIOGRAM  05/01/2021   04/2021 pre CABG, EF 55-50%, nl wm, grd I DD, valves fine.  07/2023 grd I DD, o/w  good   TRANSURETHRAL RESECTION OF BLADDER TUMOR N/A 06/12/2019   INVASIVE HIGH GRADE PAPILLARY UROTHELIAL CARCINOMA, invading muscularis propria.  Procedure: TRANSURETHRAL RESECTION OF BLADDER TUMOR (TURBT);  Surgeon: Carolee Sherwood BIRCH DOUGLAS, MD;  Location: [facility];  Service: Urology;  Laterality: N/A;    Outpatient Medications Prior to Visit  Medication Sig Dispense Refill   acetaminophen  (TYLENOL ) 650 MG CR tablet Take 650 mg by mouth every 8 (eight) hours as needed for pain.     allopurinol  (ZYLOPRIM ) 300 MG tablet Take 1 tablet (300 mg total) by mouth daily. 90 tablet 3   ammonium lactate (LAC-HYDRIN) 12 % lotion APPLY TWICE DAILY TO ARMS AND HANDS TO INCREASE MOISTURE AND REDUCE EASY BRUSING. IT CAN ALSO BE USED SAFELY ON NECK, CHEST AND LEGS IF  DESIRED. FACIAL USE IS FINE AS WELL, BUT THE SCENT IS SOMETIMES BOTHERSOME     Ascorbic Acid (VITAMIN C PO) Take by mouth daily.     aspirin  EC 81 MG tablet Take 81 mg by mouth daily.     Cholecalciferol (D3 PO) Take 1 capsule by mouth daily.     clopidogrel  (PLAVIX ) 75 MG tablet TAKE 1 TABLET BY MOUTH EVERY DAY 90 tablet 3   desonide (DESOWEN) 0.05 % cream APPLY TO AFFECTED AREA EVERY DAY 60 g 2   hydrocortisone 2.5 % cream Apply topically 2 (two) times daily.     loratadine  (CLARITIN ) 10 MG tablet Take 20 mg by mouth daily.     metoprolol  tartrate (LOPRESSOR ) 25 MG tablet TAKE 1/2 TABLET TWICE A DAY BY MOUTH 90 tablet 3   pantoprazole  (PROTONIX ) 40 MG tablet TAKE 1 TABLET BY MOUTH EVERY DAY BEFORE BREAKFAST 90 tablet 1   PREVIDENT 5000 BOOSTER PLUS 1.1 % PSTE Take 1 application by mouth as directed.  1   rosuvastatin  (CRESTOR ) 40 MG tablet TAKE 1 TABLET BY MOUTH EVERY DAY 90 tablet 3   traMADol  (ULTRAM ) 50 MG tablet TAKE 1 TABLET BY MOUTH THREE TIMES A DAY AS NEEDED FOR PAIN 90 tablet 5   triamcinolone  (NASACORT ) 55 MCG/ACT AERO nasal inhaler Place 1 spray into the nose 2 (two) times daily as needed (allergies).     No facility-administered  medications prior to visit.    No Known Allergies  Review of Systems  As per HPI  PE:    05/01/2024    3:56 PM 03/06/2024   10:03 AM 03/06/2024    9:51 AM  Vitals with BMI  Height 6' 3  6' 3  Weight 217 lbs 10 oz  214 lbs 10 oz  BMI 27.2  26.82  Systolic 126 138 853  Diastolic 61 80 70  Pulse 62  54    Physical Exam  Gen: Alert, well appearing.  Patient is oriented to person, place, time, and situation. AFFECT: pleasant, lucid thought and speech. CV: RRR, no m/r/g.   LUNGS: CTA bilat, nonlabored resps, good aeration in all lung fields. Abdomen is soft and nondistended.  Has some discomfort to deep palpation in the right upper abdomen but it is inconsistent.  No rash.  Bowel sounds are normal.  He has no tenderness over the ribs. No CVA tenderness. Extremities: No edema  LABS:  Last CBC Lab Results  Component Value Date   WBC 7.3 03/06/2024   HGB 14.6 03/06/2024   HCT 45.4 03/06/2024   MCV 95.4 03/06/2024   MCH 30.7 03/06/2024   RDW 14.3 03/06/2024   PLT 177 03/06/2024   Last metabolic panel Lab Results  Component Value Date   GLUCOSE 101 (H) 03/06/2024   NA 144 03/06/2024   K 4.4 03/06/2024   CL 108 03/06/2024   CO2 26 03/06/2024   BUN 13 03/06/2024   CREATININE 1.08 03/06/2024   EGFR 69 03/06/2024   CALCIUM  9.5 03/06/2024   PHOS 4.0 02/21/2020   PROT 6.8 03/06/2024   ALBUMIN  4.4 08/11/2021   BILITOT 0.7 03/06/2024   ALKPHOS 53 08/11/2021   AST 15 03/06/2024   ALT 12 03/06/2024   ANIONGAP 8 05/09/2021   Last hemoglobin A1c Lab Results  Component Value Date   HGBA1C 5.7 (H) 05/06/2021   IMPRESSION AND PLAN:  Right upper quadrant/right side pain.  Suspect musculoskeletal. Urine looks light yellow and clear.   Urinalysis today showed 2+ leukocyte and trace  blood.  His clinical presentation does not fit with urinary infection.  Hard to interpret urinalysis in the setting of ileal conduit. Specimen sent for culture. Check CBC, c-Met, and lipase. No  new medication today.  An After Visit Summary was printed and given to the patient.  FOLLOW UP: Return if symptoms worsen or fail to improve.  Signed:  Gerlene Hockey, MD           05/01/2024

## 2024-05-01 NOTE — Telephone Encounter (Signed)
 No further action needed at this time.

## 2024-05-01 NOTE — Telephone Encounter (Signed)
 FYI Only or Action Required?: FYI only for provider.  Patient was last seen in primary care on 03/06/2024 by McGowen, Aleene DEL, MD.  Called Nurse Triage reporting Abdominal Pain.  Symptoms began a week ago.  Interventions attempted: Nothing.  Symptoms are: unchanged.  Triage Disposition: See Physician Within 24 Hours  Patient/caregiver understands and will follow disposition?: yes    Reason for Disposition  [1] MILD pain (e.g., does not interfere with normal activities) AND [2] comes and goes (cramps) AND [3] present > 72 hours  (Exception: This same abdominal pain is a chronic symptom recurrent or ongoing AND present > 4 weeks.)  Answer Assessment - Initial Assessment Questions 1. LOCATION: Where does it hurt?      Upper R quadrant pain- pain with laying and breathing, rising from going from sitting to standing 2. RADIATION: Does the pain shoot anywhere else? (e.g., chest, back)     One area only 3. ONSET: When did the pain begin? (e.g., minutes, hours or days ago)      1 week ago 4. SUDDEN: Gradual or sudden onset?     *No Answer* 5. PATTERN Does the pain come and go, or is it constant?     Comes and goes 6. SEVERITY: How bad is the pain?  (e.g., Scale 1-10; mild, moderate, or severe)     Sore- 4-5/10 7. RECURRENT SYMPTOM: Have you ever had this type of stomach pain before? If Yes, ask: When was the last time? and What happened that time?      Patient states he has ostomy bag and pain in close to that area 8. AGGRAVATING FACTORS: Does anything seem to cause this pain? (e.g., foods, stress, alcohol)     Movement causes pain, deep breath 9. CARDIAC SYMPTOMS: Do you have any of the following symptoms: chest pain, difficulty breathing, sweating, nausea?     Pain with deep breath 10. OTHER SYMPTOMS: Do you have any other symptoms? (e.g., back pain, diarrhea, fever, urination pain, vomiting)       no  Protocols used: Abdominal Pain - Upper-A-AH   Copied  from CRM #[id number]. Topic: Clinical - Red Word Triage >> May 01, 2024  8:11 AM Berneda FALCON wrote: Red Word that prompted transfer to Nurse Triage: Pt states when he breathes, he has pain under his ribs around his stomach on the right side. When he sits or stand, he has pain.  Had bladder cancer and has a ostomy bag and the pain is around this area.  This has been ongoing for about a week now and states he is leaving Friday for vacation.

## 2024-05-02 ENCOUNTER — Ambulatory Visit: Payer: Self-pay | Admitting: Family Medicine

## 2024-05-02 LAB — COMPREHENSIVE METABOLIC PANEL WITH GFR
AG Ratio: 2.1 (calc) (ref 1.0–2.5)
ALT: 12 U/L (ref 9–46)
AST: 19 U/L (ref 10–35)
Albumin: 4.4 g/dL (ref 3.6–5.1)
Alkaline phosphatase (APISO): 47 U/L (ref 35–144)
BUN: 14 mg/dL (ref 7–25)
CO2: 25 mmol/L (ref 20–32)
Calcium: 9.1 mg/dL (ref 8.6–10.3)
Chloride: 106 mmol/L (ref 98–110)
Creat: 1.1 mg/dL (ref 0.70–1.22)
Globulin: 2.1 g/dL (ref 1.9–3.7)
Glucose, Bld: 110 mg/dL — ABNORMAL HIGH (ref 65–99)
Potassium: 4.3 mmol/L (ref 3.5–5.3)
Sodium: 143 mmol/L (ref 135–146)
Total Bilirubin: 0.7 mg/dL (ref 0.2–1.2)
Total Protein: 6.5 g/dL (ref 6.1–8.1)
eGFR: 67 mL/min/1.73m2 (ref >=60)

## 2024-05-02 LAB — CBC WITH DIFFERENTIAL/PLATELET
Absolute Lymphocytes: 2324 {cells}/uL (ref 850–3900)
Absolute Monocytes: 601 {cells}/uL (ref 200–950)
Basophils Absolute: 31 {cells}/uL (ref 0–200)
Basophils Relative: 0.4 %
Eosinophils Absolute: 179 {cells}/uL (ref 15–500)
Eosinophils Relative: 2.3 %
HCT: 42.9 % (ref 38.5–50.0)
Hemoglobin: 14.4 g/dL (ref 13.2–17.1)
MCH: 31.7 pg (ref 27.0–33.0)
MCHC: 33.6 g/dL (ref 32.0–36.0)
MCV: 94.5 fL (ref 80.0–100.0)
MPV: 11 fL (ref 7.5–12.5)
Monocytes Relative: 7.7 %
Neutro Abs: 4664 {cells}/uL (ref 1500–7800)
Neutrophils Relative %: 59.8 %
Platelets: 157 Thousand/uL (ref 140–400)
RBC: 4.54 Million/uL (ref 4.20–5.80)
RDW: 13.6 % (ref 11.0–15.0)
Total Lymphocyte: 29.8 %
WBC: 7.8 Thousand/uL (ref 3.8–10.8)

## 2024-05-02 LAB — LIPASE: Lipase: 31 U/L (ref 7–60)

## 2024-05-03 LAB — URINE CULTURE
MICRO NUMBER:: 16783460
SPECIMEN QUALITY:: ADEQUATE

## 2024-05-03 NOTE — Telephone Encounter (Signed)
 Communication  Reason for CRM: Relayed message to patient   Noted.

## 2024-05-11 ENCOUNTER — Ambulatory Visit: Payer: Self-pay | Admitting: Family Medicine

## 2024-05-11 ENCOUNTER — Ambulatory Visit (HOSPITAL_COMMUNITY)
Admission: EM | Admit: 2024-05-11 | Discharge: 2024-05-11 | Disposition: A | Discharge status: Home / Self Care | Attending: Internal Medicine | Admitting: Internal Medicine

## 2024-05-11 ENCOUNTER — Ambulatory Visit (INDEPENDENT_AMBULATORY_CARE_PROVIDER_SITE_OTHER)

## 2024-05-11 ENCOUNTER — Encounter (HOSPITAL_COMMUNITY): Payer: Self-pay

## 2024-05-11 DIAGNOSIS — M79672 Pain in left foot: Secondary | ICD-10-CM

## 2024-05-11 DIAGNOSIS — M25572 Pain in left ankle and joints of left foot: Secondary | ICD-10-CM | POA: Diagnosis not present

## 2024-05-11 DIAGNOSIS — M7989 Other specified soft tissue disorders: Secondary | ICD-10-CM | POA: Diagnosis not present

## 2024-05-11 DIAGNOSIS — S99912A Unspecified injury of left ankle, initial encounter: Secondary | ICD-10-CM

## 2024-05-11 DIAGNOSIS — M85872 Other specified disorders of bone density and structure, left ankle and foot: Secondary | ICD-10-CM | POA: Diagnosis not present

## 2024-05-11 DIAGNOSIS — M19072 Primary osteoarthritis, left ankle and foot: Secondary | ICD-10-CM | POA: Diagnosis not present

## 2024-05-11 DIAGNOSIS — S93499A Sprain of other ligament of unspecified ankle, initial encounter: Secondary | ICD-10-CM

## 2024-05-11 NOTE — ED Provider Notes (Signed)
 [facility]    CSN: [id number] Arrival date & time: 05/11/24  1132      History   Chief Complaint Chief Complaint  Patient presents with   Fall    HPI Brandon Haynes is a 83 y.o. male who presents with multiple injuries since he [place] the beach 3 [place] ago, injuring his  L forearm, L heel, ankle and foot. Has a bruise on his back of upper L thigh. He had a knot on the lateral mid thigh which he applied ice and went down. The bruise in the back of his thigh is not painful. The reason for his fall is that he slipped in the living room and his L arm hit the edge of a coffee table. His L forearm is healing fine.     Past Medical History:  Diagnosis Date   Allergic rhinitis    allergy shots via [place]   Anxiety    Bladder cancer (HCC) 05/2019   [id number].  Lesion resected. No mets.  He got neoadjuvant chemo x 4 cycles.  Cystoprostectomy with ileal conduit formation 12/13/19 (Dr. Alvaro). 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 difficile colitis 01/09/2020; late May 2021   oral vancomycin    CAD (coronary artery disease)    mid LAD stent 2016; CABG 04/2021   CAD, multiple vessel    a. CP/near-syncope 11/2014 s/p DES to MLAD, occluded RCA after takeoff of RV marginal with collaterals treated medically, otherwise mild nonobstructive disease. EF 55%;  b. 07/2015 Lexiscan  CL: EF 56%, no ischemia/infarct.    Cataracts, both eyes    Chronic constipation    worse since ileal conduit surgery was done 2020   Chronic low back pain    DDD (degenerative disc disease), lumbar    GERD (gastroesophageal reflux disease)    PPI bid needed to control sx's   Gout    Hearing loss    History  of basal cell carcinoma    History of adenomatous polyp of colon    History of prostate cancer 11/2019   grd 2 cancer with neg margins incidental on cystectomy path 11/2019.   Hypercholesteremia    Hypertension    Hx labile/uncontrolled.  Improved s/p  visit to HTN clinic 2019.   Iron deficiency anemia 2017   Colonoscopy, EGD, and capsule endoscopy w/out obvious cause/source found.  Oral iron helping as of 03/2016.   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 No avid enhancement or mass effect. Obs by urol as of 10/2019 and 11/2020.    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   Recurrent epistaxis 10/19/2017   Bradycardia, drug induced 09/08/2016   Bilateral sensorineural hearing loss 06/30/2016   Subjective tinnitus, bilateral 06/30/2016   Prediabetes 04/16/2016   Leg pain 03/06/2016   Dyspnea on exertion 08/23/2015   Dizziness 08/23/2015   Rash 08/23/2015   Coronary artery disease involving native coronary artery  of native heart without angina pectoris    Syncope 12/03/2014   Osteoarthritis of multiple joints 10/18/2014   Symptomatic hypotension 03/22/2014   Goals of care, counseling/discussion 03/22/2014   Encounter to establish care with new doctor 02/26/2014   Rotator cuff tear 08/28/2013   GERD 08/29/2010   Hearing loss 06/21/2008   PERIPHERAL VASCULAR DISEASE 06/21/2008   Allergic rhinitis 06/21/2008   BASAL CELL CARCINOMA, HX OF 06/21/2008   COLONIC POLYPS 03/29/2008   HYPERCHOLESTEROLEMIA 03/29/2008   GOUT 03/29/2008   Anxiety state 03/29/2008   Essential hypertension 03/29/2008   DEGENERATIVE DISC DISEASE 03/29/2008   BACK PAIN, LUMBAR 03/29/2008    Past Surgical History:  Procedure Laterality Date   back injection   oct 2011, 07/2010, 12/2012   no surgery   Capsule endoscopy  02/2016   mild duodenitis, o/w NEG   CARDIOVASCULAR STRESS TEST  05/2009; 07/2015   07/2023 low risk lexiscan    carotid dopplers  11/2014   Bilateral - 1% to 9% ICA stenosis. Vertebral artery flow is   CATARACT EXTRACTION, BILATERAL     Jan, Feb 2022   COLONOSCOPY  08/2008; 02/2016; 02/2017; 05/27/20   2017: 12 polyps (adenomatous).  02/2017 repeat TCS--multiple polyps. 04/2020 +adenomas.   CORONARY ANGIOPLASTY WITH STENT PLACEMENT  12/04/2014   4.0 x 12 mm Synergy stent to the mid LAD   CORONARY ARTERY BYPASS GRAFT N/A 05/06/2021   Procedure: OFF PUMP CORONARY ARTERY BYPASS GRAFTING (CABG) X TWO, USING LEFT INTERNAL MAMMARY ARTERY AND RIGHT LEG GREATER SAPHENOUS VEIN HARVESTED ENDOSCOPICALLY;  Surgeon: Shyrl Linnie KIDD, MD;  Location: [facility];  Service: Open Heart Surgery;  Laterality: N/A;   CYSTOSCOPY  05/24/2019   CYSTOSCOPY W/ TRANSURETHRAL RESECTION OF POSTERIOR URETHERAL VALVES  06/12/2019   2-5cm   CYSTOSCOPY WITH INJECTION N/A 12/13/2019   Procedure: CYSTOSCOPY WITH INJECTION;  Surgeon: Alvaro Hummer, MD;  Location: [facility];  Service: Urology;  Laterality: N/A;   ESOPHAGOGASTRODUODENOSCOPY  02/2016   Gastritis.  NEG h pylori.     IR IMAGING GUIDED PORT INSERTION  09/12/2019   IR REMOVAL TUN ACCESS W/ PORT W/O FL MOD SED  08/20/2020   LE arterial vascular study  02/2016   Per Dr. Shlomo, no evidence of peripheral vascular disease   LEFT HEART CATH AND CORONARY ANGIOGRAPHY N/A 05/01/2021   Procedure: LEFT HEART CATH AND CORONARY ANGIOGRAPHY;  Surgeon: Verlin Lonni BIRCH, MD;  Location: [facility];  Service: Cardiovascular;  Laterality: N/A;   LEFT HEART CATHETERIZATION WITH CORONARY ANGIOGRAM N/A 12/04/2014   Procedure: LEFT HEART CATHETERIZATION WITH CORONARY ANGIOGRAM;  Surgeon: Toribio JONELLE Fuel, MD; LAD 99% then aneurysmal, OM1 40%, CFX 50%, RCA 40%, 50% then occluded   LYMPHADENECTOMY Bilateral  12/13/2019   Procedure: LYMPHADENECTOMY;  Surgeon: Alvaro Hummer, MD;  Location: [facility];  Service: Urology;  Laterality: Bilateral;   ROBOT ASSISTED LAPAROSCOPIC COMPLETE CYSTECT ILEAL CONDUIT N/A 12/13/2019   Procedure: XI ROBOTIC ASSISTED LAPAROSCOPIC COMPLETE CYSTECT ILEAL CONDUIT AND RADICAL PROSTATECTOMY;  Surgeon: Alvaro Hummer, MD;  Location: [facility];  Service: Urology;  Laterality: N/A;  6 HRS   ROTATOR CUFF REPAIR Left 09/2013   left   SHOULDER INJECTION  10/2012   TEE WITHOUT CARDIOVERSION N/A 05/06/2021   Procedure: TRANSESOPHAGEAL ECHOCARDIOGRAM (TEE);  Surgeon: Shyrl Linnie KIDD, MD;  Location: [facility] OR;  Service: Open Heart Surgery;  Laterality: N/A;   TRANSTHORACIC ECHOCARDIOGRAM  11/2014   EF 50-55%, some wall motion abnormalities noted, grade I diast dysfxn   TRANSTHORACIC ECHOCARDIOGRAM  05/01/2021   04/2021 pre CABG, EF 55-50%, nl wm, grd I DD, valves fine.  07/2023 grd I DD, o/w good   TRANSURETHRAL RESECTION OF BLADDER TUMOR N/A 06/12/2019   INVASIVE HIGH GRADE PAPILLARY UROTHELIAL CARCINOMA, invading muscularis propria.  Procedure: TRANSURETHRAL RESECTION OF BLADDER TUMOR (TURBT);  Surgeon: Carolee Sherwood JONETTA DOUGLAS, MD;  Location: [facility];  Service: Urology;  Laterality: N/A;       Home Medications    Prior to Admission medications   Medication Sig Start Date End Date Taking? Authorizing Provider  acetaminophen  (TYLENOL ) 650 MG CR tablet Take 650 mg by mouth every 8 (eight) hours as needed for pain.    [provider]  allopurinol  (ZYLOPRIM ) 300 MG tablet Take 1 tablet (300 mg total) by mouth daily. 03/06/24   McGowen, Aleene DEL, MD  ammonium lactate (LAC-HYDRIN) 12 % lotion APPLY TWICE DAILY TO ARMS AND HANDS TO INCREASE MOISTURE AND REDUCE EASY BRUSING. IT CAN ALSO BE USED SAFELY ON NECK, CHEST AND LEGS IF DESIRED. FACIAL USE IS FINE AS WELL, BUT THE SCENT IS SOMETIMES BOTHERSOME 08/24/23   [provider]  Ascorbic Acid (VITAMIN C PO) Take by mouth daily.     [provider]  aspirin  EC 81 MG tablet Take 81 mg by mouth daily.    [provider]  Cholecalciferol (D3 PO) Take 1 capsule by mouth daily.    [provider]  clopidogrel  (PLAVIX ) 75 MG tablet TAKE 1 TABLET BY MOUTH EVERY DAY 10/22/23   Shlomo Wilbert SAUNDERS, MD  desonide (DESOWEN) 0.05 % cream APPLY TO AFFECTED AREA EVERY DAY 10/01/21   Livingston Rigg, MD  hydrocortisone 2.5 % cream Apply topically 2 (two) times daily. 08/25/23   [provider]  loratadine  (CLARITIN ) 10 MG tablet Take 20 mg by mouth daily.    [provider]  metoprolol  tartrate (LOPRESSOR ) 25 MG tablet TAKE 1/2 TABLET TWICE A DAY BY MOUTH 10/22/23   Turner, Wilbert SAUNDERS, MD  pantoprazole  (PROTONIX ) 40 MG tablet TAKE 1 TABLET BY MOUTH EVERY DAY BEFORE BREAKFAST 04/04/24   Armbruster, Elspeth SQUIBB, MD  PREVIDENT 5000 BOOSTER PLUS 1.1 % PSTE Take 1 application by mouth as directed. 10/27/14   [provider]  rosuvastatin  (CRESTOR ) 40 MG tablet TAKE 1 TABLET BY MOUTH EVERY DAY 12/01/23   Turner, Wilbert SAUNDERS, MD  traMADol  (ULTRAM ) 50 MG tablet TAKE 1 TABLET BY MOUTH THREE TIMES A DAY AS NEEDED FOR PAIN 03/06/24   McGowen, Aleene DEL, MD  triamcinolone  (NASACORT ) 55 MCG/ACT AERO nasal inhaler Place 1 spray into the nose 2 (two) times daily as needed (allergies).    [provider]    Family History Family History  Problem Relation Age of Onset   Lung cancer Father    Heart disease Father    Breast cancer Mother    Congestive Heart Failure Mother    Diabetes Mother    Breast cancer Sister    Heart disease Brother    COPD Brother    Bladder Cancer Maternal Grandfather    Colon cancer Neg Hx    Esophageal cancer Neg Hx    Pancreatic cancer Neg Hx    Rectal cancer Neg Hx    Stomach cancer Neg Hx    Prostate cancer Neg Hx    Colon polyps Neg Hx     Social History Social History   Tobacco Use   Smoking status: Former    Current packs/day: 0.00    Average packs/day: 2.0 packs/day  for 35.0 years (70.0 ttl pk-yrs)    Types: Cigarettes    Start date: 09/29/1963    Quit date: 09/28/1998    Years since quitting: 25.6   Smokeless tobacco: Former    Types: Snuff    Quit date: 02/09/1986   Tobacco comments:    as a teenager  Vaping Use   Vaping status: Never Used  Substance Use Topics   Alcohol use: No    Alcohol/week: 0.0 standard drinks of alcohol    Comment: quit 1994   Drug use: No     Allergies   Patient has no known allergies.   Review of Systems Review of Systems  As noted in HPI  Physical Exam Triage Vital Signs ED Triage Vitals  Encounter Vitals Group     BP 05/11/24 1246 130/75     Girls Systolic BP Percentile --      Girls Diastolic BP Percentile --      Boys Systolic BP Percentile --      Boys Diastolic BP Percentile --      Pulse Rate 05/11/24 1244 62     Resp 05/11/24 1244 16     Temp 05/11/24 1244 98 F (36.7 C)     Temp Source 05/11/24 1244 Oral     SpO2 05/11/24 1244 100 %     Weight --      Height --      Head Circumference --      Peak Flow --      Pain Score 05/11/24 1246 5     Pain Loc --      Pain Education --      Exclude from Growth Chart --    No data found.  Updated Vital Signs BP 130/75 (BP Location: Right Arm)   Pulse 62   Temp 98 F (36.7 C) (Oral)   Resp 16   SpO2 100%   Visual Acuity Right Eye Distance:   Left Eye Distance:   Bilateral Distance:    Right Eye Near:   Left Eye Near:    Bilateral Near:     Physical Exam Vitals reviewed.  Constitutional:      General: He is not in acute distress.    Appearance: He is not toxic-appearing.  HENT:     Right Ear: External ear normal.     Left Ear: External ear normal.  Eyes:     General: No scleral icterus.    Conjunctiva/sclera: Conjunctivae normal.  Pulmonary:     Effort: Pulmonary effort is normal.  Musculoskeletal:     Cervical back: Neck supple.  Skin:    Comments: L THIGH- has large ecchymotic area on posterior L thigh which is not  tender. L FOOT- with moderate swelling and point tender on distal mid foot area. Has ecchymosis present on lateral foot area.  L ANKLE- moderate swelling and tender on lateral ankle area. ROM is normal.   Neurological:     Mental Status: He is alert and oriented to person, place, and time.     Gait: Gait normal.  Psychiatric:        Mood and Affect: Mood normal.        Behavior: Behavior normal.        Thought Content: Thought content normal.        Judgment: Judgment normal.      UC Treatments / Results  Labs (all labs ordered are listed, but only abnormal results are displayed) Labs Reviewed - No data to  display  EKG   Radiology DG Foot Complete Left Result Date: 05/11/2024 CLINICAL DATA:  Ponte tender on distal mid foot, ankle swelling and pain EXAM: LEFT FOOT - COMPLETE 3+ VIEW COMPARISON:  None Available. FINDINGS: Osteopenia.No acute fracture or dislocation. Severe degenerative changes of the first metatarsophalangeal joint. Degenerative changes also noted in the midfoot. Soft tissue swelling of the dorsum of the foot. No radiopaque foreign body. IMPRESSION: 1. Soft tissue swelling along the dorsum of the foot. No acute fracture or dislocation. 2. Degenerative changes of the midfoot and most severely of the first metatarsophalangeal joint. Electronically Signed   By: Rogelia Myers M.D.   On: 05/11/2024 14:22   DG Ankle Complete Left Result Date: 05/11/2024 CLINICAL DATA:  Point tender on distal mid foot, ankle swelling and pain EXAM: LEFT ANKLE COMPLETE - 3+ VIEW COMPARISON:  None Available. FINDINGS: Osteopenia.No acute fracture or dislocation. No ankle mortise widening. The talar dome is intact. Mild-to-moderate joint space loss of the tibiotalar joint. Os trigonum. Soft tissue swelling about the distal calf and ankle. IMPRESSION: 1. Osteopenia. Soft tissue swelling about the distal calf and ankle. No acute fracture or dislocation. 2. Mild-to-moderate osteoarthritis of the  ankle. Electronically Signed   By: Rogelia Myers M.D.   On: 05/11/2024 14:19    Procedures Procedures (including critical care time)  Medications Ordered in UC Medications - No data to display  Initial Impression / Assessment and Plan / UC Course  I have reviewed the triage vital signs and the nursing notes. Xrays of  L ankle and foot were negative Pertinent  imaging results that were available during my care of the patient were reviewed by me and considered in my medical decision making (see chart for details).  Left ankle and foot sprain/strain  Pt declined ankle brace. Will FU with ortho next week.    Final Clinical Impressions(s) / UC Diagnoses   Final diagnoses:  Ankle injury, left, initial encounter  Acute foot pain, left  Sprain of other ligament of ankle, unspecified laterality, initial encounter     Discharge Instructions      Your xray does not show any broken bones when I look at them, but the radiologist final read is not done and if they find something different I will call you back. Follow up with orthopedics next week so you maybe able to start physical therapy to help you heel better.      ED Prescriptions   None    PDMP not reviewed this encounter.   Lindi Carter, PA-C 05/11/24 1519

## 2024-05-11 NOTE — Discharge Instructions (Signed)
 Your xray does not show any broken bones when I look at them, but the radiologist final read is not done and if they find something different I will call you back. Follow up with orthopedics next week so you maybe able to start physical therapy to help you heel better.

## 2024-05-11 NOTE — Telephone Encounter (Signed)
 FYI Only or Action Required?: FYI only for provider.  Patient was last seen in primary care on 05/01/2024 by McGowen, Brandon DEL, MD.  Called Nurse Triage reporting Foot Pain.  Symptoms began several days ago.  Interventions attempted: Prescription medications: Tramadol .  Symptoms are: unchanged.  Triage Disposition: See HCP Within 4 Hours (Or PCP Triage)  Patient/caregiver understands and will follow disposition?: Yes                 Copied from CRM #[id number]. Topic: Clinical - Red Word Triage >> May 11, 2024  9:40 AM Brandon Haynes wrote: Red Word that prompted transfer to Nurse Triage: [place] at the beach this past weekend, foot real sore and swollen. Would like to have Dr. Candise Fax permission for an xray at [facility] [address] Reason for Disposition  [1] SEVERE pain (e.g., excruciating, unable to do any normal activities) AND [2] not improved after 2 hours of pain medicine  Answer Assessment - Initial Assessment Questions This RN recommends pt goes to urgent care today as no appt availability in office and pt agreeable.   Pt fell this past weekend at beach Pt bruised leg and left foot is swollen; denies open wounds Pt can walk on it but states he has to hobble Throbbing pain this morning, 5/10 pain level constant, goes up at times Denies numbness  Protocols used: Foot Pain-A-AH

## 2024-05-11 NOTE — ED Triage Notes (Signed)
 Patient reports that he fell  3 days while at the beach. Patient slipped and fell landing on a small table. Patient states he peeled the skin back on his left forearm.Patient has the area covered. Patient also c/o left heel, ankle and pain on the bottom of his left foot. Swelling present. Patient reports bruising on the back side of his left upper thigh.  Patient is currently taking Plavix  and Aspirin  81 mg.   Patient denies hitting his head.

## 2024-05-11 NOTE — Telephone Encounter (Signed)
 Spoke with patient regarding results/recommendations.

## 2024-05-12 ENCOUNTER — Ambulatory Visit (HOSPITAL_COMMUNITY): Payer: Self-pay

## 2024-05-15 DIAGNOSIS — S8012XA Contusion of left lower leg, initial encounter: Secondary | ICD-10-CM | POA: Diagnosis not present

## 2024-05-15 DIAGNOSIS — S93492A Sprain of other ligament of left ankle, initial encounter: Secondary | ICD-10-CM | POA: Diagnosis not present

## 2024-06-08 DIAGNOSIS — Z8551 Personal history of malignant neoplasm of bladder: Secondary | ICD-10-CM | POA: Diagnosis not present

## 2024-06-08 DIAGNOSIS — Z936 Other artificial openings of urinary tract status: Secondary | ICD-10-CM | POA: Diagnosis not present

## 2024-06-16 DIAGNOSIS — L03116 Cellulitis of left lower limb: Secondary | ICD-10-CM | POA: Diagnosis not present

## 2024-06-16 DIAGNOSIS — S93492A Sprain of other ligament of left ankle, initial encounter: Secondary | ICD-10-CM | POA: Diagnosis not present

## 2024-06-16 DIAGNOSIS — S8012XA Contusion of left lower leg, initial encounter: Secondary | ICD-10-CM | POA: Diagnosis not present

## 2024-06-23 DIAGNOSIS — L03116 Cellulitis of left lower limb: Secondary | ICD-10-CM | POA: Diagnosis not present

## 2024-07-03 NOTE — Progress Notes (Unsigned)
 Brandon Haynes , 1941-06-04, 83 y.o., male MRN: [id number] Patient Care Team    Relationship Specialty Notifications Start End  McGowen, Brandon DEL, MD PCP - General Family Medicine  02/26/14    Comment: Dr. Christi retirement (Merged)  Brandon Wilbert SAUNDERS, MD PCP - Cardiology Cardiology  03/10/21   Brandon Charleston, MD Consulting Physician Neurosurgery  02/26/14   Brandon Lonni GRADE, MD Consulting Physician Orthopedic Surgery  02/26/14   Brandon Rigg, MD Consulting Physician Dermatology  02/26/14   Brandon Wilbert SAUNDERS, MD Consulting Physician Cardiology  10/18/15   Brandon Haynes, DPM Consulting Physician Podiatry  07/11/16   Armbruster, Brandon SQUIBB, MD Consulting Physician Gastroenterology  03/18/17   Brandon Clark, MD Consulting Physician Otolaryngology  10/22/17   Brandon Smock, Haynes BROCKS, MD Consulting Physician Allergy and Immunology  01/09/19   Brandon Sherwood JONETTA DOUGLAS, MD Consulting Physician Urology  06/14/19   Brandon Windell SAILOR, MD (Inactive) Consulting Physician Oncology  07/02/19   Brandon Haynes., MD Consulting Physician Urology  01/01/20    Comment: he did pt's cystoprostatectomy  Brandon Haynes., MD Consulting Physician Urology  12/16/20     No chief complaint on file.    Subjective: Brandon Haynes is a 83 y.o. Pt presents for an OV with complaints of *** of *** duration.  Associated symptoms include ***.  Pt has tried *** to ease their symptoms.      03/06/2024    9:56 AM 09/03/2023   10:58 AM 03/04/2023    9:43 AM 08/18/2022    9:55 AM 03/25/2022    1:44 PM  Depression screen PHQ 2/9  Decreased Interest 0 0 0 0 0  Down, Depressed, Hopeless 0 0 0 0 0  PHQ - 2 Score 0 0 0 0 0    No Known Allergies Social History   Social History Narrative   Married, 3 children.   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   Orig from [place], lived [place] same farm all his life.   Former smoker: quit approx around 1990.  80 pack-yr hx.   Alcohol-none, used to drink a lot of  beer on weekends.  No drugs.   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   Diet: fair   Past Medical History:  Diagnosis Date   Allergic rhinitis    allergy shots via [place]   Anxiety    Bladder cancer (HCC) 05/2019   pTisN0Mx.  Lesion resected. No mets.  He got neoadjuvant chemo x 4 cycles.  Cystoprostectomy with ileal conduit formation 12/13/19 (Dr. Alvaro). 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 difficile colitis 01/09/2020; late May 2021   oral vancomycin    CAD (coronary artery disease)    mid LAD stent 2016; CABG 04/2021   CAD, multiple vessel    a. CP/near-syncope 11/2014 s/p DES to MLAD, occluded RCA after takeoff of RV marginal with collaterals treated medically, otherwise mild nonobstructive disease. EF 55%;  b. 07/2015 Lexiscan  CL: EF 56%, no ischemia/infarct.    Cataracts, both eyes    Chronic constipation    worse since ileal conduit surgery was done 2020   Chronic low back pain    DDD (degenerative disc disease), lumbar    GERD (gastroesophageal reflux disease)    PPI bid needed to control sx's   Gout    Hearing loss    History  of basal cell carcinoma  History of adenomatous polyp of colon    History of prostate cancer 11/2019   grd 2 cancer with neg margins incidental on cystectomy path 11/2019.   Hypercholesteremia    Hypertension    Hx labile/uncontrolled.  Improved s/p visit to HTN clinic 2019.   Iron deficiency anemia 2017   Colonoscopy, EGD, and capsule endoscopy w/out obvious cause/source found.  Oral iron helping as of 03/2016.   Osteoarthritis of multiple joints 10/18/2014   Peripheral vascular disease    Pleural thickening    chronic   Renal neoplasm 10/2019   Small, left kidney: <2 cm, 90% endophytic by CT 04/2019, stable 10/2019. No avid enhancement or mass effect. Obs by urol as of 10/2019 and 11/2020.   Past Surgical History:  Procedure Laterality Date   back injection  oct 2011, 07/2010, 12/2012    no surgery   Capsule endoscopy  02/2016   mild duodenitis, o/w NEG   CARDIOVASCULAR STRESS TEST  05/2009; 07/2015   07/2023 low risk lexiscan    carotid dopplers  11/2014   Bilateral - 1% to 9% ICA stenosis. Vertebral artery flow is   CATARACT EXTRACTION, BILATERAL     Jan, Feb 2022   COLONOSCOPY  08/2008; 02/2016; 02/2017; 05/27/20   2017: 12 polyps (adenomatous).  02/2017 repeat TCS--multiple polyps. 04/2020 +adenomas.   CORONARY ANGIOPLASTY WITH STENT PLACEMENT  12/04/2014   4.0 x 12 mm Synergy stent to the mid LAD   CORONARY ARTERY BYPASS GRAFT N/A 05/06/2021   Procedure: OFF PUMP CORONARY ARTERY BYPASS GRAFTING (CABG) X TWO, USING LEFT INTERNAL MAMMARY ARTERY AND RIGHT LEG GREATER SAPHENOUS VEIN HARVESTED ENDOSCOPICALLY;  Surgeon: Shyrl Linnie KIDD, MD;  Location: [facility] OR;  Service: Open Heart Surgery;  Laterality: N/A;   CYSTOSCOPY  05/24/2019   CYSTOSCOPY W/ TRANSURETHRAL RESECTION OF POSTERIOR URETHERAL VALVES  06/12/2019   2-5cm   CYSTOSCOPY WITH INJECTION N/A 12/13/2019   Procedure: CYSTOSCOPY WITH INJECTION;  Surgeon: Brandon Hummer, MD;  Location: [facility];  Service: Urology;  Laterality: N/A;   ESOPHAGOGASTRODUODENOSCOPY  02/2016   Gastritis.  NEG h pylori.     IR IMAGING GUIDED PORT INSERTION  09/12/2019   IR REMOVAL TUN ACCESS W/ PORT W/O FL MOD SED  08/20/2020   LE arterial vascular study  02/2016   Per Dr. Shlomo, no evidence of peripheral vascular disease   LEFT HEART CATH AND CORONARY ANGIOGRAPHY N/A 05/01/2021   Procedure: LEFT HEART CATH AND CORONARY ANGIOGRAPHY;  Surgeon: Verlin Lonni BIRCH, MD;  Location: [facility];  Service: Cardiovascular;  Laterality: N/A;   LEFT HEART CATHETERIZATION WITH CORONARY ANGIOGRAM N/A 12/04/2014   Procedure: LEFT HEART CATHETERIZATION WITH CORONARY ANGIOGRAM;  Surgeon: Toribio JONELLE Fuel, MD; LAD 99% then aneurysmal, OM1 40%, CFX 50%, RCA 40%, 50% then occluded   LYMPHADENECTOMY Bilateral 12/13/2019   Procedure:  LYMPHADENECTOMY;  Surgeon: Brandon Hummer, MD;  Location: [facility];  Service: Urology;  Laterality: Bilateral;   ROBOT ASSISTED LAPAROSCOPIC COMPLETE CYSTECT ILEAL CONDUIT N/A 12/13/2019   Procedure: XI ROBOTIC ASSISTED LAPAROSCOPIC COMPLETE CYSTECT ILEAL CONDUIT AND RADICAL PROSTATECTOMY;  Surgeon: Brandon Hummer, MD;  Location: [facility];  Service: Urology;  Laterality: N/A;  6 HRS   ROTATOR CUFF REPAIR Left 09/2013   left   SHOULDER INJECTION  10/2012   TEE WITHOUT CARDIOVERSION N/A 05/06/2021   Procedure: TRANSESOPHAGEAL ECHOCARDIOGRAM (TEE);  Surgeon: Shyrl Linnie KIDD, MD;  Location: [facility] OR;  Service: Open Heart Surgery;  Laterality: N/A;   TRANSTHORACIC ECHOCARDIOGRAM  11/2014  EF 50-55%, some wall motion abnormalities noted, Haynes I diast dysfxn   TRANSTHORACIC ECHOCARDIOGRAM  05/01/2021   04/2021 pre CABG, EF 55-50%, nl wm, grd I DD, valves fine.  07/2023 grd I DD, o/w good   TRANSURETHRAL RESECTION OF BLADDER TUMOR N/A 06/12/2019   INVASIVE HIGH Haynes PAPILLARY UROTHELIAL CARCINOMA, invading muscularis propria.  Procedure: TRANSURETHRAL RESECTION OF BLADDER TUMOR (TURBT);  Surgeon: Brandon Sherwood JONETTA DOUGLAS, MD;  Location: [facility];  Service: Urology;  Laterality: N/A;   Family History  Problem Relation Age of Onset   Lung cancer Father    Heart disease Father    Breast cancer Mother    Congestive Heart Failure Mother    Diabetes Mother    Breast cancer Sister    Heart disease Brother    COPD Brother    Bladder Cancer Maternal Grandfather    Colon cancer Neg Hx    Esophageal cancer Neg Hx    Pancreatic cancer Neg Hx    Rectal cancer Neg Hx    Stomach cancer Neg Hx    Prostate cancer Neg Hx    Colon polyps Neg Hx    Allergies as of 07/04/2024   No Known Allergies      Medication List        Accurate as of July 03, 2024 12:38 PM. If you have any questions, ask your nurse or doctor.          acetaminophen  650 MG CR tablet Commonly known as: TYLENOL  Take 650 mg by  mouth every 8 (eight) hours as needed for pain.   allopurinol  300 MG tablet Commonly known as: ZYLOPRIM  Take 1 tablet (300 mg total) by mouth daily.   ammonium lactate 12 % lotion Commonly known as: LAC-HYDRIN APPLY TWICE DAILY TO ARMS AND HANDS TO INCREASE MOISTURE AND REDUCE EASY BRUSING. IT CAN ALSO BE USED SAFELY ON NECK, CHEST AND LEGS IF DESIRED. FACIAL USE IS FINE AS WELL, BUT THE SCENT IS SOMETIMES BOTHERSOME   aspirin  EC 81 MG tablet Take 81 mg by mouth daily.   clopidogrel  75 MG tablet Commonly known as: PLAVIX  TAKE 1 TABLET BY MOUTH EVERY DAY   D3 PO Take 1 capsule by mouth daily.   desonide 0.05 % cream Commonly known as: DESOWEN APPLY TO AFFECTED AREA EVERY DAY   hydrocortisone 2.5 % cream Apply topically 2 (two) times daily.   loratadine  10 MG tablet Commonly known as: CLARITIN  Take 20 mg by mouth daily.   metoprolol  tartrate 25 MG tablet Commonly known as: LOPRESSOR  TAKE 1/2 TABLET TWICE A DAY BY MOUTH   pantoprazole  40 MG tablet Commonly known as: PROTONIX  TAKE 1 TABLET BY MOUTH EVERY DAY BEFORE BREAKFAST   PreviDent 5000 Booster Plus 1.1 % Pste Generic drug: Sodium Fluoride  Take 1 application by mouth as directed.   rosuvastatin  40 MG tablet Commonly known as: CRESTOR  TAKE 1 TABLET BY MOUTH EVERY DAY   traMADol  50 MG tablet Commonly known as: ULTRAM  TAKE 1 TABLET BY MOUTH THREE TIMES A DAY AS NEEDED FOR PAIN   triamcinolone  55 MCG/ACT Aero nasal inhaler Commonly known as: NASACORT  Place 1 spray into the nose 2 (two) times daily as needed (allergies).   VITAMIN C PO Take by mouth daily.        All past medical history, surgical history, allergies, family history, immunizations andmedications were updated in the EMR today and reviewed under the history and medication portions of their EMR.     ROS Negative, with the exception  of above mentioned in HPI   Objective:  There were no vitals taken for this visit. There is no height or  weight on file to calculate BMI.  Physical Exam   No results found. No results found. No results found for this or any previous visit (from the past 24 hours).  Assessment/Plan: Brandon Haynes is a 83 y.o. male present for OV for  *** Reviewed expectations re: course of current medical issues. Discussed self-management of symptoms. Outlined signs and symptoms indicating need for more acute intervention. Patient verbalized understanding and all questions were answered. Patient received an After-Visit Summary.    No orders of the defined types were placed in this encounter.  No orders of the defined types were placed in this encounter.  Referral Orders  No referral(s) requested today     Note is dictated utilizing voice recognition software. Although note has been proof read prior to signing, occasional typographical errors still can be missed. If any questions arise, please do not hesitate to call for verification.   electronically signed by:  Charlies Bellini, DO  [facility] - OR

## 2024-07-04 ENCOUNTER — Ambulatory Visit: Admitting: Family Medicine

## 2024-07-04 ENCOUNTER — Encounter: Payer: Self-pay | Admitting: Family Medicine

## 2024-07-04 VITALS — BP 129/79 | HR 60 | Temp 98.3°F | Wt 217.0 lb

## 2024-07-04 DIAGNOSIS — R062 Wheezing: Secondary | ICD-10-CM

## 2024-07-04 DIAGNOSIS — R051 Acute cough: Secondary | ICD-10-CM

## 2024-07-04 LAB — POC COVID19 BINAXNOW: SARS Coronavirus 2 Ag: NEGATIVE

## 2024-07-04 MED ORDER — IPRATROPIUM-ALBUTEROL 0.5-2.5 (3) MG/3ML IN SOLN
3.0000 mL | Freq: Once | RESPIRATORY_TRACT | Status: AC
  Administered 2024-07-04: 3 mL via RESPIRATORY_TRACT

## 2024-07-04 MED ORDER — IPRATROPIUM-ALBUTEROL 0.5-2.5 (3) MG/3ML IN SOLN
3.0000 mL | RESPIRATORY_TRACT | Status: AC
  Administered 2024-07-04: 3 mL via RESPIRATORY_TRACT

## 2024-07-04 MED ORDER — PREDNISONE 20 MG PO TABS: 20.0000 mg | ORAL_TABLET | Freq: Every day | ORAL | 0 refills | Status: AC

## 2024-07-04 MED ORDER — AMOXICILLIN-POT CLAVULANATE 875-125 MG PO TABS: 1.0000 | ORAL_TABLET | Freq: Two times a day (BID) | ORAL | 0 refills | Status: AC

## 2024-07-04 MED ORDER — BENZONATATE 100 MG PO CAPS: 100.0000 mg | ORAL_CAPSULE | Freq: Two times a day (BID) | ORAL | 0 refills | Status: AC | PRN

## 2024-07-04 NOTE — Patient Instructions (Addendum)
 Return in about 2 weeks (around 07/18/2024), or if symptoms worsen or fail to improve, for With PCP.        Great to see you today.  I have refilled the medication(s) we provide.   If labs were collected or images ordered, we will inform you of  results once we have received them and reviewed. We will contact you either by echart message, or telephone call.  Please give ample time to the testing facility, and our office to run,  receive and review results. Please do not call inquiring of results, even if you can see them in your chart. We will contact you as soon as we are able. If it has been over 1 week since the test was completed, and you have not yet heard from us , then please call us .    - echart message- for normal results that have been seen by the patient already.   - telephone call: abnormal results or if patient has not viewed results in their echart.  If a referral to a specialist was entered for you, please call us  in 2 weeks if you have not heard from the specialist office to schedule.

## 2024-07-10 DIAGNOSIS — L570 Actinic keratosis: Secondary | ICD-10-CM | POA: Diagnosis not present

## 2024-07-10 DIAGNOSIS — L821 Other seborrheic keratosis: Secondary | ICD-10-CM | POA: Diagnosis not present

## 2024-07-10 DIAGNOSIS — D485 Neoplasm of uncertain behavior of skin: Secondary | ICD-10-CM | POA: Diagnosis not present

## 2024-07-10 DIAGNOSIS — D225 Melanocytic nevi of trunk: Secondary | ICD-10-CM | POA: Diagnosis not present

## 2024-07-28 DIAGNOSIS — Z936 Other artificial openings of urinary tract status: Secondary | ICD-10-CM | POA: Diagnosis not present

## 2024-07-28 DIAGNOSIS — Z8551 Personal history of malignant neoplasm of bladder: Secondary | ICD-10-CM | POA: Diagnosis not present

## 2024-08-03 DIAGNOSIS — C44629 Squamous cell carcinoma of skin of left upper limb, including shoulder: Secondary | ICD-10-CM | POA: Diagnosis not present

## 2024-08-29 DIAGNOSIS — Z8551 Personal history of malignant neoplasm of bladder: Secondary | ICD-10-CM | POA: Diagnosis not present

## 2024-08-29 DIAGNOSIS — Z936 Other artificial openings of urinary tract status: Secondary | ICD-10-CM | POA: Diagnosis not present

## 2024-09-05 ENCOUNTER — Encounter: Payer: Self-pay | Admitting: Family Medicine

## 2024-09-05 ENCOUNTER — Ambulatory Visit: Admitting: Family Medicine

## 2024-09-05 VITALS — BP 130/80 | HR 50 | Temp 97.7°F | Wt 216.2 lb

## 2024-09-05 DIAGNOSIS — I10 Essential (primary) hypertension: Secondary | ICD-10-CM

## 2024-09-05 DIAGNOSIS — G894 Chronic pain syndrome: Secondary | ICD-10-CM

## 2024-09-05 DIAGNOSIS — M5441 Lumbago with sciatica, right side: Secondary | ICD-10-CM | POA: Diagnosis not present

## 2024-09-05 DIAGNOSIS — G8929 Other chronic pain: Secondary | ICD-10-CM

## 2024-09-05 DIAGNOSIS — M5442 Lumbago with sciatica, left side: Secondary | ICD-10-CM | POA: Diagnosis not present

## 2024-09-05 DIAGNOSIS — R7301 Impaired fasting glucose: Secondary | ICD-10-CM | POA: Diagnosis not present

## 2024-09-05 DIAGNOSIS — E7800 Pure hypercholesterolemia, unspecified: Secondary | ICD-10-CM

## 2024-09-05 MED ORDER — TRAMADOL HCL 50 MG PO TABS: ORAL_TABLET | ORAL | 5 refills | Status: AC

## 2024-09-05 NOTE — Progress Notes (Signed)
 OFFICE VISIT  09/05/2024  CC:  Chief Complaint  Patient presents with   Follow-up    6 month follow up. No other concerns.    Patient is a 83 y.o. male who presents for 3-month follow-up hypertension, hypercholesterolemia, and chronic pain syndrome. A/P as of last visit:  Hypertension, well-controlled on Lopressor  12.5 mg twice daily. Electrolytes and creatinine today.   #3 Hypercholesterolemia, doing well on rosuvastatin  40 mg a day. Lipid panel and hepatic panel today.   3 chronic pain syndrome.  Bilateral low back pain with intermittent bilateral sciatica. Well-controlled using tramadol  50 mg 1-3 times a day. Controlled substance contract and urine drug screen are up-to-date  INTERIM HX: Brandon Haynes is doing great. No acute concerns. Blood pressures at home sometimes in the 120s over 70s.  Sometimes systolic goes up into the 150s. He rarely feels any sensation that his blood pressure is high. He does sometimes feel little bit tired and lightheaded when very active.  Chronic pain: Lumbar degenerative disc disease with intermittent right-sided sciatica.  Has received epidural steroid injections in the past and these have been somewhat helpful.  I have prescribed him tramadol  and small amounts long-term and he has done well with this. States pain is well controlled and his medication allows him to be functional. PMP AWARE reviewed today: most recent rx for tramadol  50 mg was filled 08/09/2024, # 90, rx by me. No red flags.   Past Medical History:  Diagnosis Date   Allergic rhinitis    allergy shots via [place]   Anxiety    Bladder cancer (HCC) 05/2019   pTisN0Mx.  Lesion resected. No mets.  He got neoadjuvant chemo x 4 cycles.  Cystoprostectomy with ileal conduit formation 12/13/19 (Dr. Alvaro). 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 difficile colitis 01/09/2020; late May 2021   oral vancomycin    CAD (coronary artery disease)     mid LAD stent 2016; CABG 04/2021   CAD, multiple vessel    a. CP/near-syncope 11/2014 s/p DES to MLAD, occluded RCA after takeoff of RV marginal with collaterals treated medically, otherwise mild nonobstructive disease. EF 55%;  b. 07/2015 Lexiscan  CL: EF 56%, no ischemia/infarct.    Cataracts, both eyes    Chest pain 04/30/2021   Chronic constipation    worse since ileal conduit surgery was done 2020   Chronic low back pain    Colitis 02/19/2020   COLONIC POLYPS 03/29/2008   Qualifier: Diagnosis of   By: Latisha CMA, Leigh         COVID-19 virus infection 05/12/2023   DDD (degenerative disc disease), lumbar    Dizziness 08/23/2015   GERD (gastroesophageal reflux disease)    PPI bid needed to control sx's   Gout    Hearing loss    History  of basal cell carcinoma    History of adenomatous polyp of colon    History of prostate cancer 11/2019   grd 2 cancer with neg margins incidental on cystectomy path 11/2019.   Hypercholesteremia    Hypertension    Hx labile/uncontrolled.  Improved s/p visit to HTN clinic 2019.   Iron deficiency anemia 2017   Colonoscopy, EGD, and capsule endoscopy w/out obvious cause/source found.  Oral iron helping as of 03/2016.   Leg pain 03/06/2016   Osteoarthritis of multiple joints 10/18/2014   Peripheral vascular disease    Pleural thickening    chronic   Rash 08/23/2015   Renal neoplasm 10/2019  Small, left kidney: <2 cm, 90% endophytic by CT 04/2019, stable 10/2019. No avid enhancement or mass effect. Obs by urol as of 10/2019 and 11/2020.   Rotator cuff tear 08/28/2013   Sepsis (HCC) 02/20/2020    Past Surgical History:  Procedure Laterality Date   back injection  oct 2011, 07/2010, 12/2012   no surgery   Capsule endoscopy  02/2016   mild duodenitis, o/w NEG   CARDIOVASCULAR STRESS TEST  05/2009; 07/2015   07/2023 low risk lexiscan    carotid dopplers  11/2014   Bilateral - 1% to 9% ICA stenosis. Vertebral artery flow is   CATARACT EXTRACTION,  BILATERAL     Jan, Feb 2022   COLONOSCOPY  08/2008; 02/2016; 02/2017; 05/27/20   2017: 12 polyps (adenomatous).  02/2017 repeat TCS--multiple polyps. 04/2020 +adenomas.   CORONARY ANGIOPLASTY WITH STENT PLACEMENT  12/04/2014   4.0 x 12 mm Synergy stent to the mid LAD   CORONARY ARTERY BYPASS GRAFT N/A 05/06/2021   Procedure: OFF PUMP CORONARY ARTERY BYPASS GRAFTING (CABG) X TWO, USING LEFT INTERNAL MAMMARY ARTERY AND RIGHT LEG GREATER SAPHENOUS VEIN HARVESTED ENDOSCOPICALLY;  Surgeon: Shyrl Linnie KIDD, MD;  Location: [facility] OR;  Service: Open Heart Surgery;  Laterality: N/A;   CYSTOSCOPY  05/24/2019   CYSTOSCOPY W/ TRANSURETHRAL RESECTION OF POSTERIOR URETHERAL VALVES  06/12/2019   2-5cm   CYSTOSCOPY WITH INJECTION N/A 12/13/2019   Procedure: CYSTOSCOPY WITH INJECTION;  Surgeon: Alvaro Hummer, MD;  Location: [facility];  Service: Urology;  Laterality: N/A;   ESOPHAGOGASTRODUODENOSCOPY  02/2016   Gastritis.  NEG h pylori.     IR IMAGING GUIDED PORT INSERTION  09/12/2019   IR REMOVAL TUN ACCESS W/ PORT W/O FL MOD SED  08/20/2020   LE arterial vascular study  02/2016   Per Dr. Shlomo, no evidence of peripheral vascular disease   LEFT HEART CATH AND CORONARY ANGIOGRAPHY N/A 05/01/2021   Procedure: LEFT HEART CATH AND CORONARY ANGIOGRAPHY;  Surgeon: Verlin Lonni BIRCH, MD;  Location: [facility];  Service: Cardiovascular;  Laterality: N/A;   LEFT HEART CATHETERIZATION WITH CORONARY ANGIOGRAM N/A 12/04/2014   Procedure: LEFT HEART CATHETERIZATION WITH CORONARY ANGIOGRAM;  Surgeon: Toribio JONELLE Fuel, MD; LAD 99% then aneurysmal, OM1 40%, CFX 50%, RCA 40%, 50% then occluded   LYMPHADENECTOMY Bilateral 12/13/2019   Procedure: LYMPHADENECTOMY;  Surgeon: Alvaro Hummer, MD;  Location: [facility];  Service: Urology;  Laterality: Bilateral;   ROBOT ASSISTED LAPAROSCOPIC COMPLETE CYSTECT ILEAL CONDUIT N/A 12/13/2019   Procedure: XI ROBOTIC ASSISTED LAPAROSCOPIC COMPLETE CYSTECT ILEAL CONDUIT AND  RADICAL PROSTATECTOMY;  Surgeon: Alvaro Hummer, MD;  Location: [facility];  Service: Urology;  Laterality: N/A;  6 HRS   ROTATOR CUFF REPAIR Left 09/2013   left   SHOULDER INJECTION  10/2012   TEE WITHOUT CARDIOVERSION N/A 05/06/2021   Procedure: TRANSESOPHAGEAL ECHOCARDIOGRAM (TEE);  Surgeon: Shyrl Linnie KIDD, MD;  Location: [facility] OR;  Service: Open Heart Surgery;  Laterality: N/A;   TRANSTHORACIC ECHOCARDIOGRAM  11/2014   EF 50-55%, some wall motion abnormalities noted, grade I diast dysfxn   TRANSTHORACIC ECHOCARDIOGRAM  05/01/2021   04/2021 pre CABG, EF 55-50%, nl wm, grd I DD, valves fine.  07/2023 grd I DD, o/w good   TRANSURETHRAL RESECTION OF BLADDER TUMOR N/A 06/12/2019   INVASIVE HIGH GRADE PAPILLARY UROTHELIAL CARCINOMA, invading muscularis propria.  Procedure: TRANSURETHRAL RESECTION OF BLADDER TUMOR (TURBT);  Surgeon: Carolee Sherwood BIRCH DOUGLAS, MD;  Location: [facility];  Service: Urology;  Laterality: N/A;    Outpatient  Medications Prior to Visit  Medication Sig Dispense Refill   acetaminophen  (TYLENOL ) 650 MG CR tablet Take 650 mg by mouth every 8 (eight) hours as needed for pain.     allopurinol  (ZYLOPRIM ) 300 MG tablet Take 1 tablet (300 mg total) by mouth daily. 90 tablet 3   ammonium lactate (LAC-HYDRIN) 12 % lotion APPLY TWICE DAILY TO ARMS AND HANDS TO INCREASE MOISTURE AND REDUCE EASY BRUSING. IT CAN ALSO BE USED SAFELY ON NECK, CHEST AND LEGS IF DESIRED. FACIAL USE IS FINE AS WELL, BUT THE SCENT IS SOMETIMES BOTHERSOME     Ascorbic Acid (VITAMIN C PO) Take by mouth daily.     aspirin  EC 81 MG tablet Take 81 mg by mouth daily.     Cholecalciferol (D3 PO) Take 1 capsule by mouth daily.     clopidogrel  (PLAVIX ) 75 MG tablet TAKE 1 TABLET BY MOUTH EVERY DAY 90 tablet 3   hydrocortisone 2.5 % cream Apply topically 2 (two) times daily.     loratadine  (CLARITIN ) 10 MG tablet Take 20 mg by mouth daily.     metoprolol  tartrate (LOPRESSOR ) 25 MG tablet TAKE 1/2 TABLET TWICE A DAY BY MOUTH 90  tablet 3   pantoprazole  (PROTONIX ) 40 MG tablet TAKE 1 TABLET BY MOUTH EVERY DAY BEFORE BREAKFAST 90 tablet 1   rosuvastatin  (CRESTOR ) 40 MG tablet TAKE 1 TABLET BY MOUTH EVERY DAY 90 tablet 3   triamcinolone  (NASACORT ) 55 MCG/ACT AERO nasal inhaler Place 1 spray into the nose 2 (two) times daily as needed (allergies).     traMADol  (ULTRAM ) 50 MG tablet TAKE 1 TABLET BY MOUTH THREE TIMES A DAY AS NEEDED FOR PAIN 90 tablet 5   amoxicillin -clavulanate (AUGMENTIN ) 875-125 MG tablet Take 1 tablet by mouth 2 (two) times daily. (Patient not taking: Reported on 09/05/2024) 20 tablet 0   benzonatate  (TESSALON ) 100 MG capsule Take 1 capsule (100 mg total) by mouth 2 (two) times daily as needed for cough. (Patient not taking: Reported on 09/05/2024) 20 capsule 0   predniSONE  (DELTASONE ) 20 MG tablet Take 1 tablet (20 mg total) by mouth daily with breakfast. (Patient not taking: Reported on 09/05/2024) 5 tablet 0   No facility-administered medications prior to visit.    No Known Allergies  Review of Systems As per HPI  PE:    09/05/2024    9:52 AM 07/04/2024   11:37 AM 07/04/2024   11:30 AM  Vitals with BMI  Weight 216 lbs 3 oz  217 lbs  Systolic 156 129 871  Diastolic 85 79 82  Pulse 50  60     Physical Exam  General: Alert and well-appearing. Affect: Pleasant, lucid thought and speech. Cardiovascular: Regular rhythm and rate without murmur. Lungs are clear bilaterally, breathing nonlabored. Extremities: No edema.  LABS:  Last CBC Lab Results  Component Value Date   WBC 7.8 05/01/2024   HGB 14.4 05/01/2024   HCT 42.9 05/01/2024   MCV 94.5 05/01/2024   MCH 31.7 05/01/2024   RDW 13.6 05/01/2024   PLT 157 05/01/2024   Last metabolic panel Lab Results  Component Value Date   GLUCOSE 110 (H) 05/01/2024   NA 143 05/01/2024   K 4.3 05/01/2024   CL 106 05/01/2024   CO2 25 05/01/2024   BUN 14 05/01/2024   CREATININE 1.10 05/01/2024   EGFR 67 05/01/2024   CALCIUM  9.1 05/01/2024    PHOS 4.0 02/21/2020   PROT 6.5 05/01/2024   ALBUMIN  4.4 08/11/2021   BILITOT 0.7  05/01/2024   ALKPHOS 53 08/11/2021   AST 19 05/01/2024   ALT 12 05/01/2024   ANIONGAP 8 05/09/2021   Last lipids Lab Results  Component Value Date   CHOL 122 03/06/2024   HDL 69 03/06/2024   LDLCALC 34 03/06/2024   TRIG 106 03/06/2024   CHOLHDL 1.8 03/06/2024   Last hemoglobin A1c Lab Results  Component Value Date   HGBA1C 5.7 (H) 05/06/2021   Last thyroid  functions Lab Results  Component Value Date   TSH 1.97 07/05/2023   T3TOTAL 87 07/05/2023   FREET4 1.0 07/05/2023   Last vitamin B12 and Folate Lab Results  Component Value Date   VITAMINB12 387 07/05/2023   FOLATE 17.4 02/10/2016   IMPRESSION AND PLAN:  Hypertension, well-controlled on Lopressor  12.5 mg twice daily. Electrolytes and creatinine today.   #3 Hypercholesterolemia, doing well on rosuvastatin  40 mg a day. Lipid panel and hepatic panel today.   3 chronic pain syndrome.  Bilateral low back pain with intermittent bilateral sciatica. Well-controlled using tramadol  50 mg 1-3 times a day. Controlled substance contract and urine drug screen are up-to-date.  #4 impaired fasting glucose. Fasting glucose and hemoglobin A1c today.  An After Visit Summary was printed and given to the patient.  FOLLOW UP: Return in about 6 months (around 03/06/2025) for annual CPE (fasting). Next CPE June 2026 Signed:  Gerlene Hockey, MD           09/05/2024

## 2024-09-06 ENCOUNTER — Ambulatory Visit: Payer: Self-pay | Admitting: Family Medicine

## 2024-09-06 LAB — LIPID PANEL
Cholesterol: 121 mg/dL (ref <200)
HDL: 65 mg/dL (ref >=40)
LDL Cholesterol (Calc): 34 mg/dL
Non-HDL Cholesterol (Calc): 56 mg/dL (ref <130)
Total CHOL/HDL Ratio: 1.9 (calc) (ref <5.0)
Triglycerides: 137 mg/dL (ref <150)

## 2024-09-06 LAB — HEMOGLOBIN A1C
Hgb A1c MFr Bld: 5.5 % (ref <5.7)
Mean Plasma Glucose: 111 mg/dL
eAG (mmol/L): 6.2 mmol/L

## 2024-09-06 LAB — BASIC METABOLIC PANEL WITH GFR
BUN: 13 mg/dL (ref 7–25)
CO2: 29 mmol/L (ref 20–32)
Calcium: 9.6 mg/dL (ref 8.6–10.3)
Chloride: 106 mmol/L (ref 98–110)
Creat: 1.11 mg/dL (ref 0.70–1.22)
Glucose, Bld: 90 mg/dL (ref 65–99)
Potassium: 4.3 mmol/L (ref 3.5–5.3)
Sodium: 144 mmol/L (ref 135–146)
eGFR: 67 mL/min/1.73m2 (ref >=60)

## 2024-09-15 DIAGNOSIS — H35373 Puckering of macula, bilateral: Secondary | ICD-10-CM | POA: Diagnosis not present

## 2024-09-15 DIAGNOSIS — H43812 Vitreous degeneration, left eye: Secondary | ICD-10-CM | POA: Diagnosis not present

## 2024-09-15 DIAGNOSIS — Z961 Presence of intraocular lens: Secondary | ICD-10-CM | POA: Diagnosis not present

## 2024-09-15 DIAGNOSIS — H04123 Dry eye syndrome of bilateral lacrimal glands: Secondary | ICD-10-CM | POA: Diagnosis not present

## 2024-09-29 ENCOUNTER — Encounter: Payer: Self-pay | Admitting: Oncology

## 2024-10-09 ENCOUNTER — Ambulatory Visit: Admitting: Podiatry

## 2024-10-09 ENCOUNTER — Encounter: Payer: Self-pay | Admitting: Podiatry

## 2024-10-09 DIAGNOSIS — B351 Tinea unguium: Secondary | ICD-10-CM | POA: Diagnosis not present

## 2024-10-09 DIAGNOSIS — M79671 Pain in right foot: Secondary | ICD-10-CM | POA: Diagnosis not present

## 2024-10-09 DIAGNOSIS — M79672 Pain in left foot: Secondary | ICD-10-CM

## 2024-10-09 DIAGNOSIS — L600 Ingrowing nail: Secondary | ICD-10-CM | POA: Diagnosis not present

## 2024-10-09 MED ORDER — CICLOPIROX 8 % EX SOLN: Freq: Every day | CUTANEOUS | 9 refills | Status: AC

## 2024-10-09 NOTE — Progress Notes (Signed)
 Patient presents for evaluation and treatment of tenderness and some redness around nails feet.  Tenderness around toes with walking and wearing shoes.  Has noticed some dried blood along the edge of the nail on the hallux right.  Has not noticed any drainage.  Physical exam:  General appearance: Alert, pleasant, and in no acute distress.  Vascular: Pedal pulses: DP 2/4 B/L, PT 0/4 B/L.  Moderate edema lower legs bilaterally.  Capillary refill time immediate bilaterally  Neurologic: Light touch intact.  Normal Achilles tendon reflex.  No clonus or spasticity noted.  Dermatologic:  Nails thickened, disfigured, discolored 1-5 BL with subungual debris.  Redness and hypertrophic nail folds along nail folds bilaterally but no signs of drainage or infection.  No hair growth lower extremity bilaterally.  Skin atrophy lower extremity bilaterally.  Hallux nail right has some dried blood along the edge of the nail.  No signs of infection or open wound.  Musculoskeletal:  Hammertoes 2 through 5 bilaterally.  Normal muscle strength lower extremity bilaterally.   Diagnosis: 1. Painful onychomycotic nails 1 through 5 bilaterally. 2. Pain toes 1 through 5 bilaterally. 3.  Ingrown nail hallux right  Plan: -New patient office visit for evaluation and management level 3.  Modifier 25. - Discussed with him the ingrown nail.  Keep an eye on this if it does get worse we may need to avulsed the nail border.  I think it be best just to keep the nails trimmed down.  Also have him try some topical Penlac  on the nails for the onychomycosis. -Rx Penlac  solution, apply daily to affected nails feet, 9 refills  -Debrided onychomycotic nails 1 through 5 bilaterally.  Sharply debrided nails with nail clipper and reduced with a power bur.  Return 3 months RFC

## 2024-10-18 ENCOUNTER — Other Ambulatory Visit: Payer: Self-pay | Admitting: Cardiology

## 2024-10-18 ENCOUNTER — Other Ambulatory Visit: Payer: Self-pay | Admitting: Family Medicine

## 2024-10-18 ENCOUNTER — Other Ambulatory Visit: Payer: Self-pay | Admitting: Gastroenterology

## 2024-10-18 DIAGNOSIS — I25118 Atherosclerotic heart disease of native coronary artery with other forms of angina pectoris: Secondary | ICD-10-CM

## 2025-01-09 ENCOUNTER — Ambulatory Visit: Admitting: Podiatry

## 2025-03-06 ENCOUNTER — Encounter: Admitting: Family Medicine

## 2025-03-08 ENCOUNTER — Ambulatory Visit: Admitting: Cardiology
